# Patient Record
Sex: Male | Born: 1950 | Race: Black or African American | Hispanic: No | Marital: Single | Smoking: Current every day smoker
Health system: Southern US, Community
[De-identification: ages and names within clinical notes are randomized; demographics above are authoritative.]

## PROBLEM LIST (undated history)

## (undated) DIAGNOSIS — F1020 Alcohol dependence, uncomplicated: Secondary | ICD-10-CM

## (undated) DIAGNOSIS — J449 Chronic obstructive pulmonary disease, unspecified: Secondary | ICD-10-CM

## (undated) DIAGNOSIS — I10 Essential (primary) hypertension: Secondary | ICD-10-CM

## (undated) DIAGNOSIS — K219 Gastro-esophageal reflux disease without esophagitis: Secondary | ICD-10-CM

## (undated) DIAGNOSIS — C3490 Malignant neoplasm of unspecified part of unspecified bronchus or lung: Secondary | ICD-10-CM

## (undated) DIAGNOSIS — H9190 Unspecified hearing loss, unspecified ear: Secondary | ICD-10-CM

## (undated) DIAGNOSIS — R0902 Hypoxemia: Secondary | ICD-10-CM

## (undated) DIAGNOSIS — E559 Vitamin D deficiency, unspecified: Secondary | ICD-10-CM

## (undated) DIAGNOSIS — I639 Cerebral infarction, unspecified: Secondary | ICD-10-CM

## (undated) DIAGNOSIS — R569 Unspecified convulsions: Secondary | ICD-10-CM

## (undated) DIAGNOSIS — E7800 Pure hypercholesterolemia, unspecified: Secondary | ICD-10-CM

## (undated) DIAGNOSIS — Z5189 Encounter for other specified aftercare: Secondary | ICD-10-CM

## (undated) DIAGNOSIS — T7840XA Allergy, unspecified, initial encounter: Secondary | ICD-10-CM

## (undated) HISTORY — DX: Hypoxemia: R09.02

## (undated) HISTORY — PX: OTHER SURGICAL HISTORY: SHX169

## (undated) HISTORY — PX: ESOPHAGOGASTRODUODENOSCOPY: SHX1529

## (undated) HISTORY — PX: COLON SURGERY: SHX602

## (undated) HISTORY — DX: Encounter for other specified aftercare: Z51.89

## (undated) HISTORY — PX: CARDIAC VALVE REPLACEMENT: SHX585

## (undated) HISTORY — DX: Allergy, unspecified, initial encounter: T78.40XA

---

## 2008-04-18 ENCOUNTER — Other Ambulatory Visit: Payer: Self-pay

## 2008-04-18 ENCOUNTER — Inpatient Hospital Stay: Payer: Self-pay | Admitting: Internal Medicine

## 2008-04-22 ENCOUNTER — Other Ambulatory Visit: Payer: Self-pay

## 2008-04-22 ENCOUNTER — Inpatient Hospital Stay: Payer: Self-pay | Admitting: Internal Medicine

## 2008-07-11 ENCOUNTER — Ambulatory Visit: Payer: Self-pay | Admitting: Vascular Surgery

## 2011-07-19 ENCOUNTER — Ambulatory Visit: Payer: Self-pay | Admitting: Unknown Physician Specialty

## 2011-07-21 LAB — PATHOLOGY REPORT

## 2011-08-04 ENCOUNTER — Ambulatory Visit: Payer: Self-pay | Admitting: Surgery

## 2011-08-06 ENCOUNTER — Ambulatory Visit: Payer: Self-pay | Admitting: Anesthesiology

## 2011-08-11 ENCOUNTER — Inpatient Hospital Stay: Payer: Self-pay | Admitting: Surgery

## 2011-12-06 ENCOUNTER — Ambulatory Visit: Payer: Self-pay

## 2011-12-30 ENCOUNTER — Ambulatory Visit: Payer: Self-pay | Admitting: Internal Medicine

## 2012-01-26 ENCOUNTER — Ambulatory Visit: Payer: Self-pay | Admitting: Internal Medicine

## 2012-01-26 LAB — PROTIME-INR: Prothrombin Time: 13 secs (ref 11.5–14.7)

## 2012-01-26 LAB — PLATELET COUNT: Platelet: 369 10*3/uL (ref 150–440)

## 2012-02-03 LAB — PATHOLOGY REPORT

## 2012-02-15 ENCOUNTER — Ambulatory Visit: Payer: Self-pay | Admitting: Oncology

## 2012-02-21 ENCOUNTER — Ambulatory Visit: Payer: Self-pay | Admitting: Oncology

## 2012-03-15 ENCOUNTER — Other Ambulatory Visit: Payer: Self-pay

## 2012-03-15 ENCOUNTER — Other Ambulatory Visit: Payer: Self-pay | Admitting: Cardiothoracic Surgery

## 2012-03-15 DIAGNOSIS — C3490 Malignant neoplasm of unspecified part of unspecified bronchus or lung: Secondary | ICD-10-CM

## 2012-03-23 ENCOUNTER — Ambulatory Visit: Payer: Self-pay | Admitting: Oncology

## 2012-03-30 ENCOUNTER — Ambulatory Visit
Admission: RE | Admit: 2012-03-30 | Discharge: 2012-03-30 | Disposition: A | Discharge status: Home / Self Care | Payer: Medicaid Other | Source: Ambulatory Visit | Attending: Cardiothoracic Surgery | Admitting: Cardiothoracic Surgery

## 2012-03-30 DIAGNOSIS — C3490 Malignant neoplasm of unspecified part of unspecified bronchus or lung: Secondary | ICD-10-CM

## 2012-04-20 ENCOUNTER — Other Ambulatory Visit (HOSPITAL_COMMUNITY): Payer: Self-pay | Admitting: Interventional Radiology

## 2012-04-20 DIAGNOSIS — R918 Other nonspecific abnormal finding of lung field: Secondary | ICD-10-CM

## 2012-04-25 ENCOUNTER — Other Ambulatory Visit (HOSPITAL_COMMUNITY): Payer: Medicaid Other

## 2012-04-26 ENCOUNTER — Other Ambulatory Visit: Payer: Self-pay | Admitting: Radiology

## 2012-04-28 ENCOUNTER — Ambulatory Visit (HOSPITAL_COMMUNITY)
Admission: RE | Admit: 2012-04-28 | Discharge: 2012-04-28 | Disposition: A | Discharge status: Home / Self Care | Payer: Medicaid Other | Source: Ambulatory Visit | Attending: Interventional Radiology | Admitting: Interventional Radiology

## 2012-04-28 ENCOUNTER — Encounter (HOSPITAL_COMMUNITY): Payer: Self-pay

## 2012-04-28 ENCOUNTER — Encounter (HOSPITAL_COMMUNITY)
Admission: RE | Admit: 2012-04-28 | Discharge: 2012-04-28 | Disposition: A | Discharge status: Home / Self Care | Payer: Medicaid Other | Source: Ambulatory Visit | Attending: Interventional Radiology | Admitting: Interventional Radiology

## 2012-04-28 ENCOUNTER — Encounter (HOSPITAL_COMMUNITY): Payer: Self-pay | Admitting: Pharmacy Technician

## 2012-04-28 DIAGNOSIS — R222 Localized swelling, mass and lump, trunk: Secondary | ICD-10-CM | POA: Insufficient documentation

## 2012-04-28 DIAGNOSIS — Z01812 Encounter for preprocedural laboratory examination: Secondary | ICD-10-CM | POA: Insufficient documentation

## 2012-04-28 DIAGNOSIS — M948X9 Other specified disorders of cartilage, unspecified sites: Secondary | ICD-10-CM | POA: Insufficient documentation

## 2012-04-28 DIAGNOSIS — R918 Other nonspecific abnormal finding of lung field: Secondary | ICD-10-CM

## 2012-04-28 DIAGNOSIS — I2510 Atherosclerotic heart disease of native coronary artery without angina pectoris: Secondary | ICD-10-CM | POA: Insufficient documentation

## 2012-04-28 DIAGNOSIS — Z01810 Encounter for preprocedural cardiovascular examination: Secondary | ICD-10-CM | POA: Insufficient documentation

## 2012-04-28 DIAGNOSIS — R911 Solitary pulmonary nodule: Secondary | ICD-10-CM | POA: Insufficient documentation

## 2012-04-28 HISTORY — DX: Malignant neoplasm of unspecified part of unspecified bronchus or lung: C34.90

## 2012-04-28 HISTORY — DX: Pure hypercholesterolemia, unspecified: E78.00

## 2012-04-28 HISTORY — DX: Chronic obstructive pulmonary disease, unspecified: J44.9

## 2012-04-28 HISTORY — DX: Cerebral infarction, unspecified: I63.9

## 2012-04-28 HISTORY — DX: Unspecified convulsions: R56.9

## 2012-04-28 HISTORY — DX: Essential (primary) hypertension: I10

## 2012-04-28 LAB — CBC
HCT: 40.4 % (ref 39.0–52.0)
MCH: 30.4 pg (ref 26.0–34.0)
MCHC: 34.4 g/dL (ref 30.0–36.0)
MCV: 88.4 fL (ref 78.0–100.0)
RDW: 13.8 % (ref 11.5–15.5)

## 2012-04-28 LAB — BASIC METABOLIC PANEL
BUN: 6 mg/dL (ref 6–23)
Chloride: 99 mEq/L (ref 96–112)
Creatinine, Ser: 0.56 mg/dL (ref 0.50–1.35)
GFR calc Af Amer: >90 mL/min (ref >90)
Glucose, Bld: 104 mg/dL — ABNORMAL HIGH (ref 70–99)

## 2012-04-28 NOTE — Patient Instructions (Signed)
YOUR SURGERY IS SCHEDULED ON:  Friday  9/13  AT 8:00 AM  REPORT TO [facility] AT:  6:00 AM      PHONE # FOR SHORT STAY IS [phone]  STOP PLAVIX  TODAY -Friday  04/28/12 STOP ASPIRIN Monday  05/01/12 ABOVE INSTRUCTIONS FOR STOPPING PLAVIX AND ASPIRIN FROM TINA IN INTERVENTIONAL RADIOLOGY FOR DR. YAMAGATA.  DO NOT EAT OR DRINK ANYTHING AFTER MIDNIGHT THE NIGHT BEFORE YOUR SURGERY.  YOU MAY BRUSH YOUR TEETH, RINSE OUT YOUR MOUTH--BUT NO WATER, NO FOOD, NO CHEWING GUM, NO MINTS, NO CANDIES, NO CHEWING TOBACCO.  PLEASE TAKE THE FOLLOWING MEDICATIONS THE AM OF YOUR SURGERY WITH A FEW SIPS OF WATER: PANTOPRAZOLE    DO NOT BRING VALUABLES, MONEY, CREDIT CARDS.  CONTACT LENS, DENTURES / PARTIALS, GLASSES SHOULD NOT BE WORN TO SURGERY AND IN MOST CASES-HEARING AIDS WILL NEED TO BE REMOVED.  BRING YOUR GLASSES CASE, ANY EQUIPMENT NEEDED FOR YOUR CONTACT LENS. FOR PATIENTS ADMITTED TO THE HOSPITAL--CHECK OUT TIME THE DAY OF DISCHARGE IS 11:00 AM.  ALL INPATIENT ROOMS ARE PRIVATE - WITH BATHROOM, TELEPHONE, TELEVISION AND WIFI INTERNET. IF YOU ARE BEING DISCHARGED THE SAME DAY OF YOUR SURGERY--YOU CAN NOT DRIVE YOURSELF HOME--AND SHOULD NOT GO HOME ALONE BY TAXI OR BUS.  NO DRIVING OR OPERATING MACHINERY FOR 24 HOURS FOLLOWING ANESTHESIA / PAIN MEDICATIONS.                            SPECIAL INSTRUCTIONS:  CHLORHEXIDINE SOAP SHOWER (other brand names are Betasept and Hibiclens ) PLEASE SHOWER WITH CHLORHEXIDINE THE NIGHT BEFORE YOUR SURGERY AND THE AM OF YOUR SURGERY. DO NOT USE CHLORHEXIDINE ON YOUR FACE OR PRIVATE AREAS--YOU MAY USE YOUR NORMAL SOAP THOSE AREAS AND YOUR NORMAL SHAMPOO.  WOMEN SHOULD AVOID SHAVING UNDER ARMS AND SHAVING LEGS 48 HOURS BEFORE USING CHLORHEXIDINE TO AVOID SKIN IRRITATION.  DO NOT USE IF ALLERGIC TO CHLORHEXIDINE.  PLEASE READ OVER ANY  FACT SHEETS THAT YOU WERE GIVEN:  BLOOD TRANSFUSION INFORMATION

## 2012-04-28 NOTE — Pre-Procedure Instructions (Signed)
PREOP CBC, BMET, PT, PTT WERE DONE TODAY AT [facility]. WE HAD REQUESTED ANY RECENT EKG REPORT FROM PT'S MEDICAL DOCTOR'S OFFICE AND FROM [facility]--THE ONLY EKG RECEIVED WAS FROM MEDICAL DOCTOR'S OFFICE -IT WAS FROM 2009.  EKG REPEATED TODAY PREOP.  PT IS HAVING CHEST CT THIS AFTERNOON AT [facility]. PREOP INSTRUCTIONS DISCUSSED WITH PT USING TEACH BACK METHOD. AFTER PT LEFT PRESURGICAL [facility] FAXED AN EKG REPORT FROM 08/06/11.

## 2012-05-01 ENCOUNTER — Other Ambulatory Visit: Payer: Self-pay | Admitting: Radiology

## 2012-05-05 ENCOUNTER — Encounter (HOSPITAL_COMMUNITY): Payer: Self-pay | Admitting: Anesthesiology

## 2012-05-05 ENCOUNTER — Ambulatory Visit (HOSPITAL_COMMUNITY): Payer: Medicaid Other

## 2012-05-05 ENCOUNTER — Ambulatory Visit (HOSPITAL_COMMUNITY)
Admission: RE | Admit: 2012-05-05 | Discharge: 2012-05-05 | Disposition: A | Discharge status: Home / Self Care | Payer: Medicaid Other | Source: Ambulatory Visit | Attending: Interventional Radiology | Admitting: Interventional Radiology

## 2012-05-05 ENCOUNTER — Ambulatory Visit (HOSPITAL_COMMUNITY): Payer: Medicaid Other | Admitting: Anesthesiology

## 2012-05-05 ENCOUNTER — Encounter (HOSPITAL_COMMUNITY)
Admission: RE | Disposition: A | Discharge status: Home / Self Care | Payer: Self-pay | Source: Ambulatory Visit | Attending: Interventional Radiology

## 2012-05-05 ENCOUNTER — Encounter (HOSPITAL_COMMUNITY): Payer: Self-pay | Admitting: *Deleted

## 2012-05-05 ENCOUNTER — Encounter (HOSPITAL_COMMUNITY): Payer: Self-pay

## 2012-05-05 ENCOUNTER — Observation Stay (HOSPITAL_COMMUNITY)
Admission: RE | Admit: 2012-05-05 | Discharge: 2012-05-07 | Disposition: A | Discharge status: Home / Self Care | Payer: Medicaid Other | Source: Ambulatory Visit | Attending: Interventional Radiology | Admitting: Interventional Radiology

## 2012-05-05 DIAGNOSIS — I10 Essential (primary) hypertension: Secondary | ICD-10-CM | POA: Insufficient documentation

## 2012-05-05 DIAGNOSIS — R918 Other nonspecific abnormal finding of lung field: Secondary | ICD-10-CM

## 2012-05-05 DIAGNOSIS — Z79899 Other long term (current) drug therapy: Secondary | ICD-10-CM | POA: Insufficient documentation

## 2012-05-05 DIAGNOSIS — J95811 Postprocedural pneumothorax: Secondary | ICD-10-CM | POA: Insufficient documentation

## 2012-05-05 DIAGNOSIS — Y849 Medical procedure, unspecified as the cause of abnormal reaction of the patient, or of later complication, without mention of misadventure at the time of the procedure: Secondary | ICD-10-CM | POA: Insufficient documentation

## 2012-05-05 DIAGNOSIS — C3410 Malignant neoplasm of upper lobe, unspecified bronchus or lung: Principal | ICD-10-CM | POA: Insufficient documentation

## 2012-05-05 SURGERY — RADIO FREQUENCY ABLATION
Anesthesia: General | Laterality: Left | Wound class: Clean

## 2012-05-05 MED ORDER — LIDOCAINE HCL (CARDIAC) 20 MG/ML IV SOLN
INTRAVENOUS | Status: DC | PRN
  Administered 2012-05-05: 50 mg via INTRAVENOUS

## 2012-05-05 MED ORDER — ALBUTEROL SULFATE HFA 108 (90 BASE) MCG/ACT IN AERS
INHALATION_SPRAY | RESPIRATORY_TRACT | Status: AC
  Filled 2012-05-05: qty 6.7

## 2012-05-05 MED ORDER — PROPOFOL 10 MG/ML IV BOLUS
INTRAVENOUS | Status: DC | PRN
  Administered 2012-05-05: 150 mg via INTRAVENOUS

## 2012-05-05 MED ORDER — ATORVASTATIN CALCIUM 40 MG PO TABS
40.0000 mg | ORAL_TABLET | Freq: Every day | ORAL | Status: DC
  Administered 2012-05-05 – 2012-05-06 (×2): 40 mg via ORAL
  Filled 2012-05-05 (×3): qty 1

## 2012-05-05 MED ORDER — ONDANSETRON HCL 4 MG/2ML IJ SOLN: 4.0000 mg | Freq: Four times a day (QID) | INTRAMUSCULAR | Status: DC | PRN

## 2012-05-05 MED ORDER — HYDROMORPHONE HCL PF 1 MG/ML IJ SOLN
INTRAMUSCULAR | Status: AC
  Filled 2012-05-05: qty 1

## 2012-05-05 MED ORDER — CEFAZOLIN SODIUM-DEXTROSE 2-3 GM-% IV SOLR
2.0000 g | INTRAVENOUS | Status: AC
  Administered 2012-05-05: 2 g via INTRAVENOUS
  Filled 2012-05-05: qty 50

## 2012-05-05 MED ORDER — HYDROCODONE-ACETAMINOPHEN 5-325 MG PO TABS
1.0000 | ORAL_TABLET | ORAL | Status: DC | PRN
  Administered 2012-05-05: 2 via ORAL
  Filled 2012-05-05: qty 2

## 2012-05-05 MED ORDER — MIDAZOLAM HCL 5 MG/5ML IJ SOLN
INTRAMUSCULAR | Status: DC | PRN
  Administered 2012-05-05: 1 mg via INTRAVENOUS
  Administered 2012-05-05: 2 mg via INTRAVENOUS
  Administered 2012-05-05: 1 mg via INTRAVENOUS

## 2012-05-05 MED ORDER — LACTATED RINGERS IV SOLN
INTRAVENOUS | Status: DC
  Administered 2012-05-05: 07:00:00 via INTRAVENOUS

## 2012-05-05 MED ORDER — ACETAMINOPHEN 10 MG/ML IV SOLN
INTRAVENOUS | Status: AC
  Filled 2012-05-05: qty 100

## 2012-05-05 MED ORDER — LORAZEPAM 2 MG/ML IJ SOLN
2.0000 mg | Freq: Once | INTRAMUSCULAR | Status: AC
  Administered 2012-05-05: 2 mg via INTRAVENOUS
  Filled 2012-05-05: qty 1

## 2012-05-05 MED ORDER — GLYCOPYRROLATE 0.2 MG/ML IJ SOLN
INTRAMUSCULAR | Status: DC | PRN
  Administered 2012-05-05: 0.3 mg via INTRAVENOUS

## 2012-05-05 MED ORDER — INFLUENZA VIRUS VACC SPLIT PF IM SUSP
0.5000 mL | INTRAMUSCULAR | Status: AC
  Administered 2012-05-06: 0.5 mL via INTRAMUSCULAR
  Filled 2012-05-05: qty 0.5

## 2012-05-05 MED ORDER — NEOSTIGMINE METHYLSULFATE 1 MG/ML IJ SOLN
INTRAMUSCULAR | Status: DC | PRN
  Administered 2012-05-05: 2 mg via INTRAVENOUS

## 2012-05-05 MED ORDER — PHENYTOIN SODIUM EXTENDED 100 MG PO CAPS
400.0000 mg | ORAL_CAPSULE | Freq: Every day | ORAL | Status: DC
  Administered 2012-05-05 – 2012-05-06 (×2): 400 mg via ORAL
  Filled 2012-05-05 (×3): qty 4

## 2012-05-05 MED ORDER — PANTOPRAZOLE SODIUM 40 MG PO TBEC
40.0000 mg | DELAYED_RELEASE_TABLET | Freq: Every day | ORAL | Status: DC
  Administered 2012-05-06 – 2012-05-07 (×2): 40 mg via ORAL
  Filled 2012-05-05 (×2): qty 1

## 2012-05-05 MED ORDER — ACETAMINOPHEN 10 MG/ML IV SOLN
INTRAVENOUS | Status: DC | PRN
  Administered 2012-05-05: 1000 mg via INTRAVENOUS

## 2012-05-05 MED ORDER — DOCUSATE SODIUM 100 MG PO CAPS
100.0000 mg | ORAL_CAPSULE | Freq: Two times a day (BID) | ORAL | Status: DC
  Administered 2012-05-05 – 2012-05-07 (×4): 100 mg via ORAL
  Filled 2012-05-05 (×5): qty 1

## 2012-05-05 MED ORDER — SENNOSIDES-DOCUSATE SODIUM 8.6-50 MG PO TABS
1.0000 | ORAL_TABLET | Freq: Every day | ORAL | Status: DC | PRN
  Filled 2012-05-05: qty 1

## 2012-05-05 MED ORDER — LACTATED RINGERS IV SOLN
INTRAVENOUS | Status: DC | PRN
  Administered 2012-05-05 (×2): via INTRAVENOUS

## 2012-05-05 MED ORDER — ONDANSETRON HCL 4 MG/2ML IJ SOLN
INTRAMUSCULAR | Status: DC | PRN
  Administered 2012-05-05: 4 mg via INTRAVENOUS

## 2012-05-05 MED ORDER — HYDRALAZINE HCL 20 MG/ML IJ SOLN
5.0000 mg | Freq: Four times a day (QID) | INTRAMUSCULAR | Status: DC | PRN
  Filled 2012-05-05: qty 1

## 2012-05-05 MED ORDER — ROCURONIUM BROMIDE 100 MG/10ML IV SOLN
INTRAVENOUS | Status: DC | PRN
  Administered 2012-05-05: 10 mg via INTRAVENOUS
  Administered 2012-05-05: 40 mg via INTRAVENOUS
  Administered 2012-05-05: 20 mg via INTRAVENOUS
  Administered 2012-05-05: 50 mg via INTRAVENOUS

## 2012-05-05 MED ORDER — LISINOPRIL 10 MG PO TABS
10.0000 mg | ORAL_TABLET | Freq: Every day | ORAL | Status: DC
  Administered 2012-05-05 – 2012-05-07 (×3): 10 mg via ORAL
  Filled 2012-05-05 (×3): qty 1

## 2012-05-05 MED ORDER — HYDROMORPHONE HCL PF 1 MG/ML IJ SOLN
0.2500 mg | INTRAMUSCULAR | Status: DC | PRN
  Administered 2012-05-05 (×2): 0.5 mg via INTRAVENOUS

## 2012-05-05 MED ORDER — KETOROLAC TROMETHAMINE 30 MG/ML IJ SOLN
30.0000 mg | Freq: Once | INTRAMUSCULAR | Status: AC
  Administered 2012-05-05: 30 mg via INTRAVENOUS

## 2012-05-05 MED ORDER — METHYLPREDNISOLONE SODIUM SUCC 125 MG IJ SOLR
INTRAMUSCULAR | Status: AC
  Filled 2012-05-05: qty 2

## 2012-05-05 MED ORDER — KETOROLAC TROMETHAMINE 30 MG/ML IJ SOLN
INTRAMUSCULAR | Status: AC
  Filled 2012-05-05: qty 1

## 2012-05-05 MED ORDER — FERROUS SULFATE 325 (65 FE) MG PO TABS
325.0000 mg | ORAL_TABLET | Freq: Every day | ORAL | Status: DC
  Administered 2012-05-06 – 2012-05-07 (×2): 325 mg via ORAL
  Filled 2012-05-05 (×2): qty 1

## 2012-05-05 MED ORDER — PROMETHAZINE HCL 25 MG/ML IJ SOLN: 6.2500 mg | INTRAMUSCULAR | Status: DC | PRN

## 2012-05-05 MED ORDER — FENTANYL CITRATE 0.05 MG/ML IJ SOLN
INTRAMUSCULAR | Status: DC | PRN
  Administered 2012-05-05 (×5): 50 ug via INTRAVENOUS
  Administered 2012-05-05: 100 ug via INTRAVENOUS

## 2012-05-05 NOTE — Anesthesia Postprocedure Evaluation (Signed)
  Anesthesia Post-op Note  Patient: Derek Webb  Procedure(s) Performed: Procedure(s) (LRB): RADIO FREQUENCY ABLATION (Left)  Patient Location: PACU  Anesthesia Type: General  Level of Consciousness: awake and alert   Airway and Oxygen Therapy: Patient Spontanous Breathing  Post-op Pain: mild  Post-op Assessment: Post-op Vital signs reviewed, Patient's Cardiovascular Status Stable, Respiratory Function Stable, Patent Airway and No signs of Nausea or vomiting  Post-op Vital Signs: stable  Complications: No apparent anesthesia complications

## 2012-05-05 NOTE — Progress Notes (Signed)
Unable to reach on call radiologist who could give order for bp meds.  Called Elink for elevated BP. Barnett Hatter P

## 2012-05-05 NOTE — Transfer of Care (Signed)
Immediate Anesthesia Transfer of Care Note  Patient: Derek Webb  Procedure(s) Performed: Procedure(s) (LRB) with comments: RADIO FREQUENCY ABLATION (Left) - Lung Microwave Ablation  Patient Location: PACU  Anesthesia Type: General  Level of Consciousness: pateint uncooperative and confused  Airway & Oxygen Therapy: Patient Spontanous Breathing and Patient connected to face mask oxygen  Post-op Assessment: Report given to PACU RN, Post -op Vital signs reviewed and stable and Patient moving all extremities X 4  Post vital signs: Reviewed and stable  Complications: No apparent anesthesia complications

## 2012-05-05 NOTE — Progress Notes (Signed)
CARE MANAGEMENT NOTE 05/05/2012  Patient:  Derek Webb, Derek Webb   Account Number:  [id number]  Date Initiated:  05/05/2012  Documentation initiated by:  Whitaker Holderman  Subjective/Objective Assessment:   pt with microwave ablation of lung due to ca, high risk of bleeding and pneumothorax,cardiac monitoring post op     Action/Plan:   from home   Anticipated DC Date:  05/08/2012   Anticipated DC Plan:  HOME/SELF CARE  In-house referral  NA      DC Planning Services  NA      [facility] Choice  NA   Choice offered to / List presented to:  NA   DME arranged  NA      DME agency  NA     HH arranged  NA      HH agency  NA   Status of service:  In process, will continue to follow Medicare Important Message given?  NA - LOS <3 / Initial given by admissions (If response is "NO", the following Medicare IM given date fields will be blank) Date Medicare IM given:   Date Additional Medicare IM given:    Discharge Disposition:    Per UR Regulation:  Reviewed for med. necessity/level of care/duration of stay  If discussed at Long Length of Stay Meetings, dates discussed:    Comments:  [id number]/Weda Baumgarner Earlene Plater, RN, BSN, CCM: CHART REVIEWED AND UPDATED. NO DISCHARGE NEEDS PRESENT AT THIS TIME. CASE MANAGEMENT [phone]

## 2012-05-05 NOTE — H&P (Signed)
Agree.  For LUL lung ablation today.

## 2012-05-05 NOTE — Significant Event (Signed)
Pt noted to have elevated BP.  Will give prn hydralazine.  Coralyn Helling, MD 05/05/2012, 8:25 PM

## 2012-05-05 NOTE — Progress Notes (Addendum)
Day of Surgery  Subjective: Pt doing well; denies CP, dyspnea, hemoptysis  Objective: Vital signs in last 24 hours: Temp:  [97.7 F (36.5 C)-98.1 F (36.7 C)] 97.9 F (36.6 C) (09/13 1240) Pulse Rate:  [68-84] 73  (09/13 1215) Resp:  [14-23] 14  (09/13 1215) BP: (144-179)/(89-115) 179/96 mmHg (09/13 1240) SpO2:  [98 %-100 %] 99 % (09/13 1215) Weight:  [195 lb 15.8 oz (88.9 kg)] 195 lb 15.8 oz (88.9 kg) (09/13 1240) Last BM Date: 05/04/12  Intake/Output from previous day:   Intake/Output this shift: Total I/O In: 1500 [I.V.:1500] Out: 610 [Urine:600; Blood:10]  Pt awake/alert; chest- sl dim BS left upper lobe, otherwise clear; heart-RRR, abd- soft,+BS,NT; ext with FROM, no edema; left chest tube intact, dressing dry,  minimal pleural fluid noted, few bubbles in pleuravac with coughing indicative of tiny air leak  Lab Results:  No results found for this basename: WBC:2,HGB:2,HCT:2,PLT:2 in the last 72 hours BMET No results found for this basename: NA:2,K:2,CL:2,CO2:2,GLUCOSE:2,BUN:2,CREATININE:2,CALCIUM:2 in the last 72 hours PT/INR No results found for this basename: LABPROT:2,INR:2 in the last 72 hours ABG No results found for this basename: PHART:2,PCO2:2,PO2:2,HCO3:2 in the last 72 hours  Studies/Results: Ct Guided Abscess Drain  05/05/2012  *RADIOLOGY REPORT*  Clinical Data:  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  The patient is not an operative candidate and presents for percutaneous ablation.  1.  CT-GUIDED PERCUTANEOUS MICROWAVE ABLATION OF LEFT UPPER LOBE LUNG CARCINOMA. 2.  CT GUIDED LEFT TUBE THORACOSTOMY  Anesthesia:  General  Medications:  2 grams IV Ancef.  As antibiotic prophylaxis, Ancef was ordered pre-procedure and administered intravenously within one hour of incision.  Contrast:  None  Procedure:  The procedure, risks, benefits, and alternatives were explained to the patient.  Questions regarding the procedure were encouraged and answered.  The  patient understands and consents to the procedure.  The patient was placed under general anesthesia.  Initial unenhanced CT was performed in a supine position to localize the left upper lobe tumor.  The left anterior chest wall was prepped with Betadine in a sterile fashion, and a sterile drape was applied covering the operative field.  A sterile gown and sterile gloves were used for the procedure.  Under CT guidance, a series of two parallel 15 cm length NeuWave PR microwave probes were advanced into the tumor.  Probe positioning and depth were confirmed by CT.  Microwave ablation was performed through the two probes simultaneously for a 10 minutes cycle.  Initial power was set at 40 Watts and increased to 65 Watts after 2 minutes.  Post-procedural CT was performed.  Complications: Pneumothorax.  Findings:  The left upper lobe squamous carcinoma measures approximately 2.2 x 2.9 cm.  From an anterior approach, parallel microwave ablation probes were positioned in the lesion.  During probe placement, the patient sustained a small pneumothorax.  The ablation was uncomplicated.  Due to slight increase in size of the pneumothorax during ablation with development of a small air-fluid level in the lateral pleural space, decision was made to place a chest tube.  A 12-French pigtail drain was advanced over a guidewire into the lateral pleural space.  Catheter position was confirmed by CT.  The tube was connected to a Sahara Pleur-Evac device and connected to wall suction at a pressure of minus 20 cm of water.  IMPRESSION: CT guided percutaneous microwave ablation of a left upper lobe squamous carcinoma. The procedure was complicated by pneumothorax which was treated with left tube  thoracostomy with placement of a 12-French pigtail drain in the pleural space.  This will be connected to wall suction and followed by chest x-ray.  The patient will be admitted for overnight observation.   Original Report Authenticated By: Reola Calkins, M.D.    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  The patient is not an operative candidate and presents for percutaneous ablation.  1.  CT-GUIDED PERCUTANEOUS MICROWAVE ABLATION OF LEFT UPPER LOBE LUNG CARCINOMA. 2.  CT GUIDED LEFT TUBE THORACOSTOMY  Anesthesia:  General  Medications:  2 grams IV Ancef.  As antibiotic prophylaxis, Ancef was ordered pre-procedure and administered intravenously within one hour of incision.  Contrast:  None  Procedure:  The procedure, risks, benefits, and alternatives were explained to the patient.  Questions regarding the procedure were encouraged and answered.  The patient understands and consents to the procedure.  The patient was placed under general anesthesia.  Initial unenhanced CT was performed in a supine position to localize the left upper lobe tumor.  The left anterior chest wall was prepped with Betadine in a sterile fashion, and a sterile drape was applied covering the operative field.  A sterile gown and sterile gloves were used for the procedure.  Under CT guidance, a series of two parallel 15 cm length NeuWave PR microwave probes were advanced into the tumor.  Probe positioning and depth were confirmed by CT.  Microwave ablation was performed through the two probes simultaneously for a 10 minutes cycle.  Initial power was set at 40 Watts and increased to 65 Watts after 2 minutes.  Post-procedural CT was performed.  Complications: Pneumothorax.  Findings:  The left upper lobe squamous carcinoma measures approximately 2.2 x 2.9 cm.  From an anterior approach, parallel microwave ablation probes were positioned in the lesion.  During probe placement, the patient sustained a small pneumothorax.  The ablation was uncomplicated.  Due to slight increase in size of the pneumothorax during ablation with development of a small air-fluid level in the lateral pleural space,  decision was made to place a chest tube.  A 12-French pigtail drain was advanced over a guidewire into the lateral pleural space.  Catheter position was confirmed by CT.  The tube was connected to a Sahara Pleur-Evac device and connected to wall suction at a pressure of minus 20 cm of water.  IMPRESSION: CT guided percutaneous microwave ablation of a left upper lobe squamous carcinoma. The procedure was complicated by pneumothorax which was treated with left tube thoracostomy with placement of a 12-French pigtail drain in the pleural space.  This will be connected to wall suction and followed by chest x-ray.  The patient will be admitted for overnight observation.   Original Report Authenticated By: Reola Calkins, M.D.    Dg Chest Port 1 View  05/05/2012  *RADIOLOGY REPORT*  Clinical Data: Left chest tube after left pneumothorax  PORTABLE CHEST - 1 VIEW  Comparison: CT chest from today as well as CT chest of 04/28/2012  Findings: A small bore left chest tube has been placed and the small left pneumothorax noted on CT of the chest is not visualized. Opacity surrounds the left upper lobe lung nodule consistent with post biopsy hemorrhage.  No pleural effusion is seen. There is mild atelectasis at the right lung base.  The heart is mildly enlarged.  IMPRESSION:  1.  Left chest tube present with no  definite left pneumothorax. 2.  Haziness around the left upper lobe lung nodule probably represents post biopsy hemorrhage and volume loss.   Original Report Authenticated By: Juline Patch, M.D.     Anti-infectives: Anti-infectives     Start     Dose/Rate Route Frequency Ordered Stop   05/05/12 0645   ceFAZolin (ANCEF) IVPB 2 g/50 mL premix        2 g 100 mL/hr over 30 Minutes Intravenous On call 05/05/12 1610 05/05/12 0915          Assessment/Plan: s/p CT guided microwave ablation LUL lung carcinoma 9/13 with chest tube placement for ptx; check AM CXR,and if no ptx, place tube to water seal and obtain  CXR 3-4 hours later; if no ptx, remove drain and possibly d/c home tomorrow or 9/15.  Follow up with Dr. Fredia Sorrow in IR clinic in 4 weeks.   LOS: 0 days    Theresa Wedel,D [facility] 05/05/2012

## 2012-05-05 NOTE — Procedures (Signed)
Procedure:  Left tube thoracostomy Findings:  Due to persistent PTX after left upper lobe lung ablation, 12 Fr pigtail catheter placed in left pleural space from anterior approach.  After wall suction, PTX nearly resolved.  Will follow with CXR in PACU and in AM.

## 2012-05-05 NOTE — Preoperative (Signed)
Beta Blockers   Reason not to administer Beta Blockers:Not Applicable 

## 2012-05-05 NOTE — Anesthesia Preprocedure Evaluation (Addendum)
Anesthesia Evaluation  Patient identified by MRN, date of birth, ID band Patient awake    Reviewed: Allergy & Precautions, H&P , NPO status , Patient's Chart, lab work & pertinent test results  Airway Mallampati: II TM Distance: >3 FB Neck ROM: Full    Dental No notable dental hx.    Pulmonary COPD oxygen dependent, Current Smoker, former smoker,    + decreased breath sounds      Cardiovascular hypertension, Pt. on medications Rhythm:Regular Rate:Normal     Neuro/Psych Seizures -, Well Controlled,  CVA negative psych ROS   GI/Hepatic negative GI ROS, (+)     substance abuse  alcohol use,   Endo/Other  negative endocrine ROS  Renal/GU negative Renal ROS  negative genitourinary   Musculoskeletal negative musculoskeletal ROS (+)   Abdominal   Peds negative pediatric ROS (+)  Hematology negative hematology ROS (+)   Anesthesia Other Findings   Reproductive/Obstetrics negative OB ROS                          Anesthesia Physical Anesthesia Plan  ASA: III  Anesthesia Plan: General   Post-op Pain Management:    Induction: Intravenous  Airway Management Planned: Oral ETT  Additional Equipment:   Intra-op Plan:   Post-operative Plan: Extubation in OR and Possible Post-op intubation/ventilation  Informed Consent: I have reviewed the patients History and Physical, chart, labs and discussed the procedure including the risks, benefits and alternatives for the proposed anesthesia with the patient or authorized representative who has indicated his/her understanding and acceptance.   Dental advisory given  Plan Discussed with: CRNA and Surgeon  Anesthesia Plan Comments:         Anesthesia Quick Evaluation

## 2012-05-05 NOTE — Procedures (Signed)
Procedure:  CT guided microwave ablation of LUL lung carcinoma Anesthesia:  General Findings:  2.8 cm LUL carcinoma treated with MWA with insertion of 2 NeuWave PR probes and MWA for 10 minutes. Complications:  Small PTX.  Treated with aspiration. Plan:  PACU recovery and overnight observation.  CXR follow up.

## 2012-05-05 NOTE — H&P (Signed)
Chief Complaint: Lung cancer Referring Physician:[facility] HPI: Derek Webb is an 61 y.o. male who was diagnosed with LUL lung cancer by perc biopsy. He is determined not to be a surgical candidate and was referred to Dr. Fredia Sorrow for consideration of percutaneous microwave ablation procedure. After consultation, the pt has elected to proceed with this procedure. He was advised to hold his Plavix and his last dose was on 9/9.  He otherwise feels ok, no new c/o, no recent illnesses or infections since being seen in clinic. Pre-assessment was completed.  Past Medical History:  Past Medical History  Diagnosis Date  . Stroke     2 TO 3 YRS AGO-AFFECTED HIS SPEECH--ABLE TO AMBULATE WITHOUT ASSIST AND DOES YARD WORK.  DECREASED HEARING IN BOTH EARS SINCE STROKE.  Marland Kitchen Elevated cholesterol   . Hypertension   . Seizures     CHRONIC DILATIN - PT STATES NO SEIZURES IN PAST COUPLE OF YEARS; PT STATES HIS SEIZURES CAUSED NUMBNESS OF ARMS AND HANDS AND NOT ABLE TO MOVE HIS ARMS  . COPD (chronic obstructive pulmonary disease)     SEVERE COPD -OCCASIONALLY USES OXYGEN AT NIGHT-DOES NOT USE INHALERS ON REGULAR BASIS  . Lung cancer     LEFT UPPER LUNG CARCINOMA-NOT A CANDIDATE FOR SURGICAL RESECTION BECAUSE OF HIS COPD AND POOR CANDIDATE FOR RADIATION BECAUSE OF TRANSPORTATION PROBLEMS    Past Surgical History:  Past Surgical History  Procedure Date  . Colon polyp excision   . Colon surgery   . Carotid surgery 2009 -left     Family History: No family history on file.  Social History:  reports that he quit smoking about 4 months ago. His smoking use included Cigarettes. He has a 35 pack-year smoking history. He has never used smokeless tobacco. He reports that he drinks alcohol. He reports that he does not use illicit drugs.  Allergies: No Known Allergies  Medications: aspirin 325 MG tablet (Taking) Sig - Route: Take 325 mg by mouth daily. - Oral Class: Historical Med atorvastatin (LIPITOR) 40 MG  tablet (Taking) Sig - Route: Take 40 mg by mouth at bedtime. - Oral Class: Historical Med clopidogrel (PLAVIX) 75 MG tablet (Taking) Sig - Route: Take 75 mg by mouth daily. - Oral Class: Historical Med pantoprazole (PROTONIX) 40 MG tablet (Taking) Sig - Route: Take 40 mg by mouth daily. IN AM - Oral Class: Historical Med Number of times this order has been changed since signing: 1 Order [address] phenytoin (DILANTIN) 100 MG ER capsule (Taking) Sig - Route: Take 400 mg by mouth at bedtime. - Oral Class: Historical Med quinapril (ACCUPRIL) 10 MG tablet (Taking) Sig - Route: Take 10 mg by mouth at bedtime   Please HPI for pertinent positives, otherwise complete 10 system ROS negative.  Physical Exam: There were no vitals taken for this visit. There is no height or weight on file to calculate BMI.   General Appearance:  Alert, cooperative, no distress, appears stated age  Head:  Normocephalic, without obvious abnormality, atraumatic  ENT: Unremarkable  Neck: Supple, symmetrical, trachea midline, no adenopathy, thyroid: not enlarged, symmetric, no tenderness/mass/nodules  Lungs:   Even BS, few exp wheezes on left.  Chest Wall:  No tenderness or deformity.  Heart:  Regular rate and rhythm, S1, S2 normal, no murmur, rub or gallop. Carotids 2+ without bruit.  Abdomen:   Soft, non-tender, non distended. Bowel sounds active all four quadrants,  no masses, no organomegaly.  Extremities: Extremities normal, atraumatic, no cyanosis or edema  Neurologic: Normal affect, no gross deficits.   Labs: CBC    Component Value Date/Time   WBC 4.2 04/28/2012 1500   RBC 4.57 04/28/2012 1500   HGB 13.9 04/28/2012 1500   HCT 40.4 04/28/2012 1500   PLT 380 04/28/2012 1500   MCV 88.4 04/28/2012 1500   MCH 30.4 04/28/2012 1500   MCHC 34.4 04/28/2012 1500   RDW 13.8 04/28/2012 1500    BMET    Component Value Date/Time   NA 137 04/28/2012 1500   K 4.7 04/28/2012 1500   CL 99 04/28/2012 1500   CO2 32 04/28/2012 1500   GLUCOSE 104*  04/28/2012 1500   BUN 6 04/28/2012 1500   CREATININE 0.56 04/28/2012 1500   CALCIUM 9.7 04/28/2012 1500   GFRNONAA >90 04/28/2012 1500   GFRAA >90 04/28/2012 1500    Prothrombin Time/INR 12.6/0.92  Assessment/Plan (L)upper lung mass, bx positive for carcinoma For CT guided perc microwave ablation today under general anesthesia. Reviewed procedure details, including risks, complications, and recovery expectations. Anticipating 23hr observation with possibility of longer admission. Consent signed in chart.  Brayton El PA-C 05/05/2012, 7:46 AM

## 2012-05-06 ENCOUNTER — Observation Stay (HOSPITAL_COMMUNITY): Payer: Medicaid Other

## 2012-05-06 LAB — BASIC METABOLIC PANEL
BUN: 6 mg/dL (ref 6–23)
Chloride: 100 mEq/L (ref 96–112)
Creatinine, Ser: 0.71 mg/dL (ref 0.50–1.35)
Glucose, Bld: 100 mg/dL — ABNORMAL HIGH (ref 70–99)
Potassium: 3.5 mEq/L (ref 3.5–5.1)

## 2012-05-06 LAB — CBC
HCT: 38.1 % — ABNORMAL LOW (ref 39.0–52.0)
Hemoglobin: 12.9 g/dL — ABNORMAL LOW (ref 13.0–17.0)
MCH: 30 pg (ref 26.0–34.0)
MCHC: 33.9 g/dL (ref 30.0–36.0)
RDW: 13.6 % (ref 11.5–15.5)

## 2012-05-06 NOTE — Progress Notes (Signed)
Subjective: Pt ok, no pain, no SOB Elevated BP overnight, given hydralazine  Objective: Physical Exam: BP 156/92  Pulse 60  Temp 98 F (36.7 C) (Oral)  Resp 18  Ht 5\' 7"  (1.702 m)  Wt 196 lb 3.4 oz (89 kg)  BMI 30.73 kg/m2  SpO2 98% Lungs: CTA Ant chest drain in place, no air leak   Labs: CBC  Basename 05/06/12 0313  WBC 8.3  HGB 12.9*  HCT 38.1*  PLT 357   BMET  Basename 05/06/12 0313  NA 138  K 3.5  CL 100  CO2 31  GLUCOSE 100*  BUN 6  CREATININE 0.71  CALCIUM 9.0   LFT No results found for this basename: PROT,ALBUMIN,AST,ALT,ALKPHOS,BILITOT,BILIDIR,IBILI,LIPASE in the last 72 hours PT/INR No results found for this basename: LABPROT:2,INR:2 in the last 72 hours   Studies/Results: Ct Guided Abscess Drain  05/05/2012  *RADIOLOGY REPORT*  Clinical Data:  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  The patient is not an operative candidate and presents for percutaneous ablation.  1.  CT-GUIDED PERCUTANEOUS MICROWAVE ABLATION OF LEFT UPPER LOBE LUNG CARCINOMA. 2.  CT GUIDED LEFT TUBE THORACOSTOMY  Anesthesia:  General  Medications:  2 grams IV Ancef.  As antibiotic prophylaxis, Ancef was ordered pre-procedure and administered intravenously within one hour of incision.  Contrast:  None  Procedure:  The procedure, risks, benefits, and alternatives were explained to the patient.  Questions regarding the procedure were encouraged and answered.  The patient understands and consents to the procedure.  The patient was placed under general anesthesia.  Initial unenhanced CT was performed in a supine position to localize the left upper lobe tumor.  The left anterior chest wall was prepped with Betadine in a sterile fashion, and a sterile drape was applied covering the operative field.  A sterile gown and sterile gloves were used for the procedure.  Under CT guidance, a series of two parallel 15 cm length NeuWave PR microwave probes were advanced into the tumor.  Probe  positioning and depth were confirmed by CT.  Microwave ablation was performed through the two probes simultaneously for a 10 minutes cycle.  Initial power was set at 40 Watts and increased to 65 Watts after 2 minutes.  Post-procedural CT was performed.  Complications: Pneumothorax.  Findings:  The left upper lobe squamous carcinoma measures approximately 2.2 x 2.9 cm.  From an anterior approach, parallel microwave ablation probes were positioned in the lesion.  During probe placement, the patient sustained a small pneumothorax.  The ablation was uncomplicated.  Due to slight increase in size of the pneumothorax during ablation with development of a small air-fluid level in the lateral pleural space, decision was made to place a chest tube.  A 12-French pigtail drain was advanced over a guidewire into the lateral pleural space.  Catheter position was confirmed by CT.  The tube was connected to a Sahara Pleur-Evac device and connected to wall suction at a pressure of minus 20 cm of water.  IMPRESSION: CT guided percutaneous microwave ablation of a left upper lobe squamous carcinoma. The procedure was complicated by pneumothorax which was treated with left tube thoracostomy with placement of a 12-French pigtail drain in the pleural space.  This will be connected to wall suction and followed by chest x-ray.  The patient will be admitted for overnight observation.   Original Report Authenticated By: Reola Calkins, M.D.    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  The patient is not an operative candidate and presents for percutaneous ablation.  1.  CT-GUIDED PERCUTANEOUS MICROWAVE ABLATION OF LEFT UPPER LOBE LUNG CARCINOMA. 2.  CT GUIDED LEFT TUBE THORACOSTOMY  Anesthesia:  General  Medications:  2 grams IV Ancef.  As antibiotic prophylaxis, Ancef was ordered pre-procedure and administered intravenously within one hour of  incision.  Contrast:  None  Procedure:  The procedure, risks, benefits, and alternatives were explained to the patient.  Questions regarding the procedure were encouraged and answered.  The patient understands and consents to the procedure.  The patient was placed under general anesthesia.  Initial unenhanced CT was performed in a supine position to localize the left upper lobe tumor.  The left anterior chest wall was prepped with Betadine in a sterile fashion, and a sterile drape was applied covering the operative field.  A sterile gown and sterile gloves were used for the procedure.  Under CT guidance, a series of two parallel 15 cm length NeuWave PR microwave probes were advanced into the tumor.  Probe positioning and depth were confirmed by CT.  Microwave ablation was performed through the two probes simultaneously for a 10 minutes cycle.  Initial power was set at 40 Watts and increased to 65 Watts after 2 minutes.  Post-procedural CT was performed.  Complications: Pneumothorax.  Findings:  The left upper lobe squamous carcinoma measures approximately 2.2 x 2.9 cm.  From an anterior approach, parallel microwave ablation probes were positioned in the lesion.  During probe placement, the patient sustained a small pneumothorax.  The ablation was uncomplicated.  Due to slight increase in size of the pneumothorax during ablation with development of a small air-fluid level in the lateral pleural space, decision was made to place a chest tube.  A 12-French pigtail drain was advanced over a guidewire into the lateral pleural space.  Catheter position was confirmed by CT.  The tube was connected to a Sahara Pleur-Evac device and connected to wall suction at a pressure of minus 20 cm of water.  IMPRESSION: CT guided percutaneous microwave ablation of a left upper lobe squamous carcinoma. The procedure was complicated by pneumothorax which was treated with left tube thoracostomy with placement of a 12-French pigtail drain  in the pleural space.  This will be connected to wall suction and followed by chest x-ray.  The patient will be admitted for overnight observation.   Original Report Authenticated By: Reola Calkins, M.D.    Dg Chest Port 1 View  05/06/2012  *RADIOLOGY REPORT*  Clinical Data: Pneumothorax status post left lung mass ablation  PORTABLE CHEST - 1 VIEW  Comparison: Yesterday  Findings: Stable left chest tube.  No pneumothorax.  Minimal atelectasis at the right base.  Left apical opacity unchanged. Normal heart size.  IMPRESSION: Stable chest tube.  No pneumothorax.   Original Report Authenticated By: Donavan Burnet, M.D.    Dg Chest Port 1 View  05/05/2012  *RADIOLOGY REPORT*  Clinical Data: Left chest tube after left pneumothorax  PORTABLE CHEST - 1 VIEW  Comparison: CT chest from today as well as CT chest of 04/28/2012  Findings: A small bore left chest tube has been placed and the small left pneumothorax noted on CT of the chest is not visualized. Opacity surrounds the left upper lobe lung nodule consistent with post biopsy hemorrhage.  No pleural effusion is seen. There is mild atelectasis at the right lung base.  The heart  is mildly enlarged.  IMPRESSION:  1.  Left chest tube present with no definite left pneumothorax. 2.  Haziness around the left upper lobe lung nodule probably represents post biopsy hemorrhage and volume loss.   Original Report Authenticated By: Juline Patch, M.D.     Assessment/Plan: S/p microwave ablation LUL cacer Post procedure PTX s/p chest tube CXR shows no residual PTX Will place to H2O seal. Transfer to floor.    LOS: 1 day    Brayton El PA-C 05/06/2012 7:38 AM

## 2012-05-06 NOTE — Progress Notes (Signed)
Report called to [facility] on 3W.

## 2012-05-07 ENCOUNTER — Inpatient Hospital Stay (HOSPITAL_COMMUNITY): Payer: Medicaid Other

## 2012-05-07 DIAGNOSIS — C3410 Malignant neoplasm of upper lobe, unspecified bronchus or lung: Secondary | ICD-10-CM | POA: Diagnosis present

## 2012-05-07 MED ORDER — HYDROCODONE-ACETAMINOPHEN 5-325 MG PO TABS: 1.0000 | ORAL_TABLET | ORAL | Status: AC | PRN

## 2012-05-07 NOTE — Progress Notes (Signed)
Subjective: Pt ok, no SOB Tol H20 seal all day and night. No c/o  Objective: Physical Exam: BP 175/100  Pulse 79  Temp 98.4 F (36.9 C) (Oral)  Resp 18  Ht 5\' 7"  (1.702 m)  Wt 196 lb 3.4 oz (89 kg)  BMI 30.73 kg/m2  SpO2 100% Chest drain intact, site clean No air leak Lungs: CTA  Chest tube removed without difficulty. Occlusive dressing applied  Labs: CBC  Basename 05/06/12 0313  WBC 8.3  HGB 12.9*  HCT 38.1*  PLT 357   BMET  Basename 05/06/12 0313  NA 138  K 3.5  CL 100  CO2 31  GLUCOSE 100*  BUN 6  CREATININE 0.71  CALCIUM 9.0   LFT No results found for this basename: PROT,ALBUMIN,AST,ALT,ALKPHOS,BILITOT,BILIDIR,IBILI,LIPASE in the last 72 hours PT/INR No results found for this basename: LABPROT:2,INR:2 in the last 72 hours   Studies/Results: Dg Chest 1 View  05/07/2012  *RADIOLOGY REPORT*  Clinical Data: Follow-up pneumothorax  CHEST - 1 VIEW  Comparison: 05/06/2012  Findings: Stable left chest tube.  No pneumothorax.  Left upper lobe airspace disease improved.  Minimal atelectasis at the right base.  IMPRESSION: No pneumothorax.   Original Report Authenticated By: Donavan Burnet, M.D.    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  The patient is not an operative candidate and presents for percutaneous ablation.  1.  CT-GUIDED PERCUTANEOUS MICROWAVE ABLATION OF LEFT UPPER LOBE LUNG CARCINOMA. 2.  CT GUIDED LEFT TUBE THORACOSTOMY  Anesthesia:  General  Medications:  2 grams IV Ancef.  As antibiotic prophylaxis, Ancef was ordered pre-procedure and administered intravenously within one hour of incision.  Contrast:  None  Procedure:  The procedure, risks, benefits, and alternatives were explained to the patient.  Questions regarding the procedure were encouraged and answered.  The patient understands and consents to the procedure.  The patient was placed under general anesthesia.   Initial unenhanced CT was performed in a supine position to localize the left upper lobe tumor.  The left anterior chest wall was prepped with Betadine in a sterile fashion, and a sterile drape was applied covering the operative field.  A sterile gown and sterile gloves were used for the procedure.  Under CT guidance, a series of two parallel 15 cm length NeuWave PR microwave probes were advanced into the tumor.  Probe positioning and depth were confirmed by CT.  Microwave ablation was performed through the two probes simultaneously for a 10 minutes cycle.  Initial power was set at 40 Watts and increased to 65 Watts after 2 minutes.  Post-procedural CT was performed.  Complications: Pneumothorax.  Findings:  The left upper lobe squamous carcinoma measures approximately 2.2 x 2.9 cm.  From an anterior approach, parallel microwave ablation probes were positioned in the lesion.  During probe placement, the patient sustained a small pneumothorax.  The ablation was uncomplicated.  Due to slight increase in size of the pneumothorax during ablation with development of a small air-fluid level in the lateral pleural space, decision was made to place a chest tube.  A 12-French pigtail drain was advanced over a guidewire into the lateral pleural space.  Catheter position was confirmed by CT.  The tube was connected to a Sahara Pleur-Evac device and connected to wall suction at a pressure of minus 20 cm of water.  IMPRESSION: CT guided percutaneous microwave ablation of a left upper lobe squamous  carcinoma. The procedure was complicated by pneumothorax which was treated with left tube thoracostomy with placement of a 12-French pigtail drain in the pleural space.  This will be connected to wall suction and followed by chest x-ray.  The patient will be admitted for overnight observation.   Original Report Authenticated By: Reola Calkins, M.D.    Ct Guide Tissue Ablation  05/05/2012  *RADIOLOGY REPORT*  Clinical Data:  Left  upper lobe squamous cell carcinoma of the lung.  The patient is not an operative candidate and presents for percutaneous ablation.  1.  CT-GUIDED PERCUTANEOUS MICROWAVE ABLATION OF LEFT UPPER LOBE LUNG CARCINOMA. 2.  CT GUIDED LEFT TUBE THORACOSTOMY  Anesthesia:  General  Medications:  2 grams IV Ancef.  As antibiotic prophylaxis, Ancef was ordered pre-procedure and administered intravenously within one hour of incision.  Contrast:  None  Procedure:  The procedure, risks, benefits, and alternatives were explained to the patient.  Questions regarding the procedure were encouraged and answered.  The patient understands and consents to the procedure.  The patient was placed under general anesthesia.  Initial unenhanced CT was performed in a supine position to localize the left upper lobe tumor.  The left anterior chest wall was prepped with Betadine in a sterile fashion, and a sterile drape was applied covering the operative field.  A sterile gown and sterile gloves were used for the procedure.  Under CT guidance, a series of two parallel 15 cm length NeuWave PR microwave probes were advanced into the tumor.  Probe positioning and depth were confirmed by CT.  Microwave ablation was performed through the two probes simultaneously for a 10 minutes cycle.  Initial power was set at 40 Watts and increased to 65 Watts after 2 minutes.  Post-procedural CT was performed.  Complications: Pneumothorax.  Findings:  The left upper lobe squamous carcinoma measures approximately 2.2 x 2.9 cm.  From an anterior approach, parallel microwave ablation probes were positioned in the lesion.  During probe placement, the patient sustained a small pneumothorax.  The ablation was uncomplicated.  Due to slight increase in size of the pneumothorax during ablation with development of a small air-fluid level in the lateral pleural space, decision was made to place a chest tube.  A 12-French pigtail drain was advanced over a guidewire into the  lateral pleural space.  Catheter position was confirmed by CT.  The tube was connected to a Sahara Pleur-Evac device and connected to wall suction at a pressure of minus 20 cm of water.  IMPRESSION: CT guided percutaneous microwave ablation of a left upper lobe squamous carcinoma. The procedure was complicated by pneumothorax which was treated with left tube thoracostomy with placement of a 12-French pigtail drain in the pleural space.  This will be connected to wall suction and followed by chest x-ray.  The patient will be admitted for overnight observation.   Original Report Authenticated By: Reola Calkins, M.D.    Dg Chest Port 1 View  05/06/2012  *RADIOLOGY REPORT*  Clinical Data: Pneumothorax status post left lung mass ablation  PORTABLE CHEST - 1 VIEW  Comparison: Yesterday  Findings: Stable left chest tube.  No pneumothorax.  Minimal atelectasis at the right base.  Left apical opacity unchanged. Normal heart size.  IMPRESSION: Stable chest tube.  No pneumothorax.   Original Report Authenticated By: Donavan Burnet, M.D.    Dg Chest Port 1 View  05/05/2012  *RADIOLOGY REPORT*  Clinical Data: Left chest tube after left pneumothorax  PORTABLE  CHEST - 1 VIEW  Comparison: CT chest from today as well as CT chest of 04/28/2012  Findings: A small bore left chest tube has been placed and the small left pneumothorax noted on CT of the chest is not visualized. Opacity surrounds the left upper lobe lung nodule consistent with post biopsy hemorrhage.  No pleural effusion is seen. There is mild atelectasis at the right lung base.  The heart is mildly enlarged.  IMPRESSION:  1.  Left chest tube present with no definite left pneumothorax. 2.  Haziness around the left upper lobe lung nodule probably represents post biopsy hemorrhage and volume loss.   Original Report Authenticated By: Juline Patch, M.D.     Assessment/Plan: S/p microwave ablation LUL cacer  Post procedure PTX s/p chest tube  CXR stable after  24hr water seal. Chest tube [facility] If repeat CXR ok, will DC home    LOS: 2 days    Brayton El PA-C 05/07/2012 7:39 AM

## 2012-05-07 NOTE — Discharge Summary (Signed)
Physician Discharge Summary  Patient ID: Derek Webb MRN: [id number] DOB/AGE: Jun 07, 1951 61 y.o.  Admit date: 05/05/2012 Discharge date: 05/07/2012  Admission Diagnoses: Principal Problem:  *Lung cancer, Left upper lobe  Discharge Diagnoses:  Principal Problem:  *Lung cancer, Leftupper lobe    Procedures: Procedure(s): CT guided percutaneous Microwave ablation (L)UL lung cancerous mass.  Discharged Condition: good  Hospital Course: HPI: Derek Webb is an 61 y.o. male who was diagnosed with LUL lung cancer by perc biopsy. He is determined not to be a surgical candidate and was referred to Dr. Fredia Sorrow for consideration of percutaneous microwave ablation procedure. After consultation, the pt has elected to proceed with this procedure. He was advised to hold his Plavix and his last dose was on 9/9.  He otherwise feels ok, no new c/o, no recent illnesses or infections since being seen in clinic.  Pre-assessment was completed  Pt brought to CT dept and underwent above mentioned procedure under general anesthesia. He tolerated the procedure well but did developed a small (L)pneumothorax and a small pigtail chest tube was placed. The pt was admitted the ICU as a precaution and his CT was hooked to PLeuraVac suction. On POD #1, he looked very good and his CXR showed resolution of the PTX. His chest tube was placed to water seal for 24hrs. He tolerated this very well and another f/u CXR showed no evidence of recurrent PTX. He had no air leak and his chest tube was then removed. A final follow up CXR again, showed no PTX. At this point we feel the pt is stable for discharge. No additional complications or issued were encountered. We reviewed his discharge instructions and follow up plan.  Consults: None   Discharge Exam: Blood pressure 175/100, pulse 79, temperature 98.4 F (36.9 C), temperature source Oral, resp. rate 18, height 5\' 7"  (1.702 m), weight 196 lb 3.4 oz (89 kg), SpO2  100.00%. Lungs: CTA without w/r/r. Ant chest tube site with occlusive dressing, clean and dry. Heart: Regular.   Disposition: Home      Discharge Orders    Future Orders Please Complete By Expires   Diet - low sodium heart healthy      Increase activity slowly      Remove dressing in 48 hours      Comments:   Keep dressing dry and do not remove for 48hrs. After that time, will be okay to remove and shower.   Call MD for:  difficulty breathing, headache or visual disturbances      Call MD for:  redness, tenderness, or signs of infection (pain, swelling, redness, odor or green/yellow discharge around incision site)      Call MD for:  severe uncontrolled pain      Call MD for:  temperature >100.4          Medication List     As of 05/07/2012  8:14 AM    TAKE these medications         aspirin 325 MG tablet   Take 325 mg by mouth daily.      atorvastatin 40 MG tablet   Commonly known as: LIPITOR   Take 40 mg by mouth at bedtime.      clopidogrel 75 MG tablet   Commonly known as: PLAVIX   Take 75 mg by mouth daily.      ferrous sulfate 325 (65 FE) MG tablet   Take 325 mg by mouth daily with breakfast.      HYDROcodone-acetaminophen  5-325 MG per tablet   Commonly known as: NORCO/VICODIN   Take 1 tablet by mouth every 4 (four) hours as needed for pain.      pantoprazole 40 MG tablet   Commonly known as: PROTONIX   Take 40 mg by mouth daily. IN AM      phenytoin 100 MG ER capsule   Commonly known as: DILANTIN   Take 400 mg by mouth at bedtime.      quinapril 10 MG tablet   Commonly known as: ACCUPRIL   Take 10 mg by mouth at bedtime.      Vitamin D 2000 UNITS Caps   Take by mouth. VITAMIN D 3 2000 IU  ONE SOFTGEL DAILY  --OTC        Follow-up Information    Follow up with [facility] T, MD. In 4 weeks. (Office will call with appointment date/time)    Contact information:   [facility] [address] [phone]            Signed: Brayton El PA-C 05/07/2012, 8:14 AM

## 2012-05-09 ENCOUNTER — Other Ambulatory Visit: Payer: Self-pay | Admitting: Interventional Radiology

## 2012-05-09 DIAGNOSIS — R911 Solitary pulmonary nodule: Secondary | ICD-10-CM

## 2012-06-14 ENCOUNTER — Other Ambulatory Visit: Payer: Medicaid Other

## 2012-06-14 ENCOUNTER — Ambulatory Visit (HOSPITAL_COMMUNITY)
Admission: RE | Admit: 2012-06-14 | Discharge: 2012-06-14 | Payer: Medicaid Other | Source: Ambulatory Visit | Attending: Interventional Radiology | Admitting: Interventional Radiology

## 2012-06-27 ENCOUNTER — Ambulatory Visit (HOSPITAL_COMMUNITY)
Admission: RE | Admit: 2012-06-27 | Discharge: 2012-06-27 | Disposition: A | Discharge status: Home / Self Care | Payer: Medicaid Other | Source: Ambulatory Visit | Attending: Interventional Radiology | Admitting: Interventional Radiology

## 2012-06-27 ENCOUNTER — Ambulatory Visit
Admission: RE | Admit: 2012-06-27 | Discharge: 2012-06-27 | Disposition: A | Discharge status: Home / Self Care | Payer: Medicaid Other | Source: Ambulatory Visit | Attending: Interventional Radiology | Admitting: Interventional Radiology

## 2012-06-27 VITALS — BP 142/91 | HR 68 | Temp 98.0°F | Resp 16 | Ht 67.0 in | Wt 197.0 lb

## 2012-06-27 DIAGNOSIS — R911 Solitary pulmonary nodule: Secondary | ICD-10-CM | POA: Insufficient documentation

## 2012-06-27 DIAGNOSIS — C3410 Malignant neoplasm of upper lobe, unspecified bronchus or lung: Secondary | ICD-10-CM

## 2012-06-27 DIAGNOSIS — Z09 Encounter for follow-up examination after completed treatment for conditions other than malignant neoplasm: Secondary | ICD-10-CM | POA: Insufficient documentation

## 2012-06-27 MED ORDER — IOHEXOL 300 MG/ML  SOLN: 80.0000 mL | Freq: Once | INTRAMUSCULAR | Status: DC | PRN

## 2012-06-27 MED ORDER — IOHEXOL 300 MG/ML  SOLN
80.0000 mL | Freq: Once | INTRAMUSCULAR | Status: AC | PRN
  Administered 2012-06-27: 80 mL via INTRAVENOUS

## 2012-06-27 NOTE — Progress Notes (Signed)
Denies pain.    Denies cough, dypnea or respiratory difficulty.  Appetite, good.  Weight, stable.  Sleeping, well    Able to complete ADL's w/o assistance.

## 2012-06-28 NOTE — Progress Notes (Signed)
Patient ID: Derek Webb, male   DOB: [birth year], 61 y.o.   MRN: [id number]  ESTABLISHED PATIENT OFFICE VISIT  Chief Complaint: Status post percutaneous microwave ablation of a left upper lobe squamous carcinoma on 05/05/2012.  History: Derek Webb returns for initial follow-up. He has done extremely well after ablation with no complaints currently. The ablation was complicated by a pneumothorax requiring pigtail chest tube placement. This was kept to wall suction for 2 days and removed with successful elimination of the pneumothorax.  Review of Systems: No fever, chills, hemoptysis, chest pain, productive cough or shortness of breath.  Exam: Vital signs: 142/91, pulse 68, respirations 16, temperature 98, oxygen saturation 97% on room air. General: No acute distress. Chest: Bilateral wheezing with expiration. No focal crackles. Heart: Regular rate and rhythm.  Imaging: Follow-up CT was performed today and shows ablated tissue in the left upper lobe which is very well circumscribed and larger than the original dimensions of the left upper lobe carcinoma. The margins of ablation appear to encompass the original lesion. There is no evidence of pneumothorax, pleural fluid or bronchopleural fistula.  Assessment and Plan: Derek Webb has done very well after microwave ablation of a left upper lobe squamous carcinoma. He shows no signs of complication and the initial CT appearance shows an ablation zone larger than, and completely encompassing, the original lesion. I have recommended a PET scan some time in December. This can be reperformed at [facility], since the patient lives near there, or be performed locally.

## 2012-07-28 ENCOUNTER — Encounter (HOSPITAL_COMMUNITY): Payer: Medicaid Other

## 2012-08-14 ENCOUNTER — Telehealth: Payer: Self-pay | Admitting: Emergency Medicine

## 2012-08-14 ENCOUNTER — Encounter (HOSPITAL_COMMUNITY)
Admission: RE | Admit: 2012-08-14 | Discharge: 2012-08-14 | Disposition: A | Discharge status: Home / Self Care | Payer: Medicaid Other | Source: Ambulatory Visit | Attending: Interventional Radiology | Admitting: Interventional Radiology

## 2012-08-14 DIAGNOSIS — C3410 Malignant neoplasm of upper lobe, unspecified bronchus or lung: Secondary | ICD-10-CM

## 2012-08-14 MED ORDER — FLUDEOXYGLUCOSE F - 18 (FDG) INJECTION
18.4000 | Freq: Once | INTRAVENOUS | Status: AC | PRN
  Administered 2012-08-14: 18.4 via INTRAVENOUS

## 2012-08-14 NOTE — Telephone Encounter (Signed)
ERROR

## 2012-08-22 ENCOUNTER — Telehealth: Payer: Self-pay | Admitting: Emergency Medicine

## 2012-08-22 NOTE — Telephone Encounter (Signed)
S/W LINDA Poli (SISTER) TO EXPLAIN TO HER THAT DR Fredia Sorrow REVIEWED PET SCAN AND IT LOOKS GREAT!!!!   THEY WILL HOLD OFF ON COMING IN UNTIL HIS F/U SCAN AND VISIT FOR June 2014.

## 2013-01-04 ENCOUNTER — Other Ambulatory Visit (HOSPITAL_COMMUNITY): Payer: Self-pay | Admitting: Interventional Radiology

## 2013-01-04 DIAGNOSIS — C3492 Malignant neoplasm of unspecified part of left bronchus or lung: Secondary | ICD-10-CM

## 2013-01-12 ENCOUNTER — Ambulatory Visit: Payer: Self-pay | Admitting: Physician Assistant

## 2013-02-07 ENCOUNTER — Ambulatory Visit
Admission: RE | Admit: 2013-02-07 | Discharge: 2013-02-07 | Disposition: A | Discharge status: Home / Self Care | Payer: Medicaid Other | Source: Ambulatory Visit | Attending: Interventional Radiology | Admitting: Interventional Radiology

## 2013-02-07 ENCOUNTER — Ambulatory Visit (HOSPITAL_COMMUNITY)
Admission: RE | Admit: 2013-02-07 | Discharge: 2013-02-07 | Disposition: A | Discharge status: Home / Self Care | Payer: Medicaid Other | Source: Ambulatory Visit | Attending: Interventional Radiology | Admitting: Interventional Radiology

## 2013-02-07 ENCOUNTER — Encounter (HOSPITAL_COMMUNITY): Payer: Self-pay

## 2013-02-07 DIAGNOSIS — E278 Other specified disorders of adrenal gland: Secondary | ICD-10-CM | POA: Insufficient documentation

## 2013-02-07 DIAGNOSIS — C3410 Malignant neoplasm of upper lobe, unspecified bronchus or lung: Secondary | ICD-10-CM | POA: Insufficient documentation

## 2013-02-07 DIAGNOSIS — I889 Nonspecific lymphadenitis, unspecified: Secondary | ICD-10-CM | POA: Insufficient documentation

## 2013-02-07 DIAGNOSIS — C3492 Malignant neoplasm of unspecified part of left bronchus or lung: Secondary | ICD-10-CM

## 2013-02-07 DIAGNOSIS — M47814 Spondylosis without myelopathy or radiculopathy, thoracic region: Secondary | ICD-10-CM | POA: Insufficient documentation

## 2013-02-07 MED ORDER — IOHEXOL 300 MG/ML  SOLN
80.0000 mL | Freq: Once | INTRAMUSCULAR | Status: AC | PRN
  Administered 2013-02-07: 80 mL via INTRAVENOUS

## 2013-02-07 NOTE — Progress Notes (Signed)
Patient ID: Derek Webb, male   DOB: [birth year], 62 y.o.   MRN: [id number]  ESTABLISHED PATIENT OFFICE VISIT  Chief Complaint: Status post percutaneous microwave ablation of a left upper lobe squamous carcinoma on 05/05/2012.  History: Derek Webb returns for follow-up. He has been doing well. He has no complaints. Since prior follow up in November, he has had a PET scan on 08/14/2012 and a follow-up CT scan of the chest today.  Review of Systems: No fever or chills. No chest pain, shortness of breath, cough or hemoptysis.  Exam: Vital signs: Blood pressure 124/86, pulse 68, respirations 15, temperature 98.1, oxygen saturation 90 8% on room air. General: No acute distress. Chest: Clear auscultation bilaterally. Heart: Regular rate and rhythm.  Labs: BUN 7, creatinine 0.77 and estimated GFR 115 ml/minute.  Imaging: Follow-up CT of the chest performed today demonstrates decrease in size of the ablated left upper lobe carcinoma. No new nodules, enlarged lymph node or pneumothorax are identified. PET scan on 08/14/2012 demonstrates low-level activity in the treated left upper lobe carcinoma with SUV max of 2.8. Prior to treatment, SUV max was 10.0 on a PET scan performed at [facility]. There was no evidence of metastatic disease by PET.  Assessment and Plan: Derek Webb is doing quite well after microwave ablation of a left upper lobe squamous carcinoma. The ablation defect shows decrease in size by CT and also significant diminished metabolic activity by PET scan. There is no evidence of metastatic disease. I have recommended a follow-up CT of the chest in 6 months.

## 2013-06-27 ENCOUNTER — Other Ambulatory Visit (HOSPITAL_COMMUNITY): Payer: Self-pay | Admitting: Interventional Radiology

## 2013-06-27 DIAGNOSIS — C3492 Malignant neoplasm of unspecified part of left bronchus or lung: Secondary | ICD-10-CM

## 2013-09-20 ENCOUNTER — Ambulatory Visit (HOSPITAL_COMMUNITY): Payer: Medicaid Other

## 2013-09-20 ENCOUNTER — Inpatient Hospital Stay: Admission: RE | Admit: 2013-09-20 | Payer: Medicaid Other | Source: Ambulatory Visit

## 2013-10-02 ENCOUNTER — Ambulatory Visit (HOSPITAL_COMMUNITY)
Admission: RE | Admit: 2013-10-02 | Discharge: 2013-10-02 | Disposition: A | Discharge status: Home / Self Care | Payer: Medicaid Other | Source: Ambulatory Visit | Attending: Interventional Radiology | Admitting: Interventional Radiology

## 2013-10-02 ENCOUNTER — Ambulatory Visit
Admission: RE | Admit: 2013-10-02 | Discharge: 2013-10-02 | Disposition: A | Discharge status: Home / Self Care | Payer: Medicaid Other | Source: Ambulatory Visit | Attending: Interventional Radiology | Admitting: Interventional Radiology

## 2013-10-02 ENCOUNTER — Encounter (HOSPITAL_COMMUNITY): Payer: Self-pay

## 2013-10-02 DIAGNOSIS — C3492 Malignant neoplasm of unspecified part of left bronchus or lung: Secondary | ICD-10-CM

## 2013-10-02 DIAGNOSIS — E278 Other specified disorders of adrenal gland: Secondary | ICD-10-CM | POA: Insufficient documentation

## 2013-10-02 DIAGNOSIS — K7689 Other specified diseases of liver: Secondary | ICD-10-CM | POA: Insufficient documentation

## 2013-10-02 DIAGNOSIS — C3410 Malignant neoplasm of upper lobe, unspecified bronchus or lung: Secondary | ICD-10-CM | POA: Insufficient documentation

## 2013-10-02 DIAGNOSIS — I77810 Thoracic aortic ectasia: Secondary | ICD-10-CM | POA: Insufficient documentation

## 2013-10-02 MED ORDER — IOHEXOL 300 MG/ML  SOLN
80.0000 mL | Freq: Once | INTRAMUSCULAR | Status: AC | PRN
  Administered 2013-10-02: 80 mL via INTRAVENOUS

## 2014-03-11 ENCOUNTER — Ambulatory Visit: Payer: Self-pay | Admitting: Unknown Physician Specialty

## 2014-03-14 LAB — PATHOLOGY REPORT

## 2014-09-16 ENCOUNTER — Other Ambulatory Visit: Payer: Self-pay | Admitting: Emergency Medicine

## 2014-09-16 ENCOUNTER — Other Ambulatory Visit: Payer: Self-pay | Admitting: Interventional Radiology

## 2014-09-16 DIAGNOSIS — C3492 Malignant neoplasm of unspecified part of left bronchus or lung: Secondary | ICD-10-CM

## 2014-10-01 ENCOUNTER — Telehealth: Payer: Self-pay | Admitting: Emergency Medicine

## 2014-10-01 NOTE — Telephone Encounter (Signed)
LMOAM FOR LINDA W/ NEW APPT FOR CT- R/S TO 10-07-14 AT 300/245PM AT THE [facility] LOCATION AT [address].  REMINDED THEM TO GO FOR LABS THIS WEEK.

## 2014-10-09 ENCOUNTER — Other Ambulatory Visit: Payer: Medicaid Other

## 2014-10-14 ENCOUNTER — Ambulatory Visit: Payer: Self-pay | Admitting: Interventional Radiology

## 2014-10-22 ENCOUNTER — Ambulatory Visit
Admission: RE | Admit: 2014-10-22 | Discharge: 2014-10-22 | Disposition: A | Discharge status: Home / Self Care | Payer: Medicaid Other | Source: Ambulatory Visit | Attending: Interventional Radiology | Admitting: Interventional Radiology

## 2014-10-22 DIAGNOSIS — C3490 Malignant neoplasm of unspecified part of unspecified bronchus or lung: Secondary | ICD-10-CM | POA: Insufficient documentation

## 2014-10-22 DIAGNOSIS — C3492 Malignant neoplasm of unspecified part of left bronchus or lung: Secondary | ICD-10-CM

## 2014-10-22 HISTORY — PX: IR GENERIC HISTORICAL: IMG1180011

## 2014-10-22 NOTE — Progress Notes (Signed)
Chief Complaint: Chief Complaint  Patient presents with  . Follow-up    2.5 yr follow up Lung ablation    History of Present Illness: Derek Webb is a 64 y.o. male status post percutaneous microwave ablation of a left upper lobe squamous lung carcinoma on 05/05/2012. The patient has done extremely well after treatment with no evidence of recurrence. He denies any current active symptoms.  Past Medical History  Diagnosis Date  . Stroke     2 TO 3 YRS AGO-AFFECTED HIS SPEECH--ABLE TO AMBULATE WITHOUT ASSIST AND DOES YARD WORK.  DECREASED HEARING IN BOTH EARS SINCE STROKE.  Marland Kitchen Elevated cholesterol   . Hypertension   . Seizures     CHRONIC DILATIN - PT STATES NO SEIZURES IN PAST COUPLE OF YEARS; PT STATES HIS SEIZURES CAUSED NUMBNESS OF ARMS AND HANDS AND NOT ABLE TO MOVE HIS ARMS  . COPD (chronic obstructive pulmonary disease)     SEVERE COPD -OCCASIONALLY USES OXYGEN AT NIGHT-DOES NOT USE INHALERS ON REGULAR BASIS  . Lung cancer     LEFT UPPER LUNG CARCINOMA-NOT A CANDIDATE FOR SURGICAL RESECTION BECAUSE OF HIS COPD AND POOR CANDIDATE FOR RADIATION BECAUSE OF TRANSPORTATION PROBLEMS    Past Surgical History  Procedure Laterality Date  . Colon polyp excision    . Colon surgery    . Carotid surgery 2009 -left      Allergies: Review of patient's allergies indicates no known allergies.  Medications: Prior to Admission medications   Medication Sig Start Date End Date Taking? Authorizing Provider  aspirin 81 MG tablet Take 81 mg by mouth daily.   Yes Historical Provider, MD  atorvastatin (LIPITOR) 80 MG tablet Take 80 mg by mouth daily.   Yes Historical Provider, MD  budesonide-formoterol (SYMBICORT) 160-4.5 MCG/ACT inhaler Inhale 1 puff into the lungs 2 (two) times daily.   Yes Historical Provider, MD  Cholecalciferol (VITAMIN D) 2000 UNITS CAPS Take by mouth. VITAMIN D 3 2000 IU  ONE SOFTGEL DAILY  --OTC   Yes Historical Provider, MD  clopidogrel (PLAVIX) 75 MG tablet Take 75  mg by mouth daily.   Yes Historical Provider, MD  enalapril (VASOTEC) 10 MG tablet Take 10 mg by mouth daily.   Yes Historical Provider, MD  ezetimibe (ZETIA) 10 MG tablet Take 10 mg by mouth daily.   Yes Historical Provider, MD  pantoprazole (PROTONIX) 40 MG tablet Take 40 mg by mouth daily. IN AM   Yes Historical Provider, MD  phenytoin (DILANTIN) 100 MG ER capsule Take 400 mg by mouth at bedtime.   Yes Historical Provider, MD  aspirin 325 MG tablet Take 81 mg by mouth daily.     Historical Provider, MD  atorvastatin (LIPITOR) 40 MG tablet Take 80 mg by mouth at bedtime.     Historical Provider, MD  ferrous sulfate 325 (65 FE) MG tablet Take 325 mg by mouth daily with breakfast.    Historical Provider, MD  quinapril (ACCUPRIL) 10 MG tablet Take 10 mg by mouth at bedtime.    Historical Provider, MD    No family history on file.  History   Social History  . Marital Status: Single    Spouse Name: N/A  . Number of Children: N/A  . Years of Education: N/A   Social History Main Topics  . Smoking status: Former Smoker -- 1.00 packs/day for 35 years    Types: Cigarettes    Quit date: 12/22/2011  . Smokeless tobacco: Never Used  . Alcohol Use:  Yes     Comment: BEER AND OTHER ALCOHOL DAILY --MAYBE 6 PACK BEER AND 1 PINT LIQUOR EVERY OTHER DAY--DENIES [facility]  . Drug Use: No  . Sexual Activity: Not on file   Other Topics Concern  . Not on file   Social History Narrative  . No narrative on file    Review of Systems: A 12 point ROS discussed and pertinent positives are indicated in the HPI above.  All other systems are negative.  Review of Systems  Constitutional: Negative.   Respiratory: Negative.   Cardiovascular: Negative.   Gastrointestinal: Negative.   Neurological: Negative.     Vital Signs: BP 134/98 mmHg  Pulse 66  Temp(Src) 98 F (36.7 C) (Oral)  Resp 14  SpO2 97%  Physical Exam  Constitutional: He is oriented to person, place, and time. He appears  well-developed and well-nourished. No distress.  Cardiovascular: Normal rate, regular rhythm and normal heart sounds.  Exam reveals no gallop and no friction rub.   No murmur heard. Pulmonary/Chest: Effort normal. No respiratory distress. He has no wheezes. He has no rales. He exhibits no tenderness.  Neurological: He is alert and oriented to person, place, and time.  Skin: He is not diaphoretic.  Nursing note and vitals reviewed.   Imaging: No results found.  Labs:  CBC: No results for input(s): WBC, HGB, HCT, PLT in the last 8760 hours.  COAGS: No results for input(s): INR, APTT in the last 8760 hours.  BMP: No results for input(s): NA, K, CL, CO2, GLUCOSE, BUN, CALCIUM, CREATININE, GFRNONAA, GFRAA in the last 8760 hours.  Invalid input(s): CMP  LIVER FUNCTION TESTS: No results for input(s): BILITOT, AST, ALT, ALKPHOS, PROT, ALBUMIN in the last 8760 hours.  TUMOR MARKERS: No results for input(s): AFPTM, CEA, CA199, CHROMGRNA in the last 8760 hours.  Assessment and Plan:  I met with Mr. Hove and his sister. We reviewed a CT of the chest performed on 10/14/2014 at [facility]. The study demonstrates stable posttreatment left upper lobe scarring with no evidence of enlargement or abnormal enhancement to suggest residual active tumor. No evidence of lymphadenopathy in the chest. I recommended a follow-up CT in one year.   SignedAletta Edouard T 10/22/2014, 5:32 PM      I spent a total of 15 minutes face to face in clinical consultation, greater than 50% of which was counseling/coordinating care post left lung carcinoma ablation.

## 2014-12-15 NOTE — Discharge Summary (Signed)
PATIENT NAME:  Derek Webb, EBERLIN MR#:  [id number] DATE OF BIRTH:  1951-04-19  DATE OF ADMISSION:  08/11/2011 DATE OF DISCHARGE:  08/15/2011  ADMITTING PHYSICIAN: Dr. Rochel Brome.   ADMITTING DIAGNOSES: Two polyps of the left colon and umbilical hernia.   DISCHARGE DIAGNOSES: Two polyps of the left colon and umbilical hernia.   HISTORY OF PRESENT ILLNESS: This is a 64 year old male with recent colonoscopy findings of a large pedunculated mass in the descending colon and other polyp inferior to that. He also had physical findings of a small umbilical hernia. Surgery was recommended for definitive treatment.   HOSPITAL COURSE: The patient underwent left colectomy, rigid colonoscopy, and umbilical hernia repair on the 19th. He did well with surgery and was admitted to the floor for recovery. He did use IV pain medication for the first couple of days to control the pain. He was then able to transition to oral medication. He started clear liquids on postoperative day number one and was able to advance to a soft diet over the next couple of days. He had a bowel movement on postoperative day number three. On the day of discharge he was examined by the covering physician. He was tolerating a diet and had demonstrated bowel function. The discharge plan was discussed with the patient.   DISPOSITION: Discharged home with self-care in satisfactory condition.   MEDICATIONS: Resume all regular medications including Plavix. Tylenol as needed for soreness or Norco as needed for pain.   INSTRUCTIONS:  1. No dressings needed.  2. May shower.  3. Regular diet.  4. No exertional activity or heavy lifting.   5. Follow-up next week for staple removal or sooner if needed for any problems or concerns.   ____________________________ Celene Squibb. Theda Sers, [place] amc:rbg D: 08/25/2011 13:02:29 ET T: 08/26/2011 14:38:10 ET JOB#: [id number]  cc: Celene Squibb. Theda Sers, [place], <Dictator> Alyra Patty M Sueanne Maniaci PA ELECTRONICALLY SIGNED 08/26/2011  15:22

## 2014-12-15 NOTE — Op Note (Signed)
PATIENT NAME:  Derek Webb, Derek Webb MR#:  [id number] DATE OF BIRTH:  [birth year]  DATE OF PROCEDURE:  08/11/2011  PREOPERATIVE DIAGNOSIS: Two polyps of the left colon.   POSTOPERATIVE DIAGNOSIS: Two polyps of the left colon.   PROCEDURES PERFORMED: Left colectomy, rigid colonoscopy, umbilical hernia repair.   SURGEON: Loreli Dollar, MD  ASSISTANT: Britta Mccreedy, PA  ANESTHESIA: General.   INDICATIONS: This 64 year old male had recent colonoscopy findings of a large pedunculated mass of the descending colon. It was approximately 5 cm in dimension and pedunculated but the stalk was too large to snare. There was another pedunculated polyp more inferior to that. He also had physical findings of small umbilical hernia. Surgery was recommended for definitive treatment.   DESCRIPTION OF PROCEDURE: The patient was placed on the operating table in the supine position under general anesthesia. It is noted that the colonoscopy report was reviewed on the morning of surgery.   Upper abdominal midline incision was made, carried around to the left of the umbilicus and extended minimally into the hypogastrium. The midline fascia was incised. A number of small bleeding points were cauterized. The umbilical hernia defect was appreciated. There was some incarcerated omentum within the umbilical hernia defect and this was dissected free from the umbilicus. It is noted that during the course of dissection the incision was lengthened somewhat both upward direction and also caudal direction. The liver was smooth with no palpable mass. There was a large, approximately 5 cm, mass felt within the descending colon. The left colon was mobilized with incision of the lateral peritoneal reflection with sharp dissection and also numerous small bleeding points were cauterized and some of the dissection was carried out with the Harmonic scalpel. The splenic flexure was mobilized as the dissection was carried around partially with finger  dissection and partially with the Harmonic scalpel and scissors and a portion of the omentum was separated from the left transverse colon. Next, further dissection was carried down to mobilize the sigmoid colon. The colon was further palpated and could easily identify the 5 cm mass, although did not actually feel a mass distal to that and did review the colonoscopy report during surgery seeing the pictures and the site of the second smaller polyp and compared this with the segment of colon dissected out and elected to resect a segment which began some 5 inches proximal to the larger mass and extending down into the mid aspect of the sigmoid colon so as to encompass both polyps. Next, the mesenteric dissection was carried out to remove a V-shaped portion of mesentery using the Harmonic scalpel. One vessel was also suture ligated with 0 chromic and after completing the mesenteric dissection the bowel was divided with electrocautery proximally and the bowel edges held up with Allis clamps. The specimen controlled with a Kocher. Next, the bowel was divided distally with the Bovie and the end held up with Allis clamps. The specimen was carried over to a side table where it was opened. There was the large pedunculated mass which appeared that likely had caused some degree of intussusception. There was a smaller pedunculated polyp which appeared to be about a centimeter in dimension and another about 8 mm sessile polyp in the descending proximal sigmoid colon.   Also used the rigid proctoscope to do transabdominal colonoscopy looking into the distal portion of bowel. The bowel was suctioned with pulled suction and held the edges up with Allis clamps, introduced the scope and put the loop of the lap  pack around the bowel to create and air seal, insufflated the lower portion of the colon and inserted the scope in so that I could see approximately 20 cm into the distal bowel seeing no additional polyps as the mucosa  appeared to be smooth and pink and gradually pulled back the scope circumferentially examining the bowel and saw no other polyps in this segment and the scope was removed.   Next, anastomosis was carried out with a triangulation technique with application of the VK-18 stapler which was begun initially posteriorly. The caliber of the more distal bowel was somewhat larger than the more proximal bowel and therefore incised the antimesenteric border of the more proximal bowel to enlarge the anticipated staple line. Next continued with three more applications of the MC-37 stapler to complete the anastomosis. Anastomosis looked good as it was inspected and it was widely patent. Hemostasis was intact. Next, gloves were changed. Next, the left colic gutter was examined, just a small amount of blood was aspirated and hemostasis appeared to be intact. Next, the bowel was placed along the left colic gutter and the omentum was brought beneath the wound. Next, the umbilical hernia defect was further examined. A portion of peritoneum was separated from the fascia on the right side of the defect and then the abdominal fascia was closed with 0 Maxon figure-of-eight sutures. Next, the skin was closed with clips, dressings were applied with paper tape.    The patient tolerated surgery satisfactorily and was then prepared for transfer to the recovery room.  ____________________________ Lenna Sciara. Rochel Brome, MD jws:cms D: 08/11/2011 10:38:01 ET T: 08/11/2011 11:09:42 ET JOB#: [id number]  cc: Loreli Dollar, MD, <Dictator> Loreli Dollar MD ELECTRONICALLY SIGNED 09/05/2011 9:04

## 2015-10-22 ENCOUNTER — Other Ambulatory Visit: Payer: Self-pay | Admitting: Interventional Radiology

## 2015-10-22 DIAGNOSIS — C3492 Malignant neoplasm of unspecified part of left bronchus or lung: Secondary | ICD-10-CM

## 2015-10-24 ENCOUNTER — Other Ambulatory Visit (HOSPITAL_COMMUNITY): Payer: Self-pay | Admitting: Interventional Radiology

## 2015-10-24 ENCOUNTER — Other Ambulatory Visit: Payer: Self-pay | Admitting: *Deleted

## 2015-10-24 DIAGNOSIS — C3492 Malignant neoplasm of unspecified part of left bronchus or lung: Secondary | ICD-10-CM

## 2015-10-24 DIAGNOSIS — C3412 Malignant neoplasm of upper lobe, left bronchus or lung: Secondary | ICD-10-CM

## 2015-11-26 ENCOUNTER — Ambulatory Visit
Admission: RE | Admit: 2015-11-26 | Discharge: 2015-11-26 | Disposition: A | Discharge status: Home / Self Care | Payer: Medicaid Other | Source: Ambulatory Visit | Attending: Interventional Radiology | Admitting: Interventional Radiology

## 2015-11-26 DIAGNOSIS — R918 Other nonspecific abnormal finding of lung field: Secondary | ICD-10-CM | POA: Diagnosis not present

## 2015-11-26 DIAGNOSIS — I700 Atherosclerosis of aorta: Secondary | ICD-10-CM | POA: Insufficient documentation

## 2015-11-26 DIAGNOSIS — C3492 Malignant neoplasm of unspecified part of left bronchus or lung: Secondary | ICD-10-CM | POA: Insufficient documentation

## 2015-11-26 DIAGNOSIS — I2510 Atherosclerotic heart disease of native coronary artery without angina pectoris: Secondary | ICD-10-CM | POA: Insufficient documentation

## 2015-11-26 LAB — POCT I-STAT CREATININE: Creatinine, Ser: 0.6 mg/dL — ABNORMAL LOW (ref 0.61–1.24)

## 2015-11-26 MED ORDER — IOPAMIDOL (ISOVUE-300) INJECTION 61%
75.0000 mL | Freq: Once | INTRAVENOUS | Status: AC | PRN
  Administered 2015-11-26: 75 mL via INTRAVENOUS

## 2015-12-02 ENCOUNTER — Ambulatory Visit
Admission: RE | Admit: 2015-12-02 | Discharge: 2015-12-02 | Disposition: A | Discharge status: Home / Self Care | Payer: Medicaid Other | Source: Ambulatory Visit | Attending: Interventional Radiology | Admitting: Interventional Radiology

## 2015-12-02 DIAGNOSIS — C3492 Malignant neoplasm of unspecified part of left bronchus or lung: Secondary | ICD-10-CM

## 2015-12-02 NOTE — Progress Notes (Signed)
Chief Complaint: Follow-up status post percutaneous thermal ablation of a left upper lobe squamous lung carcinoma on 05/05/2012.  History of Present Illness: Derek Webb is a 65 y.o. male status post prior microwave thermal ablation of a left upper lobe squamous lung carcinoma. This has demonstrated no recurrence on follow-up imaging. He is currently asymptomatic.  Past Medical History  Diagnosis Date  . Stroke ([place])     2 TO 3 YRS AGO-AFFECTED Derek SPEECH--ABLE TO AMBULATE WITHOUT ASSIST AND DOES YARD WORK.  DECREASED HEARING IN BOTH EARS SINCE STROKE.  Marland Kitchen Elevated cholesterol   . Hypertension   . Seizures ([place])     CHRONIC DILATIN - PT STATES NO SEIZURES IN PAST COUPLE OF YEARS; PT STATES Derek SEIZURES CAUSED NUMBNESS OF ARMS AND HANDS AND NOT ABLE TO MOVE Derek ARMS  . COPD (chronic obstructive pulmonary disease) ([facility])     SEVERE COPD -OCCASIONALLY USES OXYGEN AT NIGHT-DOES NOT USE INHALERS ON REGULAR BASIS  . Lung cancer ([place])     LEFT UPPER LUNG CARCINOMA-NOT A CANDIDATE FOR SURGICAL RESECTION BECAUSE OF Derek COPD AND POOR CANDIDATE FOR RADIATION BECAUSE OF TRANSPORTATION PROBLEMS    Past Surgical History  Procedure Laterality Date  . Colon polyp excision    . Colon surgery    . Carotid surgery 2009 -left      Allergies: Review of patient's allergies indicates no known allergies.  Medications: Prior to Admission medications   Medication Sig Start Date End Date Taking? Authorizing Provider  aspirin 81 MG tablet Take 81 mg by mouth daily.   Yes Historical Provider, MD  atorvastatin (LIPITOR) 40 MG tablet Take 80 mg by mouth at bedtime.    Yes Historical Provider, MD  budesonide-formoterol (SYMBICORT) 160-4.5 MCG/ACT inhaler Inhale 1 puff into the lungs 2 (two) times daily.   Yes Historical Provider, MD  Cholecalciferol (VITAMIN D) 2000 UNITS CAPS Take by mouth. VITAMIN D 3 2000 IU  ONE SOFTGEL DAILY  --OTC   Yes Historical Provider, MD  clopidogrel (PLAVIX) 75 MG tablet  Take 75 mg by mouth daily.   Yes Historical Provider, MD  enalapril (VASOTEC) 10 MG tablet Take 10 mg by mouth daily.   Yes Historical Provider, MD  ezetimibe (ZETIA) 10 MG tablet Take 10 mg by mouth daily.   Yes Historical Provider, MD  pantoprazole (PROTONIX) 40 MG tablet Take 40 mg by mouth daily. IN AM   Yes Historical Provider, MD  phenytoin (DILANTIN) 100 MG ER capsule Take 400 mg by mouth at bedtime.   Yes Historical Provider, MD  aspirin 325 MG tablet Take 81 mg by mouth daily. Reported on 12/02/2015    Historical Provider, MD  atorvastatin (LIPITOR) 80 MG tablet Take 80 mg by mouth daily.    Historical Provider, MD  ferrous sulfate 325 (65 FE) MG tablet Take 325 mg by mouth daily with breakfast.    Historical Provider, MD  quinapril (ACCUPRIL) 10 MG tablet Take 10 mg by mouth at bedtime.    Historical Provider, MD     No family history on file.  Social History   Social History  . Marital Status: Single    Spouse Name: N/A  . Number of Children: N/A  . Years of Education: N/A   Social History Main Topics  . Smoking status: Former Smoker -- 1.00 packs/day for 35 years    Types: Cigarettes    Quit date: 12/22/2011  . Smokeless tobacco: Never Used  . Alcohol Use: Yes  Comment: BEER AND OTHER ALCOHOL DAILY --MAYBE 6 PACK BEER AND 1 PINT LIQUOR EVERY OTHER DAY--DENIES [facility]  . Drug Use: No  . Sexual Activity: Not Asked   Other Topics Concern  . None   Social History Narrative    ECOG Status: 0 - Asymptomatic  Review of Systems: A 12 point ROS discussed and pertinent positives are indicated in the HPI above.  All other systems are negative.  Review of Systems  Constitutional: Negative.   Respiratory: Negative.   Cardiovascular: Negative.   Gastrointestinal: Negative.   Genitourinary: Negative.   Musculoskeletal: Negative.   Neurological: Negative.      Vital Signs: BP 143/85 mmHg  Pulse 89  Temp(Src) 98 F (36.7 C) (Oral)  Resp 14  Ht _0  (1.702 m)   Wt 179 lb (81.194 kg)  BMI 28.03 kg/m2  SpO2 97%  Physical Exam  Constitutional: He is oriented to person, place, and time. He appears well-developed and well-nourished. No distress.  Cardiovascular: Normal rate, regular rhythm and normal heart sounds.  Exam reveals no gallop and no friction rub.   No murmur heard. Pulmonary/Chest: Effort normal and breath sounds normal. No respiratory distress. He has no wheezes. He has no rales.  Abdominal: Soft. He exhibits no distension. There is no tenderness.  Neurological: He is alert and oriented to person, place, and time.  Skin: He is not diaphoretic.  Nursing note and vitals reviewed.   Imaging: Ct Chest W Contrast  11/26/2015  CLINICAL DATA:  3.5 year follow-up microwave ablation of left upper lobe squamous cell carcinoma EXAM: CT CHEST WITH CONTRAST TECHNIQUE: Multidetector CT imaging of the chest was performed during intravenous contrast administration. CONTRAST:  60m ISOVUE-300 IOPAMIDOL (ISOVUE-300) INJECTION 61% COMPARISON:  CT chest dated 10/14/2014 FINDINGS: Mediastinum/Nodes: The heart is top-normal in size. No pericardial effusion. Coronary atherosclerosis in the LAD and left circumflex. Atherosclerotic calcifications of the aortic arch. Ectasia of the ascending thoracic aorta, measuring 4.0 cm. Small mediastinal lymph nodes, including an 8 mm short axis right paratracheal node (series 2/ image 61). Visualized thyroid is unremarkable. Lungs/Pleura: 3.0 x 1.2 x 0.9 mm plate-like opacity in the left 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series 3/ image 43), corresponding to the site of prior microwave ablation. This previously measured 2.2 x 1.4 x 1.3 cm when measured in a similar fashion. While the lesion has elongated in the AP dimension, if has contracted craniocaudally, and appears more band-like/fibrotic rather than mass-like on the current study, remaining compatible with post treatment changes. 4.2 x 2.4 x 2.4 cm irregular opacity along the lateral aspect of the  right middle lobe (series 3/image 99). While this previously appeared to reflect scarring, this is more mass-like on the current study and is progressive when compared to the priors. Mild to moderate centrilobular and paraseptal emphysematous changes, upper lobe predominant. No pleural effusion or pneumothorax. Upper abdomen: Visualized upper abdomen is notable for a calcified node in the porta hepatis and vascular calcifications. Musculoskeletal: Degenerative changes of the visualized thoracolumbar spine. IMPRESSION: 3.0 x 1.2 x 0.9 cm plate-like opacity in the left upper lobe, corresponding to the site of prior microwave ablation, without findings suspicious for viable tumor. Progressive 4.2 x 2.4 x 2.4 cm irregular opacity in the lateral right middle lobe. While this may reflect superimposed infection at the site of prior scarring, malignancy is not excluded. Consider short-term follow-up CT chest in 3-4 weeks. If persistent, PET-CT is suggested. These results will be called to the ordering clinician or representative by the Radiologist  Assistant, and communication documented in the PACS or zVision Dashboard. Electronically Signed   By: Julian Hy M.D.   On: 11/26/2015 10:38    Labs:  CBC: No results for input(s): WBC, HGB, HCT, PLT in the last 8760 hours.  COAGS: No results for input(s): INR, APTT in the last 8760 hours.  BMP:  Recent Labs  11/26/15 0923  CREATININE 0.60*    Assessment and Plan:  I met with Derek Webb and Derek Webb. The left upper lobe ablation site continues to show excellent response post thermal ablation with platelike area of scarring remaining and no obvious tumor recurrence. The most recent scan demonstrates some change in appearance of scarring in the right middle lobe near the juncture of the major and minor fissures. There is some more parenchymal nodularity adjacent to visible thickening of the major and minor fissures which may represent a developing  pulmonary neoplasm. This appears atypical, however, for carcinoma. The patient currently shows no signs or symptoms of active pneumonia. I recommended a follow-up CT in approximately 4-6 weeks to recheck this finding. If this is persistent or progressive, a repeat PET scan may be helpful in determining level of suspicion of malignancy. Derek Webb is agreeable to this plan.   Electronically SignedAletta Edouard T 12/02/2015, 5:12 PM   I spent a total of 15 Minutes in face to face in clinical consultation, greater than 50% of which was counseling/coordinating care post left lung carcinoma ablation.

## 2015-12-03 ENCOUNTER — Other Ambulatory Visit: Payer: Self-pay | Admitting: Radiology

## 2015-12-03 ENCOUNTER — Other Ambulatory Visit: Payer: Self-pay | Admitting: Interventional Radiology

## 2015-12-03 DIAGNOSIS — C3492 Malignant neoplasm of unspecified part of left bronchus or lung: Secondary | ICD-10-CM

## 2015-12-04 ENCOUNTER — Encounter: Payer: Self-pay | Admitting: Radiology

## 2015-12-22 ENCOUNTER — Telehealth: Payer: Self-pay | Admitting: *Deleted

## 2015-12-22 NOTE — Telephone Encounter (Signed)
Contacted sister as she reports patient can not hear well on phone. Notified that Dr. Oliva Bustard would like for patient to see Dr. Rogue Bussing and Dr. Genevive Bi for ongoing care and that our schedulers will contact her with an appointment.

## 2015-12-24 ENCOUNTER — Telehealth: Payer: Self-pay | Admitting: *Deleted

## 2015-12-24 NOTE — Telephone Encounter (Signed)
Contacted sister to give appt to see medical oncology and thoracic surgery as previously discussed. Sister would like to wait until next followup with Dr. Kathlene Cote before scheduling any other appointments.

## 2015-12-30 ENCOUNTER — Other Ambulatory Visit (HOSPITAL_COMMUNITY): Payer: Self-pay | Admitting: Interventional Radiology

## 2016-01-07 ENCOUNTER — Other Ambulatory Visit (HOSPITAL_COMMUNITY): Payer: Self-pay | Admitting: Interventional Radiology

## 2016-01-12 ENCOUNTER — Other Ambulatory Visit
Admission: RE | Admit: 2016-01-12 | Discharge: 2016-01-12 | Disposition: A | Discharge status: Home / Self Care | Payer: Medicaid Other | Source: Ambulatory Visit | Attending: Interventional Radiology | Admitting: Interventional Radiology

## 2016-01-12 DIAGNOSIS — C3492 Malignant neoplasm of unspecified part of left bronchus or lung: Secondary | ICD-10-CM | POA: Insufficient documentation

## 2016-01-12 LAB — CREATININE, SERUM
Creatinine, Ser: 0.61 mg/dL (ref 0.61–1.24)
GFR calc non Af Amer: >60 mL/min (ref >60)

## 2016-01-12 LAB — BUN: BUN: 6 mg/dL (ref 6–20)

## 2016-01-14 ENCOUNTER — Encounter: Payer: Self-pay | Admitting: Interventional Radiology

## 2016-01-14 ENCOUNTER — Ambulatory Visit
Admission: RE | Admit: 2016-01-14 | Discharge: 2016-01-14 | Disposition: A | Discharge status: Home / Self Care | Payer: Medicaid Other | Source: Ambulatory Visit | Attending: Interventional Radiology | Admitting: Interventional Radiology

## 2016-01-14 DIAGNOSIS — Z9889 Other specified postprocedural states: Secondary | ICD-10-CM | POA: Insufficient documentation

## 2016-01-14 DIAGNOSIS — I2510 Atherosclerotic heart disease of native coronary artery without angina pectoris: Secondary | ICD-10-CM | POA: Diagnosis not present

## 2016-01-14 DIAGNOSIS — C3492 Malignant neoplasm of unspecified part of left bronchus or lung: Secondary | ICD-10-CM | POA: Diagnosis not present

## 2016-01-14 MED ORDER — IOPAMIDOL (ISOVUE-300) INJECTION 61%
75.0000 mL | Freq: Once | INTRAVENOUS | Status: AC | PRN
  Administered 2016-01-14: 75 mL via INTRAVENOUS

## 2016-01-16 ENCOUNTER — Ambulatory Visit: Payer: Medicaid Other | Admitting: Internal Medicine

## 2016-01-16 ENCOUNTER — Ambulatory Visit: Payer: Self-pay | Admitting: Cardiothoracic Surgery

## 2016-01-21 ENCOUNTER — Ambulatory Visit
Admission: RE | Admit: 2016-01-21 | Discharge: 2016-01-21 | Disposition: A | Discharge status: Home / Self Care | Payer: Medicaid Other | Source: Ambulatory Visit | Attending: Interventional Radiology | Admitting: Interventional Radiology

## 2016-01-21 DIAGNOSIS — C3492 Malignant neoplasm of unspecified part of left bronchus or lung: Secondary | ICD-10-CM

## 2016-01-21 HISTORY — PX: IR GENERIC HISTORICAL: IMG1180011

## 2016-01-21 NOTE — Progress Notes (Signed)
Patient ID: Derek Webb, male   DOB: [birth year], 65 y.o.   MRN: [id number]   Referring Physician(s): [facility]  Chief Complaint: The patient is seen in follow up today s/p percutaneous thermal ablation of a left upper lobe squamous lung carcinoma on 05/05/2012.  History of present illness:  Derek Webb is a 65 year old male who is status post percutaneous thermal ablation of a left upper lobe squamous lung carcinoma on 05/05/2012. He saw Dr. Kathlene Cote about a month and a half ago after his annual CT scan. There was a concerning area noted in his right lung. Therefore, a recommendation for a follow-up CT scan in 4-6 weeks was made. He obtained his repeat CT scan just a few days ago. He presents today for follow-up. This repeat CT scan of his chest no longer reveals any concerning areas in the right lung. The patient currently denies any symptoms such as chest pain, shortness of breath, fevers, hemoptysis, or general malaise.  Past Medical History  Diagnosis Date  . Stroke ([place])     2 TO 3 YRS AGO-AFFECTED HIS SPEECH--ABLE TO AMBULATE WITHOUT ASSIST AND DOES YARD WORK.  DECREASED HEARING IN BOTH EARS SINCE STROKE.  Marland Kitchen Elevated cholesterol   . Hypertension   . Seizures ([place])     CHRONIC DILATIN - PT STATES NO SEIZURES IN PAST COUPLE OF YEARS; PT STATES HIS SEIZURES CAUSED NUMBNESS OF ARMS AND HANDS AND NOT ABLE TO MOVE HIS ARMS  . COPD (chronic obstructive pulmonary disease) (HCC)     SEVERE COPD -OCCASIONALLY USES OXYGEN AT NIGHT-DOES NOT USE INHALERS ON REGULAR BASIS  . Lung cancer ([place])     LEFT UPPER LUNG CARCINOMA-NOT A CANDIDATE FOR SURGICAL RESECTION BECAUSE OF HIS COPD AND POOR CANDIDATE FOR RADIATION BECAUSE OF TRANSPORTATION PROBLEMS    Past Surgical History  Procedure Laterality Date  . Colon polyp excision    . Colon surgery    . Carotid surgery 2009 -left      Allergies: Review of patient's allergies indicates no known allergies.  Medications: Prior to Admission  medications   Medication Sig Start Date End Date Taking? Authorizing Provider  aspirin 81 MG tablet Take 81 mg by mouth daily.   Yes Historical Provider, MD  atorvastatin (LIPITOR) 40 MG tablet Take 80 mg by mouth at bedtime.    Yes Historical Provider, MD  budesonide-formoterol (SYMBICORT) 160-4.5 MCG/ACT inhaler Inhale 1 puff into the lungs 2 (two) times daily.   Yes Historical Provider, MD  Cholecalciferol (VITAMIN D) 2000 UNITS CAPS Take by mouth. VITAMIN D 3 2000 IU  ONE SOFTGEL DAILY  --OTC   Yes Historical Provider, MD  clopidogrel (PLAVIX) 75 MG tablet Take 75 mg by mouth daily.   Yes Historical Provider, MD  enalapril (VASOTEC) 10 MG tablet Take 10 mg by mouth daily.   Yes Historical Provider, MD  ezetimibe (ZETIA) 10 MG tablet Take 10 mg by mouth daily.   Yes Historical Provider, MD  pantoprazole (PROTONIX) 40 MG tablet Take 40 mg by mouth daily. IN AM   Yes Historical Provider, MD  phenytoin (DILANTIN) 100 MG ER capsule Take 400 mg by mouth at bedtime.   Yes Historical Provider, MD  quinapril (ACCUPRIL) 10 MG tablet Take 10 mg by mouth at bedtime.   Yes Historical Provider, MD  aspirin 325 MG tablet Take 81 mg by mouth daily. Reported on 01/21/2016    Historical Provider, MD  atorvastatin (LIPITOR) 80 MG tablet Take 80 mg by mouth daily.  Historical Provider, MD  ferrous sulfate 325 (65 FE) MG tablet Take 325 mg by mouth daily with breakfast. Reported on 01/21/2016    Historical Provider, MD     No family history on file.  Social History   Social History  . Marital Status: Single    Spouse Name: N/A  . Number of Children: N/A  . Years of Education: N/A   Social History Main Topics  . Smoking status: Former Smoker -- 1.00 packs/day for 35 years    Types: Cigarettes    Quit date: 12/22/2011  . Smokeless tobacco: Never Used  . Alcohol Use: Yes     Comment: BEER AND OTHER ALCOHOL DAILY --MAYBE 6 PACK BEER AND 1 PINT LIQUOR EVERY OTHER DAY--DENIES [facility]  . Drug Use: No  .  Sexual Activity: Not on file   Other Topics Concern  . Not on file   Social History Narrative     Vital Signs: BP 113/75 mmHg  Pulse 64  Temp(Src) 98.1 F (36.7 C) (Oral)  Resp 14  Ht '5\' 7"'$  (1.702 m)  Wt 174 lb (78.926 kg)  BMI 27.25 kg/m2  SpO2 98%  Physical Exam  General: Pleasant, well-developed, well-nourished black male sitting up in a chair in no acute distress. Heart: Regular, rate, rhythm. No murmurs, gallops, or rubs noted. Lungs: Clear to auscultation bilaterally. No wheezes, rhonchi, or rales noted. Respiratory effort is nonlabored. Abdomen: Soft, nontender, nondistended, active bowel sounds  Imaging: CT scan of the chest: 1. Stable scarring in the left upper lobe after microwave ablation. No evidence of recurrence of carcinoma. 2. No mediastinal or hilar adenopathy. 3. Resolution of probable atelectasis in the right mid lobe noted previously. 4. 4.0 cm mid ascending aorta. Followup recommendations are given above. 5. Some calcification is noted in the distribution of the left anterior descending coronary artery.  Labs:  CBC: No results for input(s): WBC, HGB, HCT, PLT in the last 8760 hours.  COAGS: No results for input(s): INR, APTT in the last 8760 hours.  BMP:  Recent Labs  11/26/15 0923 01/12/16 1425  BUN  --  6  CREATININE 0.60* 0.61  GFRNONAA  --  >60  GFRAA  --  >60    LIVER FUNCTION TESTS: No results for input(s): BILITOT, AST, ALT, ALKPHOS, PROT, ALBUMIN in the last 8760 hours.  Assessment:  1. Status post percutaneous thermal ablation of a left upper lobe squamous lung carcinoma on 05/05/2012  The patient's repeat CT scan of the chest reveals resolution of the previous finding in the right lung. The patient currently still appears to be cancer free. He has no concerning symptoms or radiologic findings.  At this time, he will need to follow up with [place] in one year for surveillance CT scan of the chest with contrast and his routine  office visit after the CT scan is completed.  Signed: Henreitta Cea 01/21/2016, 2:59 PM   Please refer to Dr. Kathlene Cote attestation of this note for management and plan.

## 2016-05-25 ENCOUNTER — Encounter: Payer: Self-pay | Admitting: Interventional Radiology

## 2016-08-25 ENCOUNTER — Encounter: Payer: Self-pay | Admitting: Interventional Radiology

## 2016-11-25 ENCOUNTER — Other Ambulatory Visit (HOSPITAL_COMMUNITY): Payer: Self-pay | Admitting: Interventional Radiology

## 2016-11-25 DIAGNOSIS — C3492 Malignant neoplasm of unspecified part of left bronchus or lung: Secondary | ICD-10-CM

## 2016-12-16 DIAGNOSIS — Z86010 Personal history of colonic polyps: Secondary | ICD-10-CM | POA: Insufficient documentation

## 2016-12-16 DIAGNOSIS — Z860101 Personal history of adenomatous and serrated colon polyps: Secondary | ICD-10-CM | POA: Insufficient documentation

## 2016-12-21 ENCOUNTER — Other Ambulatory Visit: Payer: Self-pay | Admitting: Radiology

## 2017-01-26 ENCOUNTER — Ambulatory Visit
Admission: RE | Admit: 2017-01-26 | Discharge: 2017-01-26 | Disposition: A | Discharge status: Home / Self Care | Payer: Medicaid Other | Source: Ambulatory Visit | Attending: Interventional Radiology | Admitting: Interventional Radiology

## 2017-01-26 DIAGNOSIS — I700 Atherosclerosis of aorta: Secondary | ICD-10-CM | POA: Insufficient documentation

## 2017-01-26 DIAGNOSIS — C3492 Malignant neoplasm of unspecified part of left bronchus or lung: Secondary | ICD-10-CM | POA: Diagnosis present

## 2017-01-26 DIAGNOSIS — I2510 Atherosclerotic heart disease of native coronary artery without angina pectoris: Secondary | ICD-10-CM | POA: Insufficient documentation

## 2017-01-26 DIAGNOSIS — J432 Centrilobular emphysema: Secondary | ICD-10-CM | POA: Insufficient documentation

## 2017-01-26 DIAGNOSIS — J438 Other emphysema: Secondary | ICD-10-CM | POA: Diagnosis not present

## 2017-01-26 MED ORDER — IOPAMIDOL (ISOVUE-300) INJECTION 61%
75.0000 mL | Freq: Once | INTRAVENOUS | Status: AC | PRN
  Administered 2017-01-26: 75 mL via INTRAVENOUS

## 2017-02-02 ENCOUNTER — Ambulatory Visit
Admission: RE | Admit: 2017-02-02 | Discharge: 2017-02-02 | Disposition: A | Discharge status: Home / Self Care | Payer: Medicaid Other | Source: Ambulatory Visit | Attending: Interventional Radiology | Admitting: Interventional Radiology

## 2017-02-02 DIAGNOSIS — C3492 Malignant neoplasm of unspecified part of left bronchus or lung: Secondary | ICD-10-CM

## 2017-02-02 HISTORY — PX: IR RADIOLOGIST EVAL & MGMT: IMG5224

## 2017-02-02 NOTE — Progress Notes (Signed)
Chief Complaint: Status post microwave thermal ablation of left upper lobe squamous cell lung carcinoma on 05/05/2012.  History of Present Illness: Derek Webb is a 66 y.o. male now almost 5 years post percutaneous thermal ablation of a 2.2 x 2.9 cm left upper lobe squamous carcinoma. He has done extremely well after treatment with no evidence of recurrence after a single ablation treatment. He has not required any radiation therapy or chemotherapy and has not had any evidence of metastatic disease. He is currently asymptomatic.  Past Medical History:  Diagnosis Date  . COPD (chronic obstructive pulmonary disease) (HCC)    SEVERE COPD -OCCASIONALLY USES OXYGEN AT NIGHT-DOES NOT USE INHALERS ON REGULAR BASIS  . Elevated cholesterol   . Hypertension   . Lung cancer ([place])    LEFT UPPER LUNG CARCINOMA-NOT A CANDIDATE FOR SURGICAL RESECTION BECAUSE OF HIS COPD AND POOR CANDIDATE FOR RADIATION BECAUSE OF TRANSPORTATION PROBLEMS  . Seizures ([place])    CHRONIC DILATIN - PT STATES NO SEIZURES IN PAST COUPLE OF YEARS; PT STATES HIS SEIZURES CAUSED NUMBNESS OF ARMS AND HANDS AND NOT ABLE TO MOVE HIS ARMS  . Stroke ([place])    2 TO 3 YRS AGO-AFFECTED HIS SPEECH--ABLE TO AMBULATE WITHOUT ASSIST AND DOES YARD WORK.  DECREASED HEARING IN BOTH EARS SINCE STROKE.    Past Surgical History:  Procedure Laterality Date  . CAROTID SURGERY 2009 -LEFT    . COLON POLYP EXCISION    . COLON SURGERY    . IR GENERIC HISTORICAL  01/21/2016   IR RADIOLOGIST EVAL & MGMT 01/21/2016 Aletta Edouard, MD [facility] INTERV RAD  . IR GENERIC HISTORICAL  10/22/2014   IR RADIOLOGIST EVAL & MGMT 10/22/2014 Aletta Edouard, MD [facility] INTERV RAD    Allergies: Patient has no known allergies.  Medications: Prior to Admission medications   Medication Sig Start Date End Date Taking? Authorizing Provider  aspirin 81 MG tablet Take 81 mg by mouth daily.   Yes [provider]  atorvastatin (LIPITOR) 40 MG tablet Take 80 mg  by mouth at bedtime.    Yes [provider]  budesonide-formoterol (SYMBICORT) 160-4.5 MCG/ACT inhaler Inhale 1 puff into the lungs 2 (two) times daily.   Yes [provider]  Cholecalciferol (VITAMIN D) 2000 UNITS CAPS Take by mouth. VITAMIN D 3 2000 IU  ONE SOFTGEL DAILY  --OTC   Yes [provider]  clopidogrel (PLAVIX) 75 MG tablet Take 75 mg by mouth daily.   Yes [provider]  enalapril (VASOTEC) 10 MG tablet Take 10 mg by mouth daily.   Yes [provider]  ezetimibe (ZETIA) 10 MG tablet Take 10 mg by mouth daily.   Yes [provider]  pantoprazole (PROTONIX) 40 MG tablet Take 40 mg by mouth daily. IN AM   Yes [provider]  phenytoin (DILANTIN) 100 MG ER capsule Take 400 mg by mouth at bedtime.   Yes [provider]  aspirin 325 MG tablet Take 81 mg by mouth daily. Reported on 01/21/2016    [provider]  atorvastatin (LIPITOR) 80 MG tablet Take 80 mg by mouth daily.    [provider]  ferrous sulfate 325 (65 FE) MG tablet Take 325 mg by mouth daily with breakfast. Reported on 01/21/2016    [provider]  quinapril (ACCUPRIL) 10 MG tablet Take 10 mg by mouth at bedtime.    [provider]     No family history on file.  Social History  Social History  . Marital status: Single    Spouse name: N/A  . Number of children: N/A  . Years of education: N/A   Social History Main Topics  . Smoking status: Former Smoker    Packs/day: 1.00    Years: 35.00    Types: Cigarettes    Quit date: 12/22/2011  . Smokeless tobacco: Never Used  . Alcohol use Yes     Comment: BEER AND OTHER ALCOHOL DAILY --MAYBE 6 PACK BEER AND 1 PINT LIQUOR EVERY OTHER DAY--DENIES [facility]  . Drug use: No  . Sexual activity: Not on file   Other Topics Concern  . Not on file   Social History Narrative  . No narrative on file    ECOG Status: 0 - Asymptomatic  Review of Systems: A 12 point  ROS discussed and pertinent positives are indicated in the HPI above.  All other systems are negative.  Review of Systems  Constitutional: Negative.   Respiratory: Negative.   Cardiovascular: Negative.   Gastrointestinal: Negative.   Genitourinary: Negative.   Musculoskeletal: Negative.   Neurological: Negative.     Vital Signs: BP 111/77   Pulse 63   Temp 98 F (36.7 C) (Oral)   Resp 16   Ht 5\' 7"  (1.702 m)   Wt 182 lb (82.6 kg)   SpO2 99%   BMI 28.51 kg/m   Physical Exam  Constitutional: He is oriented to person, place, and time. No distress.  Cardiovascular: Normal rate, regular rhythm and normal heart sounds.  Exam reveals no gallop and no friction rub.   No murmur heard. Pulmonary/Chest: Effort normal and breath sounds normal. No respiratory distress. He has no wheezes. He has no rales.  Musculoskeletal: He exhibits no edema.  Neurological: He is alert and oriented to person, place, and time.  Skin: He is not diaphoretic.  Vitals reviewed.   Imaging: Ct Chest W Contrast  Result Date: 01/27/2017 CLINICAL DATA:  66 year old male with history of left upper lobe lung cancer status post thermal ablation in 2013. Followup study. EXAM: CT CHEST WITH CONTRAST TECHNIQUE: Multidetector CT imaging of the chest was performed during intravenous contrast administration. CONTRAST:  29mL ISOVUE-300 IOPAMIDOL (ISOVUE-300) INJECTION 61% COMPARISON:  Chest CT 01/14/2016. FINDINGS: Cardiovascular: Heart size is normal. There is no significant pericardial fluid, thickening or pericardial calcification. There is aortic atherosclerosis, as well as atherosclerosis of the great vessels of the mediastinum and the coronary arteries, including calcified atherosclerotic plaque in the left main and left anterior descending coronary arteries. Small amount of gas non dependently in the right ventricle, presumably iatrogenic. Mediastinum/Nodes: No pathologically enlarged mediastinal or hilar lymph nodes.  Esophagus is unremarkable in appearance. No axillary lymphadenopathy. Lungs/Pleura: Again noted is a elongated area of mass-like architectural distortion in the left upper lobe measuring 3.1 x 1.2 cm on today's study (axial image 53 of series 3), very similar to prior examinations, compatible with an area of post ablation scarring. No other suspicious appearing pulmonary nodules or masses are noted. No acute consolidative airspace disease. No pleural effusions. Mild diffuse bronchial wall thickening with mild centrilobular and paraseptal emphysema. Upper Abdomen: Bilateral adrenal gland thickening and 2.1 cm nodule in the left adrenal gland, stable compared to prior examinations. Left adrenal gland nodule previously demonstrated low attenuation and no hypermetabolic activity on remote PET-CT from 2013, compatible with an adenoma. Calcified hepatoduodenal ligament lymph node incidentally noted, similar to prior studies. Aortic atherosclerosis. Musculoskeletal: There are no aggressive appearing lytic or blastic lesions noted in  the visualized portions of the skeleton. IMPRESSION: 1. Stable post treatment related changes of thermal ablation in the left upper lobe, without evidence to suggest local recurrence of disease or new metastatic disease in the thorax. 2. Diffuse bronchial wall thickening with mild centrilobular and paraseptal emphysema; imaging findings suggestive of underlying COPD. 3. Aortic atherosclerosis, in addition to left main and left anterior descending coronary artery disease. Please note that although the presence of coronary artery calcium documents the presence of coronary artery disease, the severity of this disease and any potential stenosis cannot be assessed on this non-gated CT examination. Assessment for potential risk factor modification, dietary therapy or pharmacologic therapy may be warranted, if clinically indicated. 4. Additional incidental findings, as above. Electronically Signed    By: Vinnie Langton M.D.   On: 01/27/2017 09:08    Assessment and Plan:  I reviewed the recent follow-up chest CT performed on 01/26/2017 with Mr. Rakestraw and his sister. This demonstrates stable linear scarring in the left upper lobe at the level of previous ablated squamous carcinoma with no evidence of tumor recurrence. No new pulmonary nodules are identified. There is no evidence of lymphadenopathy. There is a stable low-density left adrenal nodule.  I recommended that we perform another follow-up CT of the chest in one year. Given how well the scans have looked after ablation, we may be able to increase the interval of scans possibly to every other year at that time should there continue to be no evidence of recurrent malignancy.  Electronically SignedAletta Edouard T 02/02/2017, 3:08 PM   I spent a total of 15 Minutes in face to face in clinical consultation, greater than 50% of which was counseling/coordinating care post percutaneous thermal ablation of a left upper lobe squamous lung carcinoma.

## 2017-02-07 ENCOUNTER — Encounter: Payer: Self-pay | Admitting: Interventional Radiology

## 2017-04-15 ENCOUNTER — Encounter: Payer: Self-pay | Admitting: *Deleted

## 2017-04-18 ENCOUNTER — Ambulatory Visit: Payer: Medicaid Other | Admitting: Anesthesiology

## 2017-04-18 ENCOUNTER — Ambulatory Visit
Admission: RE | Admit: 2017-04-18 | Discharge: 2017-04-18 | Disposition: A | Discharge status: Home / Self Care | Payer: Medicaid Other | Source: Ambulatory Visit | Attending: Unknown Physician Specialty | Admitting: Unknown Physician Specialty

## 2017-04-18 ENCOUNTER — Encounter: Payer: Self-pay | Admitting: Anesthesiology

## 2017-04-18 ENCOUNTER — Encounter
Admission: RE | Disposition: A | Discharge status: Home / Self Care | Payer: Self-pay | Source: Ambulatory Visit | Attending: Unknown Physician Specialty

## 2017-04-18 DIAGNOSIS — K621 Rectal polyp: Secondary | ICD-10-CM | POA: Insufficient documentation

## 2017-04-18 DIAGNOSIS — I739 Peripheral vascular disease, unspecified: Secondary | ICD-10-CM | POA: Insufficient documentation

## 2017-04-18 DIAGNOSIS — Z1211 Encounter for screening for malignant neoplasm of colon: Secondary | ICD-10-CM | POA: Insufficient documentation

## 2017-04-18 DIAGNOSIS — D123 Benign neoplasm of transverse colon: Secondary | ICD-10-CM | POA: Insufficient documentation

## 2017-04-18 DIAGNOSIS — Z8673 Personal history of transient ischemic attack (TIA), and cerebral infarction without residual deficits: Secondary | ICD-10-CM | POA: Insufficient documentation

## 2017-04-18 DIAGNOSIS — E559 Vitamin D deficiency, unspecified: Secondary | ICD-10-CM | POA: Diagnosis not present

## 2017-04-18 DIAGNOSIS — R569 Unspecified convulsions: Secondary | ICD-10-CM | POA: Diagnosis not present

## 2017-04-18 DIAGNOSIS — D122 Benign neoplasm of ascending colon: Secondary | ICD-10-CM | POA: Insufficient documentation

## 2017-04-18 DIAGNOSIS — K219 Gastro-esophageal reflux disease without esophagitis: Secondary | ICD-10-CM | POA: Insufficient documentation

## 2017-04-18 DIAGNOSIS — Z86010 Personal history of colonic polyps: Secondary | ICD-10-CM | POA: Insufficient documentation

## 2017-04-18 DIAGNOSIS — Z7902 Long term (current) use of antithrombotics/antiplatelets: Secondary | ICD-10-CM | POA: Diagnosis not present

## 2017-04-18 DIAGNOSIS — Z87891 Personal history of nicotine dependence: Secondary | ICD-10-CM | POA: Insufficient documentation

## 2017-04-18 DIAGNOSIS — Z85118 Personal history of other malignant neoplasm of bronchus and lung: Secondary | ICD-10-CM | POA: Diagnosis not present

## 2017-04-18 DIAGNOSIS — I10 Essential (primary) hypertension: Secondary | ICD-10-CM | POA: Diagnosis not present

## 2017-04-18 DIAGNOSIS — Z7951 Long term (current) use of inhaled steroids: Secondary | ICD-10-CM | POA: Insufficient documentation

## 2017-04-18 DIAGNOSIS — E7800 Pure hypercholesterolemia, unspecified: Secondary | ICD-10-CM | POA: Diagnosis not present

## 2017-04-18 DIAGNOSIS — Z79899 Other long term (current) drug therapy: Secondary | ICD-10-CM | POA: Diagnosis not present

## 2017-04-18 DIAGNOSIS — Z9981 Dependence on supplemental oxygen: Secondary | ICD-10-CM | POA: Insufficient documentation

## 2017-04-18 DIAGNOSIS — Z8371 Family history of colonic polyps: Secondary | ICD-10-CM | POA: Insufficient documentation

## 2017-04-18 DIAGNOSIS — K5730 Diverticulosis of large intestine without perforation or abscess without bleeding: Secondary | ICD-10-CM | POA: Diagnosis not present

## 2017-04-18 DIAGNOSIS — D125 Benign neoplasm of sigmoid colon: Secondary | ICD-10-CM | POA: Diagnosis not present

## 2017-04-18 DIAGNOSIS — Z9889 Other specified postprocedural states: Secondary | ICD-10-CM | POA: Diagnosis not present

## 2017-04-18 DIAGNOSIS — Z7982 Long term (current) use of aspirin: Secondary | ICD-10-CM | POA: Diagnosis not present

## 2017-04-18 DIAGNOSIS — J449 Chronic obstructive pulmonary disease, unspecified: Secondary | ICD-10-CM | POA: Insufficient documentation

## 2017-04-18 DIAGNOSIS — F1020 Alcohol dependence, uncomplicated: Secondary | ICD-10-CM | POA: Diagnosis not present

## 2017-04-18 HISTORY — DX: Alcohol dependence, uncomplicated: F10.20

## 2017-04-18 HISTORY — DX: Gastro-esophageal reflux disease without esophagitis: K21.9

## 2017-04-18 HISTORY — PX: COLONOSCOPY WITH PROPOFOL: SHX5780

## 2017-04-18 HISTORY — DX: Unspecified hearing loss, unspecified ear: H91.90

## 2017-04-18 HISTORY — DX: Vitamin D deficiency, unspecified: E55.9

## 2017-04-18 SURGERY — COLONOSCOPY WITH PROPOFOL
Anesthesia: General

## 2017-04-18 MED ORDER — FENTANYL CITRATE (PF) 100 MCG/2ML IJ SOLN
INTRAMUSCULAR | Status: AC
  Filled 2017-04-18: qty 2

## 2017-04-18 MED ORDER — PROPOFOL 500 MG/50ML IV EMUL
INTRAVENOUS | Status: DC | PRN
  Administered 2017-04-18: 120 ug/kg/min via INTRAVENOUS

## 2017-04-18 MED ORDER — MIDAZOLAM HCL 2 MG/2ML IJ SOLN
INTRAMUSCULAR | Status: DC | PRN
  Administered 2017-04-18: 2 mg via INTRAVENOUS

## 2017-04-18 MED ORDER — FENTANYL CITRATE (PF) 100 MCG/2ML IJ SOLN
INTRAMUSCULAR | Status: DC | PRN
  Administered 2017-04-18 (×2): 50 ug via INTRAVENOUS

## 2017-04-18 MED ORDER — SODIUM CHLORIDE 0.9 % IV SOLN: INTRAVENOUS | Status: DC

## 2017-04-18 MED ORDER — MIDAZOLAM HCL 2 MG/2ML IJ SOLN
INTRAMUSCULAR | Status: AC
  Filled 2017-04-18: qty 2

## 2017-04-18 MED ORDER — SODIUM CHLORIDE 0.9 % IV SOLN
INTRAVENOUS | Status: DC
  Administered 2017-04-18: 09:00:00 via INTRAVENOUS

## 2017-04-18 MED ORDER — PROPOFOL 500 MG/50ML IV EMUL
INTRAVENOUS | Status: AC
  Filled 2017-04-18: qty 50

## 2017-04-18 NOTE — Anesthesia Postprocedure Evaluation (Signed)
Anesthesia Post Note  Patient: RIKKI SMESTAD  Procedure(s) Performed: Procedure(s) (LRB): COLONOSCOPY WITH PROPOFOL (N/A)  Patient location during evaluation: PACU Anesthesia Type: General Level of consciousness: awake and alert and oriented Pain management: pain level controlled Vital Signs Assessment: post-procedure vital signs reviewed and stable Respiratory status: spontaneous breathing Cardiovascular status: blood pressure returned to baseline Anesthetic complications: no     Last Vitals:  Vitals:   04/18/17 0951 04/18/17 1001  BP: (!) 157/68 (!) 155/83  Pulse: (!) 57 (!) 54  Resp: 14 16  Temp:    SpO2: 100% 100%    Last Pain:  Vitals:   04/18/17 0931  TempSrc: Tympanic                 Zacarias Krauter

## 2017-04-18 NOTE — Anesthesia Preprocedure Evaluation (Addendum)
Anesthesia Evaluation  Patient identified by MRN, date of birth, ID band Patient awake    Reviewed: Allergy & Precautions, H&P , NPO status , Patient's Chart, lab work & pertinent test results  Airway Mallampati: II  TM Distance: >3 FB Neck ROM: Full    Dental no notable dental hx. (+) Upper Dentures, Lower Dentures   Pulmonary COPD,  oxygen dependent, Current Smoker, former smoker,     + decreased breath sounds      Cardiovascular hypertension, Pt. on medications + Peripheral Vascular Disease  Normal cardiovascular exam Rhythm:Regular Rate:Normal     Neuro/Psych Seizures -, Well Controlled,  CVA negative psych ROS   GI/Hepatic negative GI ROS, Neg liver ROS, GERD  Medicated,(+)     substance abuse  alcohol use,   Endo/Other  negative endocrine ROS  Renal/GU negative Renal ROS  negative genitourinary   Musculoskeletal negative musculoskeletal ROS (+)   Abdominal   Peds negative pediatric ROS (+)  Hematology negative hematology ROS (+)   Anesthesia Other Findings   Reproductive/Obstetrics negative OB ROS                            Anesthesia Physical  Anesthesia Plan  ASA: III  Anesthesia Plan: General   Post-op Pain Management:    Induction: Intravenous  PONV Risk Score and Plan:   Airway Management Planned: Nasal Cannula  Additional Equipment:   Intra-op Plan:   Post-operative Plan:   Informed Consent: I have reviewed the patients History and Physical, chart, labs and discussed the procedure including the risks, benefits and alternatives for the proposed anesthesia with the patient or authorized representative who has indicated his/her understanding and acceptance.   Dental advisory given  Plan Discussed with: CRNA and Surgeon  Anesthesia Plan Comments:         Anesthesia Quick Evaluation

## 2017-04-18 NOTE — Transfer of Care (Signed)
Immediate Anesthesia Transfer of Care Note  Patient: [name]  Procedure(s) Performed: Procedure(s): COLONOSCOPY WITH PROPOFOL (N/A)  Patient Location: PACU  Anesthesia Type:General  Level of Consciousness: awake and sedated  Airway & Oxygen Therapy: Patient Spontanous Breathing and Patient connected to nasal cannula oxygen  Post-op Assessment: Report given to RN and Post -op Vital signs reviewed and stable  Post vital signs: Reviewed and stable  Last Vitals:  Vitals:   04/18/17 0840  BP: (!) 149/79  Pulse: 65  Resp: 16  Temp: (!) 35.9 C  SpO2: 100%    Last Pain:  Vitals:   04/18/17 0840  TempSrc: Tympanic         Complications: No apparent anesthesia complications

## 2017-04-18 NOTE — Anesthesia Procedure Notes (Signed)
Performed by: COOK-MARTIN, [place] Pre-anesthesia Checklist: Patient identified, Emergency Drugs available, Suction available, Patient being monitored and Timeout performed Patient Re-evaluated:Patient Re-evaluated prior to induction Oxygen Delivery Method: Nasal cannula Preoxygenation: Pre-oxygenation with 100% oxygen Induction Type: IV induction Placement Confirmation: positive ETCO2 and CO2 detector       

## 2017-04-18 NOTE — Anesthesia Post-op Follow-up Note (Signed)
Anesthesia QCDR form completed.        

## 2017-04-18 NOTE — H&P (Signed)
Primary Care Physician:  Lavera Guise, MD Primary Gastroenterologist:  Dr. Vira Agar  Pre-Procedure History & Physical: HPI:  Derek Webb is a 66 y.o. male is here for an colonoscopy.   Past Medical History:  Diagnosis Date  . Alcoholism ([place])   . COPD (chronic obstructive pulmonary disease) (HCC)    SEVERE COPD -OCCASIONALLY USES OXYGEN AT NIGHT-DOES NOT USE INHALERS ON REGULAR BASIS  . Elevated cholesterol   . GERD (gastroesophageal reflux disease)   . HOH (hard of hearing)   . Hypertension   . Lung cancer ([place])    LEFT UPPER LUNG CARCINOMA-NOT A CANDIDATE FOR SURGICAL RESECTION BECAUSE OF HIS COPD AND POOR CANDIDATE FOR RADIATION BECAUSE OF TRANSPORTATION PROBLEMS  . Seizures ([place])    CHRONIC DILATIN - PT STATES NO SEIZURES IN PAST COUPLE OF YEARS; PT STATES HIS SEIZURES CAUSED NUMBNESS OF ARMS AND HANDS AND NOT ABLE TO MOVE HIS ARMS  . Stroke ([place])    2 TO 3 YRS AGO-AFFECTED HIS SPEECH--ABLE TO AMBULATE WITHOUT ASSIST AND DOES YARD WORK.  DECREASED HEARING IN BOTH EARS SINCE STROKE.  Marland Kitchen Vitamin D deficiency     Past Surgical History:  Procedure Laterality Date  . CAROTID SURGERY 2009 -LEFT    . COLON POLYP EXCISION    . COLON SURGERY    . ESOPHAGOGASTRODUODENOSCOPY    . IR GENERIC HISTORICAL  01/21/2016   IR RADIOLOGIST EVAL & MGMT 01/21/2016 Aletta Edouard, MD [facility] INTERV RAD  . IR GENERIC HISTORICAL  10/22/2014   IR RADIOLOGIST EVAL & MGMT 10/22/2014 Aletta Edouard, MD [facility] INTERV RAD  . IR RADIOLOGIST EVAL & MGMT  02/02/2017  . percutaneous biopsy      Prior to Admission medications   Medication Sig Start Date End Date Taking? Authorizing Provider  enalapril (VASOTEC) 10 MG tablet Take 10 mg by mouth daily.   Yes [provider]  phenytoin (DILANTIN) 100 MG ER capsule Take 400 mg by mouth at bedtime.   Yes [provider]  quinapril (ACCUPRIL) 10 MG tablet Take 10 mg by mouth at bedtime.   Yes [provider]  aspirin 325 MG tablet Take  81 mg by mouth daily. Reported on 01/21/2016    [provider]  aspirin 81 MG tablet Take 81 mg by mouth daily.    [provider]  atorvastatin (LIPITOR) 40 MG tablet Take 80 mg by mouth at bedtime.     [provider]  atorvastatin (LIPITOR) 80 MG tablet Take 80 mg by mouth daily.    [provider]  budesonide-formoterol (SYMBICORT) 160-4.5 MCG/ACT inhaler Inhale 1 puff into the lungs 2 (two) times daily.    [provider]  Cholecalciferol (VITAMIN D) 2000 UNITS CAPS Take by mouth. VITAMIN D 3 2000 IU  ONE SOFTGEL DAILY  --OTC    [provider]  clopidogrel (PLAVIX) 75 MG tablet Take 75 mg by mouth daily.    [provider]  ezetimibe (ZETIA) 10 MG tablet Take 10 mg by mouth daily.    [provider]  ferrous sulfate 325 (65 FE) MG tablet Take 325 mg by mouth daily with breakfast. Reported on 01/21/2016    [provider]  pantoprazole (PROTONIX) 40 MG tablet Take 40 mg by mouth daily. IN AM    [provider]    Allergies as of 12/20/2016  . (No Known Allergies)    Family History  Problem Relation Age of Onset  . Colon polyps Sister  Social History   Social History  . Marital status: Single    Spouse name: N/A  . Number of children: N/A  . Years of education: N/A   Occupational History  . Not on file.   Social History Main Topics  . Smoking status: Former Smoker    Packs/day: 1.00    Years: 35.00    Types: Cigarettes    Quit date: 12/22/2011  . Smokeless tobacco: Never Used  . Alcohol use 8.4 oz/week    14 Cans of beer per week     Comment: BEER AND OTHER ALCOHOL DAILY --MAYBE 6 PACK BEER AND 1 PINT LIQUOR EVERY OTHER DAY--DENIES [facility]  . Drug use: No  . Sexual activity: Not on file   Other Topics Concern  . Not on file   Social History Narrative  . No narrative on file    Review of Systems: See HPI, otherwise negative ROS  Physical Exam: BP (!) 149/79   Pulse  65   Temp (!) 96.7 F (35.9 C) (Tympanic)   Resp 16   Ht 5\' 7"  (1.702 m)   Wt 82.6 kg (182 lb)   SpO2 100%   BMI 28.51 kg/m  General:   Alert,  pleasant and cooperative in NAD Head:  Normocephalic and atraumatic. Neck:  Supple; no masses or thyromegaly. Lungs:  Clear throughout to auscultation.    Heart:  Regular rate and rhythm. Abdomen:  Soft, nontender and nondistended. Normal bowel sounds, without guarding, and without rebound.   Neurologic:  Alert and  oriented x4;  grossly normal neurologically.  Impression/Plan: [name] is here for an colonoscopy to be performed for [facility] colon polyps  Risks, benefits, limitations, and alternatives regarding  colonoscopy have been reviewed with the patient.  Questions have been answered.  All parties agreeable.   Gaylyn Cheers, MD  04/18/2017, 8:56 AM

## 2017-04-18 NOTE — Op Note (Signed)
[facility] Gastroenterology Patient Name: Derek Webb Procedure Date: 04/18/2017 8:50 AM MRN: [id number] Account #: [id number] Date of Birth: 1951-04-22 Admit Type: Outpatient Age: 66 Room: [facility] ENDO ROOM 1 Gender: Male Note Status: Finalized Procedure:            Colonoscopy Indications:          High risk colon cancer surveillance: Personal history                        of colonic polyps Providers:            Manya Silvas, MD Referring MD:         Lavera Guise, MD (Referring MD) Medicines:            Propofol per Anesthesia Complications:        No immediate complications. Procedure:            Pre-Anesthesia Assessment:                       - After reviewing the risks and benefits, the patient                        was deemed in satisfactory condition to undergo the                        procedure.                       After obtaining informed consent, the colonoscope was                        passed under direct vision. Throughout the procedure,                        the patient's blood pressure, pulse, and oxygen                        saturations were monitored continuously. The                        Colonoscope was introduced through the anus and                        advanced to the the cecum, identified by appendiceal                        orifice and ileocecal valve. The colonoscopy was                        performed without difficulty. The patient tolerated the                        procedure well. The quality of the bowel preparation                        was excellent. Findings:      A small polyp was found in the hepatic flexure. The polyp was sessile.       The polyp was removed with a hot snare. Resection and retrieval were       complete.      A small polyp was found  in the ascending colon. The polyp was sessile.       The polyp was removed with a hot snare. Resection and retrieval were       complete.      Three sessile  polyps were found in the sigmoid colon. The polyps were       diminutive in size. These polyps were removed with a jumbo cold forceps.       Resection and retrieval were complete.      A diminutive polyp was found in the rectum. The polyp was sessile. The       polyp was removed with a jumbo cold forceps. Resection and retrieval       were complete.      A few small-mouthed diverticula were found in the sigmoid colon and       descending colon.      The exam was otherwise without abnormality. Impression:           - One small polyp at the hepatic flexure, removed with                        a hot snare. Resected and retrieved.                       - One small polyp in the ascending colon, removed with                        a hot snare. Resected and retrieved.                       - Three diminutive polyps in the sigmoid colon, removed                        with a jumbo cold forceps. Resected and retrieved.                       - One diminutive polyp in the rectum, removed with a                        jumbo cold forceps. Resected and retrieved.                       - Diverticulosis in the sigmoid colon and in the                        descending colon.                       - The examination was otherwise normal. Recommendation:       - Await pathology results. Manya Silvas, MD 04/18/2017 9:28:50 AM This report has been signed electronically. Number of Addenda: 0 Note Initiated On: 04/18/2017 8:50 AM Scope Withdrawal Time: 0 hours 12 minutes 36 seconds  Total Procedure Duration: 0 hours 20 minutes 5 seconds       [facility]

## 2017-04-19 ENCOUNTER — Encounter: Payer: Self-pay | Admitting: Unknown Physician Specialty

## 2017-04-19 LAB — SURGICAL PATHOLOGY

## 2017-09-06 ENCOUNTER — Ambulatory Visit: Payer: Self-pay | Admitting: Internal Medicine

## 2017-10-04 ENCOUNTER — Ambulatory Visit: Payer: Self-pay | Admitting: Internal Medicine

## 2017-10-18 ENCOUNTER — Ambulatory Visit: Payer: Self-pay | Admitting: Internal Medicine

## 2017-10-31 ENCOUNTER — Ambulatory Visit: Payer: Self-pay | Admitting: Internal Medicine

## 2017-11-01 ENCOUNTER — Ambulatory Visit: Payer: Medicaid Other | Admitting: Internal Medicine

## 2017-11-01 ENCOUNTER — Encounter: Payer: Self-pay | Admitting: Internal Medicine

## 2017-11-01 VITALS — BP 144/80 | HR 76 | Resp 16 | Ht 67.0 in | Wt 191.6 lb

## 2017-11-01 DIAGNOSIS — I639 Cerebral infarction, unspecified: Secondary | ICD-10-CM | POA: Diagnosis not present

## 2017-11-01 DIAGNOSIS — I10 Essential (primary) hypertension: Secondary | ICD-10-CM | POA: Insufficient documentation

## 2017-11-01 DIAGNOSIS — H9190 Unspecified hearing loss, unspecified ear: Secondary | ICD-10-CM | POA: Insufficient documentation

## 2017-11-01 DIAGNOSIS — E785 Hyperlipidemia, unspecified: Secondary | ICD-10-CM | POA: Insufficient documentation

## 2017-11-01 DIAGNOSIS — C3492 Malignant neoplasm of unspecified part of left bronchus or lung: Secondary | ICD-10-CM

## 2017-11-01 DIAGNOSIS — J449 Chronic obstructive pulmonary disease, unspecified: Secondary | ICD-10-CM | POA: Diagnosis not present

## 2017-11-01 NOTE — Progress Notes (Signed)
[facility] [place], [place] [address]  Pulmonary Sleep Medicine  Office Visit Note  Patient Name: Derek Webb DOB: 1951-01-25 MRN [id number]  Date of Service: 11/01/2017  Complaints/HPI: He has been doing well. He is scheduled for a CT scan follow up per his oncologist in [place]. Patient has no SOB noted. Denies having CP. He has no fevers or chills noted. Denies having hemoptysis.  He has done well overall has not had any admissions to the hospital.  Has not had any significant weight loss.  There has been no recurrence of his tumor.  The last CT scan he had done was last June and he is supposed to have another 1 is June.  He gets this schedule through his oncologist as already mentioned  ROS  General: (-) fever, (-) chills, (-) night sweats, (-) weakness Skin: (-) rashes, (-) itching,. Eyes: (-) visual changes, (-) redness, (-) itching. Nose and Sinuses: (-) nasal stuffiness or itchiness, (-) postnasal drip, (-) nosebleeds, (-) sinus trouble. Mouth and Throat: (-) sore throat, (-) hoarseness. Neck: (-) swollen glands, (-) enlarged thyroid, (-) neck pain. Respiratory: - cough, (-) bloody sputum, + shortness of breath, - wheezing. Cardiovascular: - ankle swelling, (-) chest pain. Lymphatic: (-) lymph node enlargement. Neurologic: (-) numbness, (-) tingling. Psychiatric: (-) anxiety, (-) depression   Current Medication: Outpatient Encounter Medications as of 11/01/2017  Medication Sig  . aspirin 325 MG tablet Take 81 mg by mouth daily. Reported on 01/21/2016  . aspirin 81 MG tablet Take 81 mg by mouth daily.  Marland Kitchen atorvastatin (LIPITOR) 40 MG tablet Take 80 mg by mouth at bedtime.   Marland Kitchen atorvastatin (LIPITOR) 80 MG tablet Take 80 mg by mouth daily.  . budesonide-formoterol (SYMBICORT) 160-4.5 MCG/ACT inhaler Inhale 1 puff into the lungs 2 (two) times daily.  . Cholecalciferol (VITAMIN D) 2000 UNITS CAPS Take by mouth. VITAMIN D 3 2000 IU  ONE SOFTGEL DAILY   --OTC  . clopidogrel (PLAVIX) 75 MG tablet Take 75 mg by mouth daily.  . enalapril (VASOTEC) 10 MG tablet Take 10 mg by mouth daily.  Marland Kitchen ezetimibe (ZETIA) 10 MG tablet Take 10 mg by mouth daily.  . ferrous sulfate 325 (65 FE) MG tablet Take 325 mg by mouth daily with breakfast. Reported on 01/21/2016  . pantoprazole (PROTONIX) 40 MG tablet Take 40 mg by mouth daily. IN AM  . phenytoin (DILANTIN) 100 MG ER capsule Take 400 mg by mouth at bedtime.  . quinapril (ACCUPRIL) 10 MG tablet Take 10 mg by mouth at bedtime.   No facility-administered encounter medications on file as of 11/01/2017.     Surgical History: Past Surgical History:  Procedure Laterality Date  . CAROTID SURGERY 2009 -LEFT    . COLON POLYP EXCISION    . COLON SURGERY    . COLONOSCOPY WITH PROPOFOL N/A 04/18/2017   Procedure: COLONOSCOPY WITH PROPOFOL;  Surgeon: Manya Silvas, MD;  Location: [facility] ENDOSCOPY;  Service: Endoscopy;  Laterality: N/A;  . ESOPHAGOGASTRODUODENOSCOPY    . IR GENERIC HISTORICAL  01/21/2016   IR RADIOLOGIST EVAL & MGMT 01/21/2016 Aletta Edouard, MD [facility] INTERV RAD  . IR GENERIC HISTORICAL  10/22/2014   IR RADIOLOGIST EVAL & MGMT 10/22/2014 Aletta Edouard, MD [facility] INTERV RAD  . IR RADIOLOGIST EVAL & MGMT  02/02/2017  . percutaneous biopsy      Medical History: Past Medical History:  Diagnosis Date  . Alcoholism ([place])   . COPD (chronic obstructive pulmonary disease) ([place])  SEVERE COPD -OCCASIONALLY USES OXYGEN AT NIGHT-DOES NOT USE INHALERS ON REGULAR BASIS  . Elevated cholesterol   . GERD (gastroesophageal reflux disease)   . HOH (hard of hearing)   . Hypertension   . Lung cancer ([place])    LEFT UPPER LUNG CARCINOMA-NOT A CANDIDATE FOR SURGICAL RESECTION BECAUSE OF HIS COPD AND POOR CANDIDATE FOR RADIATION BECAUSE OF TRANSPORTATION PROBLEMS  . Seizures ([place])    CHRONIC DILATIN - PT STATES NO SEIZURES IN PAST COUPLE OF YEARS; PT STATES HIS SEIZURES CAUSED NUMBNESS OF ARMS AND HANDS AND NOT ABLE  TO MOVE HIS ARMS  . Stroke ([place])    2 TO 3 YRS AGO-AFFECTED HIS SPEECH--ABLE TO AMBULATE WITHOUT ASSIST AND DOES YARD WORK.  DECREASED HEARING IN BOTH EARS SINCE STROKE.  Marland Kitchen Vitamin D deficiency     Family History: Family History  Problem Relation Age of Onset  . Colon polyps Sister     Social History: Social History   Socioeconomic History  . Marital status: Single    Spouse name: Not on file  . Number of children: Not on file  . Years of education: Not on file  . Highest education level: Not on file  Social Needs  . Financial resource strain: Not on file  . Food insecurity - worry: Not on file  . Food insecurity - inability: Not on file  . Transportation needs - medical: Not on file  . Transportation needs - non-medical: Not on file  Occupational History  . Not on file  Tobacco Use  . Smoking status: Former Smoker    Packs/day: 1.00    Years: 35.00    Pack years: 35.00    Types: Cigarettes    Last attempt to quit: 12/22/2011    Years since quitting: 5.8  . Smokeless tobacco: Never Used  Substance and Sexual Activity  . Alcohol use: Yes    Alcohol/week: 8.4 oz    Types: 14 Cans of beer per week    Comment: BEER AND OTHER ALCOHOL DAILY --MAYBE 6 PACK BEER AND 1 PINT LIQUOR EVERY OTHER DAY--DENIES WITHDRWAL  . Drug use: No  . Sexual activity: Not on file  Other Topics Concern  . Not on file  Social History Narrative  . Not on file    Vital Signs: Blood pressure (!) 144/80, pulse 76, resp. rate 16, height 5\' 7"  (1.702 m), weight 191 lb 9.6 oz (86.9 kg), SpO2 97 %.  Examination: General Appearance: The patient is well-developed, well-nourished, and in no distress. Skin: Gross inspection of skin unremarkable. Head: normocephalic, no gross deformities. Eyes: no gross deformities noted. ENT: ears appear grossly normal no exudates. Neck: Supple. No thyromegaly. No LAD. Respiratory: No rhonchi noted. Cardiovascular: Normal S1 and S2 without murmur or  rub. Extremities: No cyanosis. pulses are equal. Neurologic: Alert and oriented. No involuntary movements.  LABS: No results found for this or any previous visit (from the past 2160 hour(s)).  Radiology: Ir Radiologist Eval & Mgmt  Result Date: 02/07/2017 Please refer to notes tab for details about interventional procedure. (Op Note)   No results found.  No results found.    Assessment and Plan: Patient Active Problem List   Diagnosis Date Noted  . CVA (cerebral vascular accident) ([place]) 11/01/2017  . HOH (hard of hearing) 11/01/2017  . HTN (hypertension) 11/01/2017  . Hyperlipidemia, unspecified 11/01/2017  . Hx of adenomatous polyp of colon 12/16/2016  . Squamous cell lung cancer ([place])   . Lung cancer, 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[place]) 05/07/2012  1. Squamous cell Cancer of LUL patient has been in remission since the last evaluation.  Will continue to follow with his oncologist.  He will have follow-up CT scan done as already indicated.  He is currently not smoking and is doing well overall 2. COPD seems to be controlled continue with present medications.  He is going to follow up with his PCP at the this 3. CVA stable at this time no further symptoms are noted.  General Counseling: I have discussed the findings of the evaluation and examination with Jeneen Rinks.  I have also discussed any further diagnostic evaluation thatmay be needed or ordered today. [place] verbalizes understanding of the findings of todays visit. We also reviewed his medications today and discussed drug interactions and side effects including but not limited excessive drowsiness and altered mental states. We also discussed that there is always a risk not just to him but also people around him. he has been encouraged to call the office with any questions or concerns that should arise related to todays visit.    Time spent: 21min  I have personally obtained a history, examined the patient, evaluated laboratory and imaging  results, formulated the assessment and plan and placed orders.    Allyne Gee, MD [facility] Pulmonary and Critical Care Sleep medicine

## 2017-11-23 ENCOUNTER — Other Ambulatory Visit: Payer: Self-pay

## 2017-11-23 MED ORDER — PHENYTOIN SODIUM EXTENDED 100 MG PO CAPS: 400.0000 mg | ORAL_CAPSULE | Freq: Every day | ORAL | 2 refills | Status: DC

## 2017-11-25 ENCOUNTER — Encounter: Payer: Self-pay | Admitting: Nurse Practitioner

## 2017-11-25 ENCOUNTER — Ambulatory Visit: Payer: Medicaid Other | Admitting: Nurse Practitioner

## 2017-11-25 VITALS — BP 147/78 | HR 97 | Resp 16 | Ht 67.0 in | Wt 191.8 lb

## 2017-11-25 DIAGNOSIS — I700 Atherosclerosis of aorta: Secondary | ICD-10-CM | POA: Diagnosis not present

## 2017-11-25 DIAGNOSIS — R300 Dysuria: Secondary | ICD-10-CM | POA: Diagnosis not present

## 2017-11-25 DIAGNOSIS — Z86010 Personal history of colonic polyps: Secondary | ICD-10-CM

## 2017-11-25 DIAGNOSIS — I10 Essential (primary) hypertension: Secondary | ICD-10-CM | POA: Diagnosis not present

## 2017-11-25 DIAGNOSIS — Z0001 Encounter for general adult medical examination with abnormal findings: Secondary | ICD-10-CM

## 2017-11-25 NOTE — Progress Notes (Signed)
[facility] [place], [place] [address]  Internal MEDICINE  Office Visit Note  Patient Name: Derek Webb  [id number]  [id number]  Date of Service: 12/18/2017   Pt is here for routine health maintenance examination  Chief Complaint  Patient presents with  . Annual Exam  . Hypertension     Hypertension  This is a chronic problem. The current episode started more than 1 year ago. The problem is unchanged. Associated symptoms include headaches. Pertinent negatives include no chest pain, neck pain, palpitations or shortness of breath. There are no associated agents to hypertension. Risk factors for coronary artery disease include dyslipidemia and male gender. Past treatments include ACE inhibitors. There are no compliance problems.  Hypertensive end-organ damage includes CAD/MI and CVA. Identifiable causes of hypertension include sleep apnea.     Current Medication: Outpatient Encounter Medications as of 11/25/2017  Medication Sig  . aspirin 325 MG tablet Take 81 mg by mouth daily. Reported on 01/21/2016  . aspirin 81 MG tablet Take 81 mg by mouth daily.  . budesonide-formoterol (SYMBICORT) 160-4.5 MCG/ACT inhaler Inhale 1 puff into the lungs 2 (two) times daily.  . Cholecalciferol (VITAMIN D) 2000 UNITS CAPS Take by mouth. VITAMIN D 3 2000 IU  ONE SOFTGEL DAILY  --OTC  . clopidogrel (PLAVIX) 75 MG tablet Take 75 mg by mouth daily.  . enalapril (VASOTEC) 10 MG tablet Take 10 mg by mouth daily.  Marland Kitchen ezetimibe (ZETIA) 10 MG tablet Take 10 mg by mouth daily.  . ferrous sulfate 325 (65 FE) MG tablet Take 325 mg by mouth daily with breakfast. Reported on 01/21/2016  . pantoprazole (PROTONIX) 40 MG tablet Take 40 mg by mouth daily. IN AM  . phenytoin (DILANTIN) 100 MG ER capsule Take 4 capsules (400 mg total) by mouth at bedtime.  . quinapril (ACCUPRIL) 10 MG tablet Take 10 mg by mouth at bedtime.  . [DISCONTINUED] atorvastatin (LIPITOR) 40 MG tablet Take 80 mg by  mouth at bedtime.   . [DISCONTINUED] atorvastatin (LIPITOR) 80 MG tablet Take 80 mg by mouth daily.   No facility-administered encounter medications on file as of 11/25/2017.     Surgical History: Past Surgical History:  Procedure Laterality Date  . CAROTID SURGERY 2009 -LEFT    . COLON POLYP EXCISION    . COLON SURGERY    . COLONOSCOPY WITH PROPOFOL N/A 04/18/2017   Procedure: COLONOSCOPY WITH PROPOFOL;  Surgeon: Manya Silvas, MD;  Location: [facility] ENDOSCOPY;  Service: Endoscopy;  Laterality: N/A;  . ESOPHAGOGASTRODUODENOSCOPY    . IR GENERIC HISTORICAL  01/21/2016   IR RADIOLOGIST EVAL & MGMT 01/21/2016 Aletta Edouard, MD [facility] INTERV RAD  . IR GENERIC HISTORICAL  10/22/2014   IR RADIOLOGIST EVAL & MGMT 10/22/2014 Aletta Edouard, MD [facility] INTERV RAD  . IR RADIOLOGIST EVAL & MGMT  02/02/2017  . percutaneous biopsy      Medical History: Past Medical History:  Diagnosis Date  . Alcoholism ([place])   . COPD (chronic obstructive pulmonary disease) ([facility])    SEVERE COPD -OCCASIONALLY USES OXYGEN AT NIGHT-DOES NOT USE INHALERS ON REGULAR BASIS  . Elevated cholesterol   . GERD (gastroesophageal reflux disease)   . HOH (hard of hearing)   . Hypertension   . Lung cancer ([place])    LEFT UPPER LUNG CARCINOMA-NOT A CANDIDATE FOR SURGICAL RESECTION BECAUSE OF HIS COPD AND POOR CANDIDATE FOR RADIATION BECAUSE OF TRANSPORTATION PROBLEMS  . Seizures ([place])    CHRONIC [place] NO  SEIZURES IN PAST COUPLE OF YEARS; PT STATES HIS SEIZURES CAUSED NUMBNESS OF ARMS AND HANDS AND NOT ABLE TO MOVE HIS ARMS  . Stroke ([place])    2 TO 3 YRS AGO-AFFECTED HIS SPEECH--ABLE TO AMBULATE WITHOUT ASSIST AND DOES YARD WORK.  DECREASED HEARING IN BOTH EARS SINCE STROKE.  Marland Kitchen Vitamin D deficiency     Family History: Family History  Problem Relation Age of Onset  . Colon polyps Sister       Review of Systems  Constitutional: Negative for activity change, chills, fatigue and unexpected weight change.  HENT:  Negative for congestion, postnasal drip, rhinorrhea, sneezing, sore throat and voice change.   Eyes: Negative for redness.  Respiratory: Negative for cough, chest tightness, shortness of breath and wheezing.   Cardiovascular: Negative for chest pain and palpitations.       Elevated blood pressure today.  Gastrointestinal: Negative for abdominal pain, constipation, diarrhea, nausea and vomiting.  Endocrine: Negative for cold intolerance, heat intolerance, polydipsia, polyphagia and polyuria.  Genitourinary: Negative for dysuria, frequency, penile pain, penile swelling, scrotal swelling, testicular pain and urgency.  Musculoskeletal: Negative for arthralgias, back pain, joint swelling and neck pain.  Skin: Negative for rash.  Allergic/Immunologic: Negative for environmental allergies.  Neurological: Positive for headaches. Negative for tremors and numbness.  Hematological: Negative for adenopathy. Does not bruise/bleed easily.  Psychiatric/Behavioral: Negative for behavioral problems (Depression), sleep disturbance and suicidal ideas. The patient is not nervous/anxious.      Vital Signs: BP (!) 147/78 (BP Location: Right Arm, Patient Position: Sitting, Cuff Size: Normal)   Pulse 97   Resp 16   Ht 5\' 7"  (1.702 m)   Wt 191 lb 12.8 oz (87 kg)   SpO2 98%   BMI 30.04 kg/m    Physical Exam  Constitutional: He is oriented to person, place, and time. He appears well-developed and well-nourished. No distress.  HENT:  Head: Normocephalic and atraumatic.  Mouth/Throat: Oropharynx is clear and moist. No oropharyngeal exudate.  Eyes: Pupils are equal, round, and reactive to light. EOM are normal.  Neck: Normal range of motion. Neck supple. No JVD present. Carotid bruit is not present. No tracheal deviation present. No thyromegaly present.  Cardiovascular: Normal rate, regular rhythm, normal heart sounds and intact distal pulses. Exam reveals no gallop and no friction rub.  No murmur  heard. Pulmonary/Chest: Effort normal and breath sounds normal. No respiratory distress. He has no wheezes. He has no rales. He exhibits no tenderness.  Abdominal: Soft. Bowel sounds are normal. There is no tenderness.  Musculoskeletal: Normal range of motion.  Lymphadenopathy:    He has no cervical adenopathy.  Neurological: He is alert and oriented to person, place, and time. He has normal reflexes. No cranial nerve deficit.  Patient is at his neurollgical baseline.   Skin: Skin is warm and dry. He is not diaphoretic.  Psychiatric: He has a normal mood and affect. His behavior is normal. Judgment and thought content normal.  Nursing note and vitals reviewed.    LABS: Recent Results (from the past 2160 hour(s))  Urinalysis, Routine w reflex microscopic     Status: Abnormal   Collection Time: 11/25/17  4:01 PM  Result Value Ref Range   Specific Gravity, UA      <=1.005 (A) 1.005 - 1.030   pH, UA 5.5 5.0 - 7.5   Color, UA Yellow Yellow   Appearance Ur Clear Clear   Leukocytes, UA Negative Negative   Protein, UA Negative Negative/Trace   Glucose,  UA Negative Negative   Ketones, UA Negative Negative   RBC, UA Negative Negative   Bilirubin, UA Negative Negative   Urobilinogen, Ur 0.2 0.2 - 1.0 mg/dL   Nitrite, UA Negative Negative   Microscopic Examination Comment     Comment: Microscopic not indicated and not performed.    Assessment/Plan: 1. Encounter for general adult medical examination with abnormal findings Annual wellness visit today  2. Atherosclerosis of aorta ([place]) Will get ultrasound of abdominal aorta for further evaluation. Refer to vein and vascular as indicated.  - VAS US AORTA/IVC/ILIACS; Future   3. Essential hypertension Generally stable. Continue bp medication as prescribed.   5. Dysuria - Urinalysis, Routine w reflex microscopic  6. Hx of adenomatous polyp of colon Continue regular visits with oncology/GI as scheduled.   General Counseling: jayel inks understanding of the findings of todays visit and agrees with plan of treatment. I have discussed any further diagnostic evaluation that may be needed or ordered today. We also reviewed his medications today. he has been encouraged to call the office with any questions or concerns that should arise related to todays visit.  This patient was seen by Leretha Pol, FNP- C in Collaboration with Dr Lavera Guise as a part of collaborative care agreement    Orders Placed This Encounter  Procedures  . Urinalysis, Routine w reflex microscopic      Time spent: [place], MD  Internal Medicine

## 2017-11-26 LAB — URINALYSIS, ROUTINE W REFLEX MICROSCOPIC
Bilirubin, UA: NEGATIVE
GLUCOSE, UA: NEGATIVE
KETONES UA: NEGATIVE
Leukocytes, UA: NEGATIVE
Nitrite, UA: NEGATIVE
PROTEIN UA: NEGATIVE
RBC, UA: NEGATIVE
Urobilinogen, Ur: 0.2 mg/dL (ref 0.2–1.0)
pH, UA: 5.5 (ref 5.0–7.5)

## 2017-12-15 ENCOUNTER — Other Ambulatory Visit: Payer: Self-pay

## 2017-12-15 MED ORDER — ATORVASTATIN CALCIUM 80 MG PO TABS: 80.0000 mg | ORAL_TABLET | Freq: Every day | ORAL | 5 refills | Status: DC

## 2017-12-18 DIAGNOSIS — I700 Atherosclerosis of aorta: Secondary | ICD-10-CM | POA: Insufficient documentation

## 2017-12-18 DIAGNOSIS — Z0001 Encounter for general adult medical examination with abnormal findings: Secondary | ICD-10-CM | POA: Insufficient documentation

## 2017-12-18 DIAGNOSIS — R300 Dysuria: Secondary | ICD-10-CM | POA: Insufficient documentation

## 2017-12-23 ENCOUNTER — Other Ambulatory Visit: Payer: Self-pay

## 2017-12-23 MED ORDER — ENALAPRIL MALEATE 20 MG PO TABS: 20.0000 mg | ORAL_TABLET | Freq: Every day | ORAL | 3 refills | Status: DC

## 2017-12-23 MED ORDER — PANTOPRAZOLE SODIUM 40 MG PO TBEC: 40.0000 mg | DELAYED_RELEASE_TABLET | Freq: Every day | ORAL | 3 refills | Status: DC

## 2017-12-23 MED ORDER — CLOPIDOGREL BISULFATE 75 MG PO TABS: 75.0000 mg | ORAL_TABLET | Freq: Every day | ORAL | 3 refills | Status: DC

## 2017-12-26 ENCOUNTER — Other Ambulatory Visit: Payer: Self-pay

## 2017-12-26 MED ORDER — EZETIMIBE 10 MG PO TABS: 10.0000 mg | ORAL_TABLET | Freq: Every day | ORAL | 6 refills | Status: DC

## 2018-02-01 ENCOUNTER — Other Ambulatory Visit (HOSPITAL_COMMUNITY): Payer: Self-pay | Admitting: Interventional Radiology

## 2018-02-02 ENCOUNTER — Ambulatory Visit: Payer: Medicaid Other | Admitting: Nurse Practitioner

## 2018-02-02 ENCOUNTER — Encounter: Payer: Self-pay | Admitting: Nurse Practitioner

## 2018-02-02 VITALS — BP 140/80 | HR 61 | Resp 16 | Ht 67.0 in | Wt 188.0 lb

## 2018-02-02 DIAGNOSIS — I10 Essential (primary) hypertension: Secondary | ICD-10-CM

## 2018-02-02 DIAGNOSIS — I700 Atherosclerosis of aorta: Secondary | ICD-10-CM

## 2018-02-02 DIAGNOSIS — K219 Gastro-esophageal reflux disease without esophagitis: Secondary | ICD-10-CM

## 2018-02-02 DIAGNOSIS — J449 Chronic obstructive pulmonary disease, unspecified: Secondary | ICD-10-CM | POA: Diagnosis not present

## 2018-02-02 NOTE — Progress Notes (Signed)
[facility] [place], [place] [address]  Internal MEDICINE  Office Visit Note  Patient Name: Derek Webb  [id number]  [id number]  Date of Service: 02/08/2018     Chief Complaint  Patient presents with  . Hypertension    An ultrasound of abdominal aorta was ordered at his last visit. There was atherosclerosis found on chest CT doe per interventional radiology, but degree of atherosclerosis was not identified.  Hypertension  This is a chronic problem. The current episode started more than 1 year ago. The problem is unchanged. The problem is controlled. Associated symptoms include headaches and malaise/fatigue. Pertinent negatives include no chest pain, neck pain, palpitations or shortness of breath. There are no associated agents to hypertension. Risk factors for coronary artery disease include dyslipidemia, male gender, smoking/tobacco exposure and obesity. Past treatments include ACE inhibitors. The current treatment provides moderate improvement. Compliance problems include psychosocial issues.  Hypertensive end-organ damage includes CAD/MI and CVA. Identifiable causes of hypertension include sleep apnea.       Current Medication: Outpatient Encounter Medications as of 02/02/2018  Medication Sig  . aspirin 325 MG tablet Take 81 mg by mouth daily. Reported on 01/21/2016  . aspirin 81 MG tablet Take 81 mg by mouth daily.  Marland Kitchen atorvastatin (LIPITOR) 80 MG tablet Take 1 tablet (80 mg total) by mouth daily.  . budesonide-formoterol (SYMBICORT) 160-4.5 MCG/ACT inhaler Inhale 1 puff into the lungs 2 (two) times daily.  . Cholecalciferol (VITAMIN D) 2000 UNITS CAPS Take by mouth. VITAMIN D 3 2000 IU  ONE SOFTGEL DAILY  --OTC  . clopidogrel (PLAVIX) 75 MG tablet Take 1 tablet (75 mg total) by mouth daily.  . enalapril (VASOTEC) 20 MG tablet Take 1 tablet (20 mg total) by mouth daily.  Marland Kitchen ezetimibe (ZETIA) 10 MG tablet Take 1 tablet (10 mg total) by mouth daily.  .  ferrous sulfate 325 (65 FE) MG tablet Take 325 mg by mouth daily with breakfast. Reported on 01/21/2016  . pantoprazole (PROTONIX) 40 MG tablet Take 1 tablet (40 mg total) by mouth daily. IN AM  . phenytoin (DILANTIN) 100 MG ER capsule Take 4 capsules (400 mg total) by mouth at bedtime.  . quinapril (ACCUPRIL) 10 MG tablet Take 10 mg by mouth at bedtime.   No facility-administered encounter medications on file as of 02/02/2018.     Surgical History: Past Surgical History:  Procedure Laterality Date  . CAROTID SURGERY 2009 -LEFT    . COLON POLYP EXCISION    . COLON SURGERY    . COLONOSCOPY WITH PROPOFOL N/A 04/18/2017   Procedure: COLONOSCOPY WITH PROPOFOL;  Surgeon: Manya Silvas, MD;  Location: [facility] ENDOSCOPY;  Service: Endoscopy;  Laterality: N/A;  . ESOPHAGOGASTRODUODENOSCOPY    . IR GENERIC HISTORICAL  01/21/2016   IR RADIOLOGIST EVAL & MGMT 01/21/2016 Aletta Edouard, MD [facility] INTERV RAD  . IR GENERIC HISTORICAL  10/22/2014   IR RADIOLOGIST EVAL & MGMT 10/22/2014 Aletta Edouard, MD [facility] INTERV RAD  . IR RADIOLOGIST EVAL & MGMT  02/02/2017  . percutaneous biopsy      Medical History: Past Medical History:  Diagnosis Date  . Alcoholism ([place])   . COPD (chronic obstructive pulmonary disease) (HCC)    SEVERE COPD -OCCASIONALLY USES OXYGEN AT NIGHT-DOES NOT USE INHALERS ON REGULAR BASIS  . Elevated cholesterol   . GERD (gastroesophageal reflux disease)   . HOH (hard of hearing)   . Hypertension   . Lung cancer ([place])    LEFT UPPER  LUNG CARCINOMA-NOT A CANDIDATE FOR SURGICAL RESECTION BECAUSE OF HIS COPD AND POOR CANDIDATE FOR RADIATION BECAUSE OF TRANSPORTATION PROBLEMS  . Seizures ([place])    CHRONIC DILATIN - PT STATES NO SEIZURES IN PAST COUPLE OF YEARS; PT STATES HIS SEIZURES CAUSED NUMBNESS OF ARMS AND HANDS AND NOT ABLE TO MOVE HIS ARMS  . Stroke ([place])    2 TO 3 YRS AGO-AFFECTED HIS SPEECH--ABLE TO AMBULATE WITHOUT ASSIST AND DOES YARD WORK.  DECREASED HEARING IN BOTH EARS SINCE  STROKE.  Marland Kitchen Vitamin D deficiency     Family History: Family History  Problem Relation Age of Onset  . Colon polyps Sister     Social History   Socioeconomic History  . Marital status: Single    Spouse name: Not on file  . Number of children: Not on file  . Years of education: Not on file  . Highest education level: Not on file  Occupational History  . Not on file  Social Needs  . Financial resource strain: Not on file  . Food insecurity:    Worry: Not on file    Inability: Not on file  . Transportation needs:    Medical: Not on file    Non-medical: Not on file  Tobacco Use  . Smoking status: Former Smoker    Packs/day: 1.00    Years: 35.00    Pack years: 35.00    Types: Cigarettes    Last attempt to quit: 12/22/2011    Years since quitting: 6.1  . Smokeless tobacco: Never Used  Substance and Sexual Activity  . Alcohol use: Yes    Alcohol/week: 8.4 oz    Types: 14 Cans of beer per week    Comment: BEER AND OTHER ALCOHOL DAILY --MAYBE 6 PACK BEER AND 1 PINT LIQUOR EVERY OTHER DAY--DENIES WITHDRWAL  . Drug use: No  . Sexual activity: Not on file  Lifestyle  . Physical activity:    Days per week: Not on file    Minutes per session: Not on file  . Stress: Not on file  Relationships  . Social connections:    Talks on phone: Not on file    Gets together: Not on file    Attends religious service: Not on file    Active member of club or organization: Not on file    Attends meetings of clubs or organizations: Not on file    Relationship status: Not on file  . Intimate partner violence:    Fear of current or ex partner: Not on file    Emotionally abused: Not on file    Physically abused: Not on file    Forced sexual activity: Not on file  Other Topics Concern  . Not on file  Social History Narrative  . Not on file      Review of Systems  Constitutional: Positive for malaise/fatigue. Negative for activity change, chills, fatigue and unexpected weight change.   HENT: Negative for congestion, postnasal drip, rhinorrhea, sneezing, sore throat and voice change.   Eyes: Negative.  Negative for redness.  Respiratory: Negative for cough, chest tightness, shortness of breath and wheezing.   Cardiovascular: Negative for chest pain and palpitations.       Blood pressure improved.   Gastrointestinal: Positive for abdominal distention. Negative for abdominal pain, constipation, diarrhea, nausea and vomiting.  Endocrine: Negative for cold intolerance, heat intolerance, polydipsia, polyphagia and polyuria.  Genitourinary: Negative for dysuria, frequency, penile pain, penile swelling, scrotal swelling, testicular pain and urgency.  Musculoskeletal: Negative  for arthralgias, back pain, joint swelling, myalgias and neck pain.  Skin: Negative for rash.  Allergic/Immunologic: Negative for environmental allergies.  Neurological: Positive for headaches. Negative for tremors and numbness.  Hematological: Negative for adenopathy. Does not bruise/bleed easily.  Psychiatric/Behavioral: Negative for behavioral problems (Depression), sleep disturbance and suicidal ideas. The patient is not nervous/anxious.     Today's Vitals   02/02/18 1438  BP: 140/80  Pulse: 61  Resp: 16  SpO2: 98%  Weight: 188 lb (85.3 kg)  Height: 5\' 7"  (1.702 m)    Physical Exam  Constitutional: He is oriented to person, place, and time. He appears well-developed and well-nourished. No distress.  HENT:  Head: Normocephalic and atraumatic.  Nose: Nose normal.  Mouth/Throat: Oropharynx is clear and moist. No oropharyngeal exudate.  Eyes: Pupils are equal, round, and reactive to light. Conjunctivae and EOM are normal.  Neck: Normal range of motion. Neck supple. No JVD present. Carotid bruit is not present. No tracheal deviation present. No thyromegaly present.  Cardiovascular: Normal rate, regular rhythm and normal heart sounds. Exam reveals no gallop and no friction rub.  No murmur  heard. Pulmonary/Chest: Effort normal and breath sounds normal. No respiratory distress. He has no wheezes. He has no rales. He exhibits no tenderness.  Some congested breath sounds which clear with cough.   Abdominal: Soft. Bowel sounds are normal. There is no tenderness.  Musculoskeletal: Normal range of motion.  Lymphadenopathy:    He has no cervical adenopathy.  Neurological: He is alert and oriented to person, place, and time. He has normal reflexes. No cranial nerve deficit.  Patient is at his neurollgical baseline.   Skin: Skin is warm and dry. He is not diaphoretic.  Psychiatric: He has a normal mood and affect. His behavior is normal. Judgment and thought content normal.  Nursing note and vitals reviewed.  Assessment/Plan: 1. Essential hypertension Generally well managed. Continue bp medication as prescribed.   2. Atherosclerosis of aorta ([place]) Initially noted on chest CT done per oncology. Will get ultrasound of abdominal arteries for further evaluation.   3. Obstructive chronic bronchitis without exacerbation ([place]) diong well. Continue symbicort twice daily.   4. Gastroesophageal reflux disease without esophagitis Continue PPI as prescribed.  General Counseling: Derek Webb understanding of the findings of todays visit and agrees with plan of treatment. I have discussed any further diagnostic evaluation that may be needed or ordered today. We also reviewed his medications today. he has been encouraged to call the office with any questions or concerns that should arise related to todays visit.    Counseling:  Hypertension Counseling:   The following hypertensive lifestyle modification were recommended and discussed:  1. Limiting alcohol intake to less than 1 oz/day of ethanol:(24 oz of beer or 8 oz of wine or 2 oz of 100-proof whiskey). 2. Take baby ASA 81 mg daily. 3. Importance of regular aerobic exercise and losing weight. 4. Reduce dietary saturated fat and  cholesterol intake for overall cardiovascular health. 5. Maintaining adequate dietary potassium, calcium, and magnesium intake. 6. Regular monitoring of the blood pressure. 7. Reduce sodium intake to less than 100 mmol/day (less than 2.3 gm of sodium or less than 6 gm of sodium choride)   This patient was seen by Leretha Pol, FNP- C in Collaboration with Dr Lavera Guise as a part of collaborative care agreement   Time spent: 75 Minutes     Dr Lavera Guise Internal medicine

## 2018-02-06 ENCOUNTER — Other Ambulatory Visit (HOSPITAL_COMMUNITY): Payer: Self-pay | Admitting: Interventional Radiology

## 2018-02-06 DIAGNOSIS — C3492 Malignant neoplasm of unspecified part of left bronchus or lung: Secondary | ICD-10-CM

## 2018-02-08 DIAGNOSIS — J449 Chronic obstructive pulmonary disease, unspecified: Secondary | ICD-10-CM | POA: Insufficient documentation

## 2018-02-08 DIAGNOSIS — K219 Gastro-esophageal reflux disease without esophagitis: Secondary | ICD-10-CM | POA: Insufficient documentation

## 2018-02-08 DIAGNOSIS — J4489 Other specified chronic obstructive pulmonary disease: Secondary | ICD-10-CM | POA: Insufficient documentation

## 2018-02-24 ENCOUNTER — Other Ambulatory Visit: Payer: Self-pay

## 2018-02-24 MED ORDER — BUDESONIDE-FORMOTEROL FUMARATE 160-4.5 MCG/ACT IN AERO: 1.0000 | INHALATION_SPRAY | Freq: Two times a day (BID) | RESPIRATORY_TRACT | 3 refills | Status: DC

## 2018-02-27 ENCOUNTER — Other Ambulatory Visit: Payer: Self-pay

## 2018-02-27 MED ORDER — PHENYTOIN SODIUM EXTENDED 100 MG PO CAPS: 400.0000 mg | ORAL_CAPSULE | Freq: Every day | ORAL | 2 refills | Status: DC

## 2018-03-02 ENCOUNTER — Ambulatory Visit
Admission: RE | Admit: 2018-03-02 | Discharge: 2018-03-02 | Disposition: A | Discharge status: Home / Self Care | Payer: Medicaid Other | Source: Ambulatory Visit | Attending: Interventional Radiology | Admitting: Interventional Radiology

## 2018-03-02 DIAGNOSIS — I700 Atherosclerosis of aorta: Secondary | ICD-10-CM | POA: Insufficient documentation

## 2018-03-02 DIAGNOSIS — E278 Other specified disorders of adrenal gland: Secondary | ICD-10-CM | POA: Diagnosis not present

## 2018-03-02 DIAGNOSIS — C3492 Malignant neoplasm of unspecified part of left bronchus or lung: Secondary | ICD-10-CM | POA: Diagnosis present

## 2018-03-02 DIAGNOSIS — J439 Emphysema, unspecified: Secondary | ICD-10-CM | POA: Diagnosis not present

## 2018-03-02 LAB — POCT I-STAT CREATININE: Creatinine, Ser: 0.6 mg/dL — ABNORMAL LOW (ref 0.61–1.24)

## 2018-03-02 MED ORDER — IOHEXOL 300 MG/ML  SOLN
75.0000 mL | Freq: Once | INTRAMUSCULAR | Status: AC | PRN
  Administered 2018-03-02: 75 mL via INTRAVENOUS

## 2018-03-07 ENCOUNTER — Telehealth: Payer: Self-pay | Admitting: Nurse Practitioner

## 2018-03-07 NOTE — Telephone Encounter (Signed)
With it being medicaid im pretty sure it will need to be mentioned and ordered with an office visit

## 2018-03-07 NOTE — Telephone Encounter (Signed)
His next visit isn't until 07/2018. Can we go ahead and get him set up for echo? He just had chest CT done with contrast. We have results. Will that help him get approval?

## 2018-03-08 ENCOUNTER — Encounter: Payer: Self-pay | Admitting: Radiology

## 2018-03-08 ENCOUNTER — Ambulatory Visit
Admission: RE | Admit: 2018-03-08 | Discharge: 2018-03-08 | Disposition: A | Discharge status: Home / Self Care | Payer: Medicaid Other | Source: Ambulatory Visit | Attending: Interventional Radiology | Admitting: Interventional Radiology

## 2018-03-08 DIAGNOSIS — C3492 Malignant neoplasm of unspecified part of left bronchus or lung: Secondary | ICD-10-CM

## 2018-03-08 HISTORY — PX: IR RADIOLOGIST EVAL & MGMT: IMG5224

## 2018-03-08 NOTE — Progress Notes (Signed)
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  History of Present Illness: Derek Webb is a 67 y.o. male who returns for follow-up after thermal ablation of a left upper lobe squamous lung carcinoma.  He has been stable and doing well.  He does state that he still occasionally smokes cigarettes.  He is asymptomatic.  Past Medical History:  Diagnosis Date  . Alcoholism ([place])   . COPD (chronic obstructive pulmonary disease) (HCC)    SEVERE COPD -OCCASIONALLY USES OXYGEN AT NIGHT-DOES NOT USE INHALERS ON REGULAR BASIS  . Elevated cholesterol   . GERD (gastroesophageal reflux disease)   . HOH (hard of hearing)   . Hypertension   . Lung cancer ([place])    LEFT UPPER LUNG CARCINOMA-NOT A CANDIDATE FOR SURGICAL RESECTION BECAUSE OF HIS COPD AND POOR CANDIDATE FOR RADIATION BECAUSE OF TRANSPORTATION PROBLEMS  . Seizures ([place])    CHRONIC DILATIN - PT STATES NO SEIZURES IN PAST COUPLE OF YEARS; PT STATES HIS SEIZURES CAUSED NUMBNESS OF ARMS AND HANDS AND NOT ABLE TO MOVE HIS ARMS  . Stroke ([place])    2 TO 3 YRS AGO-AFFECTED HIS SPEECH--ABLE TO AMBULATE WITHOUT ASSIST AND DOES YARD WORK.  DECREASED HEARING IN BOTH EARS SINCE STROKE.  Marland Kitchen Vitamin D deficiency     Past Surgical History:  Procedure Laterality Date  . CAROTID SURGERY 2009 -LEFT    . COLON POLYP EXCISION    . COLON SURGERY    . COLONOSCOPY WITH PROPOFOL N/A 04/18/2017   Procedure: COLONOSCOPY WITH PROPOFOL;  Surgeon: Manya Silvas, MD;  Location: [facility] ENDOSCOPY;  Service: Endoscopy;  Laterality: N/A;  . ESOPHAGOGASTRODUODENOSCOPY    . IR GENERIC HISTORICAL  01/21/2016   IR RADIOLOGIST EVAL & MGMT 01/21/2016 Aletta Edouard, MD [facility] INTERV RAD  . IR GENERIC HISTORICAL  10/22/2014   IR RADIOLOGIST EVAL & MGMT 10/22/2014 Aletta Edouard, MD [facility] INTERV RAD  . IR RADIOLOGIST EVAL & MGMT  02/02/2017  . IR RADIOLOGIST EVAL & MGMT  03/08/2018  . percutaneous biopsy       Allergies: Patient has no known allergies.  Medications: Prior to Admission medications   Medication Sig Start Date End Date Taking? Authorizing Provider  aspirin 325 MG tablet Take 81 mg by mouth daily. Reported on 01/21/2016   Yes [provider]  aspirin 81 MG tablet Take 81 mg by mouth daily.   Yes [provider]  atorvastatin (LIPITOR) 80 MG tablet Take 1 tablet (80 mg total) by mouth daily. 12/15/17  Yes Boscia, Greer Ee, NP  budesonide-formoterol (SYMBICORT) 160-4.5 MCG/ACT inhaler Inhale 1 puff into the lungs 2 (two) times daily. 02/24/18  Yes Ronnell Freshwater, NP  Cholecalciferol (VITAMIN D) 2000 UNITS CAPS Take by mouth. VITAMIN D 3 2000 IU  ONE SOFTGEL DAILY  --OTC   Yes [provider]  clopidogrel (PLAVIX) 75 MG tablet Take 1 tablet (75 mg total) by mouth daily. 12/23/17  Yes Boscia, Heather E, NP  enalapril (VASOTEC) 20 MG tablet Take 1 tablet (20 mg total) by mouth daily. 12/23/17  Yes Boscia, Greer Ee, NP  ezetimibe (ZETIA) 10 MG tablet Take 1 tablet (10 mg total) by mouth daily. 12/26/17  Yes Boscia, Heather E, NP  pantoprazole (PROTONIX) 40 MG tablet Take 1 tablet (40 mg total) by mouth daily. IN AM 12/23/17  Yes Boscia, Greer Ee, NP  phenytoin (DILANTIN) 100 MG ER capsule Take 4 capsules (400 mg total) by  mouth at bedtime. 02/27/18  Yes Boscia, Heather E, NP  quinapril (ACCUPRIL) 10 MG tablet Take 10 mg by mouth at bedtime.   Yes [provider]  ferrous sulfate 325 (65 FE) MG tablet Take 325 mg by mouth daily with breakfast. Reported on 01/21/2016    [provider]     Family History  Problem Relation Age of Onset  . Colon polyps Sister     Social History   Socioeconomic History  . Marital status: Single    Spouse name: Not on file  . Number of children: Not on file  . Years of education: Not on file  . Highest education level: Not on file  Occupational History  . Not on file  Social Needs  . Financial resource strain:  Not on file  . Food insecurity:    Worry: Not on file    Inability: Not on file  . Transportation needs:    Medical: Not on file    Non-medical: Not on file  Tobacco Use  . Smoking status: Former Smoker    Packs/day: 1.00    Years: 35.00    Pack years: 35.00    Types: Cigarettes    Last attempt to quit: 12/22/2011    Years since quitting: 6.2  . Smokeless tobacco: Never Used  Substance and Sexual Activity  . Alcohol use: Yes    Alcohol/week: 8.4 oz    Types: 14 Cans of beer per week    Comment: BEER AND OTHER ALCOHOL DAILY --MAYBE 6 PACK BEER AND 1 PINT LIQUOR EVERY OTHER DAY--DENIES WITHDRWAL  . Drug use: No  . Sexual activity: Not on file  Lifestyle  . Physical activity:    Days per week: Not on file    Minutes per session: Not on file  . Stress: Not on file  Relationships  . Social connections:    Talks on phone: Not on file    Gets together: Not on file    Attends religious service: Not on file    Active member of club or organization: Not on file    Attends meetings of clubs or organizations: Not on file    Relationship status: Not on file  Other Topics Concern  . Not on file  Social History Narrative  . Not on file    ECOG Status: 0 - Asymptomatic  Review of Systems: A 12 point ROS discussed and pertinent positives are indicated in the HPI above.  All other systems are negative.  Review of Systems  Constitutional: Negative.   HENT: Negative.   Respiratory: Negative.   Cardiovascular: Negative.   Gastrointestinal: Negative.   Genitourinary: Negative.   Musculoskeletal: Negative.   Neurological: Negative.     Vital Signs: BP 139/83   Pulse 78   Temp 97.7 F (36.5 C) (Oral)   Resp 14   Ht 5\' 7"  (1.702 m)   Wt 180 lb (81.6 kg)   SpO2 97%   BMI 28.19 kg/m   Physical Exam  Constitutional: He is oriented to person, place, and time. No distress.  Cardiovascular: Normal rate, regular rhythm and normal heart sounds. Exam reveals no gallop and no  friction rub.  No murmur heard. Pulmonary/Chest: Effort normal and breath sounds normal. No stridor. No respiratory distress. He has no wheezes. He has no rales.  Abdominal: Soft. He exhibits no distension. There is no tenderness.  Musculoskeletal: He exhibits no edema.  Neurological: He is alert and oriented to person, place, and time.  Skin:  He is not diaphoretic.  Vitals reviewed.   Imaging: Ct Chest W Contrast  Result Date: 03/02/2018 CLINICAL DATA:  Follow-up of left upper lobe lung cancer, post thermal ablation in 2013. Patient denies any symptoms. EXAM: CT CHEST WITH CONTRAST TECHNIQUE: Multidetector CT imaging of the chest was performed during intravenous contrast administration. CONTRAST:  64mL OMNIPAQUE IOHEXOL 300 MG/ML  SOLN COMPARISON:  01/26/2017, 01/14/2016 FINDINGS: Cardiovascular: Stable fusiform dilation of the ascending aorta measuring 4.0 cm. Calcific atherosclerotic disease of the aorta and coronary arteries. Normal heart size. No pericardial effusion. Mediastinum/Nodes: No enlarged mediastinal, hilar, or axillary lymph nodes. Thyroid gland, trachea, and esophagus demonstrate no significant findings. Subcentimeter soft tissue nodule in the right anterolateral chest wall, stable. Lungs/Pleura: Stable spiculated soft tissue and ground-glass scar like mass in the left upper lobe measures 3.3 x 1.2 cm, stable when considering differences in measurement techniques. Persistent right middle lobe atelectasis versus scarring. Mild upper lobe predominant emphysema. Upper Abdomen: Stable bilateral adrenal gland thickening and left adrenal gland nodule measuring 2.1 cm, considered benign in correlation to PET-CT dated 2013. Calcified hepatic duodenal ligament lymph node, stable. Musculoskeletal: No chest wall abnormality. No acute or significant osseous findings. IMPRESSION: Stable posttreatment changes in the left upper lobe, without convincing evidence of recurrence. Persistent right middle  lobe atelectasis. Stable bilateral adrenal gland thickening and 2.1 cm left adrenal gland nodule, considered benign in correlation to 2013 PET-CT. Aortic Atherosclerosis (ICD10-I70.0) and Emphysema (ICD10-J43.9). Electronically Signed   By: Fidela Salisbury M.D.   On: 03/02/2018 17:49   Ir Radiologist Eval & Mgmt  Result Date: 03/08/2018 Please refer to notes tab for details about interventional procedure. (Op Note)   Labs:  CBC: No results for input(s): WBC, HGB, HCT, PLT in the last 8760 hours.  COAGS: No results for input(s): INR, APTT in the last 8760 hours.  BMP: Recent Labs    03/02/18 1444  CREATININE 0.60*     Assessment and Plan:  A follow-up CT of the chest was performed on 03/02/2018.  This demonstrates stable scarring in the left upper lobe at the level of treated squamous carcinoma with no evidence of carcinoma recurrence.  No new lung lesions are identified.  There is no evidence of adenopathy.  The ascending thoracic aorta is top normal in size measuring approximately 4 cm in maximum diameter.  When going back to prior studies this is a stable finding since 2013 and the aorta clearly has not increased in size over the last 6 years.  I did not recommend any further routine imaging to follow the treated carcinoma given no evidence of recurrence for 6 years following ablation.  Periodic chest x-ray may be appropriate and I will leave that follow-up up to Dr. Humphrey Rolls.  It is debatable whether the ascending thoracic aorta needs to be followed by imaging given stability of diameter over 6 years.  A CTA of the thoracic aorta could be considered in 2 to 3 years for surveillance purposes.  I will no longer be following Mr. Ybanez regularly.  I did encourage him to try to completely stop smoking if he is still occasionally smoking.  I suggested he speak with Dr. Humphrey Rolls the next time he sees him about smoking cessation.   Electronically SignedAletta Edouard T 03/08/2018, 5:39 PM      I spent a total of 15 Minutes in face to face in clinical consultation, greater than 50% of which was counseling/coordinating care post ablation of a left upper lobe lung carcinoma.

## 2018-04-25 ENCOUNTER — Other Ambulatory Visit: Payer: Self-pay | Admitting: Nurse Practitioner

## 2018-04-25 MED ORDER — CLOPIDOGREL BISULFATE 75 MG PO TABS: 75.0000 mg | ORAL_TABLET | Freq: Every day | ORAL | 3 refills | Status: DC

## 2018-04-25 MED ORDER — ENALAPRIL MALEATE 20 MG PO TABS: 20.0000 mg | ORAL_TABLET | Freq: Every day | ORAL | 3 refills | Status: DC

## 2018-05-02 ENCOUNTER — Ambulatory Visit: Payer: Self-pay | Admitting: Internal Medicine

## 2018-05-09 ENCOUNTER — Encounter: Payer: Self-pay | Admitting: Internal Medicine

## 2018-05-09 ENCOUNTER — Ambulatory Visit: Payer: Medicaid Other | Admitting: Internal Medicine

## 2018-05-09 VITALS — BP 142/82 | HR 72 | Resp 16 | Ht 67.0 in | Wt 179.0 lb

## 2018-05-09 DIAGNOSIS — J449 Chronic obstructive pulmonary disease, unspecified: Secondary | ICD-10-CM | POA: Diagnosis not present

## 2018-05-09 DIAGNOSIS — K219 Gastro-esophageal reflux disease without esophagitis: Secondary | ICD-10-CM

## 2018-05-09 DIAGNOSIS — I10 Essential (primary) hypertension: Secondary | ICD-10-CM

## 2018-05-09 DIAGNOSIS — J4489 Other specified chronic obstructive pulmonary disease: Secondary | ICD-10-CM

## 2018-05-09 DIAGNOSIS — C3492 Malignant neoplasm of unspecified part of left bronchus or lung: Secondary | ICD-10-CM | POA: Diagnosis not present

## 2018-05-09 DIAGNOSIS — R0602 Shortness of breath: Secondary | ICD-10-CM | POA: Diagnosis not present

## 2018-05-09 NOTE — Progress Notes (Signed)
[facility] [place], [place] [address]  Pulmonary Sleep Medicine   Office Visit Note  Patient Name: Derek Webb DOB: 02-03-1951 MRN [id number]  Date of Service: 05/09/2018  Complaints/HPI: Pt here for follow up COPD.  He reports he has been doing well since his last visit.  He had a follow up CT scan performed in July due to a lung nodule that was previously found. He had thermal ablation done in 2013.   The Ct showed "Stable posttreatment changes in the left upper lobe, without convincing evidence of recurrence".  He has been using his inhaler as prescribed. He denies chest pain, cough, hemoptysis or recent hospitalization. Arlyce Harman today shows percent predicted FEV1 69%, which is improved from PFT's in July 2018.  It is time for repeat PFTs.   ROS  General: (-) fever, (-) chills, (-) night sweats, (-) weakness Skin: (-) rashes, (-) itching,. Eyes: (-) visual changes, (-) redness, (-) itching. Nose and Sinuses: (-) nasal stuffiness or itchiness, (-) postnasal drip, (-) nosebleeds, (-) sinus trouble. Mouth and Throat: (-) sore throat, (-) hoarseness. Neck: (-) swollen glands, (-) enlarged thyroid, (-) neck pain. Respiratory: - cough, (-) bloody sputum, - shortness of breath, - wheezing. Cardiovascular: - ankle swelling, (-) chest pain. Lymphatic: (-) lymph node enlargement. Neurologic: (-) numbness, (-) tingling. Psychiatric: (-) anxiety, (-) depression   Current Medication: Outpatient Encounter Medications as of 05/09/2018  Medication Sig  . aspirin 325 MG tablet Take 81 mg by mouth daily. Reported on 01/21/2016  . aspirin 81 MG tablet Take 81 mg by mouth daily.  Marland Kitchen atorvastatin (LIPITOR) 80 MG tablet Take 1 tablet (80 mg total) by mouth daily.  . budesonide-formoterol (SYMBICORT) 160-4.5 MCG/ACT inhaler Inhale 1 puff into the lungs 2 (two) times daily.  . Cholecalciferol (VITAMIN D) 2000 UNITS CAPS Take by mouth. VITAMIN D 3 2000 IU  ONE SOFTGEL DAILY   --OTC  . clopidogrel (PLAVIX) 75 MG tablet Take 1 tablet (75 mg total) by mouth daily.  . enalapril (VASOTEC) 20 MG tablet Take 1 tablet (20 mg total) by mouth daily.  Marland Kitchen ezetimibe (ZETIA) 10 MG tablet Take 1 tablet (10 mg total) by mouth daily.  . ferrous sulfate 325 (65 FE) MG tablet Take 325 mg by mouth daily with breakfast. Reported on 01/21/2016  . pantoprazole (PROTONIX) 40 MG tablet Take 1 tablet (40 mg total) by mouth daily. IN AM  . phenytoin (DILANTIN) 100 MG ER capsule Take 4 capsules (400 mg total) by mouth at bedtime.  . quinapril (ACCUPRIL) 10 MG tablet Take 10 mg by mouth at bedtime.   No facility-administered encounter medications on file as of 05/09/2018.     Surgical History: Past Surgical History:  Procedure Laterality Date  . CAROTID SURGERY 2009 -LEFT    . COLON POLYP EXCISION    . COLON SURGERY    . COLONOSCOPY WITH PROPOFOL N/A 04/18/2017   Procedure: COLONOSCOPY WITH PROPOFOL;  Surgeon: Manya Silvas, MD;  Location: [facility] ENDOSCOPY;  Service: Endoscopy;  Laterality: N/A;  . ESOPHAGOGASTRODUODENOSCOPY    . IR GENERIC HISTORICAL  01/21/2016   IR RADIOLOGIST EVAL & MGMT 01/21/2016 Aletta Edouard, MD [facility] INTERV RAD  . IR GENERIC HISTORICAL  10/22/2014   IR RADIOLOGIST EVAL & MGMT 10/22/2014 Aletta Edouard, MD [facility] INTERV RAD  . IR RADIOLOGIST EVAL & MGMT  02/02/2017  . IR RADIOLOGIST EVAL & MGMT  03/08/2018  . percutaneous biopsy      Medical History: Past Medical  History:  Diagnosis Date  . Alcoholism ([place])   . COPD (chronic obstructive pulmonary disease) (HCC)    SEVERE COPD -OCCASIONALLY USES OXYGEN AT NIGHT-DOES NOT USE INHALERS ON REGULAR BASIS  . Elevated cholesterol   . GERD (gastroesophageal reflux disease)   . HOH (hard of hearing)   . Hypertension   . Lung cancer ([place])    LEFT UPPER LUNG CARCINOMA-NOT A CANDIDATE FOR SURGICAL RESECTION BECAUSE OF HIS COPD AND POOR CANDIDATE FOR RADIATION BECAUSE OF TRANSPORTATION PROBLEMS  . Seizures ([place])     CHRONIC DILATIN - PT STATES NO SEIZURES IN PAST COUPLE OF YEARS; PT STATES HIS SEIZURES CAUSED NUMBNESS OF ARMS AND HANDS AND NOT ABLE TO MOVE HIS ARMS  . Stroke ([place])    2 TO 3 YRS AGO-AFFECTED HIS SPEECH--ABLE TO AMBULATE WITHOUT ASSIST AND DOES YARD WORK.  DECREASED HEARING IN BOTH EARS SINCE STROKE.  Marland Kitchen Vitamin D deficiency     Family History: Family History  Problem Relation Age of Onset  . Colon polyps Sister     Social History: Social History   Socioeconomic History  . Marital status: Single    Spouse name: Not on file  . Number of children: Not on file  . Years of education: Not on file  . Highest education level: Not on file  Occupational History  . Not on file  Social Needs  . Financial resource strain: Not on file  . Food insecurity:    Worry: Not on file    Inability: Not on file  . Transportation needs:    Medical: Not on file    Non-medical: Not on file  Tobacco Use  . Smoking status: Current Some Day Smoker    Packs/day: 1.00    Years: 35.00    Pack years: 35.00    Types: Cigarettes    Last attempt to quit: 12/22/2011    Years since quitting: 6.3  . Smokeless tobacco: Never Used  Substance and Sexual Activity  . Alcohol use: Yes    Alcohol/week: 14.0 standard drinks    Types: 14 Cans of beer per week    Comment: BEER AND OTHER ALCOHOL DAILY --MAYBE 6 PACK BEER AND 1 PINT LIQUOR EVERY OTHER DAY--DENIES WITHDRWAL  . Drug use: No  . Sexual activity: Not on file  Lifestyle  . Physical activity:    Days per week: Not on file    Minutes per session: Not on file  . Stress: Not on file  Relationships  . Social connections:    Talks on phone: Not on file    Gets together: Not on file    Attends religious service: Not on file    Active member of club or organization: Not on file    Attends meetings of clubs or organizations: Not on file    Relationship status: Not on file  . Intimate partner violence:    Fear of current or ex partner: Not on file     Emotionally abused: Not on file    Physically abused: Not on file    Forced sexual activity: Not on file  Other Topics Concern  . Not on file  Social History Narrative  . Not on file    Vital Signs: Blood pressure (!) 142/82, pulse 72, resp. rate 16, height 5\' 7"  (1.702 m), weight 179 lb (81.2 kg), SpO2 93 %.  Examination: General Appearance: The patient is well-developed, well-nourished, and in no distress. Skin: Gross inspection of skin unremarkable. Head: normocephalic, no gross deformities.  Eyes: no gross deformities noted. ENT: ears appear grossly normal no exudates. Neck: Supple. No thyromegaly. No LAD. Respiratory: Clear to auscultation bilateraly. Cardiovascular: Normal S1 and S2 without murmur or rub. Extremities: No cyanosis. pulses are equal. Neurologic: Alert and oriented. No involuntary movements.  LABS: Recent Results (from the past 2160 hour(s))  I-STAT creatinine     Status: Abnormal   Collection Time: 03/02/18  2:44 PM  Result Value Ref Range   Creatinine, Ser 0.60 (L) 0.61 - 1.24 mg/dL    Radiology: Ir Radiologist Eval & Mgmt  Result Date: 03/08/2018 Please refer to notes tab for details about interventional procedure. (Op Note)   No results found.  No results found.    Assessment and Plan: Patient Active Problem List   Diagnosis Date Noted  . GERD (gastroesophageal reflux disease) 02/08/2018  . Obstructive chronic bronchitis without exacerbation ([place]) 02/08/2018  . Encounter for general adult medical examination with abnormal findings 12/18/2017  . Atherosclerosis of aorta ([place]) 12/18/2017  . Dysuria 12/18/2017  . CVA (cerebral vascular accident) ([place]) 11/01/2017  . HOH (hard of hearing) 11/01/2017  . HTN (hypertension) 11/01/2017  . Hyperlipidemia, unspecified 11/01/2017  . Hx of adenomatous polyp of colon 12/16/2016  . Squamous cell lung cancer ([place])   . Lung cancer, 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[place]) 05/07/2012    1. Obstructive chronic bronchitis  without exacerbation (HCC) Obtain PFT's.  Pt doing well at this time. Continue to use Symbicort.   - Pulmonary Function Test; Future  2. Essential hypertension Repeat BP:122/78.  Continue BP meds as indicated.   3. Gastroesophageal reflux disease without esophagitis Good results with Protonix.   4. Squamous cell lung cancer, left (HCC) Stable per Ct Scan this year.   5. SOB (shortness of breath) - Spirometry with Graph  General Counseling: I have discussed the findings of the evaluation and examination with Derek Webb.  I have also discussed any further diagnostic evaluation thatmay be needed or ordered today. Derek Webb verbalizes understanding of the findings of todays visit. We also reviewed his medications today and discussed drug interactions and side effects including but not limited excessive drowsiness and altered mental states. We also discussed that there is always a risk not just to him but also people around him. he has been encouraged to call the office with any questions or concerns that should arise related to todays visit.    Time spent: 25 This patient was seen by Orson Gear AGNP-C in Collaboration with Dr. Devona Konig as a part of collaborative care agreement.   I have personally obtained a history, examined the patient, evaluated laboratory and imaging results, formulated the assessment and plan and placed orders.    Allyne Gee, MD [facility] Pulmonary and Critical Care Sleep medicine

## 2018-05-09 NOTE — Patient Instructions (Signed)

## 2018-05-24 ENCOUNTER — Ambulatory Visit: Payer: Self-pay | Admitting: Internal Medicine

## 2018-05-24 ENCOUNTER — Other Ambulatory Visit: Payer: Self-pay | Admitting: Nurse Practitioner

## 2018-05-24 MED ORDER — PANTOPRAZOLE SODIUM 40 MG PO TBEC: 40.0000 mg | DELAYED_RELEASE_TABLET | Freq: Every day | ORAL | 3 refills | Status: DC

## 2018-05-24 MED ORDER — ATORVASTATIN CALCIUM 80 MG PO TABS: 80.0000 mg | ORAL_TABLET | Freq: Every day | ORAL | 5 refills | Status: DC

## 2018-06-07 ENCOUNTER — Ambulatory Visit: Payer: Self-pay | Admitting: Internal Medicine

## 2018-06-23 ENCOUNTER — Other Ambulatory Visit: Payer: Self-pay | Admitting: Nurse Practitioner

## 2018-07-05 ENCOUNTER — Ambulatory Visit (INDEPENDENT_AMBULATORY_CARE_PROVIDER_SITE_OTHER): Payer: Medicare Other | Admitting: Internal Medicine

## 2018-07-05 DIAGNOSIS — R0602 Shortness of breath: Secondary | ICD-10-CM | POA: Diagnosis not present

## 2018-07-05 DIAGNOSIS — J449 Chronic obstructive pulmonary disease, unspecified: Secondary | ICD-10-CM

## 2018-07-05 LAB — PULMONARY FUNCTION TEST

## 2018-07-12 NOTE — Procedures (Signed)
[facility] [place] [address] [place], [address]  DATE OF SERVICE: July 05, 2018  Complete Pulmonary Function Testing Interpretation:  FINDINGS:  The forced vital capacity is normal the FEV1 is 0.79 L which is 32% of predicted and is severely decreased.  FEV1 FVC ratio is severely decreased.  Postbronchodilator there is no significant improvement in the FEV1 however clinical improvement may occur in the absence of spirometric improvement.  Total lung capacity is increased by plethysmography.  Residual volume is increased residual volume total lung capacity ratio is increased FRC is increased.  DLCO is severely decreased.  IMPRESSION:  This pulmonary function study is consistent with severe obstructive lung disease.  There does not appear to be any improvement after bronchodilator by spirometric criteria however clinical improvement may still occur in the absence of spirometric improvement and does not preclude the use of bronchodilators.  Allyne Gee, MD [facility] Pulmonary Critical Care Medicine Sleep Medicine

## 2018-08-10 ENCOUNTER — Ambulatory Visit: Payer: Self-pay | Admitting: Nurse Practitioner

## 2018-08-11 ENCOUNTER — Ambulatory Visit: Payer: Self-pay | Admitting: Nurse Practitioner

## 2018-08-17 ENCOUNTER — Ambulatory Visit (INDEPENDENT_AMBULATORY_CARE_PROVIDER_SITE_OTHER): Payer: Medicare Other | Admitting: Adult Health

## 2018-08-17 ENCOUNTER — Encounter: Payer: Self-pay | Admitting: Adult Health

## 2018-08-17 VITALS — BP 130/72 | HR 72 | Resp 16 | Ht 67.0 in | Wt 177.8 lb

## 2018-08-17 DIAGNOSIS — K219 Gastro-esophageal reflux disease without esophagitis: Secondary | ICD-10-CM | POA: Diagnosis not present

## 2018-08-17 DIAGNOSIS — E785 Hyperlipidemia, unspecified: Secondary | ICD-10-CM

## 2018-08-17 DIAGNOSIS — R569 Unspecified convulsions: Secondary | ICD-10-CM

## 2018-08-17 DIAGNOSIS — I10 Essential (primary) hypertension: Secondary | ICD-10-CM | POA: Diagnosis not present

## 2018-08-17 DIAGNOSIS — J449 Chronic obstructive pulmonary disease, unspecified: Secondary | ICD-10-CM

## 2018-08-17 DIAGNOSIS — I700 Atherosclerosis of aorta: Secondary | ICD-10-CM

## 2018-08-17 MED ORDER — ATORVASTATIN CALCIUM 80 MG PO TABS: 80.0000 mg | ORAL_TABLET | Freq: Every day | ORAL | 2 refills | Status: DC

## 2018-08-17 MED ORDER — PANTOPRAZOLE SODIUM 40 MG PO TBEC: 40.0000 mg | DELAYED_RELEASE_TABLET | Freq: Every day | ORAL | 2 refills | Status: DC

## 2018-08-17 MED ORDER — CLOPIDOGREL BISULFATE 75 MG PO TABS: 75.0000 mg | ORAL_TABLET | Freq: Every day | ORAL | 2 refills | Status: DC

## 2018-08-17 MED ORDER — EZETIMIBE 10 MG PO TABS: 10.0000 mg | ORAL_TABLET | Freq: Every day | ORAL | 2 refills | Status: DC

## 2018-08-17 MED ORDER — PHENYTOIN SODIUM EXTENDED 100 MG PO CAPS: 400.0000 mg | ORAL_CAPSULE | Freq: Every day | ORAL | 3 refills | Status: DC

## 2018-08-17 MED ORDER — ENALAPRIL MALEATE 20 MG PO TABS: 20.0000 mg | ORAL_TABLET | Freq: Every day | ORAL | 2 refills | Status: DC

## 2018-08-17 NOTE — Progress Notes (Signed)
[facility] [place], [place] [address]  Internal MEDICINE  Office Visit Note  Patient Name: Derek Webb  [id number]  [id number]  Date of Service: 08/17/2018  Chief Complaint  Patient presents with  . Follow-up  . Hyperlipidemia  . Hypertension  . Gastroesophageal Reflux    HPI Pt is here for follow up on HLD, HTN, GERD and COPD.  Patient in exam today with caregiver.  He denies any complaint currently.  His hypertension has been well controlled as well as his GERD.  He denies any recent issues with chest pain, shortness of breath, or palpitations.  He is requesting refills on multiple medications.    Current Medication: Outpatient Encounter Medications as of 08/17/2018  Medication Sig  . atorvastatin (LIPITOR) 80 MG tablet Take 1 tablet (80 mg total) by mouth daily.  . clopidogrel (PLAVIX) 75 MG tablet Take 1 tablet (75 mg total) by mouth daily.  . enalapril (VASOTEC) 20 MG tablet Take 1 tablet (20 mg total) by mouth daily.  Marland Kitchen ezetimibe (ZETIA) 10 MG tablet Take 1 tablet (10 mg total) by mouth daily.  . pantoprazole (PROTONIX) 40 MG tablet Take 1 tablet (40 mg total) by mouth daily. IN AM  . phenytoin (DILANTIN) 100 MG ER capsule Take 4 capsules (400 mg total) by mouth at bedtime.  . [DISCONTINUED] atorvastatin (LIPITOR) 80 MG tablet Take 1 tablet (80 mg total) by mouth daily. (Patient taking differently: Take 80 mg by mouth at bedtime. )  . [DISCONTINUED] clopidogrel (PLAVIX) 75 MG tablet Take 1 tablet (75 mg total) by mouth daily.  . [DISCONTINUED] enalapril (VASOTEC) 20 MG tablet Take 1 tablet (20 mg total) by mouth daily.  . [DISCONTINUED] ezetimibe (ZETIA) 10 MG tablet Take 1 tablet (10 mg total) by mouth daily.  . [DISCONTINUED] pantoprazole (PROTONIX) 40 MG tablet Take 1 tablet (40 mg total) by mouth daily. IN AM  . [DISCONTINUED] phenytoin (DILANTIN) 100 MG ER capsule Take 4 capsules (400 mg total) by mouth at bedtime.  Marland Kitchen aspirin 325 MG  tablet Take 81 mg by mouth daily. Reported on 01/21/2016  . aspirin 81 MG tablet Take 81 mg by mouth daily.  . budesonide-formoterol (SYMBICORT) 160-4.5 MCG/ACT inhaler Inhale 1 puff into the lungs 2 (two) times daily. (Patient not taking: Reported on 08/17/2018)  . Cholecalciferol (VITAMIN D) 2000 UNITS CAPS Take by mouth. VITAMIN D 3 2000 IU  ONE SOFTGEL DAILY  --OTC  . ferrous sulfate 325 (65 FE) MG tablet Take 325 mg by mouth daily with breakfast. Reported on 01/21/2016  . quinapril (ACCUPRIL) 10 MG tablet Take 10 mg by mouth at bedtime.   No facility-administered encounter medications on file as of 08/17/2018.     Surgical History: Past Surgical History:  Procedure Laterality Date  . CAROTID SURGERY 2009 -LEFT    . COLON POLYP EXCISION    . COLON SURGERY    . COLONOSCOPY WITH PROPOFOL N/A 04/18/2017   Procedure: COLONOSCOPY WITH PROPOFOL;  Surgeon: Manya Silvas, MD;  Location: [facility] ENDOSCOPY;  Service: Endoscopy;  Laterality: N/A;  . ESOPHAGOGASTRODUODENOSCOPY    . IR GENERIC HISTORICAL  01/21/2016   IR RADIOLOGIST EVAL & MGMT 01/21/2016 Aletta Edouard, MD [facility] INTERV RAD  . IR GENERIC HISTORICAL  10/22/2014   IR RADIOLOGIST EVAL & MGMT 10/22/2014 Aletta Edouard, MD [facility] INTERV RAD  . IR RADIOLOGIST EVAL & MGMT  02/02/2017  . IR RADIOLOGIST EVAL & MGMT  03/08/2018  . percutaneous biopsy  Medical History: Past Medical History:  Diagnosis Date  . Alcoholism ([place])   . COPD (chronic obstructive pulmonary disease) (HCC)    SEVERE COPD -OCCASIONALLY USES OXYGEN AT NIGHT-DOES NOT USE INHALERS ON REGULAR BASIS  . Elevated cholesterol   . GERD (gastroesophageal reflux disease)   . HOH (hard of hearing)   . Hypertension   . Lung cancer ([place])    LEFT UPPER LUNG CARCINOMA-NOT A CANDIDATE FOR SURGICAL RESECTION BECAUSE OF HIS COPD AND POOR CANDIDATE FOR RADIATION BECAUSE OF TRANSPORTATION PROBLEMS  . Seizures ([place])    CHRONIC DILATIN - PT STATES NO SEIZURES IN PAST COUPLE OF YEARS;  PT STATES HIS SEIZURES CAUSED NUMBNESS OF ARMS AND HANDS AND NOT ABLE TO MOVE HIS ARMS  . Stroke ([place])    2 TO 3 YRS AGO-AFFECTED HIS SPEECH--ABLE TO AMBULATE WITHOUT ASSIST AND DOES YARD WORK.  DECREASED HEARING IN BOTH EARS SINCE STROKE.  Marland Kitchen Vitamin D deficiency     Family History: Family History  Problem Relation Age of Onset  . Colon polyps Sister     Social History   Socioeconomic History  . Marital status: Single    Spouse name: Not on file  . Number of children: Not on file  . Years of education: Not on file  . Highest education level: Not on file  Occupational History  . Not on file  Social Needs  . Financial resource strain: Not on file  . Food insecurity:    Worry: Not on file    Inability: Not on file  . Transportation needs:    Medical: Not on file    Non-medical: Not on file  Tobacco Use  . Smoking status: Former Smoker    Packs/day: 1.00    Years: 35.00    Pack years: 35.00    Types: Cigarettes    Last attempt to quit: 12/22/2011    Years since quitting: 6.6  . Smokeless tobacco: Never Used  Substance and Sexual Activity  . Alcohol use: Not Currently    Alcohol/week: 14.0 standard drinks    Types: 14 Cans of beer per week    Comment:    . Drug use: No  . Sexual activity: Not on file  Lifestyle  . Physical activity:    Days per week: Not on file    Minutes per session: Not on file  . Stress: Not on file  Relationships  . Social connections:    Talks on phone: Not on file    Gets together: Not on file    Attends religious service: Not on file    Active member of club or organization: Not on file    Attends meetings of clubs or organizations: Not on file    Relationship status: Not on file  . Intimate partner violence:    Fear of current or ex partner: Not on file    Emotionally abused: Not on file    Physically abused: Not on file    Forced sexual activity: Not on file  Other Topics Concern  . Not on file  Social History Narrative  . Not on  file      Review of Systems  Constitutional: Negative.  Negative for chills, fatigue and unexpected weight change.  HENT: Negative.  Negative for congestion, rhinorrhea, sneezing and sore throat.   Eyes: Negative for redness.  Respiratory: Negative.  Negative for cough, chest tightness and shortness of breath.   Cardiovascular: Negative.  Negative for chest pain and palpitations.  Gastrointestinal: Negative.  Negative for abdominal pain, constipation, diarrhea, nausea and vomiting.  Endocrine: Negative.   Genitourinary: Negative.  Negative for dysuria and frequency.  Musculoskeletal: Negative.  Negative for arthralgias, back pain, joint swelling and neck pain.  Skin: Negative.  Negative for rash.  Allergic/Immunologic: Negative.   Neurological: Negative.  Negative for tremors and numbness.  Hematological: Negative for adenopathy. Does not bruise/bleed easily.  Psychiatric/Behavioral: Negative.  Negative for behavioral problems, sleep disturbance and suicidal ideas. The patient is not nervous/anxious.     Vital Signs: BP 130/72   Pulse 72   Resp 16   Ht 5\' 7"  (1.702 m)   Wt 177 lb 12.8 oz (80.6 kg)   SpO2 95%   BMI 27.85 kg/m    Physical Exam Vitals signs and nursing note reviewed.  Constitutional:      General: He is not in acute distress.    Appearance: He is well-developed. He is not diaphoretic.  HENT:     Head: Normocephalic and atraumatic.     Mouth/Throat:     Pharynx: No oropharyngeal exudate.  Eyes:     Pupils: Pupils are equal, round, and reactive to light.  Neck:     Musculoskeletal: Normal range of motion and neck supple.     Thyroid: No thyromegaly.     Vascular: No JVD.     Trachea: No tracheal deviation.  Cardiovascular:     Rate and Rhythm: Normal rate and regular rhythm.     Heart sounds: Normal heart sounds. No murmur. No friction rub. No gallop.   Pulmonary:     Effort: Pulmonary effort is normal. No respiratory distress.     Breath sounds:  Normal breath sounds. No wheezing or rales.  Chest:     Chest wall: No tenderness.  Abdominal:     Palpations: Abdomen is soft.     Tenderness: There is no abdominal tenderness. There is no guarding.  Musculoskeletal: Normal range of motion.  Lymphadenopathy:     Cervical: No cervical adenopathy.  Skin:    General: Skin is warm and dry.  Neurological:     Mental Status: He is alert and oriented to person, place, and time.     Cranial Nerves: No cranial nerve deficit.  Psychiatric:        Behavior: Behavior normal.        Thought Content: Thought content normal.        Judgment: Judgment normal.    Assessment/Plan: 1. Essential hypertension Stable, continue current medications as prescribed.  Patient's enalapril and Zetia refilled. - ezetimibe (ZETIA) 10 MG tablet; Take 1 tablet (10 mg total) by mouth daily.  Dispense: 90 tablet; Refill: 2 - enalapril (VASOTEC) 20 MG tablet; Take 1 tablet (20 mg total) by mouth daily.  Dispense: 90 tablet; Refill: 2  2. Chronic obstructive pulmonary disease, unspecified COPD type ([place]) , Continue current medications.  3. Hyperlipidemia, unspecified hyperlipidemia type Patient's Lipitor prescription refilled at this time.  - atorvastatin (LIPITOR) 80 MG tablet; Take 1 tablet (80 mg total) by mouth daily.  Dispense: 90 tablet; Refill: 2  4. Gastroesophageal reflux disease without esophagitis Stable, patient denies any symptoms.  Protonix prescription renewed at this time. - pantoprazole (PROTONIX) 40 MG tablet; Take 1 tablet (40 mg total) by mouth daily. IN AM  Dispense: 90 tablet; Refill: 2  5. Seizures ([place]) Patient's Dilantin is refilled at visit. - phenytoin (DILANTIN) 100 MG ER capsule; Take 4 capsules (400 mg total) by mouth at bedtime.  Dispense: 120 capsule; Refill:  3  6. Atherosclerosis of aorta (HCC) Patient's Plavix refilled at this visit. - clopidogrel (PLAVIX) 75 MG tablet; Take 1 tablet (75 mg total) by mouth daily.  Dispense:  90 tablet; Refill: 2  General Counseling: Rasheen verbalizes understanding of the findings of todays visit and agrees with plan of treatment. I have discussed any further diagnostic evaluation that may be needed or ordered today. We also reviewed his medications today. he has been encouraged to call the office with any questions or concerns that should arise related to todays visit.    Orders Placed This Encounter  Procedures  . Dilantin (Phenytoin) level, total    Meds ordered this encounter  Medications  . ezetimibe (ZETIA) 10 MG tablet    Sig: Take 1 tablet (10 mg total) by mouth daily.    Dispense:  90 tablet    Refill:  2  . clopidogrel (PLAVIX) 75 MG tablet    Sig: Take 1 tablet (75 mg total) by mouth daily.    Dispense:  90 tablet    Refill:  2  . phenytoin (DILANTIN) 100 MG ER capsule    Sig: Take 4 capsules (400 mg total) by mouth at bedtime.    Dispense:  120 capsule    Refill:  3  . pantoprazole (PROTONIX) 40 MG tablet    Sig: Take 1 tablet (40 mg total) by mouth daily. IN AM    Dispense:  90 tablet    Refill:  2  . enalapril (VASOTEC) 20 MG tablet    Sig: Take 1 tablet (20 mg total) by mouth daily.    Dispense:  90 tablet    Refill:  2  . atorvastatin (LIPITOR) 80 MG tablet    Sig: Take 1 tablet (80 mg total) by mouth daily.    Dispense:  90 tablet    Refill:  2    Time spent: 25 Minutes   This patient was seen by Orson Gear AGNP-C in Collaboration with Dr Lavera Guise as a part of collaborative care agreement     Kendell Bane AGNP-C Internal medicine

## 2018-08-17 NOTE — Patient Instructions (Signed)

## 2018-11-03 ENCOUNTER — Telehealth: Payer: Self-pay

## 2018-11-03 ENCOUNTER — Other Ambulatory Visit: Payer: Self-pay

## 2018-11-03 MED ORDER — BUDESONIDE-FORMOTEROL FUMARATE 160-4.5 MCG/ACT IN AERO: 1.0000 | INHALATION_SPRAY | Freq: Two times a day (BID) | RESPIRATORY_TRACT | 3 refills | Status: DC

## 2018-11-03 NOTE — Telephone Encounter (Signed)
Spoke with pt sister showed 07/28/18 is not taking is error pt been using symbicort

## 2018-11-07 ENCOUNTER — Ambulatory Visit (INDEPENDENT_AMBULATORY_CARE_PROVIDER_SITE_OTHER): Payer: Medicare Other | Admitting: Internal Medicine

## 2018-11-07 ENCOUNTER — Other Ambulatory Visit: Payer: Self-pay | Admitting: Adult Health

## 2018-11-07 ENCOUNTER — Encounter: Payer: Self-pay | Admitting: Internal Medicine

## 2018-11-07 VITALS — BP 120/78 | HR 87 | Resp 16 | Ht 67.0 in | Wt 180.0 lb

## 2018-11-07 DIAGNOSIS — I10 Essential (primary) hypertension: Secondary | ICD-10-CM | POA: Diagnosis not present

## 2018-11-07 DIAGNOSIS — R569 Unspecified convulsions: Secondary | ICD-10-CM

## 2018-11-07 DIAGNOSIS — J449 Chronic obstructive pulmonary disease, unspecified: Secondary | ICD-10-CM | POA: Diagnosis not present

## 2018-11-07 DIAGNOSIS — K219 Gastro-esophageal reflux disease without esophagitis: Secondary | ICD-10-CM

## 2018-11-07 DIAGNOSIS — R0602 Shortness of breath: Secondary | ICD-10-CM

## 2018-11-07 MED ORDER — PHENYTOIN SODIUM EXTENDED 100 MG PO CAPS: 400.0000 mg | ORAL_CAPSULE | Freq: Every day | ORAL | 2 refills | Status: DC

## 2018-11-07 NOTE — Progress Notes (Signed)
Refilled patients dilantin, and ordered new level.

## 2018-11-07 NOTE — Patient Instructions (Signed)
Chronic Obstructive Pulmonary Disease Chronic obstructive pulmonary disease (COPD) is a long-term (chronic) lung problem. When you have COPD, it is hard for air to get in and out of your lungs. Usually the condition gets worse over time, and your lungs will never return to normal. There are things you can do to keep yourself as healthy as possible.  Your doctor may treat your condition with: ? Medicines. ? Oxygen. ? Lung surgery.  Your doctor may also recommend: ? Rehabilitation. This includes steps to make your body work better. It may involve a team of specialists. ? Quitting smoking, if you smoke. ? Exercise and changes to your diet. ? Comfort measures (palliative care). Follow these instructions at home: Medicines  Take over-the-counter and prescription medicines only as told by your doctor.  Talk to your doctor before taking any cough or allergy medicines. You may need to avoid medicines that cause your lungs to be dry. Lifestyle  If you smoke, stop. Smoking makes the problem worse. If you need help quitting, ask your doctor.  Avoid being around things that make your breathing worse. This may include smoke, chemicals, and fumes.  Stay active, but remember to rest as well.  Learn and use tips on how to relax.  Make sure you get enough sleep. Most adults need at least 7 hours of sleep every night.  Eat healthy foods. Eat smaller meals more often. Rest before meals. Controlled breathing Learn and use tips on how to control your breathing as told by your doctor. Try:  Breathing in (inhaling) through your nose for 1 second. Then, pucker your lips and breath out (exhale) through your lips for 2 seconds.  Putting one hand on your belly (abdomen). Breathe in slowly through your nose for 1 second. Your hand on your belly should move out. Pucker your lips and breathe out slowly through your lips. Your hand on your belly should move in as you breathe out.  Controlled coughing Learn  and use controlled coughing to clear mucus from your lungs. Follow these steps: 1. Lean your head a little forward. 2. Breathe in deeply. 3. Try to hold your breath for 3 seconds. 4. Keep your mouth slightly open while coughing 2 times. 5. Spit any mucus out into a tissue. 6. Rest and do the steps again 1 or 2 times as needed. General instructions  Make sure you get all the shots (vaccines) that your doctor recommends. Ask your doctor about a flu shot and a pneumonia shot.  Use oxygen therapy and pulmonary rehabilitation if told by your doctor. If you need home oxygen therapy, ask your doctor if you should buy a tool to measure your oxygen level (oximeter).  Make a COPD action plan with your doctor. This helps you to know what to do if you feel worse than usual.  Manage any other conditions you have as told by your doctor.  Avoid going outside when it is very hot, cold, or humid.  Avoid people who have a sickness you can catch (contagious).  Keep all follow-up visits as told by your doctor. This is important. Contact a doctor if:  You cough up more mucus than usual.  There is a change in the color or thickness of the mucus.  It is harder to breathe than usual.  Your breathing is faster than usual.  You have trouble sleeping.  You need to use your medicines more often than usual.  You have trouble doing your normal activities such as getting dressed   or walking around the house. Get help right away if:  You have shortness of breath while resting.  You have shortness of breath that stops you from: ? Being able to talk. ? Doing normal activities.  Your chest hurts for longer than 5 minutes.  Your skin color is more blue than usual.  Your pulse oximeter shows that you have low oxygen for longer than 5 minutes.  You have a fever.  You feel too tired to breathe normally. Summary  Chronic obstructive pulmonary disease (COPD) is a long-term lung problem.  The way your  lungs work will never return to normal. Usually the condition gets worse over time. There are things you can do to keep yourself as healthy as possible.  Take over-the-counter and prescription medicines only as told by your doctor.  If you smoke, stop. Smoking makes the problem worse. This information is not intended to replace advice given to you by your health care provider. Make sure you discuss any questions you have with your health care provider. Document Released: 01/26/2008 Document Revised: 09/13/2016 Document Reviewed: 09/13/2016 Elsevier Interactive Patient Education  2019 Elsevier Inc.  

## 2018-11-07 NOTE — Progress Notes (Signed)
[facility] [place], [place] [address]  Pulmonary Sleep Medicine   Office Visit Note  Patient Name: Derek Webb DOB: 11-29-1950 MRN [id number]  Date of Service: 11/07/2018  Complaints/HPI: Pt is here for follow up on copd.  Overall he has been doing well since his last visit.  He denies any hospitalizations.  No chest pain, SOB, palpitations, hemoptysis or sinus issues.   ROS  General: (-) fever, (-) chills, (-) night sweats, (-) weakness Skin: (-) rashes, (-) itching,. Eyes: (-) visual changes, (-) redness, (-) itching. Nose and Sinuses: (-) nasal stuffiness or itchiness, (-) postnasal drip, (-) nosebleeds, (-) sinus trouble. Mouth and Throat: (-) sore throat, (-) hoarseness. Neck: (-) swollen glands, (-) enlarged thyroid, (-) neck pain. Respiratory: - cough, (-) bloody sputum, - shortness of breath, - wheezing. Cardiovascular: - ankle swelling, (-) chest pain. Lymphatic: (-) lymph node enlargement. Neurologic: (-) numbness, (-) tingling. Psychiatric: (-) anxiety, (-) depression   Current Medication: Outpatient Encounter Medications as of 11/07/2018  Medication Sig  . aspirin 325 MG tablet Take 81 mg by mouth daily. Reported on 01/21/2016  . aspirin 81 MG tablet Take 81 mg by mouth daily.  Marland Kitchen atorvastatin (LIPITOR) 80 MG tablet Take 1 tablet (80 mg total) by mouth daily.  . budesonide-formoterol (SYMBICORT) 160-4.5 MCG/ACT inhaler Inhale 1 puff into the lungs 2 (two) times daily.  . Cholecalciferol (VITAMIN D) 2000 UNITS CAPS Take by mouth. VITAMIN D 3 2000 IU  ONE SOFTGEL DAILY  --OTC  . clopidogrel (PLAVIX) 75 MG tablet Take 1 tablet (75 mg total) by mouth daily.  . enalapril (VASOTEC) 20 MG tablet Take 1 tablet (20 mg total) by mouth daily.  Marland Kitchen ezetimibe (ZETIA) 10 MG tablet Take 1 tablet (10 mg total) by mouth daily.  . ferrous sulfate 325 (65 FE) MG tablet Take 325 mg by mouth daily with breakfast. Reported on 01/21/2016  . pantoprazole  (PROTONIX) 40 MG tablet Take 1 tablet (40 mg total) by mouth daily. IN AM  . phenytoin (DILANTIN) 100 MG ER capsule Take 4 capsules (400 mg total) by mouth at bedtime.  . quinapril (ACCUPRIL) 10 MG tablet Take 10 mg by mouth at bedtime.   No facility-administered encounter medications on file as of 11/07/2018.     Surgical History: Past Surgical History:  Procedure Laterality Date  . CAROTID SURGERY 2009 -LEFT    . COLON POLYP EXCISION    . COLON SURGERY    . COLONOSCOPY WITH PROPOFOL N/A 04/18/2017   Procedure: COLONOSCOPY WITH PROPOFOL;  Surgeon: Manya Silvas, MD;  Location: [facility] ENDOSCOPY;  Service: Endoscopy;  Laterality: N/A;  . ESOPHAGOGASTRODUODENOSCOPY    . IR GENERIC HISTORICAL  01/21/2016   IR RADIOLOGIST EVAL & MGMT 01/21/2016 Aletta Edouard, MD [facility] INTERV RAD  . IR GENERIC HISTORICAL  10/22/2014   IR RADIOLOGIST EVAL & MGMT 10/22/2014 Aletta Edouard, MD [facility] INTERV RAD  . IR RADIOLOGIST EVAL & MGMT  02/02/2017  . IR RADIOLOGIST EVAL & MGMT  03/08/2018  . percutaneous biopsy      Medical History: Past Medical History:  Diagnosis Date  . Alcoholism ([place])   . COPD (chronic obstructive pulmonary disease) (HCC)    SEVERE COPD -OCCASIONALLY USES OXYGEN AT NIGHT-DOES NOT USE INHALERS ON REGULAR BASIS  . Elevated cholesterol   . GERD (gastroesophageal reflux disease)   . HOH (hard of hearing)   . Hypertension   . Lung cancer ([place])    LEFT UPPER LUNG CARCINOMA-NOT A  CANDIDATE FOR SURGICAL RESECTION BECAUSE OF HIS COPD AND POOR CANDIDATE FOR RADIATION BECAUSE OF TRANSPORTATION PROBLEMS  . Seizures ([place])    CHRONIC DILATIN - PT STATES NO SEIZURES IN PAST COUPLE OF YEARS; PT STATES HIS SEIZURES CAUSED NUMBNESS OF ARMS AND HANDS AND NOT ABLE TO MOVE HIS ARMS  . Stroke ([place])    2 TO 3 YRS AGO-AFFECTED HIS SPEECH--ABLE TO AMBULATE WITHOUT ASSIST AND DOES YARD WORK.  DECREASED HEARING IN BOTH EARS SINCE STROKE.  Marland Kitchen Vitamin D deficiency     Family History: Family History   Problem Relation Age of Onset  . Colon polyps Sister     Social History: Social History   Socioeconomic History  . Marital status: Single    Spouse name: Not on file  . Number of children: Not on file  . Years of education: Not on file  . Highest education level: Not on file  Occupational History  . Not on file  Social Needs  . Financial resource strain: Not on file  . Food insecurity:    Worry: Not on file    Inability: Not on file  . Transportation needs:    Medical: Not on file    Non-medical: Not on file  Tobacco Use  . Smoking status: Former Smoker    Packs/day: 1.00    Years: 35.00    Pack years: 35.00    Types: Cigarettes    Last attempt to quit: 12/22/2011    Years since quitting: 6.8  . Smokeless tobacco: Never Used  Substance and Sexual Activity  . Alcohol use: Not Currently    Alcohol/week: 14.0 standard drinks    Types: 14 Cans of beer per week    Comment:    . Drug use: No  . Sexual activity: Not on file  Lifestyle  . Physical activity:    Days per week: Not on file    Minutes per session: Not on file  . Stress: Not on file  Relationships  . Social connections:    Talks on phone: Not on file    Gets together: Not on file    Attends religious service: Not on file    Active member of club or organization: Not on file    Attends meetings of clubs or organizations: Not on file    Relationship status: Not on file  . Intimate partner violence:    Fear of current or ex partner: Not on file    Emotionally abused: Not on file    Physically abused: Not on file    Forced sexual activity: Not on file  Other Topics Concern  . Not on file  Social History Narrative  . Not on file    Vital Signs: Blood pressure 120/78, pulse 87, resp. rate 16, height 5\' 7"  (1.702 m), weight 180 lb (81.6 kg), SpO2 98 %.  Examination: General Appearance: The patient is well-developed, well-nourished, and in no distress. Skin: Gross inspection of skin unremarkable. Head:  normocephalic, no gross deformities. Eyes: no gross deformities noted. ENT: ears appear grossly normal no exudates. Neck: Supple. No thyromegaly. No LAD. Respiratory: clear bilateraly. Cardiovascular: Normal S1 and S2 without murmur or rub. Extremities: No cyanosis. pulses are equal. Neurologic: Alert and oriented. No involuntary movements.  LABS: No results found for this or any previous visit (from the past 2160 hour(s)).  Radiology: Ir Radiologist Eval & Mgmt  Result Date: 03/08/2018 Please refer to notes tab for details about interventional procedure. (Op Note)   No results  found.  No results found.    Assessment and Plan: Patient Active Problem List   Diagnosis Date Noted  . GERD (gastroesophageal reflux disease) 02/08/2018  . Obstructive chronic bronchitis without exacerbation ([place]) 02/08/2018  . Encounter for general adult medical examination with abnormal findings 12/18/2017  . Atherosclerosis of aorta ([place]) 12/18/2017  . Dysuria 12/18/2017  . CVA (cerebral vascular accident) ([place]) 11/01/2017  . HOH (hard of hearing) 11/01/2017  . HTN (hypertension) 11/01/2017  . Hyperlipidemia, unspecified 11/01/2017  . Hx of adenomatous polyp of colon 12/16/2016  . Squamous cell lung cancer ([place])   . Lung cancer, 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[place]) 05/07/2012   1. Chronic obstructive pulmonary disease, unspecified COPD type ([place]) Stable, continue using inhalers as prescribed.   2. Essential hypertension Stable, continue current medications.  3. Gastroesophageal reflux disease without esophagitis No symptoms currently.  Continue present therapy.   4. Shortness of breath FVC is 2.1 which is 72% of the pre-predicted value FEV1 is 0.8 which is 31% of the pre-predicted value FEV1/FVC is 39% which is 50% of predicted value on today's spirometry. - Spirometry with Graph  5. Seizures ([place]) Well controlled. No recent seizures.    General Counseling: I have discussed the findings of the  evaluation and examination with Derek Webb.  I have also discussed any further diagnostic evaluation thatmay be needed or ordered today. Derek Webb verbalizes understanding of the findings of todays visit. We also reviewed his medications today and discussed drug interactions and side effects including but not limited excessive drowsiness and altered mental states. We also discussed that there is always a risk not just to him but also people around him. he has been encouraged to call the office with any questions or concerns that should arise related to todays visit.    Time spent: 25 This patient was seen by Orson Gear AGNP-C in Collaboration with Dr. Devona Konig as a part of collaborative care agreement.   I have personally obtained a history, examined the patient, evaluated laboratory and imaging results, formulated the assessment and plan and placed orders.    Allyne Gee, MD [facility] Pulmonary and Critical Care Sleep medicine

## 2018-11-22 DIAGNOSIS — J449 Chronic obstructive pulmonary disease, unspecified: Secondary | ICD-10-CM | POA: Diagnosis not present

## 2018-12-22 DIAGNOSIS — J449 Chronic obstructive pulmonary disease, unspecified: Secondary | ICD-10-CM | POA: Diagnosis not present

## 2019-01-08 DIAGNOSIS — H905 Unspecified sensorineural hearing loss: Secondary | ICD-10-CM | POA: Diagnosis not present

## 2019-01-22 DIAGNOSIS — J449 Chronic obstructive pulmonary disease, unspecified: Secondary | ICD-10-CM | POA: Diagnosis not present

## 2019-02-15 ENCOUNTER — Ambulatory Visit: Payer: Self-pay | Admitting: Nurse Practitioner

## 2019-02-21 DIAGNOSIS — J449 Chronic obstructive pulmonary disease, unspecified: Secondary | ICD-10-CM | POA: Diagnosis not present

## 2019-02-26 ENCOUNTER — Other Ambulatory Visit: Payer: Self-pay | Admitting: Adult Health

## 2019-02-26 DIAGNOSIS — R569 Unspecified convulsions: Secondary | ICD-10-CM | POA: Diagnosis not present

## 2019-02-27 LAB — PHENYTOIN LEVEL, TOTAL: Phenytoin (Dilantin), Serum: 3.3 ug/mL — ABNORMAL LOW (ref 10.0–20.0)

## 2019-03-01 ENCOUNTER — Ambulatory Visit (INDEPENDENT_AMBULATORY_CARE_PROVIDER_SITE_OTHER): Payer: Medicare Other | Admitting: Nurse Practitioner

## 2019-03-01 ENCOUNTER — Other Ambulatory Visit: Payer: Self-pay

## 2019-03-01 ENCOUNTER — Encounter: Payer: Self-pay | Admitting: Nurse Practitioner

## 2019-03-01 VITALS — BP 146/86 | HR 63 | Temp 99.0°F | Resp 16 | Ht 67.0 in | Wt 187.6 lb

## 2019-03-01 DIAGNOSIS — I10 Essential (primary) hypertension: Secondary | ICD-10-CM | POA: Diagnosis not present

## 2019-03-01 DIAGNOSIS — I639 Cerebral infarction, unspecified: Secondary | ICD-10-CM | POA: Diagnosis not present

## 2019-03-01 DIAGNOSIS — K219 Gastro-esophageal reflux disease without esophagitis: Secondary | ICD-10-CM | POA: Diagnosis not present

## 2019-03-01 DIAGNOSIS — J4489 Other specified chronic obstructive pulmonary disease: Secondary | ICD-10-CM

## 2019-03-01 DIAGNOSIS — Z23 Encounter for immunization: Secondary | ICD-10-CM

## 2019-03-01 DIAGNOSIS — E782 Mixed hyperlipidemia: Secondary | ICD-10-CM

## 2019-03-01 DIAGNOSIS — J449 Chronic obstructive pulmonary disease, unspecified: Secondary | ICD-10-CM | POA: Diagnosis not present

## 2019-03-01 NOTE — Progress Notes (Signed)
[facility] [place],  [address]  Internal MEDICINE  Office Visit Note  Patient Name: Derek Webb  [id number]  [id number]  Date of Service: 03/05/2019  Chief Complaint  Patient presents with  . Medical Management of Chronic Issues    6 month routine follow up  . Hypertension  . Hyperlipidemia  . Gastroesophageal Reflux    The patiet is here for routine follow up visit. He is due to have routine, fasting labs drawn. He states that he feels well and has no concerns or complaints today.   Hypertension This is a chronic problem. The current episode started more than 1 year ago. The problem is unchanged. Associated symptoms include headaches. Pertinent negatives include no chest pain, neck pain, palpitations or shortness of breath. There are no associated agents to hypertension. Risk factors for coronary artery disease include dyslipidemia and male gender. Past treatments include ACE inhibitors. There are no compliance problems.  Hypertensive end-organ damage includes CAD/MI and CVA. Identifiable causes of hypertension include sleep apnea.       Current Medication: Outpatient Encounter Medications as of 03/01/2019  Medication Sig  . aspirin 325 MG tablet Take 81 mg by mouth daily. Reported on 01/21/2016  . aspirin 81 MG tablet Take 81 mg by mouth daily.  Marland Kitchen atorvastatin (LIPITOR) 80 MG tablet Take 1 tablet (80 mg total) by mouth daily.  . budesonide-formoterol (SYMBICORT) 160-4.5 MCG/ACT inhaler Inhale 1 puff into the lungs 2 (two) times daily.  . Cholecalciferol (VITAMIN D) 2000 UNITS CAPS Take by mouth. VITAMIN D 3 2000 IU  ONE SOFTGEL DAILY  --OTC  . clopidogrel (PLAVIX) 75 MG tablet Take 1 tablet (75 mg total) by mouth daily.  . enalapril (VASOTEC) 20 MG tablet Take 1 tablet (20 mg total) by mouth daily.  Marland Kitchen ezetimibe (ZETIA) 10 MG tablet Take 1 tablet (10 mg total) by mouth daily.  . ferrous sulfate 325 (65 FE) MG tablet Take 325 mg by mouth daily  with breakfast. Reported on 01/21/2016  . pantoprazole (PROTONIX) 40 MG tablet Take 1 tablet (40 mg total) by mouth daily. IN AM  . phenytoin (DILANTIN) 100 MG ER capsule Take 4 capsules (400 mg total) by mouth at bedtime.  . quinapril (ACCUPRIL) 10 MG tablet Take 10 mg by mouth at bedtime.   No facility-administered encounter medications on file as of 03/01/2019.     Surgical History: Past Surgical History:  Procedure Laterality Date  . CAROTID SURGERY 2009 -LEFT    . COLON POLYP EXCISION    . COLON SURGERY    . COLONOSCOPY WITH PROPOFOL N/A 04/18/2017   Procedure: COLONOSCOPY WITH PROPOFOL;  Surgeon: Manya Silvas, MD;  Location[facility] ENDOSCOPY;  Service: Endoscopy;  Laterality: N/A;  . ESOPHAGOGASTRODUODENOSCOPY    . IR GENERIC HISTORICAL  01/21/2016   IR RADIOLOGIST EVAL & MGMT 01/21/2016 Aletta Edouard, MD [facility] INTERV RAD  . IR GENERIC HISTORICAL  10/22/2014   IR RADIOLOGIST EVAL & MGMT 10/22/2014 Aletta Edouard, MD [facility] INTERV RAD  . IR RADIOLOGIST EVAL & MGMT  02/02/2017  . IR RADIOLOGIST EVAL & MGMT  03/08/2018  . percutaneous biopsy      Medical History: Past Medical History:  Diagnosis Date  . Alcoholism ([place])   . COPD (chronic obstructive pulmonary disease) (HCC)    SEVERE COPD -OCCASIONALLY USES OXYGEN AT NIGHT-DOES NOT USE INHALERS ON REGULAR BASIS  . Elevated cholesterol   . GERD (gastroesophageal reflux disease)   . HOH (hard of hearing)   .  Hypertension   . Lung cancer ([place])    LEFT UPPER LUNG CARCINOMA-NOT A CANDIDATE FOR SURGICAL RESECTION BECAUSE OF HIS COPD AND POOR CANDIDATE FOR RADIATION BECAUSE OF TRANSPORTATION PROBLEMS  . Seizures ([place])    CHRONIC DILATIN - PT STATES NO SEIZURES IN PAST COUPLE OF YEARS; PT STATES HIS SEIZURES CAUSED NUMBNESS OF ARMS AND HANDS AND NOT ABLE TO MOVE HIS ARMS  . Stroke ([place])    2 TO 3 YRS AGO-AFFECTED HIS SPEECH--ABLE TO AMBULATE WITHOUT ASSIST AND DOES YARD WORK.  DECREASED HEARING IN BOTH EARS SINCE STROKE.  Marland Kitchen Vitamin D  deficiency     Family History: Family History  Problem Relation Age of Onset  . Colon polyps Sister     Social History   Socioeconomic History  . Marital status: Single    Spouse name: Not on file  . Number of children: Not on file  . Years of education: Not on file  . Highest education level: Not on file  Occupational History  . Not on file  Social Needs  . Financial resource strain: Not on file  . Food insecurity    Worry: Not on file    Inability: Not on file  . Transportation needs    Medical: Not on file    Non-medical: Not on file  Tobacco Use  . Smoking status: Former Smoker    Packs/day: 1.00    Years: 35.00    Pack years: 35.00    Types: Cigarettes    Quit date: 12/22/2011    Years since quitting: 7.2  . Smokeless tobacco: Never Used  Substance and Sexual Activity  . Alcohol use: Not Currently    Alcohol/week: 14.0 standard drinks    Types: 14 Cans of beer per week    Comment:    . Drug use: No  . Sexual activity: Not on file  Lifestyle  . Physical activity    Days per week: Not on file    Minutes per session: Not on file  . Stress: Not on file  Relationships  . Social Herbalist on phone: Not on file    Gets together: Not on file    Attends religious service: Not on file    Active member of club or organization: Not on file    Attends meetings of clubs or organizations: Not on file    Relationship status: Not on file  . Intimate partner violence    Fear of current or ex partner: Not on file    Emotionally abused: Not on file    Physically abused: Not on file    Forced sexual activity: Not on file  Other Topics Concern  . Not on file  Social History Narrative  . Not on file      Review of Systems  Constitutional: Negative for activity change, chills, fatigue and unexpected weight change.  HENT: Negative for congestion, postnasal drip, rhinorrhea, sneezing, sore throat and voice change.   Respiratory: Negative for cough, chest  tightness, shortness of breath and wheezing.   Cardiovascular: Negative for chest pain and palpitations.       Blood pressure mildly elevated today  Gastrointestinal: Negative for abdominal distention, abdominal pain, constipation, diarrhea, nausea and vomiting.  Endocrine: Negative for cold intolerance, heat intolerance, polydipsia and polyuria.  Genitourinary: Negative for urgency.  Musculoskeletal: Negative for arthralgias, back pain, joint swelling, myalgias and neck pain.  Skin: Negative for rash.  Allergic/Immunologic: Negative for environmental allergies.  Neurological: Positive for headaches.  Negative for tremors and numbness.  Hematological: Negative for adenopathy. Does not bruise/bleed easily.  Psychiatric/Behavioral: Negative for behavioral problems (Depression), sleep disturbance and suicidal ideas. The patient is not nervous/anxious.     Today's Vitals   03/01/19 1153  BP: (!) 146/86  Pulse: 63  Resp: 16  Temp: 99 F (37.2 C)  SpO2: 97%  Weight: 187 lb 9.6 oz (85.1 kg)  Height: 5\' 7"  (1.702 m)   Body mass index is 29.38 kg/m.  Physical Exam Vitals signs and nursing note reviewed.  Constitutional:      General: He is not in acute distress.    Appearance: Normal appearance. He is well-developed. He is not diaphoretic.  HENT:     Head: Normocephalic and atraumatic.     Mouth/Throat:     Pharynx: No oropharyngeal exudate.  Eyes:     Conjunctiva/sclera: Conjunctivae normal.     Pupils: Pupils are equal, round, and reactive to light.  Neck:     Musculoskeletal: Normal range of motion and neck supple.     Thyroid: No thyromegaly.     Vascular: No carotid bruit or JVD.     Trachea: No tracheal deviation.  Cardiovascular:     Rate and Rhythm: Normal rate and regular rhythm.     Heart sounds: Normal heart sounds. No murmur. No friction rub. No gallop.   Pulmonary:     Effort: Pulmonary effort is normal. No respiratory distress.     Breath sounds: Normal breath  sounds. No wheezing or rales.     Comments: Some bilateral wheezing noted. Clears with cough.  Chest:     Chest wall: No tenderness.  Abdominal:     General: Bowel sounds are normal.     Palpations: Abdomen is soft.     Tenderness: There is no abdominal tenderness.  Musculoskeletal: Normal range of motion.  Lymphadenopathy:     Cervical: No cervical adenopathy.  Skin:    General: Skin is warm and dry.  Neurological:     Mental Status: He is alert and oriented to person, place, and time.     Cranial Nerves: No cranial nerve deficit.     Deep Tendon Reflexes: Reflexes are normal and symmetric.     Comments: Patient is at his neurollgical baseline.   Psychiatric:        Behavior: Behavior normal.        Thought Content: Thought content normal.        Judgment: Judgment normal.    Assessment/Plan: 1. Essential hypertension Generally stable. Continue bp medication as prescribed   2. Mixed hyperlipidemia Check fasting lipid panel. Adjust atorvastatin and as indicated. Lab slip provided.   3. Obstructive chronic bronchitis without exacerbation ([place]) Continue inhalers and respiratory medication as prescribed   4. Gastroesophageal reflux disease without esophagitis Continue to take pantoprazole as prescribed   5. Cerebrovascular accident (CVA), unspecified mechanism ([place]) Continue plavix as prescribed   6. Need for vaccination against Streptococcus pneumoniae using pneumococcal conjugate vaccine 13 Prescription for prvnar 13 sent to his pharmacy for administration.  - Pneumococcal conjugate vaccine 13-valent IM  General Counseling: Pattricia Boss understanding of the findings of todays visit and agrees with plan of treatment. I have discussed any further diagnostic evaluation that may be needed or ordered today. We also reviewed his medications today. he has been encouraged to call the office with any questions or concerns that should arise related to todays  visit.  Hypertension Counseling:   The following hypertensive lifestyle modification were  recommended and discussed:  1. Limiting alcohol intake to less than 1 oz/day of ethanol:(24 oz of beer or 8 oz of wine or 2 oz of 100-proof whiskey). 2. Take baby ASA 81 mg daily. 3. Importance of regular aerobic exercise and losing weight. 4. Reduce dietary saturated fat and cholesterol intake for overall cardiovascular health. 5. Maintaining adequate dietary potassium, calcium, and magnesium intake. 6. Regular monitoring of the blood pressure. 7. Reduce sodium intake to less than 100 mmol/day (less than 2.3 gm of sodium or less than 6 gm of sodium choride)   This patient was seen by [place] with Dr Lavera Guise as a part of collaborative care agreement  Orders Placed This Encounter  Procedures  . Pneumococcal conjugate vaccine 13-valent IM      Time spent: 25 Minutes      Dr Lavera Guise Internal medicine

## 2019-03-05 DIAGNOSIS — Z23 Encounter for immunization: Secondary | ICD-10-CM | POA: Insufficient documentation

## 2019-03-16 DIAGNOSIS — H25013 Cortical age-related cataract, bilateral: Secondary | ICD-10-CM | POA: Diagnosis not present

## 2019-03-16 DIAGNOSIS — H35013 Changes in retinal vascular appearance, bilateral: Secondary | ICD-10-CM | POA: Diagnosis not present

## 2019-03-16 DIAGNOSIS — H2513 Age-related nuclear cataract, bilateral: Secondary | ICD-10-CM | POA: Diagnosis not present

## 2019-03-20 ENCOUNTER — Other Ambulatory Visit: Payer: Self-pay | Admitting: Adult Health

## 2019-03-20 DIAGNOSIS — E785 Hyperlipidemia, unspecified: Secondary | ICD-10-CM

## 2019-03-24 DIAGNOSIS — J449 Chronic obstructive pulmonary disease, unspecified: Secondary | ICD-10-CM | POA: Diagnosis not present

## 2019-04-10 ENCOUNTER — Ambulatory Visit: Payer: Medicare Other | Admitting: Internal Medicine

## 2019-04-12 ENCOUNTER — Other Ambulatory Visit: Payer: Self-pay

## 2019-04-12 ENCOUNTER — Encounter: Payer: Self-pay | Admitting: Internal Medicine

## 2019-04-12 ENCOUNTER — Ambulatory Visit (INDEPENDENT_AMBULATORY_CARE_PROVIDER_SITE_OTHER): Payer: Medicare Other | Admitting: Internal Medicine

## 2019-04-12 VITALS — BP 136/88 | HR 64 | Resp 16 | Ht 67.0 in | Wt 180.0 lb

## 2019-04-12 DIAGNOSIS — J449 Chronic obstructive pulmonary disease, unspecified: Secondary | ICD-10-CM | POA: Diagnosis not present

## 2019-04-12 DIAGNOSIS — K219 Gastro-esophageal reflux disease without esophagitis: Secondary | ICD-10-CM | POA: Diagnosis not present

## 2019-04-12 DIAGNOSIS — I10 Essential (primary) hypertension: Secondary | ICD-10-CM | POA: Diagnosis not present

## 2019-04-12 DIAGNOSIS — R0602 Shortness of breath: Secondary | ICD-10-CM | POA: Diagnosis not present

## 2019-04-12 MED ORDER — BUDESONIDE-FORMOTEROL FUMARATE 160-4.5 MCG/ACT IN AERO: 1.0000 | INHALATION_SPRAY | Freq: Two times a day (BID) | RESPIRATORY_TRACT | 3 refills | Status: DC

## 2019-04-12 NOTE — Progress Notes (Signed)
[facility] [place], [place] [address]  Pulmonary Sleep Medicine   Office Visit Note  Patient Name: Derek Webb DOB: [birth year] MRN [id number]  Date of Service: 04/12/2019  Complaints/HPI: PT is here for follow up on copd.  He reports he is using his Symbicort inhaler and denies any issues.  Overall he is doing well.  Denies any new or worsening symptoms. No hospitalization, sob, hemoptysis or other issues.   He reports he stopped smoking about 1 year ago.   ROS  General: (-) fever, (-) chills, (-) night sweats, (-) weakness Skin: (-) rashes, (-) itching,. Eyes: (-) visual changes, (-) redness, (-) itching. Nose and Sinuses: (-) nasal stuffiness or itchiness, (-) postnasal drip, (-) nosebleeds, (-) sinus trouble. Mouth and Throat: (-) sore throat, (-) hoarseness. Neck: (-) swollen glands, (-) enlarged thyroid, (-) neck pain. Respiratory: - cough, (-) bloody sputum, - shortness of breath, - wheezing. Cardiovascular: - ankle swelling, (-) chest pain. Lymphatic: (-) lymph node enlargement. Neurologic: (-) numbness, (-) tingling. Psychiatric: (-) anxiety, (-) depression   Current Medication: Outpatient Encounter Medications as of 04/12/2019  Medication Sig  . aspirin 325 MG tablet Take 81 mg by mouth daily. Reported on 01/21/2016  . aspirin 81 MG tablet Take 81 mg by mouth daily.  Marland Kitchen atorvastatin (LIPITOR) 80 MG tablet Take 1 tablet (80 mg total) by mouth daily.  . budesonide-formoterol (SYMBICORT) 160-4.5 MCG/ACT inhaler Inhale 1 puff into the lungs 2 (two) times daily.  . Cholecalciferol (VITAMIN D) 2000 UNITS CAPS Take by mouth. VITAMIN D 3 2000 IU  ONE SOFTGEL DAILY  --OTC  . clopidogrel (PLAVIX) 75 MG tablet Take 1 tablet (75 mg total) by mouth daily.  . enalapril (VASOTEC) 20 MG tablet Take 1 tablet (20 mg total) by mouth daily.  Marland Kitchen ezetimibe (ZETIA) 10 MG tablet Take 1 tablet (10 mg total) by mouth daily.  . ferrous sulfate 325 (65 FE) MG tablet  Take 325 mg by mouth daily with breakfast. Reported on 01/21/2016  . OXYGEN Inhale into the lungs. At night  . pantoprazole (PROTONIX) 40 MG tablet Take 1 tablet (40 mg total) by mouth daily. IN AM  . phenytoin (DILANTIN) 100 MG ER capsule Take 4 capsules (400 mg total) by mouth at bedtime.  . quinapril (ACCUPRIL) 10 MG tablet Take 10 mg by mouth at bedtime.   No facility-administered encounter medications on file as of 04/12/2019.     Surgical History: Past Surgical History:  Procedure Laterality Date  . CAROTID SURGERY 2009 -LEFT    . COLON POLYP EXCISION    . COLON SURGERY    . COLONOSCOPY WITH PROPOFOL N/A 04/18/2017   Procedure: COLONOSCOPY WITH PROPOFOL;  Surgeon: Manya Silvas, MD;  Location: [facility] ENDOSCOPY;  Service: Endoscopy;  Laterality: N/A;  . ESOPHAGOGASTRODUODENOSCOPY    . IR GENERIC HISTORICAL  01/21/2016   IR RADIOLOGIST EVAL & MGMT 01/21/2016 Aletta Edouard, MD [facility] INTERV RAD  . IR GENERIC HISTORICAL  10/22/2014   IR RADIOLOGIST EVAL & MGMT 10/22/2014 Aletta Edouard, MD [facility] INTERV RAD  . IR RADIOLOGIST EVAL & MGMT  02/02/2017  . IR RADIOLOGIST EVAL & MGMT  03/08/2018  . percutaneous biopsy      Medical History: Past Medical History:  Diagnosis Date  . Alcoholism ([place])   . COPD (chronic obstructive pulmonary disease) (HCC)    SEVERE COPD -OCCASIONALLY USES OXYGEN AT NIGHT-DOES NOT USE INHALERS ON REGULAR BASIS  . Elevated cholesterol   . GERD (gastroesophageal  reflux disease)   . HOH (hard of hearing)   . Hypertension   . Lung cancer ([place])    LEFT UPPER LUNG CARCINOMA-NOT A CANDIDATE FOR SURGICAL RESECTION BECAUSE OF HIS COPD AND POOR CANDIDATE FOR RADIATION BECAUSE OF TRANSPORTATION PROBLEMS  . Seizures ([place])    CHRONIC DILATIN - PT STATES NO SEIZURES IN PAST COUPLE OF YEARS; PT STATES HIS SEIZURES CAUSED NUMBNESS OF ARMS AND HANDS AND NOT ABLE TO MOVE HIS ARMS  . Stroke ([place])    2 TO 3 YRS AGO-AFFECTED HIS SPEECH--ABLE TO AMBULATE WITHOUT ASSIST AND DOES  YARD WORK.  DECREASED HEARING IN BOTH EARS SINCE STROKE.  Marland Kitchen Vitamin D deficiency     Family History: Family History  Problem Relation Age of Onset  . Colon polyps Sister     Social History: Social History   Socioeconomic History  . Marital status: Single    Spouse name: Not on file  . Number of children: Not on file  . Years of education: Not on file  . Highest education level: Not on file  Occupational History  . Not on file  Social Needs  . Financial resource strain: Not on file  . Food insecurity    Worry: Not on file    Inability: Not on file  . Transportation needs    Medical: Not on file    Non-medical: Not on file  Tobacco Use  . Smoking status: Former Smoker    Packs/day: 1.00    Years: 35.00    Pack years: 35.00    Types: Cigarettes    Quit date: 12/22/2011    Years since quitting: 7.3  . Smokeless tobacco: Never Used  Substance and Sexual Activity  . Alcohol use: Not Currently    Alcohol/week: 14.0 standard drinks    Types: 14 Cans of beer per week    Comment:    . Drug use: No  . Sexual activity: Not on file  Lifestyle  . Physical activity    Days per week: Not on file    Minutes per session: Not on file  . Stress: Not on file  Relationships  . Social Herbalist on phone: Not on file    Gets together: Not on file    Attends religious service: Not on file    Active member of club or organization: Not on file    Attends meetings of clubs or organizations: Not on file    Relationship status: Not on file  . Intimate partner violence    Fear of current or ex partner: Not on file    Emotionally abused: Not on file    Physically abused: Not on file    Forced sexual activity: Not on file  Other Topics Concern  . Not on file  Social History Narrative  . Not on file    Vital Signs: Blood pressure 136/88, pulse 64, resp. rate 16, height 5\' 7"  (1.702 m), weight 180 lb (81.6 kg), SpO2 95 %.  Examination: General Appearance: The patient is  well-developed, well-nourished, and in no distress. Skin: Gross inspection of skin unremarkable. Head: normocephalic, no gross deformities. Eyes: no gross deformities noted. ENT: ears appear grossly normal no exudates. Neck: Supple. No thyromegaly. No LAD. Respiratory: clear bilaterally. Cardiovascular: Normal S1 and S2 without murmur or rub. Extremities: No cyanosis. pulses are equal. Neurologic: Alert and oriented. No involuntary movements.  LABS: Recent Results (from the past 2160 hour(s))  Phenytoin level, total     Status:  Abnormal   Collection Time: 02/26/19  9:18 AM  Result Value Ref Range   Phenytoin (Dilantin), Serum 3.3 (L) 10.0 - 20.0 ug/mL    Comment:                                 Detection Limit =  0.8                           <0.8 Indicates None Detected     Radiology: Ir Radiologist Eval & Mgmt  Result Date: 03/08/2018 Please refer to notes tab for details about interventional procedure. (Op Note)   No results found.  No results found.    Assessment and Plan: Patient Active Problem List   Diagnosis Date Noted  . Need for vaccination against Streptococcus pneumoniae using pneumococcal conjugate vaccine 13 03/05/2019  . GERD (gastroesophageal reflux disease) 02/08/2018  . Obstructive chronic bronchitis without exacerbation ([place]) 02/08/2018  . Encounter for general adult medical examination with abnormal findings 12/18/2017  . Atherosclerosis of aorta ([place]) 12/18/2017  . Dysuria 12/18/2017  . CVA (cerebral vascular accident) ([place]) 11/01/2017  . HOH (hard of hearing) 11/01/2017  . HTN (hypertension) 11/01/2017  . Hyperlipidemia, unspecified 11/01/2017  . Hx of adenomatous polyp of colon 12/16/2016  . Squamous cell lung cancer ([place])   . Lung cancer, 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[place]) 05/07/2012    1. Obstructive chronic bronchitis without exacerbation (HCC) Stable, continue to use inhalers as discussed.   2. Essential hypertension Stable, continue present  management.   3. Gastroesophageal reflux disease without esophagitis Stable, continue to Protonix as directed.   4. SOB (shortness of breath) - Spirometry with Graph  General Counseling: I have discussed the findings of the evaluation and examination with Jeneen Rinks.  I have also discussed any further diagnostic evaluation thatmay be needed or ordered today. Josejulian verbalizes understanding of the findings of todays visit. We also reviewed his medications today and discussed drug interactions and side effects including but not limited excessive drowsiness and altered mental states. We also discussed that there is always a risk not just to him but also people around him. he has been encouraged to call the office with any questions or concerns that should arise related to todays visit.    Time spent: 15 This patient was seen by Orson Gear AGNP-C in Collaboration with Dr. Devona Konig as a part of collaborative care agreement.   I have personally obtained a history, examined the patient, evaluated laboratory and imaging results, formulated the assessment and plan and placed orders.    Allyne Gee, MD [facility] Pulmonary and Critical Care Sleep medicine

## 2019-04-24 DIAGNOSIS — J449 Chronic obstructive pulmonary disease, unspecified: Secondary | ICD-10-CM | POA: Diagnosis not present

## 2019-05-22 ENCOUNTER — Other Ambulatory Visit: Payer: Self-pay | Admitting: Adult Health

## 2019-05-22 DIAGNOSIS — I700 Atherosclerosis of aorta: Secondary | ICD-10-CM

## 2019-05-22 DIAGNOSIS — I10 Essential (primary) hypertension: Secondary | ICD-10-CM

## 2019-05-22 DIAGNOSIS — E785 Hyperlipidemia, unspecified: Secondary | ICD-10-CM

## 2019-05-24 DIAGNOSIS — J449 Chronic obstructive pulmonary disease, unspecified: Secondary | ICD-10-CM | POA: Diagnosis not present

## 2019-06-24 DIAGNOSIS — J449 Chronic obstructive pulmonary disease, unspecified: Secondary | ICD-10-CM | POA: Diagnosis not present

## 2019-06-28 ENCOUNTER — Other Ambulatory Visit: Payer: Self-pay

## 2019-06-28 DIAGNOSIS — K219 Gastro-esophageal reflux disease without esophagitis: Secondary | ICD-10-CM

## 2019-06-28 MED ORDER — PANTOPRAZOLE SODIUM 40 MG PO TBEC: 40.0000 mg | DELAYED_RELEASE_TABLET | Freq: Every day | ORAL | 2 refills | Status: DC

## 2019-07-02 ENCOUNTER — Other Ambulatory Visit: Payer: Self-pay

## 2019-07-02 ENCOUNTER — Ambulatory Visit (INDEPENDENT_AMBULATORY_CARE_PROVIDER_SITE_OTHER): Payer: Medicare Other | Admitting: Nurse Practitioner

## 2019-07-02 ENCOUNTER — Encounter: Payer: Self-pay | Admitting: Nurse Practitioner

## 2019-07-02 VITALS — BP 146/83 | HR 63 | Temp 97.6°F | Resp 16 | Ht 67.0 in | Wt 195.0 lb

## 2019-07-02 DIAGNOSIS — K219 Gastro-esophageal reflux disease without esophagitis: Secondary | ICD-10-CM

## 2019-07-02 DIAGNOSIS — I700 Atherosclerosis of aorta: Secondary | ICD-10-CM | POA: Diagnosis not present

## 2019-07-02 DIAGNOSIS — R300 Dysuria: Secondary | ICD-10-CM

## 2019-07-02 DIAGNOSIS — I10 Essential (primary) hypertension: Secondary | ICD-10-CM | POA: Diagnosis not present

## 2019-07-02 DIAGNOSIS — E785 Hyperlipidemia, unspecified: Secondary | ICD-10-CM

## 2019-07-02 DIAGNOSIS — R569 Unspecified convulsions: Secondary | ICD-10-CM

## 2019-07-02 DIAGNOSIS — Z0001 Encounter for general adult medical examination with abnormal findings: Secondary | ICD-10-CM | POA: Diagnosis not present

## 2019-07-02 DIAGNOSIS — I679 Cerebrovascular disease, unspecified: Secondary | ICD-10-CM

## 2019-07-02 DIAGNOSIS — Z5181 Encounter for therapeutic drug level monitoring: Secondary | ICD-10-CM

## 2019-07-02 MED ORDER — ATORVASTATIN CALCIUM 80 MG PO TABS: 80.0000 mg | ORAL_TABLET | Freq: Every day | ORAL | 3 refills | Status: DC

## 2019-07-02 MED ORDER — EZETIMIBE 10 MG PO TABS: 10.0000 mg | ORAL_TABLET | Freq: Every day | ORAL | 3 refills | Status: DC

## 2019-07-02 MED ORDER — ENALAPRIL MALEATE 20 MG PO TABS: 20.0000 mg | ORAL_TABLET | Freq: Every day | ORAL | 3 refills | Status: DC

## 2019-07-02 MED ORDER — PANTOPRAZOLE SODIUM 40 MG PO TBEC: 40.0000 mg | DELAYED_RELEASE_TABLET | Freq: Every day | ORAL | 3 refills | Status: DC

## 2019-07-02 MED ORDER — CLOPIDOGREL BISULFATE 75 MG PO TABS: 75.0000 mg | ORAL_TABLET | Freq: Every day | ORAL | 3 refills | Status: DC

## 2019-07-02 MED ORDER — PHENYTOIN SODIUM EXTENDED 100 MG PO CAPS: 400.0000 mg | ORAL_CAPSULE | Freq: Every day | ORAL | 2 refills | Status: DC

## 2019-07-02 NOTE — Progress Notes (Signed)
[facility] [place], [place] [address]  Internal MEDICINE  Office Visit Note  Patient Name: Derek Webb  [id number]  [id number]  Date of Service: 07/20/2019   Pt is here for routine health maintenance examination   Chief Complaint  Patient presents with  . Annual Exam  . Hypertension  . Hyperlipidemia  . Gastroesophageal Reflux  . Medication Refill    all medications     The patient is here for health maintenance exam. Blood pressure is mildly elevated today. States that he has taken blood pressure meds as prescribed. He does have history of cerebrovascular accident. He does not see vein and vascular. Should have carotid doppler for surveillance. He is due to have routine, fasting labs..   Current Medication: Outpatient Encounter Medications as of 07/02/2019  Medication Sig  . aspirin 81 MG tablet Take 81 mg by mouth daily.  Marland Kitchen atorvastatin (LIPITOR) 80 MG tablet Take 1 tablet (80 mg total) by mouth daily.  . budesonide-formoterol (SYMBICORT) 160-4.5 MCG/ACT inhaler Inhale 1 puff into the lungs 2 (two) times daily.  . Cholecalciferol (VITAMIN D) 2000 UNITS CAPS Take by mouth. VITAMIN D 3 2000 IU  ONE SOFTGEL DAILY  --OTC  . clopidogrel (PLAVIX) 75 MG tablet Take 1 tablet (75 mg total) by mouth daily.  . enalapril (VASOTEC) 20 MG tablet Take 1 tablet (20 mg total) by mouth daily.  Marland Kitchen ezetimibe (ZETIA) 10 MG tablet Take 1 tablet (10 mg total) by mouth daily.  . OXYGEN Inhale into the lungs. At night  . pantoprazole (PROTONIX) 40 MG tablet Take 1 tablet (40 mg total) by mouth daily. IN AM  . phenytoin (DILANTIN) 100 MG ER capsule Take 4 capsules (400 mg total) by mouth at bedtime.  . [DISCONTINUED] atorvastatin (LIPITOR) 80 MG tablet Take 1 tablet (80 mg total) by mouth daily.  . [DISCONTINUED] clopidogrel (PLAVIX) 75 MG tablet Take 1 tablet (75 mg total) by mouth daily.  . [DISCONTINUED] enalapril (VASOTEC) 20 MG tablet Take 1 tablet (20 mg total)  by mouth daily.  . [DISCONTINUED] ezetimibe (ZETIA) 10 MG tablet Take 1 tablet (10 mg total) by mouth daily.  . [DISCONTINUED] pantoprazole (PROTONIX) 40 MG tablet Take 1 tablet (40 mg total) by mouth daily. IN AM  . [DISCONTINUED] phenytoin (DILANTIN) 100 MG ER capsule Take 4 capsules (400 mg total) by mouth at bedtime.  . [DISCONTINUED] aspirin 325 MG tablet Take 81 mg by mouth daily. Reported on 01/21/2016  . [DISCONTINUED] ferrous sulfate 325 (65 FE) MG tablet Take 325 mg by mouth daily with breakfast. Reported on 01/21/2016  . [DISCONTINUED] quinapril (ACCUPRIL) 10 MG tablet Take 10 mg by mouth at bedtime.   No facility-administered encounter medications on file as of 07/02/2019.     Surgical History: Past Surgical History:  Procedure Laterality Date  . CAROTID SURGERY 2009 -LEFT    . COLON POLYP EXCISION    . COLON SURGERY    . COLONOSCOPY WITH PROPOFOL N/A 04/18/2017   Procedure: COLONOSCOPY WITH PROPOFOL;  Surgeon: Manya Silvas, MD;  Location: [facility] ENDOSCOPY;  Service: Endoscopy;  Laterality: N/A;  . ESOPHAGOGASTRODUODENOSCOPY    . IR GENERIC HISTORICAL  01/21/2016   IR RADIOLOGIST EVAL & MGMT 01/21/2016 Aletta Edouard, MD [facility] INTERV RAD  . IR GENERIC HISTORICAL  10/22/2014   IR RADIOLOGIST EVAL & MGMT 10/22/2014 Aletta Edouard, MD [facility] INTERV RAD  . IR RADIOLOGIST EVAL & MGMT  02/02/2017  . IR RADIOLOGIST EVAL & MGMT  03/08/2018  .  percutaneous biopsy      Medical History: Past Medical History:  Diagnosis Date  . Alcoholism ([place])   . COPD (chronic obstructive pulmonary disease) (HCC)    SEVERE COPD -OCCASIONALLY USES OXYGEN AT NIGHT-DOES NOT USE INHALERS ON REGULAR BASIS  . Elevated cholesterol   . GERD (gastroesophageal reflux disease)   . HOH (hard of hearing)   . Hypertension   . Lung cancer ([place])    LEFT UPPER LUNG CARCINOMA-NOT A CANDIDATE FOR SURGICAL RESECTION BECAUSE OF HIS COPD AND POOR CANDIDATE FOR RADIATION BECAUSE OF TRANSPORTATION PROBLEMS  . Seizures  ([place])    CHRONIC DILATIN - PT STATES NO SEIZURES IN PAST COUPLE OF YEARS; PT STATES HIS SEIZURES CAUSED NUMBNESS OF ARMS AND HANDS AND NOT ABLE TO MOVE HIS ARMS  . Stroke ([place])    2 TO 3 YRS AGO-AFFECTED HIS SPEECH--ABLE TO AMBULATE WITHOUT ASSIST AND DOES YARD WORK.  DECREASED HEARING IN BOTH EARS SINCE STROKE.  Marland Kitchen Vitamin D deficiency     Family History: Family History  Problem Relation Age of Onset  . Colon polyps Sister       Review of Systems  Constitutional: Negative for activity change, chills, fatigue and unexpected weight change.  HENT: Negative for congestion, postnasal drip, rhinorrhea, sneezing, sore throat and voice change.   Respiratory: Negative for cough, chest tightness, shortness of breath and wheezing.   Cardiovascular: Negative for chest pain and palpitations.  Gastrointestinal: Negative for abdominal distention, abdominal pain, constipation, diarrhea, nausea and vomiting.  Endocrine: Negative for cold intolerance, heat intolerance, polydipsia and polyuria.  Genitourinary: Negative for flank pain, frequency, hematuria, testicular pain and urgency.  Musculoskeletal: Negative for arthralgias, back pain, joint swelling, myalgias and neck pain.  Skin: Negative for rash.  Allergic/Immunologic: Negative for environmental allergies.  Neurological: Positive for headaches. Negative for dizziness, tremors and numbness.  Hematological: Negative for adenopathy. Does not bruise/bleed easily.  Psychiatric/Behavioral: Negative for behavioral problems (Depression), sleep disturbance and suicidal ideas. The patient is not nervous/anxious.      Today's Vitals   07/02/19 1351  BP: (!) 146/83  Pulse: 63  Resp: 16  Temp: 97.6 F (36.4 C)  SpO2: 95%  Weight: 195 lb (88.5 kg)  Height: 5\' 7"  (1.702 m)   Body mass index is 30.54 kg/m.  Physical Exam Vitals signs and nursing note reviewed.  Constitutional:      General: He is not in acute distress.    Appearance: Normal  appearance. He is well-developed. He is not diaphoretic.  HENT:     Head: Normocephalic and atraumatic.     Nose: Nose normal.     Mouth/Throat:     Pharynx: No oropharyngeal exudate.  Eyes:     Extraocular Movements: Extraocular movements intact.     Pupils: Pupils are equal, round, and reactive to light.  Neck:     Musculoskeletal: Normal range of motion and neck supple.     Thyroid: No thyromegaly.     Vascular: No carotid bruit or JVD.     Trachea: No tracheal deviation.  Cardiovascular:     Rate and Rhythm: Normal rate and regular rhythm.     Pulses: Normal pulses.     Heart sounds: Normal heart sounds. No murmur. No friction rub. No gallop.   Pulmonary:     Effort: Pulmonary effort is normal. No respiratory distress.     Breath sounds: Wheezing present. No rales.     Comments: Mild wheezing present throughout the lung fields. They clear with cough.  Chest:     Chest wall: No tenderness.  Abdominal:     General: Bowel sounds are normal.     Palpations: Abdomen is soft.     Tenderness: There is no abdominal tenderness.  Musculoskeletal: Normal range of motion.  Lymphadenopathy:     Cervical: No cervical adenopathy.  Skin:    General: Skin is warm and dry.  Neurological:     Mental Status: He is alert and oriented to person, place, and time. Mental status is at baseline.     Cranial Nerves: No cranial nerve deficit.  Psychiatric:        Mood and Affect: Mood normal.        Behavior: Behavior normal.        Thought Content: Thought content normal.        Judgment: Judgment normal.    Depression screen [facility] 2/9 07/02/2019 03/01/2019 11/25/2017 11/01/2017  Decreased Interest 0 0 0 0  Down, Depressed, Hopeless 0 0 0 0  PHQ - 2 Score 0 0 0 0    Functional Status Survey: Is the patient deaf or have difficulty hearing?: Yes Does the patient have difficulty seeing, even when wearing glasses/contacts?: No Does the patient have difficulty concentrating, remembering, or making  decisions?: No Does the patient have difficulty walking or climbing stairs?: No Does the patient have difficulty dressing or bathing?: No Does the patient have difficulty doing errands alone such as visiting a doctor's office or shopping?: Yes  MMSE - Mini Mental State Exam 07/20/2019  Not completed: Unable to complete    Fall Risk  07/02/2019 03/01/2019 11/25/2017 11/01/2017  Falls in the past year? 0 0 No No     Assessment/Plan: 1. Encounter for general adult medical examination with abnormal findings Annual health maintenance exam today.   2. Cerebrovascular disease Will get updated carotid doppler study for further evaluation.  - US Carotid Duplex Bilateral; Future  3. Essential hypertension Continue bp medication as prescribed  - enalapril (VASOTEC) 20 MG tablet; Take 1 tablet (20 mg total) by mouth daily.  Dispense: 90 tablet; Refill: 3 - ezetimibe (ZETIA) 10 MG tablet; Take 1 tablet (10 mg total) by mouth daily.  Dispense: 90 tablet; Refill: 3  4. Atherosclerosis of aorta (HCC) Continue plavix daily. Will get updated carotid doppler study for further evaluation. - clopidogrel (PLAVIX) 75 MG tablet; Take 1 tablet (75 mg total) by mouth daily.  Dispense: 90 tablet; Refill: 3 - US Carotid Duplex Bilateral; Future  5. Gastroesophageal reflux disease without esophagitis - pantoprazole (PROTONIX) 40 MG tablet; Take 1 tablet (40 mg total) by mouth daily. IN AM  Dispense: 90 tablet; Refill: 3  6. Seizures (HCC) - phenytoin (DILANTIN) 100 MG ER capsule; Take 4 capsules (400 mg total) by mouth at bedtime.  Dispense: 360 capsule; Refill: 2  7. Hyperlipidemia, unspecified hyperlipidemia type Check fasting lipid panel. Adjust medication as prescribed  - atorvastatin (LIPITOR) 80 MG tablet; Take 1 tablet (80 mg total) by mouth daily.  Dispense: 90 tablet; Refill: 3 - ezetimibe (ZETIA) 10 MG tablet; Take 1 tablet (10 mg total) by mouth daily.  Dispense: 90 tablet; Refill: 3  8. Encounter  for therapeutic drug level monitoring Check dilantin levels with routine labs.   9. Dysuria - UA/M w/rflx Culture, Routine  General Counseling: Derek Webb verbalizes understanding of the findings of todays visit and agrees with plan of treatment. I have discussed any further diagnostic evaluation that may be needed or ordered today. We also reviewed his medications  today. he has been encouraged to call the office with any questions or concerns that should arise related to todays visit.    Counseling:  Cardiac risk factor modification:  1. Control blood pressure. 2. Exercise as prescribed. 3. Follow low sodium, low fat diet. and low fat and low cholestrol diet. 4. Take ASA 81mg  once a day. 5. Restricted calories diet to lose weight.  This patient was seen by Leretha Pol FNP Collaboration with Dr Lavera Guise as a part of collaborative care agreement  Orders Placed This Encounter  Procedures  . US Carotid Duplex Bilateral  . UA/M w/rflx Culture, Routine    Meds ordered this encounter  Medications  . atorvastatin (LIPITOR) 80 MG tablet    Sig: Take 1 tablet (80 mg total) by mouth daily.    Dispense:  90 tablet    Refill:  3    Order Specific Question:   Supervising Provider    Answer:   Lavera Guise [[id number]]  . clopidogrel (PLAVIX) 75 MG tablet    Sig: Take 1 tablet (75 mg total) by mouth daily.    Dispense:  90 tablet    Refill:  3    Order Specific Question:   Supervising Provider    Answer:   Lavera Guise [[id number]]  . enalapril (VASOTEC) 20 MG tablet    Sig: Take 1 tablet (20 mg total) by mouth daily.    Dispense:  90 tablet    Refill:  3    Order Specific Question:   Supervising Provider    Answer:   Lavera Guise [[id number]]  . ezetimibe (ZETIA) 10 MG tablet    Sig: Take 1 tablet (10 mg total) by mouth daily.    Dispense:  90 tablet    Refill:  3    Order Specific Question:   Supervising Provider    Answer:   Lavera Guise [[id number]]  . pantoprazole (PROTONIX) 40 MG tablet     Sig: Take 1 tablet (40 mg total) by mouth daily. IN AM    Dispense:  90 tablet    Refill:  3    Order Specific Question:   Supervising Provider    Answer:   Lavera Guise [[id number]]  . phenytoin (DILANTIN) 100 MG ER capsule    Sig: Take 4 capsules (400 mg total) by mouth at bedtime.    Dispense:  360 capsule    Refill:  2    Order Specific Question:   Supervising Provider    Answer:   Lavera Guise [[id number]]    Time spent: [place], MD  Internal Medicine

## 2019-07-20 DIAGNOSIS — Z5112 Encounter for antineoplastic immunotherapy: Secondary | ICD-10-CM | POA: Insufficient documentation

## 2019-07-20 DIAGNOSIS — Z5181 Encounter for therapeutic drug level monitoring: Secondary | ICD-10-CM | POA: Insufficient documentation

## 2019-07-20 DIAGNOSIS — R569 Unspecified convulsions: Secondary | ICD-10-CM | POA: Insufficient documentation

## 2019-07-24 DIAGNOSIS — J449 Chronic obstructive pulmonary disease, unspecified: Secondary | ICD-10-CM | POA: Diagnosis not present

## 2019-07-26 ENCOUNTER — Ambulatory Visit: Payer: Medicare Other | Admitting: Nurse Practitioner

## 2019-08-06 ENCOUNTER — Other Ambulatory Visit: Payer: Self-pay | Admitting: Nurse Practitioner

## 2019-08-06 DIAGNOSIS — Z5181 Encounter for therapeutic drug level monitoring: Secondary | ICD-10-CM | POA: Diagnosis not present

## 2019-08-06 DIAGNOSIS — I10 Essential (primary) hypertension: Secondary | ICD-10-CM | POA: Diagnosis not present

## 2019-08-06 DIAGNOSIS — E782 Mixed hyperlipidemia: Secondary | ICD-10-CM | POA: Diagnosis not present

## 2019-08-06 DIAGNOSIS — Z0001 Encounter for general adult medical examination with abnormal findings: Secondary | ICD-10-CM | POA: Diagnosis not present

## 2019-08-07 LAB — COMPREHENSIVE METABOLIC PANEL
ALT: 38 IU/L (ref 0–44)
AST: 23 IU/L (ref 0–40)
Albumin/Globulin Ratio: 1.3 (ref 1.2–2.2)
Albumin: 4.1 g/dL (ref 3.8–4.8)
Alkaline Phosphatase: 135 IU/L — ABNORMAL HIGH (ref 39–117)
BUN/Creatinine Ratio: 11 (ref 10–24)
BUN: 8 mg/dL (ref 8–27)
Bilirubin Total: <0.2 mg/dL (ref 0.0–1.2)
CO2: 27 mmol/L (ref 20–29)
Calcium: 9.7 mg/dL (ref 8.6–10.2)
Chloride: 101 mmol/L (ref 96–106)
Creatinine, Ser: 0.7 mg/dL — ABNORMAL LOW (ref 0.76–1.27)
GFR calc Af Amer: 114 mL/min/{1.73_m2} (ref >59)
GFR calc non Af Amer: 98 mL/min/{1.73_m2} (ref >59)
Globulin, Total: 3.2 g/dL (ref 1.5–4.5)
Glucose: 94 mg/dL (ref 65–99)
Potassium: 4.5 mmol/L (ref 3.5–5.2)
Sodium: 140 mmol/L (ref 134–144)
Total Protein: 7.3 g/dL (ref 6.0–8.5)

## 2019-08-07 LAB — LIPID PANEL W/O CHOL/HDL RATIO
Cholesterol, Total: 161 mg/dL (ref 100–199)
HDL: 70 mg/dL (ref >39)
LDL Chol Calc (NIH): 77 mg/dL (ref 0–99)
Triglycerides: 70 mg/dL (ref 0–149)
VLDL Cholesterol Cal: 14 mg/dL (ref 5–40)

## 2019-08-07 LAB — CBC
Hematocrit: 42.2 % (ref 37.5–51.0)
Hemoglobin: 14.3 g/dL (ref 13.0–17.7)
MCH: 30.7 pg (ref 26.6–33.0)
MCHC: 33.9 g/dL (ref 31.5–35.7)
MCV: 91 fL (ref 79–97)
Platelets: 347 10*3/uL (ref 150–450)
RBC: 4.66 x10E6/uL (ref 4.14–5.80)
RDW: 13.1 % (ref 11.6–15.4)
WBC: 5.1 10*3/uL (ref 3.4–10.8)

## 2019-08-07 LAB — TSH: TSH: 1.99 u[IU]/mL (ref 0.450–4.500)

## 2019-08-07 LAB — HEPATITIS C ANTIBODY (REFLEX): HCV Ab: <0.1 s/co ratio (ref 0.0–0.9)

## 2019-08-07 LAB — PHENYTOIN LEVEL, TOTAL: Phenytoin (Dilantin), Serum: 9.2 ug/mL — ABNORMAL LOW (ref 10.0–20.0)

## 2019-08-07 LAB — PSA: Prostate Specific Ag, Serum: 2.5 ng/mL (ref 0.0–4.0)

## 2019-08-07 LAB — HCV COMMENT:

## 2019-08-07 LAB — T4, FREE: Free T4: 0.93 ng/dL (ref 0.82–1.77)

## 2019-08-08 ENCOUNTER — Telehealth: Payer: Self-pay

## 2019-08-08 NOTE — Telephone Encounter (Signed)
Confirmed appointment with patient. klh °

## 2019-08-10 ENCOUNTER — Other Ambulatory Visit: Payer: Self-pay

## 2019-08-10 ENCOUNTER — Ambulatory Visit (INDEPENDENT_AMBULATORY_CARE_PROVIDER_SITE_OTHER): Payer: Medicare Other

## 2019-08-10 DIAGNOSIS — I700 Atherosclerosis of aorta: Secondary | ICD-10-CM | POA: Diagnosis not present

## 2019-08-10 DIAGNOSIS — I679 Cerebrovascular disease, unspecified: Secondary | ICD-10-CM | POA: Diagnosis not present

## 2019-08-15 NOTE — Progress Notes (Signed)
I see him back 08/20/2019. Should I increase his dilantin?

## 2019-08-16 ENCOUNTER — Telehealth: Payer: Self-pay

## 2019-08-16 NOTE — Telephone Encounter (Signed)
CONFIRMED 08-20-19 OV AS VIRTUAL.

## 2019-08-20 ENCOUNTER — Ambulatory Visit (INDEPENDENT_AMBULATORY_CARE_PROVIDER_SITE_OTHER): Payer: Medicare Other | Admitting: Nurse Practitioner

## 2019-08-20 ENCOUNTER — Other Ambulatory Visit: Payer: Self-pay

## 2019-08-20 ENCOUNTER — Encounter: Payer: Self-pay | Admitting: Nurse Practitioner

## 2019-08-20 VITALS — Ht 67.0 in

## 2019-08-20 DIAGNOSIS — I700 Atherosclerosis of aorta: Secondary | ICD-10-CM

## 2019-08-20 DIAGNOSIS — R569 Unspecified convulsions: Secondary | ICD-10-CM | POA: Diagnosis not present

## 2019-08-20 DIAGNOSIS — I10 Essential (primary) hypertension: Secondary | ICD-10-CM | POA: Diagnosis not present

## 2019-08-20 DIAGNOSIS — K219 Gastro-esophageal reflux disease without esophagitis: Secondary | ICD-10-CM | POA: Diagnosis not present

## 2019-08-20 DIAGNOSIS — E785 Hyperlipidemia, unspecified: Secondary | ICD-10-CM

## 2019-08-20 MED ORDER — ATORVASTATIN CALCIUM 80 MG PO TABS: 80.0000 mg | ORAL_TABLET | Freq: Every day | ORAL | 3 refills | Status: DC

## 2019-08-20 MED ORDER — EZETIMIBE 10 MG PO TABS: 10.0000 mg | ORAL_TABLET | Freq: Every day | ORAL | 3 refills | Status: DC

## 2019-08-20 MED ORDER — PANTOPRAZOLE SODIUM 40 MG PO TBEC: 40.0000 mg | DELAYED_RELEASE_TABLET | Freq: Every day | ORAL | 3 refills | Status: DC

## 2019-08-20 MED ORDER — PHENYTOIN SODIUM EXTENDED 100 MG PO CAPS: 400.0000 mg | ORAL_CAPSULE | Freq: Every day | ORAL | 1 refills | Status: DC

## 2019-08-20 NOTE — Progress Notes (Signed)
[facility] [place], [place] [address]  Internal MEDICINE  Telephone Visit  Patient Name: Derek Webb  [id number]  [id number]  Date of Service: 08/20/2019  I connected with the patient at 1:48pm by telephone and verified the patients identity using two identifiers.   I discussed the limitations, risks, security and privacy concerns of performing an evaluation and management service by telephone and the availability of in person appointments. I also discussed with the patient that there may be a patient responsible charge related to the service.  The patient expressed understanding and agrees to proceed.    Chief Complaint  Patient presents with  . Telephone Assessment  . Telephone Screen  . Follow-up    review ultrasound     The patient has been contacted via telephone for follow up visit due to concerns for spread of novel coronavirus. The patient presents for follow up. He has had carotid doppler since his last visit. Does show mild plaque with <50% stenosis, bilaterally. He currently takes atorvastatin, Zetia, and plavix. He had labs done prior to  This visit. Dilantin level still very slightly low at 9.2. much improved from prior check which was 3.3. he has no new concerns or complaints today.       Current Medication: Outpatient Encounter Medications as of 08/20/2019  Medication Sig  . aspirin 81 MG tablet Take 81 mg by mouth daily.  Marland Kitchen atorvastatin (LIPITOR) 80 MG tablet Take 1 tablet (80 mg total) by mouth daily.  . budesonide-formoterol (SYMBICORT) 160-4.5 MCG/ACT inhaler Inhale 1 puff into the lungs 2 (two) times daily.  . Cholecalciferol (VITAMIN D) 2000 UNITS CAPS Take by mouth. VITAMIN D 3 2000 IU  ONE SOFTGEL DAILY  --OTC  . clopidogrel (PLAVIX) 75 MG tablet Take 1 tablet (75 mg total) by mouth daily.  . enalapril (VASOTEC) 20 MG tablet Take 1 tablet (20 mg total) by mouth daily.  Marland Kitchen ezetimibe (ZETIA) 10 MG tablet Take 1 tablet (10 mg  total) by mouth daily.  . OXYGEN Inhale into the lungs. At night  . pantoprazole (PROTONIX) 40 MG tablet Take 1 tablet (40 mg total) by mouth daily. IN AM  . phenytoin (DILANTIN) 100 MG ER capsule Take 4 capsules (400 mg total) by mouth at bedtime.  . [DISCONTINUED] atorvastatin (LIPITOR) 80 MG tablet Take 1 tablet (80 mg total) by mouth daily.  . [DISCONTINUED] ezetimibe (ZETIA) 10 MG tablet Take 1 tablet (10 mg total) by mouth daily.  . [DISCONTINUED] pantoprazole (PROTONIX) 40 MG tablet Take 1 tablet (40 mg total) by mouth daily. IN AM  . [DISCONTINUED] phenytoin (DILANTIN) 100 MG ER capsule Take 4 capsules (400 mg total) by mouth at bedtime.   No facility-administered encounter medications on file as of 08/20/2019.    Surgical History: Past Surgical History:  Procedure Laterality Date  . CAROTID SURGERY 2009 -LEFT    . COLON POLYP EXCISION    . COLON SURGERY    . COLONOSCOPY WITH PROPOFOL N/A 04/18/2017   Procedure: COLONOSCOPY WITH PROPOFOL;  Surgeon: Manya Silvas, MD;  Location: [facility] ENDOSCOPY;  Service: Endoscopy;  Laterality: N/A;  . ESOPHAGOGASTRODUODENOSCOPY    . IR GENERIC HISTORICAL  01/21/2016   IR RADIOLOGIST EVAL & MGMT 01/21/2016 Aletta Edouard, MD [facility] INTERV RAD  . IR GENERIC HISTORICAL  10/22/2014   IR RADIOLOGIST EVAL & MGMT 10/22/2014 Aletta Edouard, MD [facility] INTERV RAD  . IR RADIOLOGIST EVAL & MGMT  02/02/2017  . IR RADIOLOGIST EVAL & MGMT  03/08/2018  . percutaneous biopsy      Medical History: Past Medical History:  Diagnosis Date  . Alcoholism ([place])   . COPD (chronic obstructive pulmonary disease) (H[facility])    SEVERE COPD -OCCASIONALLY USES OXYGEN AT NIGHT-DOES NOT USE INHALERS ON REGULAR BASIS  . Elevated cholesterol   . GERD (gastroesophageal reflux disease)   . HOH (hard of hearing)   . Hypertension   . Lung cancer ([place])    LEFT UPPER LUNG CARCINOMA-NOT A CANDIDATE FOR SURGICAL RESECTION BECAUSE OF HIS COPD AND POOR CANDIDATE FOR RADIATION BECAUSE OF  TRANSPORTATION PROBLEMS  . Seizures ([place])    CHRONIC DILATIN - PT STATES NO SEIZURES IN PAST COUPLE OF YEARS; PT STATES HIS SEIZURES CAUSED NUMBNESS OF ARMS AND HANDS AND NOT ABLE TO MOVE HIS ARMS  . Stroke ([place])    2 TO 3 YRS AGO-AFFECTED HIS SPEECH--ABLE TO AMBULATE WITHOUT ASSIST AND DOES YARD WORK.  DECREASED HEARING IN BOTH EARS SINCE STROKE.  Marland Kitchen Vitamin D deficiency     Family History: Family History  Problem Relation Age of Onset  . Colon polyps Sister     Social History   Socioeconomic History  . Marital status: Single    Spouse name: Not on file  . Number of children: Not on file  . Years of education: Not on file  . Highest education level: Not on file  Occupational History  . Not on file  Tobacco Use  . Smoking status: Former Smoker    Packs/day: 1.00    Years: 35.00    Pack years: 35.00    Types: Cigarettes    Quit date: 12/22/2011    Years since quitting: 7.6  . Smokeless tobacco: Never Used  Substance and Sexual Activity  . Alcohol use: Yes    Alcohol/week: 14.0 standard drinks    Types: 14 Cans of beer per week    Comment:    . Drug use: No  . Sexual activity: Not on file  Other Topics Concern  . Not on file  Social History Narrative  . Not on file   Social Determinants of Health   Financial Resource Strain:   . Difficulty of Paying Living Expenses: Not on file  Food Insecurity:   . Worried About Charity fundraiser in the Last Year: Not on file  . Ran Out of Food in the Last Year: Not on file  Transportation Needs:   . Lack of Transportation (Medical): Not on file  . Lack of Transportation (Non-Medical): Not on file  Physical Activity:   . Days of Exercise per Week: Not on file  . Minutes of Exercise per Session: Not on file  Stress:   . Feeling of Stress : Not on file  Social Connections:   . Frequency of Communication with Friends and Family: Not on file  . Frequency of Social Gatherings with Friends and Family: Not on file  . Attends  Religious Services: Not on file  . Active Member of Clubs or Organizations: Not on file  . Attends Archivist Meetings: Not on file  . Marital Status: Not on file  Intimate Partner Violence:   . Fear of Current or Ex-Partner: Not on file  . Emotionally Abused: Not on file  . Physically Abused: Not on file  . Sexually Abused: Not on file      Review of Systems  Constitutional: Negative for activity change, chills, fatigue and unexpected weight change.  HENT: Negative for congestion, postnasal drip, rhinorrhea,  sneezing, sore throat and voice change.   Respiratory: Negative for cough, chest tightness, shortness of breath and wheezing.   Cardiovascular: Negative for chest pain and palpitations.  Gastrointestinal: Negative for abdominal distention, abdominal pain, constipation, diarrhea, nausea and vomiting.  Endocrine: Negative for cold intolerance, heat intolerance, polydipsia and polyuria.  Genitourinary: Negative for flank pain, frequency, hematuria, testicular pain and urgency.  Musculoskeletal: Negative for arthralgias, back pain, joint swelling, myalgias and neck pain.  Skin: Negative for rash.  Allergic/Immunologic: Negative for environmental allergies.  Neurological: Positive for headaches. Negative for dizziness, tremors and numbness.  Hematological: Negative for adenopathy. Does not bruise/bleed easily.  Psychiatric/Behavioral: Negative for behavioral problems (Depression), sleep disturbance and suicidal ideas. The patient is not nervous/anxious.     Today's Vitals   08/20/19 1334  Height: 5\' 7"  (1.702 m)   Body mass index is 30.54 kg/m.  Observation/Objective:  The patient is alert and oriented. He is pleasant and answering all questions appropriately. Breathing is non-labored. He is in no acute distress.    Assessment/Plan:  1. Atherosclerosis of aorta ([place]) Reviewed carotid doppler results with the patient. Shows mild plaque with less thatn 50%  stenosis, bilaterally. Continue to monitor yearly.   2. Essential hypertension Stable. Continue bp medication as prescribed   3. Hyperlipidemia, unspecified hyperlipidemia type Reviewed cholesterol panel. Continue zetia and lipitor as prescribed  - ezetimibe (ZETIA) 10 MG tablet; Take 1 tablet (10 mg total) by mouth daily.  Dispense: 90 tablet; Refill: 3 - atorvastatin (LIPITOR) 80 MG tablet; Take 1 tablet (80 mg total) by mouth daily.  Dispense: 90 tablet; Refill: 3  4. Gastroesophageal reflux disease without esophagitis - pantoprazole (PROTONIX) 40 MG tablet; Take 1 tablet (40 mg total) by mouth daily. IN AM  Dispense: 90 tablet; Refill: 3  5. Seizures ([place]) Dilantin level near normal. Patient has remained seizure free. Recheck levels in 6 months and adjust dosing as indicated.  - phenytoin (DILANTIN) 100 MG ER capsule; Take 4 capsules (400 mg total) by mouth at bedtime.  Dispense: 450 capsule; Refill: 1   General Counseling: Kiandre verbalizes understanding of the findings of today's phone visit and agrees with plan of treatment. I have discussed any further diagnostic evaluation that may be needed or ordered today. We also reviewed his medications today. he has been encouraged to call the office with any questions or concerns that should arise related to todays visit.  This patient was seen by [place] with Dr Lavera Guise as a part of collaborative care agreement  Meds ordered this encounter  Medications  . ezetimibe (ZETIA) 10 MG tablet    Sig: Take 1 tablet (10 mg total) by mouth daily.    Dispense:  90 tablet    Refill:  3    Order Specific Question:   Supervising Provider    Answer:   Lavera Guise [[id number]]  . phenytoin (DILANTIN) 100 MG ER capsule    Sig: Take 4 capsules (400 mg total) by mouth at bedtime.    Dispense:  450 capsule    Refill:  1    Order Specific Question:   Supervising Provider    Answer:   Lavera Guise [[id number]]  . atorvastatin  (LIPITOR) 80 MG tablet    Sig: Take 1 tablet (80 mg total) by mouth daily.    Dispense:  90 tablet    Refill:  3    Order Specific Question:   Supervising Provider    Answer:  KHAN, FOZIA M [[id number]]  . pantoprazole (PROTONIX) 40 MG tablet    Sig: Take 1 tablet (40 mg total) by mouth daily. IN AM    Dispense:  90 tablet    Refill:  3    Order Specific Question:   Supervising Provider    Answer:   Lavera Guise [[id number]]    Time spent: 21 Minutes    Dr Lavera Guise Internal medicine

## 2019-08-20 NOTE — Progress Notes (Signed)
He is asymptomatic. He has been on this medication for years, prophylactic treatment after suffering strke many years ago.

## 2019-08-24 DIAGNOSIS — J449 Chronic obstructive pulmonary disease, unspecified: Secondary | ICD-10-CM | POA: Diagnosis not present

## 2019-09-05 NOTE — Procedures (Signed)
[place], [place] [address]  DATE OF SERVICE: August 10, 2019  CAROTID DOPPLER INTERPRETATION:  Bilateral Carotid Ultrsasound and Color Doppler Examination was performed. The RIGHT CCA shows mild mixed plaque in the vessel. The LEFT CCA shows mild soft plaque in the vessel. There was no significant intimal thickening noted in the RIGHT carotid artery. There was no significant intimal thickening in the LEFT carotid artery.  The RIGHT CCA shows peak systolic velocity of 22.4 cm per second. The end diastolic velocity is 82.5 cm per second on the RIGHT side. The RIGHT ICA shows peak systolic velocity of 00.3 per second. RIGHT sided ICA end diastolic velocity is 13 cm per second. The RIGHT ECA shows a peak systolic velocity of 70.4 cm per second. The ICA/CCA ratio is calculated to be 0.9. This suggests less than 50% stenosis. The Vertebral Artery shows antegrade flow.  The LEFT CCA shows peak systolic velocity of 88.8 cm per second. The end diastolic velocity is 91.6 cm per second on the LEFT side. The LEFT ICA shows peak systolic velocity of 94.5 per second. LEFT sided ICA end diastolic velocity is 13 cm per second. The LEFT ECA shows a peak systolic velocity of 03.8 cm per second. The ICA/CCA ratio is calculated to be 1.0. This suggests less than 50% stenosis. The Vertebral Artery shows antegrade flow.   Impression:    The RIGHT CAROTID shows less than 50% stenosis. The LEFT CAROTID shows less than 50% stenosis.  There is mild plaque formation noted on the LEFT and mild on the RIGHT  side. Consider a repeat Carotid doppler if clinical situation and symptoms warrant in 6-12 months. Patient should be encouraged to change lifestyles such as smoking cessation, regular exercise and dietary modification. Use of statins in the right clinical setting and ASA is encouraged.  Allyne Gee, MD [facility] Pulmonary Critical Care Medicine

## 2019-09-06 NOTE — Progress Notes (Signed)
Mild plaque with less than 50% stenosis bilaterally. Repeat in one year.

## 2019-09-24 DIAGNOSIS — J449 Chronic obstructive pulmonary disease, unspecified: Secondary | ICD-10-CM | POA: Diagnosis not present

## 2019-10-12 ENCOUNTER — Telehealth: Payer: Self-pay

## 2019-10-12 NOTE — Telephone Encounter (Signed)
Confirmed appointment on 10/16/2019 and screened for covid. klh 

## 2019-10-16 ENCOUNTER — Ambulatory Visit (INDEPENDENT_AMBULATORY_CARE_PROVIDER_SITE_OTHER): Payer: Medicare Other | Admitting: Internal Medicine

## 2019-10-16 ENCOUNTER — Encounter: Payer: Self-pay | Admitting: Internal Medicine

## 2019-10-16 ENCOUNTER — Other Ambulatory Visit: Payer: Self-pay

## 2019-10-16 VITALS — BP 140/80 | HR 91 | Temp 97.9°F | Resp 16 | Ht 67.0 in | Wt 200.0 lb

## 2019-10-16 DIAGNOSIS — I10 Essential (primary) hypertension: Secondary | ICD-10-CM | POA: Diagnosis not present

## 2019-10-16 DIAGNOSIS — J449 Chronic obstructive pulmonary disease, unspecified: Secondary | ICD-10-CM | POA: Diagnosis not present

## 2019-10-16 DIAGNOSIS — K219 Gastro-esophageal reflux disease without esophagitis: Secondary | ICD-10-CM | POA: Diagnosis not present

## 2019-10-16 DIAGNOSIS — R0602 Shortness of breath: Secondary | ICD-10-CM | POA: Diagnosis not present

## 2019-10-16 DIAGNOSIS — J0100 Acute maxillary sinusitis, unspecified: Secondary | ICD-10-CM

## 2019-10-16 MED ORDER — AZITHROMYCIN 250 MG PO TABS: ORAL_TABLET | ORAL | 0 refills | Status: DC

## 2019-10-16 MED ORDER — BUDESONIDE-FORMOTEROL FUMARATE 160-4.5 MCG/ACT IN AERO: 1.0000 | INHALATION_SPRAY | Freq: Two times a day (BID) | RESPIRATORY_TRACT | 3 refills | Status: DC

## 2019-10-16 NOTE — Progress Notes (Signed)
[facility] [place], [place] [address]  Pulmonary Sleep Medicine   Office Visit Note  Patient Name: Derek Webb DOB: 13-Jul-1951 MRN [id number]  Date of Service: 10/16/2019  Complaints/HPI: Pt is seen today for pulmonary follow up. Overall he is ok.  He reports he sleeps with oxygen at times, 2 LPM.  He has had some cough and cold like symptoms for a few weeks.  He appears well today in office.  He denies any chest pain or difficulty breathing at this time.  He has not been hospitalized, denies any sob, hemoptysis or other symptoms.  He quit smoking and continues to abstain.   ROS  General: (-) fever, (-) chills, (-) night sweats, (-) weakness Skin: (-) rashes, (-) itching,. Eyes: (-) visual changes, (-) redness, (-) itching. Nose and Sinuses: (-) nasal stuffiness or itchiness, (-) postnasal drip, (-) nosebleeds, (-) sinus trouble. Mouth and Throat: (-) sore throat, (-) hoarseness. Neck: (-) swollen glands, (-) enlarged thyroid, (-) neck pain. Respiratory: - cough, (-) bloody sputum, - shortness of breath, - wheezing. Cardiovascular: - ankle swelling, (-) chest pain. Lymphatic: (-) lymph node enlargement. Neurologic: (-) numbness, (-) tingling. Psychiatric: (-) anxiety, (-) depression   Current Medication: Outpatient Encounter Medications as of 10/16/2019  Medication Sig  . aspirin 81 MG tablet Take 81 mg by mouth daily.  Marland Kitchen atorvastatin (LIPITOR) 80 MG tablet Take 1 tablet (80 mg total) by mouth daily.  . budesonide-formoterol (SYMBICORT) 160-4.5 MCG/ACT inhaler Inhale 1 puff into the lungs 2 (two) times daily.  . Cholecalciferol (VITAMIN D) 2000 UNITS CAPS Take by mouth. VITAMIN D 3 2000 IU  ONE SOFTGEL DAILY  --OTC  . clopidogrel (PLAVIX) 75 MG tablet Take 1 tablet (75 mg total) by mouth daily.  . enalapril (VASOTEC) 20 MG tablet Take 1 tablet (20 mg total) by mouth daily.  Marland Kitchen ezetimibe (ZETIA) 10 MG tablet Take 1 tablet (10 mg total) by mouth  daily.  . OXYGEN Inhale into the lungs. At night  . pantoprazole (PROTONIX) 40 MG tablet Take 1 tablet (40 mg total) by mouth daily. IN AM  . phenytoin (DILANTIN) 100 MG ER capsule Take 4 capsules (400 mg total) by mouth at bedtime.   No facility-administered encounter medications on file as of 10/16/2019.    Surgical History: Past Surgical History:  Procedure Laterality Date  . CAROTID SURGERY 2009 -LEFT    . COLON POLYP EXCISION    . COLON SURGERY    . COLONOSCOPY WITH PROPOFOL N/A 04/18/2017   Procedure: COLONOSCOPY WITH PROPOFOL;  Surgeon: Manya Silvas, MD;  Location: [facility] ENDOSCOPY;  Service: Endoscopy;  Laterality: N/A;  . ESOPHAGOGASTRODUODENOSCOPY    . IR GENERIC HISTORICAL  01/21/2016   IR RADIOLOGIST EVAL & MGMT 01/21/2016 Aletta Edouard, MD [facility] INTERV RAD  . IR GENERIC HISTORICAL  10/22/2014   IR RADIOLOGIST EVAL & MGMT 10/22/2014 Aletta Edouard, MD [facility] INTERV RAD  . IR RADIOLOGIST EVAL & MGMT  02/02/2017  . IR RADIOLOGIST EVAL & MGMT  03/08/2018  . percutaneous biopsy      Medical History: Past Medical History:  Diagnosis Date  . Alcoholism ([place])   . COPD (chronic obstructive pulmonary disease) (HCC)    SEVERE COPD -OCCASIONALLY USES OXYGEN AT NIGHT-DOES NOT USE INHALERS ON REGULAR BASIS  . Elevated cholesterol   . GERD (gastroesophageal reflux disease)   . HOH (hard of hearing)   . Hypertension   . Lung cancer ([place])    LEFT UPPER LUNG  CARCINOMA-NOT A CANDIDATE FOR SURGICAL RESECTION BECAUSE OF HIS COPD AND POOR CANDIDATE FOR RADIATION BECAUSE OF TRANSPORTATION PROBLEMS  . Seizures ([place])    CHRONIC DILATIN - PT STATES NO SEIZURES IN PAST COUPLE OF YEARS; PT STATES HIS SEIZURES CAUSED NUMBNESS OF ARMS AND HANDS AND NOT ABLE TO MOVE HIS ARMS  . Stroke ([place])    2 TO 3 YRS AGO-AFFECTED HIS SPEECH--ABLE TO AMBULATE WITHOUT ASSIST AND DOES YARD WORK.  DECREASED HEARING IN BOTH EARS SINCE STROKE.  Marland Kitchen Vitamin D deficiency     Family History: Family History  Problem  Relation Age of Onset  . Colon polyps Sister     Social History: Social History   Socioeconomic History  . Marital status: Single    Spouse name: Not on file  . Number of children: Not on file  . Years of education: Not on file  . Highest education level: Not on file  Occupational History  . Not on file  Tobacco Use  . Smoking status: Former Smoker    Packs/day: 1.00    Years: 35.00    Pack years: 35.00    Types: Cigarettes    Quit date: 12/22/2011    Years since quitting: 7.8  . Smokeless tobacco: Never Used  Substance and Sexual Activity  . Alcohol use: Yes    Alcohol/week: 14.0 standard drinks    Types: 14 Cans of beer per week    Comment:    . Drug use: No  . Sexual activity: Not on file  Other Topics Concern  . Not on file  Social History Narrative  . Not on file   Social Determinants of Health   Financial Resource Strain:   . Difficulty of Paying Living Expenses: Not on file  Food Insecurity:   . Worried About Charity fundraiser in the Last Year: Not on file  . Ran Out of Food in the Last Year: Not on file  Transportation Needs:   . Lack of Transportation (Medical): Not on file  . Lack of Transportation (Non-Medical): Not on file  Physical Activity:   . Days of Exercise per Week: Not on file  . Minutes of Exercise per Session: Not on file  Stress:   . Feeling of Stress : Not on file  Social Connections:   . Frequency of Communication with Friends and Family: Not on file  . Frequency of Social Gatherings with Friends and Family: Not on file  . Attends Religious Services: Not on file  . Active Member of Clubs or Organizations: Not on file  . Attends Archivist Meetings: Not on file  . Marital Status: Not on file  Intimate Partner Violence:   . Fear of Current or Ex-Partner: Not on file  . Emotionally Abused: Not on file  . Physically Abused: Not on file  . Sexually Abused: Not on file    Vital Signs: Blood pressure 140/80, pulse 91,  temperature 97.9 F (36.6 C), resp. rate 16, height 5\' 7"  (1.702 m), weight 200 lb (90.7 kg), SpO2 95 %.  Examination: General Appearance: The patient is well-developed, well-nourished, and in no distress. Skin: Gross inspection of skin unremarkable. Head: normocephalic, no gross deformities. Eyes: no gross deformities noted. ENT: ears appear grossly normal no exudates. Neck: Supple. No thyromegaly. No LAD. Respiratory: clear bilaterally. Cardiovascular: Normal S1 and S2 without murmur or rub. Extremities: No cyanosis. pulses are equal. Neurologic: Alert and oriented. No involuntary movements.  LABS: Recent Results (from the past 2160 hour(s))  Comprehensive metabolic panel     Status: Abnormal   Collection Time: 08/06/19  8:06 AM  Result Value Ref Range   Glucose 94 65 - 99 mg/dL   BUN 8 8 - 27 mg/dL   Creatinine, Ser 0.70 (L) 0.76 - 1.27 mg/dL   GFR calc non Af Amer 98 >59 mL/min/1.73   GFR calc Af Amer 114 >59 mL/min/1.73   BUN/Creatinine Ratio 11 10 - 24   Sodium 140 134 - 144 mmol/L   Potassium 4.5 3.5 - 5.2 mmol/L   Chloride 101 96 - 106 mmol/L   CO2 27 20 - 29 mmol/L   Calcium 9.7 8.6 - 10.2 mg/dL   Total Protein 7.3 6.0 - 8.5 g/dL   Albumin 4.1 3.8 - 4.8 g/dL   Globulin, Total 3.2 1.5 - 4.5 g/dL   Albumin/Globulin Ratio 1.3 1.2 - 2.2   Bilirubin Total <0.2 0.0 - 1.2 mg/dL   Alkaline Phosphatase 135 (H) 39 - 117 IU/L   AST 23 0 - 40 IU/L   ALT 38 0 - 44 IU/L  CBC     Status: None   Collection Time: 08/06/19  8:06 AM  Result Value Ref Range   WBC 5.1 3.4 - 10.8 x10E3/uL   RBC 4.66 4.14 - 5.80 x10E6/uL   Hemoglobin 14.3 13.0 - 17.7 g/dL   Hematocrit 42.2 37.5 - 51.0 %   MCV 91 79 - 97 fL   MCH 30.7 26.6 - 33.0 pg   MCHC 33.9 31.5 - 35.7 g/dL   RDW 13.1 11.6 - 15.4 %   Platelets 347 150 - 450 x10E3/uL  Lipid Panel w/o Chol/HDL Ratio     Status: None   Collection Time: 08/06/19  8:06 AM  Result Value Ref Range   Cholesterol, Total 161 100 - 199 mg/dL    Triglycerides 70 0 - 149 mg/dL   HDL 70 >39 mg/dL   VLDL Cholesterol Cal 14 5 - 40 mg/dL   LDL Chol Calc (NIH) 77 0 - 99 mg/dL  T4, free     Status: None   Collection Time: 08/06/19  8:06 AM  Result Value Ref Range   Free T4 0.93 0.82 - 1.77 ng/dL  TSH     Status: None   Collection Time: 08/06/19  8:06 AM  Result Value Ref Range   TSH 1.990 0.450 - 4.500 uIU/mL  Phenytoin level, total     Status: Abnormal   Collection Time: 08/06/19  8:06 AM  Result Value Ref Range   Phenytoin (Dilantin), Serum 9.2 (L) 10.0 - 20.0 ug/mL    Comment:                                 Detection Limit =  0.8                           <0.8 Indicates None Detected   PSA     Status: None   Collection Time: 08/06/19  8:06 AM  Result Value Ref Range   Prostate Specific Ag, Serum 2.5 0.0 - 4.0 ng/mL    Comment: Roche ECLIA methodology. According to the American Urological Association, Serum PSA should decrease and remain at undetectable levels after radical prostatectomy. The AUA defines biochemical recurrence as an initial PSA value 0.2 ng/mL or greater followed by a subsequent confirmatory PSA value 0.2 ng/mL or greater. Values obtained with different assay  methods or kits cannot be used interchangeably. Results cannot be interpreted as absolute evidence of the presence or absence of malignant disease.   Hepatitis c antibody (reflex)     Status: None   Collection Time: 08/06/19  8:06 AM  Result Value Ref Range   HCV Ab <0.1 0.0 - 0.9 s/co ratio  HCV Comment:     Status: None   Collection Time: 08/06/19  8:06 AM  Result Value Ref Range   Comment: Comment     Comment: Non reactive HCV antibody screen is consistent with no HCV infection, unless recent infection is suspected or other evidence exists to indicate HCV infection. **Effective September 10, 2019 HCV Ab w/Rflx to Verification **   will be made non-orderable.   [facility] offers order code [phone] HCV Antibody reflex to   NAA.      Radiology: IR Radiologist Eval & Mgmt  Result Date: 03/08/2018 Please refer to notes tab for details about interventional procedure. (Op Note)   No results found.  No results found.    Assessment and Plan: Patient Active Problem List   Diagnosis Date Noted  . Seizures ([place]) 07/20/2019  . Encounter for therapeutic drug level monitoring 07/20/2019  . Need for vaccination against Streptococcus pneumoniae using pneumococcal conjugate vaccine 13 03/05/2019  . GERD (gastroesophageal reflux disease) 02/08/2018  . Obstructive chronic bronchitis without exacerbation ([place]) 02/08/2018  . Encounter for general adult medical examination with abnormal findings 12/18/2017  . Atherosclerosis of aorta ([place]) 12/18/2017  . Dysuria 12/18/2017  . Cerebrovascular disease 11/01/2017  . HOH (hard of hearing) 11/01/2017  . HTN (hypertension) 11/01/2017  . Hyperlipidemia, unspecified 11/01/2017  . Hx of adenomatous polyp of colon 12/16/2016  . Squamous cell lung cancer ([place])   . Lung cancer, 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[place]) 05/07/2012    1. Obstructive chronic bronchitis without exacerbation ([place]) Stable, continue present management at this time.   2. Subacute maxillary sinusitis Advised patient to take entire course of antibiotics as prescribed with food. Pt should return to clinic in 7-10 days if symptoms fail to improve or new symptoms develop.  - azithromycin (ZITHROMAX) 250 MG tablet; Take as directed.  Dispense: 6 tablet; Refill: 0  3. Essential hypertension Controlled, continue present management.  4. Gastroesophageal reflux disease without esophagitis No complaints, continue Protonix.   5. SOB (shortness of breath) - Spirometry with Graph  General Counseling: I have discussed the findings of the evaluation and examination with Jeneen Rinks.  I have also discussed any further diagnostic evaluation thatmay be needed or ordered today. Jarom verbalizes understanding of the findings of todays visit. We also  reviewed his medications today and discussed drug interactions and side effects including but not limited excessive drowsiness and altered mental states. We also discussed that there is always a risk not just to him but also people around him. he has been encouraged to call the office with any questions or concerns that should arise related to todays visit.  Orders Placed This Encounter  Procedures  . Spirometry with Graph    Order Specific Question:   Where should this test be performed?    Answer:   Other     Time spent: 25 This patient was seen by Orson Gear AGNP-C in Collaboration with Dr. Devona Konig as a part of collaborative care agreement.   I have personally obtained a history, examined the patient, evaluated laboratory and imaging results, formulated the assessment and plan and placed orders.    Allyne Gee, MD [facility] Pulmonary  and Critical Care Sleep medicine

## 2019-10-22 DIAGNOSIS — J449 Chronic obstructive pulmonary disease, unspecified: Secondary | ICD-10-CM | POA: Diagnosis not present

## 2019-11-22 DIAGNOSIS — J449 Chronic obstructive pulmonary disease, unspecified: Secondary | ICD-10-CM | POA: Diagnosis not present

## 2019-12-18 ENCOUNTER — Telehealth: Payer: Self-pay

## 2019-12-18 NOTE — Telephone Encounter (Signed)
Confirmed and screened for 12-20-19 ov. 

## 2019-12-20 ENCOUNTER — Other Ambulatory Visit: Payer: Self-pay

## 2019-12-20 ENCOUNTER — Encounter: Payer: Self-pay | Admitting: Nurse Practitioner

## 2019-12-20 ENCOUNTER — Ambulatory Visit (INDEPENDENT_AMBULATORY_CARE_PROVIDER_SITE_OTHER): Payer: Medicare Other | Admitting: Nurse Practitioner

## 2019-12-20 VITALS — BP 132/82 | HR 79 | Temp 96.9°F | Resp 16 | Ht 67.0 in | Wt 201.0 lb

## 2019-12-20 DIAGNOSIS — J4489 Other specified chronic obstructive pulmonary disease: Secondary | ICD-10-CM

## 2019-12-20 DIAGNOSIS — I679 Cerebrovascular disease, unspecified: Secondary | ICD-10-CM

## 2019-12-20 DIAGNOSIS — E782 Mixed hyperlipidemia: Secondary | ICD-10-CM

## 2019-12-20 DIAGNOSIS — J449 Chronic obstructive pulmonary disease, unspecified: Secondary | ICD-10-CM | POA: Diagnosis not present

## 2019-12-20 DIAGNOSIS — I10 Essential (primary) hypertension: Secondary | ICD-10-CM

## 2019-12-20 NOTE — Progress Notes (Signed)
[facility] [place], [place] [address]  Internal MEDICINE  Office Visit Note  Patient Name: Derek Webb  [id number]  [id number]  Date of Service: 01/02/2020  Chief Complaint  Patient presents with  . Hypertension  . Hyperlipidemia  . Gastroesophageal Reflux    The patient is here for routine follow up. Blood pressure is mildly elevated upon arrival. Improves during the visit. States that he is doing well. Asthma/COPD is swell managed. He has no concerns or complaints to discuss today.       Current Medication: Outpatient Encounter Medications as of 12/20/2019  Medication Sig  . aspirin 81 MG tablet Take 81 mg by mouth daily.  Marland Kitchen atorvastatin (LIPITOR) 80 MG tablet Take 1 tablet (80 mg total) by mouth daily.  . budesonide-formoterol (SYMBICORT) 160-4.5 MCG/ACT inhaler Inhale 1 puff into the lungs 2 (two) times daily.  . Cholecalciferol (VITAMIN D) 2000 UNITS CAPS Take by mouth. VITAMIN D 3 2000 IU  ONE SOFTGEL DAILY  --OTC  . clopidogrel (PLAVIX) 75 MG tablet Take 1 tablet (75 mg total) by mouth daily.  . enalapril (VASOTEC) 20 MG tablet Take 1 tablet (20 mg total) by mouth daily.  Marland Kitchen ezetimibe (ZETIA) 10 MG tablet Take 1 tablet (10 mg total) by mouth daily.  . OXYGEN Inhale into the lungs. At night  . pantoprazole (PROTONIX) 40 MG tablet Take 1 tablet (40 mg total) by mouth daily. IN AM  . phenytoin (DILANTIN) 100 MG ER capsule Take 4 capsules (400 mg total) by mouth at bedtime.  . [DISCONTINUED] azithromycin (ZITHROMAX) 250 MG tablet Take as directed. (Patient not taking: Reported on 12/20/2019)   No facility-administered encounter medications on file as of 12/20/2019.    Surgical History: Past Surgical History:  Procedure Laterality Date  . CAROTID SURGERY 2009 -LEFT    . COLON POLYP EXCISION    . COLON SURGERY    . COLONOSCOPY WITH PROPOFOL N/A 04/18/2017   Procedure: COLONOSCOPY WITH PROPOFOL;  Surgeon: Manya Silvas, MD;  Location: [facility]  ENDOSCOPY;  Service: Endoscopy;  Laterality: N/A;  . ESOPHAGOGASTRODUODENOSCOPY    . IR GENERIC HISTORICAL  01/21/2016   IR RADIOLOGIST EVAL & MGMT 01/21/2016 Aletta Edouard, MD [facility] INTERV RAD  . IR GENERIC HISTORICAL  10/22/2014   IR RADIOLOGIST EVAL & MGMT 10/22/2014 Aletta Edouard, MD [facility] INTERV RAD  . IR RADIOLOGIST EVAL & MGMT  02/02/2017  . IR RADIOLOGIST EVAL & MGMT  03/08/2018  . percutaneous biopsy      Medical History: Past Medical History:  Diagnosis Date  . Alcoholism ([place])   . COPD (chronic obstructive pulmonary disease) (HCC)    SEVERE COPD -OCCASIONALLY USES OXYGEN AT NIGHT-DOES NOT USE INHALERS ON REGULAR BASIS  . Elevated cholesterol   . GERD (gastroesophageal reflux disease)   . HOH (hard of hearing)   . Hypertension   . Lung cancer ([place])    LEFT UPPER LUNG CARCINOMA-NOT A CANDIDATE FOR SURGICAL RESECTION BECAUSE OF HIS COPD AND POOR CANDIDATE FOR RADIATION BECAUSE OF TRANSPORTATION PROBLEMS  . Seizures ([place])    CHRONIC DILATIN - PT STATES NO SEIZURES IN PAST COUPLE OF YEARS; PT STATES HIS SEIZURES CAUSED NUMBNESS OF ARMS AND HANDS AND NOT ABLE TO MOVE HIS ARMS  . Stroke ([place])    2 TO 3 YRS AGO-AFFECTED HIS SPEECH--ABLE TO AMBULATE WITHOUT ASSIST AND DOES YARD WORK.  DECREASED HEARING IN BOTH EARS SINCE STROKE.  Marland Kitchen Vitamin D deficiency     Family History: Family  History  Problem Relation Age of Onset  . Colon polyps Sister     Social History   Socioeconomic History  . Marital status: Single    Spouse name: Not on file  . Number of children: Not on file  . Years of education: Not on file  . Highest education level: Not on file  Occupational History  . Not on file  Tobacco Use  . Smoking status: Former Smoker    Packs/day: 1.00    Years: 35.00    Pack years: 35.00    Types: Cigarettes    Quit date: 12/22/2011    Years since quitting: 8.0  . Smokeless tobacco: Never Used  Substance and Sexual Activity  . Alcohol use: Yes    Alcohol/week: 14.0  standard drinks    Types: 14 Cans of beer per week    Comment:    . Drug use: No  . Sexual activity: Not on file  Other Topics Concern  . Not on file  Social History Narrative  . Not on file   Social Determinants of Health   Financial Resource Strain:   . Difficulty of Paying Living Expenses:   Food Insecurity:   . Worried About Charity fundraiser in the Last Year:   . Arboriculturist in the Last Year:   Transportation Needs:   . Film/video editor (Medical):   Marland Kitchen Lack of Transportation (Non-Medical):   Physical Activity:   . Days of Exercise per Week:   . Minutes of Exercise per Session:   Stress:   . Feeling of Stress :   Social Connections:   . Frequency of Communication with Friends and Family:   . Frequency of Social Gatherings with Friends and Family:   . Attends Religious Services:   . Active Member of Clubs or Organizations:   . Attends Archivist Meetings:   Marland Kitchen Marital Status:   Intimate Partner Violence:   . Fear of Current or Ex-Partner:   . Emotionally Abused:   Marland Kitchen Physically Abused:   . Sexually Abused:       Review of Systems  Constitutional: Negative for activity change, chills, fatigue and unexpected weight change.  HENT: Negative for congestion, postnasal drip, rhinorrhea, sneezing, sore throat and voice change.   Respiratory: Positive for wheezing. Negative for cough, chest tightness and shortness of breath.        Intermittent and well managed with current medication.   Cardiovascular: Negative for chest pain and palpitations.  Gastrointestinal: Negative for abdominal distention, abdominal pain, constipation, diarrhea, nausea and vomiting.  Endocrine: Negative for cold intolerance, heat intolerance, polydipsia and polyuria.  Musculoskeletal: Negative for arthralgias, back pain, joint swelling, myalgias and neck pain.  Skin: Negative for rash.  Allergic/Immunologic: Negative for environmental allergies.  Neurological: Positive for  seizures and headaches. Negative for dizziness, tremors and numbness.       Well managed.   Hematological: Negative for adenopathy. Does not bruise/bleed easily.  Psychiatric/Behavioral: Negative for behavioral problems (Depression), sleep disturbance and suicidal ideas. The patient is not nervous/anxious.     Today's Vitals   12/20/19 1428  BP: 132/82  Pulse: 79  Resp: 16  Temp: (!) 96.9 F (36.1 C)  SpO2: 96%  Weight: 201 lb (91.2 kg)  Height: 5\' 7"  (1.702 m)   Body mass index is 31.48 kg/m.  Physical Exam Vitals and nursing note reviewed.  Constitutional:      General: He is not in acute distress.  Appearance: Normal appearance. He is well-developed. He is not diaphoretic.  HENT:     Head: Normocephalic and atraumatic.     Nose: Nose normal.     Mouth/Throat:     Pharynx: No oropharyngeal exudate.  Eyes:     Extraocular Movements: Extraocular movements intact.     Pupils: Pupils are equal, round, and reactive to light.  Neck:     Thyroid: No thyromegaly.     Vascular: No carotid bruit or JVD.     Trachea: No tracheal deviation.  Cardiovascular:     Rate and Rhythm: Normal rate and regular rhythm.     Pulses: Normal pulses.     Heart sounds: Normal heart sounds. No murmur. No friction rub. No gallop.   Pulmonary:     Effort: Pulmonary effort is normal. No respiratory distress.     Breath sounds: Wheezing present. No rales.     Comments: Mild wheezing present throughout the lung fields. They clear with cough.  Chest:     Chest wall: No tenderness.  Abdominal:     Palpations: Abdomen is soft.  Musculoskeletal:        General: Normal range of motion.     Cervical back: Normal range of motion and neck supple.  Lymphadenopathy:     Cervical: No cervical adenopathy.  Skin:    General: Skin is warm and dry.  Neurological:     Mental Status: He is alert and oriented to person, place, and time. Mental status is at baseline.     Cranial Nerves: No cranial nerve  deficit.  Psychiatric:        Mood and Affect: Mood normal.        Behavior: Behavior normal.        Thought Content: Thought content normal.        Judgment: Judgment normal.    Assessment/Plan:  1. Essential hypertension Stable. Continue bp medication as prescribed   2. Cerebrovascular disease Stable. Monitor closely  3. Obstructive chronic bronchitis without exacerbation ([place]) Well managed. Continue inhalers and respiratory medication as prescribed   4. Mixed hyperlipidemia Stable. Continue atorvastatin as prescribed    General Counseling: Derek Webb understanding of the findings of todays visit and agrees with plan of treatment. I have discussed any further diagnostic evaluation that may be needed or ordered today. We also reviewed his medications today. he has been encouraged to call the office with any questions or concerns that should arise related to todays visit.   Hypertension Counseling:   The following hypertensive lifestyle modification were recommended and discussed:  1. Limiting alcohol intake to less than 1 oz/day of ethanol:(24 oz of beer or 8 oz of wine or 2 oz of 100-proof whiskey). 2. Take baby ASA 81 mg daily. 3. Importance of regular aerobic exercise and losing weight. 4. Reduce dietary saturated fat and cholesterol intake for overall cardiovascular health. 5. Maintaining adequate dietary potassium, calcium, and magnesium intake. 6. Regular monitoring of the blood pressure. 7. Reduce sodium intake to less than 100 mmol/day (less than 2.3 gm of sodium or less than 6 gm of sodium choride)   This patient was seen by [place] with Dr Lavera Guise as a part of collaborative care agreement  Total time spent: 25 Minutes   Time spent includes review of chart, medications, test results, and follow up plan with the patient.      Dr Lavera Guise Internal medicine

## 2019-12-22 DIAGNOSIS — J449 Chronic obstructive pulmonary disease, unspecified: Secondary | ICD-10-CM | POA: Diagnosis not present

## 2020-01-22 DIAGNOSIS — J449 Chronic obstructive pulmonary disease, unspecified: Secondary | ICD-10-CM | POA: Diagnosis not present

## 2020-02-07 ENCOUNTER — Telehealth: Payer: Self-pay

## 2020-02-07 NOTE — Telephone Encounter (Signed)
Confirmed appointment on 02/11/2020 and screened for covid. klh

## 2020-02-11 ENCOUNTER — Encounter: Payer: Self-pay | Admitting: Internal Medicine

## 2020-02-11 ENCOUNTER — Other Ambulatory Visit: Payer: Self-pay

## 2020-02-11 ENCOUNTER — Ambulatory Visit (INDEPENDENT_AMBULATORY_CARE_PROVIDER_SITE_OTHER): Payer: Medicare Other | Admitting: Internal Medicine

## 2020-02-11 VITALS — BP 124/68 | HR 67 | Temp 97.5°F | Resp 16 | Ht 67.0 in | Wt 198.0 lb

## 2020-02-11 DIAGNOSIS — J449 Chronic obstructive pulmonary disease, unspecified: Secondary | ICD-10-CM | POA: Diagnosis not present

## 2020-02-11 DIAGNOSIS — I10 Essential (primary) hypertension: Secondary | ICD-10-CM

## 2020-02-11 DIAGNOSIS — C3492 Malignant neoplasm of unspecified part of left bronchus or lung: Secondary | ICD-10-CM | POA: Diagnosis not present

## 2020-02-11 DIAGNOSIS — R0602 Shortness of breath: Secondary | ICD-10-CM

## 2020-02-11 DIAGNOSIS — K219 Gastro-esophageal reflux disease without esophagitis: Secondary | ICD-10-CM

## 2020-02-11 MED ORDER — BUDESONIDE-FORMOTEROL FUMARATE 160-4.5 MCG/ACT IN AERO: 1.0000 | INHALATION_SPRAY | Freq: Two times a day (BID) | RESPIRATORY_TRACT | 3 refills | Status: DC

## 2020-02-11 NOTE — Progress Notes (Signed)
[facility] [place], [place] [address]  Pulmonary Sleep Medicine   Office Visit Note  Patient Name: Derek Webb DOB: 04/17/1951 MRN [id number]  Date of Service: 02/11/2020  Complaints/HPI: Pt is here for pulmonary follow up.  He has a history of copd, with nocturnal hypoxia.  He currently uses 2 lpm of oxygen when he sleeping.  He denies any new or worsening symptoms.  He reports a history of smoking, but has been quit for some years.  He Denies Chest pain, Shortness of breath,  Hemoptysis or other symptoms. He is using Symbicort with good results.  He also has a history of squamous cell cancer of the LUL.  He was in remission and his last Ct scan was 2 years ago.     ROS  General: (-) fever, (-) chills, (-) night sweats, (-) weakness Skin: (-) rashes, (-) itching,. Eyes: (-) visual changes, (-) redness, (-) itching. Nose and Sinuses: (-) nasal stuffiness or itchiness, (-) postnasal drip, (-) nosebleeds, (-) sinus trouble. Mouth and Throat: (-) sore throat, (-) hoarseness. Neck: (-) swollen glands, (-) enlarged thyroid, (-) neck pain. Respiratory: - cough, (-) bloody sputum, - shortness of breath, - wheezing. Cardiovascular: - ankle swelling, (-) chest pain. Lymphatic: (-) lymph node enlargement. Neurologic: (-) numbness, (-) tingling. Psychiatric: (-) anxiety, (-) depression   Current Medication: Outpatient Encounter Medications as of 02/11/2020  Medication Sig  . aspirin 81 MG tablet Take 81 mg by mouth daily.  Marland Kitchen atorvastatin (LIPITOR) 80 MG tablet Take 1 tablet (80 mg total) by mouth daily.  . budesonide-formoterol (SYMBICORT) 160-4.5 MCG/ACT inhaler Inhale 1 puff into the lungs 2 (two) times daily.  . Cholecalciferol (VITAMIN D) 2000 UNITS CAPS Take by mouth. VITAMIN D 3 2000 IU  ONE SOFTGEL DAILY  --OTC  . clopidogrel (PLAVIX) 75 MG tablet Take 1 tablet (75 mg total) by mouth daily.  . enalapril (VASOTEC) 20 MG tablet Take 1 tablet (20 mg total)  by mouth daily.  Marland Kitchen ezetimibe (ZETIA) 10 MG tablet Take 1 tablet (10 mg total) by mouth daily.  . OXYGEN Inhale into the lungs. At night  . pantoprazole (PROTONIX) 40 MG tablet Take 1 tablet (40 mg total) by mouth daily. IN AM  . phenytoin (DILANTIN) 100 MG ER capsule Take 4 capsules (400 mg total) by mouth at bedtime.   No facility-administered encounter medications on file as of 02/11/2020.    Surgical History: Past Surgical History:  Procedure Laterality Date  . CAROTID SURGERY 2009 -LEFT    . COLON POLYP EXCISION    . COLON SURGERY    . COLONOSCOPY WITH PROPOFOL N/A 04/18/2017   Procedure: COLONOSCOPY WITH PROPOFOL;  Surgeon: Manya Silvas, MD;  Location: [facility] ENDOSCOPY;  Service: Endoscopy;  Laterality: N/A;  . ESOPHAGOGASTRODUODENOSCOPY    . IR GENERIC HISTORICAL  01/21/2016   IR RADIOLOGIST EVAL & MGMT 01/21/2016 Aletta Edouard, MD [facility] INTERV RAD  . IR GENERIC HISTORICAL  10/22/2014   IR RADIOLOGIST EVAL & MGMT 10/22/2014 Aletta Edouard, MD [facility] INTERV RAD  . IR RADIOLOGIST EVAL & MGMT  02/02/2017  . IR RADIOLOGIST EVAL & MGMT  03/08/2018  . percutaneous biopsy      Medical History: Past Medical History:  Diagnosis Date  . Alcoholism ([place])   . COPD (chronic obstructive pulmonary disease) (HCC)    SEVERE COPD -OCCASIONALLY USES OXYGEN AT NIGHT-DOES NOT USE INHALERS ON REGULAR BASIS  . Elevated cholesterol   . GERD (gastroesophageal reflux disease)   .  HOH (hard of hearing)   . Hypertension   . Lung cancer ([place])    LEFT UPPER LUNG CARCINOMA-NOT A CANDIDATE FOR SURGICAL RESECTION BECAUSE OF HIS COPD AND POOR CANDIDATE FOR RADIATION BECAUSE OF TRANSPORTATION PROBLEMS  . Seizures ([place])    CHRONIC DILATIN - PT STATES NO SEIZURES IN PAST COUPLE OF YEARS; PT STATES HIS SEIZURES CAUSED NUMBNESS OF ARMS AND HANDS AND NOT ABLE TO MOVE HIS ARMS  . Stroke ([place])    2 TO 3 YRS AGO-AFFECTED HIS SPEECH--ABLE TO AMBULATE WITHOUT ASSIST AND DOES YARD WORK.  DECREASED HEARING IN BOTH EARS  SINCE STROKE.  Marland Kitchen Vitamin D deficiency     Family History: Family History  Problem Relation Age of Onset  . Colon polyps Sister     Social History: Social History   Socioeconomic History  . Marital status: Single    Spouse name: Not on file  . Number of children: Not on file  . Years of education: Not on file  . Highest education level: Not on file  Occupational History  . Not on file  Tobacco Use  . Smoking status: Former Smoker    Packs/day: 1.00    Years: 35.00    Pack years: 35.00    Types: Cigarettes    Quit date: 12/22/2011    Years since quitting: 8.1  . Smokeless tobacco: Never Used  Vaping Use  . Vaping Use: Never used  Substance and Sexual Activity  . Alcohol use: Yes    Alcohol/week: 14.0 standard drinks    Types: 14 Cans of beer per week    Comment:    . Drug use: No  . Sexual activity: Not on file  Other Topics Concern  . Not on file  Social History Narrative  . Not on file   Social Determinants of Health   Financial Resource Strain:   . Difficulty of Paying Living Expenses:   Food Insecurity:   . Worried About Charity fundraiser in the Last Year:   . Arboriculturist in the Last Year:   Transportation Needs:   . Film/video editor (Medical):   Marland Kitchen Lack of Transportation (Non-Medical):   Physical Activity:   . Days of Exercise per Week:   . Minutes of Exercise per Session:   Stress:   . Feeling of Stress :   Social Connections:   . Frequency of Communication with Friends and Family:   . Frequency of Social Gatherings with Friends and Family:   . Attends Religious Services:   . Active Member of Clubs or Organizations:   . Attends Archivist Meetings:   Marland Kitchen Marital Status:   Intimate Partner Violence:   . Fear of Current or Ex-Partner:   . Emotionally Abused:   Marland Kitchen Physically Abused:   . Sexually Abused:     Vital Signs: Blood pressure (!) 151/85, pulse 67, temperature (!) 97.5 F (36.4 C), resp. rate 16, height 5\' 7"  (1.702  m), weight 198 lb (89.8 kg), SpO2 97 %.  Examination: General Appearance: The patient is well-developed, well-nourished, and in no distress. Skin: Gross inspection of skin unremarkable. Head: normocephalic, no gross deformities. Eyes: no gross deformities noted. ENT: ears appear grossly normal no exudates. Neck: Supple. No thyromegaly. No LAD. Respiratory: course breath sounds. Cardiovascular: Normal S1 and S2 without murmur or rub. Extremities: No cyanosis. pulses are equal. Neurologic: Alert and oriented. No involuntary movements.  LABS: No results found for this or any previous visit (from the  past 2160 hour(s)).  Radiology: IR Radiologist Eval & Mgmt  Result Date: 03/08/2018 Please refer to notes tab for details about interventional procedure. (Op Note)   No results found.  No results found.    Assessment and Plan: Patient Active Problem List   Diagnosis Date Noted  . Seizures ([place]) 07/20/2019  . Encounter for therapeutic drug level monitoring 07/20/2019  . Need for vaccination against Streptococcus pneumoniae using pneumococcal conjugate vaccine 13 03/05/2019  . GERD (gastroesophageal reflux disease) 02/08/2018  . Obstructive chronic bronchitis without exacerbation ([place]) 02/08/2018  . Encounter for general adult medical examination with abnormal findings 12/18/2017  . Atherosclerosis of aorta ([place]) 12/18/2017  . Dysuria 12/18/2017  . Cerebrovascular disease 11/01/2017  . HOH (hard of hearing) 11/01/2017  . Essential hypertension 11/01/2017  . Mixed hyperlipidemia 11/01/2017  . Hx of adenomatous polyp of colon 12/16/2016  . Squamous cell lung cancer ([place])   . Lung cancer, 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[place]) 05/07/2012    1. Obstructive chronic bronchitis without exacerbation (HCC) Stable, refill Symbicort.   - budesonide-formoterol (SYMBICORT) 160-4.5 MCG/ACT inhaler; Inhale 1 puff into the lungs 2 (two) times daily.  Dispense: 1 Inhaler; Refill: 3  2. Essential  hypertension Continue to monitor bp.    3. Gastroesophageal reflux disease without esophagitis Stable, continue current management.   4. Shortness of breath - CT CHEST WO CONTRAST; Future  5. Squamous cell lung cancer, left ([place]) Ct chest for follow up on Lung CA.  - CT CHEST WO CONTRAST; Future  General Counseling: I have discussed the findings of the evaluation and examination with Jeneen Rinks.  I have also discussed any further diagnostic evaluation thatmay be needed or ordered today. Cassius verbalizes understanding of the findings of todays visit. We also reviewed his medications today and discussed drug interactions and side effects including but not limited excessive drowsiness and altered mental states. We also discussed that there is always a risk not just to him but also people around him. he has been encouraged to call the office with any questions or concerns that should arise related to todays visit.  No orders of the defined types were placed in this encounter.    Time spent: 30 This patient was seen by Orson Gear AGNP-C in Collaboration with Dr. Devona Konig as a part of collaborative care agreement.   I have personally obtained a history, examined the patient, evaluated laboratory and imaging results, formulated the assessment and plan and placed orders.    Allyne Gee, MD [facility] Pulmonary and Critical Care Sleep medicine

## 2020-02-19 ENCOUNTER — Ambulatory Visit: Payer: Medicaid Other

## 2020-02-21 DIAGNOSIS — J449 Chronic obstructive pulmonary disease, unspecified: Secondary | ICD-10-CM | POA: Diagnosis not present

## 2020-02-22 ENCOUNTER — Other Ambulatory Visit: Payer: Self-pay

## 2020-02-22 ENCOUNTER — Ambulatory Visit
Admission: RE | Admit: 2020-02-22 | Discharge: 2020-02-22 | Disposition: A | Discharge status: Home / Self Care | Payer: Medicare Other | Source: Ambulatory Visit | Attending: Adult Health | Admitting: Adult Health

## 2020-02-22 DIAGNOSIS — C3492 Malignant neoplasm of unspecified part of left bronchus or lung: Secondary | ICD-10-CM | POA: Insufficient documentation

## 2020-02-22 DIAGNOSIS — R0602 Shortness of breath: Secondary | ICD-10-CM

## 2020-02-26 ENCOUNTER — Telehealth: Payer: Self-pay | Admitting: Internal Medicine

## 2020-02-26 NOTE — Telephone Encounter (Signed)
-----   Message from Lavera Guise, MD sent at 02/24/2020  8:53 AM EDT ----- Please call to see how is he feeling, CT scan is reviewed

## 2020-02-27 ENCOUNTER — Telehealth: Payer: Self-pay

## 2020-02-27 NOTE — Telephone Encounter (Signed)
Spoke with patient sister Derek Webb) they will schedule appointment with [facility] in August for ct review since his ct scan compared to 2019 hasn't had much change and he is feeling fine and no different from when he was in here for his regular 4 month follow up and his cardiothoracic surgeon has released him and to keep pulmonary follow ups and routine ct scan just to see if any changes and can do referral back to them if anything abnormal comes back on ct scans. Beth

## 2020-02-27 NOTE — Telephone Encounter (Signed)
Results will be discussed at the time of visit

## 2020-03-07 ENCOUNTER — Ambulatory Visit: Payer: Medicare Other | Admitting: Nurse Practitioner

## 2020-03-23 DIAGNOSIS — J449 Chronic obstructive pulmonary disease, unspecified: Secondary | ICD-10-CM | POA: Diagnosis not present

## 2020-04-01 ENCOUNTER — Encounter: Payer: Self-pay | Admitting: Internal Medicine

## 2020-04-01 ENCOUNTER — Ambulatory Visit (INDEPENDENT_AMBULATORY_CARE_PROVIDER_SITE_OTHER): Payer: Medicare Other | Admitting: Hospice and Palliative Medicine

## 2020-04-01 ENCOUNTER — Other Ambulatory Visit: Payer: Self-pay

## 2020-04-01 DIAGNOSIS — J449 Chronic obstructive pulmonary disease, unspecified: Secondary | ICD-10-CM

## 2020-04-01 DIAGNOSIS — Z85118 Personal history of other malignant neoplasm of bronchus and lung: Secondary | ICD-10-CM | POA: Diagnosis not present

## 2020-04-01 DIAGNOSIS — J4489 Other specified chronic obstructive pulmonary disease: Secondary | ICD-10-CM

## 2020-04-01 NOTE — Progress Notes (Signed)
[facility] [place], [place] [address]  Pulmonary Sleep Medicine   Office Visit Note  Patient Name: Derek Webb DOB: 05-08-1951 MRN [id number]  Date of Service: 04/03/2020  Complaints/HPI: Patient is here for routine pulmonary follow-up. Here to discuss results of his recent chest CT. CT ordered for follow-up on lung mass that was found in 2019. CT reveals no apparent changes since 2019 CT scan, appears stable at this time. Will continue to follow-up with scans as appropriate. Scarring of right middle and upper lobes appears increased with increase in atelectasis. Shortness of breath, chest tightness, wheeze and cough well controlled with Symbicort inhaler at this time.  ROS  General: (-) fever, (-) chills, (-) night sweats, (-) weakness Skin: (-) rashes, (-) itching,. Eyes: (-) visual changes, (-) redness, (-) itching. Nose and Sinuses: (-) nasal stuffiness or itchiness, (-) postnasal drip, (-) nosebleeds, (-) sinus trouble. Mouth and Throat: (-) sore throat, (-) hoarseness. Neck: (-) swollen glands, (-) enlarged thyroid, (-) neck pain. Respiratory: - cough, (-) bloody sputum, - shortness of breath, - wheezing. Cardiovascular: - ankle swelling, (-) chest pain. Lymphatic: (-) lymph node enlargement. Neurologic: (-) numbness, (-) tingling. Psychiatric: (-) anxiety, (-) depression   Current Medication: Outpatient Encounter Medications as of 04/01/2020  Medication Sig   aspirin 81 MG tablet Take 81 mg by mouth daily.   atorvastatin (LIPITOR) 80 MG tablet Take 1 tablet (80 mg total) by mouth daily.   budesonide-formoterol (SYMBICORT) 160-4.5 MCG/ACT inhaler Inhale 1 puff into the lungs 2 (two) times daily.   Cholecalciferol (VITAMIN D) 2000 UNITS CAPS Take by mouth. VITAMIN D 3 2000 IU  ONE SOFTGEL DAILY  --OTC   clopidogrel (PLAVIX) 75 MG tablet Take 1 tablet (75 mg total) by mouth daily.   enalapril (VASOTEC) 20 MG tablet Take 1 tablet (20 mg  total) by mouth daily.   ezetimibe (ZETIA) 10 MG tablet Take 1 tablet (10 mg total) by mouth daily.   OXYGEN Inhale into the lungs. At night   pantoprazole (PROTONIX) 40 MG tablet Take 1 tablet (40 mg total) by mouth daily. IN AM   phenytoin (DILANTIN) 100 MG ER capsule Take 4 capsules (400 mg total) by mouth at bedtime.   No facility-administered encounter medications on file as of 04/01/2020.    Surgical History: Past Surgical History:  Procedure Laterality Date   CAROTID SURGERY 2009 -LEFT     COLON POLYP EXCISION     COLON SURGERY     COLONOSCOPY WITH PROPOFOL N/A 04/18/2017   Procedure: COLONOSCOPY WITH PROPOFOL;  Surgeon: Manya Silvas, MD;  Location: [facility] ENDOSCOPY;  Service: Endoscopy;  Laterality: N/A;   ESOPHAGOGASTRODUODENOSCOPY     IR GENERIC HISTORICAL  01/21/2016   IR RADIOLOGIST EVAL & MGMT 01/21/2016 Aletta Edouard, MD [facility] INTERV RAD   IR GENERIC HISTORICAL  10/22/2014   IR RADIOLOGIST EVAL & MGMT 10/22/2014 Aletta Edouard, MD [facility] INTERV RAD   IR RADIOLOGIST EVAL & MGMT  02/02/2017   IR RADIOLOGIST EVAL & MGMT  03/08/2018   percutaneous biopsy      Medical History: Past Medical History:  Diagnosis Date   Alcoholism ([place])    COPD (chronic obstructive pulmonary disease) (HCC)    SEVERE COPD -OCCASIONALLY USES OXYGEN AT NIGHT-DOES NOT USE INHALERS ON REGULAR BASIS   Elevated cholesterol    GERD (gastroesophageal reflux disease)    HOH (hard of hearing)    Hypertension    Lung cancer ([place])    LEFT UPPER  LUNG CARCINOMA-NOT A CANDIDATE FOR SURGICAL RESECTION BECAUSE OF HIS COPD AND POOR CANDIDATE FOR RADIATION BECAUSE OF TRANSPORTATION PROBLEMS   Seizures ([place])    CHRONIC DILATIN - PT STATES NO SEIZURES IN PAST COUPLE OF YEARS; PT STATES HIS SEIZURES CAUSED NUMBNESS OF ARMS AND HANDS AND NOT ABLE TO MOVE HIS ARMS   Stroke ([place])    2 TO 3 YRS AGO-AFFECTED HIS SPEECH--ABLE TO AMBULATE WITHOUT ASSIST AND DOES YARD WORK.  DECREASED HEARING IN  BOTH EARS SINCE STROKE.   Vitamin D deficiency     Family History: Family History  Problem Relation Age of Onset   Colon polyps Sister     Social History: Social History   Socioeconomic History   Marital status: Single    Spouse name: Not on file   Number of children: Not on file   Years of education: Not on file   Highest education level: Not on file  Occupational History   Not on file  Tobacco Use   Smoking status: Former Smoker    Packs/day: 1.00    Years: 35.00    Pack years: 35.00    Types: Cigarettes    Quit date: 12/22/2011    Years since quitting: 8.2   Smokeless tobacco: Never Used  Vaping Use   Vaping Use: Never used  Substance and Sexual Activity   Alcohol use: Yes    Comment: occosionally    Drug use: No   Sexual activity: Not on file  Other Topics Concern   Not on file  Social History Narrative   Not on file   Social Determinants of Health   Financial Resource Strain:    Difficulty of Paying Living Expenses:   Food Insecurity:    Worried About Charity fundraiser in the Last Year:    Arboriculturist in the Last Year:   Transportation Needs:    Film/video editor (Medical):    Lack of Transportation (Non-Medical):   Physical Activity:    Days of Exercise per Week:    Minutes of Exercise per Session:   Stress:    Feeling of Stress :   Social Connections:    Frequency of Communication with Friends and Family:    Frequency of Social Gatherings with Friends and Family:    Attends Religious Services:    Active Member of Clubs or Organizations:    Attends Music therapist:    Marital Status:   Intimate Partner Violence:    Fear of Current or Ex-Partner:    Emotionally Abused:    Physically Abused:    Sexually Abused:     Vital Signs: Blood pressure 126/80, pulse 76, temperature 97.6 F (36.4 C), resp. rate 16, height 5\' 7"  (1.702 m), weight 195 lb 3.2 oz (88.5 kg), SpO2 96  %.  Examination: General Appearance: The patient is well-developed, well-nourished, and in no distress. Skin: Gross inspection of skin unremarkable. Head: normocephalic, no gross deformities. Eyes: no gross deformities noted. ENT: ears appear grossly normal no exudates. Neck: Supple. No thyromegaly. No LAD. Respiratory: Clear lung sounds bilaterally. Cardiovascular: Normal S1 and S2 without murmur or rub. Extremities: No cyanosis. pulses are equal. Neurologic: Alert and oriented. No involuntary movements.  LABS: No results found for this or any previous visit (from the past 2160 hour(s)).  Radiology: CT CHEST WO CONTRAST  Result Date: 02/23/2020 CLINICAL DATA:  Shortness of breath. Patient has a history of left upper lobe lung cancer status post thermal ablation in 2013.  EXAM: CT CHEST WITHOUT CONTRAST TECHNIQUE: Multidetector CT imaging of the chest was performed following the standard protocol without IV contrast. COMPARISON:  CT chest dated 03/02/2018 FINDINGS: Cardiovascular: Vascular calcifications are seen in the aortic arch. Normal heart size. No pericardial effusion. Mediastinum/Nodes: No enlarged mediastinal or axillary lymph nodes. Aspirated debris is seen in the trachea. The thyroid and esophagus demonstrate no significant findings. Lungs/Pleura: Spiculated mass/scar in the left upper lobe measures 3.3 x 1.2 cm (series 3, image 52) and does not appear significantly changed since 03/02/2018. Atelectasis/scarring of the right middle and upper lobes along the major and minor fissures appears increased. Upper lobe predominant centrilobular emphysema is noted. Upper Abdomen: A left adrenal nodule is not changed since at least 2013 and benign. Musculoskeletal: Degenerative changes are seen in the spine. IMPRESSION: 1. Post ablation scarring in the left upper lobe is not significantly changed since 03/02/2018. 2. Atelectasis/scarring of the right middle and upper lobes along the major and  minor fissures appears increased. Aortic Atherosclerosis (ICD10-I70.0). Electronically Signed   By: Zerita Boers M.D.   On: 02/23/2020 17:22    Assessment and Plan: Patient Active Problem List   Diagnosis Date Noted   Seizures ([place]) 07/20/2019   Encounter for therapeutic drug level monitoring 07/20/2019   Need for vaccination against Streptococcus pneumoniae using pneumococcal conjugate vaccine 13 03/05/2019   GERD (gastroesophageal reflux disease) 02/08/2018   Obstructive chronic bronchitis without exacerbation ([place]) 02/08/2018   Encounter for general adult medical examination with abnormal findings 12/18/2017   Atherosclerosis of aorta ([place]) 12/18/2017   Dysuria 12/18/2017   Cerebrovascular disease 11/01/2017   HOH (hard of hearing) 11/01/2017   Essential hypertension 11/01/2017   Mixed hyperlipidemia 11/01/2017   Hx of adenomatous polyp of colon 12/16/2016   Squamous cell lung cancer ([place])    Lung cancer, 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[place]) 05/07/2012    1. History of lung cancer Discussed findings of follow-up chest CT, mass and scarring appear normal at this time. Continue to be followed by pulmonary for repeat CT scans for potential progression or recurrence of disease.  2. Obstructive chronic bronchitis without exacerbation ([place]) Stable at this time, continue with Symbicort inhaler. Will continue to monitor.  General Counseling: I have discussed the findings of the evaluation and examination with Derek Webb.  I have also discussed any further diagnostic evaluation thatmay be needed or ordered today. Derek Webb verbalizes understanding of the findings of todays visit. We also reviewed his medications today and discussed drug interactions and side effects including but not limited excessive drowsiness and altered mental states. We also discussed that there is always a risk not just to him but also people around him. he has been encouraged to call the office with any questions or concerns that  should arise related to todays visit.   Time spent: 20  I have personally obtained a history, examined the patient, evaluated laboratory and imaging results, formulated the assessment and plan and placed orders. This patient was seen by Casey Burkitt AGNP-C in Collaboration with Dr. Devona Konig as a part of collaborative care agreement.    Allyne Gee, MD [facility] Pulmonary and Critical Care Sleep medicine

## 2020-04-03 NOTE — Patient Instructions (Signed)
Chronic Obstructive Pulmonary Disease Chronic obstructive pulmonary disease (COPD) is a long-term (chronic) lung problem. When you have COPD, it is hard for air to get in and out of your lungs. Usually the condition gets worse over time, and your lungs will never return to normal. There are things you can do to keep yourself as healthy as possible.  Your doctor may treat your condition with: ? Medicines. ? Oxygen. ? Lung surgery.  Your doctor may also recommend: ? Rehabilitation. This includes steps to make your body work better. It may involve a team of specialists. ? Quitting smoking, if you smoke. ? Exercise and changes to your diet. ? Comfort measures (palliative care). Follow these instructions at home: Medicines  Take over-the-counter and prescription medicines only as told by your doctor.  Talk to your doctor before taking any cough or allergy medicines. You may need to avoid medicines that cause your lungs to be dry. Lifestyle  If you smoke, stop. Smoking makes the problem worse. If you need help quitting, ask your doctor.  Avoid being around things that make your breathing worse. This may include smoke, chemicals, and fumes.  Stay active, but remember to rest as well.  Learn and use tips on how to relax.  Make sure you get enough sleep. Most adults need at least 7 hours of sleep every night.  Eat healthy foods. Eat smaller meals more often. Rest before meals. Controlled breathing Learn and use tips on how to control your breathing as told by your doctor. Try:  Breathing in (inhaling) through your nose for 1 second. Then, pucker your lips and breath out (exhale) through your lips for 2 seconds.  Putting one hand on your belly (abdomen). Breathe in slowly through your nose for 1 second. Your hand on your belly should move out. Pucker your lips and breathe out slowly through your lips. Your hand on your belly should move in as you breathe out.  Controlled coughing Learn  and use controlled coughing to clear mucus from your lungs. Follow these steps: 1. Lean your head a little forward. 2. Breathe in deeply. 3. Try to hold your breath for 3 seconds. 4. Keep your mouth slightly open while coughing 2 times. 5. Spit any mucus out into a tissue. 6. Rest and do the steps again 1 or 2 times as needed. General instructions  Make sure you get all the shots (vaccines) that your doctor recommends. Ask your doctor about a flu shot and a pneumonia shot.  Use oxygen therapy and pulmonary rehabilitation if told by your doctor. If you need home oxygen therapy, ask your doctor if you should buy a tool to measure your oxygen level (oximeter).  Make a COPD action plan with your doctor. This helps you to know what to do if you feel worse than usual.  Manage any other conditions you have as told by your doctor.  Avoid going outside when it is very hot, cold, or humid.  Avoid people who have a sickness you can catch (contagious).  Keep all follow-up visits as told by your doctor. This is important. Contact a doctor if:  You cough up more mucus than usual.  There is a change in the color or thickness of the mucus.  It is harder to breathe than usual.  Your breathing is faster than usual.  You have trouble sleeping.  You need to use your medicines more often than usual.  You have trouble doing your normal activities such as getting dressed   or walking around the house. Get help right away if:  You have shortness of breath while resting.  You have shortness of breath that stops you from: ? Being able to talk. ? Doing normal activities.  Your chest hurts for longer than 5 minutes.  Your skin color is more blue than usual.  Your pulse oximeter shows that you have low oxygen for longer than 5 minutes.  You have a fever.  You feel too tired to breathe normally. Summary  Chronic obstructive pulmonary disease (COPD) is a long-term lung problem.  The way your  lungs work will never return to normal. Usually the condition gets worse over time. There are things you can do to keep yourself as healthy as possible.  Take over-the-counter and prescription medicines only as told by your doctor.  If you smoke, stop. Smoking makes the problem worse. This information is not intended to replace advice given to you by your health care provider. Make sure you discuss any questions you have with your health care provider. Document Revised: 07/22/2017 Document Reviewed: 09/13/2016 Elsevier Patient Education  2020 [employer].  

## 2020-04-17 ENCOUNTER — Telehealth: Payer: Self-pay

## 2020-04-17 NOTE — Telephone Encounter (Signed)
Spoke to pt's sister and confirmed appt for 04/21/20

## 2020-04-21 ENCOUNTER — Ambulatory Visit (INDEPENDENT_AMBULATORY_CARE_PROVIDER_SITE_OTHER): Payer: Medicare Other | Admitting: Nurse Practitioner

## 2020-04-21 ENCOUNTER — Encounter: Payer: Self-pay | Admitting: Nurse Practitioner

## 2020-04-21 ENCOUNTER — Other Ambulatory Visit: Payer: Self-pay

## 2020-04-21 VITALS — BP 132/86 | HR 71 | Temp 97.4°F | Resp 16 | Ht 67.0 in | Wt 194.6 lb

## 2020-04-21 DIAGNOSIS — Z85118 Personal history of other malignant neoplasm of bronchus and lung: Secondary | ICD-10-CM | POA: Diagnosis not present

## 2020-04-21 DIAGNOSIS — I679 Cerebrovascular disease, unspecified: Secondary | ICD-10-CM

## 2020-04-21 DIAGNOSIS — Z23 Encounter for immunization: Secondary | ICD-10-CM

## 2020-04-21 DIAGNOSIS — I10 Essential (primary) hypertension: Secondary | ICD-10-CM

## 2020-04-21 DIAGNOSIS — J449 Chronic obstructive pulmonary disease, unspecified: Secondary | ICD-10-CM | POA: Diagnosis not present

## 2020-04-21 DIAGNOSIS — J4489 Other specified chronic obstructive pulmonary disease: Secondary | ICD-10-CM

## 2020-04-21 DIAGNOSIS — R569 Unspecified convulsions: Secondary | ICD-10-CM

## 2020-04-21 DIAGNOSIS — E782 Mixed hyperlipidemia: Secondary | ICD-10-CM

## 2020-04-21 MED ORDER — PHENYTOIN SODIUM EXTENDED 100 MG PO CAPS: 400.0000 mg | ORAL_CAPSULE | Freq: Every day | ORAL | 1 refills | Status: DC

## 2020-04-21 MED ORDER — PNEUMOVAX 23 25 MCG/0.5ML IJ INJ: INJECTION | INTRAMUSCULAR | 0 refills | Status: DC

## 2020-04-21 NOTE — Progress Notes (Signed)
[facility] [place], [place] [address]  Internal MEDICINE  Office Visit Note  Patient Name: Derek Webb  [id number]  [id number]  Date of Service: 04/21/2020  Chief Complaint  Patient presents with  . Follow-up  . Gastroesophageal Reflux  . Hyperlipidemia  . Hypertension  . Quality Metric Gaps    tetnaus    The patient is here for routine follow up visit.  Blood pressure is mildly elevated today. This improved during the course of visit . States that he has taken blood pressure meds as prescribed. He does have history of cerebrovascular accident. He does not see vein and vascular.       Current Medication: Outpatient Encounter Medications as of 04/21/2020  Medication Sig  . aspirin 81 MG tablet Take 81 mg by mouth daily.  Marland Kitchen atorvastatin (LIPITOR) 80 MG tablet Take 1 tablet (80 mg total) by mouth daily.  . budesonide-formoterol (SYMBICORT) 160-4.5 MCG/ACT inhaler Inhale 1 puff into the lungs 2 (two) times daily.  . Cholecalciferol (VITAMIN D) 2000 UNITS CAPS Take by mouth. VITAMIN D 3 2000 IU  ONE SOFTGEL DAILY  --OTC  . clopidogrel (PLAVIX) 75 MG tablet Take 1 tablet (75 mg total) by mouth daily.  . enalapril (VASOTEC) 20 MG tablet Take 1 tablet (20 mg total) by mouth daily.  Marland Kitchen ezetimibe (ZETIA) 10 MG tablet Take 1 tablet (10 mg total) by mouth daily.  . OXYGEN Inhale into the lungs. At night  . pantoprazole (PROTONIX) 40 MG tablet Take 1 tablet (40 mg total) by mouth daily. IN AM  . phenytoin (DILANTIN) 100 MG ER capsule Take 4 capsules (400 mg total) by mouth at bedtime.  . [DISCONTINUED] phenytoin (DILANTIN) 100 MG ER capsule Take 4 capsules (400 mg total) by mouth at bedtime.  . pneumococcal 23 valent vaccine (PNEUMOVAX 23) 25 MCG/0.5ML injection Inject 0.60ml IM once   No facility-administered encounter medications on file as of 04/21/2020.    Surgical History: Past Surgical History:  Procedure Laterality Date  . CAROTID SURGERY 2009 -LEFT     . COLON POLYP EXCISION    . COLON SURGERY    . COLONOSCOPY WITH PROPOFOL N/A 04/18/2017   Procedure: COLONOSCOPY WITH PROPOFOL;  Surgeon: Manya Silvas, MD;  Location: [facility] ENDOSCOPY;  Service: Endoscopy;  Laterality: N/A;  . ESOPHAGOGASTRODUODENOSCOPY    . IR GENERIC HISTORICAL  01/21/2016   IR RADIOLOGIST EVAL & MGMT 01/21/2016 Aletta Edouard, MD [facility] INTERV RAD  . IR GENERIC HISTORICAL  10/22/2014   IR RADIOLOGIST EVAL & MGMT 10/22/2014 Aletta Edouard, MD [facility] INTERV RAD  . IR RADIOLOGIST EVAL & MGMT  02/02/2017  . IR RADIOLOGIST EVAL & MGMT  03/08/2018  . percutaneous biopsy      Medical History: Past Medical History:  Diagnosis Date  . Alcoholism ([place])   . COPD (chronic obstructive pulmonary disease) (HCC)    SEVERE COPD -OCCASIONALLY USES OXYGEN AT NIGHT-DOES NOT USE INHALERS ON REGULAR BASIS  . Elevated cholesterol   . GERD (gastroesophageal reflux disease)   . HOH (hard of hearing)   . Hypertension   . Lung cancer ([place])    LEFT UPPER LUNG CARCINOMA-NOT A CANDIDATE FOR SURGICAL RESECTION BECAUSE OF HIS COPD AND POOR CANDIDATE FOR RADIATION BECAUSE OF TRANSPORTATION PROBLEMS  . Seizures ([place])    CHRONIC DILATIN - PT STATES NO SEIZURES IN PAST COUPLE OF YEARS; PT STATES HIS SEIZURES CAUSED NUMBNESS OF ARMS AND HANDS AND NOT ABLE TO MOVE HIS ARMS  . Stroke (  [place])    2 TO 3 YRS AGO-AFFECTED HIS SPEECH--ABLE TO AMBULATE WITHOUT ASSIST AND DOES YARD WORK.  DECREASED HEARING IN BOTH EARS SINCE STROKE.  Marland Kitchen Vitamin D deficiency     Family History: Family History  Problem Relation Age of Onset  . Colon polyps Sister     Social History   Socioeconomic History  . Marital status: Single    Spouse name: Not on file  . Number of children: Not on file  . Years of education: Not on file  . Highest education level: Not on file  Occupational History  . Not on file  Tobacco Use  . Smoking status: Former Smoker    Packs/day: 1.00    Years: 35.00    Pack years: 35.00    Types:  Cigarettes    Quit date: 12/22/2011    Years since quitting: 8.3  . Smokeless tobacco: Never Used  Vaping Use  . Vaping Use: Never used  Substance and Sexual Activity  . Alcohol use: Yes    Comment: occosionally   . Drug use: No  . Sexual activity: Not on file  Other Topics Concern  . Not on file  Social History Narrative  . Not on file   Social Determinants of Health   Financial Resource Strain:   . Difficulty of Paying Living Expenses: Not on file  Food Insecurity:   . Worried About Charity fundraiser in the Last Year: Not on file  . Ran Out of Food in the Last Year: Not on file  Transportation Needs:   . Lack of Transportation (Medical): Not on file  . Lack of Transportation (Non-Medical): Not on file  Physical Activity:   . Days of Exercise per Week: Not on file  . Minutes of Exercise per Session: Not on file  Stress:   . Feeling of Stress : Not on file  Social Connections:   . Frequency of Communication with Friends and Family: Not on file  . Frequency of Social Gatherings with Friends and Family: Not on file  . Attends Religious Services: Not on file  . Active Member of Clubs or Organizations: Not on file  . Attends Archivist Meetings: Not on file  . Marital Status: Not on file  Intimate Partner Violence:   . Fear of Current or Ex-Partner: Not on file  . Emotionally Abused: Not on file  . Physically Abused: Not on file  . Sexually Abused: Not on file      Review of Systems  Constitutional: Negative for activity change, chills, fatigue and unexpected weight change.  HENT: Negative for congestion, postnasal drip, rhinorrhea, sneezing, sore throat and voice change.   Respiratory: Positive for wheezing. Negative for cough, chest tightness and shortness of breath.        Intermittent and well managed with current medication.   Cardiovascular: Negative for chest pain and palpitations.  Gastrointestinal: Negative for abdominal distention, abdominal pain,  constipation, diarrhea, nausea and vomiting.  Endocrine: Negative for cold intolerance, heat intolerance, polydipsia and polyuria.  Musculoskeletal: Negative for arthralgias, back pain, joint swelling, myalgias and neck pain.  Skin: Negative for rash.  Allergic/Immunologic: Negative for environmental allergies.  Neurological: Positive for seizures and headaches. Negative for dizziness, tremors and numbness.       Well managed.   Hematological: Negative for adenopathy. Does not bruise/bleed easily.  Psychiatric/Behavioral: Negative for behavioral problems (Depression), sleep disturbance and suicidal ideas. The patient is not nervous/anxious.     Today's Vitals  04/21/20 1003  BP: 132/86  Pulse: 71  Resp: 16  Temp: (!) 97.4 F (36.3 C)  SpO2: 97%  Weight: 194 lb 9.6 oz (88.3 kg)  Height: 5\' 7"  (1.702 m)   Body mass index is 30.48 kg/m.  Physical Exam Vitals and nursing note reviewed.  Constitutional:      General: He is not in acute distress.    Appearance: Normal appearance. He is well-developed. He is not diaphoretic.  HENT:     Head: Normocephalic and atraumatic.     Nose: Nose normal.     Mouth/Throat:     Pharynx: No oropharyngeal exudate.  Eyes:     Extraocular Movements: Extraocular movements intact.     Pupils: Pupils are equal, round, and reactive to light.  Neck:     Thyroid: No thyromegaly.     Vascular: No carotid bruit or JVD.     Trachea: No tracheal deviation.  Cardiovascular:     Rate and Rhythm: Normal rate and regular rhythm.     Pulses: Normal pulses.     Heart sounds: Normal heart sounds. No murmur heard.  No friction rub. No gallop.   Pulmonary:     Effort: Pulmonary effort is normal. No respiratory distress.     Breath sounds: Wheezing present. No rales.     Comments: Mild wheezing present throughout the lung fields. They clear with cough.  Chest:     Chest wall: No tenderness.  Abdominal:     Palpations: Abdomen is soft.  Musculoskeletal:         General: Normal range of motion.     Cervical back: Normal range of motion and neck supple.  Lymphadenopathy:     Cervical: No cervical adenopathy.  Skin:    General: Skin is warm and dry.  Neurological:     Mental Status: He is alert and oriented to person, place, and time. Mental status is at baseline.     Cranial Nerves: No cranial nerve deficit.  Psychiatric:        Mood and Affect: Mood normal.        Behavior: Behavior normal.        Thought Content: Thought content normal.        Judgment: Judgment normal.   Assessment/Plan: 1. Essential hypertension Stable. Continue bp medication as prescribed   2. Obstructive chronic bronchitis without exacerbation (HCC) Generally stable. Continue inhalers and respiratory medications as prescribed.   3. History of lung cancer Continue regular visits with pulmonology and oncology as scheduled.   4. Cerebrovascular disease Continue plavix as prescribed. Regular visits with vein and vascular as scheduled.   5. Mixed hyperlipidemia Continue atorvastatin and zetia as prescribed   6. Seizures (HCC) Continue dilantin 100mg  ER - taking four capsules at bedtime.  - phenytoin (DILANTIN) 100 MG ER capsule; Take 4 capsules (400 mg total) by mouth at bedtime.  Dispense: 450 capsule; Refill: 1  7. Need for vaccination against Streptococcus pneumoniae using pneumococcal conjugate vaccine 13 Prescription for pneumovax as prescribed  - pneumococcal 23 valent vaccine (PNEUMOVAX 23) 25 MCG/0.5ML injection; Inject 0.21ml IM once  Dispense: 0.5 mL; Refill: 0  General Counseling: Henley verbalizes understanding of the findings of todays visit and agrees with plan of treatment. I have discussed any further diagnostic evaluation that may be needed or ordered today. We also reviewed his medications today. he has been encouraged to call the office with any questions or concerns that should arise related to todays visit.   This  patient was seen by Leretha Pol FNP Collaboration with Dr Lavera Guise as a part of collaborative care agreement  Meds ordered this encounter  Medications  . pneumococcal 23 valent vaccine (PNEUMOVAX 23) 25 MCG/0.5ML injection    Sig: Inject 0.27ml IM once    Dispense:  0.5 mL    Refill:  0    Order Specific Question:   Supervising Provider    Answer:   Lavera Guise [[id number]]  . phenytoin (DILANTIN) 100 MG ER capsule    Sig: Take 4 capsules (400 mg total) by mouth at bedtime.    Dispense:  450 capsule    Refill:  1    Order Specific Question:   Supervising Provider    Answer:   Lavera Guise [[id number]]    Total time spent: 30 Minutes   Time spent includes review of chart, medications, test results, and follow up plan with the patient.      Dr Lavera Guise Internal medicine

## 2020-04-23 DIAGNOSIS — J449 Chronic obstructive pulmonary disease, unspecified: Secondary | ICD-10-CM | POA: Diagnosis not present

## 2020-05-23 DIAGNOSIS — J449 Chronic obstructive pulmonary disease, unspecified: Secondary | ICD-10-CM | POA: Diagnosis not present

## 2020-06-10 ENCOUNTER — Ambulatory Visit: Payer: Medicare Other | Admitting: Internal Medicine

## 2020-06-23 DIAGNOSIS — J449 Chronic obstructive pulmonary disease, unspecified: Secondary | ICD-10-CM | POA: Diagnosis not present

## 2020-07-03 ENCOUNTER — Encounter: Payer: Self-pay | Admitting: Nurse Practitioner

## 2020-07-03 ENCOUNTER — Ambulatory Visit (INDEPENDENT_AMBULATORY_CARE_PROVIDER_SITE_OTHER): Payer: Medicare Other | Admitting: Nurse Practitioner

## 2020-07-03 ENCOUNTER — Other Ambulatory Visit: Payer: Self-pay

## 2020-07-03 VITALS — BP 127/80 | HR 74 | Temp 98.1°F | Resp 16 | Ht 67.0 in | Wt 192.0 lb

## 2020-07-03 DIAGNOSIS — I639 Cerebral infarction, unspecified: Secondary | ICD-10-CM | POA: Diagnosis not present

## 2020-07-03 DIAGNOSIS — I700 Atherosclerosis of aorta: Secondary | ICD-10-CM | POA: Diagnosis not present

## 2020-07-03 DIAGNOSIS — I10 Essential (primary) hypertension: Secondary | ICD-10-CM | POA: Diagnosis not present

## 2020-07-03 DIAGNOSIS — Z0001 Encounter for general adult medical examination with abnormal findings: Secondary | ICD-10-CM | POA: Diagnosis not present

## 2020-07-03 DIAGNOSIS — E785 Hyperlipidemia, unspecified: Secondary | ICD-10-CM | POA: Diagnosis not present

## 2020-07-03 DIAGNOSIS — Z5181 Encounter for therapeutic drug level monitoring: Secondary | ICD-10-CM

## 2020-07-03 MED ORDER — EZETIMIBE 10 MG PO TABS: 10.0000 mg | ORAL_TABLET | Freq: Every day | ORAL | 3 refills | Status: DC

## 2020-07-03 MED ORDER — CLOPIDOGREL BISULFATE 75 MG PO TABS: 75.0000 mg | ORAL_TABLET | Freq: Every day | ORAL | 3 refills | Status: DC

## 2020-07-03 MED ORDER — ATORVASTATIN CALCIUM 80 MG PO TABS: 80.0000 mg | ORAL_TABLET | Freq: Every day | ORAL | 3 refills | Status: DC

## 2020-07-03 MED ORDER — ENALAPRIL MALEATE 20 MG PO TABS: 20.0000 mg | ORAL_TABLET | Freq: Every day | ORAL | 3 refills | Status: DC

## 2020-07-03 NOTE — Progress Notes (Signed)
[facility] [place], [place] [address]  Internal MEDICINE  Office Visit Note  Patient Name: Derek Webb  [id number]  [id number]  Date of Service: 07/27/2020   Pt is here for routine health maintenance examination  Chief Complaint  Patient presents with  . Medicare Wellness  . Gastroesophageal Reflux  . Hyperlipidemia  . Hypertension  . Quality Metric Gaps    flu  . controlled substance form    reviewed with PT     The patient is here for health maintenance exam. His blood pressure is well controlled. He has history of COPD, cerebrovascular disease and accident. He has also had both colon cancer and lung cancer. He sees his oncologist, pulmonologist, and GI specialist on routine basis. He is due to have routine, fasting labs checked and check of his dilantin levels. He is also due to have new carotid doppler and evaluation of abdominal vessels for atherosclerosis. He has no cobreathing is well managed with current medncerns or complaints today. States that his breathing is well managed.     Current Medication: Outpatient Encounter Medications as of 07/03/2020  Medication Sig  . aspirin 81 MG tablet Take 81 mg by mouth daily.  Marland Kitchen atorvastatin (LIPITOR) 80 MG tablet Take 1 tablet (80 mg total) by mouth daily.  . budesonide-formoterol (SYMBICORT) 160-4.5 MCG/ACT inhaler Inhale 1 puff into the lungs 2 (two) times daily.  . Cholecalciferol (VITAMIN D) 2000 UNITS CAPS Take by mouth. VITAMIN D 3 2000 IU  ONE SOFTGEL DAILY  --OTC  . clopidogrel (PLAVIX) 75 MG tablet Take 1 tablet (75 mg total) by mouth daily.  . enalapril (VASOTEC) 20 MG tablet Take 1 tablet (20 mg total) by mouth daily.  Marland Kitchen ezetimibe (ZETIA) 10 MG tablet Take 1 tablet (10 mg total) by mouth daily.  . OXYGEN Inhale into the lungs. At night  . pantoprazole (PROTONIX) 40 MG tablet Take 1 tablet (40 mg total) by mouth daily. IN AM  . phenytoin (DILANTIN) 100 MG ER capsule Take 4 capsules (400  mg total) by mouth at bedtime.  . pneumococcal 23 valent vaccine (PNEUMOVAX 23) 25 MCG/0.5ML injection Inject 0.35ml IM once  . [DISCONTINUED] atorvastatin (LIPITOR) 80 MG tablet Take 1 tablet (80 mg total) by mouth daily.  . [DISCONTINUED] clopidogrel (PLAVIX) 75 MG tablet Take 1 tablet (75 mg total) by mouth daily.  . [DISCONTINUED] enalapril (VASOTEC) 20 MG tablet Take 1 tablet (20 mg total) by mouth daily.  . [DISCONTINUED] ezetimibe (ZETIA) 10 MG tablet Take 1 tablet (10 mg total) by mouth daily.   No facility-administered encounter medications on file as of 07/03/2020.    Surgical History: Past Surgical History:  Procedure Laterality Date  . CAROTID SURGERY 2009 -LEFT    . COLON POLYP EXCISION    . COLON SURGERY    . COLONOSCOPY WITH PROPOFOL N/A 04/18/2017   Procedure: COLONOSCOPY WITH PROPOFOL;  Surgeon: Manya Silvas, MD;  Location: [facility] ENDOSCOPY;  Service: Endoscopy;  Laterality: N/A;  . ESOPHAGOGASTRODUODENOSCOPY    . IR GENERIC HISTORICAL  01/21/2016   IR RADIOLOGIST EVAL & MGMT 01/21/2016 Aletta Edouard, MD [facility] INTERV RAD  . IR GENERIC HISTORICAL  10/22/2014   IR RADIOLOGIST EVAL & MGMT 10/22/2014 Aletta Edouard, MD [facility] INTERV RAD  . IR RADIOLOGIST EVAL & MGMT  02/02/2017  . IR RADIOLOGIST EVAL & MGMT  03/08/2018  . percutaneous biopsy      Medical History: Past Medical History:  Diagnosis Date  . Alcoholism ([place])   .  COPD (chronic obstructive pulmonary disease) (HCC)    SEVERE COPD -OCCASIONALLY USES OXYGEN AT NIGHT-DOES NOT USE INHALERS ON REGULAR BASIS  . Elevated cholesterol   . GERD (gastroesophageal reflux disease)   . HOH (hard of hearing)   . Hypertension   . Lung cancer ([place])    LEFT UPPER LUNG CARCINOMA-NOT A CANDIDATE FOR SURGICAL RESECTION BECAUSE OF HIS COPD AND POOR CANDIDATE FOR RADIATION BECAUSE OF TRANSPORTATION PROBLEMS  . Seizures ([place])    CHRONIC DILATIN - PT STATES NO SEIZURES IN PAST COUPLE OF YEARS; PT STATES HIS SEIZURES CAUSED NUMBNESS  OF ARMS AND HANDS AND NOT ABLE TO MOVE HIS ARMS  . Stroke ([place])    2 TO 3 YRS AGO-AFFECTED HIS SPEECH--ABLE TO AMBULATE WITHOUT ASSIST AND DOES YARD WORK.  DECREASED HEARING IN BOTH EARS SINCE STROKE.  Marland Kitchen Vitamin D deficiency     Family History: Family History  Problem Relation Age of Onset  . Colon polyps Sister       Review of Systems  Constitutional: Negative for activity change, chills, fatigue and unexpected weight change.  HENT: Negative for congestion, postnasal drip, rhinorrhea, sneezing, sore throat and voice change.   Respiratory: Positive for shortness of breath and wheezing. Negative for cough and chest tightness.        Intermittent and well managed with current medication.   Cardiovascular: Negative for chest pain and palpitations.  Gastrointestinal: Negative for abdominal distention, abdominal pain, constipation, diarrhea, nausea and vomiting.  Endocrine: Negative for cold intolerance, heat intolerance, polydipsia and polyuria.  Genitourinary: Negative for dysuria, frequency and urgency.  Musculoskeletal: Negative for arthralgias, back pain, joint swelling, myalgias and neck pain.  Skin: Negative for rash.  Allergic/Immunologic: Negative for environmental allergies.  Neurological: Positive for seizures and headaches. Negative for dizziness, tremors and numbness.       Well managed.   Hematological: Negative for adenopathy. Does not bruise/bleed easily.  Psychiatric/Behavioral: Negative for behavioral problems (Depression), sleep disturbance and suicidal ideas. The patient is not nervous/anxious.      Today's Vitals   07/03/20 1355  BP: 127/80  Pulse: 74  Resp: 16  Temp: 98.1 F (36.7 C)  SpO2: 96%  Weight: 192 lb (87.1 kg)  Height: 5\' 7"  (1.702 m)   Body mass index is 30.07 kg/m.  Physical Exam Vitals and nursing note reviewed.  Constitutional:      General: He is not in acute distress.    Appearance: Normal appearance. He is well-developed. He is not  diaphoretic.  HENT:     Head: Normocephalic and atraumatic.     Nose: Nose normal.     Mouth/Throat:     Pharynx: No oropharyngeal exudate.  Eyes:     Pupils: Pupils are equal, round, and reactive to light.  Neck:     Thyroid: No thyromegaly.     Vascular: No carotid bruit or JVD.     Trachea: No tracheal deviation.  Cardiovascular:     Rate and Rhythm: Normal rate and regular rhythm.     Pulses: Normal pulses.     Heart sounds: Normal heart sounds. No murmur heard.  No friction rub. No gallop.   Pulmonary:     Effort: Pulmonary effort is normal. No respiratory distress.     Breath sounds: Normal breath sounds. No wheezing or rales.  Chest:     Chest wall: No tenderness.  Abdominal:     General: Bowel sounds are normal.     Palpations: Abdomen is soft.  Tenderness: There is no abdominal tenderness.  Musculoskeletal:        General: Normal range of motion.     Cervical back: Normal range of motion and neck supple.  Lymphadenopathy:     Cervical: No cervical adenopathy.  Skin:    General: Skin is warm and dry.  Neurological:     Mental Status: He is alert and oriented to person, place, and time. Mental status is at baseline.     Cranial Nerves: No cranial nerve deficit.  Psychiatric:        Mood and Affect: Mood normal.        Behavior: Behavior normal.        Thought Content: Thought content normal.        Judgment: Judgment normal.    Depression screen [facility] 2/9 07/03/2020 04/21/2020 12/20/2019 07/02/2019 03/01/2019  Decreased Interest 0 0 0 0 0  Down, Depressed, Hopeless 0 0 0 0 0  PHQ - 2 Score 0 0 0 0 0    Functional Status Survey: Is the patient deaf or have difficulty hearing?: Yes Does the patient have difficulty seeing, even when wearing glasses/contacts?: No Does the patient have difficulty concentrating, remembering, or making decisions?: No Does the patient have difficulty walking or climbing stairs?: No Does the patient have difficulty dressing or  bathing?: No Does the patient have difficulty doing errands alone such as visiting a doctor's office or shopping?: No  MMSE - Mini Mental State Exam 07/03/2020 07/20/2019  Not completed: - Unable to complete  Orientation to time 5 -  Orientation to Place 5 -  Registration 3 -  Attention/ Calculation 5 -  Recall 3 -  Language- name 2 objects 2 -  Language- repeat 1 -  Language- follow 3 step command 3 -  Language- read & follow direction 1 -  Write a sentence 1 -  Copy design 1 -  Total score 30 -    Fall Risk  07/03/2020 04/21/2020 12/20/2019 07/02/2019 03/01/2019  Falls in the past year? 0 0 0 0 0    Assessment/Plan: 1. Encounter for general adult medical examination with abnormal findings Annual health maintenance exam. Order slip given to have routine, fasting labs drawn.   2. Essential hypertension Stable. Continue bp medication as prescribed  - enalapril (VASOTEC) 20 MG tablet; Take 1 tablet (20 mg total) by mouth daily.  Dispense: 90 tablet; Refill: 3 - ezetimibe (ZETIA) 10 MG tablet; Take 1 tablet (10 mg total) by mouth daily.  Dispense: 90 tablet; Refill: 3  3. Hyperlipidemia, unspecified hyperlipidemia type Check fasting lipid panel. Adjust atorvastatin and/or zetia as indicated.  - atorvastatin (LIPITOR) 80 MG tablet; Take 1 tablet (80 mg total) by mouth daily.  Dispense: 90 tablet; Refill: 3 - ezetimibe (ZETIA) 10 MG tablet; Take 1 tablet (10 mg total) by mouth daily.  Dispense: 90 tablet; Refill: 3  4. Atherosclerosis of aorta (HCC) Continue plavix as prescribed. Get new carotid doppler and ultrasound of abdominal vasculature for further evaluation.  - clopidogrel (PLAVIX) 75 MG tablet; Take 1 tablet (75 mg total) by mouth daily.  Dispense: 90 tablet; Refill: 3 - US Carotid Bilateral; Future - VAS US AORTA/IVC/ILIACS; Future  5. Cerebrovascular accident (CVA), unspecified mechanism ([place]) Will get carotid doppler for further evaluation.  - US Carotid Bilateral;  Future  6. Encounter for therapeutic drug level monitoring Check dilantin level with routine labs. Adjust dosing as indicated.   General Counseling: brannon levene understanding of the findings of todays visit  and agrees with plan of treatment. I have discussed any further diagnostic evaluation that may be needed or ordered today. We also reviewed his medications today. he has been encouraged to call the office with any questions or concerns that should arise related to todays visit.    Counseling:  This patient was seen by Leretha Pol FNP Collaboration with Dr Lavera Guise as a part of collaborative care agreement  Orders Placed This Encounter  Procedures  . US Carotid Bilateral  . VAS US AORTA/IVC/ILIACS    Meds ordered this encounter  Medications  . atorvastatin (LIPITOR) 80 MG tablet    Sig: Take 1 tablet (80 mg total) by mouth daily.    Dispense:  90 tablet    Refill:  3    Order Specific Question:   Supervising Provider    Answer:   Lavera Guise [[id number]]  . clopidogrel (PLAVIX) 75 MG tablet    Sig: Take 1 tablet (75 mg total) by mouth daily.    Dispense:  90 tablet    Refill:  3    Order Specific Question:   Supervising Provider    Answer:   Lavera Guise [[id number]]  . enalapril (VASOTEC) 20 MG tablet    Sig: Take 1 tablet (20 mg total) by mouth daily.    Dispense:  90 tablet    Refill:  3    Order Specific Question:   Supervising Provider    Answer:   Lavera Guise [[id number]]  . ezetimibe (ZETIA) 10 MG tablet    Sig: Take 1 tablet (10 mg total) by mouth daily.    Dispense:  90 tablet    Refill:  3    Order Specific Question:   Supervising Provider    Answer:   Lavera Guise [[id number]]    Total time spent: 72 Minutes  Time spent includes review of chart, medications, test results, and follow up plan with the patient.     Lavera Guise, MD  Internal Medicine

## 2020-07-23 DIAGNOSIS — J449 Chronic obstructive pulmonary disease, unspecified: Secondary | ICD-10-CM | POA: Diagnosis not present

## 2020-07-25 ENCOUNTER — Other Ambulatory Visit: Payer: Medicare Other

## 2020-08-01 ENCOUNTER — Ambulatory Visit: Payer: Medicare Other

## 2020-08-01 ENCOUNTER — Other Ambulatory Visit: Payer: Medicare Other

## 2020-08-01 ENCOUNTER — Other Ambulatory Visit: Payer: Self-pay

## 2020-08-01 DIAGNOSIS — I700 Atherosclerosis of aorta: Secondary | ICD-10-CM

## 2020-08-23 DIAGNOSIS — J449 Chronic obstructive pulmonary disease, unspecified: Secondary | ICD-10-CM | POA: Diagnosis not present

## 2020-08-27 ENCOUNTER — Other Ambulatory Visit: Payer: Medicare Other

## 2020-08-29 ENCOUNTER — Other Ambulatory Visit: Payer: Self-pay

## 2020-08-29 ENCOUNTER — Ambulatory Visit (INDEPENDENT_AMBULATORY_CARE_PROVIDER_SITE_OTHER): Payer: Medicare Other

## 2020-08-29 ENCOUNTER — Other Ambulatory Visit: Payer: Medicare Other

## 2020-08-29 DIAGNOSIS — I6310 Cerebral infarction due to embolism of unspecified precerebral artery: Secondary | ICD-10-CM

## 2020-08-29 DIAGNOSIS — I700 Atherosclerosis of aorta: Secondary | ICD-10-CM

## 2020-08-29 DIAGNOSIS — I639 Cerebral infarction, unspecified: Secondary | ICD-10-CM | POA: Diagnosis not present

## 2020-09-01 DIAGNOSIS — H35013 Changes in retinal vascular appearance, bilateral: Secondary | ICD-10-CM | POA: Diagnosis not present

## 2020-09-01 DIAGNOSIS — H25013 Cortical age-related cataract, bilateral: Secondary | ICD-10-CM | POA: Diagnosis not present

## 2020-09-01 DIAGNOSIS — H2513 Age-related nuclear cataract, bilateral: Secondary | ICD-10-CM | POA: Diagnosis not present

## 2020-09-08 NOTE — Procedures (Signed)
[place], [place] [address]  DATE OF SERVICE: August 29, 2020  CAROTID DOPPLER INTERPRETATION:  Bilateral Carotid Ultrsasound and Color Doppler Examination was performed. The RIGHT CCA shows mild plaque in the vessel. The LEFT CCA shows mild plaque in the vessel. There was no significant intimal thickening noted in the RIGHT carotid artery. There was no significant intimal thickening in the LEFT carotid artery.  The RIGHT CCA shows peak systolic velocity of 71 cm per second. The end diastolic velocity is 17 cm per second on the RIGHT side. The RIGHT ICA shows peak systolic velocity of 52 per second. RIGHT sided ICA end diastolic velocity is 19 cm per second. The RIGHT ECA shows a peak systolic velocity of 51 cm per second. The ICA/CCA ratio is calculated to be 0.70. This suggests less than 50% stenosis. The Vertebral Artery shows antegrade flow.  The LEFT CCA shows peak systolic velocity of 67 cm per second. The end diastolic velocity is 16 cm per second on the LEFT side. The LEFT ICA shows peak systolic velocity of 55 per second. LEFT sided ICA end diastolic velocity is 23 cm per second. The LEFT ECA shows a peak systolic velocity of 46 cm per second. The ICA/CCA ratio is calculated to be 0.82. This suggests less than 50% stenosis. The Vertebral Artery shows antegrade flow.   Impression:    The RIGHT CAROTID shows less than 50% stenosis. The LEFT CAROTID shows less than 50% stenosis.  There is mild plaque formation noted on the LEFT and mild on the RIGHT  side. Consider a repeat Carotid doppler if clinical situation and symptoms warrant in 6-12 months. Patient should be encouraged to change lifestyles such as smoking cessation, regular exercise and dietary modification. Use of statins in the right clinical setting and ASA is encouraged.  Allyne Gee, MD [facility] Pulmonary Critical Care Medicine

## 2020-09-18 ENCOUNTER — Other Ambulatory Visit: Payer: Self-pay

## 2020-09-18 DIAGNOSIS — K219 Gastro-esophageal reflux disease without esophagitis: Secondary | ICD-10-CM

## 2020-09-18 MED ORDER — PANTOPRAZOLE SODIUM 40 MG PO TBEC: 40.0000 mg | DELAYED_RELEASE_TABLET | Freq: Every day | ORAL | 3 refills | Status: DC

## 2020-09-23 DIAGNOSIS — J449 Chronic obstructive pulmonary disease, unspecified: Secondary | ICD-10-CM | POA: Diagnosis not present

## 2020-09-26 ENCOUNTER — Ambulatory Visit: Payer: Medicare Other | Admitting: Hospice and Palliative Medicine

## 2020-10-06 ENCOUNTER — Ambulatory Visit: Payer: Medicare Other | Admitting: Hospice and Palliative Medicine

## 2020-10-13 ENCOUNTER — Other Ambulatory Visit: Payer: Self-pay | Admitting: Nurse Practitioner

## 2020-10-13 DIAGNOSIS — E756 Lipid storage disorder, unspecified: Secondary | ICD-10-CM | POA: Diagnosis not present

## 2020-10-13 DIAGNOSIS — T420X1A Poisoning by hydantoin derivatives, accidental (unintentional), initial encounter: Secondary | ICD-10-CM | POA: Diagnosis not present

## 2020-10-13 DIAGNOSIS — R5381 Other malaise: Secondary | ICD-10-CM | POA: Diagnosis not present

## 2020-10-13 DIAGNOSIS — E079 Disorder of thyroid, unspecified: Secondary | ICD-10-CM | POA: Diagnosis not present

## 2020-10-13 DIAGNOSIS — E0781 Sick-euthyroid syndrome: Secondary | ICD-10-CM | POA: Diagnosis not present

## 2020-10-13 DIAGNOSIS — Z01812 Encounter for preprocedural laboratory examination: Secondary | ICD-10-CM | POA: Diagnosis not present

## 2020-10-14 LAB — COMPREHENSIVE METABOLIC PANEL
ALT: 21 IU/L (ref 0–44)
AST: 25 IU/L (ref 0–40)
Albumin/Globulin Ratio: 1.4 (ref 1.2–2.2)
Albumin: 4.2 g/dL (ref 3.8–4.8)
Alkaline Phosphatase: 148 IU/L — ABNORMAL HIGH (ref 44–121)
BUN/Creatinine Ratio: 7 — ABNORMAL LOW (ref 10–24)
BUN: 5 mg/dL — ABNORMAL LOW (ref 8–27)
Bilirubin Total: <0.2 mg/dL (ref 0.0–1.2)
CO2: 24 mmol/L (ref 20–29)
Calcium: 9.8 mg/dL (ref 8.6–10.2)
Chloride: 100 mmol/L (ref 96–106)
Creatinine, Ser: 0.74 mg/dL — ABNORMAL LOW (ref 0.76–1.27)
GFR calc Af Amer: 110 mL/min/{1.73_m2} (ref >59)
GFR calc non Af Amer: 95 mL/min/{1.73_m2} (ref >59)
Globulin, Total: 2.9 g/dL (ref 1.5–4.5)
Glucose: 83 mg/dL (ref 65–99)
Potassium: 4.6 mmol/L (ref 3.5–5.2)
Sodium: 140 mmol/L (ref 134–144)
Total Protein: 7.1 g/dL (ref 6.0–8.5)

## 2020-10-14 LAB — PHENYTOIN LEVEL, TOTAL: Phenytoin (Dilantin), Serum: 7.8 ug/mL — ABNORMAL LOW (ref 10.0–20.0)

## 2020-10-14 LAB — CBC
Hematocrit: 39.9 % (ref 37.5–51.0)
Hemoglobin: 13.7 g/dL (ref 13.0–17.7)
MCH: 31.3 pg (ref 26.6–33.0)
MCHC: 34.3 g/dL (ref 31.5–35.7)
MCV: 91 fL (ref 79–97)
Platelets: 355 10*3/uL (ref 150–450)
RBC: 4.38 x10E6/uL (ref 4.14–5.80)
RDW: 13.1 % (ref 11.6–15.4)
WBC: 5.3 10*3/uL (ref 3.4–10.8)

## 2020-10-14 LAB — PSA: Prostate Specific Ag, Serum: 3.1 ng/mL (ref 0.0–4.0)

## 2020-10-14 LAB — LIPID PANEL WITH LDL/HDL RATIO
Cholesterol, Total: 171 mg/dL (ref 100–199)
HDL: 64 mg/dL (ref >39)
LDL Chol Calc (NIH): 80 mg/dL (ref 0–99)
LDL/HDL Ratio: 1.3 ratio (ref 0.0–3.6)
Triglycerides: 160 mg/dL — ABNORMAL HIGH (ref 0–149)
VLDL Cholesterol Cal: 27 mg/dL (ref 5–40)

## 2020-10-14 LAB — TSH: TSH: 1.69 u[IU]/mL (ref 0.450–4.500)

## 2020-10-14 LAB — T4, FREE: Free T4: 1.08 ng/dL (ref 0.82–1.77)

## 2020-10-16 ENCOUNTER — Other Ambulatory Visit: Payer: Self-pay

## 2020-10-16 ENCOUNTER — Encounter: Payer: Self-pay | Admitting: Hospice and Palliative Medicine

## 2020-10-16 ENCOUNTER — Ambulatory Visit (INDEPENDENT_AMBULATORY_CARE_PROVIDER_SITE_OTHER): Payer: Medicare Other | Admitting: Hospice and Palliative Medicine

## 2020-10-16 VITALS — BP 132/82 | HR 89 | Temp 97.5°F | Resp 16 | Ht 67.0 in | Wt 189.5 lb

## 2020-10-16 DIAGNOSIS — I10 Essential (primary) hypertension: Secondary | ICD-10-CM | POA: Diagnosis not present

## 2020-10-16 DIAGNOSIS — I700 Atherosclerosis of aorta: Secondary | ICD-10-CM

## 2020-10-16 DIAGNOSIS — Z85118 Personal history of other malignant neoplasm of bronchus and lung: Secondary | ICD-10-CM | POA: Diagnosis not present

## 2020-10-16 DIAGNOSIS — Z8669 Personal history of other diseases of the nervous system and sense organs: Secondary | ICD-10-CM | POA: Diagnosis not present

## 2020-10-16 DIAGNOSIS — Z8673 Personal history of transient ischemic attack (TIA), and cerebral infarction without residual deficits: Secondary | ICD-10-CM | POA: Diagnosis not present

## 2020-10-16 DIAGNOSIS — R569 Unspecified convulsions: Secondary | ICD-10-CM

## 2020-10-16 MED ORDER — PHENYTOIN SODIUM EXTENDED 100 MG PO CAPS: 400.0000 mg | ORAL_CAPSULE | Freq: Every day | ORAL | 1 refills | Status: DC

## 2020-10-16 NOTE — Progress Notes (Signed)
[facility] [place],  [address]  Internal MEDICINE  Office Visit Note  Patient Name: Derek Webb  [id number]  [id number]  Date of Service: 10/19/2020  Chief Complaint  Patient presents with  . Follow-up  . Gastroesophageal Reflux  . Hyperlipidemia  . 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 aspirin 81 MG tablet Take 81 mg by mouth daily.  Marland Kitchen atorvastatin (LIPITOR) 80 MG tablet Take 1 tablet (80 mg total) by mouth daily.  . budesonide-formoterol (SYMBICORT) 160-4.5 MCG/ACT inhaler Inhale 1 puff into the lungs 2 (two) times daily.  . Cholecalciferol (VITAMIN D) 2000 UNITS CAPS Take by mouth. VITAMIN D 3 2000 IU  ONE SOFTGEL DAILY  --OTC  . clopidogrel (PLAVIX) 75 MG tablet Take 1 tablet (75 mg total) by mouth daily.  . enalapril (VASOTEC) 20 MG tablet Take 1 tablet (20 mg total) by mouth daily.  Marland Kitchen ezetimibe (ZETIA) 10 MG tablet Take 1 tablet (10 mg total) by mouth daily.  . OXYGEN Inhale into the lungs. At night  . pantoprazole (PROTONIX) 40 MG tablet Take 1 tablet (40 mg total) by mouth daily. IN AM  . pneumococcal 23 valent vaccine (PNEUMOVAX 23) 25 MCG/0.5ML injection Inject 0.17ml IM once  . [DISCONTINUED] phenytoin (DILANTIN) 100 MG ER  capsule Take 4 capsules (400 mg total) by mouth at bedtime.  . phenytoin (DILANTIN) 100 MG ER capsule Take 4 capsules (400 mg total) by mouth at bedtime.   No facility-administered encounter medications on file as of 10/16/2020.    Surgical History: Past Surgical History:  Procedure Laterality Date  . CAROTID SURGERY 2009 -LEFT    . COLON POLYP EXCISION    . COLON SURGERY    . COLONOSCOPY WITH PROPOFOL N/A 04/18/2017   Procedure: COLONOSCOPY WITH PROPOFOL;  Surgeon: Manya Silvas, MD;  Location[facility] ENDOSCOPY;  Service: Endoscopy;  Laterality: N/A;  . ESOPHAGOGASTRODUODENOSCOPY    . IR GENERIC HISTORICAL  01/21/2016   IR RADIOLOGIST EVAL & MGMT 01/21/2016 Aletta Edouard, MD [facility] INTERV RAD  . IR GENERIC HISTORICAL  10/22/2014   IR RADIOLOGIST EVAL & MGMT 10/22/2014 Aletta Edouard, MD [facility] INTERV RAD  . IR RADIOLOGIST EVAL & MGMT  02/02/2017  . IR RADIOLOGIST EVAL & MGMT  03/08/2018  . percutaneous biopsy      Medical History: Past Medical History:  Diagnosis Date  . Alcoholism ([place])   . COPD (chronic obstructive pulmonary disease) (HCC)    SEVERE COPD -OCCASIONALLY USES OXYGEN AT NIGHT-DOES NOT USE INHALERS ON REGULAR BASIS  . Elevated cholesterol   . GERD (gastroesophageal reflux disease)   . HOH (hard of hearing)   . Hypertension   . Lung cancer ([place])    LEFT UPPER LUNG CARCINOMA-NOT A CANDIDATE FOR SURGICAL RESECTION BECAUSE OF HIS COPD AND POOR CANDIDATE FOR RADIATION BECAUSE OF TRANSPORTATION PROBLEMS  . Seizures ([place])    CHRONIC DILATIN -  PT STATES NO SEIZURES IN PAST COUPLE OF YEARS; PT STATES HIS SEIZURES CAUSED NUMBNESS OF ARMS AND HANDS AND NOT ABLE TO MOVE HIS ARMS  . Stroke ([place])    2 TO 3 YRS AGO-AFFECTED HIS SPEECH--ABLE TO AMBULATE WITHOUT ASSIST AND DOES YARD WORK.  DECREASED HEARING IN BOTH EARS SINCE STROKE.  Marland Kitchen Vitamin D deficiency     Family History: Family History  Problem Relation Age of Onset  . Colon polyps Sister     Social History    Socioeconomic History  . Marital status: Single    Spouse name: Not on file  . Number of children: Not on file  . Years of education: Not on file  . Highest education level: Not on file  Occupational History  . Not on file  Tobacco Use  . Smoking status: Former Smoker    Packs/day: 1.00    Years: 35.00    Pack years: 35.00    Types: Cigarettes    Quit date: 12/22/2011    Years since quitting: 8.8  . Smokeless tobacco: Never Used  Vaping Use  . Vaping Use: Never used  Substance and Sexual Activity  . Alcohol use: Yes    Comment: occosionally   . Drug use: No  . Sexual activity: Not on file  Other Topics Concern  . Not on file  Social History Narrative  . Not on file   Social Determinants of Health   Financial Resource Strain: Not on file  Food Insecurity: Not on file  Transportation Needs: Not on file  Physical Activity: Not on file  Stress: Not on file  Social Connections: Not on file  Intimate Partner Violence: Not on file      Review of Systems  Constitutional: Negative for chills, fatigue and unexpected weight change.  HENT: Negative for congestion, postnasal drip, rhinorrhea, sneezing and sore throat.   Eyes: Negative for redness.  Respiratory: Negative for cough, chest tightness and shortness of breath.   Cardiovascular: Negative for chest pain and palpitations.  Gastrointestinal: Negative for abdominal pain, constipation, diarrhea, nausea and vomiting.  Genitourinary: Negative for dysuria and frequency.  Musculoskeletal: Positive for gait problem. Negative for arthralgias, back pain, joint swelling and neck pain.  Skin: Negative for rash.  Neurological: Positive for speech difficulty. Negative for tremors and numbness.  Hematological: Negative for adenopathy. Does not bruise/bleed easily.  Psychiatric/Behavioral: Negative for behavioral problems (Depression), sleep disturbance and suicidal ideas. The patient is not nervous/anxious.     Vital Signs: BP  132/82 Comment: 140/83  Pulse 89   Temp (!) 97.5 F (36.4 C)   Resp 16   Ht 5\' 7"  (1.702 m)   Wt 189 lb 8 oz (86 kg)   SpO2 97%   BMI 29.68 kg/m     Assessment/Plan: 1. History of seizure disorder Discussed importance of medication adherence, difficult to assess current adherence, discussed with sister helping with medications Advised to adhere to prescribed therapy and will repeat levels - phenytoin (DILANTIN) 100 MG ER capsule; Take 4 capsules (400 mg total) by mouth at bedtime.  Dispense: 450 capsule; Refill: 1 - Comprehensive Metabolic Panel (CMET) - Dilantin (Phenytoin) level, total  2. History of CVA (cerebrovascular accident) Stable, on statin and ASA, consider follow-up with neurology as it has been several years  3. Essential hypertension BP and HR well controlled, continue current therapy  4. History of lung cancer Followed by pulmonology--has not had recent follow-up with oncology  5. Atherosclerosis of aorta (HCC) Continue statin and  clopidogrel  General Counseling: ashlee bewley understanding of the findings of todays visit and agrees with plan of treatment. I have discussed any further diagnostic evaluation that may be needed or ordered today. We also reviewed his medications today. he has been encouraged to call the office with any questions or concerns that should arise related to todays visit.    Orders Placed This Encounter  Procedures  . Comprehensive Metabolic Panel (CMET)  . Dilantin (Phenytoin) level, total    Meds ordered this encounter  Medications  . phenytoin (DILANTIN) 100 MG ER capsule    Sig: Take 4 capsules (400 mg total) by mouth at bedtime.    Dispense:  450 capsule    Refill:  1    Time spent:30 Minutes Time spent includes review of chart, medications, test results and follow-up plan with the patient.  This patient was seen by Theodoro Grist AGNP-C in Collaboration with Dr Lavera Guise as a part of collaborative care agreement      Tanna Furry. Shakerra Red AGNP-C Internal medicine

## 2020-10-19 ENCOUNTER — Encounter: Payer: Self-pay | Admitting: Hospice and Palliative Medicine

## 2020-10-21 DIAGNOSIS — J449 Chronic obstructive pulmonary disease, unspecified: Secondary | ICD-10-CM | POA: Diagnosis not present

## 2020-10-28 ENCOUNTER — Ambulatory Visit: Payer: Medicare Other | Admitting: Internal Medicine

## 2020-11-07 ENCOUNTER — Telehealth: Payer: Self-pay | Admitting: Internal Medicine

## 2020-11-07 NOTE — Progress Notes (Signed)
  Chronic Care Management   Outreach Note  11/07/2020 Name: Derek Webb MRN: [id number] DOB: 1950-08-27  Referred by: Lavera Guise, MD Reason for referral : No chief complaint on file.   An unsuccessful telephone outreach was attempted today. The patient was referred to the pharmacist for assistance with care management and care coordination.   Follow Up Plan:   Carley Perdue [employer] Scheduler

## 2020-11-11 ENCOUNTER — Telehealth: Payer: Self-pay

## 2020-11-11 NOTE — Telephone Encounter (Signed)
LMOM. Pt called yesterday asking about someone who had reached out to discuss meds. Pt needs to know that they are called [employer] [facility] and pt can call them back and discuss meds and that [employer] is a 3rd party that is working on behalf of [employer].

## 2020-11-12 ENCOUNTER — Telehealth: Payer: Self-pay | Admitting: Internal Medicine

## 2020-11-12 NOTE — Progress Notes (Signed)
  Chronic Care Management   Outreach Note  11/12/2020 Name: Derek Webb MRN: [id number] DOB: 1950/11/21  Referred by: Lavera Guise, MD Reason for referral : No chief complaint on file.   A second unsuccessful telephone outreach was attempted today. The patient was referred to pharmacist for assistance with care management and care coordination.  Follow Up Plan:   Carley Perdue [employer] Scheduler

## 2020-11-14 ENCOUNTER — Telehealth: Payer: Self-pay | Admitting: Internal Medicine

## 2020-11-14 NOTE — Progress Notes (Signed)
  Chronic Care Management   Note  11/14/2020 Name: Derek Webb MRN: [id number] DOB: 09-Dec-1950  Derek Webb is a 70 y.o. year old male who is a primary care patient of Lavera Guise, MD. I reached out to [name] by phone today in response to a referral sent by Derek Webb's PCP, Lavera Guise, MD.   Derek Webb was given information about Chronic Care Management services today including:  1. CCM service includes personalized support from designated clinical staff supervised by his physician, including individualized plan of care and coordination with other care providers 2. 24/7 contact phone numbers for assistance for urgent and routine care needs. 3. Service will only be billed when office clinical staff spend 20 minutes or more in a month to coordinate care. 4. Only one practitioner may furnish and bill the service in a calendar month. 5. The patient may stop CCM services at any time (effective at the end of the month) by phone call to the office staff.   Patient agreed to services and verbal consent obtained.   Follow up plan:   Derek Webb [employer] Scheduler

## 2020-11-21 DIAGNOSIS — J449 Chronic obstructive pulmonary disease, unspecified: Secondary | ICD-10-CM | POA: Diagnosis not present

## 2020-11-24 NOTE — Progress Notes (Deleted)
Chronic Care Management Pharmacy Note  11/24/2020 Name:  DYWANE PERUSKI MRN:  [id number] DOB:  05/20/1951  Subjective: Derek Webb is an 70 y.o. year old male who is a primary patient of Humphrey Rolls Timoteo Gaul, MD.  The CCM team was consulted for assistance with disease management and care coordination needs.    Engaged with patient by telephone for initial visit in response to provider referral for pharmacy case management and/or care coordination services.   Consent to Services:  The patient was given the following information about Chronic Care Management services today, agreed to services, and gave verbal consent: 1. CCM service includes personalized support from designated clinical staff supervised by the primary care provider, including individualized plan of care and coordination with other care providers 2. 24/7 contact phone numbers for assistance for urgent and routine care needs. 3. Service will only be billed when office clinical staff spend 20 minutes or more in a month to coordinate care. 4. Only one practitioner may furnish and bill the service in a calendar month. 5.The patient may stop CCM services at any time (effective at the end of the month) by phone call to the office staff. 6. The patient will be responsible for cost sharing (co-pay) of up to 20% of the service fee (after annual deductible is met). Patient agreed to services and consent obtained.  Patient Care Team: Lavera Guise, MD as PCP - General (Internal Medicine) Edythe Clarity, [facility] as Pharmacist (Pharmacist)  Recent office visits: 10/16/20 [place]) - difficulty with medication adherence. No changes to medication, suggest sister help with medication administration.  04/21/20 ([place]) - No med changes Recent consult visits: None recent  Hospital visits: None in previous 6 months  Objective:  Lab Results  Component Value Date   CREATININE 0.74 (L) 10/13/2020   BUN 5 (L) 10/13/2020   GFRNONAA 95 10/13/2020    GFRAA 110 10/13/2020   NA 140 10/13/2020   K 4.6 10/13/2020   CALCIUM 9.8 10/13/2020   CO2 24 10/13/2020   GLUCOSE 83 10/13/2020    No results found for: HGBA1C, FRUCTOSAMINE, GFR, MICROALBUR  Last diabetic Eye exam: No results found for: HMDIABEYEEXA  Last diabetic Foot exam: No results found for: HMDIABFOOTEX   Lab Results  Component Value Date   CHOL 171 10/13/2020   HDL 64 10/13/2020   LDLCALC 80 10/13/2020   TRIG 160 (H) 10/13/2020    Hepatic Function Latest Ref Rng & Units 10/13/2020 08/06/2019  Total Protein 6.0 - 8.5 g/dL 7.1 7.3  Albumin 3.8 - 4.8 g/dL 4.2 4.1  AST 0 - 40 IU/L 25 23  ALT 0 - 44 IU/L 21 38  Alk Phosphatase 44 - 121 IU/L 148(H) 135(H)  Total Bilirubin 0.0 - 1.2 mg/dL <0.2 <0.2    Lab Results  Component Value Date/Time   TSH 1.690 10/13/2020 02:12 PM   TSH 1.990 08/06/2019 08:06 AM   FREET4 1.08 10/13/2020 02:12 PM   FREET4 0.93 08/06/2019 08:06 AM    CBC Latest Ref Rng & Units 10/13/2020 08/06/2019 05/06/2012  WBC 3.4 - 10.8 x10E3/uL 5.3 5.1 8.3  Hemoglobin 13.0 - 17.7 g/dL 13.7 14.3 12.9(L)  Hematocrit 37.5 - 51.0 % 39.9 42.2 38.1(L)  Platelets 150 - 450 x10E3/uL 355 347 357    No results found for: VD25OH  Clinical ASCVD: {YES/NO:21197} The ASCVD Risk score Mikey Bussing DC Jr., et al., 2013) failed to calculate for the following reasons:   The patient has a prior MI or stroke  diagnosis    Depression screen [facility] 2/9 10/16/2020 07/03/2020 04/21/2020  Decreased Interest 0 0 0  Down, Depressed, Hopeless 0 0 0  PHQ - 2 Score 0 0 0     ***Other: (CHADS2VASc if Afib, MMRC or CAT for COPD, ACT, DEXA)  Social History   Tobacco Use  Smoking Status Former Smoker  . Packs/day: 1.00  . Years: 35.00  . Pack years: 35.00  . Types: Cigarettes  . Quit date: 12/22/2011  . Years since quitting: 8.9  Smokeless Tobacco Never Used   BP Readings from Last 3 Encounters:  10/16/20 132/82  07/03/20 127/80  04/21/20 132/86   Pulse Readings from Last 3  Encounters:  10/16/20 89  07/03/20 74  04/21/20 71   Wt Readings from Last 3 Encounters:  10/16/20 189 lb 8 oz (86 kg)  07/03/20 192 lb (87.1 kg)  04/21/20 194 lb 9.6 oz (88.3 kg)   BMI Readings from Last 3 Encounters:  10/16/20 29.68 kg/m  07/03/20 30.07 kg/m  04/21/20 30.48 kg/m    Assessment/Interventions: Review of patient past medical history, allergies, medications, health status, including review of consultants reports, laboratory and other test data, was performed as part of comprehensive evaluation and provision of chronic care management services.   SDOH:  (Social Determinants of Health) assessments and interventions performed: {yes/no:20286}  SDOH Screenings   Alcohol Screen: Low Risk   . Last Alcohol Screening Score (AUDIT): 2  Depression (PHQ2-9): Low Risk   . PHQ-2 Score: 0  Financial Resource Strain: Not on file  Food Insecurity: Not on file  Housing: Not on file  Physical Activity: Not on file  Social Connections: Not on file  Stress: Not on file  Tobacco Use: Medium Risk  . Smoking Tobacco Use: Former Smoker  . Smokeless Tobacco Use: Never Used  Transportation Needs: Not on file    [place]  No Known Allergies  Medications Reviewed Today    Reviewed by Luiz Ochoa, NP (Nurse Practitioner) on 10/19/20 at 30  Med List Status: <None>  Medication Order Taking? Sig Documenting Provider Last Dose Status Informant  aspirin 81 MG tablet [id number] Yes Take 81 mg by mouth daily. [provider] Taking Active   atorvastatin (LIPITOR) 80 MG tablet [id number] Yes Take 1 tablet (80 mg total) by mouth daily. Ronnell Freshwater, NP Taking Active   budesonide-formoterol [facility]) 160-4.5 MCG/ACT inhaler [id number] Yes Inhale 1 puff into the lungs 2 (two) times daily. Kendell Bane, NP Taking Active   Cholecalciferol (VITAMIN D) 2000 UNITS CAPS [id number] Yes Take by mouth. VITAMIN D 3 2000 IU  ONE SOFTGEL DAILY  --OTC [provider]  Taking Active   clopidogrel (PLAVIX) 75 MG tablet [id number] Yes Take 1 tablet (75 mg total) by mouth daily. Ronnell Freshwater, NP Taking Active   enalapril (VASOTEC) 20 MG tablet [id number] Yes Take 1 tablet (20 mg total) by mouth daily. Ronnell Freshwater, NP Taking Active   ezetimibe (ZETIA) 10 MG tablet [id number] Yes Take 1 tablet (10 mg total) by mouth daily. Ronnell Freshwater, NP Taking Active   OXYGEN [id number] Yes Inhale into the lungs. At night [provider] Taking Active   pantoprazole (PROTONIX) 40 MG tablet [id number] Yes Take 1 tablet (40 mg total) by mouth daily. IN AM Lavera Guise, MD Taking Active   phenytoin (DILANTIN) 100 MG ER capsule [id number]  Take 4 capsules (400 mg total) by mouth at bedtime. Luiz Ochoa, NP  Active  pneumococcal 23 valent vaccine (PNEUMOVAX 23) 25 MCG/0.5ML injection [id number] Yes Inject 0.53ml IM once Ronnell Freshwater, NP Taking Active           Patient Active Problem List   Diagnosis Date Noted  . History of lung cancer 04/21/2020  . Seizures ([place]) 07/20/2019  . Encounter for therapeutic drug level monitoring 07/20/2019  . Need for vaccination against Streptococcus pneumoniae using pneumococcal conjugate vaccine 13 03/05/2019  . GERD (gastroesophageal reflux disease) 02/08/2018  . Obstructive chronic bronchitis without exacerbation ([place]) 02/08/2018  . Encounter for general adult medical examination with abnormal findings 12/18/2017  . Atherosclerosis of aorta ([place]) 12/18/2017  . Dysuria 12/18/2017  . Cerebrovascular accident (CVA) ([place]) 11/01/2017  . HOH (hard of hearing) 11/01/2017  . Essential hypertension 11/01/2017  . Hyperlipidemia 11/01/2017  . Hx of adenomatous polyp of colon 12/16/2016  . Squamous cell lung cancer ([place])   . Lung cancer, 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[place]) 05/07/2012    Immunization History  Administered Date(s) Administered  . Influenza Split 05/06/2012  . Influenza-Unspecified 06/13/2017  . PFIZER(Purple  Top)SARS-COV-2 Vaccination 03/11/2020, 03/31/2020, 10/07/2020  . Pneumococcal Polysaccharide-23 04/20/2020    Conditions to be addressed/monitored:  HTN, Hx of CVA, GERD, HLD, Seizures  There are no care plans that you recently modified to display for this patient.    Medication Assistance: {MEDASSISTANCEINFO:25044}  Patient's preferred pharmacy is:  [facility], [address] [place] [address] Phone: [phone] Fax: [place], [place] [address] Phone: [phone] Fax: [phone]  Uses pill box? {Yes or If no, why not?:20788} Pt endorses ***% compliance  We discussed: {Pharmacy options:24294} Patient decided to: {US Pharmacy IWPY:09983}  Care Plan and Follow Up Patient Decision:  {FOLLOWUP:24991}  Plan: {CM FOLLOW UP JASN:05397}  Beverly Milch, PharmD Clinical Pharmacist [facility] [phone]   Current Barriers:  . {pharmacybarriers:24917} . ***  Pharmacist Clinical Goal(s):  Marland Kitchen Patient will {PHARMACYGOALCHOICES:24921} through collaboration with PharmD and provider.  . ***  Interventions: . 1:1 collaboration with Lavera Guise, MD regarding development and update of comprehensive plan of care as evidenced by provider attestation and co-signature . Inter-disciplinary care team collaboration (see longitudinal plan of care) . Comprehensive medication review performed; medication list updated in electronic medical record  Hypertension (BP goal {CHL HP UPSTREAM Pharmacist BP [phone]}) -{US controlled/uncontrolled:25276} -Current treatment: . Enalapril $RemoveBef'20mg'nesIzIdMZk$  -Medications previously tried: ***  -Current home readings: *** -Current dietary habits: *** -Current exercise habits: *** -{ACTIONS;DENIES/REPORTS:21021675::"Denies"} hypotensive/hypertensive symptoms -Educated on {CCM BP  Counseling:25124} -Counseled to monitor BP at home ***, document, and provide log at future appointments -Enalapril last filled 08/12/20 90ds -{CCMPHARMDINTERVENTION:25122}  Hyperlipidemia/Hx of CVA: (LDL goal < ***) -{US controlled/uncontrolled:25276} -Current treatment: . Zetia $Remov'10mg'eIHQNU$  . ASA $Rem'81mg'dQjg$  . Atorvastatin $RemoveBefore'80mg'LaMeZvCklqrGb$   . Clopidogrel $RemoveBefor'75mg'SQKuDlhlLMdt$  -Medications previously tried: ***  -Current dietary patterns: *** -Current exercise habits: *** -Educated on {CCM HLD Counseling:25126}  - Atorvastatin last filled 09/16/20 90ds, Clopidogrel 08/12/20 90ds, Zetia 08/28/20 90ds -{CCMPHARMDINTERVENTION:25122}  GERD (Goal: ***) -{US controlled/uncontrolled:25276} -Current treatment  . Pantoprazole $RemoveBefore'40mg'RMufzyvJFVGAE$  daily -Medications previously tried: ***  -{CCMPHARMDINTERVENTION:25122}  Seizures (Goal: ***) -{US controlled/uncontrolled:25276} -Current treatment  . Phenytoin $RemoveBef'100mg'OhzvUILpau$  ER -Medications previously tried: ***  -{CCMPHARMDINTERVENTION:25122}   Patient Goals/Self-Care Activities . Patient will:  - {pharmacypatientgoals:24919}  Follow Up Plan: {CM FOLLOW UP WIOX:73532}

## 2020-11-25 ENCOUNTER — Ambulatory Visit (INDEPENDENT_AMBULATORY_CARE_PROVIDER_SITE_OTHER): Payer: Medicare Other | Admitting: Physician Assistant

## 2020-11-25 ENCOUNTER — Telehealth: Payer: Self-pay | Admitting: Pharmacist

## 2020-11-25 ENCOUNTER — Encounter: Payer: Self-pay | Admitting: Physician Assistant

## 2020-11-25 ENCOUNTER — Other Ambulatory Visit: Payer: Self-pay

## 2020-11-25 DIAGNOSIS — Z85118 Personal history of other malignant neoplasm of bronchus and lung: Secondary | ICD-10-CM | POA: Diagnosis not present

## 2020-11-25 DIAGNOSIS — R0602 Shortness of breath: Secondary | ICD-10-CM | POA: Diagnosis not present

## 2020-11-25 DIAGNOSIS — J449 Chronic obstructive pulmonary disease, unspecified: Secondary | ICD-10-CM | POA: Diagnosis not present

## 2020-11-25 DIAGNOSIS — J4489 Other specified chronic obstructive pulmonary disease: Secondary | ICD-10-CM

## 2020-11-25 NOTE — Progress Notes (Addendum)
    Chronic Care Management Pharmacy Assistant   Name: NEEDHAM BIGGINS  MRN: [id number] DOB: Jun 23, 1951  Reason for Encounter: Initial Questions   Medications: Outpatient Encounter Medications as of 11/25/2020  Medication Sig   aspirin 81 MG tablet Take 81 mg by mouth daily.   atorvastatin (LIPITOR) 80 MG tablet Take 1 tablet (80 mg total) by mouth daily.   budesonide-formoterol (SYMBICORT) 160-4.5 MCG/ACT inhaler Inhale 1 puff into the lungs 2 (two) times daily.   Cholecalciferol (VITAMIN D) 2000 UNITS CAPS Take by mouth. VITAMIN D 3 2000 IU  ONE SOFTGEL DAILY  --OTC   clopidogrel (PLAVIX) 75 MG tablet Take 1 tablet (75 mg total) by mouth daily.   enalapril (VASOTEC) 20 MG tablet Take 1 tablet (20 mg total) by mouth daily.   ezetimibe (ZETIA) 10 MG tablet Take 1 tablet (10 mg total) by mouth daily.   OXYGEN Inhale into the lungs. At night   pantoprazole (PROTONIX) 40 MG tablet Take 1 tablet (40 mg total) by mouth daily. IN AM   phenytoin (DILANTIN) 100 MG ER capsule Take 4 capsules (400 mg total) by mouth at bedtime.   pneumococcal 23 valent vaccine (PNEUMOVAX 23) 25 MCG/0.5ML injection Inject 0.80ml IM once   No facility-administered encounter medications on file as of 11/25/2020.   Have you seen any other providers since your last visit?   Any changes in your medications or health?   Any side effects from any medications?  Do you have an symptoms or problems not managed by your medications?   Any concerns about your health right now?   Has your provider asked that you check blood pressure, blood sugar, or follow special diet at home?   Do you get any type of exercise on a regular basis?  Can you think of a goal you would like to reach for your health?   Do you have any problems getting your medications?   Is there anything that you would like to discuss during the appointment?   A second unsuccessful telephone outreach was attempted today. The patient was referred to  pharmacist for assistance with care management and care coordination. Patient appointment with the pharmacist is at 130 pm on 11/26/20 over the phone, the last outreach was at 1257 pm on 11/26/20 to get these questions answered left voicemail to return my call.   Follow-Up:Pharmacist Review  Charlann Lange, [place] Clinical Pharmacist Assistant [phone]   Reviewed by: Beverly Milch, PharmD Clinical Pharmacist [place] [phone]

## 2020-11-25 NOTE — Progress Notes (Signed)
[facility] [address] [place], [place] [address]  Pulmonary Sleep Medicine   Office Visit Note  Patient Name: Derek Webb DOB: [birth year] MRN [id number]  Date of Service: 11/26/2020  Complaints/HPI: Pt is here for pulmonary f/u. He has a hx of lung CA. He most recently had a f/u CT chest in July 2021 that showed post ablation scarring in left upper lobe unchanged from 2019 and atelectasis/scarring of right middle and upper lobes. He is doing well today and reports no problems. He is using his symbicort inhaler daily.  ROS  General: (-) fever, (-) chills, (-) night sweats, (-) weakness Skin: (-) rashes, (-) itching,. Eyes: (-) visual changes, (-) redness, (-) itching. Nose and Sinuses: (-) nasal stuffiness or itchiness, (-) postnasal drip, (-) nosebleeds, (-) sinus trouble. Mouth and Throat: (-) sore throat, (-) hoarseness. Neck: (-) swollen glands, (-) enlarged thyroid, (-) neck pain. Respiratory: - cough, (-) bloody sputum, - shortness of breath, - wheezing. Cardiovascular: - ankle swelling, (-) chest pain. Lymphatic: (-) lymph node enlargement. Neurologic: (-) numbness, (-) tingling. Psychiatric: (-) anxiety, (-) depression   Current Medication: Outpatient Encounter Medications as of 11/25/2020  Medication Sig  . aspirin 81 MG tablet Take 81 mg by mouth daily.  Marland Kitchen atorvastatin (LIPITOR) 80 MG tablet Take 1 tablet (80 mg total) by mouth daily.  . budesonide-formoterol (SYMBICORT) 160-4.5 MCG/ACT inhaler Inhale 1 puff into the lungs 2 (two) times daily.  . Cholecalciferol (VITAMIN D) 2000 UNITS CAPS Take by mouth. VITAMIN D 3 2000 IU  ONE SOFTGEL DAILY  --OTC  . clopidogrel (PLAVIX) 75 MG tablet Take 1 tablet (75 mg total) by mouth daily.  . enalapril (VASOTEC) 20 MG tablet Take 1 tablet (20 mg total) by mouth daily.  Marland Kitchen ezetimibe (ZETIA) 10 MG tablet Take 1 tablet (10 mg total) by mouth daily.  . OXYGEN Inhale into the lungs. At night  . pantoprazole  (PROTONIX) 40 MG tablet Take 1 tablet (40 mg total) by mouth daily. IN AM  . phenytoin (DILANTIN) 100 MG ER capsule Take 4 capsules (400 mg total) by mouth at bedtime.  . pneumococcal 23 valent vaccine (PNEUMOVAX 23) 25 MCG/0.5ML injection Inject 0.58ml IM once   No facility-administered encounter medications on file as of 11/25/2020.    Surgical History: Past Surgical History:  Procedure Laterality Date  . CAROTID SURGERY 2009 -LEFT    . COLON POLYP EXCISION    . COLON SURGERY    . COLONOSCOPY WITH PROPOFOL N/A 04/18/2017   Procedure: COLONOSCOPY WITH PROPOFOL;  Surgeon: Scot Jun, MD;  [facility]COPY;  Service: Endoscopy;  Laterality: N/A;  . ESOPHAGOGASTRODUODENOSCOPY    . IR GENERIC HISTORICAL  01/21/2016   IR RADIOLOGIST EVAL & MGMT 01/21/2016 Irish Lack, MD [facility] INTERV RAD  . IR GENERIC HISTORICAL  10/22/2014   IR RADIOLOGIST EVAL & MGMT 10/22/2014 Irish Lack, MD [facility] INTERV RAD  . IR RADIOLOGIST EVAL & MGMT  02/02/2017  . IR RADIOLOGIST EVAL & MGMT  03/08/2018  . percutaneous biopsy      Medical History: Past Medical History:  Diagnosis Date  . Alcoholism (HCC)   . COPD (chronic obstructive pulmonary disease) (HCC)    SEVERE COPD -OCCASIONALLY USES OXYGEN AT NIGHT-DOES NOT USE INHALERS ON REGULAR BASIS  . Elevated cholesterol   . GERD (gastroesophageal reflux disease)   . HOH (hard of hearing)   . Hypertension   . Lung cancer (HCC)    LEFT UPPER LUNG CARCINOMA-NOT A CANDIDATE  FOR SURGICAL RESECTION BECAUSE OF HIS COPD AND POOR CANDIDATE FOR RADIATION BECAUSE OF TRANSPORTATION PROBLEMS  . Seizures ([place])    CHRONIC DILATIN - PT STATES NO SEIZURES IN PAST COUPLE OF YEARS; PT STATES HIS SEIZURES CAUSED NUMBNESS OF ARMS AND HANDS AND NOT ABLE TO MOVE HIS ARMS  . Stroke ([place])    2 TO 3 YRS AGO-AFFECTED HIS SPEECH--ABLE TO AMBULATE WITHOUT ASSIST AND DOES YARD WORK.  DECREASED HEARING IN BOTH EARS SINCE STROKE.  Marland Kitchen Vitamin D deficiency     Family  History: Family History  Problem Relation Age of Onset  . Colon polyps Sister     Social History: Social History   Socioeconomic History  . Marital status: Single    Spouse name: Not on file  . Number of children: Not on file  . Years of education: Not on file  . Highest education level: Not on file  Occupational History  . Not on file  Tobacco Use  . Smoking status: Former Smoker    Packs/day: 1.00    Years: 35.00    Pack years: 35.00    Types: Cigarettes    Quit date: 12/22/2011    Years since quitting: 8.9  . Smokeless tobacco: Never Used  Vaping Use  . Vaping Use: Never used  Substance and Sexual Activity  . Alcohol use: Yes    Comment: occosionally   . Drug use: No  . Sexual activity: Not on file  Other Topics Concern  . Not on file  Social History Narrative  . Not on file   Social Determinants of Health   Financial Resource Strain: Not on file  Food Insecurity: Not on file  Transportation Needs: Not on file  Physical Activity: Not on file  Stress: Not on file  Social Connections: Not on file  Intimate Partner Violence: Not on file    Vital Signs: Blood pressure 116/80, pulse 77, temperature (!) 97.3 F (36.3 C), resp. rate 16, height _0  (1.702 m), weight 184 lb 12.8 oz (83.8 kg), SpO2 97 %.  Examination: General Appearance: The patient is well-developed, well-nourished, and in no distress. Skin: Gross inspection of skin unremarkable. Head: normocephalic, no gross deformities. Eyes: no gross deformities noted. ENT: ears appear grossly normal no exudates. Neck: Supple. No thyromegaly. No LAD. Respiratory: Lungs clear to auscultation bilaterally. Cardiovascular: Normal S1 and S2 without murmur or rub. Extremities: No cyanosis. pulses are equal. Neurologic: Alert and oriented. No involuntary movements.  LABS: Recent Results (from the past 2160 hour(s))  Comprehensive metabolic panel     Status: Abnormal   Collection Time: 10/13/20  2:12 PM   Result Value Ref Range   Glucose 83 65 - 99 mg/dL   BUN 5 (L) 8 - 27 mg/dL   Creatinine, Ser 0.74 (L) 0.76 - 1.27 mg/dL    Comment:                **Effective October 20, 2020 [facility] will begin**                  reporting the 2021 CKD-EPI creatinine equation that                  estimates kidney function without a race variable.    GFR calc non Af Amer 95 >59 mL/min/1.73   GFR calc Af Amer 110 >59 mL/min/1.73    Comment: **In accordance with recommendations from the NKF-ASN Task force,**   [facility] is in the process of updating its eGFR  calculation to the   2021 CKD-EPI creatinine equation that estimates kidney function   without a race variable.    BUN/Creatinine Ratio 7 (L) 10 - 24   Sodium 140 134 - 144 mmol/L   Potassium 4.6 3.5 - 5.2 mmol/L   Chloride 100 96 - 106 mmol/L   CO2 24 20 - 29 mmol/L   Calcium 9.8 8.6 - 10.2 mg/dL   Total Protein 7.1 6.0 - 8.5 g/dL   Albumin 4.2 3.8 - 4.8 g/dL   Globulin, Total 2.9 1.5 - 4.5 g/dL   Albumin/Globulin Ratio 1.4 1.2 - 2.2   Bilirubin Total <0.2 0.0 - 1.2 mg/dL   Alkaline Phosphatase 148 (H) 44 - 121 IU/L   AST 25 0 - 40 IU/L   ALT 21 0 - 44 IU/L  CBC     Status: None   Collection Time: 10/13/20  2:12 PM  Result Value Ref Range   WBC 5.3 3.4 - 10.8 x10E3/uL   RBC 4.38 4.14 - 5.80 x10E6/uL   Hemoglobin 13.7 13.0 - 17.7 g/dL   Hematocrit 39.9 37.5 - 51.0 %   MCV 91 79 - 97 fL   MCH 31.3 26.6 - 33.0 pg   MCHC 34.3 31.5 - 35.7 g/dL   RDW 13.1 11.6 - 15.4 %   Platelets 355 150 - 450 x10E3/uL  Lipid Panel With LDL/HDL Ratio     Status: Abnormal   Collection Time: 10/13/20  2:12 PM  Result Value Ref Range   Cholesterol, Total 171 100 - 199 mg/dL   Triglycerides 160 (H) 0 - 149 mg/dL   HDL 64 >39 mg/dL   VLDL Cholesterol Cal 27 5 - 40 mg/dL   LDL Chol Calc (NIH) 80 0 - 99 mg/dL   LDL/HDL Ratio 1.3 0.0 - 3.6 ratio    Comment:                                     LDL/HDL Ratio                                             Men   Women                               1/2 Avg.Risk  1.0    1.5                                   Avg.Risk  3.6    3.2                                2X Avg.Risk  6.2    5.0                                3X Avg.Risk  8.0    6.1   T4, free     Status: None   Collection Time: 10/13/20  2:12 PM  Result Value Ref Range   Free T4 1.08 0.82 - 1.77 ng/dL  TSH     Status: None   Collection  Time: 10/13/20  2:12 PM  Result Value Ref Range   TSH 1.690 0.450 - 4.500 uIU/mL  Phenytoin level, total     Status: Abnormal   Collection Time: 10/13/20  2:12 PM  Result Value Ref Range   Phenytoin (Dilantin), Serum 7.8 (L) 10.0 - 20.0 ug/mL    Comment:                                 Detection Limit =  0.8                           <0.8 Indicates None Detected   PSA     Status: None   Collection Time: 10/13/20  2:12 PM  Result Value Ref Range   Prostate Specific Ag, Serum 3.1 0.0 - 4.0 ng/mL    Comment: Roche ECLIA methodology. According to the American Urological Association, Serum PSA should decrease and remain at undetectable levels after radical prostatectomy. The AUA defines biochemical recurrence as an initial PSA value 0.2 ng/mL or greater followed by a subsequent confirmatory PSA value 0.2 ng/mL or greater. Values obtained with different assay methods or kits cannot be used interchangeably. Results cannot be interpreted as absolute evidence of the presence or absence of malignant disease.     Radiology: CT CHEST WO CONTRAST  Result Date: 02/23/2020 CLINICAL DATA:  Shortness of breath. Patient has a history of left upper lobe lung cancer status post thermal ablation in 2013. EXAM: CT CHEST WITHOUT CONTRAST TECHNIQUE: Multidetector CT imaging of the chest was performed following the standard protocol without IV contrast. COMPARISON:  CT chest dated 03/02/2018 FINDINGS: Cardiovascular: Vascular calcifications are seen in the aortic arch. Normal heart size. No pericardial effusion.  Mediastinum/Nodes: No enlarged mediastinal or axillary lymph nodes. Aspirated debris is seen in the trachea. The thyroid and esophagus demonstrate no significant findings. Lungs/Pleura: Spiculated mass/scar in the left upper lobe measures 3.3 x 1.2 cm (series 3, image 52) and does not appear significantly changed since 03/02/2018. Atelectasis/scarring of the right middle and upper lobes along the major and minor fissures appears increased. Upper lobe predominant centrilobular emphysema is noted. Upper Abdomen: A left adrenal nodule is not changed since at least 2013 and benign. Musculoskeletal: Degenerative changes are seen in the spine. IMPRESSION: 1. Post ablation scarring in the left upper lobe is not significantly changed since 03/02/2018. 2. Atelectasis/scarring of the right middle and upper lobes along the major and minor fissures appears increased. Aortic Atherosclerosis (ICD10-I70.0). Electronically Signed   By: Zerita Boers M.D.   On: 02/23/2020 17:22    No results found.  No results found.    Assessment and Plan: Patient Active Problem List   Diagnosis Date Noted  . History of lung cancer 04/21/2020  . Seizures ([place]) 07/20/2019  . Encounter for therapeutic drug level monitoring 07/20/2019  . Need for vaccination against Streptococcus pneumoniae using pneumococcal conjugate vaccine 13 03/05/2019  . GERD (gastroesophageal reflux disease) 02/08/2018  . Obstructive chronic bronchitis without exacerbation ([place]) 02/08/2018  . Encounter for general adult medical examination with abnormal findings 12/18/2017  . Atherosclerosis of aorta ([place]) 12/18/2017  . Dysuria 12/18/2017  . Cerebrovascular accident (CVA) ([place]) 11/01/2017  . HOH (hard of hearing) 11/01/2017  . Essential hypertension 11/01/2017  . Hyperlipidemia 11/01/2017  . Hx of adenomatous polyp of colon 12/16/2016  . Squamous cell lung cancer ([place])   . Lung cancer, upper  lobe ([place]) 05/07/2012    1. Obstructive chronic  bronchitis without exacerbation ([place]) Continue symbicort daily  2. History of lung cancer CT stable in July 2021, will continue scans as appropriate  3. Shortness of breath - Spirometry with Graph showed FEV1 of 25 % which was improved from last spiro FEV1 of 6%.   General Counseling: I have discussed the findings of the evaluation and examination with Jeneen Rinks.  I have also discussed any further diagnostic evaluation thatmay be needed or ordered today. Keil verbalizes understanding of the findings of todays visit. We also reviewed his medications today and discussed drug interactions and side effects including but not limited excessive drowsiness and altered mental states. We also discussed that there is always a risk not just to him but also people around him. he has been encouraged to call the office with any questions or concerns that should arise related to todays visit.  Orders Placed This Encounter  Procedures  . Spirometry with Graph    Order Specific Question:   Where should this test be performed?    Answer:   [facility]    Order Specific Question:   Basic spirometry    Answer:   Yes     Time spent: 30  I have personally obtained a history, examined the patient, evaluated laboratory and imaging results, formulated the assessment and plan and placed orders. This patient was seen by Drema Dallas, PA-C in collaboration with Dr. Devona Konig as a part of collaborative care agreement.     Allyne Gee, MD [facility] Pulmonary and Critical Care Sleep medicine

## 2020-11-26 ENCOUNTER — Telehealth: Payer: Medicare Other

## 2020-11-26 NOTE — Patient Instructions (Signed)
Chronic Obstructive Pulmonary Disease  Chronic obstructive pulmonary disease (COPD) is a long-term (chronic) lung problem. When you have COPD, it is hard for air to get in and out of your lungs. Usually the condition gets worse over time, and your lungs will never return to normal. There are things you can do to keep yourself as healthy as possible. What are the causes?  Smoking. This is the most common cause.  Certain genes passed from parent to child (inherited). What increases the risk?  Being exposed to secondhand smoke from cigarettes, pipes, or cigars.  Being exposed to chemicals and other irritants, such as fumes and dust in the work environment.  Having chronic lung conditions or infections. What are the signs or symptoms?  Shortness of breath, especially during physical activity.  A long-term cough with a large amount of thick mucus. Sometimes, the cough may not have any mucus (dry cough).  Wheezing.  Breathing quickly.  Skin that looks gray or blue, especially in the fingers, toes, or lips.  Feeling tired (fatigue).  Weight loss.  Chest tightness.  Having infections often.  Episodes when breathing symptoms become much worse (exacerbations). At the later stages of this disease, you may have swelling in the ankles, feet, or legs. How is this treated?  Taking medicines.  Quitting smoking, if you smoke.  Rehabilitation. This includes steps to make your body work better. It may involve a team of specialists.  Doing exercises.  Making changes to your diet.  Using oxygen.  Lung surgery.  Lung transplant.  Comfort measures (palliative care). Follow these instructions at home: Medicines  Take over-the-counter and prescription medicines only as told by your doctor.  Talk to your doctor before taking any cough or allergy medicines. You may need to avoid medicines that cause your lungs to be dry. Lifestyle  If you smoke, stop smoking. Smoking makes the  problem worse.  Do not smoke or use any products that contain nicotine or tobacco. If you need help quitting, ask your doctor.  Avoid being around things that make your breathing worse. This may include smoke, chemicals, and fumes.  Stay active, but remember to rest as well.  Learn and use tips on how to manage stress and control your breathing.  Make sure you get enough sleep. Most adults need at least 7 hours of sleep every night.  Eat healthy foods. Eat smaller meals more often. Rest before meals. Controlled breathing Learn and use tips on how to control your breathing as told by your doctor. Try:  Breathing in (inhaling) through your nose for 1 second. Then, pucker your lips and breath out (exhale) through your lips for 2 seconds.  Putting one hand on your belly (abdomen). Breathe in slowly through your nose for 1 second. Your hand on your belly should move out. Pucker your lips and breathe out slowly through your lips. Your hand on your belly should move in as you breathe out.   Controlled coughing Learn and use controlled coughing to clear mucus from your lungs. Follow these steps: 1. Lean your head a little forward. 2. Breathe in deeply. 3. Try to hold your breath for 3 seconds. 4. Keep your mouth slightly open while coughing 2 times. 5. Spit any mucus out into a tissue. 6. Rest and do the steps again 1 or 2 times as needed. General instructions  Make sure you get all the shots (vaccines) that your doctor recommends. Ask your doctor about a flu shot and a pneumonia shot.    Use oxygen therapy and pulmonary rehabilitation if told by your doctor. If you need home oxygen therapy, ask your doctor if you should buy a tool to measure your oxygen level (oximeter).  Make a COPD action plan with your doctor. This helps you to know what to do if you feel worse than usual.  Manage any other conditions you have as told by your doctor.  Avoid going outside when it is very hot, cold, or  humid.  Avoid people who have a sickness you can catch (contagious).  Keep all follow-up visits. Contact a doctor if:  You cough up more mucus than usual.  There is a change in the color or thickness of the mucus.  It is harder to breathe than usual.  Your breathing is faster than usual.  You have trouble sleeping.  You need to use your medicines more often than usual.  You have trouble doing your normal activities such as getting dressed or walking around the house. Get help right away if:  You have shortness of breath while resting.  You have shortness of breath that stops you from: ? Being able to talk. ? Doing normal activities.  Your chest hurts for longer than 5 minutes.  Your skin color is more blue than usual.  Your pulse oximeter shows that you have low oxygen for longer than 5 minutes.  You have a fever.  You feel too tired to breathe normally. These symptoms may represent a serious problem that is an emergency. Do not wait to see if the symptoms will go away. Get medical help right away. Call your local emergency services (911 in the [place] Do not drive yourself to the hospital. Summary  Chronic obstructive pulmonary disease (COPD) is a long-term lung problem.  The way your lungs work will never return to normal. Usually the condition gets worse over time. There are things you can do to keep yourself as healthy as possible.  Take over-the-counter and prescription medicines only as told by your doctor.  If you smoke, stop. Smoking makes the problem worse. This information is not intended to replace advice given to you by your health care provider. Make sure you discuss any questions you have with your health care provider. Document Revised: 06/17/2020 Document Reviewed: 06/17/2020 Elsevier Patient Education  2021 [employer]   

## 2020-11-26 NOTE — Progress Notes (Signed)
Chronic Care Management Pharmacy Note  11/24/2020 Name:  Derek Webb MRN:  [id number] DOB:  [birth year]  Subjective: Derek Webb is an 70 y.o. year old male who is a primary patient of Humphrey Rolls Timoteo Gaul, MD.  The CCM team was consulted for assistance with disease management and care coordination needs.    Engaged with patient by telephone for initial visit in response to provider referral for pharmacy case management and/or care coordination services.   Consent to Services:  The patient was given the following information about Chronic Care Management services today, agreed to services, and gave verbal consent: 1. CCM service includes personalized support from designated clinical staff supervised by the primary care provider, including individualized plan of care and coordination with other care providers 2. 24/7 contact phone numbers for assistance for urgent and routine care needs. 3. Service will only be billed when office clinical staff spend 20 minutes or more in a month to coordinate care. 4. Only one practitioner may furnish and bill the service in a calendar month. 5.The patient may stop CCM services at any time (effective at the end of the month) by phone call to the office staff. 6. The patient will be responsible for cost sharing (co-pay) of up to 20% of the service fee (after annual deductible is met). Patient agreed to services and consent obtained.  Patient Care Team: Lavera Guise, MD as PCP - General (Internal Medicine) Edythe Clarity, [facility] as Pharmacist (Pharmacist)  Recent office visits: 10/16/20 [place]) - difficulty with medication adherence. No changes to medication, suggest sister help with medication administration.  04/21/20 ([place]) - No med changes Recent consult visits: None recent  Hospital visits: None in previous 6 months  Objective:  Lab Results  Component Value Date   CREATININE 0.74 (L) 10/13/2020   BUN 5 (L) 10/13/2020   GFRNONAA 95 10/13/2020    GFRAA 110 10/13/2020   NA 140 10/13/2020   K 4.6 10/13/2020   CALCIUM 9.8 10/13/2020   CO2 24 10/13/2020   GLUCOSE 83 10/13/2020    No results found for: HGBA1C, FRUCTOSAMINE, GFR, MICROALBUR  Last diabetic Eye exam: No results found for: HMDIABEYEEXA  Last diabetic Foot exam: No results found for: HMDIABFOOTEX   Lab Results  Component Value Date   CHOL 171 10/13/2020   HDL 64 10/13/2020   LDLCALC 80 10/13/2020   TRIG 160 (H) 10/13/2020    Hepatic Function Latest Ref Rng & Units 10/13/2020 08/06/2019  Total Protein 6.0 - 8.5 g/dL 7.1 7.3  Albumin 3.8 - 4.8 g/dL 4.2 4.1  AST 0 - 40 IU/L 25 23  ALT 0 - 44 IU/L 21 38  Alk Phosphatase 44 - 121 IU/L 148(H) 135(H)  Total Bilirubin 0.0 - 1.2 mg/dL <0.2 <0.2    Lab Results  Component Value Date/Time   TSH 1.690 10/13/2020 02:12 PM   TSH 1.990 08/06/2019 08:06 AM   FREET4 1.08 10/13/2020 02:12 PM   FREET4 0.93 08/06/2019 08:06 AM    CBC Latest Ref Rng & Units 10/13/2020 08/06/2019 05/06/2012  WBC 3.4 - 10.8 x10E3/uL 5.3 5.1 8.3  Hemoglobin 13.0 - 17.7 g/dL 13.7 14.3 12.9(L)  Hematocrit 37.5 - 51.0 % 39.9 42.2 38.1(L)  Platelets 150 - 450 x10E3/uL 355 347 357    No results found for: VD25OH  Clinical ASCVD: Yes  The ASCVD Risk score Mikey Bussing DC Jr., et al., 2013) failed to calculate for the following reasons:   The patient has a prior MI or stroke  diagnosis    Depression screen [facility] 2/9 10/16/2020 07/03/2020 04/21/2020  Decreased Interest 0 0 0  Down, Depressed, Hopeless 0 0 0  PHQ - 2 Score 0 0 0      Social History   Tobacco Use  Smoking Status Former Smoker  . Packs/day: 1.00  . Years: 35.00  . Pack years: 35.00  . Types: Cigarettes  . Quit date: 12/22/2011  . Years since quitting: 8.9  Smokeless Tobacco Never Used   BP Readings from Last 3 Encounters:  10/16/20 132/82  07/03/20 127/80  04/21/20 132/86   Pulse Readings from Last 3 Encounters:  10/16/20 89  07/03/20 74  04/21/20 71   Wt Readings from  Last 3 Encounters:  10/16/20 189 lb 8 oz (86 kg)  07/03/20 192 lb (87.1 kg)  04/21/20 194 lb 9.6 oz (88.3 kg)   BMI Readings from Last 3 Encounters:  10/16/20 29.68 kg/m  07/03/20 30.07 kg/m  04/21/20 30.48 kg/m    Assessment/Interventions: Review of patient past medical history, allergies, medications, health status, including review of consultants reports, laboratory and other test data, was performed as part of comprehensive evaluation and provision of chronic care management services.   SDOH:  (Social Determinants of Health) assessments and interventions performed: Yes  SDOH Screenings   Alcohol Screen: Low Risk   . Last Alcohol Screening Score (AUDIT): 2  Depression (PHQ2-9): Low Risk   . PHQ-2 Score: 0  Financial Resource Strain: Not on file  Food Insecurity: Not on file  Housing: Not on file  Physical Activity: Not on file  Social Connections: Not on file  Stress: Not on file  Tobacco Use: Medium Risk  . Smoking Tobacco Use: Former Smoker  . Smokeless Tobacco Use: Never Used  Transportation Needs: Not on file    [place]  No Known Allergies  Medications Reviewed Today    Reviewed by Luiz Ochoa, NP (Nurse Practitioner) on 10/19/20 at 25  Med List Status: <None>  Medication Order Taking? Sig Documenting Provider Last Dose Status Informant  aspirin 81 MG tablet [id number] Yes Take 81 mg by mouth daily. [provider] Taking Active   atorvastatin (LIPITOR) 80 MG tablet [id number] Yes Take 1 tablet (80 mg total) by mouth daily. Ronnell Freshwater, NP Taking Active   budesonide-formoterol [facility]) 160-4.5 MCG/ACT inhaler [id number] Yes Inhale 1 puff into the lungs 2 (two) times daily. Kendell Bane, NP Taking Active   Cholecalciferol (VITAMIN D) 2000 UNITS CAPS [id number] Yes Take by mouth. VITAMIN D 3 2000 IU  ONE SOFTGEL DAILY  --OTC [provider] Taking Active   clopidogrel (PLAVIX) 75 MG tablet [id number] Yes Take 1 tablet (75 mg  total) by mouth daily. Ronnell Freshwater, NP Taking Active   enalapril (VASOTEC) 20 MG tablet [id number] Yes Take 1 tablet (20 mg total) by mouth daily. Ronnell Freshwater, NP Taking Active   ezetimibe (ZETIA) 10 MG tablet [id number] Yes Take 1 tablet (10 mg total) by mouth daily. Ronnell Freshwater, NP Taking Active   OXYGEN [id number] Yes Inhale into the lungs. At night [provider] Taking Active   pantoprazole (PROTONIX) 40 MG tablet [id number] Yes Take 1 tablet (40 mg total) by mouth daily. IN AM Lavera Guise, MD Taking Active   phenytoin (DILANTIN) 100 MG ER capsule [id number]  Take 4 capsules (400 mg total) by mouth at bedtime. Luiz Ochoa, NP  Active   pneumococcal 23 valent vaccine (PNEUMOVAX 23) 25 MCG/0.5ML injection [id number]  Yes Inject 0.76m IM once BRonnell Freshwater NP Taking Active           Patient Active Problem List   Diagnosis Date Noted  . History of lung cancer 04/21/2020  . Seizures ([place] 07/20/2019  . Encounter for therapeutic drug level monitoring 07/20/2019  . Need for vaccination against Streptococcus pneumoniae using pneumococcal conjugate vaccine 13 03/05/2019  . GERD (gastroesophageal reflux disease) 02/08/2018  . Obstructive chronic bronchitis without exacerbation ([place] 02/08/2018  . Encounter for general adult medical examination with abnormal findings 12/18/2017  . Atherosclerosis of aorta ([place] 12/18/2017  . Dysuria 12/18/2017  . Cerebrovascular [place] (CVA) ([place] 11/01/2017  . HOH (hard of hearing) 11/01/2017  . Essential hypertension 11/01/2017  . Hyperlipidemia 11/01/2017  . Hx of adenomatous polyp of colon 12/16/2016  . Squamous cell lung cancer ([place]   . Lung cancer, 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[place] 05/07/2012    Immunization History  Administered Date(s) Administered  . Influenza Split 05/06/2012  . Influenza-Unspecified 06/13/2017  . PFIZER(Purple Top)SARS-COV-2 Vaccination 03/11/2020, 03/31/2020, 10/07/2020  . Pneumococcal Polysaccharide-23  04/20/2020    Conditions to be addressed/monitored:  HTN, Hx of CVA, GERD, HLD, Seizures  Goals    . Track and Manage My Blood Pressure-Hypertension     Timeframe:  Long-Range Goal Priority:  High Start Date:   11/27/20                          Expected End Date:  05/29/21                     Follow Up Date 02/26/21   - check blood pressure 3 times per week - choose a place to take my blood pressure (home, clinic or office, retail store) - write blood pressure results in a log or diary    Why is this important?    You won't feel high blood pressure, but it can still hurt your blood vessels.   High blood pressure can cause heart or kidney problems. It can also cause a stroke.   Making lifestyle changes like losing a little weight or eating less salt will help.   Checking your blood pressure at home and at different times of the day can help to control blood pressure.   If the doctor prescribes medicine remember to take it the way the doctor ordered.   Call the office if you cannot afford the medicine or if there are questions about it.     Notes:       Patient Care Plan: General Pharmacy (Adult)    Problem Identified: HTN, Hx of CVA, GERD, HLD, Seizures   Priority: High  Onset Date: 11/27/2020    Long-Range Goal: Patient-Specific Goal   Start Date: 11/28/2020  Expected End Date: 05/30/2021  This Visit's Progress: On track  Priority: High  Note:   Current Barriers:  . Unable to independently monitor therapeutic efficacy   Pharmacist Clinical Goal(s):  .Marland KitchenPatient will maintain control of blood pressure as evidenced by home monitoring or possible RPM.  . adhere to prescribed medication regimen as evidenced by fill dates . contact provider office for questions/concerns as evidenced notation of same in electronic health record through collaboration with PharmD and provider.   Interventions: . 1:1 collaboration with KLavera Guise MD regarding development and update of  comprehensive plan of care as evidenced by provider attestation and co-signature . Inter-disciplinary care team collaboration (see longitudinal plan of care) . Comprehensive  medication review performed; medication list updated in electronic medical record  Hypertension (BP goal <140/90) -Controlled -Current treatment: . Enalapril 72m -Medications previously tried: quinapril -Current home readings: patient does not have a blood pressure cuff -Current dietary habits: reports good appetite, balanced diet -Current exercise habits: Patient walks outside in the neighborhood a few times per week -Denies hypotensive/hypertensive symptoms -Educated on BP goals and benefits of medications for prevention of heart attack, stroke and kidney damage; Exercise goal of 150 minutes per week; Importance of home blood pressure monitoring; -Counseled to monitor BP at home as able, document, and provide log at future appointments -Enalapril last filled 08/12/20 90ds per report - patient confirms the have 90 days supply filled 11/14/20 -Recommended to continue current medication  Hyperlipidemia/Hx of CVA: (LDL goal < 70) -Not ideally controlled -Current treatment: . Zetia 179m. ASA 8190m Atorvastatin 79m26m Clopidogrel 75mg30mdications previously tried: none noted  -Current dietary patterns: see above -Current exercise habits: see above -Educated on Cholesterol goals;  Benefits of statin for ASCVD risk reduction; Importance of limiting foods high in cholesterol;  - Atorvastatin last filled 09/16/20 90ds, Clopidogrel 08/12/20 90ds, Zetia 08/28/20 90ds - patient confirms he has plenty of supply on hand -Recommended to continue current medication, recommend repeat lipid panel after 6 months  GERD (Goal: Minimize symptoms) -Controlled -Current treatment  . Pantoprazole 40mg 77my -Medications previously tried: none noted -Patient confirms correct administration -Denies current symptoms   -Recommended to continue current medication  Seizures (Goal: Prevent seizures) -Controlled -Current treatment  . Phenytoin 100mg E42medications previously tried: none noted -Reports he has not had a seizure in a long time -Discussed the importance if medication adherence -Recommended to continue current medication   Patient Goals/Self-Care Activities . Patient will:  - take medications as prescribed check blood pressure as able, document, and provide at future appointments target a minimum of 150 minutes of moderate intensity exercise weekly  Follow Up Plan: The care management team will reach out to the patient again over the next 120 days.            Medication Assistance: None required.  Patient affirms current coverage meets needs.  [phone] [phone] box? Yes Pt endorses 100% compliance  We discussed: Benefits of medication synchronization, packaging and delivery as well as enhanced pharmacist oversight with [employer]. Patient decided to: Continue current medication management strategy  Care Plan and Follow Up Patient Decision:  Patient agrees to Care Plan and Follow-up.  Plan: The care management team will reach out to the patient again over the next 120 days.  ChristiBeverly MilchD Clinical Pharmacist [place] [phone]

## 2020-11-27 ENCOUNTER — Ambulatory Visit: Payer: Medicare Other | Admitting: Pharmacist

## 2020-11-27 DIAGNOSIS — I10 Essential (primary) hypertension: Secondary | ICD-10-CM

## 2020-11-27 DIAGNOSIS — E782 Mixed hyperlipidemia: Secondary | ICD-10-CM

## 2020-11-27 DIAGNOSIS — R569 Unspecified convulsions: Secondary | ICD-10-CM

## 2020-11-28 NOTE — Patient Instructions (Addendum)
Visit Information  Goals Addressed            This Visit's Progress   . Track and Manage My Blood Pressure-Hypertension       Timeframe:  Long-Range Goal Priority:  High Start Date:   11/27/20                          Expected End Date:  05/29/21                     Follow Up Date 02/26/21   - check blood pressure 3 times per week - choose a place to take my blood pressure (home, clinic or office, retail store) - write blood pressure results in a log or diary    Why is this important?    You won't feel high blood pressure, but it can still hurt your blood vessels.   High blood pressure can cause heart or kidney problems. It can also cause a stroke.   Making lifestyle changes like losing a little weight or eating less salt will help.   Checking your blood pressure at home and at different times of the day can help to control blood pressure.   If the doctor prescribes medicine remember to take it the way the doctor ordered.   Call the office if you cannot afford the medicine or if there are questions about it.     Notes:       Patient Care Plan: General Pharmacy (Adult)    Problem Identified: HTN, Hx of CVA, GERD, HLD, Seizures   Priority: High  Onset Date: 11/27/2020    Long-Range Goal: Patient-Specific Goal   Start Date: 11/28/2020  Expected End Date: 05/30/2021  This Visit's Progress: On track  Priority: High  Note:   Current Barriers:  . Unable to independently monitor therapeutic efficacy   Pharmacist Clinical Goal(s):  Marland Kitchen Patient will maintain control of blood pressure as evidenced by home monitoring or possible RPM.  . adhere to prescribed medication regimen as evidenced by fill dates . contact provider office for questions/concerns as evidenced notation of same in electronic health record through collaboration with PharmD and provider.   Interventions: . 1:1 collaboration with Lavera Guise, MD regarding development and update of comprehensive plan of care  as evidenced by provider attestation and co-signature . Inter-disciplinary care team collaboration (see longitudinal plan of care) . Comprehensive medication review performed; medication list updated in electronic medical record  Hypertension (BP goal <140/90) -Controlled -Current treatment: . Enalapril 20mg  -Medications previously tried: quinapril -Current home readings: patient does not have a blood pressure cuff -Current dietary habits: reports good appetite, balanced diet -Current exercise habits: Patient walks outside in the neighborhood a few times per week -Denies hypotensive/hypertensive symptoms -Educated on BP goals and benefits of medications for prevention of heart attack, stroke and kidney damage; Exercise goal of 150 minutes per week; Importance of home blood pressure monitoring; -Counseled to monitor BP at home as able, document, and provide log at future appointments -Enalapril last filled 08/12/20 90ds per report - patient confirms the have 90 days supply filled 11/14/20 -Recommended to continue current medication  Hyperlipidemia/Hx of CVA: (LDL goal < 70) -Not ideally controlled -Current treatment: . Zetia 10mg  . ASA 81mg  . Atorvastatin 80mg   . Clopidogrel 75mg  -Medications previously tried: none noted  -Current dietary patterns: see above -Current exercise habits: see above -Educated on Cholesterol goals;  Benefits of statin for ASCVD risk  reduction; Importance of limiting foods high in cholesterol;  - Atorvastatin last filled 09/16/20 90ds, Clopidogrel 08/12/20 90ds, Zetia 08/28/20 90ds - patient confirms he has plenty of supply on hand -Recommended to continue current medication, recommend repeat lipid panel after 6 months  GERD (Goal: Minimize symptoms) -Controlled -Current treatment  . Pantoprazole 40mg  daily -Medications previously tried: none noted -Patient confirms correct administration -Denies current symptoms  -Recommended to continue current  medication  Seizures (Goal: Prevent seizures) -Controlled -Current treatment  . Phenytoin 100mg  ER -Medications previously tried: none noted -Reports he has not had a seizure in a long time -Discussed the importance if medication adherence -Recommended to continue current medication   Patient Goals/Self-Care Activities . Patient will:  - take medications as prescribed check blood pressure as able, document, and provide at future appointments target a minimum of 150 minutes of moderate intensity exercise weekly  Follow Up Plan: The care management team will reach out to the patient again over the next 120 days.         Mr. Derek Webb was given information about Chronic Care Management services today including:  1. CCM service includes personalized support from designated clinical staff supervised by his physician, including individualized plan of care and coordination with other care providers 2. 24/7 contact phone numbers for assistance for urgent and routine care needs. 3. Standard insurance, coinsurance, copays and deductibles apply for chronic care management only during months in which we provide at least 20 minutes of these services. Most insurances cover these services at 100%, however patients may be responsible for any copay, coinsurance and/or deductible if applicable. This service may help you avoid the need for more expensive face-to-face services. 4. Only one practitioner may furnish and bill the service in a calendar month. 5. The patient may stop CCM services at any time (effective at the end of the month) by phone call to the office staff.  Patient agreed to services and verbal consent obtained.   The patient verbalized understanding of instructions, educational materials, and care plan provided today and agreed to receive a mailed copy of patient instructions, educational materials, and care plan.  Telephone follow up appointment with pharmacy team member scheduled  for: 4 months  Edythe Clarity, [facility]  Hypertension, Adult High blood pressure (hypertension) is when the force of blood pumping through the arteries is too strong. The arteries are the blood vessels that carry blood from the heart throughout the body. Hypertension forces the heart to work harder to pump blood and may cause arteries to become narrow or stiff. Untreated or uncontrolled hypertension can cause a heart attack, heart failure, a stroke, kidney disease, and other problems. A blood pressure reading consists of a higher number over a lower number. Ideally, your blood pressure should be below 120/80. The first ("top") number is called the systolic pressure. It is a measure of the pressure in your arteries as your heart beats. The second ("bottom") number is called the diastolic pressure. It is a measure of the pressure in your arteries as the heart relaxes. What are the causes? The exact cause of this condition is not known. There are some conditions that result in or are related to high blood pressure. What increases the risk? Some risk factors for high blood pressure are under your control. The following factors may make you more likely to develop this condition:  Smoking.  Having type 2 diabetes mellitus, high cholesterol, or both.  Not getting enough exercise or physical activity.  Being  overweight.  Having too much fat, sugar, calories, or salt (sodium) in your diet.  Drinking too much alcohol. Some risk factors for high blood pressure may be difficult or impossible to change. Some of these factors include:  Having chronic kidney disease.  Having a family history of high blood pressure.  Age. Risk increases with age.  Race. You may be at higher risk if you are African American.  Gender. Men are at higher risk than women before age 46. After age 48, women are at higher risk than men.  Having obstructive sleep apnea.  Stress. What are the signs or symptoms? High blood  pressure may not cause symptoms. Very high blood pressure (hypertensive crisis) may cause:  Headache.  Anxiety.  Shortness of breath.  Nosebleed.  Nausea and vomiting.  Vision changes.  Severe chest pain.  Seizures. How is this diagnosed? This condition is diagnosed by measuring your blood pressure while you are seated, with your arm resting on a flat surface, your legs uncrossed, and your feet flat on the floor. The cuff of the blood pressure monitor will be placed directly against the skin of your upper arm at the level of your heart. It should be measured at least twice using the same arm. Certain conditions can cause a difference in blood pressure between your right and left arms. Certain factors can cause blood pressure readings to be lower or higher than normal for a short period of time:  When your blood pressure is higher when you are in a health care provider's office than when you are at home, this is called white coat hypertension. Most people with this condition do not need medicines.  When your blood pressure is higher at home than when you are in a health care provider's office, this is called masked hypertension. Most people with this condition may need medicines to control blood pressure. If you have a high blood pressure reading during one visit or you have normal blood pressure with other risk factors, you may be asked to:  Return on a different day to have your blood pressure checked again.  Monitor your blood pressure at home for 1 week or longer. If you are diagnosed with hypertension, you may have other blood or imaging tests to help your health care provider understand your overall risk for other conditions. How is this treated? This condition is treated by making healthy lifestyle changes, such as eating healthy foods, exercising more, and reducing your alcohol intake. Your health care provider may prescribe medicine if lifestyle changes are not enough to get  your blood pressure under control, and if:  Your systolic blood pressure is above 130.  Your diastolic blood pressure is above 80. Your personal target blood pressure may vary depending on your medical conditions, your age, and other factors. Follow these instructions at home: Eating and drinking  Eat a diet that is high in fiber and potassium, and low in sodium, added sugar, and fat. An example eating plan is called the DASH (Dietary Approaches to Stop Hypertension) diet. To eat this way: ? Eat plenty of fresh fruits and vegetables. Try to fill one half of your plate at each meal with fruits and vegetables. ? Eat whole grains, such as whole-wheat pasta, brown rice, or whole-grain bread. Fill about one fourth of your plate with whole grains. ? Eat or drink low-fat dairy products, such as skim milk or low-fat yogurt. ? Avoid fatty cuts of meat, processed or cured meats, and poultry with  skin. Fill about one fourth of your plate with lean proteins, such as fish, chicken without skin, beans, eggs, or tofu. ? Avoid pre-made and processed foods. These tend to be higher in sodium, added sugar, and fat.  Reduce your daily sodium intake. Most people with hypertension should eat less than 1,500 mg of sodium a day.  Do not drink alcohol if: ? Your health care provider tells you not to drink. ? You are pregnant, may be pregnant, or are planning to become pregnant.  If you drink alcohol: ? Limit how much you use to:  0-1 drink a day for women.  0-2 drinks a day for men. ? Be aware of how much alcohol is in your drink. In the [place] one drink equals one 12 oz bottle of beer (355 mL), one 5 oz glass of wine (148 mL), or one 1 oz glass of hard liquor (44 mL).   Lifestyle  Work with your health care provider to maintain a healthy body weight or to lose weight. Ask what an ideal weight is for you.  Get at least 30 minutes of exercise most days of the week. Activities may include walking, swimming, or  biking.  Include exercise to strengthen your muscles (resistance exercise), such as Pilates or lifting weights, as part of your weekly exercise routine. Try to do these types of exercises for 30 minutes at least 3 days a week.  Do not use any products that contain nicotine or tobacco, such as cigarettes, e-cigarettes, and chewing tobacco. If you need help quitting, ask your health care provider.  Monitor your blood pressure at home as told by your health care provider.  Keep all follow-up visits as told by your health care provider. This is important.   Medicines  Take over-the-counter and prescription medicines only as told by your health care provider. Follow directions carefully. Blood pressure medicines must be taken as prescribed.  Do not skip doses of blood pressure medicine. Doing this puts you at risk for problems and can make the medicine less effective.  Ask your health care provider about side effects or reactions to medicines that you should watch for. Contact a health care provider if you:  Think you are having a reaction to a medicine you are taking.  Have headaches that keep coming back (recurring).  Feel dizzy.  Have swelling in your ankles.  Have trouble with your vision. Get help right away if you:  Develop a severe headache or confusion.  Have unusual weakness or numbness.  Feel faint.  Have severe pain in your chest or abdomen.  Vomit repeatedly.  Have trouble breathing. Summary  Hypertension is when the force of blood pumping through your arteries is too strong. If this condition is not controlled, it may put you at risk for serious complications.  Your personal target blood pressure may vary depending on your medical conditions, your age, and other factors. For most people, a normal blood pressure is less than 120/80.  Hypertension is treated with lifestyle changes, medicines, or a combination of both. Lifestyle changes include losing weight, eating  a healthy, low-sodium diet, exercising more, and limiting alcohol. This information is not intended to replace advice given to you by your health care provider. Make sure you discuss any questions you have with your health care provider. Document Revised: 04/19/2018 Document Reviewed: 04/19/2018 Elsevier Patient Education  2021 [employer].

## 2020-12-09 DIAGNOSIS — R569 Unspecified convulsions: Secondary | ICD-10-CM | POA: Diagnosis not present

## 2020-12-10 LAB — COMPREHENSIVE METABOLIC PANEL
ALT: 29 IU/L (ref 0–44)
AST: 20 IU/L (ref 0–40)
Albumin/Globulin Ratio: 1.3 (ref 1.2–2.2)
Albumin: 4.2 g/dL (ref 3.8–4.8)
Alkaline Phosphatase: 140 IU/L — ABNORMAL HIGH (ref 44–121)
BUN/Creatinine Ratio: 10 (ref 10–24)
BUN: 7 mg/dL — ABNORMAL LOW (ref 8–27)
Bilirubin Total: 0.3 mg/dL (ref 0.0–1.2)
CO2: 24 mmol/L (ref 20–29)
Calcium: 10 mg/dL (ref 8.6–10.2)
Chloride: 101 mmol/L (ref 96–106)
Creatinine, Ser: 0.71 mg/dL — ABNORMAL LOW (ref 0.76–1.27)
Globulin, Total: 3.2 g/dL (ref 1.5–4.5)
Glucose: 87 mg/dL (ref 65–99)
Potassium: 4.6 mmol/L (ref 3.5–5.2)
Sodium: 143 mmol/L (ref 134–144)
Total Protein: 7.4 g/dL (ref 6.0–8.5)
eGFR: 101 mL/min/{1.73_m2} (ref >59)

## 2020-12-10 LAB — PHENYTOIN LEVEL, TOTAL: Phenytoin (Dilantin), Serum: 8.1 ug/mL — ABNORMAL LOW (ref 10.0–20.0)

## 2020-12-12 NOTE — Progress Notes (Signed)
Labs reviewed, will discuss at upcoming visit. Improved dilantin levels.

## 2020-12-15 ENCOUNTER — Other Ambulatory Visit: Payer: Self-pay

## 2020-12-15 ENCOUNTER — Encounter: Payer: Self-pay | Admitting: Hospice and Palliative Medicine

## 2020-12-15 ENCOUNTER — Ambulatory Visit (INDEPENDENT_AMBULATORY_CARE_PROVIDER_SITE_OTHER): Payer: Medicare Other | Admitting: Hospice and Palliative Medicine

## 2020-12-15 VITALS — BP 118/78 | HR 80 | Temp 98.4°F | Resp 16 | Ht 67.0 in | Wt 185.2 lb

## 2020-12-15 DIAGNOSIS — Z8673 Personal history of transient ischemic attack (TIA), and cerebral infarction without residual deficits: Secondary | ICD-10-CM

## 2020-12-15 DIAGNOSIS — I10 Essential (primary) hypertension: Secondary | ICD-10-CM

## 2020-12-15 DIAGNOSIS — Z8669 Personal history of other diseases of the nervous system and sense organs: Secondary | ICD-10-CM | POA: Diagnosis not present

## 2020-12-15 MED ORDER — PHENYTOIN SODIUM EXTENDED 100 MG PO CAPS
400.0000 mg | ORAL_CAPSULE | Freq: Every day | ORAL | 1 refills | Status: DC
Start: 2020-12-15 — End: 2021-04-17

## 2020-12-15 NOTE — Progress Notes (Signed)
[facility] [place], [place] [address]  Internal MEDICINE  Office Visit Note  Patient Name: Derek Webb  [id number]  [id number]  Date of Service: 12/22/2020  Chief Complaint  Patient presents with  . Follow-up    Review meds  . Gastroesophageal Reflux  . Hyperlipidemia  . Hypertension  . COPD    HPI Patient is here for routine follow-up Dilantin level has improved since last visit, has been taking medication daily--requesting refills Overall, things have been going well Sleeping well at night No recent changes in appetite or issues with constipation  Feeling well, no acute issues or complaints today  Current Medication: Outpatient Encounter Medications as of 12/15/2020  Medication Sig  . aspirin 81 MG tablet Take 81 mg by mouth daily.  Marland Kitchen atorvastatin (LIPITOR) 80 MG tablet Take 1 tablet (80 mg total) by mouth daily.  . budesonide-formoterol (SYMBICORT) 160-4.5 MCG/ACT inhaler Inhale 1 puff into the lungs 2 (two) times daily.  . Cholecalciferol (VITAMIN D) 2000 UNITS CAPS Take by mouth. VITAMIN D 3 2000 IU  ONE SOFTGEL DAILY  --OTC  . clopidogrel (PLAVIX) 75 MG tablet Take 1 tablet (75 mg total) by mouth daily.  . enalapril (VASOTEC) 20 MG tablet Take 1 tablet (20 mg total) by mouth daily.  Marland Kitchen ezetimibe (ZETIA) 10 MG tablet Take 1 tablet (10 mg total) by mouth daily.  . OXYGEN Inhale into the lungs. At night  . pantoprazole (PROTONIX) 40 MG tablet Take 1 tablet (40 mg total) by mouth daily. IN AM  . pneumococcal 23 valent vaccine (PNEUMOVAX 23) 25 MCG/0.5ML injection Inject 0.77ml IM once  . [DISCONTINUED] phenytoin (DILANTIN) 100 MG ER capsule Take 4 capsules (400 mg total) by mouth at bedtime.  . phenytoin (DILANTIN) 100 MG ER capsule Take 4 capsules (400 mg total) by mouth at bedtime.   No facility-administered encounter medications on file as of 12/15/2020.    Surgical History: Past Surgical History:  Procedure Laterality Date  . CAROTID  SURGERY 2009 -LEFT    . COLON POLYP EXCISION    . COLON SURGERY    . COLONOSCOPY WITH PROPOFOL N/A 04/18/2017   Procedure: COLONOSCOPY WITH PROPOFOL;  Surgeon: Manya Silvas, MD;  [facility] ENDOSCOPY;  Service: Endoscopy;  Laterality: N/A;  . ESOPHAGOGASTRODUODENOSCOPY    . IR GENERIC HISTORICAL  01/21/2016   IR RADIOLOGIST EVAL & MGMT 01/21/2016 Aletta Edouard, MD [facility] INTERV RAD  . IR GENERIC HISTORICAL  10/22/2014   IR RADIOLOGIST EVAL & MGMT 10/22/2014 Aletta Edouard, MD [facility] INTERV RAD  . IR RADIOLOGIST EVAL & MGMT  02/02/2017  . IR RADIOLOGIST EVAL & MGMT  03/08/2018  . percutaneous biopsy      Medical History: Past Medical History:  Diagnosis Date  . Alcoholism ([place])   . COPD (chronic obstructive pulmonary disease) (HCC)    SEVERE COPD -OCCASIONALLY USES OXYGEN AT NIGHT-DOES NOT USE INHALERS ON REGULAR BASIS  . Elevated cholesterol   . GERD (gastroesophageal reflux disease)   . HOH (hard of hearing)   . Hypertension   . Lung cancer ([place])    LEFT UPPER LUNG CARCINOMA-NOT A CANDIDATE FOR SURGICAL RESECTION BECAUSE OF HIS COPD AND POOR CANDIDATE FOR RADIATION BECAUSE OF TRANSPORTATION PROBLEMS  . Seizures ([place])    CHRONIC DILATIN - PT STATES NO SEIZURES IN PAST COUPLE OF YEARS; PT STATES HIS SEIZURES CAUSED NUMBNESS OF ARMS AND HANDS AND NOT ABLE TO MOVE HIS ARMS  . Stroke ([place])    2 TO  3 YRS AGO-AFFECTED HIS SPEECH--ABLE TO AMBULATE WITHOUT ASSIST AND DOES YARD WORK.  DECREASED HEARING IN BOTH EARS SINCE STROKE.  Marland Kitchen Vitamin D deficiency     Family History: Family History  Problem Relation Age of Onset  . Colon polyps Sister     Social History   Socioeconomic History  . Marital status: Single    Spouse name: Not on file  . Number of children: Not on file  . Years of education: Not on file  . Highest education level: Not on file  Occupational History  . Not on file  Tobacco Use  . Smoking status: Former Smoker    Packs/day: 1.00    Years: 35.00    Pack  years: 35.00    Types: Cigarettes    Quit date: 12/22/2011    Years since quitting: 9.0  . Smokeless tobacco: Never Used  Vaping Use  . Vaping Use: Never used  Substance and Sexual Activity  . Alcohol use: Yes    Comment: occosionally   . Drug use: No  . Sexual activity: Not on file  Other Topics Concern  . Not on file  Social History Narrative  . Not on file   Social Determinants of Health   Financial Resource Strain: Low Risk   . Difficulty of Paying Living Expenses: Not hard at all  Food Insecurity: Not on file  Transportation Needs: Not on file  Physical Activity: Not on file  Stress: Not on file  Social Connections: Not on file  Intimate Partner Violence: Not on file      Review of Systems  Constitutional: Negative for chills, fatigue and unexpected weight change.  HENT: Negative for congestion, postnasal drip, rhinorrhea, sneezing and sore throat.   Eyes: Negative for redness.  Respiratory: Negative for cough, chest tightness and shortness of breath.   Cardiovascular: Negative for chest pain and palpitations.  Gastrointestinal: Negative for abdominal pain, constipation, diarrhea, nausea and vomiting.  Genitourinary: Negative for dysuria and frequency.  Musculoskeletal: Negative for arthralgias, back pain, joint swelling and neck pain.  Skin: Negative for rash.  Neurological: Negative for tremors and numbness.  Hematological: Negative for adenopathy. Does not bruise/bleed easily.  Psychiatric/Behavioral: Negative for behavioral problems (Depression), sleep disturbance and suicidal ideas. The patient is not nervous/anxious.     Vital Signs: BP 118/78   Pulse 80   Temp 98.4 F (36.9 C)   Resp 16   Ht 5\' 7"  (1.702 m)   Wt 185 lb 3.2 oz (84 kg)   SpO2 100%   BMI 29.01 kg/m    Physical Exam Vitals reviewed.  Constitutional:      Appearance: Normal appearance. He is normal weight.  Cardiovascular:     Rate and Rhythm: Normal rate and regular rhythm.      Pulses: Normal pulses.     Heart sounds: Normal heart sounds.  Pulmonary:     Effort: Pulmonary effort is normal.     Breath sounds: Normal breath sounds.  Abdominal:     General: Abdomen is flat.     Palpations: Abdomen is soft.  Musculoskeletal:        General: Normal range of motion.     Cervical back: Normal range of motion.  Skin:    General: Skin is warm.  Neurological:     General: No focal deficit present.     Mental Status: He is alert and oriented to person, place, and time. Mental status is at baseline.  Psychiatric:  Mood and Affect: Mood normal.        Behavior: Behavior normal.        Thought Content: Thought content normal.        Judgment: Judgment normal.    Assessment/Plan: 1. History of seizure disorder Dilantin levels improving, encouraged to continue with daily medication as prescribed, continue to monitor, requesting refills - phenytoin (DILANTIN) 100 MG ER capsule; Take 4 capsules (400 mg total) by mouth at bedtime.  Dispense: 450 capsule; Refill: 1  2. History of CVA (cerebrovascular accident) Continue with statin and ASA therapy, residual effects remain stable  3. Essential hypertension BP and HR remain well controlled on present management, continue to monitor  General Counseling: caeleb batalla understanding of the findings of todays visit and agrees with plan of treatment. I have discussed any further diagnostic evaluation that may be needed or ordered today. We also reviewed his medications today. he has been encouraged to call the office with any questions or concerns that should arise related to todays visit.    Meds ordered this encounter  Medications  . phenytoin (DILANTIN) 100 MG ER capsule    Sig: Take 4 capsules (400 mg total) by mouth at bedtime.    Dispense:  450 capsule    Refill:  1    Time spent: 30 Minutes Time spent includes review of chart, medications, test results and follow-up plan with the patient.  This patient  was seen by Theodoro Grist AGNP-C in Collaboration with Dr Lavera Guise as a part of collaborative care agreement     Tanna Furry. Lynnie Koehler AGNP-C Internal medicine

## 2020-12-18 ENCOUNTER — Telehealth: Payer: Self-pay | Admitting: Pharmacist

## 2020-12-18 NOTE — Progress Notes (Addendum)
    Chronic Care Management Pharmacy Assistant   Name: Derek Webb  MRN: [id number] DOB: [birth year]   Reason for Encounter: Adherence Review  Medications: Outpatient Encounter Medications as of 12/18/2020  Medication Sig   aspirin 81 MG tablet Take 81 mg by mouth daily.   atorvastatin (LIPITOR) 80 MG tablet Take 1 tablet (80 mg total) by mouth daily.   budesonide-formoterol (SYMBICORT) 160-4.5 MCG/ACT inhaler Inhale 1 puff into the lungs 2 (two) times daily.   Cholecalciferol (VITAMIN D) 2000 UNITS CAPS Take by mouth. VITAMIN D 3 2000 IU  ONE SOFTGEL DAILY  --OTC   clopidogrel (PLAVIX) 75 MG tablet Take 1 tablet (75 mg total) by mouth daily.   enalapril (VASOTEC) 20 MG tablet Take 1 tablet (20 mg total) by mouth daily.   ezetimibe (ZETIA) 10 MG tablet Take 1 tablet (10 mg total) by mouth daily.   OXYGEN Inhale into the lungs. At night   pantoprazole (PROTONIX) 40 MG tablet Take 1 tablet (40 mg total) by mouth daily. IN AM   phenytoin (DILANTIN) 100 MG ER capsule Take 4 capsules (400 mg total) by mouth at bedtime.   pneumococcal 23 valent vaccine (PNEUMOVAX 23) 25 MCG/0.5ML injection Inject 0.60ml IM once   No facility-administered encounter medications on file as of 12/18/2020.    Reviewed the patients chart for any medical/health and/or medication changes there were not any changes at this time.  Patient was seen by Luiz Ochoa, NP on 12/15/20 for seizure disorder. There were not any medication changes.   Follow-Up:Pharmacist Review  Charlann Lange, [facility] Pharmacist Assistant [phone]

## 2020-12-20 DIAGNOSIS — J449 Chronic obstructive pulmonary disease, unspecified: Secondary | ICD-10-CM

## 2020-12-20 DIAGNOSIS — I10 Essential (primary) hypertension: Secondary | ICD-10-CM

## 2020-12-20 DIAGNOSIS — K219 Gastro-esophageal reflux disease without esophagitis: Secondary | ICD-10-CM

## 2020-12-21 DIAGNOSIS — J449 Chronic obstructive pulmonary disease, unspecified: Secondary | ICD-10-CM | POA: Diagnosis not present

## 2020-12-22 ENCOUNTER — Encounter: Payer: Self-pay | Admitting: Hospice and Palliative Medicine

## 2021-01-15 ENCOUNTER — Telehealth: Payer: Self-pay | Admitting: Pharmacist

## 2021-01-15 NOTE — Progress Notes (Addendum)
Chronic Care Management Pharmacy Assistant   Name: ORONDE HALLENBECK  MRN: [id number] DOB: 02-13-1951  Reason for Encounter: Disease State For HTN.    Conditions to be addressed/monitored: HTN, GERD, HLD.  Recent office visits:  None since 12/18/20  Recent consult visits:  None since 12/18/20  Hospital visits:  None since 12/18/20  Medications: Outpatient Encounter Medications as of 01/15/2021  Medication Sig   aspirin 81 MG tablet Take 81 mg by mouth daily.   atorvastatin (LIPITOR) 80 MG tablet Take 1 tablet (80 mg total) by mouth daily.   budesonide-formoterol (SYMBICORT) 160-4.5 MCG/ACT inhaler Inhale 1 puff into the lungs 2 (two) times daily.   Cholecalciferol (VITAMIN D) 2000 UNITS CAPS Take by mouth. VITAMIN D 3 2000 IU  ONE SOFTGEL DAILY  --OTC   clopidogrel (PLAVIX) 75 MG tablet Take 1 tablet (75 mg total) by mouth daily.   enalapril (VASOTEC) 20 MG tablet Take 1 tablet (20 mg total) by mouth daily.   ezetimibe (ZETIA) 10 MG tablet Take 1 tablet (10 mg total) by mouth daily.   OXYGEN Inhale into the lungs. At night   pantoprazole (PROTONIX) 40 MG tablet Take 1 tablet (40 mg total) by mouth daily. IN AM   phenytoin (DILANTIN) 100 MG ER capsule Take 4 capsules (400 mg total) by mouth at bedtime.   pneumococcal 23 valent vaccine (PNEUMOVAX 23) 25 MCG/0.5ML injection Inject 0.64ml IM once   No facility-administered encounter medications on file as of 01/15/2021.   Reviewed chart prior to disease state call. Spoke with patient regarding BP  Recent Office Vitals: BP Readings from Last 3 Encounters:  12/15/20 118/78  11/25/20 116/80  10/16/20 132/82   Pulse Readings from Last 3 Encounters:  12/15/20 80  11/25/20 77  10/16/20 89    Wt Readings from Last 3 Encounters:  12/15/20 185 lb 3.2 oz (84 kg)  11/25/20 184 lb 12.8 oz (83.8 kg)  10/16/20 189 lb 8 oz (86 kg)     Kidney Function Lab Results  Component Value Date/Time   CREATININE 0.71 (L) 12/09/2020 02:01 PM    CREATININE 0.74 (L) 10/13/2020 02:12 PM   GFRNONAA 95 10/13/2020 02:12 PM   GFRAA 110 10/13/2020 02:12 PM    BMP Latest Ref Rng & Units 12/09/2020 10/13/2020 08/06/2019  Glucose 65 - 99 mg/dL 87 83 94  BUN 8 - 27 mg/dL 7(L) 5(L) 8  Creatinine 0.76 - 1.27 mg/dL 0.71(L) 0.74(L) 0.70(L)  BUN/Creat Ratio 10 - 24 10 7(L) 11  Sodium 134 - 144 mmol/L 143 140 140  Potassium 3.5 - 5.2 mmol/L 4.6 4.6 4.5  Chloride 96 - 106 mmol/L 101 100 101  CO2 20 - 29 mmol/L 24 24 27   Calcium 8.6 - 10.2 mg/dL 10.0 9.8 9.7    Current antihypertensive regimen:  Enalapril 20 mg 1 tablet daily  How often are you checking your Blood Pressure? Patients sister stated he doesn't have a blood pressure at home. She stated he doesn't want one because he doesn't want to check since its normal.    Current home BP readings: N/A  What recent interventions/DTPs have been made by any provider to improve Blood Pressure control since last CPP Visit: None.   Any recent hospitalizations or ED visits since last visit with CPP? Patients sister stated no.   What diet changes have been made to improve Blood Pressure Control?  Patients sister stated she makes his meals sometimes but is not aware of what he eats all the  time.   What exercise is being done to improve your Blood Pressure Control?  Patients sister stated he walks to the store sometimes.  Adherence Review: Is the patient currently on ACE/ARB medication? Enalapril 20 mg  Does the patient have >5 day gap between last estimated fill dates? Per misc rpts, no.  Star Rating Drugs: Atorvastatin 80 mg 90 DS 12/15/20, Ezetimibe 10 mg 90 DS 11/14/20 Enalapril 20 mg 90 DS 11/14/20  Follow-Up:Pharmacist Review  Charlann Lange, RMA Clinical Pharmacist Assistant [phone]  10 minutes spent in review, coordination, and documentation.  Reviewed by: Beverly Milch, PharmD Clinical Pharmacist [facility] [phone]

## 2021-01-20 DIAGNOSIS — I10 Essential (primary) hypertension: Secondary | ICD-10-CM | POA: Diagnosis not present

## 2021-01-20 DIAGNOSIS — K219 Gastro-esophageal reflux disease without esophagitis: Secondary | ICD-10-CM | POA: Diagnosis not present

## 2021-01-20 DIAGNOSIS — J449 Chronic obstructive pulmonary disease, unspecified: Secondary | ICD-10-CM | POA: Diagnosis not present

## 2021-01-21 DIAGNOSIS — J449 Chronic obstructive pulmonary disease, unspecified: Secondary | ICD-10-CM | POA: Diagnosis not present

## 2021-02-20 DIAGNOSIS — J449 Chronic obstructive pulmonary disease, unspecified: Secondary | ICD-10-CM | POA: Diagnosis not present

## 2021-02-24 NOTE — Progress Notes (Deleted)
Chronic Care Management Pharmacy Note  11/24/2020 Name:  Derek [facility] MRN:  [id number] DOB:  March 30, 1951  Subjective: Derek [facility] is an 70 y.o. year old male who is a primary patient of Humphrey Rolls Timoteo Gaul, MD.  The CCM team was consulted for assistance with disease management and care coordination needs.    Engaged with patient by telephone for initial visit in response to provider referral for pharmacy case management and/or care coordination services.   Consent to Services:  The patient was given the following information about Chronic Care Management services today, agreed to services, and gave verbal consent: 1. CCM service includes personalized support from designated clinical staff supervised by the primary care provider, including individualized plan of care and coordination with other care providers 2. 24/7 contact phone numbers for assistance for urgent and routine care needs. 3. Service will only be billed when office clinical staff spend 20 minutes or more in a month to coordinate care. 4. Only one practitioner may furnish and bill the service in a calendar month. 5.The patient may stop CCM services at any time (effective at the end of the month) by phone call to the office staff. 6. The patient will be responsible for cost sharing (co-pay) of up to 20% of the service fee (after annual deductible is met). Patient agreed to services and consent obtained.  Patient Care Team: Lavera Guise, MD as PCP - General (Internal Medicine) Edythe Clarity, [facility] as Pharmacist (Pharmacist)  Recent office visits: None since last CCM call on 01/15/21  Recent consult visits: None since last CCM call on 01/15/21  Hospital visits: None in previous 6 months  Objective:  Lab Results  Component Value Date   CREATININE 0.74 (L) 10/13/2020   BUN 5 (L) 10/13/2020   GFRNONAA 95 10/13/2020   GFRAA 110 10/13/2020   NA 140 10/13/2020   K 4.6 10/13/2020   CALCIUM 9.8 10/13/2020   CO2 24 10/13/2020    GLUCOSE 83 10/13/2020    No results found for: HGBA1C, FRUCTOSAMINE, GFR, MICROALBUR  Last diabetic Eye exam: No results found for: HMDIABEYEEXA  Last diabetic Foot exam: No results found for: HMDIABFOOTEX   Lab Results  Component Value Date   CHOL 171 10/13/2020   HDL 64 10/13/2020   LDLCALC 80 10/13/2020   TRIG 160 (H) 10/13/2020    Hepatic Function Latest Ref Rng & Units 10/13/2020 08/06/2019  Total Protein 6.0 - 8.5 g/dL 7.1 7.3  Albumin 3.8 - 4.8 g/dL 4.2 4.1  AST 0 - 40 IU/L 25 23  ALT 0 - 44 IU/L 21 38  Alk Phosphatase 44 - 121 IU/L 148(H) 135(H)  Total Bilirubin 0.0 - 1.2 mg/dL <0.2 <0.2    Lab Results  Component Value Date/Time   TSH 1.690 10/13/2020 02:12 PM   TSH 1.990 08/06/2019 08:06 AM   FREET4 1.08 10/13/2020 02:12 PM   FREET4 0.93 08/06/2019 08:06 AM    CBC Latest Ref Rng & Units 10/13/2020 08/06/2019 05/06/2012  WBC 3.4 - 10.8 x10E3/uL 5.3 5.1 8.3  Hemoglobin 13.0 - 17.7 g/dL 13.7 14.3 12.9(L)  Hematocrit 37.5 - 51.0 % 39.9 42.2 38.1(L)  Platelets 150 - 450 x10E3/uL 355 347 357    No results found for: VD25OH  Clinical ASCVD: Yes  The ASCVD Risk score Mikey Bussing DC Jr., et al., 2013) failed to calculate for the following reasons:   The patient has a prior MI or stroke diagnosis    Depression screen [facility] 2/9 10/16/2020 07/03/2020 04/21/2020  Decreased Interest 0 0 0  Down, Depressed, Hopeless 0 0 0  PHQ - 2 Score 0 0 0      Social History   Tobacco Use  Smoking Status Former Smoker   Packs/day: 1.00   Years: 35.00   Pack years: 35.00   Types: Cigarettes   Quit date: 12/22/2011   Years since quitting: 8.9  Smokeless Tobacco Never Used   BP Readings from Last 3 Encounters:  10/16/20 132/82  07/03/20 127/80  04/21/20 132/86   Pulse Readings from Last 3 Encounters:  10/16/20 89  07/03/20 74  04/21/20 71   Wt Readings from Last 3 Encounters:  10/16/20 189 lb 8 oz (86 kg)  07/03/20 192 lb (87.1 kg)  04/21/20 194 lb 9.6 oz (88.3 kg)   BMI  Readings from Last 3 Encounters:  10/16/20 29.68 kg/m  07/03/20 30.07 kg/m  04/21/20 30.48 kg/m    Assessment/Interventions: Review of patient past medical history, allergies, medications, health status, including review of consultants reports, laboratory and other test data, was performed as part of comprehensive evaluation and provision of chronic care management services.   SDOH:  (Social Determinants of Health) assessments and interventions performed: Yes  SDOH Screenings   Alcohol Screen: Low Risk    Last Alcohol Screening Score (AUDIT): 2  Depression (PHQ2-9): Low Risk    PHQ-2 Score: 0  Financial Resource Strain: Not on file  Food Insecurity: Not on file  Housing: Not on file  Physical Activity: Not on file  Social Connections: Not on file  Stress: Not on file  Tobacco Use: Medium Risk   Smoking Tobacco Use: Former Smoker   Smokeless Tobacco Use: Never Used  Transportation Needs: Not on file    [place]  No Known Allergies  Medications Reviewed Today     Reviewed by Luiz Ochoa, NP (Nurse Practitioner) on 10/19/20 at [place] List Status: <None>   Medication Order Taking? Sig Documenting Provider Last Dose Status Informant  aspirin 81 MG tablet [id number] Yes Take 81 mg by mouth daily. [provider] Taking Active   atorvastatin (LIPITOR) 80 MG tablet [id number] Yes Take 1 tablet (80 mg total) by mouth daily. Ronnell Freshwater, NP Taking Active   budesonide-formoterol [facility]) 160-4.5 MCG/ACT inhaler [id number] Yes Inhale 1 puff into the lungs 2 (two) times daily. Kendell Bane, NP Taking Active   Cholecalciferol (VITAMIN D) 2000 UNITS CAPS [id number] Yes Take by mouth. VITAMIN D 3 2000 IU  ONE SOFTGEL DAILY  --OTC [provider] Taking Active   clopidogrel (PLAVIX) 75 MG tablet [id number] Yes Take 1 tablet (75 mg total) by mouth daily. Ronnell Freshwater, NP Taking Active   enalapril (VASOTEC) 20 MG tablet [id number] Yes Take 1 tablet  (20 mg total) by mouth daily. Ronnell Freshwater, NP Taking Active   ezetimibe (ZETIA) 10 MG tablet [id number] Yes Take 1 tablet (10 mg total) by mouth daily. Ronnell Freshwater, NP Taking Active   OXYGEN [id number] Yes Inhale into the lungs. At night [provider] Taking Active   pantoprazole (PROTONIX) 40 MG tablet [id number] Yes Take 1 tablet (40 mg total) by mouth daily. IN AM Lavera Guise, MD Taking Active   phenytoin (DILANTIN) 100 MG ER capsule [id number]  Take 4 capsules (400 mg total) by mouth at bedtime. Luiz Ochoa, NP  Active   pneumococcal 23 valent vaccine (PNEUMOVAX 23) 25 MCG/0.5ML injection [id number] Yes Inject 0.50ml IM once Ronnell Freshwater, NP Taking  Active             Patient Active Problem List   Diagnosis Date Noted   History of lung cancer 04/21/2020   Seizures ([place]) 07/20/2019   Encounter for therapeutic drug level monitoring 07/20/2019   Need for vaccination against Streptococcus pneumoniae using pneumococcal conjugate vaccine 13 03/05/2019   GERD (gastroesophageal reflux disease) 02/08/2018   Obstructive chronic bronchitis without exacerbation ([place]) 02/08/2018   Encounter for general adult medical examination with abnormal findings 12/18/2017   Atherosclerosis of aorta ([place]) 12/18/2017   Dysuria 12/18/2017   Cerebrovascular accident (CVA) ([place]) 11/01/2017   HOH (hard of hearing) 11/01/2017   Essential hypertension 11/01/2017   Hyperlipidemia 11/01/2017   Hx of adenomatous polyp of colon 12/16/2016   Squamous cell lung cancer ([place])    Lung cancer, 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[place]) 05/07/2012    Immunization History  Administered Date(s) Administered   Influenza Split 05/06/2012   Influenza-Unspecified 06/13/2017   PFIZER(Purple Top)SARS-COV-2 Vaccination 03/11/2020, 03/31/2020, 10/07/2020   Pneumococcal Polysaccharide-23 04/20/2020    Conditions to be addressed/monitored:  HTN, Hx of CVA, GERD, HLD, Seizures   Goals      Track and Manage My Blood  Pressure-Hypertension     Timeframe:  Long-Range Goal Priority:  High Start Date:   11/27/20                          Expected End Date:  05/29/21                     Follow Up Date 02/26/21   - check blood pressure 3 times per week - choose a place to take my blood pressure (home, clinic or office, retail store) - write blood pressure results in a log or diary    Why is this important?   You won't feel high blood pressure, but it can still hurt your blood vessels.  High blood pressure can cause heart or kidney problems. It can also cause a stroke.  Making lifestyle changes like losing a little weight or eating less salt will help.  Checking your blood pressure at home and at different times of the day can help to control blood pressure.  If the doctor prescribes medicine remember to take it the way the doctor ordered.  Call the office if you cannot afford the medicine or if there are questions about it.     Notes:         Patient Care Plan: General Pharmacy (Adult)     Problem Identified: HTN, Hx of CVA, GERD, HLD, Seizures   Priority: High  Onset Date: 11/27/2020     Long-Range Goal: Patient-Specific Goal   Start Date: 11/28/2020  Expected End Date: 05/30/2021  This Visit's Progress: On track  Priority: High  Note:   Current Barriers:  Unable to independently monitor therapeutic efficacy   Pharmacist Clinical Goal(s):  Patient will maintain control of blood pressure as evidenced by home monitoring or possible RPM.  adhere to prescribed medication regimen as evidenced by fill dates contact provider office for questions/concerns as evidenced notation of same in electronic health record through collaboration with PharmD and provider.   Interventions: 1:1 collaboration with Lavera Guise, MD regarding development and update of comprehensive plan of care as evidenced by provider attestation and co-signature Inter-disciplinary care team collaboration (see longitudinal plan  of care) Comprehensive medication review performed; medication list updated in electronic medical record  Hypertension (BP goal <140/90) -  Controlled -Current treatment: Enalapril 20mg  -Medications previously tried: quinapril -Current home readings: patient does not have a blood pressure cuff -Current dietary habits: reports good appetite, balanced diet -Current exercise habits: Patient walks outside in the neighborhood a few times per week -Denies hypotensive/hypertensive symptoms -Educated on BP goals and benefits of medications for prevention of heart attack, stroke and kidney damage; Exercise goal of 150 minutes per week; Importance of home blood pressure monitoring; -Counseled to monitor BP at home as able, document, and provide log at future appointments -Enalapril last filled 08/12/20 90ds per report - patient confirms the have 90 days supply filled 11/14/20 -Recommended to continue current medication  Hyperlipidemia/Hx of CVA: (LDL goal < 70) -Not ideally controlled -Current treatment: Zetia 10mg  ASA 81mg  Atorvastatin 80mg   Clopidogrel 75mg  -Medications previously tried: none noted  -Current dietary patterns: see above -Current exercise habits: see above -Educated on Cholesterol goals;  Benefits of statin for ASCVD risk reduction; Importance of limiting foods high in cholesterol;  - Atorvastatin last filled 09/16/20 90ds, Clopidogrel 08/12/20 90ds, Zetia 08/28/20 90ds - patient confirms he has plenty of supply on hand -Recommended to continue current medication, recommend repeat lipid panel after 6 months  GERD (Goal: Minimize symptoms) -Controlled -Current treatment  Pantoprazole 40mg  daily -Medications previously tried: none noted -Patient confirms correct administration -Denies current symptoms  -Recommended to continue current medication  Seizures (Goal: Prevent seizures) -Controlled -Current treatment  Phenytoin 100mg  ER -Medications previously tried: none  noted -Reports he has not had a seizure in a long time -Discussed the importance if medication adherence -Recommended to continue current medication   Patient Goals/Self-Care Activities Patient will:  - take medications as prescribed check blood pressure as able, document, and provide at future appointments target a minimum of 150 minutes of moderate intensity exercise weekly  Follow Up Plan: The care management team will reach out to the patient again over the next 120 days.            Medication Assistance: None required.  Patient affirms current coverage meets needs.  Patient's preferred pharmacy is:  [facility], [address] [place] [address] Phone: [phone] Fax: [place], [address] [place] [address] Phone: [phone] Fax: [phone]  Uses pill box? Yes Pt endorses 100% compliance  We discussed: Benefits of medication synchronization, packaging and delivery as well as enhanced pharmacist oversight with [employer]. Patient decided to: Continue current medication management strategy  Care Plan and Follow Up Patient Decision:  Patient agrees to Care Plan and Follow-up.  Plan: The care management team will reach out to the patient again over the next 120 days.  Beverly Milch, PharmD Clinical Pharmacist [facility] [phone]   Current Barriers:  Unable to independently monitor therapeutic efficacy   Pharmacist Clinical Goal(s):  Patient will maintain control of blood pressure as evidenced by home monitoring or possible RPM.  adhere to prescribed medication regimen as evidenced by fill dates contact provider office for questions/concerns as evidenced notation of same in electronic health record through collaboration with PharmD and provider.   Interventions: 1:1 collaboration with Lavera Guise, MD regarding  development and update of comprehensive plan of care as evidenced by provider attestation and co-signature Inter-disciplinary care team collaboration (see longitudinal plan of care) Comprehensive medication review performed; medication list updated in electronic medical record  Hypertension (BP  goal <140/90) -Controlled -Current treatment: Enalapril 20mg  -Medications previously tried: quinapril -Current home readings: patient does not have a blood pressure cuff -Current dietary habits: reports good appetite, balanced diet -Current exercise habits: Patient walks outside in the neighborhood a few times per week -Denies hypotensive/hypertensive symptoms -Educated on BP goals and benefits of medications for prevention of heart attack, stroke and kidney damage; Exercise goal of 150 minutes per week; Importance of home blood pressure monitoring; -Counseled to monitor BP at home as able, document, and provide log at future appointments -Enalapril last filled 08/12/20 90ds per report - patient confirms the have 90 days supply filled 11/14/20 -Recommended to continue current medication  Hyperlipidemia/Hx of CVA: (LDL goal < 70) -Not ideally controlled -Current treatment: Zetia 10mg  ASA 81mg  Atorvastatin 80mg   Clopidogrel 75mg  -Medications previously tried: none noted  -Current dietary patterns: see above -Current exercise habits: see above -Educated on Cholesterol goals;  Benefits of statin for ASCVD risk reduction; Importance of limiting foods high in cholesterol;  - Atorvastatin last filled 09/16/20 90ds, Clopidogrel 08/12/20 90ds, Zetia 08/28/20 90ds - patient confirms he has plenty of supply on hand -Recommended to continue current medication, recommend repeat lipid panel after 6 months  GERD (Goal: Minimize symptoms) -Controlled -Current treatment  Pantoprazole 40mg  daily -Medications previously tried: none noted -Patient confirms correct administration -Denies current symptoms   -Recommended to continue current medication  Seizures (Goal: Prevent seizures) -Controlled -Current treatment  Phenytoin 100mg  ER -Medications previously tried: none noted -Reports he has not had a seizure in a long time -Discussed the importance if medication adherence -Recommended to continue current medication   Patient Goals/Self-Care Activities Patient will:  - take medications as prescribed check blood pressure as able, document, and provide at future appointments target a minimum of 150 minutes of moderate intensity exercise weekly  Follow Up Plan: The care management team will reach out to the patient again over the next 120 days.

## 2021-03-03 ENCOUNTER — Telehealth: Payer: Self-pay

## 2021-03-23 DIAGNOSIS — J449 Chronic obstructive pulmonary disease, unspecified: Secondary | ICD-10-CM | POA: Diagnosis not present

## 2021-04-16 ENCOUNTER — Ambulatory Visit: Payer: Medicare Other | Admitting: Physician Assistant

## 2021-04-17 ENCOUNTER — Encounter: Payer: Self-pay | Admitting: Physician Assistant

## 2021-04-17 ENCOUNTER — Ambulatory Visit (INDEPENDENT_AMBULATORY_CARE_PROVIDER_SITE_OTHER): Payer: Medicare Other | Admitting: Physician Assistant

## 2021-04-17 ENCOUNTER — Other Ambulatory Visit: Payer: Self-pay

## 2021-04-17 DIAGNOSIS — E785 Hyperlipidemia, unspecified: Secondary | ICD-10-CM

## 2021-04-17 DIAGNOSIS — I700 Atherosclerosis of aorta: Secondary | ICD-10-CM | POA: Diagnosis not present

## 2021-04-17 DIAGNOSIS — Z8669 Personal history of other diseases of the nervous system and sense organs: Secondary | ICD-10-CM

## 2021-04-17 DIAGNOSIS — J449 Chronic obstructive pulmonary disease, unspecified: Secondary | ICD-10-CM

## 2021-04-17 DIAGNOSIS — I10 Essential (primary) hypertension: Secondary | ICD-10-CM | POA: Diagnosis not present

## 2021-04-17 MED ORDER — ATORVASTATIN CALCIUM 80 MG PO TABS: 80.0000 mg | ORAL_TABLET | Freq: Every day | ORAL | 3 refills | Status: DC

## 2021-04-17 MED ORDER — EZETIMIBE 10 MG PO TABS: 10.0000 mg | ORAL_TABLET | Freq: Every day | ORAL | 3 refills | Status: DC

## 2021-04-17 MED ORDER — CLOPIDOGREL BISULFATE 75 MG PO TABS: 75.0000 mg | ORAL_TABLET | Freq: Every day | ORAL | 3 refills | Status: DC

## 2021-04-17 MED ORDER — ENALAPRIL MALEATE 20 MG PO TABS: 20.0000 mg | ORAL_TABLET | Freq: Every day | ORAL | 3 refills | Status: DC

## 2021-04-17 MED ORDER — PHENYTOIN SODIUM EXTENDED 100 MG PO CAPS: 400.0000 mg | ORAL_CAPSULE | Freq: Every day | ORAL | 1 refills | Status: DC

## 2021-04-17 NOTE — Progress Notes (Signed)
[facility] [place], [place] [address]  Internal MEDICINE  Office Visit Note  Patient Name: Derek Webb  [id number]  [id number]  Date of Service: 04/17/2021  Chief Complaint  Patient presents with   Follow-up   Hypertension   Hyperlipidemia    HPI Pt is here for routine follow up and has no complaints today -BP at home not checked, does not have a monitor at home and will look into getting one since BP has been a little higher today though still controlled. Did take his medications this morning. -Needs refills today -Breathing has been good, has been using symbicort BID -Sleeping well -Does have a little fullness in face that he states has happened before. It is not bothersome and denies any lip or tongue swelling, difficulty swallowing, any itchiness, or rash, or any known allergies or foods consumed prior. States it just happens sometimes and goes away on its own  Current Medication: Outpatient Encounter Medications as of 04/17/2021  Medication Sig   aspirin 81 MG tablet Take 81 mg by mouth daily.   budesonide-formoterol (SYMBICORT) 160-4.5 MCG/ACT inhaler Inhale 1 puff into the lungs 2 (two) times daily.   Cholecalciferol (VITAMIN D) 2000 UNITS CAPS Take by mouth. VITAMIN D 3 2000 IU  ONE SOFTGEL DAILY  --OTC   OXYGEN Inhale into the lungs. At night   pantoprazole (PROTONIX) 40 MG tablet Take 1 tablet (40 mg total) by mouth daily. IN AM   [DISCONTINUED] atorvastatin (LIPITOR) 80 MG tablet Take 1 tablet (80 mg total) by mouth daily.   [DISCONTINUED] clopidogrel (PLAVIX) 75 MG tablet Take 1 tablet (75 mg total) by mouth daily.   [DISCONTINUED] enalapril (VASOTEC) 20 MG tablet Take 1 tablet (20 mg total) by mouth daily.   [DISCONTINUED] ezetimibe (ZETIA) 10 MG tablet Take 1 tablet (10 mg total) by mouth daily.   [DISCONTINUED] phenytoin (DILANTIN) 100 MG ER capsule Take 4 capsules (400 mg total) by mouth at bedtime.   [DISCONTINUED] pneumococcal 23  valent vaccine (PNEUMOVAX 23) 25 MCG/0.5ML injection Inject 0.16ml IM once   atorvastatin (LIPITOR) 80 MG tablet Take 1 tablet (80 mg total) by mouth daily.   clopidogrel (PLAVIX) 75 MG tablet Take 1 tablet (75 mg total) by mouth daily.   enalapril (VASOTEC) 20 MG tablet Take 1 tablet (20 mg total) by mouth daily.   ezetimibe (ZETIA) 10 MG tablet Take 1 tablet (10 mg total) by mouth daily.   phenytoin (DILANTIN) 100 MG ER capsule Take 4 capsules (400 mg total) by mouth at bedtime.   No facility-administered encounter medications on file as of 04/17/2021.    Surgical History: Past Surgical History:  Procedure Laterality Date   CAROTID SURGERY 2009 -LEFT     COLON POLYP EXCISION     COLON SURGERY     COLONOSCOPY WITH PROPOFOL N/A 04/18/2017   Procedure: COLONOSCOPY WITH PROPOFOL;  Surgeon: Manya Silvas, MD;  Location: [facility] ENDOSCOPY;  Service: Endoscopy;  Laterality: N/A;   ESOPHAGOGASTRODUODENOSCOPY     IR GENERIC HISTORICAL  01/21/2016   IR RADIOLOGIST EVAL & MGMT 01/21/2016 Aletta Edouard, MD [facility] INTERV RAD   IR GENERIC HISTORICAL  10/22/2014   IR RADIOLOGIST EVAL & MGMT 10/22/2014 Aletta Edouard, MD [facility] INTERV RAD   IR RADIOLOGIST EVAL & MGMT  02/02/2017   IR RADIOLOGIST EVAL & MGMT  03/08/2018   percutaneous biopsy      Medical History: Past Medical History:  Diagnosis Date   Alcoholism ([place])  COPD (chronic obstructive pulmonary disease) (HCC)    SEVERE COPD -OCCASIONALLY USES OXYGEN AT NIGHT-DOES NOT USE INHALERS ON REGULAR BASIS   Elevated cholesterol    GERD (gastroesophageal reflux disease)    HOH (hard of hearing)    Hypertension    Lung cancer ([place])    LEFT UPPER LUNG CARCINOMA-NOT A CANDIDATE FOR SURGICAL RESECTION BECAUSE OF HIS COPD AND POOR CANDIDATE FOR RADIATION BECAUSE OF TRANSPORTATION PROBLEMS   Seizures ([place])    CHRONIC DILATIN - PT STATES NO SEIZURES IN PAST COUPLE OF YEARS; PT STATES HIS SEIZURES CAUSED NUMBNESS OF ARMS AND HANDS AND NOT ABLE TO MOVE  HIS ARMS   Stroke ([place])    2 TO 3 YRS AGO-AFFECTED HIS SPEECH--ABLE TO AMBULATE WITHOUT ASSIST AND DOES YARD WORK.  DECREASED HEARING IN BOTH EARS SINCE STROKE.   Vitamin D deficiency     Family History: Family History  Problem Relation Age of Onset   Colon polyps Sister     Social History   Socioeconomic History   Marital status: Single    Spouse name: Not on file   Number of children: Not on file   Years of education: Not on file   Highest education level: Not on file  Occupational History   Not on file  Tobacco Use   Smoking status: Former    Packs/day: 1.00    Years: 35.00    Pack years: 35.00    Types: Cigarettes    Quit date: 12/22/2011    Years since quitting: 9.3   Smokeless tobacco: Never  Vaping Use   Vaping Use: Never used  Substance and Sexual Activity   Alcohol use: Yes    Comment: occosionally    Drug use: No   Sexual activity: Not on file  Other Topics Concern   Not on file  Social History Narrative   Not on file   Social Determinants of Health   Financial Resource Strain: Low Risk    Difficulty of Paying Living Expenses: Not hard at all  Food Insecurity: Not on file  Transportation Needs: Not on file  Physical Activity: Not on file  Stress: Not on file  Social Connections: Not on file  Intimate Partner Violence: Not on file      Review of Systems  Constitutional:  Negative for chills, fatigue and unexpected weight change.  HENT:  Negative for congestion, postnasal drip, rhinorrhea, sneezing and sore throat.   Eyes:  Negative for redness.  Respiratory:  Negative for cough, chest tightness and shortness of breath.   Cardiovascular:  Negative for chest pain and palpitations.  Gastrointestinal:  Negative for abdominal pain, constipation, diarrhea, nausea and vomiting.  Genitourinary:  Negative for dysuria and frequency.  Musculoskeletal:  Negative for arthralgias, back pain, joint swelling and neck pain.  Skin:  Negative for rash.   Neurological:  Negative for tremors and numbness.  Hematological:  Negative for adenopathy. Does not bruise/bleed easily.  Psychiatric/Behavioral:  Negative for behavioral problems (Depression), sleep disturbance and suicidal ideas. The patient is not nervous/anxious.    Vital Signs: BP 138/80 Comment: 142/82  Pulse 75   Temp 97.8 F (36.6 C)   Resp 16   Ht 5\' 7"  (1.702 m)   Wt 182 lb (82.6 kg)   SpO2 98%   BMI 28.51 kg/m    Physical Exam Vitals and nursing note reviewed.  Constitutional:      General: He is not in acute distress.    Appearance: He is well-developed. He is  not diaphoretic.  HENT:     Head: Normocephalic and atraumatic.     Mouth/Throat:     Pharynx: No oropharyngeal exudate.  Eyes:     Pupils: Pupils are equal, round, and reactive to light.  Neck:     Thyroid: No thyromegaly.     Vascular: No JVD.     Trachea: No tracheal deviation.  Cardiovascular:     Rate and Rhythm: Normal rate and regular rhythm.     Heart sounds: Normal heart sounds. No murmur heard.   No friction rub. No gallop.  Pulmonary:     Effort: Pulmonary effort is normal. No respiratory distress.     Breath sounds: Rhonchi present. No wheezing or rales.  Chest:     Chest wall: No tenderness.  Abdominal:     General: Bowel sounds are normal.     Palpations: Abdomen is soft.  Musculoskeletal:        General: Normal range of motion.     Cervical back: Normal range of motion and neck supple.  Lymphadenopathy:     Cervical: No cervical adenopathy.  Skin:    General: Skin is warm and dry.  Neurological:     Mental Status: He is alert and oriented to person, place, and time.     Cranial Nerves: No cranial nerve deficit.  Psychiatric:        Behavior: Behavior normal.        Thought Content: Thought content normal.        Judgment: Judgment normal.       Assessment/Plan: 1. Essential hypertension Stable, continue enalapril and will obtain cuff to monitor at home - enalapril  (VASOTEC) 20 MG tablet; Take 1 tablet (20 mg total) by mouth daily.  Dispense: 90 tablet; Refill: 3  2. History of seizure disorder Stable, continue current medication - phenytoin (DILANTIN) 100 MG ER capsule; Take 4 capsules (400 mg total) by mouth at bedtime.  Dispense: 450 capsule; Refill: 1  3. Hyperlipidemia, unspecified hyperlipidemia type Continue lipitor and zetia - ezetimibe (ZETIA) 10 MG tablet; Take 1 tablet (10 mg total) by mouth daily.  Dispense: 90 tablet; Refill: 3 - atorvastatin (LIPITOR) 80 MG tablet; Take 1 tablet (80 mg total) by mouth daily.  Dispense: 90 tablet; Refill: 3  4. Obstructive chronic bronchitis without exacerbation (HCC) Stable, continue inhalers as prescribed--followed by pulmonology  5. Atherosclerosis of aorta (HCC) - clopidogrel (PLAVIX) 75 MG tablet; Take 1 tablet (75 mg total) by mouth daily.  Dispense: 90 tablet; Refill: 3   General Counseling: Malic verbalizes understanding of the findings of todays visit and agrees with plan of treatment. I have discussed any further diagnostic evaluation that may be needed or ordered today. We also reviewed his medications today. he has been encouraged to call the office with any questions or concerns that should arise related to todays visit.    No orders of the defined types were placed in this encounter.   Meds ordered this encounter  Medications   phenytoin (DILANTIN) 100 MG ER capsule    Sig: Take 4 capsules (400 mg total) by mouth at bedtime.    Dispense:  450 capsule    Refill:  1   ezetimibe (ZETIA) 10 MG tablet    Sig: Take 1 tablet (10 mg total) by mouth daily.    Dispense:  90 tablet    Refill:  3   atorvastatin (LIPITOR) 80 MG tablet    Sig: Take 1 tablet (80 mg total) by mouth  daily.    Dispense:  90 tablet    Refill:  3   clopidogrel (PLAVIX) 75 MG tablet    Sig: Take 1 tablet (75 mg total) by mouth daily.    Dispense:  90 tablet    Refill:  3   enalapril (VASOTEC) 20 MG tablet     Sig: Take 1 tablet (20 mg total) by mouth daily.    Dispense:  90 tablet    Refill:  3    This patient was seen by Drema Dallas, PA-C in collaboration with Dr. Clayborn Bigness as a part of collaborative care agreement.   Total time spent:30 Minutes Time spent includes review of chart, medications, test results, and follow up plan with the patient.      Dr Lavera Guise Internal medicine

## 2021-04-17 NOTE — Patient Instructions (Signed)

## 2021-04-23 DIAGNOSIS — J449 Chronic obstructive pulmonary disease, unspecified: Secondary | ICD-10-CM | POA: Diagnosis not present

## 2021-05-14 ENCOUNTER — Other Ambulatory Visit: Payer: Self-pay | Admitting: Adult Health

## 2021-05-14 DIAGNOSIS — J449 Chronic obstructive pulmonary disease, unspecified: Secondary | ICD-10-CM

## 2021-05-15 ENCOUNTER — Telehealth: Payer: Self-pay | Admitting: Pharmacist

## 2021-05-15 NOTE — Progress Notes (Signed)
Chronic Care Management Pharmacy Assistant   Name: Derek Webb  MRN: [id number] DOB: 07-Jul-1951  Reason for Encounter: Disease State For HTN.   Conditions to be addressed/monitored: HTN, GERD, HLD.  Recent office visits:  04/17/21 Mylinda Latina, PA-C.  For follow-up. No medication changes.  Recent consult visits:  None since 01/15/21  Hospital visits:  None since 01/15/21  Medications: Outpatient Encounter Medications as of 05/15/2021  Medication Sig   aspirin 81 MG tablet Take 81 mg by mouth daily.   atorvastatin (LIPITOR) 80 MG tablet Take 1 tablet (80 mg total) by mouth daily.   budesonide-formoterol (SYMBICORT) 160-4.5 MCG/ACT inhaler Inhale 1 puff into the lungs 2 (two) times daily.   Cholecalciferol (VITAMIN D) 2000 UNITS CAPS Take by mouth. VITAMIN D 3 2000 IU  ONE SOFTGEL DAILY  --OTC   clopidogrel (PLAVIX) 75 MG tablet Take 1 tablet (75 mg total) by mouth daily.   enalapril (VASOTEC) 20 MG tablet Take 1 tablet (20 mg total) by mouth daily.   ezetimibe (ZETIA) 10 MG tablet Take 1 tablet (10 mg total) by mouth daily.   OXYGEN Inhale into the lungs. At night   pantoprazole (PROTONIX) 40 MG tablet Take 1 tablet (40 mg total) by mouth daily. IN AM   phenytoin (DILANTIN) 100 MG ER capsule Take 4 capsules (400 mg total) by mouth at bedtime.   No facility-administered encounter medications on file as of 05/15/2021.   Reviewed chart prior to disease state call. Spoke with patient regarding BP  Recent Office Vitals: BP Readings from Last 3 Encounters:  04/17/21 138/80  12/15/20 118/78  11/25/20 116/80   Pulse Readings from Last 3 Encounters:  04/17/21 75  12/15/20 80  11/25/20 77    Wt Readings from Last 3 Encounters:  04/17/21 182 lb (82.6 kg)  12/15/20 185 lb 3.2 oz (84 kg)  11/25/20 184 lb 12.8 oz (83.8 kg)     Kidney Function Lab Results  Component Value Date/Time   CREATININE 0.71 (L) 12/09/2020 02:01 PM   CREATININE 0.74 (L) 10/13/2020 02:12 PM    GFRNONAA 95 10/13/2020 02:12 PM   GFRAA 110 10/13/2020 02:12 PM    BMP Latest Ref Rng & Units 12/09/2020 10/13/2020 08/06/2019  Glucose 65 - 99 mg/dL 87 83 94  BUN 8 - 27 mg/dL 7(L) 5(L) 8  Creatinine 0.76 - 1.27 mg/dL 0.71(L) 0.74(L) 0.70(L)  BUN/Creat Ratio 10 - 24 10 7(L) 11  Sodium 134 - 144 mmol/L 143 140 140  Potassium 3.5 - 5.2 mmol/L 4.6 4.6 4.5  Chloride 96 - 106 mmol/L 101 100 101  CO2 20 - 29 mmol/L 24 24 27   Calcium 8.6 - 10.2 mg/dL 10.0 9.8 9.7    Current antihypertensive regimen:  Enalapril 20 mg 1 tablet daily  How often are you checking your Blood Pressure?  Patients sister stated he does not check his blood presure .  Current home BP readings: Patients sister stated he does not check his blood presure .  What recent interventions/DTPs have been made by any provider to improve Blood Pressure control since last CPP Visit: None.  Any recent hospitalizations or ED visits since last visit with CPP? Patient stated no.  What diet changes have been made to improve Blood Pressure Control?  Patients sister stated not much has changed in his diet.   What exercise is being done to improve your Blood Pressure Control?  Patients sister stated not much has changed in his diet.   Adherence  Review: Is the patient currently on ACE/ARB medication? Enalapril 20 mg 1 tablet daily  Does the patient have >5 day gap between last estimated fill dates? Per misc rpts, no.  Care Gaps:Patient is due for AWV.   Star Rating Drugs:Enalapril 20 mg  05/11/21 90 DS, Atorvastatin 80 mg 03/13/21 90 DS, Ezetimibe 10 mg 02/24/21 90 DS.  Follow-Up:Pharmacist Review  Charlann Lange, [place] Pharmacist Assistant [phone]

## 2021-05-22 DIAGNOSIS — I10 Essential (primary) hypertension: Secondary | ICD-10-CM | POA: Diagnosis not present

## 2021-05-22 DIAGNOSIS — J449 Chronic obstructive pulmonary disease, unspecified: Secondary | ICD-10-CM | POA: Diagnosis not present

## 2021-05-22 DIAGNOSIS — K219 Gastro-esophageal reflux disease without esophagitis: Secondary | ICD-10-CM | POA: Diagnosis not present

## 2021-05-23 DIAGNOSIS — J449 Chronic obstructive pulmonary disease, unspecified: Secondary | ICD-10-CM | POA: Diagnosis not present

## 2021-05-25 ENCOUNTER — Encounter: Payer: Self-pay | Admitting: Nurse Practitioner

## 2021-05-25 ENCOUNTER — Ambulatory Visit (INDEPENDENT_AMBULATORY_CARE_PROVIDER_SITE_OTHER): Payer: Medicare Other | Admitting: Nurse Practitioner

## 2021-05-25 ENCOUNTER — Other Ambulatory Visit: Payer: Self-pay

## 2021-05-25 VITALS — BP 135/82 | HR 78 | Temp 98.3°F | Resp 16 | Ht 67.0 in | Wt 181.4 lb

## 2021-05-25 DIAGNOSIS — Z85118 Personal history of other malignant neoplasm of bronchus and lung: Secondary | ICD-10-CM

## 2021-05-25 DIAGNOSIS — R0602 Shortness of breath: Secondary | ICD-10-CM | POA: Diagnosis not present

## 2021-05-25 DIAGNOSIS — J449 Chronic obstructive pulmonary disease, unspecified: Secondary | ICD-10-CM | POA: Diagnosis not present

## 2021-05-25 MED ORDER — BUDESONIDE-FORMOTEROL FUMARATE 160-4.5 MCG/ACT IN AERO: 1.0000 | INHALATION_SPRAY | Freq: Two times a day (BID) | RESPIRATORY_TRACT | 5 refills | Status: DC

## 2021-05-25 NOTE — Progress Notes (Signed)
[facility] [place], [place] [address]  Internal MEDICINE  Office Visit Note  Patient Name: Derek Webb  [id number]  [id number]  Date of Service: 05/25/2021  Chief Complaint  Patient presents with   Follow-up    refills   COPD    HPI Derek Webb presents for a follow up visit for COPD and symbicort refills. He did spirometry in office today. His FVC has decreased by approx 10% but FEV1 improved by 16%. FVC/FEV1 ratio improved by 21%. He reprots that he feels like he is breathing well and reports compliance with symbicort inhaler. He does not use rescue inhaler often.   Office Spirometry Results for today,05/25/21: FVC: 1.75 (-9.54% change from April 2022) FEV1: 0.92 (+10% change from April 2022) FEV1/FVC ratio: 52.87% (+21.58% change from April 2022)    Current Medication: Outpatient Encounter Medications as of 05/25/2021  Medication Sig   aspirin 81 MG tablet Take 81 mg by mouth daily.   atorvastatin (LIPITOR) 80 MG tablet Take 1 tablet (80 mg total) by mouth daily.   Cholecalciferol (VITAMIN D) 2000 UNITS CAPS Take by mouth. VITAMIN D 3 2000 IU  ONE SOFTGEL DAILY  --OTC   clopidogrel (PLAVIX) 75 MG tablet Take 1 tablet (75 mg total) by mouth daily.   enalapril (VASOTEC) 20 MG tablet Take 1 tablet (20 mg total) by mouth daily.   ezetimibe (ZETIA) 10 MG tablet Take 1 tablet (10 mg total) by mouth daily.   OXYGEN Inhale into the lungs. At night   pantoprazole (PROTONIX) 40 MG tablet Take 1 tablet (40 mg total) by mouth daily. IN AM   phenytoin (DILANTIN) 100 MG ER capsule Take 4 capsules (400 mg total) by mouth at bedtime.   [DISCONTINUED] budesonide-formoterol (SYMBICORT) 160-4.5 MCG/ACT inhaler Inhale 1 puff into the lungs 2 (two) times daily.   budesonide-formoterol (SYMBICORT) 160-4.5 MCG/ACT inhaler Inhale 1 puff into the lungs 2 (two) times daily.   No facility-administered encounter medications on file as of 05/25/2021.    Surgical History: Past  Surgical History:  Procedure Laterality Date   CAROTID SURGERY 2009 -LEFT     COLON POLYP EXCISION     COLON SURGERY     COLONOSCOPY WITH PROPOFOL N/A 04/18/2017   Procedure: COLONOSCOPY WITH PROPOFOL;  Surgeon: Manya Silvas, MD;  Location: [facility] ENDOSCOPY;  Service: Endoscopy;  Laterality: N/A;   ESOPHAGOGASTRODUODENOSCOPY     IR GENERIC HISTORICAL  01/21/2016   IR RADIOLOGIST EVAL & MGMT 01/21/2016 Aletta Edouard, MD [facility] INTERV RAD   IR GENERIC HISTORICAL  10/22/2014   IR RADIOLOGIST EVAL & MGMT 10/22/2014 Aletta Edouard, MD [facility] INTERV RAD   IR RADIOLOGIST EVAL & MGMT  02/02/2017   IR RADIOLOGIST EVAL & MGMT  03/08/2018   percutaneous biopsy      Medical History: Past Medical History:  Diagnosis Date   Alcoholism ([place])    COPD (chronic obstructive pulmonary disease) (HCC)    SEVERE COPD -OCCASIONALLY USES OXYGEN AT NIGHT-DOES NOT USE INHALERS ON REGULAR BASIS   Elevated cholesterol    GERD (gastroesophageal reflux disease)    HOH (hard of hearing)    Hypertension    Lung cancer ([place])    LEFT UPPER LUNG CARCINOMA-NOT A CANDIDATE FOR SURGICAL RESECTION BECAUSE OF HIS COPD AND POOR CANDIDATE FOR RADIATION BECAUSE OF TRANSPORTATION PROBLEMS   Seizures ([place])    CHRONIC DILATIN - PT STATES NO SEIZURES IN PAST COUPLE OF YEARS; PT STATES HIS SEIZURES CAUSED NUMBNESS OF ARMS AND HANDS AND  NOT ABLE TO MOVE HIS ARMS   Stroke ([place])    2 TO 3 YRS AGO-AFFECTED HIS SPEECH--ABLE TO AMBULATE WITHOUT ASSIST AND DOES YARD WORK.  DECREASED HEARING IN BOTH EARS SINCE STROKE.   Vitamin D deficiency     Family History: Family History  Problem Relation Age of Onset   Colon polyps Sister     Social History   Socioeconomic History   Marital status: Single    Spouse name: Not on file   Number of children: Not on file   Years of education: Not on file   Highest education level: Not on file  Occupational History   Not on file  Tobacco Use   Smoking status: Former    Packs/day: 1.00     Years: 35.00    Pack years: 35.00    Types: Cigarettes    Quit date: 12/22/2011    Years since quitting: 9.4   Smokeless tobacco: Never  Vaping Use   Vaping Use: Never used  Substance and Sexual Activity   Alcohol use: Yes    Comment: occosionally    Drug use: No   Sexual activity: Not on file  Other Topics Concern   Not on file  Social History Narrative   Not on file   Social Determinants of Health   Financial Resource Strain: Low Risk    Difficulty of Paying Living Expenses: Not hard at all  Food Insecurity: Not on file  Transportation Needs: Not on file  Physical Activity: Not on file  Stress: Not on file  Social Connections: Not on file  Intimate Partner Violence: Not on file      Review of Systems  Constitutional:  Negative for chills, fatigue and unexpected weight change.  HENT:  Negative for congestion, rhinorrhea, sneezing and sore throat.   Eyes:  Negative for redness.  Respiratory: Negative.  Negative for cough, chest tightness, shortness of breath and wheezing.   Cardiovascular: Negative.  Negative for chest pain and palpitations.  Gastrointestinal:  Negative for abdominal pain, constipation, diarrhea, nausea and vomiting.  Genitourinary:  Negative for dysuria and frequency.  Musculoskeletal:  Negative for arthralgias, back pain, joint swelling and neck pain.  Skin:  Negative for rash.  Neurological: Negative.  Negative for tremors and numbness.  Hematological:  Negative for adenopathy. Does not bruise/bleed easily.  Psychiatric/Behavioral:  Negative for behavioral problems (Depression), sleep disturbance and suicidal ideas. The patient is not nervous/anxious.    Vital Signs: BP 135/82   Pulse 78   Temp 98.3 F (36.8 C)   Resp 16   Ht 5\' 7"  (1.702 m)   Wt 181 lb 6.4 oz (82.3 kg)   SpO2 96%   BMI 28.41 kg/m    Physical Exam Vitals reviewed.  Constitutional:      General: He is awake. He is not in acute distress.    Appearance: Normal appearance.  He is well-developed and well-groomed. He is obese. He is not ill-appearing or toxic-appearing.  HENT:     Head: Normocephalic and atraumatic.  Eyes:     Extraocular Movements: Extraocular movements intact.     Pupils: Pupils are equal, round, and reactive to light.  Cardiovascular:     Rate and Rhythm: Normal rate and regular rhythm.     Heart sounds: Normal heart sounds. No murmur heard.   No friction rub. No gallop.  Pulmonary:     Effort: Pulmonary effort is normal. No respiratory distress.     Breath sounds: Normal breath sounds. No  wheezing, rhonchi or rales.  Neurological:     Mental Status: He is alert and oriented to person, place, and time.     Cranial Nerves: No cranial nerve deficit.     Coordination: Coordination normal.     Gait: Gait normal.  Psychiatric:        Mood and Affect: Mood normal.        Behavior: Behavior normal. Behavior is cooperative.     Assessment/Plan: 1. Obstructive chronic bronchitis without exacerbation ([place]) Arlyce Harman done today, see problem # 2. Continue symbicort twice daily as prescribed, refills ordered.  - budesonide-formoterol (SYMBICORT) 160-4.5 MCG/ACT inhaler; Inhale 1 puff into the lungs 2 (two) times daily.  Dispense: 1 each; Refill: 5  2. SOB (shortness of breath) - Spirometry with Graph showed FEV1 of 35% which was improved from last spiro FEV1 of 25%.  - Spirometry with Graph  3. History of lung cancer CT stable in July 2021, will continue scans as appropriate   General Counseling: arsen mangione understanding of the findings of todays visit and agrees with plan of treatment. I have discussed any further diagnostic evaluation that may be needed or ordered today. We also reviewed his medications today. he has been encouraged to call the office with any questions or concerns that should arise related to todays visit.    Orders Placed This Encounter  Procedures   Spirometry with Graph    Meds ordered this encounter   Medications   budesonide-formoterol (SYMBICORT) 160-4.5 MCG/ACT inhaler    Sig: Inhale 1 puff into the lungs 2 (two) times daily.    Dispense:  1 each    Refill:  5    Return in about 6 months (around 11/23/2021) for F/U, pulmonary only with DSK or Parley Pidcock.   Total time spent:20 Minutes Time spent includes review of chart, medications, test results, and follow up plan with the patient.    Controlled Substance Database was reviewed by me.  This patient was seen by Jonetta Osgood, FNP-C in collaboration with Dr. Clayborn Bigness as a part of collaborative care agreement.   Ilhan Madan R. Valetta Fuller, MSN, FNP-C Internal medicine

## 2021-05-29 NOTE — Progress Notes (Signed)
Chronic Care Management Pharmacy Note  11/24/2020 Name:  Derek Webb MRN:  [id number] DOB:  10-12-1950  Subjective: Derek Webb is an 70 y.o. year old male who is a primary patient of Humphrey Rolls Timoteo Gaul, MD.  The CCM team was consulted for assistance with disease management and care coordination needs.    Engaged with patient by telephone for follow up visit in response to provider referral for pharmacy case management and/or care coordination services.   Consent to Services:  The patient was given the following information about Chronic Care Management services today, agreed to services, and gave verbal consent: 1. CCM service includes personalized support from designated clinical staff supervised by the primary care provider, including individualized plan of care and coordination with other care providers 2. 24/7 contact phone numbers for assistance for urgent and routine care needs. 3. Service will only be billed when office clinical staff spend 20 minutes or more in a month to coordinate care. 4. Only one practitioner may furnish and bill the service in a calendar month. 5.The patient may stop CCM services at any time (effective at the end of the month) by phone call to the office staff. 6. The patient will be responsible for cost sharing (co-pay) of up to 20% of the service fee (after annual deductible is met). Patient agreed to services and consent obtained.  Patient Care Team: Lavera Guise, MD as PCP - General (Internal Medicine) Edythe Clarity, [facility] as Pharmacist (Pharmacist)  Recent office visits: 05/25/21 Valetta Fuller) - spirometry has improved, he is adherent with the Symbicort daily - no changes to medication at this visit 10/16/20 Kenton Kingfisher) - difficulty with medication adherence. No changes to medication, suggest sister help with medication administration.  04/21/20 ([place]) - No med changes Recent consult visits: None recent  Hospital visits: None in previous 6  months  Objective:  Lab Results  Component Value Date   CREATININE 0.74 (L) 10/13/2020   BUN 5 (L) 10/13/2020   GFRNONAA 95 10/13/2020   GFRAA 110 10/13/2020   NA 140 10/13/2020   K 4.6 10/13/2020   CALCIUM 9.8 10/13/2020   CO2 24 10/13/2020   GLUCOSE 83 10/13/2020    No results found for: HGBA1C, FRUCTOSAMINE, GFR, MICROALBUR  Last diabetic Eye exam: No results found for: HMDIABEYEEXA  Last diabetic Foot exam: No results found for: HMDIABFOOTEX   Lab Results  Component Value Date   CHOL 171 10/13/2020   HDL 64 10/13/2020   LDLCALC 80 10/13/2020   TRIG 160 (H) 10/13/2020    Hepatic Function Latest Ref Rng & Units 10/13/2020 08/06/2019  Total Protein 6.0 - 8.5 g/dL 7.1 7.3  Albumin 3.8 - 4.8 g/dL 4.2 4.1  AST 0 - 40 IU/L 25 23  ALT 0 - 44 IU/L 21 38  Alk Phosphatase 44 - 121 IU/L 148(H) 135(H)  Total Bilirubin 0.0 - 1.2 mg/dL <0.2 <0.2    Lab Results  Component Value Date/Time   TSH 1.690 10/13/2020 02:12 PM   TSH 1.990 08/06/2019 08:06 AM   FREET4 1.08 10/13/2020 02:12 PM   FREET4 0.93 08/06/2019 08:06 AM    CBC Latest Ref Rng & Units 10/13/2020 08/06/2019 05/06/2012  WBC 3.4 - 10.8 x10E3/uL 5.3 5.1 8.3  Hemoglobin 13.0 - 17.7 g/dL 13.7 14.3 12.9(L)  Hematocrit 37.5 - 51.0 % 39.9 42.2 38.1(L)  Platelets 150 - 450 x10E3/uL 355 347 357    No results found for: VD25OH  Clinical ASCVD: Yes  The ASCVD Risk score Mikey Bussing  DC Jr., et al., 2013) failed to calculate for the following reasons:   The patient has a prior MI or stroke diagnosis    Depression screen [facility] 2/9 10/16/2020 07/03/2020 04/21/2020  Decreased Interest 0 0 0  Down, Depressed, Hopeless 0 0 0  PHQ - 2 Score 0 0 0      Social History   Tobacco Use  Smoking Status Former Smoker   Packs/day: 1.00   Years: 35.00   Pack years: 35.00   Types: Cigarettes   Quit date: 12/22/2011   Years since quitting: 8.9  Smokeless Tobacco Never Used   BP Readings from Last 3 Encounters:  10/16/20 132/82   07/03/20 127/80  04/21/20 132/86   Pulse Readings from Last 3 Encounters:  10/16/20 89  07/03/20 74  04/21/20 71   Wt Readings from Last 3 Encounters:  10/16/20 189 lb 8 oz (86 kg)  07/03/20 192 lb (87.1 kg)  04/21/20 194 lb 9.6 oz (88.3 kg)   BMI Readings from Last 3 Encounters:  10/16/20 29.68 kg/m  07/03/20 30.07 kg/m  04/21/20 30.48 kg/m    Assessment/Interventions: Review of patient past medical history, allergies, medications, health status, including review of consultants reports, laboratory and other test data, was performed as part of comprehensive evaluation and provision of chronic care management services.   SDOH:  (Social Determinants of Health) assessments and interventions performed: Yes  SDOH Screenings   Alcohol Screen: Low Risk    Last Alcohol Screening Score (AUDIT): 2  Depression (PHQ2-9): Low Risk    PHQ-2 Score: 0  Financial Resource Strain: Not on file  Food Insecurity: Not on file  Housing: Not on file  Physical Activity: Not on file  Social Connections: Not on file  Stress: Not on file  Tobacco Use: Medium Risk   Smoking Tobacco Use: Former Smoker   Smokeless Tobacco Use: Never Used  Transportation Needs: Not on file    [place]  No Known Allergies  Medications Reviewed Today     Reviewed by Luiz Ochoa, NP (Nurse Practitioner) on 10/19/20 at [place] List Status: <None>   Medication Order Taking? Sig Documenting Provider Last Dose Status Informant  aspirin 81 MG tablet [id number] Yes Take 81 mg by mouth daily. [provider] Taking Active   atorvastatin (LIPITOR) 80 MG tablet [id number] Yes Take 1 tablet (80 mg total) by mouth daily. Ronnell Freshwater, NP Taking Active   budesonide-formoterol [facility]) 160-4.5 MCG/ACT inhaler [id number] Yes Inhale 1 puff into the lungs 2 (two) times daily. Kendell Bane, NP Taking Active   Cholecalciferol (VITAMIN D) 2000 UNITS CAPS [id number] Yes Take by mouth. VITAMIN D 3 2000  IU  ONE SOFTGEL DAILY  --OTC [provider] Taking Active   clopidogrel (PLAVIX) 75 MG tablet [id number] Yes Take 1 tablet (75 mg total) by mouth daily. Ronnell Freshwater, NP Taking Active   enalapril (VASOTEC) 20 MG tablet [id number] Yes Take 1 tablet (20 mg total) by mouth daily. Ronnell Freshwater, NP Taking Active   ezetimibe (ZETIA) 10 MG tablet [id number] Yes Take 1 tablet (10 mg total) by mouth daily. Ronnell Freshwater, NP Taking Active   OXYGEN [id number] Yes Inhale into the lungs. At night [provider] Taking Active   pantoprazole (PROTONIX) 40 MG tablet [id number] Yes Take 1 tablet (40 mg total) by mouth daily. IN AM Lavera Guise, MD Taking Active   phenytoin (DILANTIN) 100 MG ER capsule [id number]  Take 4 capsules (400  mg total) by mouth at bedtime. Luiz Ochoa, NP  Active   pneumococcal 23 valent vaccine (PNEUMOVAX 23) 25 MCG/0.5ML injection [id number] Yes Inject 0.64m IM once BRonnell Freshwater NP Taking Active             Patient Active Problem List   Diagnosis Date Noted   History of lung cancer 04/21/2020   Seizures ([facility] 07/20/2019   Encounter for therapeutic drug level monitoring 07/20/2019   Need for vaccination against Streptococcus pneumoniae using pneumococcal conjugate vaccine 13 03/05/2019   GERD (gastroesophageal reflux disease) 02/08/2018   Obstructive chronic bronchitis without exacerbation ([place] 02/08/2018   Encounter for general adult medical examination with abnormal findings 12/18/2017   Atherosclerosis of aorta ([place] 12/18/2017   Dysuria 12/18/2017   Cerebrovascular [place] (CVA) ([place] 11/01/2017   HOH (hard of hearing) 11/01/2017   Essential hypertension 11/01/2017   Hyperlipidemia 11/01/2017   Hx of adenomatous polyp of colon 12/16/2016   Squamous cell lung cancer ([place]    Lung cancer, 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[place] 05/07/2012    Immunization History  Administered Date(s) Administered   Influenza Split 05/06/2012   Influenza-Unspecified  06/13/2017   PFIZER(Purple Top)SARS-COV-2 Vaccination 03/11/2020, 03/31/2020, 10/07/2020   Pneumococcal Polysaccharide-23 04/20/2020    Conditions to be addressed/monitored:  HTN, Hx of CVA, GERD, HLD, Seizures   Goals      Track and Manage My Blood Pressure-Hypertension     Timeframe:  Long-Range Goal Priority:  High Start Date:   11/27/20                          Expected End Date:  05/29/21                     Follow Up Date 02/26/21   - check blood pressure 3 times per week - choose a place to take my blood pressure (home, clinic or office, retail store) - write blood pressure results in a log or diary    Why is this important?   You won't feel high blood pressure, but it can still hurt your blood vessels.  High blood pressure can cause heart or kidney problems. It can also cause a stroke.  Making lifestyle changes like losing a little weight or eating less salt will help.  Checking your blood pressure at home and at different times of the day can help to control blood pressure.  If the doctor prescribes medicine remember to take it the way the doctor ordered.  Call the office if you cannot afford the medicine or if there are questions about it.     Notes:        Patient Care Plan: General Pharmacy (Adult)     Problem Identified: HTN, Hx of CVA, GERD, HLD, Seizures   Priority: High  Onset Date: 11/27/2020     Long-Range Goal: Patient-Specific Goal   Start Date: 11/28/2020  Expected End Date: 05/30/2021  Recent Progress: On track  Priority: High  Note:   Current Barriers:  Unable to independently monitor therapeutic efficacy   Pharmacist Clinical Goal(s):  Patient will maintain control of blood pressure as evidenced by home monitoring or possible RPM.  adhere to prescribed medication regimen as evidenced by fill dates contact provider office for questions/concerns as evidenced notation of same in electronic health record through collaboration with PharmD and  provider.   Interventions: 1:1 collaboration with KLavera Guise MD regarding development and update of comprehensive plan of care  as evidenced by provider attestation and co-signature Inter-disciplinary care team collaboration (see longitudinal plan of care) Comprehensive medication review performed; medication list updated in electronic medical record  Hypertension (BP goal <140/90) -Controlled -Current treatment: Enalapril 90m -Medications previously tried: quinapril -Current home readings: patient does not have a blood pressure cuff -Current dietary habits: reports good appetite, balanced diet -Current exercise habits: Patient walks outside in the neighborhood a few times per week -Denies hypotensive/hypertensive symptoms -Educated on BP goals and benefits of medications for prevention of heart attack, stroke and kidney damage; Exercise goal of 150 minutes per week; Importance of home blood pressure monitoring; -Counseled to monitor BP at home as able, document, and provide log at future appointments -Enalapril last filled 08/12/20 90ds per report - patient confirms the have 90 days supply filled 11/14/20 -Recommended to continue current medication  Update 06/03/21 BP controlled at home per [place] sister.   No changes to meds.  Reviewed adherence and last fill on enalapril was 05/11/21 90ds - shows adherence Continue current meds for now.  Hyperlipidemia/Hx of CVA: (LDL goal < 70) -Not ideally controlled -Current treatment: Zetia 158mASA 8164mtorvastatin 15m33mlopidogrel 75mg52mdications previously tried: none noted  -Current dietary patterns: see above -Current exercise habits: see above -Educated on Cholesterol goals;  Benefits of statin for ASCVD risk reduction; Importance of limiting foods high in cholesterol;  - Atorvastatin last filled 09/16/20 90ds, Clopidogrel 08/12/20 90ds, Zetia 08/28/20 90ds - patient confirms he has plenty of supply on  hand -Recommended to continue current medication, recommend repeat lipid panel after 6 months  Update 06/03/21 Atorvastatin 15mg 46m filled 03/13/21 90ds Denies any changes to med and reports 100% adherence. LDL still not quite goal but has not has rechecked. Recommend recheck lipids, continue current meds for now.  GERD (Goal: Minimize symptoms) -Controlled -Current treatment  Pantoprazole 40mg d74m -Medications previously tried: none noted -Patient confirms correct administration -Denies current symptoms  -Recommended to continue current medication  Seizures (Goal: Prevent seizures) -Controlled -Current treatment  Phenytoin 100mg ER26mdications previously tried: none noted -Reports he has not had a seizure in a long time -Discussed the importance if medication adherence -Recommended to continue current medication   Patient Goals/Self-Care Activities Patient will:  - take medications as prescribed check blood pressure as able, document, and provide at future appointments target a minimum of 150 minutes of moderate intensity exercise weekly  Follow Up Plan: The care management team will reach out to the patient again over the next 120 days.               Medication Assistance: None required.  Patient affirms current coverage meets needs.  [id number]-[phone] box? Yes Pt endorses 100% compliance  We discussed: Benefits of medication synchronization, packaging and delivery as well as enhanced pharmacist oversight with [employer]. Patient decided to: Continue current medication management strategy  Care Plan and Follow Up Patient Decision:  Patient agrees to Care  Plan and Follow-up.  Plan: The care management team will reach out to the patient again over the next 120 days.  ChristiaBeverly Milch Clinical Pharmacist [facility], [phone]

## 2021-06-03 ENCOUNTER — Ambulatory Visit: Payer: Medicare Other | Admitting: Pharmacist

## 2021-06-03 NOTE — Patient Instructions (Addendum)
Visit Information   Goals Addressed             This Visit's Progress    Track and Manage My Blood Pressure-Hypertension   On track    Timeframe:  Long-Range Goal Priority:  High Start Date:   11/27/20                          Expected End Date:  05/29/21                     Follow Up Date 02/26/21   - check blood pressure 3 times per week - choose a place to take my blood pressure (home, clinic or office, retail store) - write blood pressure results in a log or diary    Why is this important?   You won't feel high blood pressure, but it can still hurt your blood vessels.  High blood pressure can cause heart or kidney problems. It can also cause a stroke.  Making lifestyle changes like losing a little weight or eating less salt will help.  Checking your blood pressure at home and at different times of the day can help to control blood pressure.  If the doctor prescribes medicine remember to take it the way the doctor ordered.  Call the office if you cannot afford the medicine or if there are questions about it.     Notes:        Patient Care Plan: General Pharmacy (Adult)     Problem Identified: HTN, Hx of CVA, GERD, HLD, Seizures   Priority: High  Onset Date: 11/27/2020     Long-Range Goal: Patient-Specific Goal   Start Date: 11/28/2020  Expected End Date: 05/30/2021  Recent Progress: On track  Priority: High  Note:   Current Barriers:  Unable to independently monitor therapeutic efficacy   Pharmacist Clinical Goal(s):  Patient will maintain control of blood pressure as evidenced by home monitoring or possible RPM.  adhere to prescribed medication regimen as evidenced by fill dates contact provider office for questions/concerns as evidenced notation of same in electronic health record through collaboration with PharmD and provider.   Interventions: 1:1 collaboration with Lavera Guise, MD regarding development and update of comprehensive plan of care as evidenced  by provider attestation and co-signature Inter-disciplinary care team collaboration (see longitudinal plan of care) Comprehensive medication review performed; medication list updated in electronic medical record  Hypertension (BP goal <140/90) -Controlled -Current treatment: Enalapril 20mg  -Medications previously tried: quinapril -Current home readings: patient does not have a blood pressure cuff -Current dietary habits: reports good appetite, balanced diet -Current exercise habits: Patient walks outside in the neighborhood a few times per week -Denies hypotensive/hypertensive symptoms -Educated on BP goals and benefits of medications for prevention of heart attack, stroke and kidney damage; Exercise goal of 150 minutes per week; Importance of home blood pressure monitoring; -Counseled to monitor BP at home as able, document, and provide log at future appointments -Enalapril last filled 08/12/20 90ds per report - patient confirms the have 90 days supply filled 11/14/20 -Recommended to continue current medication  Update 06/03/21 BP controlled at home per [place] his sister.   No changes to meds.  Reviewed adherence and last fill on enalapril was 05/11/21 90ds - shows adherence Continue current meds for now.  Hyperlipidemia/Hx of CVA: (LDL goal < 70) -Not ideally controlled -Current treatment: Zetia 10mg  ASA 81mg  Atorvastatin 80mg   Clopidogrel 75mg  -Medications previously tried: none  noted  -Current dietary patterns: see above -Current exercise habits: see above -Educated on Cholesterol goals;  Benefits of statin for ASCVD risk reduction; Importance of limiting foods high in cholesterol;  - Atorvastatin last filled 09/16/20 90ds, Clopidogrel 08/12/20 90ds, Zetia 08/28/20 90ds - patient confirms he has plenty of supply on hand -Recommended to continue current medication, recommend repeat lipid panel after 6 months  Update 06/03/21 Atorvastatin 80mg  last filled 03/13/21  90ds Denies any changes to med and reports 100% adherence. LDL still not quite goal but has not has rechecked. Recommend recheck lipids, continue current meds for now.  GERD (Goal: Minimize symptoms) -Controlled -Current treatment  Pantoprazole 40mg  daily -Medications previously tried: none noted -Patient confirms correct administration -Denies current symptoms  -Recommended to continue current medication  Seizures (Goal: Prevent seizures) -Controlled -Current treatment  Phenytoin 100mg  ER -Medications previously tried: none noted -Reports he has not had a seizure in a long time -Discussed the importance if medication adherence -Recommended to continue current medication   Patient Goals/Self-Care Activities Patient will:  - take medications as prescribed check blood pressure as able, document, and provide at future appointments target a minimum of 150 minutes of moderate intensity exercise weekly  Follow Up Plan: The care management team will reach out to the patient again over the next 120 days.             Patient verbalizes understanding of instructions provided today and agrees to view in [place].  Telephone follow up appointment with pharmacy team member scheduled for: 6 months  Edythe Clarity, [place]

## 2021-06-16 ENCOUNTER — Other Ambulatory Visit: Payer: Self-pay

## 2021-06-22 DIAGNOSIS — I10 Essential (primary) hypertension: Secondary | ICD-10-CM | POA: Diagnosis not present

## 2021-06-22 DIAGNOSIS — K219 Gastro-esophageal reflux disease without esophagitis: Secondary | ICD-10-CM | POA: Diagnosis not present

## 2021-06-22 DIAGNOSIS — J449 Chronic obstructive pulmonary disease, unspecified: Secondary | ICD-10-CM | POA: Diagnosis not present

## 2021-06-23 DIAGNOSIS — J449 Chronic obstructive pulmonary disease, unspecified: Secondary | ICD-10-CM | POA: Diagnosis not present

## 2021-07-09 ENCOUNTER — Ambulatory Visit: Payer: Medicare Other | Admitting: Physician Assistant

## 2021-07-23 DIAGNOSIS — J449 Chronic obstructive pulmonary disease, unspecified: Secondary | ICD-10-CM | POA: Diagnosis not present

## 2021-08-04 ENCOUNTER — Ambulatory Visit: Payer: Self-pay | Admitting: Student-PharmD

## 2021-08-04 DIAGNOSIS — I10 Essential (primary) hypertension: Secondary | ICD-10-CM

## 2021-08-04 DIAGNOSIS — E782 Mixed hyperlipidemia: Secondary | ICD-10-CM

## 2021-08-04 NOTE — Progress Notes (Signed)
Hypertension (HTN) Review Call  Derek Webb, Derek Webb [id number] 11 years, Male  DOB: [birth year]  M: [phone]  Hypertension Review  Completed by Charlann Lange on 07/30/2021  Chart Review Is the patient enrolled in RPM with BP Monitor?: No BP #1 reading (last): 135/82 on: 05/24/2021 BP #2 reading: 138/80 on: 04/16/2021 BP #3 reading: 118/78 on: 12/14/2020 Any of the last 3 BP > 140/90 mmHg?: No What recent interventions/DTPs have been made by any provider to improve the patient's conditions in the last 3 months?: None.  Any recent hospitalizations or ED visits since last visit with CPP?: No  Adherence Review Adherence rates for STAR metric medications:  Atorvastatin 80 mg 90 DS 03/13/21, 12/15/20. Enalapril 20 mg 90 DS 05/11/21, 02/11/21 Adherence rates for medications indicated for disease state being reviewed:  Atorvastatin 80 mg 90 DS 03/13/21, 12/15/20. Enalapril 20 mg 90 DS 05/11/21, 02/11/21 Does the patient have >5 day gap between last estimated fill dates for any of the above medications?: Yes Reasons for medication gaps: Patients sister stated she gets all of his medications for him but does not live with him. She stated he sometimes forgets but most of the time he takes his medications as prescribed.   Disease State Questions Able to connect with the Patient?: Yes Is the patient monitoring his/her BP?: No Review recommendations from CPP's note of how often patient should be checking and encourage monitoring blood pressures if patient has history of high BP.: Done Educate patient to inform proper points on checking BP at home:: Sit with feet flat on the floor, arm at heart level., Do not drink caffeine or smoke a cigarette at least 30 min. prior to checking. What diet changes have you made to improve your Blood Pressure Control?: eating more home-cooked meals What exercise are you doing to improve your Blood Pressure Control?: walking  Pharmacist Review  Adherence gaps  identified?: Yes Details: Late to refill both Enalapril and Atorvastatin. Patient does state some doses missed but mostly compliant. Drug Therapy Problems identified?: No Assessment: Controlled Other notes: Will evaluate level of adherence at next appt and provide counseling points.  9 minutes spent in review, coordination, and documentation.  Reviewed by: Alena Bills, PharmD Clinical Pharmacist [phone]

## 2021-08-21 DIAGNOSIS — J449 Chronic obstructive pulmonary disease, unspecified: Secondary | ICD-10-CM

## 2021-08-21 DIAGNOSIS — K219 Gastro-esophageal reflux disease without esophagitis: Secondary | ICD-10-CM

## 2021-08-21 DIAGNOSIS — I10 Essential (primary) hypertension: Secondary | ICD-10-CM

## 2021-08-23 DIAGNOSIS — J449 Chronic obstructive pulmonary disease, unspecified: Secondary | ICD-10-CM | POA: Diagnosis not present

## 2021-08-31 ENCOUNTER — Ambulatory Visit (INDEPENDENT_AMBULATORY_CARE_PROVIDER_SITE_OTHER): Payer: Medicare Other | Admitting: Physician Assistant

## 2021-08-31 ENCOUNTER — Encounter: Payer: Self-pay | Admitting: Physician Assistant

## 2021-08-31 ENCOUNTER — Other Ambulatory Visit: Payer: Self-pay

## 2021-08-31 VITALS — BP 130/80 | HR 77 | Temp 97.8°F | Resp 16 | Ht 67.0 in | Wt 181.0 lb

## 2021-08-31 DIAGNOSIS — R300 Dysuria: Secondary | ICD-10-CM | POA: Diagnosis not present

## 2021-08-31 DIAGNOSIS — R5383 Other fatigue: Secondary | ICD-10-CM

## 2021-08-31 DIAGNOSIS — Z0001 Encounter for general adult medical examination with abnormal findings: Secondary | ICD-10-CM

## 2021-08-31 DIAGNOSIS — Z8669 Personal history of other diseases of the nervous system and sense organs: Secondary | ICD-10-CM | POA: Diagnosis not present

## 2021-08-31 DIAGNOSIS — R7989 Other specified abnormal findings of blood chemistry: Secondary | ICD-10-CM | POA: Diagnosis not present

## 2021-08-31 DIAGNOSIS — Z125 Encounter for screening for malignant neoplasm of prostate: Secondary | ICD-10-CM

## 2021-08-31 DIAGNOSIS — R569 Unspecified convulsions: Secondary | ICD-10-CM

## 2021-08-31 DIAGNOSIS — Z5181 Encounter for therapeutic drug level monitoring: Secondary | ICD-10-CM | POA: Diagnosis not present

## 2021-08-31 DIAGNOSIS — I10 Essential (primary) hypertension: Secondary | ICD-10-CM

## 2021-08-31 DIAGNOSIS — J4489 Other specified chronic obstructive pulmonary disease: Secondary | ICD-10-CM

## 2021-08-31 DIAGNOSIS — E782 Mixed hyperlipidemia: Secondary | ICD-10-CM | POA: Diagnosis not present

## 2021-08-31 DIAGNOSIS — J449 Chronic obstructive pulmonary disease, unspecified: Secondary | ICD-10-CM

## 2021-08-31 NOTE — Progress Notes (Signed)
[facility] [place], [place] [address]  Internal MEDICINE  Office Visit Note  Patient Name: Derek Webb  [id number]  [id number]  Date of Service: 09/01/2021  Chief Complaint  Patient presents with   Medicare Wellness   Hypertension   Hyperlipidemia     HPI Pt is here for routine health maintenance examination and has no complaints today -sleeping ok and eating well -BP well controlled -he is followed by pulmonology and breathing has been good -he continues to take dilantin 400mg  before bed, has not had any recent seizures and has been on this dose for prevention for a long time. Will recheck levels -He is due for routine fasting labs and these will be ordered today -He will be due for colonoscopy in August of this year  Current Medication: Outpatient Encounter Medications as of 08/31/2021  Medication Sig   aspirin 81 MG tablet Take 81 mg by mouth daily.   atorvastatin (LIPITOR) 80 MG tablet Take 1 tablet (80 mg total) by mouth daily.   budesonide-formoterol (SYMBICORT) 160-4.5 MCG/ACT inhaler Inhale 1 puff into the lungs 2 (two) times daily.   Cholecalciferol (VITAMIN D) 2000 UNITS CAPS Take by mouth. VITAMIN D 3 2000 IU  ONE SOFTGEL DAILY  --OTC   clopidogrel (PLAVIX) 75 MG tablet Take 1 tablet (75 mg total) by mouth daily.   enalapril (VASOTEC) 20 MG tablet Take 1 tablet (20 mg total) by mouth daily.   ezetimibe (ZETIA) 10 MG tablet Take 1 tablet (10 mg total) by mouth daily.   OXYGEN Inhale into the lungs. At night   pantoprazole (PROTONIX) 40 MG tablet Take 1 tablet (40 mg total) by mouth daily. IN AM   phenytoin (DILANTIN) 100 MG ER capsule Take 4 capsules (400 mg total) by mouth at bedtime.   No facility-administered encounter medications on file as of 08/31/2021.    Surgical History: Past Surgical History:  Procedure Laterality Date   CAROTID SURGERY 2009 -LEFT     COLON POLYP EXCISION     COLON SURGERY     COLONOSCOPY WITH PROPOFOL  N/A 04/18/2017   Procedure: COLONOSCOPY WITH PROPOFOL;  Surgeon: Manya Silvas, MD;  Location: [facility] ENDOSCOPY;  Service: Endoscopy;  Laterality: N/A;   ESOPHAGOGASTRODUODENOSCOPY     IR GENERIC HISTORICAL  01/21/2016   IR RADIOLOGIST EVAL & MGMT 01/21/2016 Aletta Edouard, MD [facility] INTERV RAD   IR GENERIC HISTORICAL  10/22/2014   IR RADIOLOGIST EVAL & MGMT 10/22/2014 Aletta Edouard, MD [facility] INTERV RAD   IR RADIOLOGIST EVAL & MGMT  02/02/2017   IR RADIOLOGIST EVAL & MGMT  03/08/2018   percutaneous biopsy      Medical History: Past Medical History:  Diagnosis Date   Alcoholism ([place])    COPD (chronic obstructive pulmonary disease) (HCC)    SEVERE COPD -OCCASIONALLY USES OXYGEN AT NIGHT-DOES NOT USE INHALERS ON REGULAR BASIS   Elevated cholesterol    GERD (gastroesophageal reflux disease)    HOH (hard of hearing)    Hypertension    Lung cancer (HCC)    LEFT UPPER LUNG CARCINOMA-NOT A CANDIDATE FOR SURGICAL RESECTION BECAUSE OF HIS COPD AND POOR CANDIDATE FOR RADIATION BECAUSE OF TRANSPORTATION PROBLEMS   Seizures ([place])    CHRONIC DILATIN - PT STATES NO SEIZURES IN PAST COUPLE OF YEARS; PT STATES HIS SEIZURES CAUSED NUMBNESS OF ARMS AND HANDS AND NOT ABLE TO MOVE HIS ARMS   Stroke ([place])    2 TO 3 YRS AGO-AFFECTED HIS SPEECH--ABLE TO AMBULATE  WITHOUT ASSIST AND DOES YARD WORK.  DECREASED HEARING IN BOTH EARS SINCE STROKE.   Vitamin D deficiency     Family History: Family History  Problem Relation Age of Onset   Colon polyps Sister       Review of Systems  Constitutional:  Negative for chills, fatigue and unexpected weight change.  HENT:  Negative for congestion, postnasal drip, rhinorrhea, sneezing and sore throat.   Eyes:  Negative for redness.  Respiratory:  Negative for cough, chest tightness and shortness of breath.   Cardiovascular:  Negative for chest pain and palpitations.  Gastrointestinal:  Negative for abdominal pain, constipation, diarrhea, nausea and vomiting.   Genitourinary:  Negative for dysuria and frequency.  Musculoskeletal:  Negative for arthralgias, back pain, joint swelling and neck pain.  Skin:  Negative for rash.  Neurological:  Negative for tremors and numbness.  Hematological:  Negative for adenopathy. Does not bruise/bleed easily.  Psychiatric/Behavioral:  Negative for behavioral problems (Depression), sleep disturbance and suicidal ideas. The patient is not nervous/anxious.     Vital Signs: BP 130/80    Pulse 77    Temp 97.8 F (36.6 C)    Resp 16    Ht 5\' 7"  (1.702 m)    Wt 181 lb (82.1 kg)    SpO2 97%    BMI 28.35 kg/m    Physical Exam Vitals and nursing note reviewed.  Constitutional:      General: He is not in acute distress.    Appearance: He is well-developed. He is not diaphoretic.  HENT:     Head: Normocephalic and atraumatic.     Right Ear: External ear normal.     Left Ear: External ear normal.     Nose: Nose normal.     Mouth/Throat:     Pharynx: No oropharyngeal exudate.  Eyes:     General: No scleral icterus.       Right eye: No discharge.        Left eye: No discharge.     Conjunctiva/sclera: Conjunctivae normal.     Pupils: Pupils are equal, round, and reactive to light.  Neck:     Thyroid: No thyromegaly.     Vascular: No JVD.     Trachea: No tracheal deviation.  Cardiovascular:     Rate and Rhythm: Normal rate and regular rhythm.     Heart sounds: Normal heart sounds. No murmur heard.   No friction rub. No gallop.  Pulmonary:     Effort: Pulmonary effort is normal. No respiratory distress.     Breath sounds: Normal breath sounds. No stridor. No wheezing or rales.  Chest:     Chest wall: No tenderness.  Abdominal:     General: Bowel sounds are normal. There is no distension.     Palpations: Abdomen is soft. There is no mass.     Tenderness: There is no abdominal tenderness. There is no guarding or rebound.  Musculoskeletal:        General: No tenderness or deformity. Normal range of motion.      Cervical back: Normal range of motion and neck supple.  Lymphadenopathy:     Cervical: No cervical adenopathy.  Skin:    General: Skin is warm and dry.     Coloration: Skin is not pale.     Findings: No erythema or rash.  Neurological:     Mental Status: He is alert.     Cranial Nerves: No cranial nerve deficit.     Motor: No  abnormal muscle tone.     Coordination: Coordination normal.     Deep Tendon Reflexes: Reflexes are normal and symmetric.  Psychiatric:        Behavior: Behavior normal.        Thought Content: Thought content normal.        Judgment: Judgment normal.     LABS: Recent Results (from the past 2160 hour(s))  UA/M w/rflx Culture, Routine     Status: None   Collection Time: 08/31/21  2:54 PM   Specimen: Urine   Urine  Result Value Ref Range   Specific Gravity, UA 1.023 1.005 - 1.030   pH, UA 5.0 5.0 - 7.5   Color, UA Yellow Yellow   Appearance Ur Clear Clear   Leukocytes,UA Negative Negative   Protein,UA Negative Negative/Trace   Glucose, UA Negative Negative   Ketones, UA Negative Negative   RBC, UA Negative Negative   Bilirubin, UA Negative Negative   Urobilinogen, Ur 0.2 0.2 - 1.0 mg/dL   Nitrite, UA Negative Negative   Microscopic Examination Comment     Comment: Microscopic follows if indicated.   Microscopic Examination See below:     Comment: Microscopic was indicated and was performed.   Urinalysis Reflex Comment     Comment: This specimen will not reflex to a Urine Culture.  Microscopic Examination     Status: None   Collection Time: 08/31/21  2:54 PM   Urine  Result Value Ref Range   WBC, UA None seen 0 - 5 /hpf   RBC None seen 0 - 2 /hpf   Epithelial Cells (non renal) None seen 0 - 10 /hpf   Casts None seen None seen /lpf   Bacteria, UA None seen None seen/Few        Assessment/Plan: 1. Encounter for general adult medical examination with abnormal findings Cpe performed, routine fasting labs ordered, due for repeat  colonoscopy in August 2023  2. Essential hypertension Well controlled, continue current medications  3. Obstructive chronic bronchitis without exacerbation ([place]) Followed by pulmonology  4. History of seizure disorder Will recheck phenytoin level - Dilantin (Phenytoin) level, total  5. Encounter for therapeutic drug level monitoring - Dilantin (Phenytoin) level, total  6. Screening for prostate cancer - PSA Total (Reflex To Free)  7. Mixed hyperlipidemia Continue atorvastatin and Zetia, will recheck labs - Lipid Panel With LDL/HDL Ratio  8. Other fatigue - CBC w/Diff/Platelet - Comprehensive metabolic panel  9. Abnormal thyroid blood test - TSH + free T4  10. Dysuria - UA/M w/rflx Culture, Routine - Microscopic Examination   General Counseling: leevon upperman understanding of the findings of todays visit and agrees with plan of treatment. I have discussed any further diagnostic evaluation that may be needed or ordered today. We also reviewed his medications today. he has been encouraged to call the office with any questions or concerns that should arise related to todays visit.    Counseling:    Orders Placed This Encounter  Procedures   Microscopic Examination   UA/M w/rflx Culture, Routine   CBC w/Diff/Platelet   Comprehensive metabolic panel   TSH + free T4   Lipid Panel With LDL/HDL Ratio   PSA Total (Reflex To Free)   Dilantin (Phenytoin) level, total    No orders of the defined types were placed in this encounter.   This patient was seen by Drema Dallas, PA-C in collaboration with Dr. Clayborn Bigness as a part of collaborative care agreement.  Total time spent:35 Minutes  Time spent includes review of chart, medications, test results, and follow up plan with the patient.     Lavera Guise, MD  Internal Medicine

## 2021-09-01 LAB — UA/M W/RFLX CULTURE, ROUTINE
Bilirubin, UA: NEGATIVE
Glucose, UA: NEGATIVE
Ketones, UA: NEGATIVE
Leukocytes,UA: NEGATIVE
Nitrite, UA: NEGATIVE
Protein,UA: NEGATIVE
RBC, UA: NEGATIVE
Specific Gravity, UA: 1.023 (ref 1.005–1.030)
Urobilinogen, Ur: 0.2 mg/dL (ref 0.2–1.0)
pH, UA: 5 (ref 5.0–7.5)

## 2021-09-01 LAB — MICROSCOPIC EXAMINATION
Bacteria, UA: NONE SEEN
Casts: NONE SEEN /lpf
Epithelial Cells (non renal): NONE SEEN /hpf (ref 0–10)
RBC, Urine: NONE SEEN /hpf (ref 0–2)
WBC, UA: NONE SEEN /hpf (ref 0–5)

## 2021-09-18 ENCOUNTER — Other Ambulatory Visit: Payer: Self-pay | Admitting: Internal Medicine

## 2021-09-18 DIAGNOSIS — K219 Gastro-esophageal reflux disease without esophagitis: Secondary | ICD-10-CM

## 2021-09-21 DIAGNOSIS — H25013 Cortical age-related cataract, bilateral: Secondary | ICD-10-CM | POA: Diagnosis not present

## 2021-09-21 DIAGNOSIS — H2513 Age-related nuclear cataract, bilateral: Secondary | ICD-10-CM | POA: Diagnosis not present

## 2021-09-21 DIAGNOSIS — H35033 Hypertensive retinopathy, bilateral: Secondary | ICD-10-CM | POA: Diagnosis not present

## 2021-09-21 DIAGNOSIS — H35013 Changes in retinal vascular appearance, bilateral: Secondary | ICD-10-CM | POA: Diagnosis not present

## 2021-09-23 DIAGNOSIS — J449 Chronic obstructive pulmonary disease, unspecified: Secondary | ICD-10-CM | POA: Diagnosis not present

## 2021-10-21 DIAGNOSIS — J449 Chronic obstructive pulmonary disease, unspecified: Secondary | ICD-10-CM | POA: Diagnosis not present

## 2021-11-19 IMAGING — CT CT CHEST W/O CM
2 of 4 series · 15 of 36 positions shown, 18 images · non-contrast
Comparison: CT chest dated 03/02/2018

CLINICAL DATA: Shortness of breath. Patient has a history of left
upper lobe lung cancer status post thermal ablation in 0149.

EXAM:
CT CHEST WITHOUT CONTRAST
TECHNIQUE: Multidetector CT imaging of the chest was performed following the
standard protocol without IV contrast.

[Series 2: chest 2.00 · axial · 0.81mm/px · z∈[-1194,-904]mm · 12 of 173 slices shown, 15 images]
[im 14/173  mediastinal]
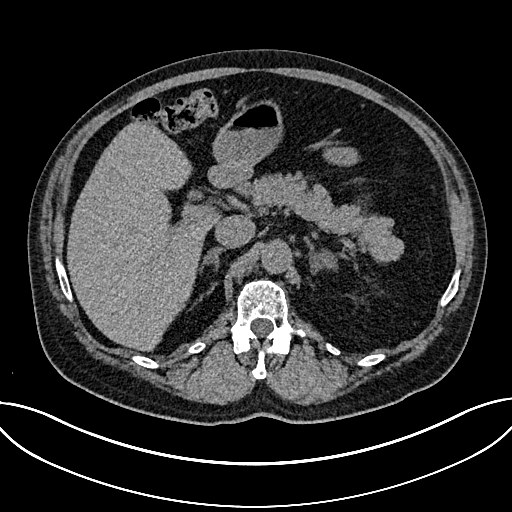
[im 14/173  lung]
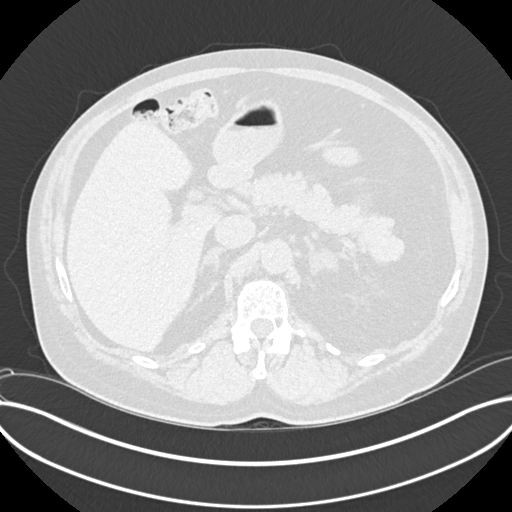
[im 27/173  lung]
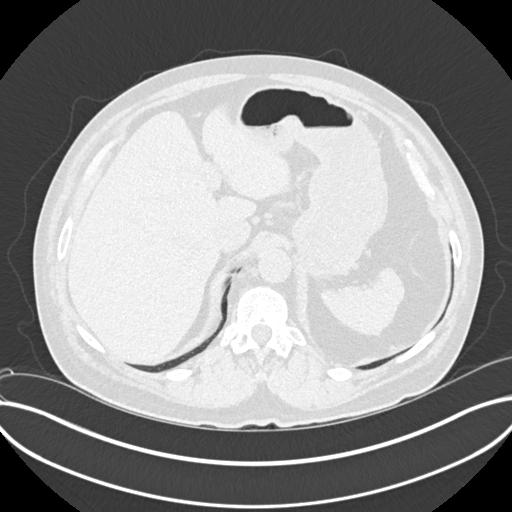
[im 40/173  lung]
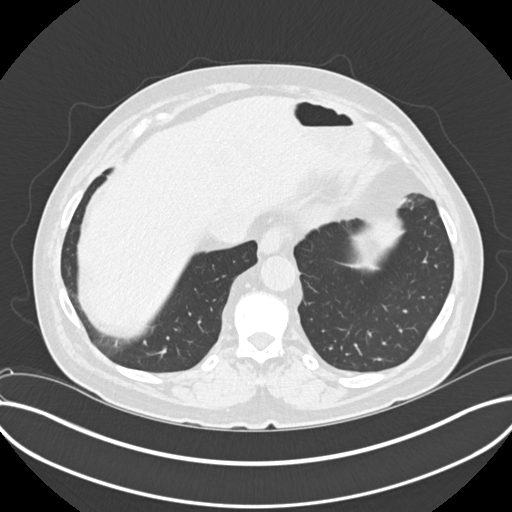
[im 53/173  lung]
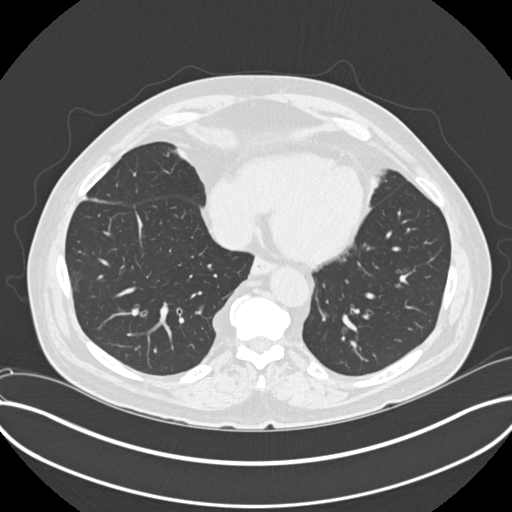
[im 67/173  mediastinal]
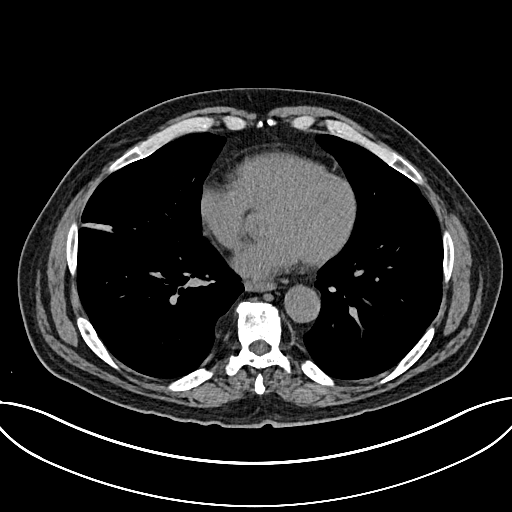
[im 67/173  lung]
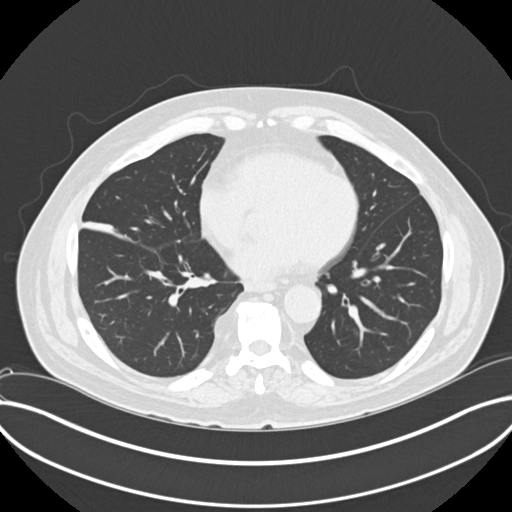
[im 80/173  lung]
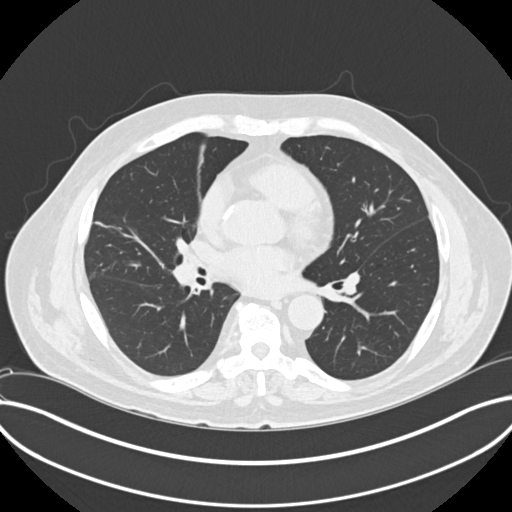
[im 93/173  lung]
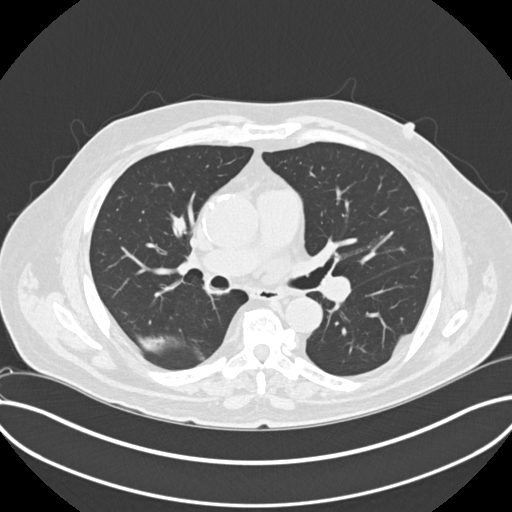
[im 106/173  lung]
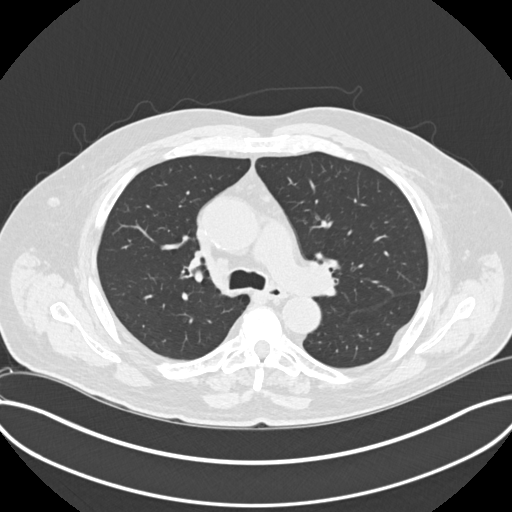
[im 120/173  mediastinal]
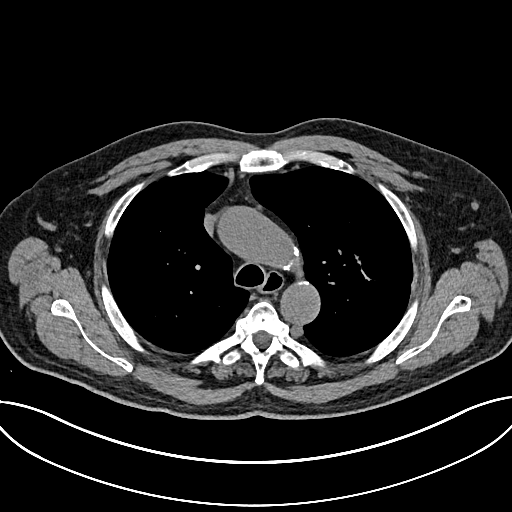
[im 120/173  lung]
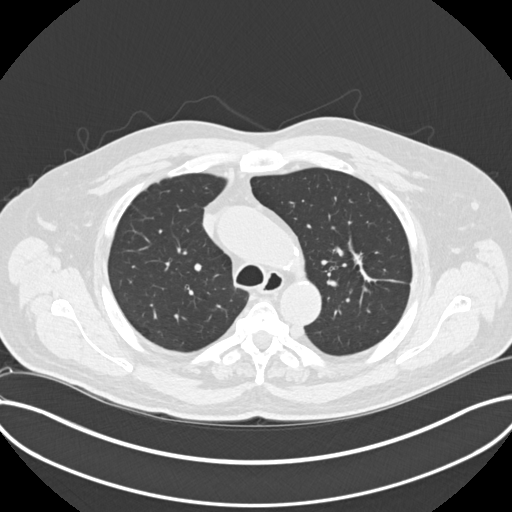
[im 133/173  lung]
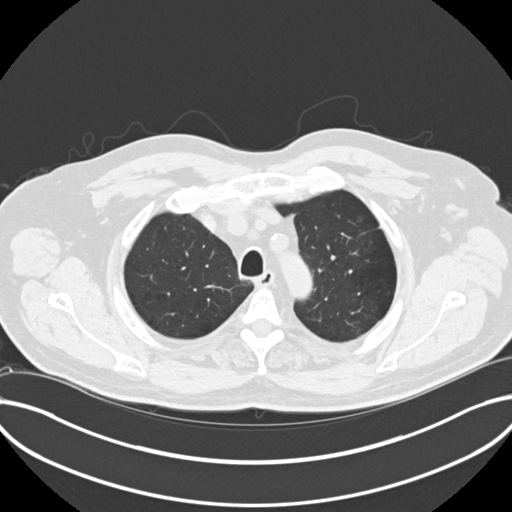
[im 146/173  lung]
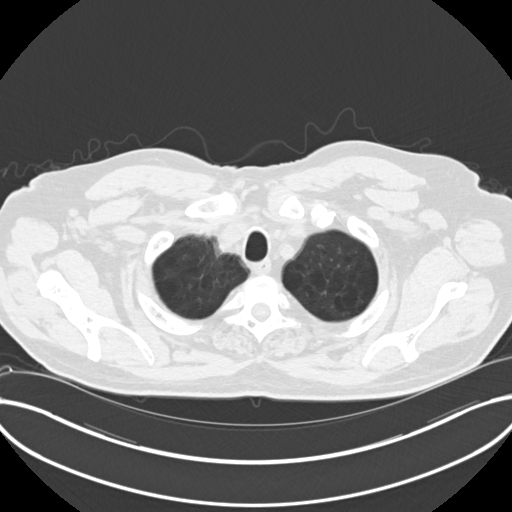
[im 159/173  lung]
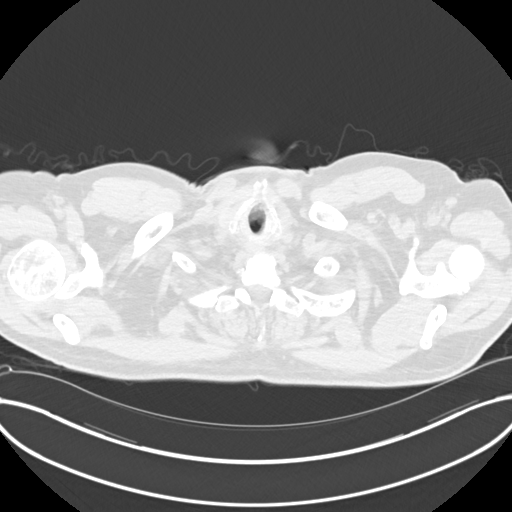

[Series 5: coronals chest 2.00 cor · coronal · 0.68mm/px · 3 of 182 slices shown]
[im 37/182  lung]
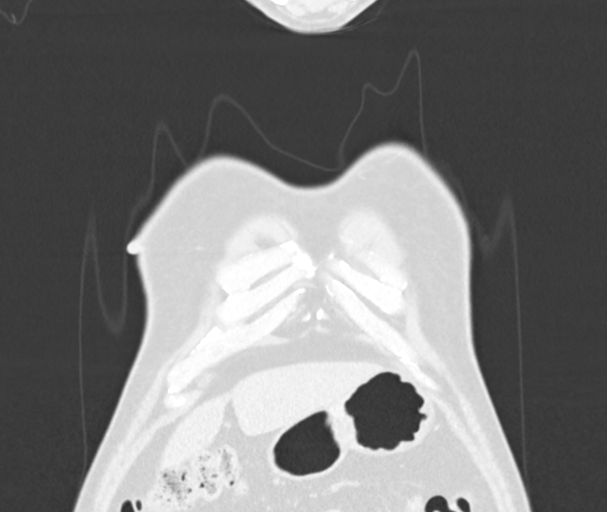
[im 73/182  lung]
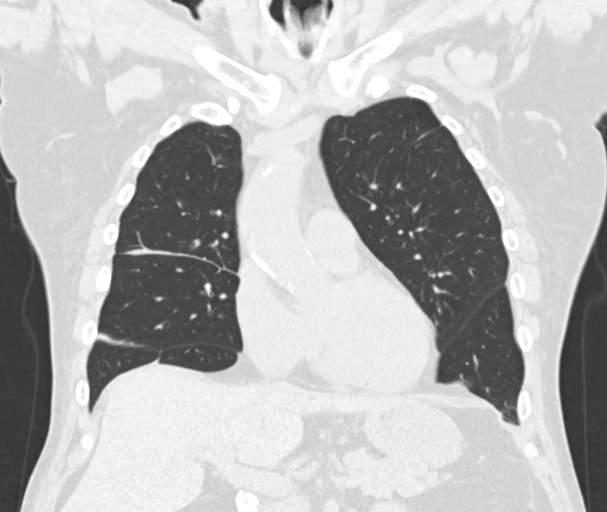
[im 109/182  lung]
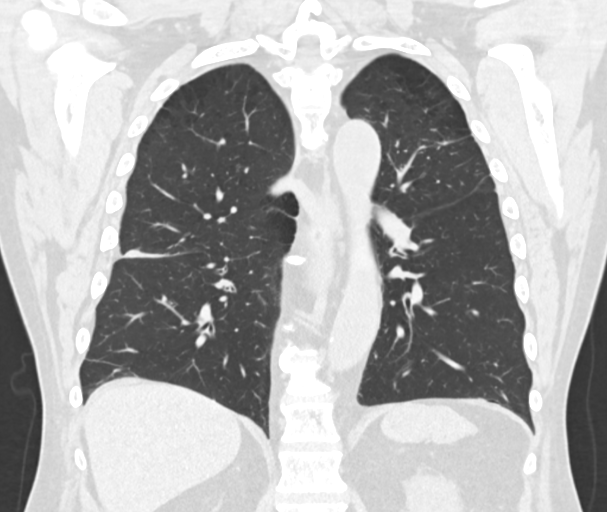

[15 of 36 positions shown; findings below may reference images not displayed]

FINDINGS: Cardiovascular: Vascular calcifications are seen in the aortic arch.
Normal heart size. No pericardial effusion.

Mediastinum/Nodes: No enlarged mediastinal or axillary lymph nodes.
Aspirated debris is seen in the trachea. The thyroid and esophagus
demonstrate no significant findings.

Lungs/Pleura: Spiculated mass/scar in the left upper lobe measures
3.3 x 1.2 cm (series 3, image 52) and does not appear significantly
changed since 03/02/2018. Atelectasis/scarring of the right middle
and upper lobes along the major and minor fissures appears
increased. Upper lobe predominant centrilobular emphysema is noted.

Upper Abdomen: A left adrenal nodule is not changed since at least
0149 and benign.

Musculoskeletal: Degenerative changes are seen in the spine.
IMPRESSION: 1. Post ablation scarring in the left upper lobe is not
significantly changed since 03/02/2018.
[DATE]. Atelectasis/scarring of the right middle and upper lobes along
the major and minor fissures appears increased.

Aortic Atherosclerosis ([id number]-OT2.2).

## 2021-11-21 DIAGNOSIS — J449 Chronic obstructive pulmonary disease, unspecified: Secondary | ICD-10-CM | POA: Diagnosis not present

## 2021-11-24 ENCOUNTER — Ambulatory Visit: Payer: Medicare Other | Admitting: Internal Medicine

## 2021-11-25 ENCOUNTER — Telehealth: Payer: Medicare Other

## 2021-12-04 ENCOUNTER — Telehealth: Payer: Medicare Other

## 2021-12-07 DIAGNOSIS — R5383 Other fatigue: Secondary | ICD-10-CM | POA: Diagnosis not present

## 2021-12-07 DIAGNOSIS — E611 Iron deficiency: Secondary | ICD-10-CM | POA: Diagnosis not present

## 2021-12-07 DIAGNOSIS — D529 Folate deficiency anemia, unspecified: Secondary | ICD-10-CM | POA: Diagnosis not present

## 2021-12-07 DIAGNOSIS — R790 Abnormal level of blood mineral: Secondary | ICD-10-CM | POA: Diagnosis not present

## 2021-12-07 DIAGNOSIS — R7989 Other specified abnormal findings of blood chemistry: Secondary | ICD-10-CM | POA: Diagnosis not present

## 2021-12-07 DIAGNOSIS — E538 Deficiency of other specified B group vitamins: Secondary | ICD-10-CM | POA: Diagnosis not present

## 2021-12-07 DIAGNOSIS — R569 Unspecified convulsions: Secondary | ICD-10-CM | POA: Diagnosis not present

## 2021-12-07 DIAGNOSIS — E782 Mixed hyperlipidemia: Secondary | ICD-10-CM | POA: Diagnosis not present

## 2021-12-08 ENCOUNTER — Ambulatory Visit (INDEPENDENT_AMBULATORY_CARE_PROVIDER_SITE_OTHER): Payer: Medicare Other | Admitting: Internal Medicine

## 2021-12-08 ENCOUNTER — Telehealth: Payer: Self-pay

## 2021-12-08 ENCOUNTER — Encounter: Payer: Self-pay | Admitting: Internal Medicine

## 2021-12-08 VITALS — BP 110/66 | HR 78 | Temp 98.2°F | Resp 16 | Ht 67.0 in | Wt 181.0 lb

## 2021-12-08 DIAGNOSIS — R911 Solitary pulmonary nodule: Secondary | ICD-10-CM

## 2021-12-08 DIAGNOSIS — J449 Chronic obstructive pulmonary disease, unspecified: Secondary | ICD-10-CM

## 2021-12-08 DIAGNOSIS — R0602 Shortness of breath: Secondary | ICD-10-CM | POA: Diagnosis not present

## 2021-12-08 DIAGNOSIS — J4489 Other specified chronic obstructive pulmonary disease: Secondary | ICD-10-CM

## 2021-12-08 LAB — COMPREHENSIVE METABOLIC PANEL
ALT: 14 IU/L (ref 0–44)
AST: 17 IU/L (ref 0–40)
Albumin/Globulin Ratio: 1.5 (ref 1.2–2.2)
Albumin: 4.6 g/dL (ref 3.8–4.8)
Alkaline Phosphatase: 120 IU/L (ref 44–121)
BUN/Creatinine Ratio: 12 (ref 10–24)
BUN: 8 mg/dL (ref 8–27)
Bilirubin Total: <0.2 mg/dL (ref 0.0–1.2)
CO2: 26 mmol/L (ref 20–29)
Calcium: 10 mg/dL (ref 8.6–10.2)
Chloride: 103 mmol/L (ref 96–106)
Creatinine, Ser: 0.65 mg/dL — ABNORMAL LOW (ref 0.76–1.27)
Globulin, Total: 3 g/dL (ref 1.5–4.5)
Glucose: 94 mg/dL (ref 70–99)
Potassium: 4.7 mmol/L (ref 3.5–5.2)
Sodium: 143 mmol/L (ref 134–144)
Total Protein: 7.6 g/dL (ref 6.0–8.5)
eGFR: 103 mL/min/{1.73_m2} (ref >59)

## 2021-12-08 LAB — LIPID PANEL WITH LDL/HDL RATIO
Cholesterol, Total: 183 mg/dL (ref 100–199)
HDL: 75 mg/dL (ref >39)
LDL Chol Calc (NIH): 97 mg/dL (ref 0–99)
LDL/HDL Ratio: 1.3 ratio (ref 0.0–3.6)
Triglycerides: 60 mg/dL (ref 0–149)
VLDL Cholesterol Cal: 11 mg/dL (ref 5–40)

## 2021-12-08 LAB — CBC WITH DIFFERENTIAL/PLATELET
Basophils Absolute: 0.1 10*3/uL (ref 0.0–0.2)
Basos: 2 %
EOS (ABSOLUTE): 0.1 10*3/uL (ref 0.0–0.4)
Eos: 3 %
Hematocrit: 33.8 % — ABNORMAL LOW (ref 37.5–51.0)
Hemoglobin: 10.6 g/dL — ABNORMAL LOW (ref 13.0–17.7)
Immature Grans (Abs): 0 10*3/uL (ref 0.0–0.1)
Immature Granulocytes: 0 %
Lymphocytes Absolute: 2 10*3/uL (ref 0.7–3.1)
Lymphs: 43 %
MCH: 24.3 pg — ABNORMAL LOW (ref 26.6–33.0)
MCHC: 31.4 g/dL — ABNORMAL LOW (ref 31.5–35.7)
MCV: 78 fL — ABNORMAL LOW (ref 79–97)
Monocytes Absolute: 0.6 10*3/uL (ref 0.1–0.9)
Monocytes: 14 %
Neutrophils Absolute: 1.7 10*3/uL (ref 1.4–7.0)
Neutrophils: 38 %
Platelets: 517 10*3/uL — ABNORMAL HIGH (ref 150–450)
RBC: 4.36 x10E6/uL (ref 4.14–5.80)
RDW: 15.8 % — ABNORMAL HIGH (ref 11.6–15.4)
WBC: 4.6 10*3/uL (ref 3.4–10.8)

## 2021-12-08 LAB — TSH+FREE T4
Free T4: 1.11 ng/dL (ref 0.82–1.77)
TSH: 1.51 u[IU]/mL (ref 0.450–4.500)

## 2021-12-08 LAB — PSA TOTAL (REFLEX TO FREE): Prostate Specific Ag, Serum: 3 ng/mL (ref 0.0–4.0)

## 2021-12-08 LAB — PHENYTOIN LEVEL, TOTAL: Phenytoin (Dilantin), Serum: 11.8 ug/mL (ref 10.0–20.0)

## 2021-12-08 NOTE — Telephone Encounter (Signed)
Called [facility] to add on B12, Folate, Iron and Ferritin tests for patient. ?

## 2021-12-08 NOTE — Telephone Encounter (Signed)
-----   Message from Mylinda Latina, PA-C sent at 12/08/2021  3:45 PM EDT ----- ?Please see if lab can add iron, Ferritin, B12, and folate  ?

## 2021-12-08 NOTE — Progress Notes (Signed)
[facility] ?[address] ?[place], [place] [address] ? ?Pulmonary Sleep Medicine  ? ?Office Visit Note ? ?Patient Name: Derek Webb ?DOB: 05-May-1951 ?MRN [id number] ? ?Date of Service: 12/08/2021 ? ?Complaints/HPI: COPD Pulmonary Nodules. States he is doing well overall. States sometimes hehas sputum. He does not feel SOB at this time. Patient states he has no hemoptysis noted. Denies having chest pain. Last CT was in 2021 for his spiculated nodule. Patient states he has not followed up for this. Patient has noted no major weight loss. Denies smoking at this time. Arlyce Harman dpone shows FEV1 is 34%. Currently he is using symbicort which does help he states. He has no recent admissions to the hospital ? ?ROS ? ?General: (-) fever, (-) chills, (-) night sweats, (-) weakness ?Skin: (-) rashes, (-) itching,. ?Eyes: (-) visual changes, (-) redness, (-) itching. ?Nose and Sinuses: (-) nasal stuffiness or itchiness, (-) postnasal drip, (-) nosebleeds, (-) sinus trouble. ?Mouth and Throat: (-) sore throat, (-) hoarseness. ?Neck: (-) swollen glands, (-) enlarged thyroid, (-) neck pain. ?Respiratory: - cough, (-) bloody sputum, - shortness of breath, - wheezing. ?Cardiovascular: - ankle swelling, (-) chest pain. ?Lymphatic: (-) lymph node enlargement. ?Neurologic: (-) numbness, (-) tingling. ?Psychiatric: (-) anxiety, (-) depression ? ? ?Current Medication: ?Outpatient Encounter Medications as of 12/08/2021  ?Medication Sig  ? aspirin 81 MG tablet Take 81 mg by mouth daily.  ? atorvastatin (LIPITOR) 80 MG tablet Take 1 tablet (80 mg total) by mouth daily.  ? budesonide-formoterol (SYMBICORT) 160-4.5 MCG/ACT inhaler Inhale 1 puff into the lungs 2 (two) times daily.  ? Cholecalciferol (VITAMIN D) 2000 UNITS CAPS Take by mouth. VITAMIN D 3 2000 IU  ONE SOFTGEL DAILY  --OTC  ? clopidogrel (PLAVIX) 75 MG tablet Take 1 tablet (75 mg total) by mouth daily.  ? enalapril (VASOTEC) 20 MG tablet Take 1 tablet (20 mg total) by  mouth daily.  ? ezetimibe (ZETIA) 10 MG tablet Take 1 tablet (10 mg total) by mouth daily.  ? OXYGEN Inhale into the lungs. At night  ? pantoprazole (PROTONIX) 40 MG tablet Take 1 tablet (40 mg total) by mouth daily. IN AM  ? phenytoin (DILANTIN) 100 MG ER capsule Take 4 capsules (400 mg total) by mouth at bedtime.  ? ?No facility-administered encounter medications on file as of 12/08/2021.  ? ? ?Surgical History: ?Past Surgical History:  ?Procedure Laterality Date  ? CAROTID SURGERY 2009 -LEFT    ? COLON POLYP EXCISION    ? COLON SURGERY    ? COLONOSCOPY WITH PROPOFOL N/A 04/18/2017  ? Procedure: COLONOSCOPY WITH PROPOFOL;  Surgeon: Manya Silvas, MD;  Location: [facility] ENDOSCOPY;  Service: Endoscopy;  Laterality: N/A;  ? ESOPHAGOGASTRODUODENOSCOPY    ? IR GENERIC HISTORICAL  01/21/2016  ? IR RADIOLOGIST EVAL & MGMT 01/21/2016 Aletta Edouard, MD [facility] INTERV RAD  ? IR GENERIC HISTORICAL  10/22/2014  ? IR RADIOLOGIST EVAL & MGMT 10/22/2014 Aletta Edouard, MD [facility] INTERV RAD  ? IR RADIOLOGIST EVAL & MGMT  02/02/2017  ? IR RADIOLOGIST EVAL & MGMT  03/08/2018  ? percutaneous biopsy    ? ? ?Medical History: ?Past Medical History:  ?Diagnosis Date  ? Alcoholism ([place])   ? COPD (chronic obstructive pulmonary disease) ([place])   ? SEVERE COPD -OCCASIONALLY USES OXYGEN AT NIGHT-DOES NOT USE INHALERS ON REGULAR BASIS  ? Elevated cholesterol   ? GERD (gastroesophageal reflux disease)   ? HOH (hard of hearing)   ? Hypertension   ? Lung  cancer [facility])   ? LEFT UPPER LUNG CARCINOMA-NOT A CANDIDATE FOR SURGICAL RESECTION BECAUSE OF HIS COPD AND POOR CANDIDATE FOR RADIATION BECAUSE OF TRANSPORTATION PROBLEMS  ? Seizures ([place])   ? CHRONIC DILATIN - PT STATES NO SEIZURES IN PAST COUPLE OF YEARS; PT STATES HIS SEIZURES CAUSED NUMBNESS OF ARMS AND HANDS AND NOT ABLE TO MOVE HIS ARMS  ? Stroke [facility])   ? 2 TO 3 YRS AGO-AFFECTED HIS SPEECH--ABLE TO AMBULATE WITHOUT ASSIST AND DOES YARD WORK.  DECREASED HEARING IN BOTH EARS SINCE STROKE.  ? Vitamin D  deficiency   ? ? ?Family History: ?Family History  ?Problem Relation Age of Onset  ? Colon polyps Sister   ? ? ?Social History: ?Social History  ? ?Socioeconomic History  ? Marital status: Single  ?  Spouse name: Not on file  ? Number of children: Not on file  ? Years of education: Not on file  ? Highest education level: Not on file  ?Occupational History  ? Not on file  ?Tobacco Use  ? Smoking status: Former  ?  Packs/day: 1.00  ?  Years: 35.00  ?  Pack years: 35.00  ?  Types: Cigarettes  ?  Quit date: 12/22/2011  ?  Years since quitting: 9.9  ? Smokeless tobacco: Never  ?Vaping Use  ? Vaping Use: Never used  ?Substance and Sexual Activity  ? Alcohol use: Yes  ?  Comment: occosionally   ? Drug use: No  ? Sexual activity: Not on file  ?Other Topics Concern  ? Not on file  ?Social History Narrative  ? Not on file  ? ?Social Determinants of Health  ? ?Financial Resource Strain: Not on file  ?Food Insecurity: Not on file  ?Transportation Needs: Not on file  ?Physical Activity: Not on file  ?Stress: Not on file  ?Social Connections: Not on file  ?Intimate Partner Violence: Not on file  ? ? ?Vital Signs: ?Blood pressure 110/66, pulse 78, temperature 98.2 ?F (36.8 ?C), resp. rate 16, height $RemoveBe'5\' 7"'[id number]$  (1.702 m), weight 181 lb (82.1 kg), SpO2 98 %. ? ?Examination: ?General Appearance: The patient is well-developed, well-nourished, and in no distress. ?Skin: Gross inspection of skin unremarkable. ?Head: normocephalic, no gross deformities. ?Eyes: no gross deformities noted. ?ENT: ears appear grossly normal no exudates. ?Neck: Supple. No thyromegaly. No LAD. ?Respiratory: no rhonchi noted at this time. ?Cardiovascular: Normal S1 and S2 without murmur or rub. ?Extremities: No cyanosis. pulses are equal. ?Neurologic: Alert and oriented. No involuntary movements. ? ?LABS: ?Recent Results (from the past 2160 hour(s))  ?CBC w/Diff/Platelet     Status: Abnormal  ? Collection Time: 12/07/21  8:14 AM  ?Result Value Ref Range  ? WBC 4.6 3.4  - 10.8 x10E3/uL  ? RBC 4.36 4.14 - 5.80 x10E6/uL  ? Hemoglobin 10.6 (L) 13.0 - 17.7 g/dL  ? Hematocrit 33.8 (L) 37.5 - 51.0 %  ? MCV 78 (L) 79 - 97 fL  ? MCH 24.3 (L) 26.6 - 33.0 pg  ? MCHC 31.4 (L) 31.5 - 35.7 g/dL  ? RDW 15.8 (H) 11.6 - 15.4 %  ? Platelets 517 (H) 150 - 450 x10E3/uL  ? Neutrophils 38 Not Estab. %  ? Lymphs 43 Not Estab. %  ? Monocytes 14 Not Estab. %  ? Eos 3 Not Estab. %  ? Basos 2 Not Estab. %  ? Neutrophils Absolute 1.7 1.4 - 7.0 x10E3/uL  ? Lymphocytes Absolute 2.0 0.7 - 3.1 x10E3/uL  ? Monocytes Absolute 0.6 0.1 -  0.9 x10E3/uL  ? EOS (ABSOLUTE) 0.1 0.0 - 0.4 x10E3/uL  ? Basophils Absolute 0.1 0.0 - 0.2 x10E3/uL  ? Immature Granulocytes 0 Not Estab. %  ? Immature Grans (Abs) 0.0 0.0 - 0.1 x10E3/uL  ?Comprehensive metabolic panel     Status: Abnormal  ? Collection Time: 12/07/21  8:14 AM  ?Result Value Ref Range  ? Glucose 94 70 - 99 mg/dL  ? BUN 8 8 - 27 mg/dL  ? Creatinine, Ser 0.65 (L) 0.76 - 1.27 mg/dL  ? eGFR 103 >59 mL/min/1.73  ? BUN/Creatinine Ratio 12 10 - 24  ? Sodium 143 134 - 144 mmol/L  ? Potassium 4.7 3.5 - 5.2 mmol/L  ? Chloride 103 96 - 106 mmol/L  ? CO2 26 20 - 29 mmol/L  ? Calcium 10.0 8.6 - 10.2 mg/dL  ? Total Protein 7.6 6.0 - 8.5 g/dL  ? Albumin 4.6 3.8 - 4.8 g/dL  ? Globulin, Total 3.0 1.5 - 4.5 g/dL  ? Albumin/Globulin Ratio 1.5 1.2 - 2.2  ? Bilirubin Total <0.2 0.0 - 1.2 mg/dL  ? Alkaline Phosphatase 120 44 - 121 IU/L  ? AST 17 0 - 40 IU/L  ? ALT 14 0 - 44 IU/L  ?TSH + free T4     Status: None  ? Collection Time: 12/07/21  8:14 AM  ?Result Value Ref Range  ? TSH 1.510 0.450 - 4.500 uIU/mL  ? Free T4 1.11 0.82 - 1.77 ng/dL  ?Lipid Panel With LDL/HDL Ratio     Status: None  ? Collection Time: 12/07/21  8:14 AM  ?Result Value Ref Range  ? Cholesterol, Total 183 100 - 199 mg/dL  ? Triglycerides 60 0 - 149 mg/dL  ? HDL 75 >39 mg/dL  ? VLDL Cholesterol Cal 11 5 - 40 mg/dL  ? LDL Chol Calc (NIH) 97 0 - 99 mg/dL  ? LDL/HDL Ratio 1.3 0.0 - 3.6 ratio  ?  Comment:                                      LDL/HDL Ratio ?                                            Men  Women ?                              1/2 Avg.Risk  1.0    1.5 ?                                  Avg.Risk  3.6

## 2021-12-08 NOTE — Telephone Encounter (Signed)
Chest CT ordered. Printed. Gave to Titania-Toni ?

## 2021-12-08 NOTE — Patient Instructions (Signed)
Chronic Obstructive Pulmonary Disease  Chronic obstructive pulmonary disease (COPD) is a long-term (chronic) lung problem. When you have COPD, it is hard for air to get in and out of your lungs. Usually the condition gets worse over time, and your lungs will never return to normal. There are things you can do to keep yourself as healthy as possible. What are the causes? Smoking. This is the most common cause. Certain genes passed from parent to child (inherited). What increases the risk? Being exposed to secondhand smoke from cigarettes, pipes, or cigars. Being exposed to chemicals and other irritants, such as fumes and dust in the work environment. Having chronic lung conditions or infections. What are the signs or symptoms? Shortness of breath, especially during physical activity. A long-term cough with a large amount of thick mucus. Sometimes, the cough may not have any mucus (dry cough). Wheezing. Breathing quickly. Skin that looks gray or blue, especially in the fingers, toes, or lips. Feeling tired (fatigue). Weight loss. Chest tightness. Having infections often. Episodes when breathing symptoms become much worse (exacerbations). At the later stages of this disease, you may have swelling in the ankles, feet, or legs. How is this treated? Taking medicines. Quitting smoking, if you smoke. Rehabilitation. This includes steps to make your body work better. It may involve a team of specialists. Doing exercises. Making changes to your diet. Using oxygen. Lung surgery. Lung transplant. Comfort measures (palliative care). Follow these instructions at home: Medicines Take over-the-counter and prescription medicines only as told by your doctor. Talk to your doctor before taking any cough or allergy medicines. You may need to avoid medicines that cause your lungs to be dry. Lifestyle If you smoke, stop smoking. Smoking makes the problem worse. Do not smoke or use any products that  contain nicotine or tobacco. If you need help quitting, ask your doctor. Avoid being around things that make your breathing worse. This may include smoke, chemicals, and fumes. Stay active, but remember to rest as well. Learn and use tips on how to manage stress and control your breathing. Make sure you get enough sleep. Most adults need at least 7 hours of sleep every night. Eat healthy foods. Eat smaller meals more often. Rest before meals. Controlled breathing Learn and use tips on how to control your breathing as told by your doctor. Try: Breathing in (inhaling) through your nose for 1 second. Then, pucker your lips and breath out (exhale) through your lips for 2 seconds. Putting one hand on your belly (abdomen). Breathe in slowly through your nose for 1 second. Your hand on your belly should move out. Pucker your lips and breathe out slowly through your lips. Your hand on your belly should move in as you breathe out.  Controlled coughing Learn and use controlled coughing to clear mucus from your lungs. Follow these steps: Lean your head a little forward. Breathe in deeply. Try to hold your breath for 3 seconds. Keep your mouth slightly open while coughing 2 times. Spit any mucus out into a tissue. Rest and do the steps again 1 or 2 times as needed. General instructions Make sure you get all the shots (vaccines) that your doctor recommends. Ask your doctor about a flu shot and a pneumonia shot. Use oxygen therapy and pulmonary rehabilitation if told by your doctor. If you need home oxygen therapy, ask your doctor if you should buy a tool to measure your oxygen level (oximeter). Make a COPD action plan with your doctor. This helps you   to know what to do if you feel worse than usual. Manage any other conditions you have as told by your doctor. Avoid going outside when it is very hot, cold, or humid. Avoid people who have a sickness you can catch (contagious). Keep all follow-up  visits. Contact a doctor if: You cough up more mucus than usual. There is a change in the color or thickness of the mucus. It is harder to breathe than usual. Your breathing is faster than usual. You have trouble sleeping. You need to use your medicines more often than usual. You have trouble doing your normal activities such as getting dressed or walking around the house. Get help right away if: You have shortness of breath while resting. You have shortness of breath that stops you from: Being able to talk. Doing normal activities. Your chest hurts for longer than 5 minutes. Your skin color is more blue than usual. Your pulse oximeter shows that you have low oxygen for longer than 5 minutes. You have a fever. You feel too tired to breathe normally. These symptoms may represent a serious problem that is an emergency. Do not wait to see if the symptoms will go away. Get medical help right away. Call your local emergency services (911 in the [place] Do not drive yourself to the hospital. Summary Chronic obstructive pulmonary disease (COPD) is a long-term lung problem. The way your lungs work will never return to normal. Usually the condition gets worse over time. There are things you can do to keep yourself as healthy as possible. Take over-the-counter and prescription medicines only as told by your doctor. If you smoke, stop. Smoking makes the problem worse. This information is not intended to replace advice given to you by your health care provider. Make sure you discuss any questions you have with your health care provider. Document Revised: 06/17/2020 Document Reviewed: 06/17/2020 Elsevier Patient Education  2023 Elsevier Inc.  

## 2021-12-09 ENCOUNTER — Telehealth: Payer: Self-pay

## 2021-12-09 ENCOUNTER — Other Ambulatory Visit: Payer: Self-pay | Admitting: Physician Assistant

## 2021-12-09 DIAGNOSIS — D509 Iron deficiency anemia, unspecified: Secondary | ICD-10-CM

## 2021-12-09 NOTE — Telephone Encounter (Signed)
Spoke with patient's sister regarding lab results. Advised patient to schedule B12 injections weekly, then monthly, per Lauren. ?

## 2021-12-09 NOTE — Telephone Encounter (Signed)
-----   Message from Mylinda Latina, PA-C sent at 12/09/2021  4:04 PM EDT ----- ?Please let pt know that he has iron deficiency anemia and his hemoglobin is low and his iron is very low and needs to start supplementing daily. If he cannot tolerate this then I will refer to hematology for possible iron infusions. Additionally his b12 is low and I would recommend starting b12 injections weekly x3 then monthly. His other labs looked good. Please see how he is feeling--more fatigued or SOB than usual recently? As this can be due to the IDA and should take it easy. Please also ask if he has noticed any blood in stool or dark color to stool as we want to ensure no GI bleed as potential cause for this. Will need to recheck his labs in a few weeks to see if numbers improving and I will go ahead and place orders for these. ?

## 2021-12-09 NOTE — Telephone Encounter (Signed)
Patient scheduled for ct chest on 12/23/21 @ 1:30 kp/tat ?

## 2021-12-10 ENCOUNTER — Telehealth: Payer: Self-pay

## 2021-12-10 LAB — FERRITIN: Ferritin: 11 ng/mL — ABNORMAL LOW (ref 30–400)

## 2021-12-10 LAB — IRON AND TIBC
Iron Saturation: 6 % — CL (ref 15–55)
Iron: 20 ug/dL — ABNORMAL LOW (ref 38–169)
Total Iron Binding Capacity: 310 ug/dL (ref 250–450)
UIBC: 290 ug/dL (ref 111–343)

## 2021-12-10 LAB — SPECIMEN STATUS REPORT

## 2021-12-10 LAB — FOLATE: Folate: 9.5 ng/mL (ref >3.0)

## 2021-12-10 LAB — VITAMIN B12: Vitamin B-12: 256 pg/mL (ref 232–1245)

## 2021-12-10 NOTE — Telephone Encounter (Signed)
Pt's sister called and was asking about the Iron supplement she picked up at store.  It was the 65 mg tablet and I informed her that it should be ok to help his iron and also he needs to eat iron enriched foods to help boost his iron.  I gave her names of a few things and also advised to go on Internet to look up foods he can eat that has iron in them. ?

## 2021-12-15 ENCOUNTER — Ambulatory Visit (INDEPENDENT_AMBULATORY_CARE_PROVIDER_SITE_OTHER): Payer: Medicare Other

## 2021-12-15 DIAGNOSIS — E538 Deficiency of other specified B group vitamins: Secondary | ICD-10-CM | POA: Diagnosis not present

## 2021-12-15 MED ORDER — CYANOCOBALAMIN 1000 MCG/ML IJ SOLN
1000.0000 ug | Freq: Once | INTRAMUSCULAR | Status: AC
  Administered 2021-12-15: 1000 ug via INTRAMUSCULAR

## 2021-12-17 ENCOUNTER — Other Ambulatory Visit: Payer: Self-pay | Admitting: Internal Medicine

## 2021-12-17 DIAGNOSIS — K219 Gastro-esophageal reflux disease without esophagitis: Secondary | ICD-10-CM

## 2021-12-21 DIAGNOSIS — J449 Chronic obstructive pulmonary disease, unspecified: Secondary | ICD-10-CM | POA: Diagnosis not present

## 2021-12-22 ENCOUNTER — Ambulatory Visit: Payer: Medicare Other

## 2021-12-23 ENCOUNTER — Ambulatory Visit (INDEPENDENT_AMBULATORY_CARE_PROVIDER_SITE_OTHER): Payer: Medicare Other

## 2021-12-23 ENCOUNTER — Other Ambulatory Visit: Payer: Self-pay

## 2021-12-23 ENCOUNTER — Ambulatory Visit
Admission: RE | Admit: 2021-12-23 | Discharge: 2021-12-23 | Disposition: A | Discharge status: Home / Self Care | Payer: Medicare Other | Source: Ambulatory Visit | Attending: Internal Medicine | Admitting: Internal Medicine

## 2021-12-23 DIAGNOSIS — R911 Solitary pulmonary nodule: Secondary | ICD-10-CM | POA: Diagnosis not present

## 2021-12-23 DIAGNOSIS — E538 Deficiency of other specified B group vitamins: Secondary | ICD-10-CM | POA: Diagnosis not present

## 2021-12-23 DIAGNOSIS — J9811 Atelectasis: Secondary | ICD-10-CM | POA: Diagnosis not present

## 2021-12-23 MED ORDER — CYANOCOBALAMIN 1000 MCG/ML IJ SOLN
1000.0000 ug | Freq: Once | INTRAMUSCULAR | Status: AC
  Administered 2021-12-23: 1000 ug via INTRAMUSCULAR

## 2021-12-29 ENCOUNTER — Ambulatory Visit (INDEPENDENT_AMBULATORY_CARE_PROVIDER_SITE_OTHER): Payer: Medicare Other

## 2021-12-29 DIAGNOSIS — E538 Deficiency of other specified B group vitamins: Secondary | ICD-10-CM

## 2021-12-29 MED ORDER — CYANOCOBALAMIN 1000 MCG/ML IJ SOLN
1000.0000 ug | Freq: Once | INTRAMUSCULAR | Status: AC
  Administered 2021-12-29: 1000 ug via INTRAMUSCULAR

## 2022-01-21 DIAGNOSIS — J449 Chronic obstructive pulmonary disease, unspecified: Secondary | ICD-10-CM | POA: Diagnosis not present

## 2022-01-26 ENCOUNTER — Ambulatory Visit (INDEPENDENT_AMBULATORY_CARE_PROVIDER_SITE_OTHER): Payer: Medicare Other

## 2022-01-26 DIAGNOSIS — E538 Deficiency of other specified B group vitamins: Secondary | ICD-10-CM | POA: Diagnosis not present

## 2022-01-26 MED ORDER — CYANOCOBALAMIN 1000 MCG/ML IJ SOLN
1000.0000 ug | Freq: Once | INTRAMUSCULAR | Status: AC
  Administered 2022-01-26: 1000 ug via INTRAMUSCULAR

## 2022-02-20 DIAGNOSIS — J449 Chronic obstructive pulmonary disease, unspecified: Secondary | ICD-10-CM | POA: Diagnosis not present

## 2022-03-01 ENCOUNTER — Encounter: Payer: Self-pay | Admitting: Physician Assistant

## 2022-03-01 ENCOUNTER — Ambulatory Visit (INDEPENDENT_AMBULATORY_CARE_PROVIDER_SITE_OTHER): Payer: Medicare Other | Admitting: Physician Assistant

## 2022-03-01 ENCOUNTER — Ambulatory Visit: Payer: Medicare Other | Admitting: Physician Assistant

## 2022-03-01 DIAGNOSIS — D509 Iron deficiency anemia, unspecified: Secondary | ICD-10-CM | POA: Diagnosis not present

## 2022-03-01 DIAGNOSIS — I700 Atherosclerosis of aorta: Secondary | ICD-10-CM

## 2022-03-01 DIAGNOSIS — J449 Chronic obstructive pulmonary disease, unspecified: Secondary | ICD-10-CM | POA: Diagnosis not present

## 2022-03-01 DIAGNOSIS — I10 Essential (primary) hypertension: Secondary | ICD-10-CM

## 2022-03-01 DIAGNOSIS — E538 Deficiency of other specified B group vitamins: Secondary | ICD-10-CM | POA: Diagnosis not present

## 2022-03-01 DIAGNOSIS — Z1211 Encounter for screening for malignant neoplasm of colon: Secondary | ICD-10-CM

## 2022-03-01 DIAGNOSIS — E785 Hyperlipidemia, unspecified: Secondary | ICD-10-CM

## 2022-03-01 DIAGNOSIS — Z8669 Personal history of other diseases of the nervous system and sense organs: Secondary | ICD-10-CM

## 2022-03-01 MED ORDER — ENALAPRIL MALEATE 20 MG PO TABS: 20.0000 mg | ORAL_TABLET | Freq: Every day | ORAL | 3 refills | Status: DC

## 2022-03-01 MED ORDER — CLOPIDOGREL BISULFATE 75 MG PO TABS: 75.0000 mg | ORAL_TABLET | Freq: Every day | ORAL | 3 refills | Status: DC

## 2022-03-01 MED ORDER — EZETIMIBE 10 MG PO TABS: 10.0000 mg | ORAL_TABLET | Freq: Every day | ORAL | 3 refills | Status: DC

## 2022-03-01 MED ORDER — PHENYTOIN SODIUM EXTENDED 100 MG PO CAPS: 400.0000 mg | ORAL_CAPSULE | Freq: Every day | ORAL | 1 refills | Status: DC

## 2022-03-01 MED ORDER — CYANOCOBALAMIN 1000 MCG/ML IJ SOLN
1000.0000 ug | Freq: Once | INTRAMUSCULAR | Status: AC
  Administered 2022-03-01: 1000 ug via INTRAMUSCULAR

## 2022-03-01 MED ORDER — ATORVASTATIN CALCIUM 80 MG PO TABS: 80.0000 mg | ORAL_TABLET | Freq: Every day | ORAL | 3 refills | Status: DC

## 2022-03-01 NOTE — Progress Notes (Signed)
[facility] [place], [place] [address]  Internal MEDICINE  Office Visit Note  Patient Name: Derek Webb  [id number]  [id number]  Date of Service: 03/01/2022  Chief Complaint  Patient presents with   Follow-up   Gastroesophageal Reflux   Hypertension   Hyperlipidemia    HPI Pt is here for routine follow up and has no complaints today. -Due for colonoscopy next month and did have low hemoglobin last check -Has been taking iron supplement daily and getting b12 shots, but has not gone for repeat cbc and iron panel and will reprint labs and go now -Denies any blood in stool or other changes in stool. Denies any SOB or fatigue. -Breathing stable, followed by pulmonology  Current Medication: Outpatient Encounter Medications as of 03/01/2022  Medication Sig   aspirin 81 MG tablet Take 81 mg by mouth daily.   budesonide-formoterol (SYMBICORT) 160-4.5 MCG/ACT inhaler Inhale 1 puff into the lungs 2 (two) times daily.   Cholecalciferol (VITAMIN D) 2000 UNITS CAPS Take by mouth. VITAMIN D 3 2000 IU  ONE SOFTGEL DAILY  --OTC   OXYGEN Inhale into the lungs. At night   pantoprazole (PROTONIX) 40 MG tablet Take 1 tablet (40 mg total) by mouth daily. IN AM   [DISCONTINUED] atorvastatin (LIPITOR) 80 MG tablet Take 1 tablet (80 mg total) by mouth daily.   [DISCONTINUED] clopidogrel (PLAVIX) 75 MG tablet Take 1 tablet (75 mg total) by mouth daily.   [DISCONTINUED] enalapril (VASOTEC) 20 MG tablet Take 1 tablet (20 mg total) by mouth daily.   [DISCONTINUED] ezetimibe (ZETIA) 10 MG tablet Take 1 tablet (10 mg total) by mouth daily.   [DISCONTINUED] phenytoin (DILANTIN) 100 MG ER capsule Take 4 capsules (400 mg total) by mouth at bedtime.   atorvastatin (LIPITOR) 80 MG tablet Take 1 tablet (80 mg total) by mouth daily.   clopidogrel (PLAVIX) 75 MG tablet Take 1 tablet (75 mg total) by mouth daily.   enalapril (VASOTEC) 20 MG tablet Take 1 tablet (20 mg total) by mouth  daily.   ezetimibe (ZETIA) 10 MG tablet Take 1 tablet (10 mg total) by mouth daily.   phenytoin (DILANTIN) 100 MG ER capsule Take 4 capsules (400 mg total) by mouth at bedtime.   [EXPIRED] cyanocobalamin ((VITAMIN B-12)) injection 1,000 mcg    No facility-administered encounter medications on file as of 03/01/2022.    Surgical History: Past Surgical History:  Procedure Laterality Date   CAROTID SURGERY 2009 -LEFT     COLON POLYP EXCISION     COLON SURGERY     COLONOSCOPY WITH PROPOFOL N/A 04/18/2017   Procedure: COLONOSCOPY WITH PROPOFOL;  Surgeon: Manya Silvas, MD;  Location: [facility] ENDOSCOPY;  Service: Endoscopy;  Laterality: N/A;   ESOPHAGOGASTRODUODENOSCOPY     IR GENERIC HISTORICAL  01/21/2016   IR RADIOLOGIST EVAL & MGMT 01/21/2016 Aletta Edouard, MD [facility] INTERV RAD   IR GENERIC HISTORICAL  10/22/2014   IR RADIOLOGIST EVAL & MGMT 10/22/2014 Aletta Edouard, MD [facility] INTERV RAD   IR RADIOLOGIST EVAL & MGMT  02/02/2017   IR RADIOLOGIST EVAL & MGMT  03/08/2018   percutaneous biopsy      Medical History: Past Medical History:  Diagnosis Date   Alcoholism ([place])    COPD (chronic obstructive pulmonary disease) (HCC)    SEVERE COPD -OCCASIONALLY USES OXYGEN AT NIGHT-DOES NOT USE INHALERS ON REGULAR BASIS   Elevated cholesterol    GERD (gastroesophageal reflux disease)    HOH (hard of hearing)  Hypertension    Lung cancer ([place])    LEFT UPPER LUNG CARCINOMA-NOT A CANDIDATE FOR SURGICAL RESECTION BECAUSE OF HIS COPD AND POOR CANDIDATE FOR RADIATION BECAUSE OF TRANSPORTATION PROBLEMS   Seizures ([place])    CHRONIC DILATIN - PT STATES NO SEIZURES IN PAST COUPLE OF YEARS; PT STATES HIS SEIZURES CAUSED NUMBNESS OF ARMS AND HANDS AND NOT ABLE TO MOVE HIS ARMS   Stroke ([place])    2 TO 3 YRS AGO-AFFECTED HIS SPEECH--ABLE TO AMBULATE WITHOUT ASSIST AND DOES YARD WORK.  DECREASED HEARING IN BOTH EARS SINCE STROKE.   Vitamin D deficiency     Family History: Family History  Problem Relation  Age of Onset   Colon polyps Sister     Social History   Socioeconomic History   Marital status: Single    Spouse name: Not on file   Number of children: Not on file   Years of education: Not on file   Highest education level: Not on file  Occupational History   Not on file  Tobacco Use   Smoking status: Former    Packs/day: 1.00    Years: 35.00    Total pack years: 35.00    Types: Cigarettes    Quit date: 12/22/2011    Years since quitting: 10.1   Smokeless tobacco: Never  Vaping Use   Vaping Use: Never used  Substance and Sexual Activity   Alcohol use: Yes    Comment: occosionally    Drug use: No   Sexual activity: Not on file  Other Topics Concern   Not on file  Social History Narrative   Not on file   Social Determinants of Health   Financial Resource Strain: Low Risk  (11/28/2020)   Overall Financial Resource Strain (CARDIA)    Difficulty of Paying Living Expenses: Not hard at all  Food Insecurity: Not on file  Transportation Needs: Not on file  Physical Activity: Not on file  Stress: Not on file  Social Connections: Not on file  Intimate Partner Violence: Not on file      Review of Systems  Constitutional:  Negative for chills, fatigue and unexpected weight change.  HENT:  Negative for congestion, postnasal drip, rhinorrhea, sneezing and sore throat.   Eyes:  Negative for redness.  Respiratory:  Negative for cough, chest tightness and shortness of breath.   Cardiovascular:  Negative for chest pain and palpitations.  Gastrointestinal:  Negative for abdominal pain, constipation, diarrhea, nausea and vomiting.  Genitourinary:  Negative for dysuria and frequency.  Musculoskeletal:  Negative for arthralgias, back pain, joint swelling and neck pain.  Skin:  Negative for rash.  Neurological:  Negative for tremors and numbness.  Hematological:  Negative for adenopathy. Does not bruise/bleed easily.  Psychiatric/Behavioral:  Negative for behavioral problems  (Depression), sleep disturbance and suicidal ideas. The patient is not nervous/anxious.     Vital Signs: BP 130/88   Pulse 69   Temp 98 F (36.7 C)   Resp 16   Ht 5\' 7"  (1.702 m)   Wt 140 lb 4.8 oz (63.6 kg)   SpO2 96%   BMI 21.97 kg/m    Physical Exam Constitutional:      General: He is not in acute distress.    Appearance: He is well-developed. He is not diaphoretic.  HENT:     Head: Normocephalic and atraumatic.     Mouth/Throat:     Pharynx: No oropharyngeal exudate.  Eyes:     Pupils: Pupils are equal, round, and  reactive to light.  Neck:     Thyroid: No thyromegaly.     Vascular: No JVD.     Trachea: No tracheal deviation.  Cardiovascular:     Rate and Rhythm: Normal rate and regular rhythm.     Heart sounds: Normal heart sounds. No murmur heard.    No friction rub. No gallop.  Pulmonary:     Effort: Pulmonary effort is normal. No respiratory distress.     Breath sounds: No wheezing or rales.  Chest:     Chest wall: No tenderness.  Abdominal:     General: Bowel sounds are normal.     Palpations: Abdomen is soft.  Musculoskeletal:        General: Normal range of motion.     Cervical back: Normal range of motion and neck supple.  Lymphadenopathy:     Cervical: No cervical adenopathy.  Skin:    General: Skin is warm and dry.  Neurological:     Mental Status: He is alert and oriented to person, place, and time.     Cranial Nerves: No cranial nerve deficit.  Psychiatric:        Behavior: Behavior normal.        Thought Content: Thought content normal.        Judgment: Judgment normal.        Assessment/Plan: 1. Essential hypertension Stable, continue current medications - enalapril (VASOTEC) 20 MG tablet; Take 1 tablet (20 mg total) by mouth daily.  Dispense: 90 tablet; Refill: 3 - ezetimibe (ZETIA) 10 MG tablet; Take 1 tablet (10 mg total) by mouth daily.  Dispense: 90 tablet; Refill: 3  2. Iron deficiency anemia, unspecified iron deficiency  anemia type Will go for repeat CBC and iron panel. Has been supplementing iron. Is due for colonoscopy in August and would additionally be beneficial to have this checked if continued low hemoglobin - Ambulatory referral to Gastroenterology  3. B12 deficiency - cyanocobalamin ((VITAMIN B-12)) injection 1,000 mcg  4. Obstructive chronic bronchitis without exacerbation ([place]) Continue inhaler as prescribed and follow up with pulmonology  5. Hyperlipidemia, unspecified hyperlipidemia type Continue current medications - atorvastatin (LIPITOR) 80 MG tablet; Take 1 tablet (80 mg total) by mouth daily.  Dispense: 90 tablet; Refill: 3 - ezetimibe (ZETIA) 10 MG tablet; Take 1 tablet (10 mg total) by mouth daily.  Dispense: 90 tablet; Refill: 3  6. Atherosclerosis of aorta (HCC) - clopidogrel (PLAVIX) 75 MG tablet; Take 1 tablet (75 mg total) by mouth daily.  Dispense: 90 tablet; Refill: 3  7. History of seizure disorder - phenytoin (DILANTIN) 100 MG ER capsule; Take 4 capsules (400 mg total) by mouth at bedtime.  Dispense: 450 capsule; Refill: 1  8. Screening for colorectal cancer - Ambulatory referral to Gastroenterology   General Counseling: Derek Webb understanding of the findings of todays visit and agrees with plan of treatment. I have discussed any further diagnostic evaluation that may be needed or ordered today. We also reviewed his medications today. he has been encouraged to call the office with any questions or concerns that should arise related to todays visit.    Orders Placed This Encounter  Procedures   Ambulatory referral to Gastroenterology    Meds ordered this encounter  Medications   cyanocobalamin ((VITAMIN B-12)) injection 1,000 mcg   atorvastatin (LIPITOR) 80 MG tablet    Sig: Take 1 tablet (80 mg total) by mouth daily.    Dispense:  90 tablet    Refill:  3  clopidogrel (PLAVIX) 75 MG tablet    Sig: Take 1 tablet (75 mg total) by mouth daily.    Dispense:   90 tablet    Refill:  3   enalapril (VASOTEC) 20 MG tablet    Sig: Take 1 tablet (20 mg total) by mouth daily.    Dispense:  90 tablet    Refill:  3   ezetimibe (ZETIA) 10 MG tablet    Sig: Take 1 tablet (10 mg total) by mouth daily.    Dispense:  90 tablet    Refill:  3   phenytoin (DILANTIN) 100 MG ER capsule    Sig: Take 4 capsules (400 mg total) by mouth at bedtime.    Dispense:  450 capsule    Refill:  1    This patient was seen by Drema Dallas, PA-C in collaboration with Dr. Clayborn Bigness as a part of collaborative care agreement.   Total time spent:30 Minutes Time spent includes review of chart, medications, test results, and follow up plan with the patient.      Dr Lavera Guise Internal medicine

## 2022-03-02 ENCOUNTER — Telehealth: Payer: Self-pay

## 2022-03-02 ENCOUNTER — Other Ambulatory Visit: Payer: Self-pay

## 2022-03-02 DIAGNOSIS — Z86010 Personal history of colonic polyps: Secondary | ICD-10-CM

## 2022-03-02 NOTE — Telephone Encounter (Signed)
Gastroenterology Pre-Procedure Review  Request Date: 04/19/22 Requesting Physician: Dr. Vicente Males  PATIENT REVIEW QUESTIONS: The patient's sister responded to the following health history questions as indicated because patient is hard of hearing:    1. Are you having any GI issues? no 2. Do you have a personal history of Polyps? yes (04/18/17 colonoscopy performed by Dr. Dr. Vira Agar) 3. Do you have a family history of Colon Cancer or Polyps? yes (sister had polyps) 4. Diabetes Mellitus? no 5. Joint replacements in the past 12 months?no 6. Major health problems in the past 3 months?no 7. Any artificial heart valves, MVP, or defibrillator?no    MEDICATIONS & ALLERGIES:    Patient reports the following regarding taking any anticoagulation/antiplatelet therapy:   Plavix, Coumadin, Eliquis, Xarelto, Lovenox, Pradaxa, Brilinta, or Effient? yes (Plavix prescribed by Dr. Devona Konig blood thinner sent) Aspirin? yes (81 mg daily)  Patient confirms/reports the following medications:  Current Outpatient Medications  Medication Sig Dispense Refill   aspirin 81 MG tablet Take 81 mg by mouth daily.     atorvastatin (LIPITOR) 80 MG tablet Take 1 tablet (80 mg total) by mouth daily. 90 tablet 3   budesonide-formoterol (SYMBICORT) 160-4.5 MCG/ACT inhaler Inhale 1 puff into the lungs 2 (two) times daily. 1 each 5   Cholecalciferol (VITAMIN D) 2000 UNITS CAPS Take by mouth. VITAMIN D 3 2000 IU  ONE SOFTGEL DAILY  --OTC     clopidogrel (PLAVIX) 75 MG tablet Take 1 tablet (75 mg total) by mouth daily. 90 tablet 3   enalapril (VASOTEC) 20 MG tablet Take 1 tablet (20 mg total) by mouth daily. 90 tablet 3   ezetimibe (ZETIA) 10 MG tablet Take 1 tablet (10 mg total) by mouth daily. 90 tablet 3   OXYGEN Inhale into the lungs. At night     pantoprazole (PROTONIX) 40 MG tablet Take 1 tablet (40 mg total) by mouth daily. IN AM 90 tablet 1   phenytoin (DILANTIN) 100 MG ER capsule Take 4 capsules (400 mg total) by mouth  at bedtime. 450 capsule 1   No current facility-administered medications for this visit.    Patient confirms/reports the following allergies:  No Known Allergies  No orders of the defined types were placed in this encounter.   AUTHORIZATION INFORMATION Primary Insurance: 1D#: Group #:  Secondary Insurance: 1D#: Group #:  SCHEDULE INFORMATION: Date: 04/19/22 Time: Location: [place]

## 2022-03-02 NOTE — Telephone Encounter (Signed)
Received plavix discontinuation for 04/19/22 colonoscopy. Request put Lauren's desk for signature-Toni

## 2022-03-04 ENCOUNTER — Telehealth: Payer: Self-pay

## 2022-03-04 NOTE — Telephone Encounter (Signed)
Medication discontinuation form faxed back to [facility] [phone]

## 2022-03-05 ENCOUNTER — Telehealth: Payer: Self-pay

## 2022-03-05 NOTE — Telephone Encounter (Signed)
Received completed blood thinner request back from Dr. Etta Quill office for patients scheduled colonoscopy 04/19/22.  Dr. Humphrey Rolls indicated on completed Blood Thinner Information Request, "Patient may stop Plavix 0 days prior to procedure".  "No indication to hold".  I contacted Dr. Laurelyn Sickle office to confirm.  Dr. Humphrey Rolls stated that for screening colonoscopy holding plavix is not required.  I explained to her that if polyps were present and removed it would increase the risk for bleeding.  She said patient is high risk and Plavix should not be held.  I told her I would let the physician know.  She said if you would like to discuss with her to call her office.  Her phone number is [phone].  Please advise as to if we need to postpone colonoscopy until patient is able to hold Plavix.  Thanks, [place], [place]

## 2022-03-08 ENCOUNTER — Telehealth: Payer: Self-pay

## 2022-03-08 NOTE — Telephone Encounter (Signed)
Per Dr. Vicente Males- Patients sister has been informed that WE can do the procedure on Plavix, if there are large polyps -they cannot be taken out and would need to be repeated at a later point when can be off plavix.   Patients sister said that in the past her brother has stopped plavix and she doesn't understand why he can't stop it now.  She is uncomfortable with him having the colonoscopy knowing if a polyp is present the colonoscopy would need to be repeated when he can hold the Plavix.  She plans to call Dr. Laurelyn Sickle office to discuss.  Thanks,   [employer]

## 2022-03-09 ENCOUNTER — Telehealth: Payer: Self-pay

## 2022-03-09 NOTE — Telephone Encounter (Signed)
Okay we can send an order to hold plavix 3 days before the procedure

## 2022-03-09 NOTE — Telephone Encounter (Signed)
Spoke to [place] and let her know a colonoscopy can be done on plavix but if there are any large polyps they would have to be rescheduled, or if he is not taking plavix on that day then they would be able to remove large polyps if they find any.

## 2022-03-09 NOTE — Telephone Encounter (Signed)
Spoke to [place] and let her know about the 3 day hold on plavix before the colonoscopy

## 2022-03-16 DIAGNOSIS — H905 Unspecified sensorineural hearing loss: Secondary | ICD-10-CM | POA: Diagnosis not present

## 2022-03-18 ENCOUNTER — Ambulatory Visit
Admission: RE | Admit: 2022-03-18 | Discharge: 2022-03-18 | Disposition: A | Discharge status: Home / Self Care | Payer: Medicare Other | Source: Ambulatory Visit | Attending: Internal Medicine | Admitting: Internal Medicine

## 2022-03-18 ENCOUNTER — Ambulatory Visit
Admission: RE | Admit: 2022-03-18 | Discharge: 2022-03-18 | Disposition: A | Discharge status: Home / Self Care | Payer: Medicare Other | Attending: Physician Assistant | Admitting: Physician Assistant

## 2022-03-18 ENCOUNTER — Ambulatory Visit (INDEPENDENT_AMBULATORY_CARE_PROVIDER_SITE_OTHER): Payer: Medicare Other | Admitting: Internal Medicine

## 2022-03-18 ENCOUNTER — Ambulatory Visit (INDEPENDENT_AMBULATORY_CARE_PROVIDER_SITE_OTHER): Payer: Medicare Other

## 2022-03-18 ENCOUNTER — Encounter: Payer: Self-pay | Admitting: Internal Medicine

## 2022-03-18 VITALS — BP 113/79 | HR 61 | Temp 98.1°F | Resp 16 | Ht 67.0 in | Wt 170.6 lb

## 2022-03-18 DIAGNOSIS — Z8669 Personal history of other diseases of the nervous system and sense organs: Secondary | ICD-10-CM | POA: Diagnosis not present

## 2022-03-18 DIAGNOSIS — R911 Solitary pulmonary nodule: Secondary | ICD-10-CM

## 2022-03-18 DIAGNOSIS — J9811 Atelectasis: Secondary | ICD-10-CM | POA: Insufficient documentation

## 2022-03-18 DIAGNOSIS — E538 Deficiency of other specified B group vitamins: Secondary | ICD-10-CM

## 2022-03-18 DIAGNOSIS — J449 Chronic obstructive pulmonary disease, unspecified: Secondary | ICD-10-CM

## 2022-03-18 MED ORDER — CYANOCOBALAMIN 1000 MCG/ML IJ SOLN
1000.0000 ug | Freq: Once | INTRAMUSCULAR | Status: AC
  Administered 2022-03-18: 1000 ug via INTRAMUSCULAR

## 2022-03-18 NOTE — Progress Notes (Signed)
[facility] [place], [place] [address]  Pulmonary Sleep Medicine   Office Visit Note  Patient Name: Derek Webb DOB: [birth year] MRN [id number]  Date of Service: 03/18/2022  Complaints/HPI: F/u CT chest abnormal the patient has CT scan and there was a finding of a new abrupt cut off of the right upper lobe bronchus with some atelectasis of the right upper lobe consistent with postobstructive changes.  The concern was for an endobronchial tumor obstructing it.  Patient will probably need to have an airway evaluation.  He still has some issues with cough noted.  He is not coughing up any blood.  I discussed the findings with him and recommended that he have an airway evaluation done  ROS  General: (-) fever, (-) chills, (-) night sweats, (-) weakness Skin: (-) rashes, (-) itching,. Eyes: (-) visual changes, (-) redness, (-) itching. Nose and Sinuses: (-) nasal stuffiness or itchiness, (-) postnasal drip, (-) nosebleeds, (-) sinus trouble. Mouth and Throat: (-) sore throat, (-) hoarseness. Neck: (-) swollen glands, (-) enlarged thyroid, (-) neck pain. Respiratory: - cough, (-) bloody sputum, - shortness of breath, - wheezing. Cardiovascular: - ankle swelling, (-) chest pain. Lymphatic: (-) lymph node enlargement. Neurologic: (-) numbness, (-) tingling. Psychiatric: (-) anxiety, (-) depression   Current Medication: Outpatient Encounter Medications as of 03/18/2022  Medication Sig   aspirin 81 MG tablet Take 81 mg by mouth daily.   atorvastatin (LIPITOR) 80 MG tablet Take 1 tablet (80 mg total) by mouth daily.   budesonide-formoterol (SYMBICORT) 160-4.5 MCG/ACT inhaler Inhale 1 puff into the lungs 2 (two) times daily.   Cholecalciferol (VITAMIN D) 2000 UNITS CAPS Take by mouth. VITAMIN D 3 2000 IU  ONE SOFTGEL DAILY  --OTC   clopidogrel (PLAVIX) 75 MG tablet Take 1 tablet (75 mg total) by mouth daily.   enalapril (VASOTEC) 20 MG tablet Take 1 tablet (20 mg  total) by mouth daily.   ezetimibe (ZETIA) 10 MG tablet Take 1 tablet (10 mg total) by mouth daily.   OXYGEN Inhale into the lungs. At night   pantoprazole (PROTONIX) 40 MG tablet Take 1 tablet (40 mg total) by mouth daily. IN AM   phenytoin (DILANTIN) 100 MG ER capsule Take 4 capsules (400 mg total) by mouth at bedtime.   No facility-administered encounter medications on file as of 03/18/2022.    Surgical History: Past Surgical History:  Procedure Laterality Date   CAROTID SURGERY 2009 -LEFT     COLON POLYP EXCISION     COLON SURGERY     COLONOSCOPY WITH PROPOFOL N/A 04/18/2017   Procedure: COLONOSCOPY WITH PROPOFOL;  Surgeon: Manya Silvas, MD;  Location: [facility] ENDOSCOPY;  Service: Endoscopy;  Laterality: N/A;   ESOPHAGOGASTRODUODENOSCOPY     IR GENERIC HISTORICAL  01/21/2016   IR RADIOLOGIST EVAL & MGMT 01/21/2016 Aletta Edouard, MD [facility] INTERV RAD   IR GENERIC HISTORICAL  10/22/2014   IR RADIOLOGIST EVAL & MGMT 10/22/2014 Aletta Edouard, MD [facility] INTERV RAD   IR RADIOLOGIST EVAL & MGMT  02/02/2017   IR RADIOLOGIST EVAL & MGMT  03/08/2018   percutaneous biopsy      Medical History: Past Medical History:  Diagnosis Date   Alcoholism ([place])    COPD (chronic obstructive pulmonary disease) (HCC)    SEVERE COPD -OCCASIONALLY USES OXYGEN AT NIGHT-DOES NOT USE INHALERS ON REGULAR BASIS   Elevated cholesterol    GERD (gastroesophageal reflux disease)    HOH (hard of hearing)  Hypertension    Lung cancer ([place])    LEFT UPPER LUNG CARCINOMA-NOT A CANDIDATE FOR SURGICAL RESECTION BECAUSE OF HIS COPD AND POOR CANDIDATE FOR RADIATION BECAUSE OF TRANSPORTATION PROBLEMS   Seizures ([place])    CHRONIC DILATIN - PT STATES NO SEIZURES IN PAST COUPLE OF YEARS; PT STATES HIS SEIZURES CAUSED NUMBNESS OF ARMS AND HANDS AND NOT ABLE TO MOVE HIS ARMS   Stroke ([place])    2 TO 3 YRS AGO-AFFECTED HIS SPEECH--ABLE TO AMBULATE WITHOUT ASSIST AND DOES YARD WORK.  DECREASED HEARING IN BOTH EARS SINCE STROKE.    Vitamin D deficiency     Family History: Family History  Problem Relation Age of Onset   Colon polyps Sister     Social History: Social History   Socioeconomic History   Marital status: Single    Spouse name: Not on file   Number of children: Not on file   Years of education: Not on file   Highest education level: Not on file  Occupational History   Not on file  Tobacco Use   Smoking status: Former    Packs/day: 1.00    Years: 35.00    Total pack years: 35.00    Types: Cigarettes    Quit date: 12/22/2011    Years since quitting: 10.2   Smokeless tobacco: Never  Vaping Use   Vaping Use: Never used  Substance and Sexual Activity   Alcohol use: Yes    Comment: occosionally    Drug use: No   Sexual activity: Not on file  Other Topics Concern   Not on file  Social History Narrative   Not on file   Social Determinants of Health   Financial Resource Strain: Low Risk  (11/28/2020)   Overall Financial Resource Strain (CARDIA)    Difficulty of Paying Living Expenses: Not hard at all  Food Insecurity: Not on file  Transportation Needs: Not on file  Physical Activity: Not on file  Stress: Not on file  Social Connections: Not on file  Intimate Partner Violence: Not on file    Vital Signs: Blood pressure 113/79, pulse 61, temperature 98.1 F (36.7 C), resp. rate 16, height 5\' 7"  (1.702 m), weight 170 lb 9.6 oz (77.4 kg), SpO2 97 %.  Examination: General Appearance: The patient is well-developed, well-nourished, and in no distress. Skin: Gross inspection of skin unremarkable. Head: normocephalic, no gross deformities. Eyes: no gross deformities noted. ENT: ears appear grossly normal no exudates. Neck: Supple. No thyromegaly. No LAD. Respiratory: few rhochi noted. Cardiovascular: Normal S1 and S2 without murmur or rub. Extremities: No cyanosis. pulses are equal. Neurologic: Alert and oriented. No involuntary movements.  LABS: No results found for this or any  previous visit (from the past 2160 hour(s)).  Radiology: CT Chest Wo Contrast  Result Date: 12/24/2021 CLINICAL DATA:  Multiple lung nodules seen on prior exam. History of prior left upper lobe non-small cell lung cancer status post percutaneous microwave ablation 05/05/2022. EXAM: CT CHEST WITHOUT CONTRAST TECHNIQUE: Multidetector CT imaging of the chest was performed following the standard protocol without IV contrast. RADIATION DOSE REDUCTION: This exam was performed according to the departmental dose-optimization program which includes automated exposure control, adjustment of the mA and/or kV according to patient size and/or use of iterative reconstruction technique. COMPARISON:  Prior CT scan of the chest 02/22/2020 FINDINGS: Cardiovascular: Limited evaluation in the absence of intravenous contrast. Atherosclerotic calcifications present throughout the aorta and branch arteries. The ascending thoracic aorta is mildly aneurysmal at 4.2 cm  in maximal diameter. The aorta tapers to normal caliber by the isthmus. The descending thoracic aorta is normal in caliber. The root is normal in caliber. The heart is normal in size. No pericardial effusion. Normal caliber main pulmonary artery. Scattered atherosclerotic plaque throughout the coronary arteries. Mediastinum/Nodes: Unremarkable CT appearance of the thyroid gland. No suspicious mediastinal or hilar adenopathy. No soft tissue mediastinal mass. The thoracic esophagus is unremarkable. Lungs/Pleura: Continued stability of left upper lobe post ablation defect measuring approximately 2.9 x 0.9 cm. No new nodularity or evidence of recurrent mass in this region. Background of advanced combined centrilobular and paraseptal pulmonary emphysema. New abrupt cut off of the right upper lobe bronchus with resultant significant atelectasis in the right upper lobe. Patchy airspace opacities present in a peribronchovascular distribution extending from the hilum superiorly  consistent with postobstructive changes. This is new compared to prior imaging. Otherwise, no new pulmonary parenchymal findings. Upper Abdomen: Stable left adrenal adenoma. No acute abnormality within the visualized upper abdomen. Irregular calcification in the left upper quadrant remains unchanged. Musculoskeletal: No chest wall mass or suspicious bone lesions identified. IMPRESSION: 1. New abrupt and slightly irregular cut off of the right upper lobe bronchus with associated right upper lobe atelectasis and postobstructive changes. Findings are concerning for endobronchial malignancy given patient's clinical history of prior left upper lobe non-small cell lung cancer. Recommend further evaluation with bronchoscopy. 2. Stable appearance of left upper lobe ablation defect without evidence of local recurrence. 3. Aortic and coronary artery atherosclerotic vascular calcifications. 4. 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 5. 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 Recommend annual imaging followup by CTA or MRA. This recommendation follows 2010 ACCF/AHA/AATS/ACR/ASA/SCA/SCAI/SIR/STS/SVM Guidelines for the Diagnosis and Management of Patients with Thoracic Aortic Disease. Circulation. 2010; 121: Z660-Y301. Aortic aneurysm NOS (ICD10-I71.9) 6. Additional ancillary findings as above without significant interval change. Aortic aneurysm NOS (ICD10-I71.9); aortic Atherosclerosis (ICD10-I70.0) and Emphysema (ICD10-J43.9). Electronically Signed   By: Jacqulynn Cadet M.D.   On: 12/24/2021 09:27    No results found.  No results found.    Assessment and Plan: Patient Active Problem List   Diagnosis Date Noted   History of lung cancer 04/21/2020   Seizures ([place]) 07/20/2019   Encounter for therapeutic drug level monitoring 07/20/2019   Need for vaccination against Streptococcus pneumoniae using pneumococcal conjugate vaccine 13 03/05/2019   GERD  (gastroesophageal reflux disease) 02/08/2018   Obstructive chronic bronchitis without exacerbation ([place]) 02/08/2018   Encounter for general adult medical examination with abnormal findings 12/18/2017   Atherosclerosis of aorta ([place]) 12/18/2017   Dysuria 12/18/2017   Cerebrovascular accident (CVA) ([place]) 11/01/2017   HOH (hard of hearing) 11/01/2017   Essential hypertension 11/01/2017   Hyperlipidemia 11/01/2017   Hx of adenomatous polyp of colon 12/16/2016   Squamous cell lung cancer ([place])    Lung cancer, 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[place]) 05/07/2012    1. Lung nodule seen on imaging study He does need to have an airway evaluation done also would consider getting a PET scan done for initial evaluation as he does have a history of prior non-small cell lung cancer. - NM PET Image Initial (PI) Skull Base To Thigh (F-18 FDG); Future - Bronchoscopy; Future - DG Chest 2 View; Future  2. Obstructive chronic bronchitis without exacerbation ([place]) This actually appears to be stable plan is going to be to continue with current medical management  3. History of seizure disorder No signs of active seizures we will continue to monitor  closely.  4. Atelectasis of right lung We will going to get a follow-up chest film to see if there is any opening of the aeration abnormality - DG Chest 2 View; Future   General Counseling: I have discussed the findings of the evaluation and examination with Jeneen Rinks.  I have also discussed any further diagnostic evaluation thatmay be needed or ordered today. Elray verbalizes understanding of the findings of todays visit. We also reviewed his medications today and discussed drug interactions and side effects including but not limited excessive drowsiness and altered mental states. We also discussed that there is always a risk not just to him but also people around him. he has been encouraged to call the office with any questions or concerns that should arise related to todays visit.  No  orders of the defined types were placed in this encounter.    Time spent: 61  I have personally obtained a history, examined the patient, evaluated laboratory and imaging results, formulated the assessment and plan and placed orders.    Allyne Gee, MD [facility] Pulmonary and Critical Care Sleep medicine

## 2022-03-18 NOTE — Patient Instructions (Signed)
Chronic Obstructive Pulmonary Disease  Chronic obstructive pulmonary disease (COPD) is a long-term (chronic) lung problem. When you have COPD, it is hard for air to get in and out of your lungs. Usually the condition gets worse over time, and your lungs will never return to normal. There are things you can do to keep yourself as healthy as possible. What are the causes? Smoking. This is the most common cause. Certain genes passed from parent to child (inherited). What increases the risk? Being exposed to secondhand smoke from cigarettes, pipes, or cigars. Being exposed to chemicals and other irritants, such as fumes and dust in the work environment. Having chronic lung conditions or infections. What are the signs or symptoms? Shortness of breath, especially during physical activity. A long-term cough with a large amount of thick mucus. Sometimes, the cough may not have any mucus (dry cough). Wheezing. Breathing quickly. Skin that looks gray or blue, especially in the fingers, toes, or lips. Feeling tired (fatigue). Weight loss. Chest tightness. Having infections often. Episodes when breathing symptoms become much worse (exacerbations). At the later stages of this disease, you may have swelling in the ankles, feet, or legs. How is this treated? Taking medicines. Quitting smoking, if you smoke. Rehabilitation. This includes steps to make your body work better. It may involve a team of specialists. Doing exercises. Making changes to your diet. Using oxygen. Lung surgery. Lung transplant. Comfort measures (palliative care). Follow these instructions at home: Medicines Take over-the-counter and prescription medicines only as told by your doctor. Talk to your doctor before taking any cough or allergy medicines. You may need to avoid medicines that cause your lungs to be dry. Lifestyle If you smoke, stop smoking. Smoking makes the problem worse. Do not smoke or use any products that  contain nicotine or tobacco. If you need help quitting, ask your doctor. Avoid being around things that make your breathing worse. This may include smoke, chemicals, and fumes. Stay active, but remember to rest as well. Learn and use tips on how to manage stress and control your breathing. Make sure you get enough sleep. Most adults need at least 7 hours of sleep every night. Eat healthy foods. Eat smaller meals more often. Rest before meals. Controlled breathing Learn and use tips on how to control your breathing as told by your doctor. Try: Breathing in (inhaling) through your nose for 1 second. Then, pucker your lips and breath out (exhale) through your lips for 2 seconds. Putting one hand on your belly (abdomen). Breathe in slowly through your nose for 1 second. Your hand on your belly should move out. Pucker your lips and breathe out slowly through your lips. Your hand on your belly should move in as you breathe out.  Controlled coughing Learn and use controlled coughing to clear mucus from your lungs. Follow these steps: Lean your head a little forward. Breathe in deeply. Try to hold your breath for 3 seconds. Keep your mouth slightly open while coughing 2 times. Spit any mucus out into a tissue. Rest and do the steps again 1 or 2 times as needed. General instructions Make sure you get all the shots (vaccines) that your doctor recommends. Ask your doctor about a flu shot and a pneumonia shot. Use oxygen therapy and pulmonary rehabilitation if told by your doctor. If you need home oxygen therapy, ask your doctor if you should buy a tool to measure your oxygen level (oximeter). Make a COPD action plan with your doctor. This helps you   to know what to do if you feel worse than usual. Manage any other conditions you have as told by your doctor. Avoid going outside when it is very hot, cold, or humid. Avoid people who have a sickness you can catch (contagious). Keep all follow-up  visits. Contact a doctor if: You cough up more mucus than usual. There is a change in the color or thickness of the mucus. It is harder to breathe than usual. Your breathing is faster than usual. You have trouble sleeping. You need to use your medicines more often than usual. You have trouble doing your normal activities such as getting dressed or walking around the house. Get help right away if: You have shortness of breath while resting. You have shortness of breath that stops you from: Being able to talk. Doing normal activities. Your chest hurts for longer than 5 minutes. Your skin color is more blue than usual. Your pulse oximeter shows that you have low oxygen for longer than 5 minutes. You have a fever. You feel too tired to breathe normally. These symptoms may represent a serious problem that is an emergency. Do not wait to see if the symptoms will go away. Get medical help right away. Call your local emergency services (911 in the [place] Do not drive yourself to the hospital. Summary Chronic obstructive pulmonary disease (COPD) is a long-term lung problem. The way your lungs work will never return to normal. Usually the condition gets worse over time. There are things you can do to keep yourself as healthy as possible. Take over-the-counter and prescription medicines only as told by your doctor. If you smoke, stop. Smoking makes the problem worse. This information is not intended to replace advice given to you by your health care provider. Make sure you discuss any questions you have with your health care provider. Document Revised: 06/17/2020 Document Reviewed: 06/17/2020 Elsevier Patient Education  2023 Elsevier Inc.  

## 2022-03-19 ENCOUNTER — Telehealth: Payer: Self-pay

## 2022-03-19 NOTE — Telephone Encounter (Signed)
Awaiting 03/18/22 notes to fax clinicals for pending PET scan authorization-Toni

## 2022-03-22 ENCOUNTER — Encounter: Payer: Self-pay | Admitting: Internal Medicine

## 2022-03-22 DIAGNOSIS — D509 Iron deficiency anemia, unspecified: Secondary | ICD-10-CM | POA: Diagnosis not present

## 2022-03-23 ENCOUNTER — Ambulatory Visit: Payer: Medicare Other

## 2022-03-23 DIAGNOSIS — J449 Chronic obstructive pulmonary disease, unspecified: Secondary | ICD-10-CM | POA: Diagnosis not present

## 2022-03-23 LAB — CBC WITH DIFFERENTIAL/PLATELET
Basophils Absolute: 0.1 10*3/uL (ref 0.0–0.2)
Basos: 1 %
EOS (ABSOLUTE): 0.1 10*3/uL (ref 0.0–0.4)
Eos: 1 %
Hematocrit: 35.8 % — ABNORMAL LOW (ref 37.5–51.0)
Hemoglobin: 12.4 g/dL — ABNORMAL LOW (ref 13.0–17.7)
Immature Grans (Abs): 0 10*3/uL (ref 0.0–0.1)
Immature Granulocytes: 0 %
Lymphocytes Absolute: 1.8 10*3/uL (ref 0.7–3.1)
Lymphs: 29 %
MCH: 30 pg (ref 26.6–33.0)
MCHC: 34.6 g/dL (ref 31.5–35.7)
MCV: 87 fL (ref 79–97)
Monocytes Absolute: 0.7 10*3/uL (ref 0.1–0.9)
Monocytes: 12 %
Neutrophils Absolute: 3.6 10*3/uL (ref 1.4–7.0)
Neutrophils: 57 %
Platelets: 456 10*3/uL — ABNORMAL HIGH (ref 150–450)
RBC: 4.13 x10E6/uL — ABNORMAL LOW (ref 4.14–5.80)
RDW: 15.6 % — ABNORMAL HIGH (ref 11.6–15.4)
WBC: 6.2 10*3/uL (ref 3.4–10.8)

## 2022-03-23 LAB — IRON,TIBC AND FERRITIN PANEL
Ferritin: 92 ng/mL (ref 30–400)
Iron Saturation: 51 % (ref 15–55)
Iron: 122 ug/dL (ref 38–169)
Total Iron Binding Capacity: 240 ug/dL — ABNORMAL LOW (ref 250–450)
UIBC: 118 ug/dL (ref 111–343)

## 2022-03-24 ENCOUNTER — Telehealth: Payer: Self-pay

## 2022-03-24 ENCOUNTER — Encounter: Payer: Self-pay | Admitting: Physician Assistant

## 2022-03-24 NOTE — Telephone Encounter (Signed)
Spoke with patient's sister regarding lab results.

## 2022-03-24 NOTE — Telephone Encounter (Signed)
-----   Message from Mylinda Latina, PA-C sent at 03/23/2022  4:35 PM EDT ----- Please let him know that his labs are improving

## 2022-03-25 ENCOUNTER — Encounter: Payer: Self-pay | Admitting: Internal Medicine

## 2022-03-25 NOTE — Telephone Encounter (Signed)
Can you look into this pls

## 2022-03-26 ENCOUNTER — Telehealth: Payer: Self-pay

## 2022-03-26 NOTE — Telephone Encounter (Signed)
Received epic secure chat from [place] with Dr. Laurelyn Sickle office. She requested that we cancel 03/30/2022 appt per Dr. Laurelyn Sickle request.  Appt has been canceled.  Nothing further needed.

## 2022-03-26 NOTE — Telephone Encounter (Signed)
Lvm for sister to return call regarding PET appointment-Toni

## 2022-03-26 NOTE — Telephone Encounter (Signed)
Received call from [facility] with Dr. Laurelyn Sickle office. Dr. Humphrey Rolls would like to refer patient for bronch.  Appt scheduled 03/30/2022 at 2:00.  Lm to make patient aware.

## 2022-03-30 ENCOUNTER — Institutional Professional Consult (permissible substitution): Payer: Medicare Other | Admitting: Pulmonary Disease

## 2022-04-01 ENCOUNTER — Encounter
Admission: RE | Admit: 2022-04-01 | Discharge: 2022-04-01 | Disposition: A | Discharge status: Home / Self Care | Payer: Medicare Other | Source: Ambulatory Visit | Attending: Internal Medicine | Admitting: Internal Medicine

## 2022-04-01 VITALS — Ht 67.0 in | Wt 171.1 lb

## 2022-04-01 DIAGNOSIS — I10 Essential (primary) hypertension: Secondary | ICD-10-CM

## 2022-04-01 DIAGNOSIS — I700 Atherosclerosis of aorta: Secondary | ICD-10-CM

## 2022-04-01 DIAGNOSIS — I6310 Cerebral infarction due to embolism of unspecified precerebral artery: Secondary | ICD-10-CM

## 2022-04-01 NOTE — Patient Instructions (Addendum)
Your procedure is scheduled on: 04/09/22 Report to [facility]. To find out your arrival time please call [phone] between 1PM - 3PM on 04/08/22.  Remember: Instructions that are not followed completely may result in serious medical risk, up to and including death, or upon the discretion of your surgeon and anesthesiologist your surgery may need to be rescheduled.     _X__ 1. Do not eat food or drink any liquids after midnight the night before your procedure.                 No gum chewing or hard candies.   __X__2.  On the morning of surgery brush your teeth with toothpaste and water, you                 may rinse your mouth with mouthwash if you wish.  Do not swallow any              toothpaste of mouthwash.     _X__ 3.  No Alcohol for 24 hours before or after surgery.   _X__ 4.  Do Not Smoke or use e-cigarettes For 24 Hours Prior to Your Surgery.                 Do not use any chewable tobacco products for at least 6 hours prior to                 surgery.  ____  5.  Bring all medications with you on the day of surgery if instructed.   __X__  6.  Notify your doctor if there is any change in your medical condition      (cold, fever, infections).     Do not wear jewelry, make-up, hairpins, clips or nail polish. SHOWER THE MORNING OF YOUR PROCEDURE Do not wear lotions, powders, or perfumes.  Do not shave body hair 48 hours prior to surgery. Men may shave face and neck. Do not bring valuables to the hospital.    [facility] is not responsible for any belongings or valuables.  Contacts, dentures/partials or body piercings may not be worn into surgery. Bring a case for your contacts, glasses or hearing aids, a denture cup will be supplied. Leave your suitcase in the car. After surgery it may be brought to your room. For patients admitted to the hospital, discharge time is determined by your treatment team.   Patients discharged  the day of surgery will not be allowed to drive home.     __X__ Take these medicines the morning of surgery with A SIP OF WATER:    1. ezetimibe (ZETIA) 10 MG tablet  2. pantoprazole (PROTONIX) 40 MG tablet  3.   4.  5.  6.  ____ Fleet Enema (as directed)   ____ Use CHG Soap/SAGE wipes as directed  ____ Use inhalers on the day of surgery  ____ Stop metformin/Janumet/Farxiga 2 days prior to surgery    ____ Take 1/2 of usual insulin dose the night before surgery. No insulin the morning          of surgery.   __X__ Stop Blood Thinners clopidogrel (PLAVIX) 75 MG tablet and aspirin 81 MG tablet Monday 04/05/22 (last doses Sunday 04/04/22)  __X__ Stop Anti-inflammatories 7 days before surgery such as Advil, Ibuprofen, Motrin,  BC or Goodies Powder, Naprosyn, Naproxen, Aleve   __X__ Stop all herbals and supplements, fish oil or vitamins  until after surgery.  ____ Bring C-Pap to the hospital.   Call [phone] for any questions. If you would like to pick up the Advance Directive booklet you may do so at [place], [address].

## 2022-04-07 ENCOUNTER — Other Ambulatory Visit: Payer: Medicare Other

## 2022-04-08 ENCOUNTER — Encounter: Payer: Self-pay | Admitting: Urgent Care

## 2022-04-08 ENCOUNTER — Encounter
Admission: RE | Admit: 2022-04-08 | Discharge: 2022-04-08 | Disposition: A | Discharge status: Home / Self Care | Payer: Medicare Other | Source: Ambulatory Visit | Attending: Internal Medicine | Admitting: Internal Medicine

## 2022-04-08 DIAGNOSIS — Z20822 Contact with and (suspected) exposure to covid-19: Secondary | ICD-10-CM | POA: Insufficient documentation

## 2022-04-08 DIAGNOSIS — Z85118 Personal history of other malignant neoplasm of bronchus and lung: Secondary | ICD-10-CM | POA: Diagnosis not present

## 2022-04-08 DIAGNOSIS — I6310 Cerebral infarction due to embolism of unspecified precerebral artery: Secondary | ICD-10-CM | POA: Diagnosis not present

## 2022-04-08 DIAGNOSIS — Z01818 Encounter for other preprocedural examination: Secondary | ICD-10-CM | POA: Insufficient documentation

## 2022-04-08 DIAGNOSIS — I10 Essential (primary) hypertension: Secondary | ICD-10-CM | POA: Diagnosis not present

## 2022-04-08 DIAGNOSIS — Z1152 Encounter for screening for COVID-19: Secondary | ICD-10-CM

## 2022-04-08 DIAGNOSIS — I700 Atherosclerosis of aorta: Secondary | ICD-10-CM | POA: Insufficient documentation

## 2022-04-08 DIAGNOSIS — R911 Solitary pulmonary nodule: Secondary | ICD-10-CM | POA: Diagnosis not present

## 2022-04-08 DIAGNOSIS — C3412 Malignant neoplasm of upper lobe, left bronchus or lung: Secondary | ICD-10-CM | POA: Diagnosis not present

## 2022-04-08 LAB — GLUCOSE, CAPILLARY: Glucose-Capillary: 97 mg/dL (ref 70–99)

## 2022-04-08 MED ORDER — FLUDEOXYGLUCOSE F - 18 (FDG) INJECTION
8.9000 | Freq: Once | INTRAVENOUS | Status: AC | PRN
  Administered 2022-04-08: 9.73 via INTRAVENOUS

## 2022-04-09 ENCOUNTER — Ambulatory Visit: Payer: Medicare Other | Admitting: Certified Registered Nurse Anesthetist

## 2022-04-09 ENCOUNTER — Ambulatory Visit
Admission: RE | Admit: 2022-04-09 | Discharge: 2022-04-09 | Disposition: A | Discharge status: Home / Self Care | Payer: Medicare Other | Source: Ambulatory Visit | Attending: Internal Medicine | Admitting: Internal Medicine

## 2022-04-09 ENCOUNTER — Other Ambulatory Visit: Payer: Self-pay

## 2022-04-09 ENCOUNTER — Encounter: Payer: Self-pay | Admitting: Internal Medicine

## 2022-04-09 ENCOUNTER — Encounter
Admission: RE | Disposition: A | Discharge status: Home / Self Care | Payer: Self-pay | Source: Ambulatory Visit | Attending: Internal Medicine

## 2022-04-09 DIAGNOSIS — Z87891 Personal history of nicotine dependence: Secondary | ICD-10-CM | POA: Insufficient documentation

## 2022-04-09 DIAGNOSIS — Z7982 Long term (current) use of aspirin: Secondary | ICD-10-CM | POA: Diagnosis not present

## 2022-04-09 DIAGNOSIS — J449 Chronic obstructive pulmonary disease, unspecified: Secondary | ICD-10-CM | POA: Insufficient documentation

## 2022-04-09 DIAGNOSIS — Z8673 Personal history of transient ischemic attack (TIA), and cerebral infarction without residual deficits: Secondary | ICD-10-CM | POA: Insufficient documentation

## 2022-04-09 DIAGNOSIS — Z7901 Long term (current) use of anticoagulants: Secondary | ICD-10-CM | POA: Insufficient documentation

## 2022-04-09 DIAGNOSIS — Z539 Procedure and treatment not carried out, unspecified reason: Secondary | ICD-10-CM | POA: Diagnosis not present

## 2022-04-09 DIAGNOSIS — K219 Gastro-esophageal reflux disease without esophagitis: Secondary | ICD-10-CM | POA: Diagnosis not present

## 2022-04-09 DIAGNOSIS — H9190 Unspecified hearing loss, unspecified ear: Secondary | ICD-10-CM | POA: Insufficient documentation

## 2022-04-09 LAB — SARS CORONAVIRUS 2 (TAT 6-24 HRS): SARS Coronavirus 2: NEGATIVE

## 2022-04-09 SURGERY — VIDEO BRONCHOSCOPY WITHOUT FLUORO
Anesthesia: General

## 2022-04-09 MED ORDER — GLYCOPYRROLATE 0.2 MG/ML IJ SOLN
INTRAMUSCULAR | Status: AC
  Filled 2022-04-09: qty 1

## 2022-04-09 MED ORDER — PROPOFOL 10 MG/ML IV BOLUS
INTRAVENOUS | Status: AC
  Filled 2022-04-09: qty 20

## 2022-04-09 MED ORDER — LIDOCAINE HCL (PF) 2 % IJ SOLN
INTRAMUSCULAR | Status: AC
Start: 2022-04-09 — End: ?
  Filled 2022-04-09: qty 5

## 2022-04-09 MED ORDER — FENTANYL CITRATE (PF) 100 MCG/2ML IJ SOLN
INTRAMUSCULAR | Status: AC
  Filled 2022-04-09: qty 2

## 2022-04-09 MED ORDER — MIDAZOLAM HCL 2 MG/2ML IJ SOLN
INTRAMUSCULAR | Status: AC
  Filled 2022-04-09: qty 2

## 2022-04-09 MED ORDER — ONDANSETRON HCL 4 MG/2ML IJ SOLN
INTRAMUSCULAR | Status: AC
  Filled 2022-04-09: qty 2

## 2022-04-09 MED ORDER — PROPOFOL 10 MG/ML IV BOLUS
INTRAVENOUS | Status: AC
Start: 2022-04-09 — End: ?
  Filled 2022-04-09: qty 20

## 2022-04-09 NOTE — Anesthesia Preprocedure Evaluation (Signed)
Anesthesia Evaluation  Patient identified by MRN, date of birth, ID band Patient awake    Reviewed: Allergy & Precautions, NPO status , Patient's Chart, lab work & pertinent test results  History of Anesthesia Complications Negative for: history of anesthetic complications  Airway Mallampati: III  TM Distance: >3 FB Neck ROM: full    Dental  (+) Missing   Pulmonary COPD,  COPD inhaler and oxygen dependent, former smoker,    Pulmonary exam normal        Cardiovascular hypertension, (-) Past MI Normal cardiovascular exam     Neuro/Psych Seizures -,  CVA negative psych ROS   GI/Hepatic Neg liver ROS, GERD  Controlled,  Endo/Other  negative endocrine ROS  Renal/GU      Musculoskeletal   Abdominal   Peds  Hematology negative hematology ROS (+)   Anesthesia Other Findings Past Medical History: No date: Alcoholism ([place]) No date: COPD (chronic obstructive pulmonary disease) (HCC)     Comment:  SEVERE COPD -OCCASIONALLY USES OXYGEN AT NIGHT-DOES NOT               USE INHALERS ON REGULAR BASIS No date: Elevated cholesterol No date: GERD (gastroesophageal reflux disease) No date: HOH (hard of hearing) No date: Hypertension No date: Lung cancer ([place])     Comment:  LEFT UPPER LUNG CARCINOMA-NOT A CANDIDATE FOR SURGICAL               RESECTION BECAUSE OF HIS COPD AND POOR CANDIDATE FOR               RADIATION BECAUSE OF TRANSPORTATION PROBLEMS No date: Seizures ([place])     Comment:  CHRONIC DILATIN - PT STATES NO SEIZURES IN PAST COUPLE               OF YEARS; PT STATES HIS SEIZURES CAUSED NUMBNESS OF ARMS               AND HANDS AND NOT ABLE TO MOVE HIS ARMS No date: Stroke ([place])     Comment:  2 TO 3 YRS AGO-AFFECTED HIS SPEECH--ABLE TO AMBULATE               WITHOUT ASSIST AND DOES YARD WORK.  DECREASED HEARING IN               BOTH EARS SINCE STROKE. No date: Vitamin D deficiency  Past Surgical History: No date:  CAROTID SURGERY 2009 -LEFT No date: COLON POLYP EXCISION No date: COLON SURGERY 04/18/2017: COLONOSCOPY WITH PROPOFOL; N/A     Comment:  Procedure: COLONOSCOPY WITH PROPOFOL;  Surgeon: Manya Silvas, MD;  Location: [facility] ENDOSCOPY;  Service:               Endoscopy;  Laterality: N/A; No date: ESOPHAGOGASTRODUODENOSCOPY 01/21/2016: IR GENERIC HISTORICAL     Comment:  IR RADIOLOGIST EVAL & MGMT 01/21/2016 Aletta Edouard, MD               [facility] INTERV RAD 10/22/2014: IR GENERIC HISTORICAL     Comment:  IR RADIOLOGIST EVAL & MGMT 10/22/2014 Aletta Edouard, MD               [facility] INTERV RAD 02/02/2017: IR RADIOLOGIST EVAL & MGMT 03/08/2018: IR RADIOLOGIST EVAL & MGMT No date: percutaneous biopsy  BMI    Body Mass Index: 26.79 kg/m      Reproductive/Obstetrics negative OB ROS  Anesthesia Physical Anesthesia Plan  ASA: 3  Anesthesia Plan: General ETT   Post-op Pain Management:    Induction: Intravenous  PONV Risk Score and Plan: Ondansetron, Dexamethasone, Midazolam and Treatment may vary due to age or medical condition  Airway Management Planned: Oral ETT  Additional Equipment:   Intra-op Plan:   Post-operative Plan: Extubation in OR  Informed Consent: I have reviewed the patients History and Physical, chart, labs and discussed the procedure including the risks, benefits and alternatives for the proposed anesthesia with the patient or authorized representative who has indicated his/her understanding and acceptance.     Dental Advisory Given  Plan Discussed with: Anesthesiologist, CRNA and Surgeon  Anesthesia Plan Comments: (Patient consented for risks of anesthesia including but not limited to:  - adverse reactions to medications - damage to eyes, teeth, lips or other oral mucosa - nerve damage due to positioning  - sore throat or hoarseness - Damage to heart, brain, nerves, lungs, other parts of body or  loss of life  Patient voiced understanding.)        Anesthesia Quick Evaluation

## 2022-04-09 NOTE — Progress Notes (Signed)
Patient does not know why he is here today, states he had no idea he had to be here.  Sister at bedside, she denies knowing what is being done today.  When asked if the patient had been having any problems with his lungs she stated "not to my knowledge".  Patient denies any pulmonary problems, states he has not used any O2 at home for months, states he had a cough but that has not bothered him in a "long time".  States "I try to stay active and walk and that helps".  Patient did not stop aspirin or Plavix.  Sister states no one told them that he would need to be off of the plavix and aspirin.  Dr. Humphrey Rolls was called to discuss, he spoke with the sister-due to patient's difficulty hearing, over the phone.  Procedure will be rescheduled.  Sister was shown where the instructions in MyChart were located, also instructions were printed and given to her.  Patient will see Dr. Humphrey Rolls on Tuesday to re-discuss.

## 2022-04-09 NOTE — Progress Notes (Signed)
   04/09/22 0744  Clinical Encounter Type  Visited With Patient and family together  Visit Type Initial;Pre-op   Chaplain provided pre-op spiritual care support.

## 2022-04-13 ENCOUNTER — Encounter: Payer: Self-pay | Admitting: Internal Medicine

## 2022-04-13 ENCOUNTER — Other Ambulatory Visit: Payer: Self-pay | Admitting: Internal Medicine

## 2022-04-13 ENCOUNTER — Ambulatory Visit (INDEPENDENT_AMBULATORY_CARE_PROVIDER_SITE_OTHER): Payer: Medicare Other | Admitting: Internal Medicine

## 2022-04-13 VITALS — BP 111/64 | HR 71 | Temp 98.2°F | Resp 16 | Ht 67.0 in | Wt 167.0 lb

## 2022-04-13 DIAGNOSIS — R918 Other nonspecific abnormal finding of lung field: Secondary | ICD-10-CM | POA: Diagnosis not present

## 2022-04-13 DIAGNOSIS — J449 Chronic obstructive pulmonary disease, unspecified: Secondary | ICD-10-CM

## 2022-04-13 NOTE — Progress Notes (Signed)
[facility] [place], [place] [address]  Pulmonary Sleep Medicine   Office Visit Note  Patient Name: Derek Webb DOB: Sep 12, 1950 MRN [id number]  Date of Service: 04/13/2022  Complaints/HPI: Follow up after PET scan. The scan shows a rather large mass of the right lung. Patient has high SUV uptake in that area. In addition the mass may be accesible by CT guided biopsy.  I reviewed the PET scan with the patient and showed him the pictures and reviewed possible approaches to getting the biopsy.  The patient was supposed to have come in for a bronchoscopy unfortunately there was some miscommunication and so the procedure was not done.  In any case may be better option to actually approach with a CT-guided biopsy based on the PET scan findings.  If this is not an approach we will then have to reschedule him for a bronchoscopy  ROS  General: (-) fever, (-) chills, (-) night sweats, (-) weakness Skin: (-) rashes, (-) itching,. Eyes: (-) visual changes, (-) redness, (-) itching. Nose and Sinuses: (-) nasal stuffiness or itchiness, (-) postnasal drip, (-) nosebleeds, (-) sinus trouble. Mouth and Throat: (-) sore throat, (-) hoarseness. Neck: (-) swollen glands, (-) enlarged thyroid, (-) neck pain. Respiratory: - cough, (-) bloody sputum, + shortness of breath, - wheezing. Cardiovascular: - ankle swelling, (-) chest pain. Lymphatic: (-) lymph node enlargement. Neurologic: (-) numbness, (-) tingling. Psychiatric: (-) anxiety, (-) depression   Current Medication: Outpatient Encounter Medications as of 04/13/2022  Medication Sig   aspirin 81 MG tablet Take 81 mg by mouth daily.   atorvastatin (LIPITOR) 80 MG tablet Take 1 tablet (80 mg total) by mouth daily. (Patient taking differently: Take 80 mg by mouth at bedtime.)   budesonide-formoterol (SYMBICORT) 160-4.5 MCG/ACT inhaler Inhale 1 puff into the lungs 2 (two) times daily.   Cholecalciferol (VITAMIN D) 2000  UNITS CAPS Take by mouth. VITAMIN D 3 2000 IU  ONE SOFTGEL DAILY  --OTC   clopidogrel (PLAVIX) 75 MG tablet Take 1 tablet (75 mg total) by mouth daily.   enalapril (VASOTEC) 20 MG tablet Take 1 tablet (20 mg total) by mouth daily.   ezetimibe (ZETIA) 10 MG tablet Take 1 tablet (10 mg total) by mouth daily.   OXYGEN Inhale into the lungs. At night   pantoprazole (PROTONIX) 40 MG tablet Take 1 tablet (40 mg total) by mouth daily. IN AM   phenytoin (DILANTIN) 100 MG ER capsule Take 4 capsules (400 mg total) by mouth at bedtime.   No facility-administered encounter medications on file as of 04/13/2022.    Surgical History: Past Surgical History:  Procedure Laterality Date   CAROTID SURGERY 2009 -LEFT     COLON POLYP EXCISION     COLON SURGERY     COLONOSCOPY WITH PROPOFOL N/A 04/18/2017   Procedure: COLONOSCOPY WITH PROPOFOL;  Surgeon: Manya Silvas, MD;  Location[facility] ENDOSCOPY;  Service: Endoscopy;  Laterality: N/A;   ESOPHAGOGASTRODUODENOSCOPY     IR GENERIC HISTORICAL  01/21/2016   IR RADIOLOGIST EVAL & MGMT 01/21/2016 Aletta Edouard, MD [facility] INTERV RAD   IR GENERIC HISTORICAL  10/22/2014   IR RADIOLOGIST EVAL & MGMT 10/22/2014 Aletta Edouard, MD [facility] INTERV RAD   IR RADIOLOGIST EVAL & MGMT  02/02/2017   IR RADIOLOGIST EVAL & MGMT  03/08/2018   percutaneous biopsy      Medical History: Past Medical History:  Diagnosis Date   Alcoholism ([place])    COPD (chronic obstructive pulmonary disease) (  [place])    SEVERE COPD -OCCASIONALLY USES OXYGEN AT NIGHT-DOES NOT USE INHALERS ON REGULAR BASIS   Elevated cholesterol    GERD (gastroesophageal reflux disease)    HOH (hard of hearing)    Hypertension    Lung cancer ([place])    LEFT UPPER LUNG CARCINOMA-NOT A CANDIDATE FOR SURGICAL RESECTION BECAUSE OF HIS COPD AND POOR CANDIDATE FOR RADIATION BECAUSE OF TRANSPORTATION PROBLEMS   Seizures ([place])    CHRONIC DILATIN - PT STATES NO SEIZURES IN PAST COUPLE OF YEARS; PT STATES HIS SEIZURES CAUSED  NUMBNESS OF ARMS AND HANDS AND NOT ABLE TO MOVE HIS ARMS   Stroke ([place])    2 TO 3 YRS AGO-AFFECTED HIS SPEECH--ABLE TO AMBULATE WITHOUT ASSIST AND DOES YARD WORK.  DECREASED HEARING IN BOTH EARS SINCE STROKE.   Vitamin D deficiency     Family History: Family History  Problem Relation Age of Onset   Colon polyps Sister     Social History: Social History   Socioeconomic History   Marital status: Single    Spouse name: Not on file   Number of children: Not on file   Years of education: Not on file   Highest education level: Not on file  Occupational History   Not on file  Tobacco Use   Smoking status: Former    Packs/day: 1.00    Years: 35.00    Total pack years: 35.00    Types: Cigarettes    Quit date: 12/22/2011    Years since quitting: 10.3   Smokeless tobacco: Never  Vaping Use   Vaping Use: Never used  Substance and Sexual Activity   Alcohol use: Yes    Comment: occosionally    Drug use: No   Sexual activity: Not on file  Other Topics Concern   Not on file  Social History Narrative   Not on file   Social Determinants of Health   Financial Resource Strain: Low Risk  (11/28/2020)   Overall Financial Resource Strain (CARDIA)    Difficulty of Paying Living Expenses: Not hard at all  Food Insecurity: Not on file  Transportation Needs: Not on file  Physical Activity: Not on file  Stress: Not on file  Social Connections: Not on file  Intimate Partner Violence: Not on file    Vital Signs: Blood pressure 111/64, pulse 71, temperature 98.2 F (36.8 C), resp. rate 16, height 5\' 7"  (1.702 m), weight 167 lb (75.8 kg), SpO2 98 %.  Examination: General Appearance: The patient is well-developed, well-nourished, and in no distress. Skin: Gross inspection of skin unremarkable. Head: normocephalic, no gross deformities. Eyes: no gross deformities noted. ENT: ears appear grossly normal no exudates. Neck: Supple. No thyromegaly. No LAD. Respiratory: few rhonchi  noted. Cardiovascular: Normal S1 and S2 without murmur or rub. Extremities: No cyanosis. pulses are equal. Neurologic: Alert and oriented. No involuntary movements.  LABS: Recent Results (from the past 2160 hour(s))  CBC w/Diff/Platelet     Status: Abnormal   Collection Time: 03/22/22 10:32 AM  Result Value Ref Range   WBC 6.2 3.4 - 10.8 x10E3/uL   RBC 4.13 (L) 4.14 - 5.80 x10E6/uL   Hemoglobin 12.4 (L) 13.0 - 17.7 g/dL   Hematocrit 35.8 (L) 37.5 - 51.0 %   MCV 87 79 - 97 fL   MCH 30.0 26.6 - 33.0 pg   MCHC 34.6 31.5 - 35.7 g/dL   RDW 15.6 (H) 11.6 - 15.4 %   Platelets 456 (H) 150 - 450 x10E3/uL  Neutrophils 57 Not Estab. %   Lymphs 29 Not Estab. %   Monocytes 12 Not Estab. %   Eos 1 Not Estab. %   Basos 1 Not Estab. %   Neutrophils Absolute 3.6 1.4 - 7.0 x10E3/uL   Lymphocytes Absolute 1.8 0.7 - 3.1 x10E3/uL   Monocytes Absolute 0.7 0.1 - 0.9 x10E3/uL   EOS (ABSOLUTE) 0.1 0.0 - 0.4 x10E3/uL   Basophils Absolute 0.1 0.0 - 0.2 x10E3/uL   Immature Granulocytes 0 Not Estab. %   Immature Grans (Abs) 0.0 0.0 - 0.1 x10E3/uL  Fe+TIBC+Fer     Status: Abnormal   Collection Time: 03/22/22 10:32 AM  Result Value Ref Range   Total Iron Binding Capacity 240 (L) 250 - 450 ug/dL   UIBC 118 111 - 343 ug/dL   Iron 122 38 - 169 ug/dL   Iron Saturation 51 15 - 55 %   Ferritin 92 30 - 400 ng/mL  SARS CORONAVIRUS 2 (TAT 6-24 HRS) Anterior Nasal Swab     Status: None   Collection Time: 04/08/22  9:53 AM   Specimen: Anterior Nasal Swab  Result Value Ref Range   SARS Coronavirus 2 NEGATIVE NEGATIVE    Comment: (NOTE) SARS-CoV-2 target nucleic acids are NOT DETECTED.  The SARS-CoV-2 RNA is generally detectable in upper and lower respiratory specimens during the acute phase of infection. Negative results do not preclude SARS-CoV-2 infection, do not rule out co-infections with other pathogens, and should not be used as the sole basis for treatment or other patient management  decisions. Negative results must be combined with clinical observations, patient history, and epidemiological information. The expected result is Negative.  Fact Sheet for Patients: SugarRoll.be  Fact Sheet for Healthcare Providers: https://www.woods-mathews.com/  This test is not yet approved or cleared by the [place] FDA and  has been authorized for detection and/or diagnosis of SARS-CoV-2 by FDA under an Emergency Use Authorization (EUA). This EUA will remain  in effect (meaning this test can be used) for the duration of the COVID-19 declaration under Se ction 564(b)(1) of the Act, 21 U.S.C. section 360bbb-3(b)(1), unless the authorization is terminated or revoked sooner.  Performed at [facility], [place] [address], [place], [place] [address]   Glucose, capillary     Status: None   Collection Time: 04/08/22 10:05 AM  Result Value Ref Range   Glucose-Capillary 97 70 - 99 mg/dL    Comment: Glucose reference range applies only to samples taken after fasting for at least 8 hours.    Radiology: No results found.  No results found.  NM PET Image Initial (PI) Skull Base To Thigh (F-18 FDG)  Result Date: 04/09/2022 CLINICAL DATA:  Subsequent treatment strategy for right upper lobe bronchus occlusion and consolidation. History of percutaneous thermal ablation of left upper lobe non-small cell lung cancer. EXAM: NUCLEAR MEDICINE PET SKULL BASE TO THIGH TECHNIQUE: 9.7 mCi F-18 FDG was injected intravenously. Full-ring PET imaging was performed from the skull base to thigh after the radiotracer. CT data was obtained and used for attenuation correction and anatomic localization. Fasting blood glucose: 97 mg/dl COMPARISON:  12/23/2021 chest CT.  08/14/2012 PET-CT. FINDINGS: Mediastinal blood pool activity: SUV max 1.9 Liver activity: SUV max NA NECK: No hypermetabolic lymph nodes in the neck. Incidental CT findings: Mucous retention cyst  versus polyp in the inferior left maxillary sinus. CHEST: Hypermetabolic central right upper lobe lung mass nearly occluding the right upper lobe bronchus, poorly delineated on the noncontrast CT images, measuring  approximately 3.8 x 2.9 cm (series 2/image 89) with max SUV 11.9, probably extending to within 2.0 cm of the carina and involving the bifurcation of the right mainstem bronchus. Postobstructive consolidation throughout the basilar right upper lobe with associated patchy hypermetabolism with max SUV 5.2. No evidence of significant FDG uptake at the site of the stable bandlike focus of consolidation in the left upper lobe with associated mild volume loss and distortion, compatible with benign post ablation change. No hypermetabolic left pulmonary findings. No enlarged or hypermetabolic axillary, mediastinal or hilar lymph nodes. Incidental CT findings: Coronary atherosclerosis. Atherosclerotic thoracic aorta with dilated 4.4 cm ascending thoracic aorta. Moderate centrilobular emphysema. ABDOMEN/PELVIS: No abnormal hypermetabolic activity within the liver, pancreas, adrenal glands, or spleen. No hypermetabolic lymph nodes in the abdomen or pelvis. Incidental CT findings: Coarsely calcified non hypermetabolic portacaval node presumably from prior granulomatous disease. Atherosclerotic nonaneurysmal abdominal aorta. Moderate right colonic diverticulosis. SKELETON: No focal hypermetabolic activity to suggest skeletal metastasis. Incidental CT findings: None. IMPRESSION: 1. Hypermetabolic central right upper lobe lung mass nearly occluding the right upper lobe bronchus, poorly delineated on the noncontrast CT images, measuring approximately 3.8 x 2.9 cm, compatible with primary bronchogenic malignancy. 2. Postobstructive pneumonia/atelectasis throughout the basilar right upper lobe. 3. No hypermetabolic metastatic thoracic adenopathy or distant metastatic disease. 4. Stable mild post ablation change in the left  upper lobe with no metabolic evidence of local tumor recurrence in this location. 5. Dilated 4.4 cm ascending thoracic aorta. Recommend annual imaging followup by CTA or MRA. This recommendation follows 2010 ACCF/AHA/AATS/ACR/ASA/SCA/SCAI/SIR/STS/SVM Guidelines for the Diagnosis and Management of Patients with Thoracic Aortic Disease. Circulation. 2010; 121: P536-R443. Aortic aneurysm NOS (ICD10-I71.9). 6. Chronic findings include: Aortic Atherosclerosis (ICD10-I70.0) and Emphysema (ICD10-J43.9). Coronary atherosclerosis. Moderate right colonic diverticulosis. Electronically Signed   By: Ilona Sorrel M.D.   On: 04/09/2022 17:04   DG Chest 2 View  Result Date: 03/19/2022 CLINICAL DATA:  Lung nodule.  Right lung atelectasis. EXAM: CHEST - 2 VIEW COMPARISON:  Chest CT, 12/23/2021.  Chest radiographs, 05/07/2012. FINDINGS: Cardiac silhouette is normal in size and configuration. No mediastinal or hilar masses. No evidence of adenopathy. There is discoid type opacity along the minor fissure consistent with atelectasis and stable compared to the recent prior CT. There is linear scarring or atelectasis in the left upper lobe, also stable the prior CT. Remainder of the lungs is clear. Lungs are hyperexpanded. No pleural effusion or pneumothorax. Skeletal structures are intact. IMPRESSION: 1. No change compared to the recent prior chest CT. 2. Linear/discoid type opacity along the minor fissure consistent with atelectasis. Small area of atelectasis or scarring in the left upper lobe. No new lung abnormalities. Electronically Signed   By: Lajean Manes M.D.   On: 03/19/2022 13:12      Assessment and Plan: Patient Active Problem List   Diagnosis Date Noted   History of lung cancer 04/21/2020   Seizures ([place]) 07/20/2019   Encounter for therapeutic drug level monitoring 07/20/2019   Need for vaccination against Streptococcus pneumoniae using pneumococcal conjugate vaccine 13 03/05/2019   GERD (gastroesophageal  reflux disease) 02/08/2018   Obstructive chronic bronchitis without exacerbation ([place]) 02/08/2018   Encounter for general adult medical examination with abnormal findings 12/18/2017   Atherosclerosis of aorta ([place]) 12/18/2017   Dysuria 12/18/2017   Cerebrovascular accident (CVA) ([place]) 11/01/2017   HOH (hard of hearing) 11/01/2017   Essential hypertension 11/01/2017   Hyperlipidemia 11/01/2017   Hx of adenomatous polyp of colon 12/16/2016   Squamous cell  lung cancer ([place])    Lung cancer, 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[place]) 05/07/2012    1. Mass of upper lobe of right lung As discussed patient PET scan seems to indicate there may be a better approach to do CT-guided needle aspiration of the mass.  We will have interventional radiology take a look and get him scheduled for the biopsy.  Spoke to him about stopping his anticoagulation medications at least 5 days prior to the scheduling of the procedure and then he can resume after 24 hours. - CT GUIDED NEEDLE PLACEMENT; Future  2. Obstructive chronic bronchitis without exacerbation ([place]) Significant COPD inhalers to be continued patient's last measured FEV1 was 34%.  He is supposed to be on Symbicort discussed with him the importance of compliance with recommended therapy.  In addition he uses oxygen but not regularly and he needs to be more compliant with oxygen therapy.  Based on COPD also it may be better to make an attempt at CT-guided biopsy first  General Counseling: I have discussed the findings of the evaluation and examination with Jeneen Rinks.  I have also discussed any further diagnostic evaluation thatmay be needed or ordered today. Dayyan verbalizes understanding of the findings of todays visit. We also reviewed his medications today and discussed drug interactions and side effects including but not limited excessive drowsiness and altered mental states. We also discussed that there is always a risk not just to him but also people around him. he has been  encouraged to call the office with any questions or concerns that should arise related to todays visit.  Orders Placed This Encounter  Procedures   CT LUNG MASS BIOPSY    Standing Status:   Future    Standing Expiration Date:   04/16/2023    Order Specific Question:   Lab orders requested (DO NOT place separate lab orders, these will be automatically ordered during procedure specimen collection):    Answer:   Cytology - Non Pap    Order Specific Question:   Lab orders requested (DO NOT place separate lab orders, these will be automatically ordered during procedure specimen collection):    Answer:   Surgical Pathology    Order Specific Question:   Reason for Exam (SYMPTOM  OR DIAGNOSIS REQUIRED)    Answer:   lung mass    Order Specific Question:   Preferred location?    Answer:   [facility]     Time spent: 50  I have personally obtained a history, examined the patient, evaluated laboratory and imaging results, formulated the assessment and plan and placed orders.    Allyne Gee, MD [facility] Pulmonary and Critical Care Sleep medicine

## 2022-04-13 NOTE — Patient Instructions (Signed)
Needle Biopsy     A needle biopsy is a procedure used to take cells out of the body for testing. It may be done to take small pieces of tissue or fluid (called samples) from muscles, bones, or organs, such as the liver or lungs. Your health care provider may suggest a needle biopsy to help diagnose a medical condition or to rule out a disease or condition. Tests done on the sample from your needle biopsy may help your health care provider learn more about: A mass or lump. A needle biopsy may show whether a mass or lump is a fluid-filled cyst, an infection, a tumor that is not cancerous (benign), or cancer. An infection. A needle biopsy can help your health care provider find out what germs are causing an infection so that the best medicines can be used to treat it. Inflammation. A needle biopsy may show what is causing inflammation and what types of cells are involved. Common types of needle biopsies include: Fine-needle aspiration. This type of needle biopsy uses a very thin, hollow needle and a syringe to remove cells from your body. Core needle biopsy. The needle used during a core needle biopsy is a wider, hollow tube that allows more cells to be removed. Tell your health care provider about: Any allergies you have. All medicines you are taking, including vitamins, herbs, eye drops, creams, and over-the-counter medicines. Any problems you or family members have had with anesthetic medicines. Any bleeding problems you have. Any surgeries you have had. Any medical conditions you have. Whether you are pregnant or may be pregnant. What are the risks? Generally, this is a safe procedure. However, problems may occur, including: Infection. Bleeding. Allergic reactions to medicines or dyes. Damage to nearby structures or organs. Pain at the puncture site that worsens or is not relieved by medicine. Swelling or drainage at the puncture site. What happens before the procedure? Medicines Ask  your health care provider about: Changing or stopping your regular medicines. This is especially important if you are taking diabetes medicines or blood thinners. Taking medicines such as aspirin and ibuprofen. These medicines can thin your blood. Do not take these medicines unless your health care provider tells you to take them. Taking over-the-counter medicines, vitamins, herbs, and supplements. Tests You may have: A blood or urine sample taken. Imaging tests, such as a CT scan or an ultrasound. General instructions Follow instructions from your health care provider about eating or drinking restrictions. Ask your health care provider: How your puncture site will be marked. What steps will be taken to help prevent infection. These steps may include: Removing hair at the puncture site. Washing skin with a germ-killing soap. Taking an antibiotic medicine. Do not use any products that contain nicotine or tobacco before the procedure. These products include cigarettes, chewing tobacco, and vaping devices, such as e-cigarettes. If you need help quitting, ask your health care provider. Plan to have a responsible adult take you home from the hospital or clinic. What happens during the procedure? An IV will be inserted into one of your veins. You may be given: A medicine to help you relax (sedative). A medicine to numb the area (local anesthetic). You will be positioned in a way that makes it safe for the health care provider to access the area where the needle will be inserted. A needle will be guided through your skin and into the area to be biopsied. Once enough cells or tissue has been collected, the biopsy sample will  be sent to a laboratory for testing. A bandage (dressing) may be put over the area where the needle was inserted. You may be asked to apply pressure to the dressing for several minutes to help stop the bleeding. The procedure may vary among health care providers and  hospitals. What happens after the procedure? Your blood pressure, heart rate, breathing rate, and blood oxygen level will be monitored until you leave the hospital or clinic. It is up to you to get the results of your procedure. Ask your health care provider, or the department that is doing the procedure, when your results will be ready. Summary A needle biopsy is a procedure used to get a sample of cells from your body for testing. Common types of needle biopsies include fine-needle aspiration and a core needle biopsy. Before your biopsy, you may have imaging tests, such as a CT scan or an ultrasound. It is up to you to get the results of your procedure. Ask your health care provider, or the department that is doing the procedure, when your results will be ready. This information is not intended to replace advice given to you by your health care provider. Make sure you discuss any questions you have with your health care provider. Document Revised: 01/28/2021 Document Reviewed: 01/28/2021 Elsevier Patient Education  [place].

## 2022-04-14 ENCOUNTER — Telehealth: Payer: Self-pay

## 2022-04-14 ENCOUNTER — Other Ambulatory Visit: Payer: Self-pay

## 2022-04-14 MED ORDER — NA SULFATE-K SULFATE-MG SULF 17.5-3.13-1.6 GM/177ML PO SOLN: 1.0000 | Freq: Once | ORAL | 0 refills | Status: AC

## 2022-04-14 NOTE — Telephone Encounter (Signed)
Returned patients call.  LVM for her to call back in regard to her husbands scheduled colonoscopy 04/19/22 with Dr. Vicente Males.  Thanks, [place], [place]

## 2022-04-14 NOTE — Progress Notes (Signed)
Patients sister returned phone call to reschedule her brothers colonoscopy.  Colonoscopy has been rescheduled to 07/05/2022.  Triage completed 03/02/22. Instructions updated and mailed.  New rx sent to pharmacy.

## 2022-04-15 ENCOUNTER — Other Ambulatory Visit: Payer: Self-pay

## 2022-04-19 ENCOUNTER — Telehealth: Payer: Self-pay

## 2022-04-19 NOTE — Telephone Encounter (Signed)
Lvm for [name] to return my call regarding biopsy-Toni

## 2022-04-22 ENCOUNTER — Encounter: Payer: Self-pay | Admitting: Internal Medicine

## 2022-04-22 NOTE — Progress Notes (Signed)
Patient on schedule for CT Lung biopsy 9/8, spoke with Linda/sister on phone with pre procedure instructions given. Made aware to hold ASA/Plavix with LD 9/2, NPO after MN prior to procedure and driver post procedure/recovery/discharge. Stated understanding.

## 2022-04-23 DIAGNOSIS — J449 Chronic obstructive pulmonary disease, unspecified: Secondary | ICD-10-CM | POA: Diagnosis not present

## 2022-04-27 ENCOUNTER — Ambulatory Visit: Payer: Medicare Other | Admitting: Internal Medicine

## 2022-04-27 ENCOUNTER — Telehealth: Payer: Self-pay | Admitting: Internal Medicine

## 2022-04-27 ENCOUNTER — Ambulatory Visit: Payer: Medicare Other

## 2022-04-27 DIAGNOSIS — R918 Other nonspecific abnormal finding of lung field: Secondary | ICD-10-CM

## 2022-04-27 NOTE — Telephone Encounter (Signed)
Patient needs consult for bronch only as Radiology will not do CT needle

## 2022-04-27 NOTE — Telephone Encounter (Signed)
Per radiologist request, lung biopsy cancelled. Patient referred to Dr. Duwayne Heck. I notified [name], patient's sister-Toni

## 2022-04-28 ENCOUNTER — Telehealth: Payer: Self-pay | Admitting: *Deleted

## 2022-04-28 NOTE — Telephone Encounter (Signed)
This patient is on the schedule to see Dr. Patsey Berthold for a consult tomorrow.  Nothing further needed.

## 2022-04-28 NOTE — Telephone Encounter (Signed)
We received an urgent referral on this patient from Dr. Humphrey Rolls for a mass of mass of upper lobe of right lung yesterday.  Patient had been scheduled for a CT bx, however, Melissa states this has been cancelled.  He had a CT chest on 12/23/21 and a PET scan on 04/08/22.  Melissa wants to know if this needs to be a new consult or is this going to be a patient that needs a bronch?  Do you know anything about this patient?  Please advise.  Thank you.

## 2022-04-28 NOTE — Telephone Encounter (Signed)
I do not know anything about this case.  He does need a bronchoscopy.  You may post with either me or Dr. Genia Harold.

## 2022-04-29 ENCOUNTER — Telehealth: Payer: Self-pay

## 2022-04-29 ENCOUNTER — Ambulatory Visit (INDEPENDENT_AMBULATORY_CARE_PROVIDER_SITE_OTHER): Payer: Medicare Other

## 2022-04-29 ENCOUNTER — Ambulatory Visit (INDEPENDENT_AMBULATORY_CARE_PROVIDER_SITE_OTHER): Payer: Medicare Other | Admitting: Pulmonary Disease

## 2022-04-29 ENCOUNTER — Encounter: Payer: Self-pay | Admitting: Pulmonary Disease

## 2022-04-29 VITALS — BP 124/70 | HR 90 | Temp 98.0°F | Ht 67.0 in | Wt 167.6 lb

## 2022-04-29 DIAGNOSIS — J9811 Atelectasis: Secondary | ICD-10-CM

## 2022-04-29 DIAGNOSIS — E538 Deficiency of other specified B group vitamins: Secondary | ICD-10-CM

## 2022-04-29 DIAGNOSIS — C3492 Malignant neoplasm of unspecified part of left bronchus or lung: Secondary | ICD-10-CM

## 2022-04-29 DIAGNOSIS — R918 Other nonspecific abnormal finding of lung field: Secondary | ICD-10-CM

## 2022-04-29 MED ORDER — CYANOCOBALAMIN 1000 MCG/ML IJ SOLN
1000.0000 ug | Freq: Once | INTRAMUSCULAR | Status: AC
  Administered 2022-04-29: 1000 ug via INTRAMUSCULAR

## 2022-04-29 NOTE — H&P (View-Only) (Signed)
Subjective:    Patient ID: Derek Webb, male    DOB: 05/01/1951, 71 y.o.   MRN: [id number] Patient Care Team: Carolynne Edouard as PCP - General (Physician Assistant) Alena Bills, [facility] as Pharmacist (Pharmacist)  Chief Complaint  Patient presents with   Follow-up    PET 04/09/2022. No current sx.    HPI Patient is a 71 year old former smoker (22 PY) with a history as noted below, who presents for evaluation for potential bronchoscopy with endobronchial ultrasound.  The patient is kindly referred by Dr. Devona Konig.  The patient does not endorse any symptoms with regards to abnormal findings on his chest imaging noted on 23 Dec 2021.  This showed abrupt and irregular cut off of the right upper lobe bronchus with associated right upper lobe atelectasis and postobstructive changes.  The patient was scheduled for bronchoscopy however he did not follow instructions of discontinuation of Plavix and the procedure had to be aborted.  Subsequently he was scheduled for a percutaneous needle biopsy however IR recommended the patient have bronchoscopy with endobronchial ultrasound so that staging can be done at the same time.  The patient presents here as a preoperative evaluation.  We discussed bronchoscopy and endobronchial ultrasound and that the procedure would have to be done under general anesthesia.  Patient presents today with his sister.  The procedure was explained to both of them in detail.  They had opportunity to ask questions and these were answered to their satisfaction.  The patient does endorses a dry cough without sputum production.  No hemoptysis.  He does not endorse shortness of breath.  He has had weight loss but no anorexia.  He cannot quantitate how much weight loss however patient's sister feels that this may be close to 20 pounds over 3 to 4 months time period.  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  He was deemed at that time not a surgical candidate and had transportation issues and could not show for radiation therapy consistently.  He had been followed up for 5 years without evidence of recurrence.  Review of Systems A 10 point review of systems was performed and it is as noted above otherwise negative.  Past Medical History:  Diagnosis Date   Alcoholism ([place])    COPD (chronic obstructive pulmonary disease) (HCC)    SEVERE COPD -OCCASIONALLY USES OXYGEN AT NIGHT-DOES NOT USE INHALERS ON REGULAR BASIS   Elevated cholesterol    GERD (gastroesophageal reflux disease)    HOH (hard of hearing)    Hypertension    Lung cancer ([place])    LEFT UPPER LUNG CARCINOMA-NOT A CANDIDATE FOR SURGICAL RESECTION BECAUSE OF HIS COPD AND POOR CANDIDATE FOR RADIATION BECAUSE OF TRANSPORTATION PROBLEMS   Seizures ([place])    CHRONIC DILATIN - PT STATES NO SEIZURES IN PAST COUPLE OF YEARS; PT STATES HIS SEIZURES CAUSED NUMBNESS OF ARMS AND HANDS AND NOT ABLE TO MOVE HIS ARMS   Stroke ([place])    2 TO 3 YRS AGO-AFFECTED HIS SPEECH--ABLE TO AMBULATE WITHOUT ASSIST AND DOES YARD WORK.  DECREASED HEARING IN BOTH EARS SINCE STROKE.   Vitamin D deficiency    Past Surgical History:  Procedure Laterality Date   CAROTID SURGERY 2009 -LEFT     COLON POLYP EXCISION     COLON SURGERY     COLONOSCOPY WITH PROPOFOL N/A 04/18/2017   Procedure: COLONOSCOPY WITH PROPOFOL;  Surgeon: Gaylyn Cheers  T, MD;  Location: [facility] ENDOSCOPY;  Service: Endoscopy;  Laterality: N/A;   ESOPHAGOGASTRODUODENOSCOPY     IR GENERIC HISTORICAL  01/21/2016   IR RADIOLOGIST EVAL & MGMT 01/21/2016 Aletta Edouard, MD [facility] INTERV RAD   IR GENERIC HISTORICAL  10/22/2014   IR RADIOLOGIST EVAL & MGMT 10/22/2014 Aletta Edouard, MD [facility] INTERV RAD   IR RADIOLOGIST EVAL & MGMT  02/02/2017   IR RADIOLOGIST EVAL & MGMT  03/08/2018   percutaneous biopsy     Patient Active Problem List   Diagnosis Date Noted   History of lung cancer  04/21/2020   Seizures ([place]) 07/20/2019   Encounter for therapeutic drug level monitoring 07/20/2019   Need for vaccination against Streptococcus pneumoniae using pneumococcal conjugate vaccine 13 03/05/2019   GERD (gastroesophageal reflux disease) 02/08/2018   Obstructive chronic bronchitis without exacerbation ([place]) 02/08/2018   Encounter for general adult medical examination with abnormal findings 12/18/2017   Atherosclerosis of aorta ([place]) 12/18/2017   Dysuria 12/18/2017   Cerebrovascular accident (CVA) ([place]) 11/01/2017   HOH (hard of hearing) 11/01/2017   Essential hypertension 11/01/2017   Hyperlipidemia 11/01/2017   Hx of adenomatous polyp of colon 12/16/2016   Squamous cell lung cancer ([place])    Lung cancer, 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[place]) 05/07/2012   Family History  Problem Relation Age of Onset   Colon polyps Sister    Social History   Tobacco Use   Smoking status: Former    Packs/day: 1.00    Years: 35.00    Total pack years: 35.00    Types: Cigarettes    Quit date: 12/22/2011    Years since quitting: 10.3   Smokeless tobacco: Never  Substance Use Topics   Alcohol use: Yes    Comment: occosionally    No Known Allergies  Current Meds  Medication Sig   aspirin 81 MG tablet Take 81 mg by mouth daily.   atorvastatin (LIPITOR) 80 MG tablet Take 1 tablet (80 mg total) by mouth daily. (Patient taking differently: Take 80 mg by mouth at bedtime.)   budesonide-formoterol (SYMBICORT) 160-4.5 MCG/ACT inhaler Inhale 1 puff into the lungs 2 (two) times daily.   Cholecalciferol (VITAMIN D) 2000 UNITS CAPS Take by mouth. VITAMIN D 3 2000 IU  ONE SOFTGEL DAILY  --OTC   clopidogrel (PLAVIX) 75 MG tablet Take 1 tablet (75 mg total) by mouth daily.   enalapril (VASOTEC) 20 MG tablet Take 1 tablet (20 mg total) by mouth daily.   ezetimibe (ZETIA) 10 MG tablet Take 1 tablet (10 mg total) by mouth daily.   OXYGEN Inhale into the lungs. At night   pantoprazole (PROTONIX) 40 MG tablet Take 1 tablet  (40 mg total) by mouth daily. IN AM   phenytoin (DILANTIN) 100 MG ER capsule Take 4 capsules (400 mg total) by mouth at bedtime.    Immunization History  Administered Date(s) Administered   Influenza Inj Mdck Quad With Preservative 07/21/2018   Influenza Split 05/06/2012   Influenza,inj,quad, With Preservative 05/21/2019   Influenza-Unspecified 06/13/2017   PFIZER(Purple Top)SARS-COV-2 Vaccination 03/11/2020, 03/31/2020, 10/07/2020   Pneumococcal Polysaccharide-23 04/20/2020       Objective:   Physical Exam BP 124/70 (BP Location: Left Arm, Cuff Size: Normal)   Pulse 90   Temp 98 F (36.7 C) (Temporal)   Ht 5\' 7"  (1.702 m)   Wt 167 lb 9.6 oz (76 kg)   SpO2 98%   BMI 26.25 kg/m  GENERAL: Well-developed, well-nourished gentleman, no acute distress. HEAD: Normocephalic, atraumatic.  EYES: Pupils  equal, round, reactive to light.  No scleral icterus.  MOUTH: Edentulous NECK: Supple. No thyromegaly. Trachea midline. No JVD.  No adenopathy. PULMONARY: Good air entry bilaterally.  No adventitious sounds. CARDIOVASCULAR: S1 and S2. Regular rate and rhythm.  No rubs, murmurs or gallops heard. ABDOMEN: Benign. MUSCULOSKELETAL: No joint deformity, no clubbing, no edema.  NEUROLOGIC: No overt focal deficit, no gait disturbance, speech is fluent. SKIN: Intact,warm,dry. PSYCH: Flat affect, normal behavior.  Representative images from the CT performed 23 Dec 2021 and PET/CT obtained 08 April 2022, these were independently reviewed:     Assessment & Plan:     ICD-10-CM   1. Mass of upper lobe of right lung  R91.8    Patient needs bronchoscopy with endobronchial ultrasound for diagnosis This is carcinoma until proven otherwise     2. Atelectasis of right lung  J98.11    Due to obstructive process on the right upper lobe    3. Squamous cell carcinoma of left lung (HCC)  C34.92    Left upper lobe SCCA Status post ablation 2013 No evidence of recurrence on LEFT     Benefits,  limitations and potential complications of the procedure were discussed with the patient/family.  Complications from bronchoscopy are rare and most often minor, but if they occur they may include breathing difficulty, vocal cord spasm, hoarseness, slight fever, vomiting, dizziness, bronchospasm, infection, low blood oxygen, bleeding from biopsy site, or an allergic reaction to medications.  It is uncommon for patients to experience other more serious complications for example: Collapsed lung requiring chest tube placement, respiratory failure, heart attack and/or cardiac arrhythmia.  Patient agrees to proceed.  Patient will have bronchoscopy with endobronchial ultrasound under general anesthesia on 12 May 2022 at 12:30 PM.  We will see the patient in follow-up in 4 to 6 weeks time for postprocedure visit only.  Patient understands that he needs to discontinue Plavix and aspirin products 5 days prior to the procedure.   Renold Don, MD Advanced Bronchoscopy PCCM [facility]-    *This note was dictated using voice recognition software/Dragon.  Despite best efforts to proofread, errors can occur which can change the meaning. Any transcriptional errors that result from this process are unintentional and may not be fully corrected at the time of dictation.

## 2022-04-29 NOTE — Patient Instructions (Signed)
It was a pleasure meeting you today.  We have schedule you for a biopsy procedure on 20 September in the same-day surgery area of the main hospital building at [facility] (medical mall).  You will be receiving calls from the preadmission clinic nurses giving you more information.  You will need a COVID test prior to the procedure and they will call you with the time and date for that.  We discussed that you will need a driver the day of your procedure.  Your procedure will require general anesthesia and you will not be able to drive after the procedure.  The procedure we are doing is a bronchoscopy with endobronchial ultrasound meaning that is a look at your bronchial tubes and a look at your lymph nodes with ultrasound and we will take samples as needed (biopsies).  You should have nothing to eat or drink on midnight the night prior to the procedure.  You will need to stop your Plavix and any aspirin products 5 days prior to the procedure.  We will see you in follow-up in 4 to 6 weeks time, this is a regular follow-up appointment after procedures.  We will however stay in contact with you and let you know the results of your biopsies as these become available.  If we determine that you need any other follow-up for treatment after the biopsies are back we will make those arrangements for you.

## 2022-04-29 NOTE — Telephone Encounter (Signed)
Patient scheduled for EBUS 05/12/2022 at 12;30. [id number], [place] [id number] mass  Rodena Piety, please see bronch info. Thanks

## 2022-04-29 NOTE — Progress Notes (Signed)
Subjective:    Patient ID: Nicoletta Ba, male    DOB: Oct 03, 1950, 71 y.o.   MRN: [id number] Patient Care Team: Alan Ripper as PCP - General (Physician Assistant) [name], [facility] as Pharmacist (Pharmacist)  Chief Complaint  Patient presents with  . Follow-up    PET 04/09/2022. No current sx.    HPI Patient is a 71 year old former smoker (35 PY) with a history as noted below, who presents for evaluation for bronchoscopy with endobronchial ultrasound.  The patient is kindly referred by Dr. Freda Munro.   Review of Systems A 10 point review of systems was performed and it is as noted above otherwise negative.  Past Medical History:  Diagnosis Date  . Alcoholism (HCC)   . COPD (chronic obstructive pulmonary disease) (HCC)    SEVERE COPD -OCCASIONALLY USES OXYGEN AT NIGHT-DOES NOT USE INHALERS ON REGULAR BASIS  . Elevated cholesterol   . GERD (gastroesophageal reflux disease)   . HOH (hard of hearing)   . Hypertension   . Lung cancer (HCC)    LEFT UPPER LUNG CARCINOMA-NOT A CANDIDATE FOR SURGICAL RESECTION BECAUSE OF HIS COPD AND POOR CANDIDATE FOR RADIATION BECAUSE OF TRANSPORTATION PROBLEMS  . Seizures (HCC)    CHRONIC DILATIN - PT STATES NO SEIZURES IN PAST COUPLE OF YEARS; PT STATES HIS SEIZURES CAUSED NUMBNESS OF ARMS AND HANDS AND NOT ABLE TO MOVE HIS ARMS  . Stroke (HCC)    2 TO 3 YRS AGO-AFFECTED HIS SPEECH--ABLE TO AMBULATE WITHOUT ASSIST AND DOES YARD WORK.  DECREASED HEARING IN BOTH EARS SINCE STROKE.  Marland Kitchen Vitamin D deficiency    Past Surgical History:  Procedure Laterality Date  . CAROTID SURGERY 2009 -LEFT    . COLON POLYP EXCISION    . COLON SURGERY    . COLONOSCOPY WITH PROPOFOL N/A 04/18/2017   Procedure: COLONOSCOPY WITH PROPOFOL;  Surgeon: Scot Jun, MD;  Location: [facility] ENDOSCOPY;  Service: Endoscopy;  Laterality: N/A;  . ESOPHAGOGASTRODUODENOSCOPY    . IR GENERIC HISTORICAL  01/21/2016   IR RADIOLOGIST EVAL & MGMT 01/21/2016 Irish Lack, MD [facility] INTERV RAD  . IR GENERIC HISTORICAL  10/22/2014   IR RADIOLOGIST EVAL & MGMT 10/22/2014 Irish Lack, MD [facility] INTERV RAD  . IR RADIOLOGIST EVAL & MGMT  02/02/2017  . IR RADIOLOGIST EVAL & MGMT  03/08/2018  . percutaneous biopsy     Patient Active Problem List   Diagnosis Date Noted  . History of lung cancer 04/21/2020  . Seizures (HCC) 07/20/2019  . Encounter for therapeutic drug level monitoring 07/20/2019  . Need for vaccination against Streptococcus pneumoniae using pneumococcal conjugate vaccine 13 03/05/2019  . GERD (gastroesophageal reflux disease) 02/08/2018  . Obstructive chronic bronchitis without exacerbation (HCC) 02/08/2018  . Encounter for general adult medical examination with abnormal findings 12/18/2017  . Atherosclerosis of aorta (HCC) 12/18/2017  . Dysuria 12/18/2017  . Cerebrovascular accident (CVA) (HCC) 11/01/2017  . HOH (hard of hearing) 11/01/2017  . Essential hypertension 11/01/2017  . Hyperlipidemia 11/01/2017  . Hx of adenomatous polyp of colon 12/16/2016  . Squamous cell lung cancer (HCC)   . Lung cancer, 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HCC) 05/07/2012   Family History  Problem Relation Age of Onset  . Colon polyps Sister    Social History   Tobacco Use  . Smoking status: Former    Packs/day: 1.00    Years: 35.00    Total pack years: 35.00    Types: Cigarettes    Quit date: 12/22/2011  Years since quitting: 10.3  . Smokeless tobacco: Never  Substance Use Topics  . Alcohol use: Yes    Comment: occosionally    No Known Allergies  Current Meds  Medication Sig  . aspirin 81 MG tablet Take 81 mg by mouth daily.  Marland Kitchen atorvastatin (LIPITOR) 80 MG tablet Take 1 tablet (80 mg total) by mouth daily. (Patient taking differently: Take 80 mg by mouth at bedtime.)  . budesonide-formoterol (SYMBICORT) 160-4.5 MCG/ACT inhaler Inhale 1 puff into the lungs 2 (two) times daily.  . Cholecalciferol (VITAMIN D) 2000 UNITS CAPS Take by mouth. VITAMIN D 3 2000 IU   ONE SOFTGEL DAILY  --OTC  . clopidogrel (PLAVIX) 75 MG tablet Take 1 tablet (75 mg total) by mouth daily.  . enalapril (VASOTEC) 20 MG tablet Take 1 tablet (20 mg total) by mouth daily.  Marland Kitchen ezetimibe (ZETIA) 10 MG tablet Take 1 tablet (10 mg total) by mouth daily.  . OXYGEN Inhale into the lungs. At night  . pantoprazole (PROTONIX) 40 MG tablet Take 1 tablet (40 mg total) by mouth daily. IN AM  . phenytoin (DILANTIN) 100 MG ER capsule Take 4 capsules (400 mg total) by mouth at bedtime.    Immunization History  Administered Date(s) Administered  . Influenza Inj Mdck Quad With Preservative 07/21/2018  . Influenza Split 05/06/2012  . Influenza,inj,quad, With Preservative 05/21/2019  . Influenza-Unspecified 06/13/2017  . PFIZER(Purple Top)SARS-COV-2 Vaccination 03/11/2020, 03/31/2020, 10/07/2020  . Pneumococcal Polysaccharide-23 04/20/2020       Objective:   Physical Exam BP 124/70 (BP Location: Left Arm, Cuff Size: Normal)   Pulse 90   Temp 98 F (36.7 C) (Temporal)   Ht 5\' 7"  (1.702 m)   Wt 167 lb 9.6 oz (76 kg)   SpO2 98%   BMI 26.25 kg/m  GENERAL: Well-developed, well-nourished gentleman, no acute distress. HEAD: Normocephalic, atraumatic.  EYES: Pupils equal, round, reactive to light.  No scleral icterus.  MOUTH: Edentulous NECK: Supple. No thyromegaly. Trachea midline. No JVD.  No adenopathy. PULMONARY: Good air entry bilaterally.  No adventitious sounds. CARDIOVASCULAR: S1 and S2. Regular rate and rhythm.  ABDOMEN: MUSCULOSKELETAL: No joint deformity, no clubbing, no edema.  NEUROLOGIC:  SKIN: Intact,warm,dry. PSYCH:       Assessment & Plan:

## 2022-04-30 ENCOUNTER — Ambulatory Visit
Admission: RE | Admit: 2022-04-30 | Discharge: 2022-04-30 | Disposition: A | Discharge status: Home / Self Care | Payer: Medicare Other | Source: Ambulatory Visit | Attending: Internal Medicine | Admitting: Internal Medicine

## 2022-04-30 NOTE — Telephone Encounter (Signed)
For the codes [phone], [place] Not Required Refer # [id number]

## 2022-04-30 NOTE — Telephone Encounter (Signed)
Phone pre admit visit 05/05/2022 between 1-5 and covid test 05/10/2022 at 10:00. Patient's sister, Linda(DPR) is aware and voiced her understanding.  Nothing further needed.

## 2022-05-03 ENCOUNTER — Encounter: Payer: Self-pay | Admitting: Pulmonary Disease

## 2022-05-05 ENCOUNTER — Encounter
Admission: RE | Admit: 2022-05-05 | Discharge: 2022-05-05 | Disposition: A | Discharge status: Home / Self Care | Payer: Medicare Other | Source: Ambulatory Visit | Attending: Pulmonary Disease | Admitting: Pulmonary Disease

## 2022-05-05 DIAGNOSIS — Z01812 Encounter for preprocedural laboratory examination: Secondary | ICD-10-CM

## 2022-05-05 NOTE — Patient Instructions (Addendum)
Your procedure is scheduled on: 05/12/22 - Wednesday Report to the Registration Desk on the 1st floor of the [facility]. To find out your arrival time, please call [phone] between 1PM - 3PM on: 05/11/22 - Tuesday If your arrival time is 6:00 am, do not arrive prior to that time as the [facility] entrance doors do not open until 6:00 am.  REMEMBER: Instructions that are not followed completely may result in serious medical risk, up to and including death; or upon the discretion of your surgeon and anesthesiologist your surgery may need to be rescheduled.  Do not eat food or drink any fluids after midnight the night before surgery.  No gum chewing, lozengers or hard candies.  TAKE THESE MEDICATIONS THE MORNING OF SURGERY WITH A SIP OF WATER:  1. ezetimibe (ZETIA) 10 MG tablet 2. pantoprazole (PROTONIX) 40 MG tablet, (take one the night before and one on the morning of surgery - helps to prevent nausea after surgery.) 3. budesonide-formoterol (SYMBICORT)   Stop taking Plavix and Aspirin beginning 05/07/22, may resume taking with doctor order.  One week prior to surgery: Stop Anti-inflammatories (NSAIDS) such as Advil, Aleve, Ibuprofen, Motrin, Naproxen, Naprosyn and Aspirin based products such as Excedrin, Goodys Powder, BC Powder.  Stop ANY OVER THE COUNTER supplements until after surgery.  You may however, continue to take Tylenol if needed for pain up until the day of surgery.  No Alcohol for 24 hours before or after surgery.  No Smoking including e-cigarettes for 24 hours prior to surgery.  No chewable tobacco products for at least 6 hours prior to surgery.  No nicotine patches on the day of surgery.  Do not use any "recreational" drugs for at least a week prior to your surgery.  Please be advised that the combination of cocaine and anesthesia may have negative outcomes, up to and including death. If you test positive for cocaine, your surgery will be cancelled.  On  the morning of surgery brush your teeth with toothpaste and water, you may rinse your mouth with mouthwash if you wish. Do not swallow any toothpaste or mouthwash.  Use CHG Soap or wipes as directed on instruction sheet.  Do not wear jewelry, make-up, hairpins, clips or nail polish.  Do not wear lotions, powders, or perfumes.   Do not shave body from the neck down 48 hours prior to surgery just in case you cut yourself which could leave a site for infection.  Also, freshly shaved skin may become irritated if using the CHG soap.  Contact lenses, hearing aids and dentures may not be worn into surgery.  Do not bring valuables to the hospital. [facility] is not responsible for any missing/lost belongings or valuables.   Notify your doctor if there is any change in your medical condition (cold, fever, infection).  Wear comfortable clothing (specific to your surgery type) to the hospital.  After surgery, you can help prevent lung complications by doing breathing exercises.  Take deep breaths and cough every 1-2 hours. Your doctor may order a device called an Incentive Spirometer to help you take deep breaths. When coughing or sneezing, hold a pillow firmly against your incision with both hands. This is called "splinting." Doing this helps protect your incision. It also decreases belly discomfort.  If you are being admitted to the hospital overnight, leave your suitcase in the car. After surgery it may be brought to your room.  If you are being discharged the day of surgery, you will not  be allowed to drive home. You will need a responsible adult (18 years or older) to drive you home and stay with you that night.   If you are taking public transportation, you will need to have a responsible adult (18 years or older) with you. Please confirm with your physician that it is acceptable to use public transportation.   Please call the [facility] at [phone] if you have  any questions about these instructions.  Surgery Visitation Policy:  Patients undergoing a surgery or procedure may have two family members or support persons with them as long as the person is not COVID-19 positive or experiencing its symptoms.   Inpatient Visitation:    Visiting hours are 7 a.m. to 8 p.m. Up to four visitors are allowed at one time in a patient room, including children. The visitors may rotate out with other people during the day. One designated support person (adult) may remain overnight.

## 2022-05-05 NOTE — Pre-Procedure Instructions (Signed)
Pre admission appointment completed with patient and his sister [name], reviewed medications, HX and allergies. She reports no changes. This Probation officer verified that patient refuses Blood products even in an emergent situation, patient voiced that he will receive blood products if they are needed. Stop date for Plavix and aspirin verified with patient and his sister, they are aware that patient will need to come in for his Covid test on 05/10/22 and will need Labs also, they are aware that updated pre op instructions are available for review on [facility] and to call with any questions or concerns. They both voiced understanding and have no concerns or questions at this time.

## 2022-05-10 ENCOUNTER — Encounter
Admission: RE | Admit: 2022-05-10 | Discharge: 2022-05-10 | Disposition: A | Discharge status: Home / Self Care | Payer: Medicare Other | Source: Ambulatory Visit | Attending: Pulmonary Disease | Admitting: Pulmonary Disease

## 2022-05-10 DIAGNOSIS — Z20822 Contact with and (suspected) exposure to covid-19: Secondary | ICD-10-CM

## 2022-05-10 DIAGNOSIS — Z01812 Encounter for preprocedural laboratory examination: Secondary | ICD-10-CM | POA: Insufficient documentation

## 2022-05-10 LAB — BASIC METABOLIC PANEL
Anion gap: 8 (ref 5–15)
BUN: 7 mg/dL — ABNORMAL LOW (ref 8–23)
CO2: 29 mmol/L (ref 22–32)
Calcium: 9.5 mg/dL (ref 8.9–10.3)
Chloride: 103 mmol/L (ref 98–111)
Creatinine, Ser: 0.53 mg/dL — ABNORMAL LOW (ref 0.61–1.24)
GFR, Estimated: >60 mL/min (ref >60)
Glucose, Bld: 86 mg/dL (ref 70–99)
Potassium: 4.1 mmol/L (ref 3.5–5.1)
Sodium: 140 mmol/L (ref 135–145)

## 2022-05-10 LAB — SARS CORONAVIRUS 2 (TAT 6-24 HRS): SARS Coronavirus 2: NEGATIVE

## 2022-05-12 ENCOUNTER — Ambulatory Visit: Payer: Medicare Other | Admitting: Anesthesiology

## 2022-05-12 ENCOUNTER — Other Ambulatory Visit: Payer: Self-pay

## 2022-05-12 ENCOUNTER — Telehealth: Payer: Self-pay | Admitting: Pulmonary Disease

## 2022-05-12 ENCOUNTER — Ambulatory Visit
Admission: RE | Admit: 2022-05-12 | Discharge: 2022-05-12 | Disposition: A | Discharge status: Home / Self Care | Payer: Medicare Other | Attending: Pulmonary Disease | Admitting: Pulmonary Disease

## 2022-05-12 ENCOUNTER — Encounter: Payer: Self-pay | Admitting: Pulmonary Disease

## 2022-05-12 ENCOUNTER — Ambulatory Visit: Payer: Medicare Other

## 2022-05-12 ENCOUNTER — Encounter
Admission: RE | Disposition: A | Discharge status: Home / Self Care | Payer: Self-pay | Source: Home / Self Care | Attending: Pulmonary Disease

## 2022-05-12 DIAGNOSIS — C3411 Malignant neoplasm of upper lobe, right bronchus or lung: Secondary | ICD-10-CM | POA: Diagnosis not present

## 2022-05-12 DIAGNOSIS — K219 Gastro-esophageal reflux disease without esophagitis: Secondary | ICD-10-CM | POA: Insufficient documentation

## 2022-05-12 DIAGNOSIS — I10 Essential (primary) hypertension: Secondary | ICD-10-CM | POA: Diagnosis not present

## 2022-05-12 DIAGNOSIS — I639 Cerebral infarction, unspecified: Secondary | ICD-10-CM | POA: Diagnosis not present

## 2022-05-12 DIAGNOSIS — J449 Chronic obstructive pulmonary disease, unspecified: Secondary | ICD-10-CM | POA: Insufficient documentation

## 2022-05-12 DIAGNOSIS — Z87891 Personal history of nicotine dependence: Secondary | ICD-10-CM | POA: Diagnosis not present

## 2022-05-12 DIAGNOSIS — J9811 Atelectasis: Secondary | ICD-10-CM | POA: Diagnosis present

## 2022-05-12 DIAGNOSIS — R569 Unspecified convulsions: Secondary | ICD-10-CM | POA: Insufficient documentation

## 2022-05-12 DIAGNOSIS — R918 Other nonspecific abnormal finding of lung field: Secondary | ICD-10-CM

## 2022-05-12 HISTORY — PX: VIDEO BRONCHOSCOPY WITH ENDOBRONCHIAL ULTRASOUND: SHX6177

## 2022-05-12 SURGERY — BRONCHOSCOPY, WITH EBUS
Anesthesia: General | Laterality: Right

## 2022-05-12 MED ORDER — FENTANYL CITRATE (PF) 100 MCG/2ML IJ SOLN
INTRAMUSCULAR | Status: DC | PRN
  Administered 2022-05-12 (×2): 50 ug via INTRAVENOUS

## 2022-05-12 MED ORDER — GLYCOPYRROLATE 0.2 MG/ML IJ SOLN
INTRAMUSCULAR | Status: AC
  Filled 2022-05-12: qty 1

## 2022-05-12 MED ORDER — LACTATED RINGERS IV SOLN: INTRAVENOUS | Status: DC

## 2022-05-12 MED ORDER — GLYCOPYRROLATE 0.2 MG/ML IJ SOLN
INTRAMUSCULAR | Status: DC | PRN
  Administered 2022-05-12: .2 mg via INTRAVENOUS

## 2022-05-12 MED ORDER — HYDROCODONE-ACETAMINOPHEN 7.5-325 MG PO TABS: 1.0000 | ORAL_TABLET | Freq: Once | ORAL | Status: DC | PRN

## 2022-05-12 MED ORDER — CHLORHEXIDINE GLUCONATE 0.12 % MT SOLN
OROMUCOSAL | Status: AC
  Administered 2022-05-12: 15 mL via OROMUCOSAL
  Filled 2022-05-12: qty 15

## 2022-05-12 MED ORDER — ONDANSETRON HCL 4 MG/2ML IJ SOLN
INTRAMUSCULAR | Status: AC
  Filled 2022-05-12: qty 2

## 2022-05-12 MED ORDER — ONDANSETRON HCL 4 MG/2ML IJ SOLN
INTRAMUSCULAR | Status: DC | PRN
  Administered 2022-05-12: 4 mg via INTRAVENOUS

## 2022-05-12 MED ORDER — LIDOCAINE HCL (CARDIAC) PF 100 MG/5ML IV SOSY
PREFILLED_SYRINGE | INTRAVENOUS | Status: DC | PRN
  Administered 2022-05-12: 100 mg via INTRAVENOUS

## 2022-05-12 MED ORDER — ORAL CARE MOUTH RINSE: 15.0000 mL | Freq: Once | OROMUCOSAL | Status: AC

## 2022-05-12 MED ORDER — DROPERIDOL 2.5 MG/ML IJ SOLN: 0.6250 mg | Freq: Once | INTRAMUSCULAR | Status: DC | PRN

## 2022-05-12 MED ORDER — DEXAMETHASONE SODIUM PHOSPHATE 10 MG/ML IJ SOLN
INTRAMUSCULAR | Status: DC | PRN
  Administered 2022-05-12: 10 mg via INTRAVENOUS

## 2022-05-12 MED ORDER — CHLORHEXIDINE GLUCONATE 0.12 % MT SOLN: 15.0000 mL | Freq: Once | OROMUCOSAL | Status: AC

## 2022-05-12 MED ORDER — ROCURONIUM BROMIDE 100 MG/10ML IV SOLN
INTRAVENOUS | Status: DC | PRN
  Administered 2022-05-12: 40 mg via INTRAVENOUS
  Administered 2022-05-12: 20 mg via INTRAVENOUS

## 2022-05-12 MED ORDER — ONDANSETRON HCL 4 MG/2ML IJ SOLN: 4.0000 mg | Freq: Once | INTRAMUSCULAR | Status: DC | PRN

## 2022-05-12 MED ORDER — FENTANYL CITRATE (PF) 100 MCG/2ML IJ SOLN: 25.0000 ug | INTRAMUSCULAR | Status: DC | PRN

## 2022-05-12 MED ORDER — MEPERIDINE HCL 25 MG/ML IJ SOLN: 6.2500 mg | INTRAMUSCULAR | Status: DC | PRN

## 2022-05-12 MED ORDER — DEXAMETHASONE SODIUM PHOSPHATE 10 MG/ML IJ SOLN
INTRAMUSCULAR | Status: AC
  Filled 2022-05-12: qty 1

## 2022-05-12 MED ORDER — SUGAMMADEX SODIUM 200 MG/2ML IV SOLN
INTRAVENOUS | Status: DC | PRN
  Administered 2022-05-12: 200 mg via INTRAVENOUS

## 2022-05-12 MED ORDER — PROPOFOL 10 MG/ML IV BOLUS
INTRAVENOUS | Status: DC | PRN
  Administered 2022-05-12: 130 mg via INTRAVENOUS

## 2022-05-12 MED ORDER — FENTANYL CITRATE (PF) 100 MCG/2ML IJ SOLN
INTRAMUSCULAR | Status: AC
  Filled 2022-05-12: qty 2

## 2022-05-12 NOTE — Op Note (Signed)
PROCEDURES:  BRONCHOSCOPY WITH BIOPSIES ENDOBRONCHIAL ULTRASOUND with TBNA   PROCEDURE DATE: 05/12/2022  TIME: 1300 hrs. NAME:  Derek Webb  DOB:September 19, 1950  MRN: [id number] LOC:  ARPO/None    HOSP DAY: N/A  Indications/Preliminary Diagnosis:Adenopathy  Consent: (Place X beside choice/s below)  The benefits, risks and possible complications of the procedure were        explained to:  _X__ patient  _X__ patient's family  ___ other:___________  who verbalized understanding and gave:  ___ verbal  ___ written  _X__ verbal and written  ___ telephone  ___ other:________ consent.      Unable to obtain consent; procedure performed on emergent basis.     Other:    Benefits, limitations and potential complications of the procedure were discussed with the patient/family.  Complications from bronchoscopy are rare and most often minor, but if they occur they may include breathing difficulty, vocal cord spasm, hoarseness, slight fever, vomiting, dizziness, bronchospasm, infection, low blood oxygen, bleeding from biopsy site, or an allergic reaction to medications.  It is uncommon for patients to experience other more serious complications for example: Collapsed lung requiring chest tube placement, respiratory failure, heart attack and/or cardiac arrhythmia.  Patient and family agree to proceed.    Anesthesia type: General endotracheal  Surgeon: Renold Don, MD Assistant/Scrub: Liborio Nixon, RRT Anesthesiologist/CRNA: Tonny Bollman, MD/Janice Rise Patience, CRNA Cytotechnology: [facility]   PROCEDURE DETAILS: Patient was taken to Procedure Room 2 (Bronchoscopy Suite) in the OR area.  Appropriate Timeout performed and correct patient, name, ID and laterality confirmed.  Patient was inducted under general anesthesia and intubated with an 8.0 ET tube without difficulty.  Once the patient was under adequate general anesthesia a Portex adapter was placed in the ET tube flange.  Through the Portex  adapter the Olympus video therapeutic bronchoscope was then advanced.  The visible distal trachea was normal, carina appeared full.  Examination of the right tracheobronchial tree showed total occlusion of the right upper lobe by friable mass that appeared necrotic.  The right middle lobe, lower lobe bronchi were free of endobronchial lesions or any other abnormality.  At this point bronchoscope was brought to the left mainstem and examination revealed no endobronchial lesions on the left tracheobronchial tree.  There were significant inspissated secretions on the left lower lobe and these were lavaged till clear.  At this point attention was brought back to the RIGHT upper lobe.  The friable mass was blanched with 3 mL of 1-10,000 solution of epinephrine.  At this point a total of 14 bronchial biopsies were obtained with ConMed Precisor forceps.  The first sample was subjected to ROSE this showed atypical cells and necrotic debris.  The remainder samples were directed to areas that appeared to be less necrotic.  After completing this, 2 passes were performed with cytology brushes.  First brush pass was subjected to ROSE, this also showed atypical cells and some necrotic debris.  This was followed by bronchoalveolar lavage to the right upper lobe, instilling 20 mL of saline and obtaining 10 mL of aliquot for cytology.  Patient required an additional 3 mL of 1-10,000 solution of epinephrine for hemostasis.  After completing this, and ensuring adequate hemostasis, the bronchoscope was switched to an endobronchial ultrasound scope (EBUS scope) and the mediastinum was examined.  There was significant bulky adenopathy on the right subcarinal space, significant adenopathy on the right hilum.  We proceeded to sample the RIGHT subcarinal space.  ROSE revealed atypia and necrosis.  A total  of 1 pass was performed as there was some significant heme and the visualization was obscured.  Further passes were not able to be  performed safely.  The material was placed in preservative for further analysis.  Having completed this, the EBUS scope was retrieved and again replaced for the therapeutic video bronchoscope.  The airway was then examined thoroughly to ensure adequate hemostasis.  After examination and inspection and noting excellent hemostasis, the patient received 9 mL of 1% lidocaine via bronchial lavage prior to retrieving the bronchoscope.  At this point the procedure was terminated and the patient was allowed to emerge from general anesthesia.  Patient was extubated in the procedure room and transported to the PACU in satisfactory condition.  The patient did not have any complaints of chest pain or shortness of breath postprocedure.  Postprocedure examination shows symmetrical lung sounds.  Postprocedure chest x-ray showed no pneumothorax.  Patient tolerated the procedure well.   SPECIMENS (Sites): (Place X beside choice below)  Specimens Description   No Specimens Obtained    X TBNA X 1  X Lavage RUL 10 ml aliquot  X Biopsies X 14 RUL   Fine Needle Aspirates   X Brushings X 2    Sputum    FINDINGS:  Total occlusion of the right upper lobe by mass which appears necrotic:   Subcarinal lymph node 3 cm :   Needle in subcarinal mass/lymph node:   ESTIMATED BLOOD LOSS: Less than 10 mL   COMPLICATIONS/RESOLUTION: none, post procedure chest x-ray showed no pneumothorax, atelectatic changes due to central obstructive process:    IMPRESSION:POST-PROCEDURE DX:  Right upper lobe mass completely occluding the right upper lobe bronchus.  Mass is friable and necrotic. Significant bulky subcarinal adenopathy   RECOMMENDATION/PLAN:  Await pathology reports Patient will be referred to the [facility], discussed with Dr. Devona Konig via secure chat.     Renold Don, MD Advanced Bronchoscopy PCCM [facility]    *This note was dictated using voice recognition  software/Dragon.  Despite best efforts to proofread, errors can occur which can change the meaning.  Any change was purely unintentional.

## 2022-05-12 NOTE — Interval H&P Note (Signed)
Derek Webb has presented today for surgery, with the diagnosis of RIGHT LUNG MASS and MEDIASTINAL ADENOPATHY.  The various methods of treatment have been discussed with the patient and family. After consideration of risks, benefits and other options for treatment, the patient has consented to  Procedure(s): BRONCHOSCOPY WITH ENDOBRONCHIAL ULTRASOUND AND BIOPSIES AS NECESSARY-attention RIGHT as a surgical intervention.  The patient's history has been reviewed, patient examined, no change in status, stable for surgery.  I have reviewed the patient's chart and labs.  Questions were answered to the patient's satisfaction.  Benefits, limitations and potential complications of the procedure were discussed with the patient/family.  Complications from bronchoscopy are rare and most often minor, but if they occur they may include breathing difficulty, vocal cord spasm, hoarseness, slight fever, vomiting, dizziness, bronchospasm, infection, low blood oxygen, bleeding from biopsy site, or an allergic reaction to medications.  It is uncommon for patients to experience other more serious complications for example: Collapsed lung requiring chest tube placement, respiratory failure, heart attack and/or cardiac arrhythmia.  Patient agrees to proceed.   Renold Don, MD Advanced Bronchoscopy PCCM [facility]    *This note was dictated using voice recognition software/Dragon.  Despite best efforts to proofread, errors can occur which can change the meaning. Any transcriptional errors that result from this process are unintentional and may not be fully corrected at the time of dictation.

## 2022-05-12 NOTE — Anesthesia Preprocedure Evaluation (Signed)
Anesthesia Evaluation  Patient identified by MRN, date of birth, ID band Patient awake    Reviewed: Allergy & Precautions, NPO status , Patient's Chart, lab work & pertinent test results  History of Anesthesia Complications Negative for: history of anesthetic complications  Airway Mallampati: I  TM Distance: >3 FB Neck ROM: Full    Dental no notable dental hx. (+) Edentulous Lower, Edentulous Upper   Pulmonary COPD,  COPD inhaler, former smoker,    Pulmonary exam normal breath sounds clear to auscultation       Cardiovascular Exercise Tolerance: Poor METS: 3 - Mets hypertension, Pt. on medications negative cardio ROS Normal cardiovascular exam Rhythm:Regular Rate:Normal  Controlled HTN    Neuro/Psych Seizures -, Well Controlled,  CVA negative psych ROS   GI/Hepatic negative GI ROS, Neg liver ROS, GERD  Medicated and Controlled,  Endo/Other  negative endocrine ROS  Renal/GU negative Renal ROS  negative genitourinary   Musculoskeletal negative musculoskeletal ROS (+)   Abdominal   Peds  Hematology negative hematology ROS (+)   Anesthesia Other Findings   Reproductive/Obstetrics negative OB ROS                             Anesthesia Physical Anesthesia Plan  ASA: 3  Anesthesia Plan: General   Post-op Pain Management:    Induction: Intravenous  PONV Risk Score and Plan:   Airway Management Planned: Oral ETT and Mask  Additional Equipment:   Intra-op Plan:   Post-operative Plan:   Informed Consent: I have reviewed the patients History and Physical, chart, labs and discussed the procedure including the risks, benefits and alternatives for the proposed anesthesia with the patient or authorized representative who has indicated his/her understanding and acceptance.     Dental Advisory Given  Plan Discussed with: Anesthesiologist, CRNA and Surgeon  Anesthesia Plan Comments:  (Patient consented for risks of anesthesia including but not limited to:  - adverse reactions to medications - risk of airway placement if required - damage to eyes, teeth, lips or other oral mucosa - nerve damage due to positioning  - sore throat or hoarseness - Damage to heart, brain, nerves, lungs, other parts of body or loss of life  Patient voiced understanding.)        Anesthesia Quick Evaluation

## 2022-05-12 NOTE — Anesthesia Procedure Notes (Signed)
Procedure Name: Intubation Date/Time: 05/12/2022 1:02 PM  Performed by: Jonna Clark, CRNAPre-anesthesia Checklist: Patient identified, Patient being monitored, Timeout performed, Emergency Drugs available and Suction available Patient Re-evaluated:Patient Re-evaluated prior to induction Oxygen Delivery Method: Circle system utilized Preoxygenation: Pre-oxygenation with 100% oxygen Induction Type: IV induction Ventilation: Mask ventilation without difficulty Laryngoscope Size: 3 and McGraph Grade View: Grade I Tube type: Oral Tube size: 8.0 mm Number of attempts: 1 Airway Equipment and Method: Stylet Placement Confirmation: ETT inserted through vocal cords under direct vision, positive ETCO2 and breath sounds checked- equal and bilateral Secured at: 21 cm Tube secured with: Tape Dental Injury: Teeth and Oropharynx as per pre-operative assessment

## 2022-05-12 NOTE — Transfer of Care (Signed)
Immediate Anesthesia Transfer of Care Note  Patient: Derek Webb  Procedure(s) Performed: VIDEO BRONCHOSCOPY WITH ENDOBRONCHIAL ULTRASOUND (Right)  Patient Location: PACU  Anesthesia Type:General  Level of Consciousness: drowsy and patient cooperative  Airway & Oxygen Therapy: Patient Spontanous Breathing  Post-op Assessment: Report given to RN and Post -op Vital signs reviewed and stable  Post vital signs: Reviewed and stable  Last Vitals:  Vitals Value Taken Time  BP 135/96 05/12/22 1400  Temp 36.1 C 05/12/22 1353  Pulse 85 05/12/22 1404  Resp 23 05/12/22 1404  SpO2 86 % 05/12/22 1404  Vitals shown include unvalidated device data.  Last Pain:  Vitals:   05/12/22 1353  TempSrc:   PainSc: 0-No pain         Complications: No notable events documented.

## 2022-05-12 NOTE — Telephone Encounter (Signed)
Called and left voicemail for patients sister that once we get the results back from the bronch we will give her a call with all of the results and if needed a follow up will be scheduled. Nothing further needed

## 2022-05-12 NOTE — Discharge Instructions (Addendum)
AMBULATORY SURGERY  DISCHARGE INSTRUCTIONS   The drugs that you were given will stay in your system until tomorrow so for the next 24 hours you should not:  Drive an automobile Make any legal decisions Drink any alcoholic beverage   You may resume regular meals tomorrow.  Today it is better to start with liquids and gradually work up to solid foods.  You may eat anything you prefer, but it is better to start with liquids, then soup and crackers, and gradually work up to solid foods.   Please notify your doctor immediately if you have any unusual bleeding, trouble breathing, redness and pain at the surgery site, drainage, fever, or pain not relieved by medication.  There will be some bleeding upon coughing for a few days due to the biopsy.  This should not be very much.  DO NOT RESTART PLAVIX UNTIL SATURDAY MORNING.  DO NOT RESTART ASPIRIN UNTIL SATURDAY MORNING  You should call Dr. Derrek Gu office for a follow-up visit.  You will be notified of the results of the biopsy as soon as these are available.  These will likely not be available until Friday.                               Please call to schedule your post-operative visit.  Additional Instructions: [phone]

## 2022-05-13 ENCOUNTER — Encounter: Payer: Self-pay | Admitting: Pulmonary Disease

## 2022-05-13 ENCOUNTER — Ambulatory Visit: Payer: Medicare Other | Admitting: Internal Medicine

## 2022-05-13 ENCOUNTER — Other Ambulatory Visit: Payer: Self-pay | Admitting: Pathology

## 2022-05-13 ENCOUNTER — Ambulatory Visit: Payer: Medicare Other

## 2022-05-13 LAB — CYTOLOGY - NON PAP

## 2022-05-13 LAB — SURGICAL PATHOLOGY

## 2022-05-18 ENCOUNTER — Encounter: Payer: Self-pay | Admitting: Radiation Oncology

## 2022-05-18 ENCOUNTER — Encounter: Payer: Self-pay | Admitting: Internal Medicine

## 2022-05-18 ENCOUNTER — Ambulatory Visit
Admission: RE | Admit: 2022-05-18 | Discharge: 2022-05-18 | Disposition: A | Discharge status: Home / Self Care | Payer: Medicare Other | Source: Ambulatory Visit | Attending: Radiation Oncology | Admitting: Radiation Oncology

## 2022-05-18 ENCOUNTER — Inpatient Hospital Stay: Payer: Medicare Other

## 2022-05-18 ENCOUNTER — Encounter: Payer: Self-pay | Admitting: *Deleted

## 2022-05-18 ENCOUNTER — Inpatient Hospital Stay: Payer: Medicare Other | Attending: Internal Medicine | Admitting: Internal Medicine

## 2022-05-18 VITALS — BP 131/84 | HR 62 | Temp 98.1°F | Wt 166.2 lb

## 2022-05-18 DIAGNOSIS — F1010 Alcohol abuse, uncomplicated: Secondary | ICD-10-CM | POA: Insufficient documentation

## 2022-05-18 DIAGNOSIS — G40909 Epilepsy, unspecified, not intractable, without status epilepticus: Secondary | ICD-10-CM | POA: Insufficient documentation

## 2022-05-18 DIAGNOSIS — Z8673 Personal history of transient ischemic attack (TIA), and cerebral infarction without residual deficits: Secondary | ICD-10-CM | POA: Insufficient documentation

## 2022-05-18 DIAGNOSIS — E559 Vitamin D deficiency, unspecified: Secondary | ICD-10-CM | POA: Insufficient documentation

## 2022-05-18 DIAGNOSIS — C3411 Malignant neoplasm of upper lobe, right bronchus or lung: Secondary | ICD-10-CM | POA: Insufficient documentation

## 2022-05-18 DIAGNOSIS — C3490 Malignant neoplasm of unspecified part of unspecified bronchus or lung: Secondary | ICD-10-CM

## 2022-05-18 DIAGNOSIS — E7800 Pure hypercholesterolemia, unspecified: Secondary | ICD-10-CM | POA: Insufficient documentation

## 2022-05-18 DIAGNOSIS — Z79899 Other long term (current) drug therapy: Secondary | ICD-10-CM | POA: Insufficient documentation

## 2022-05-18 DIAGNOSIS — Z87891 Personal history of nicotine dependence: Secondary | ICD-10-CM | POA: Insufficient documentation

## 2022-05-18 DIAGNOSIS — K219 Gastro-esophageal reflux disease without esophagitis: Secondary | ICD-10-CM | POA: Insufficient documentation

## 2022-05-18 DIAGNOSIS — I10 Essential (primary) hypertension: Secondary | ICD-10-CM | POA: Insufficient documentation

## 2022-05-18 DIAGNOSIS — J449 Chronic obstructive pulmonary disease, unspecified: Secondary | ICD-10-CM | POA: Insufficient documentation

## 2022-05-18 DIAGNOSIS — Z7982 Long term (current) use of aspirin: Secondary | ICD-10-CM | POA: Insufficient documentation

## 2022-05-18 DIAGNOSIS — Z801 Family history of malignant neoplasm of trachea, bronchus and lung: Secondary | ICD-10-CM | POA: Diagnosis not present

## 2022-05-18 NOTE — Consult Note (Signed)
NEW PATIENT EVALUATION  Name: Derek Webb  MRN: [id number]  Date:   05/18/2022     DOB: Mar 18, 1951   This 71 y.o. 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  REFERRING PHYSICIAN: [name], Si Gaul, PA*  CHIEF COMPLAINT:  Chief Complaint  Patient presents with   Lung Cancer    DIAGNOSIS: The encounter diagnosis was Squamous cell carcinoma of lung, unspecified laterality ([place]).   PREVIOUS INVESTIGATIONS:  PET/CT CT scans reviewed Pathology report reviewed Clinical notes reviewed  HPI: Patient is a 71 year old male presents with abnormal imaging of his chest with CT scan showing abrupt and slightly regular cut off of the right upper lobe bronchus with associated right upper lobe atelectasis and postobstructive changes.  This is concerning for endobronchial malignancy.  PET scan confirmed hypermetabolic central right upper lobe lung mass nearly occluding the right upper lobe bronchus.  Tumor measured 3.8 x 2.9 cm compatible with primary bronchogenic malignancy.  I believe this is a T4 lesion based on mediastinal involvement.  No hypermetabolic metastatic thoracic or adenopathy or distant metastatic disease was noted.  Patient underwent bronchoscopy which was positive for squamous cell carcinoma.  He is seen today by both medical oncology and radiation oncology for consideration of treatment.  He seems to be a poor historian poor on vocal communication.  He does specifically [place] cough hemoptysis or chest tightness.  PLANNED TREATMENT REGIMEN: Concurrent chemoradiation  PAST MEDICAL HISTORY:  has a past medical history of Alcoholism ([place]), COPD (chronic obstructive pulmonary disease) ([place]), Elevated cholesterol, GERD (gastroesophageal reflux disease), HOH (hard of hearing), Hypertension, Lung cancer ([place]), Seizures ([place]), Stroke ([place]), and Vitamin D deficiency.    PAST SURGICAL HISTORY:  Past Surgical History:   Procedure Laterality Date   CAROTID SURGERY 2009 -LEFT     COLON POLYP EXCISION     COLON SURGERY     COLONOSCOPY WITH PROPOFOL N/A 04/18/2017   Procedure: COLONOSCOPY WITH PROPOFOL;  Surgeon: Manya Silvas, MD;  Location: [facility] ENDOSCOPY;  Service: Endoscopy;  Laterality: N/A;   ESOPHAGOGASTRODUODENOSCOPY     IR GENERIC HISTORICAL  01/21/2016   IR RADIOLOGIST EVAL & MGMT 01/21/2016 Aletta Edouard, MD [facility] INTERV RAD   IR GENERIC HISTORICAL  10/22/2014   IR RADIOLOGIST EVAL & MGMT 10/22/2014 Aletta Edouard, MD [facility] INTERV RAD   IR RADIOLOGIST EVAL & MGMT  02/02/2017   IR RADIOLOGIST EVAL & MGMT  03/08/2018   percutaneous biopsy     VIDEO BRONCHOSCOPY WITH ENDOBRONCHIAL ULTRASOUND Right 05/12/2022   Procedure: VIDEO BRONCHOSCOPY WITH ENDOBRONCHIAL ULTRASOUND;  Surgeon: Tyler Pita, MD;  Location: [facility] ORS;  Service: Cardiopulmonary;  Laterality: Right;    FAMILY HISTORY: family history includes Colon polyps in his sister.  SOCIAL HISTORY:  reports that he quit smoking about 10 years ago. His smoking use included cigarettes. He has a 35.00 pack-year smoking history. He has never used smokeless tobacco. He reports current alcohol use. He reports that he does not use drugs.  ALLERGIES: Patient has no known allergies.  MEDICATIONS:  Current Outpatient Medications  Medication Sig Dispense Refill   aspirin 81 MG tablet Take 81 mg by mouth daily.     atorvastatin (LIPITOR) 80 MG tablet Take 1 tablet (80 mg total) by mouth daily. (Patient taking differently: Take 80 mg by mouth at bedtime.) 90 tablet 3   budesonide-formoterol (SYMBICORT) 160-4.5 MCG/ACT inhaler Inhale 1 puff into the lungs 2 (two) times daily. 1  each 5   Cholecalciferol (VITAMIN D) 2000 UNITS CAPS Take by mouth. VITAMIN D 3 2000 IU  ONE SOFTGEL DAILY  --OTC     clopidogrel (PLAVIX) 75 MG tablet Take 1 tablet (75 mg total) by mouth daily. 90 tablet 3   enalapril (VASOTEC) 20 MG tablet Take 1 tablet (20 mg total) by mouth  daily. 90 tablet 3   ezetimibe (ZETIA) 10 MG tablet Take 1 tablet (10 mg total) by mouth daily. 90 tablet 3   OXYGEN Inhale into the lungs. At night     pantoprazole (PROTONIX) 40 MG tablet Take 1 tablet (40 mg total) by mouth daily. IN AM 90 tablet 1   phenytoin (DILANTIN) 100 MG ER capsule Take 4 capsules (400 mg total) by mouth at bedtime. 450 capsule 1   No current facility-administered medications for this encounter.    ECOG PERFORMANCE STATUS:  0 - Asymptomatic  REVIEW OF SYSTEMS: Patient denies any weight loss, fatigue, weakness, fever, chills or night sweats. Patient denies any loss of vision, blurred vision. Patient denies any ringing  of the ears or hearing loss. No irregular heartbeat. Patient denies heart murmur or history of fainting. Patient denies any chest pain or pain radiating to her upper extremities. Patient denies any shortness of breath, difficulty breathing at night, cough or hemoptysis. Patient denies any swelling in the lower legs. Patient denies any nausea vomiting, vomiting of blood, or coffee ground material in the vomitus. Patient denies any stomach pain. Patient states has had normal bowel movements no significant constipation or diarrhea. Patient denies any dysuria, hematuria or significant nocturia. Patient denies any problems walking, swelling in the joints or loss of balance. Patient denies any skin changes, loss of hair or loss of weight. Patient denies any excessive worrying or anxiety or significant depression. Patient denies any problems with insomnia. Patient denies excessive thirst, polyuria, polydipsia. Patient denies any swollen glands, patient denies easy bruising or easy bleeding. Patient denies any recent infections, allergies or URI. Patient "s visual fields have not changed significantly in recent time.   PHYSICAL EXAM: BP 131/84 (BP Location: Right Arm, Patient Position: Sitting, Cuff Size: Normal)   Pulse 62   Temp 98.1 F (36.7 C) (Tympanic)   Wt  166 lb 3.2 oz (75.4 kg)   BMI 26.03 kg/m  Well-developed well-nourished patient in NAD. HEENT reveals PERLA, EOMI, discs not visualized.  Oral cavity is clear. No oral mucosal lesions are identified. Neck is clear without evidence of cervical or supraclavicular adenopathy. Lungs are clear to A&P. Cardiac examination is essentially unremarkable with regular rate and rhythm without murmur rub or thrill. Abdomen is benign with no organomegaly or masses noted. Motor sensory and DTR levels are equal and symmetric in the upper and lower extremities. Cranial nerves II through XII are grossly intact. Proprioception is intact. No peripheral adenopathy or edema is identified. No motor or sensory levels are noted. Crude visual fields are within normal range.  LABORATORY DATA: Reports reviewed    RADIOLOGY RESULTS: CT scan and PET CT scan reviewed compatible with above-stated findings   IMPRESSION: Stage IIIa squamous cell carcinoma of the right lung in 71 year old male for concurrent chemoradiation  PLAN: This time I have recommended concurrent chemoradiation we will plan on delivering 52 Gray over 7 weeks using IMRT treatment planning and delivery based on the stage IIIa nature of his disease.  I would choose IMRT to to avoid critical structures such as his heart esophagus spinal cord and normal lung volume.  Risks and benefits of treatment clued and possible increased production of cough possible radiation esophagitis fatigue alteration of blood count skin reaction and architectural distortion of his lung all were discussed in detail with the patient and his caregiver.  I have personally set up and ordered CT simulation.  Patient will receive concurrent chemo with radiation therapy as well as immunotherapy be after the initial concurrent treatment.  I personally set up and ordered CT simulation.  We use motion restriction for treatment planning purposes.  I would like to take this opportunity to thank you for  allowing me to participate in the care of your patient.Noreene Filbert, MD

## 2022-05-18 NOTE — Progress Notes (Signed)
Derek Webb CONSULT NOTE  Patient Care Team: [name], Ricki Rodriguez as PCP - General (Physician Assistant) Alena Bills, [facility] as Pharmacist (Pharmacist)  REFERRING PROVIDER: Dr. Patsey Berthold  REASON FOR REFFERAL: newly diagnosed SCCa of right lung  CANCER STAGING   Cancer Staging  Squamous cell lung cancer [facility]) Staging form: Lung, AJCC 7th Edition - Clinical: Stage IIIA (T2a, N2, M0) - Signed by [name], MD on 05/18/2022   ASSESSMENT & PLAN:  Derek Webb 71 y.o. 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  # RUL of lung SCCa, atleast Stage IIIA (cT2aN2M0) -PET CT scan was personally reviewed.  There is a large 4 cm mass in the right upper lobe partially obstructing the right upper lobe bronchus, with postobstructive pneumonia.  Patient is added to tumor board for this Thursday.  Would like to do radiology review to see if there is any mediastinal involvement.  -s/p bronchoscopy with EBUS by Dr. Patsey Berthold on 05/12/2022.  Op note reports significant bulky adenopathy in the subcarinal space and in the right hilum.  Biopsy of subcarinal space positive for [facility]  -Discussed with the patient and sister in detail about staging, diagnosis prognosis and treatment options.  Patient has multiple comorbidities and also the mass does not appear to be resectable.  He had a visit with Dr. Donella Stade this morning and is planned for CT simulation next Tuesday.  I will do concurrent chemo RT with carboplatin AUC 2 and taxol 45 mg/m2 weekly for 6 weeks.  Side effects include but not limited to decreased blood count, increased risk of infection, neuropathy, renal impairment, fatigue, loss of appetite, nausea vomiting.  Discussed about durvalumab maintenance per [facility] trial. 671 randomly  assigned patients received durvalumab (473 of 476) or placebo (236 of 237). As of September 03, 2019 (median follow-up, 34.2 months [all patients]; 61.6 months [censored patients]), updated OS (stratified HR, 0.72; 95% CI, 0.59 to 0.89; median, 47.5 v 29.1 months) and PFS (stratified HR, 0.55; 95% CI, 0.45 to 0.68; median, 16.9 v 5.6 months) remained consistent with the primary analyses. Estimated 5-year rates (95% CI) for durvalumab and placebo were 42.9% (38.2 to 47.4) versus 33.4% (27.3 to 39.6) for OS and 33.1% (28.0 to 38.2) versus 19.0% (13.6 to 25.2) for PFS.  Side effects include but not limited to inflammation organs such as thyroid, colon, lung, liver rarely brain kidneys.  Symptoms may include shortness of breath, dry cough, abdominal pain, diarrhea, occasionally drop in blood count.  Port placement Chemo teach to be scheduled MRI brain with and without contrast to complete staging  Orders Placed This Encounter  Procedures   MR Brain W Wo Contrast    Standing Status:   Future    Standing Expiration Date:   05/18/2023    Order Specific Question:   If indicated for the ordered procedure, I authorize the administration of contrast media per Radiology protocol    Answer:   Yes    Order Specific Question:   What is the patient's sedation requirement?    Answer:   No Sedation    Order Specific Question:   Does the patient have a pacemaker or implanted devices?    Answer:   No    Order Specific Question:   Use SRS Protocol?    Answer:   No  Order Specific Question:   Preferred imaging location?    Answer:   [facility] (table limit - 550lbs)   IR IMAGING GUIDED PORT INSERTION    Standing Status:   Future    Standing Expiration Date:   05/19/2023    Order Specific Question:   Reason for Exam (SYMPTOM  OR DIAGNOSIS REQUIRED)    Answer:   port placement    Order Specific Question:   Preferred Imaging Location?    Answer:   [facility]   RTC TBD coordinate with Dr. Donella Stade  for radiation start date.  The total time spent in the appointment was 60 minutes encounter with patients including review of chart and various tests results, discussions about plan of care and coordination of care plan   All questions were answered. The patient knows to call the clinic with any problems, questions or concerns. No barriers to learning was detected.  [name], MD 9/26/202312:24 PM   HISTORY OF PRESENTING ILLNESS:  Derek Webb 71 y.o. 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  Patient was seen today accompanied by her Sister [name].  Denies any cough, hemoptysis, shortness of breath and weight loss. Remote smoker.  Quit in 2013. Has family history of lung cancer in father.  I have reviewed his chart and materials related to his cancer extensively and collaborated history with the patient. Summary of oncologic history is as follows: Oncology History Overview Note  History of left upper lobe lung SCC status post microwave thermal ablation in 2013 by Dr. Kathlene Cote.  He was deemed not a surgical candidate and had transportation issues for radiation treatment.   Squamous cell lung cancer ([place])  01/12/2022 Initial Diagnosis   Patient has been following with Dr. Humphrey Rolls of pulmonary for suspicious lung nodule. CT chest in March 2021 showed post ablation scarring in LUL not changed and increased atelectasis of RML and RUL. He did not follow through.   CT chest in 01/12/2022 showed  New abrupt and slightly irregular cut off of the right upper lobe bronchus with associated right upper lobe atelectasis and postobstructive changes. Findings are concerning for endobronchial malignancy    04/08/2022 PET scan   IMPRESSION: 1. Hypermetabolic central right upper lobe lung mass  nearly occluding the right upper lobe bronchus, poorly delineated on the noncontrast CT images, measuring approximately 3.8 x 2.9 cm, compatible with primary bronchogenic malignancy. 2. Postobstructive pneumonia/atelectasis throughout the basilar right upper lobe. 3. No hypermetabolic metastatic thoracic adenopathy or distant metastatic disease. 4. Stable mild post ablation change in the left upper lobe with no metabolic evidence of local tumor recurrence in this location. 5. Dilated 4.4 cm ascending thoracic aorta. Recommend annual imaging followup by CTA or MRA   05/12/2022 Procedure   Procedure was delayed due to miscommunication about holding Plavix. S/p bronchoscopy with EBUS by Dr. Patsey Berthold. Op note mentions significant bulky adenopathy in the subcarinal space and in the right hilum.    05/12/2022 Pathology Results   DIAGNOSIS:  A.  LUNG, RIGHT UPPER LOBE; ENB-ASSISTED BIOPSY:  - SQUAMOUS CELL CARCINOMA.   LUNG, RIGHT UPPER LOBE; EBUS-ASSISTED BRUSHING:  - POSITIVE FOR MALIGNANCY.  - SQUAMOUS CELL CARCINOMA IS PRESENT.   LUNG, RIGHT UPPER LOBE; EBUS-ASSISTED LAVAGE:  - POSITIVE FOR MALIGNANCY.  - SQUAMOUS CELL CARCINOMA IS PRESENT  SUBCARINAL  SPACE, RIGHT; EBUS-ASSISTED FNA:  - POSITIVE FOR MALIGNANCY.  - SQUAMOUS CELL CARCINOMA IS PRESENT   05/18/2022 Cancer Staging   Staging form: Lung, AJCC 7th Edition - Clinical: Stage IIIA (T2a, N2, M0) - Signed by [name], MD on 05/18/2022   05/28/2022 -  Chemotherapy   Patient is on Treatment Plan : LUNG Carboplatin + Paclitaxel + XRT q7d       MEDICAL HISTORY:  Past Medical History:  Diagnosis Date   Alcoholism ([place])    COPD (chronic obstructive pulmonary disease) (HCC)    SEVERE COPD -OCCASIONALLY USES OXYGEN AT NIGHT-DOES NOT USE INHALERS ON REGULAR BASIS   Elevated cholesterol    GERD (gastroesophageal reflux disease)    HOH (hard of hearing)    Hypertension    Lung cancer ([place])    LEFT UPPER LUNG  CARCINOMA-NOT A CANDIDATE FOR SURGICAL RESECTION BECAUSE OF HIS COPD AND POOR CANDIDATE FOR RADIATION BECAUSE OF TRANSPORTATION PROBLEMS   Seizures ([place])    CHRONIC DILATIN - PT STATES NO SEIZURES IN PAST COUPLE OF YEARS; PT STATES HIS SEIZURES CAUSED NUMBNESS OF ARMS AND HANDS AND NOT ABLE TO MOVE HIS ARMS   Stroke ([place])    2 TO 3 YRS AGO-AFFECTED HIS SPEECH--ABLE TO AMBULATE WITHOUT ASSIST AND DOES YARD WORK.  DECREASED HEARING IN BOTH EARS SINCE STROKE.   Vitamin D deficiency     SURGICAL HISTORY: Past Surgical History:  Procedure Laterality Date   CAROTID SURGERY 2009 -LEFT     COLON POLYP EXCISION     COLON SURGERY     COLONOSCOPY WITH PROPOFOL N/A 04/18/2017   Procedure: COLONOSCOPY WITH PROPOFOL;  Surgeon: Manya Silvas, MD;  Location: [facility] ENDOSCOPY;  Service: Endoscopy;  Laterality: N/A;   ESOPHAGOGASTRODUODENOSCOPY     IR GENERIC HISTORICAL  01/21/2016   IR RADIOLOGIST EVAL & MGMT 01/21/2016 Aletta Edouard, MD [facility] INTERV RAD   IR GENERIC HISTORICAL  10/22/2014   IR RADIOLOGIST EVAL & MGMT 10/22/2014 Aletta Edouard, MD [facility] INTERV RAD   IR RADIOLOGIST EVAL & MGMT  02/02/2017   IR RADIOLOGIST EVAL & MGMT  03/08/2018   percutaneous biopsy     VIDEO BRONCHOSCOPY WITH ENDOBRONCHIAL ULTRASOUND Right 05/12/2022   Procedure: VIDEO BRONCHOSCOPY WITH ENDOBRONCHIAL ULTRASOUND;  Surgeon: Tyler Pita, MD;  Location: [facility] ORS;  Service: Cardiopulmonary;  Laterality: Right;    SOCIAL HISTORY: Social History   Socioeconomic History   Marital status: Single    Spouse name: Not on file   Number of children: Not on file   Years of education: Not on file   Highest education level: Not on file  Occupational History   Not on file  Tobacco Use   Smoking status: Former    Packs/day: 1.00    Years: 35.00    Total pack years: 35.00    Types: Cigarettes    Quit date: 12/22/2011    Years since quitting: 10.4   Smokeless tobacco: Never  Vaping Use   Vaping Use: Never used   Substance and Sexual Activity   Alcohol use: Yes    Comment: occosionally    Drug use: No   Sexual activity: Not on file  Other Topics Concern   Not on file  Social History Narrative   Not on file   Social Determinants of Health   Financial Resource Strain: Low Risk  (11/28/2020)   Overall Financial Resource Strain (CARDIA)    Difficulty of Paying Living Expenses: Not hard at all  Food Insecurity: Not on  file  Transportation Needs: Not on file  Physical Activity: Not on file  Stress: Not on file  Social Connections: Not on file  Intimate Partner Violence: Not on file    FAMILY HISTORY: Family History  Problem Relation Age of Onset   Colon polyps Sister     ALLERGIES:  has No Known Allergies.  MEDICATIONS:  Current Outpatient Medications  Medication Sig Dispense Refill   aspirin 81 MG tablet Take 81 mg by mouth daily.     atorvastatin (LIPITOR) 80 MG tablet Take 1 tablet (80 mg total) by mouth daily. (Patient taking differently: Take 80 mg by mouth at bedtime.) 90 tablet 3   budesonide-formoterol (SYMBICORT) 160-4.5 MCG/ACT inhaler Inhale 1 puff into the lungs 2 (two) times daily. 1 each 5   Cholecalciferol (VITAMIN D) 2000 UNITS CAPS Take by mouth. VITAMIN D 3 2000 IU  ONE SOFTGEL DAILY  --OTC     clopidogrel (PLAVIX) 75 MG tablet Take 1 tablet (75 mg total) by mouth daily. 90 tablet 3   enalapril (VASOTEC) 20 MG tablet Take 1 tablet (20 mg total) by mouth daily. 90 tablet 3   ezetimibe (ZETIA) 10 MG tablet Take 1 tablet (10 mg total) by mouth daily. 90 tablet 3   OXYGEN Inhale into the lungs. At night     pantoprazole (PROTONIX) 40 MG tablet Take 1 tablet (40 mg total) by mouth daily. IN AM 90 tablet 1   phenytoin (DILANTIN) 100 MG ER capsule Take 4 capsules (400 mg total) by mouth at bedtime. 450 capsule 1   No current facility-administered medications for this visit.    REVIEW OF SYSTEMS:   Pertinent information mentioned in HPI All other systems were reviewed  with the patient and are negative.  PHYSICAL EXAMINATION: ECOG PERFORMANCE STATUS: 2 - Symptomatic, <50% confined to bed  There were no vitals filed for this visit. There were no vitals filed for this visit.  GENERAL:alert, no distress and comfortable SKIN: skin color, texture, turgor are normal, no rashes or significant lesions EYES: normal, conjunctiva are pink and non-injected, sclera clear OROPHARYNX:no exudate, no erythema and lips, buccal mucosa, and tongue normal  NECK: supple, thyroid normal size, non-tender, without nodularity LYMPH:  no palpable lymphadenopathy in the cervical, axillary or inguinal LUNGS: clear to auscultation and percussion with normal breathing effort HEART: regular rate & rhythm and no murmurs and no lower extremity edema ABDOMEN:abdomen soft, non-tender and normal bowel sounds Musculoskeletal:no cyanosis of digits and no clubbing  PSYCH: alert & oriented x 3 with fluent speech NEURO: no focal motor/sensory deficits  LABORATORY DATA:  I have reviewed the data as listed Lab Results  Component Value Date   WBC 6.2 03/22/2022   HGB 12.4 (L) 03/22/2022   HCT 35.8 (L) 03/22/2022   MCV 87 03/22/2022   PLT 456 (H) 03/22/2022   Recent Labs    12/07/21 0814 05/10/22 1019  NA 143 140  K 4.7 4.1  CL 103 103  CO2 26 29  GLUCOSE 94 86  BUN 8 7*  CREATININE 0.65* 0.53*  CALCIUM 10.0 9.5  GFRNONAA  --  >60  PROT 7.6  --   ALBUMIN 4.6  --   AST 17  --   ALT 14  --   ALKPHOS 120  --   BILITOT <0.2  --     RADIOGRAPHIC STUDIES: I have personally reviewed the radiological images as listed and agreed with the findings in the report. DG Chest Port 1 View  Result  Date: 05/12/2022 CLINICAL DATA:  Status post lung biopsy. EXAM: PORTABLE CHEST 1 VIEW COMPARISON:  March 18, 2022.  April 08, 2022. FINDINGS: The heart size and mediastinal contours are within normal limits. Left lung is clear. Right upper lobe opacity is noted consistent with central  obstructive malignancy and postobstructive atelectasis. No definite pneumothorax is noted. The visualized skeletal structures are unremarkable. IMPRESSION: No definite pneumothorax is seen. Right upper lobe opacity is noted consistent with central obstructive malignancy and postobstructive atelectasis. Electronically Signed   By: Marijo Conception M.D.   On: 05/12/2022 14:40

## 2022-05-18 NOTE — Progress Notes (Deleted)
DISCONTINUE ON PATHWAY REGIMEN - Non-Small Cell Lung  No Medical Intervention - Off Treatment.  PRIOR TREATMENT: Off Treatment  Non-Small Cell Lung - No Medical Intervention - Off Treatment.  Patient Characteristics: Preoperative or Nonsurgical Candidate (Clinical Staging), Stage III - Nonsurgical Candidate (Nonsquamous and Squamous), PS ? 2 Therapeutic Status: Preoperative or Nonsurgical Candidate (Clinical Staging) AJCC T Category: cT2a AJCC N Category: cN2 AJCC M Category: cM0 AJCC 8 Stage Grouping: IIIA ECOG Performance Status: 2

## 2022-05-18 NOTE — Progress Notes (Signed)
Non-Small Cell Lung - No Medical Intervention - Off Treatment.  Patient Characteristics: Preoperative or Nonsurgical Candidate (Clinical Staging), Stage III - Nonsurgical Candidate (Nonsquamous and Squamous), PS ? 2 Therapeutic Status: Preoperative or Nonsurgical Candidate (Clinical Staging) AJCC T Category: cT2a AJCC N Category: cN2 AJCC M Category: cM0 AJCC 8 Stage Grouping: IIIA ECOG Performance Status: 2

## 2022-05-18 NOTE — Progress Notes (Deleted)
DISCONTINUE ON PATHWAY REGIMEN - Non-Small Cell Lung  No Medical Intervention - Off Treatment.  PRIOR TREATMENT: OffTx014: Referral to Radiation Oncology  Non-Small Cell Lung - No Medical Intervention - Off Treatment.  Patient Characteristics: Preoperative or Nonsurgical Candidate (Clinical Staging), Stage III - Nonsurgical Candidate (Nonsquamous and Squamous), PS ? 2 Therapeutic Status: Preoperative or Nonsurgical Candidate (Clinical Staging) AJCC T Category: cT2a AJCC N Category: cN2 AJCC M Category: cM0 AJCC 8 Stage Grouping: IIIA ECOG Performance Status: 2

## 2022-05-18 NOTE — Progress Notes (Signed)
Patient here today for initial evaluation regarding lung cancer. Patient seen by Dr. Baruch Gouty this morning also.

## 2022-05-19 ENCOUNTER — Encounter: Payer: Self-pay | Admitting: Internal Medicine

## 2022-05-19 ENCOUNTER — Other Ambulatory Visit: Payer: Self-pay

## 2022-05-19 NOTE — Progress Notes (Signed)
Met with patient and his sister during initial consult with Dr. Darrall Dears. All questions answered during visit. Reviewed upcoming appts and informed will be scheduled for chemo once radiation treatments have been scheduled. Contact info given and instructed to call with any questions or needs. Pt and his sister verbalized understanding.

## 2022-05-20 ENCOUNTER — Other Ambulatory Visit: Payer: Medicare Other

## 2022-05-20 NOTE — Progress Notes (Signed)
Tumor Board Documentation  MATAIO MELE was presented by Dr Darrall Dears at our Tumor Board on 05/20/2022, which included representatives from medical oncology, pathology, radiology, pharmacy, pulmonology, navigation, radiation oncology, internal medicine, research, palliative care, surgical.  Nikolai currently presents as a new patient, for [place], for new positive pathology with history of the following treatments: surgical intervention(s).  Additionally, we reviewed previous medical and familial history, history of present illness, and recent lab results along with all available histopathologic and imaging studies. The tumor board considered available treatment options and made the following recommendations: Concurrent chemo-radiation therapy, Immunotherapy (Carbo/ Taxol followed by Myrtie Cruise)    The following procedures/referrals were also placed: No orders of the defined types were placed in this encounter.   Clinical Trial Status: not discussed   Staging used: Clinical Stage AJCC Staging: T: c2a N: c2 M: 0 Group: Stage III A Squamous Cell Carcinoma of Lung   National site-specific guidelines NCCN were discussed with respect to the case.  Tumor board is a meeting of clinicians from various specialty areas who evaluate and discuss patients for whom a multidisciplinary approach is being considered. Final determinations in the plan of care are those of the provider(s). The responsibility for follow up of recommendations given during tumor board is that of the provider.   Today's extended care, comprehensive team conference, Taryn was not present for the discussion and was not examined.   Multidisciplinary Tumor Board is a multidisciplinary case peer review process.  Decisions discussed in the Multidisciplinary Tumor Board reflect the opinions of the specialists present at the conference without having examined the patient.  Ultimately, treatment and diagnostic decisions rest with  the primary provider(s) and the patient.

## 2022-05-21 ENCOUNTER — Telehealth: Payer: Self-pay | Admitting: *Deleted

## 2022-05-21 ENCOUNTER — Other Ambulatory Visit (HOSPITAL_COMMUNITY): Payer: Self-pay | Admitting: Student

## 2022-05-21 DIAGNOSIS — C3410 Malignant neoplasm of upper lobe, unspecified bronchus or lung: Secondary | ICD-10-CM

## 2022-05-21 NOTE — Telephone Encounter (Signed)
Spoke with patients sister Derek Webb regarding port placement. Derek Webb prefers port placement on Monday 10/2 as patient has many appointments scheduled next week. Patient to [facility] in medical mall at 12:30 for 1:30 procedure. Patients sister Derek Webb verbalized understanding as in agreement with plan. Message sent to [facility] in scheduling with update.

## 2022-05-21 NOTE — Telephone Encounter (Signed)
Call placed to patients sister [name] to discuss appointment options for port placement next week, left vm for return call.  Port can be placed Monday 10/2-arrive 12:30 for 1:30 procedure or Wednesday 10/4-arrive 12:30 for 1:30.

## 2022-05-21 NOTE — Progress Notes (Signed)
Patient scheduled for Port placement 10/2, called and spoke with patients sister/Linda on phone  with pre procedure instructions given. Made aware to be here at 1230, NPO after light breakfast at 0630, and driver post procedure/recovery/discharge. Stated understanding.

## 2022-05-22 ENCOUNTER — Ambulatory Visit
Admission: RE | Admit: 2022-05-22 | Discharge: 2022-05-22 | Disposition: A | Discharge status: Home / Self Care | Payer: Medicare Other | Source: Ambulatory Visit | Attending: Internal Medicine | Admitting: Internal Medicine

## 2022-05-22 DIAGNOSIS — G9389 Other specified disorders of brain: Secondary | ICD-10-CM | POA: Diagnosis not present

## 2022-05-22 DIAGNOSIS — C3490 Malignant neoplasm of unspecified part of unspecified bronchus or lung: Secondary | ICD-10-CM | POA: Insufficient documentation

## 2022-05-22 DIAGNOSIS — I639 Cerebral infarction, unspecified: Secondary | ICD-10-CM | POA: Diagnosis not present

## 2022-05-22 DIAGNOSIS — J341 Cyst and mucocele of nose and nasal sinus: Secondary | ICD-10-CM | POA: Diagnosis not present

## 2022-05-22 MED ORDER — GADOPICLENOL 0.5 MMOL/ML IV SOLN
7.5000 mL | Freq: Once | INTRAVENOUS | Status: AC | PRN
  Administered 2022-05-22: 7.5 mL via INTRAVENOUS

## 2022-05-23 DIAGNOSIS — J449 Chronic obstructive pulmonary disease, unspecified: Secondary | ICD-10-CM | POA: Diagnosis not present

## 2022-05-24 ENCOUNTER — Other Ambulatory Visit: Payer: Self-pay

## 2022-05-24 ENCOUNTER — Ambulatory Visit
Admission: RE | Admit: 2022-05-24 | Discharge: 2022-05-24 | Disposition: A | Discharge status: Home / Self Care | Payer: Medicare Other | Source: Ambulatory Visit | Attending: Internal Medicine | Admitting: Internal Medicine

## 2022-05-24 ENCOUNTER — Encounter: Payer: Self-pay | Admitting: Radiology

## 2022-05-24 ENCOUNTER — Encounter: Payer: Self-pay | Admitting: *Deleted

## 2022-05-24 DIAGNOSIS — C3490 Malignant neoplasm of unspecified part of unspecified bronchus or lung: Secondary | ICD-10-CM

## 2022-05-24 DIAGNOSIS — C3410 Malignant neoplasm of upper lobe, unspecified bronchus or lung: Secondary | ICD-10-CM

## 2022-05-24 MED ORDER — SODIUM CHLORIDE 0.9 % IV SOLN: INTRAVENOUS | Status: DC

## 2022-05-24 NOTE — Progress Notes (Signed)
Spoke with [name] to inform her of new appt for port placement. Reviewed appt for [facility] placement on Fri 10/6 at 10am, arrival at [place] at the [facility]. Reminded for pt to remain NPO after midnight the night before port placement and to ensure he has a driver that day. Reviewed all other appts with [name] for this week as well. All questions answered. Nothing further needed at this time. Instructed to call back with any questions or needs. [name] verbalized understanding.

## 2022-05-25 ENCOUNTER — Ambulatory Visit
Admission: RE | Admit: 2022-05-25 | Discharge: 2022-05-25 | Disposition: A | Discharge status: Home / Self Care | Payer: Medicare Other | Source: Ambulatory Visit | Attending: Radiation Oncology | Admitting: Radiation Oncology

## 2022-05-25 ENCOUNTER — Other Ambulatory Visit: Payer: Self-pay | Admitting: Internal Medicine

## 2022-05-25 ENCOUNTER — Ambulatory Visit: Payer: Medicare Other

## 2022-05-25 DIAGNOSIS — Z5111 Encounter for antineoplastic chemotherapy: Secondary | ICD-10-CM | POA: Insufficient documentation

## 2022-05-25 DIAGNOSIS — Z510 Encounter for antineoplastic radiation therapy: Secondary | ICD-10-CM | POA: Diagnosis not present

## 2022-05-25 DIAGNOSIS — Z87891 Personal history of nicotine dependence: Secondary | ICD-10-CM | POA: Diagnosis not present

## 2022-05-25 DIAGNOSIS — C3411 Malignant neoplasm of upper lobe, right bronchus or lung: Secondary | ICD-10-CM | POA: Insufficient documentation

## 2022-05-25 DIAGNOSIS — C3491 Malignant neoplasm of unspecified part of right bronchus or lung: Secondary | ICD-10-CM

## 2022-05-26 DIAGNOSIS — Z510 Encounter for antineoplastic radiation therapy: Secondary | ICD-10-CM | POA: Diagnosis not present

## 2022-05-26 DIAGNOSIS — Z87891 Personal history of nicotine dependence: Secondary | ICD-10-CM | POA: Diagnosis not present

## 2022-05-26 DIAGNOSIS — C3411 Malignant neoplasm of upper lobe, right bronchus or lung: Secondary | ICD-10-CM | POA: Diagnosis not present

## 2022-05-26 DIAGNOSIS — Z5111 Encounter for antineoplastic chemotherapy: Secondary | ICD-10-CM | POA: Diagnosis not present

## 2022-05-27 ENCOUNTER — Ambulatory Visit (INDEPENDENT_AMBULATORY_CARE_PROVIDER_SITE_OTHER): Payer: Medicare Other

## 2022-05-27 ENCOUNTER — Other Ambulatory Visit: Payer: Self-pay | Admitting: Radiology

## 2022-05-27 ENCOUNTER — Encounter: Payer: Self-pay | Admitting: *Deleted

## 2022-05-27 ENCOUNTER — Inpatient Hospital Stay: Payer: Medicare Other

## 2022-05-27 DIAGNOSIS — C3411 Malignant neoplasm of upper lobe, right bronchus or lung: Secondary | ICD-10-CM | POA: Insufficient documentation

## 2022-05-27 DIAGNOSIS — E538 Deficiency of other specified B group vitamins: Secondary | ICD-10-CM | POA: Diagnosis not present

## 2022-05-27 DIAGNOSIS — Z5111 Encounter for antineoplastic chemotherapy: Secondary | ICD-10-CM | POA: Insufficient documentation

## 2022-05-27 DIAGNOSIS — Z87891 Personal history of nicotine dependence: Secondary | ICD-10-CM | POA: Diagnosis not present

## 2022-05-27 DIAGNOSIS — C3491 Malignant neoplasm of unspecified part of right bronchus or lung: Secondary | ICD-10-CM

## 2022-05-27 MED ORDER — ONDANSETRON HCL 8 MG PO TABS: 8.0000 mg | ORAL_TABLET | Freq: Three times a day (TID) | ORAL | 1 refills | Status: DC | PRN

## 2022-05-27 MED ORDER — DEXAMETHASONE 4 MG PO TABS: ORAL_TABLET | ORAL | 1 refills | Status: DC

## 2022-05-27 MED ORDER — CYANOCOBALAMIN 1000 MCG/ML IJ SOLN
1000.0000 ug | Freq: Once | INTRAMUSCULAR | Status: AC
  Administered 2022-05-27: 1000 ug via INTRAMUSCULAR

## 2022-05-27 MED ORDER — PROCHLORPERAZINE MALEATE 10 MG PO TABS: 10.0000 mg | ORAL_TABLET | Freq: Four times a day (QID) | ORAL | 1 refills | Status: DC | PRN

## 2022-05-27 MED ORDER — LIDOCAINE-PRILOCAINE 2.5-2.5 % EX CREA: TOPICAL_CREAM | CUTANEOUS | 3 refills | Status: DC

## 2022-05-27 NOTE — Progress Notes (Signed)
Met with patient and his sister after chemo class to review upcoming appts for port placement and treatment start with concurrent chemo/rads next week. Reviewed with pt's sister for pt to remain NPO after midnight tonight and pt needs to check in at 9am at the [facility] tomorrow, Friday 10/6. Pt's sister verbalized need for help with transportation. Spoke to Sabrina who advised that pt needs to call his insurance [employer]/Medicaid to utilize their transportation services prior to being scheduled with [employer]. Phone number given for [employer] and advised that she can also call customer service phone number for [employer]. Pt's sister also verbalized need for financial help for pt while going through treatment. Informed will get her an application to fill out for approval for financial assistance. Nothing further needed at this time. Instructed pt's sister to call with any questions or needs. 

## 2022-05-27 NOTE — Addendum Note (Signed)
Addended by: Telford Nab on: 05/27/2022 02:06 PM   Modules accepted: Orders

## 2022-05-28 ENCOUNTER — Encounter: Payer: Self-pay | Admitting: Radiology

## 2022-05-28 ENCOUNTER — Other Ambulatory Visit: Payer: Self-pay

## 2022-05-28 ENCOUNTER — Ambulatory Visit
Admission: RE | Admit: 2022-05-28 | Discharge: 2022-05-28 | Disposition: A | Discharge status: Home / Self Care | Payer: Medicare Other | Source: Ambulatory Visit | Attending: Internal Medicine | Admitting: Internal Medicine

## 2022-05-28 DIAGNOSIS — Z8669 Personal history of other diseases of the nervous system and sense organs: Secondary | ICD-10-CM | POA: Insufficient documentation

## 2022-05-28 DIAGNOSIS — Z9981 Dependence on supplemental oxygen: Secondary | ICD-10-CM | POA: Diagnosis not present

## 2022-05-28 DIAGNOSIS — Z452 Encounter for adjustment and management of vascular access device: Secondary | ICD-10-CM | POA: Diagnosis not present

## 2022-05-28 DIAGNOSIS — Z87891 Personal history of nicotine dependence: Secondary | ICD-10-CM | POA: Diagnosis not present

## 2022-05-28 DIAGNOSIS — C3490 Malignant neoplasm of unspecified part of unspecified bronchus or lung: Secondary | ICD-10-CM | POA: Diagnosis not present

## 2022-05-28 DIAGNOSIS — J449 Chronic obstructive pulmonary disease, unspecified: Secondary | ICD-10-CM | POA: Insufficient documentation

## 2022-05-28 DIAGNOSIS — Z8673 Personal history of transient ischemic attack (TIA), and cerebral infarction without residual deficits: Secondary | ICD-10-CM | POA: Insufficient documentation

## 2022-05-28 HISTORY — PX: IR IMAGING GUIDED PORT INSERTION: IMG5740

## 2022-05-28 MED ORDER — FENTANYL CITRATE (PF) 100 MCG/2ML IJ SOLN
INTRAMUSCULAR | Status: AC | PRN
  Administered 2022-05-28: 50 ug via INTRAVENOUS

## 2022-05-28 MED ORDER — MIDAZOLAM HCL 2 MG/2ML IJ SOLN
INTRAMUSCULAR | Status: AC | PRN
  Administered 2022-05-28: 1 mg via INTRAVENOUS

## 2022-05-28 MED ORDER — HEPARIN SOD (PORK) LOCK FLUSH 100 UNIT/ML IV SOLN
INTRAVENOUS | Status: AC
  Administered 2022-05-28: 500 [IU]
  Filled 2022-05-28: qty 5

## 2022-05-28 MED ORDER — SODIUM CHLORIDE 0.9 % IV SOLN: INTRAVENOUS | Status: DC

## 2022-05-28 MED ORDER — LIDOCAINE-EPINEPHRINE 1 %-1:100000 IJ SOLN
INTRAMUSCULAR | Status: AC
  Administered 2022-05-28: 10 mL
  Filled 2022-05-28: qty 1

## 2022-05-28 NOTE — H&P (Signed)
Chief Complaint: Patient was seen in consultation today for port-a-catheter placement   Referring Physician(s): Agrawal,Kavita  Supervising Physician: Mir, Sharen Heck  Patient Status: [facility] - Out-pt  History of Present Illness: Derek Webb is a 71 y.o. 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 He has been under image surveillance since then and recent imaging showed multiple lung nodules including a new right upper lobe mass. 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 His oncology team is preparing him for chemotherapy.    Interventional Radiology has been asked to evaluate this patient for an image-guided port-a-catheter placement to facilitate his treatment goals.   Past Medical History:  Diagnosis Date   Alcoholism ([place])    COPD (chronic obstructive pulmonary disease) (HCC)    SEVERE COPD -OCCASIONALLY USES OXYGEN AT NIGHT-DOES NOT USE INHALERS ON REGULAR BASIS   Elevated cholesterol    GERD (gastroesophageal reflux disease)    HOH (hard of hearing)    Hypertension    Lung cancer ([place])    LEFT UPPER LUNG CARCINOMA-NOT A CANDIDATE FOR SURGICAL RESECTION BECAUSE OF HIS COPD AND POOR CANDIDATE FOR RADIATION BECAUSE OF TRANSPORTATION PROBLEMS   Seizures ([place])    CHRONIC DILATIN - PT STATES NO SEIZURES IN PAST COUPLE OF YEARS; PT STATES HIS SEIZURES CAUSED NUMBNESS OF ARMS AND HANDS AND NOT ABLE TO MOVE HIS ARMS   Stroke ([place])    2 TO 3 YRS AGO-AFFECTED HIS SPEECH--ABLE TO AMBULATE WITHOUT ASSIST AND DOES YARD WORK.  DECREASED HEARING IN BOTH EARS SINCE STROKE.   Vitamin D deficiency     Past Surgical History:  Procedure Laterality Date   CAROTID SURGERY 2009 -LEFT     COLON POLYP EXCISION     COLON SURGERY     COLONOSCOPY WITH PROPOFOL N/A 04/18/2017   Procedure: COLONOSCOPY WITH PROPOFOL;  Surgeon: Manya Silvas, MD;   Location: [facility] ENDOSCOPY;  Service: Endoscopy;  Laterality: N/A;   ESOPHAGOGASTRODUODENOSCOPY     IR GENERIC HISTORICAL  01/21/2016   IR RADIOLOGIST EVAL & MGMT 01/21/2016 Aletta Edouard, MD [facility] INTERV RAD   IR GENERIC HISTORICAL  10/22/2014   IR RADIOLOGIST EVAL & MGMT 10/22/2014 Aletta Edouard, MD [facility] INTERV RAD   IR RADIOLOGIST EVAL & MGMT  02/02/2017   IR RADIOLOGIST EVAL & MGMT  03/08/2018   percutaneous biopsy     VIDEO BRONCHOSCOPY WITH ENDOBRONCHIAL ULTRASOUND Right 05/12/2022   Procedure: VIDEO BRONCHOSCOPY WITH ENDOBRONCHIAL ULTRASOUND;  Surgeon: Tyler Pita, MD;  Location: [facility] ORS;  Service: Cardiopulmonary;  Laterality: Right;    Allergies: Patient has no known allergies.  Medications: Prior to Admission medications   Medication Sig Start Date End Date Taking? Authorizing Provider  aspirin 81 MG tablet Take 81 mg by mouth daily.    [provider]  atorvastatin (LIPITOR) 80 MG tablet Take 1 tablet (80 mg total) by mouth daily. Patient taking differently: Take 80 mg by mouth at bedtime. 03/01/22   [name], Si Gaul, PA-C  budesonide-formoterol (SYMBICORT) 160-4.5 MCG/ACT inhaler Inhale 1 puff into the lungs 2 (two) times daily. 05/25/21   Jonetta Osgood, NP  Cholecalciferol (VITAMIN D) 2000 UNITS CAPS Take by mouth. VITAMIN D 3 2000 IU  ONE SOFTGEL DAILY  --OTC    [provider]  clopidogrel (PLAVIX) 75 MG tablet Take 1 tablet (75 mg total) by mouth daily. 03/01/22   [name], Si Gaul, PA-C  dexamethasone (  DECADRON) 4 MG tablet Take 2 tablets daily for 2 days, start the day after chemotherapy. Take with food. 05/27/22   [name], MD  enalapril (VASOTEC) 20 MG tablet Take 1 tablet (20 mg total) by mouth daily. 03/01/22   [name], Si Gaul, PA-C  ezetimibe (ZETIA) 10 MG tablet Take 1 tablet (10 mg total) by mouth daily. 03/01/22   [name], Si Gaul, PA-C  lidocaine-prilocaine (EMLA) cream Apply to affected area once 05/27/22   [name],  MD  ondansetron (ZOFRAN) 8 MG tablet Take 1 tablet (8 mg total) by mouth every 8 (eight) hours as needed for nausea or vomiting. Start on the third day after chemotherapy. 05/27/22   [name], MD  OXYGEN Inhale into the lungs. At night    [provider]  pantoprazole (PROTONIX) 40 MG tablet Take 1 tablet (40 mg total) by mouth daily. IN AM 12/17/21   [name], Si Gaul, PA-C  phenytoin (DILANTIN) 100 MG ER capsule Take 4 capsules (400 mg total) by mouth at bedtime. 03/01/22   [name], Si Gaul, PA-C  prochlorperazine (COMPAZINE) 10 MG tablet Take 1 tablet (10 mg total) by mouth every 6 (six) hours as needed for nausea or vomiting. 05/27/22   [name], MD     Family History  Problem Relation Age of Onset   Colon polyps Sister     Social History   Socioeconomic History   Marital status: Single    Spouse name: Not on file   Number of children: Not on file   Years of education: Not on file   Highest education level: Not on file  Occupational History   Not on file  Tobacco Use   Smoking status: Former    Packs/day: 1.00    Years: 35.00    Total pack years: 35.00    Types: Cigarettes    Quit date: 12/22/2011    Years since quitting: 10.4   Smokeless tobacco: Never  Vaping Use   Vaping Use: Never used  Substance and Sexual Activity   Alcohol use: Yes    Comment: occosionally    Drug use: No   Sexual activity: Not on file  Other Topics Concern   Not on file  Social History Narrative   Not on file   Social Determinants of Health   Financial Resource Strain: Low Risk  (11/28/2020)   Overall Financial Resource Strain (CARDIA)    Difficulty of Paying Living Expenses: Not hard at all  Food Insecurity: Not on file  Transportation Needs: Not on file  Physical Activity: Not on file  Stress: Not on file  Social Connections: Not on file    Review of Systems: A 12 point ROS discussed and pertinent positives are indicated in the HPI above.  All other systems are  negative.  Review of Systems  Constitutional:  Negative for appetite change and fatigue.  Respiratory:  Negative for cough and shortness of breath.   Cardiovascular:  Negative for chest pain and leg swelling.  Gastrointestinal:  Negative for abdominal pain, diarrhea, nausea and vomiting.  Neurological:  Negative for dizziness and headaches.    Vital Signs: BP (!) 139/91   Pulse 70   Temp 99 F (37.2 C) (Oral)   Resp 19   Ht 5\' 7"  (1.702 m)   Wt 168 lb (76.2 kg)   SpO2 98%   BMI 26.31 kg/m   Physical Exam Constitutional:      General: He is not in acute distress.    Appearance:  He is not ill-appearing.  HENT:     Mouth/Throat:     Mouth: Mucous membranes are moist.     Pharynx: Oropharynx is clear.  Cardiovascular:     Rate and Rhythm: Normal rate and regular rhythm.     Pulses: Normal pulses.  Pulmonary:     Effort: Pulmonary effort is normal.  Skin:    General: Skin is warm and dry.  Neurological:     Mental Status: He is alert and oriented to person, place, and time.     Imaging: MR Brain W Wo Contrast  Result Date: 05/25/2022 CLINICAL DATA:  Non-small cell lung cancer, evaluate for metastases EXAM: MRI HEAD WITHOUT AND WITH CONTRAST TECHNIQUE: Multiplanar, multiecho pulse sequences of the brain and surrounding structures were obtained without and with intravenous contrast. CONTRAST:  7.5 mL V[facility]way COMPARISON:  MRI head 04/22/2008 FINDINGS: Brain: No abnormal parenchymal or meningeal enhancement. No restricted diffusion to suggest acute or subacute infarct. No acute hemorrhage, mass, mass effect, or midline shift. No hydrocephalus or extra-axial collection. Encephalomalacia in the bilateral posterior temporal, lateral occipital, and inferior parietal lobes, sequela of remote infarcts. Sequela of remote infarcts in the left basal ganglia and left frontal white matter. Vascular: Normal arterial flow voids. Normal arterial and venous enhancement. Skull and upper cervical  spine: Small enhancing foci in the right parietal calvarium (series 18, image 132 and 135) and right occipital calvarium (series 18, image 83). Otherwise normal marrow signal. Sinuses/Orbits: Mucous retention cyst in the left maxillary sinus. Otherwise clear paranasal sinuses. The orbits are unremarkable. Other: Trace fluid in the right mastoid air cells. IMPRESSION: 1. No evidence of metastatic disease in the brain. 2. Small enhancing foci in the right parietal and right occipital calvarium, which are indeterminate but could represent small osseous metastases. Attention on follow-up. Electronically Signed   By: Merilyn Baba M.D.   On: 05/25/2022 19:26   DG Chest Port 1 View  Result Date: 05/12/2022 CLINICAL DATA:  Status post lung biopsy. EXAM: PORTABLE CHEST 1 VIEW COMPARISON:  March 18, 2022.  April 08, 2022. FINDINGS: The heart size and mediastinal contours are within normal limits. Left lung is clear. Right upper lobe opacity is noted consistent with central obstructive malignancy and postobstructive atelectasis. No definite pneumothorax is noted. The visualized skeletal structures are unremarkable. IMPRESSION: No definite pneumothorax is seen. Right upper lobe opacity is noted consistent with central obstructive malignancy and postobstructive atelectasis. Electronically Signed   By: Marijo Conception M.D.   On: 05/12/2022 14:40    Labs:  CBC: Recent Labs    12/07/21 0814 03/22/22 1032  WBC 4.6 6.2  HGB 10.6* 12.4*  HCT 33.8* 35.8*  PLT 517* 456*    COAGS: No results for input(s): "INR", "APTT" in the last 8760 hours.  BMP: Recent Labs    12/07/21 0814 05/10/22 1019  NA 143 140  K 4.7 4.1  CL 103 103  CO2 26 29  GLUCOSE 94 86  BUN 8 7*  CALCIUM 10.0 9.5  CREATININE 0.65* 0.53*  GFRNONAA  --  >60    LIVER FUNCTION TESTS: Recent Labs    12/07/21 0814  BILITOT <0.2  AST 17  ALT 14  ALKPHOS 120  PROT 7.6  ALBUMIN 4.6    TUMOR MARKERS: No results for input(s):  "AFPTM", "CEA", "CA199", "CHROMGRNA" in the last 8760 hours.  Assessment and Plan:  RUL Lung cancer; pending chemotherapy: Ewald A. Matuszak, 71 year old male, presents today to the [facility]  Interventional Radiology department for an image-guided port-a-catheter placement.  Risks and benefits of image-guided Port-a-catheter placement were discussed with the patient including, but not limited to bleeding, infection, pneumothorax, or fibrin sheath development and need for additional procedures.  All of the patient's questions were answered, patient is agreeable to proceed. He has been NPO.   Consent signed and in chart.   Thank you for this interesting consult.  I greatly enjoyed meeting TOME WILSON and look forward to participating in their care.  A copy of this report was sent to the requesting provider on this date.  Electronically Signed: Soyla Dryer, AGACNP-BC [phone] 05/28/2022, 10:23 AM   I spent a total of  30 Minutes   in face to face in clinical consultation, greater than 50% of which was counseling/coordinating care for port-a-catheter placement.

## 2022-05-28 NOTE — Procedures (Signed)
Interventional Radiology Procedure Note  Procedure: Port placement.  Indication: Lung Ca  Findings: Please refer to procedural dictation for full description.  Complications: None  EBL: < 10 mL  Miachel Roux, MD [phone]

## 2022-06-02 ENCOUNTER — Ambulatory Visit: Admission: RE | Admit: 2022-06-02 | Payer: Medicare Other | Source: Ambulatory Visit

## 2022-06-02 ENCOUNTER — Encounter: Payer: Self-pay | Admitting: *Deleted

## 2022-06-02 NOTE — Progress Notes (Signed)
Met pt and his sister in the lobby after radiation appt today. Reviewed grant application with pt and his sister. Application signed and copy given to them for their records. Informed that once approved then will be able to help with gas and groceries during treatment. Reviewed upcoming appt for chemo tomorrow. All questions answered during visit. Pt and sister verbalized understanding. Nothing further needed at this time.

## 2022-06-03 ENCOUNTER — Ambulatory Visit: Payer: Medicare Other

## 2022-06-03 ENCOUNTER — Encounter: Payer: Self-pay | Admitting: Internal Medicine

## 2022-06-03 ENCOUNTER — Ambulatory Visit
Admission: RE | Admit: 2022-06-03 | Discharge: 2022-06-03 | Disposition: A | Discharge status: Home / Self Care | Payer: Medicare Other | Source: Ambulatory Visit | Attending: Radiation Oncology | Admitting: Radiation Oncology

## 2022-06-03 ENCOUNTER — Inpatient Hospital Stay (HOSPITAL_BASED_OUTPATIENT_CLINIC_OR_DEPARTMENT_OTHER): Payer: Medicare Other | Admitting: Internal Medicine

## 2022-06-03 ENCOUNTER — Other Ambulatory Visit: Payer: Self-pay

## 2022-06-03 ENCOUNTER — Encounter: Payer: Self-pay | Admitting: *Deleted

## 2022-06-03 ENCOUNTER — Inpatient Hospital Stay: Payer: Medicare Other

## 2022-06-03 VITALS — BP 141/86 | HR 59 | Resp 16

## 2022-06-03 VITALS — BP 117/73 | Temp 98.4°F | Resp 18 | Wt 166.0 lb

## 2022-06-03 DIAGNOSIS — Z5111 Encounter for antineoplastic chemotherapy: Secondary | ICD-10-CM | POA: Diagnosis not present

## 2022-06-03 DIAGNOSIS — C3491 Malignant neoplasm of unspecified part of right bronchus or lung: Secondary | ICD-10-CM

## 2022-06-03 DIAGNOSIS — C3411 Malignant neoplasm of upper lobe, right bronchus or lung: Secondary | ICD-10-CM | POA: Diagnosis not present

## 2022-06-03 DIAGNOSIS — Z510 Encounter for antineoplastic radiation therapy: Secondary | ICD-10-CM | POA: Diagnosis not present

## 2022-06-03 DIAGNOSIS — Z87891 Personal history of nicotine dependence: Secondary | ICD-10-CM | POA: Diagnosis not present

## 2022-06-03 LAB — COMPREHENSIVE METABOLIC PANEL
ALT: 12 U/L (ref 0–44)
AST: 16 U/L (ref 15–41)
Albumin: 3.8 g/dL (ref 3.5–5.0)
Alkaline Phosphatase: 116 U/L (ref 38–126)
Anion gap: 8 (ref 5–15)
BUN: 6 mg/dL — ABNORMAL LOW (ref 8–23)
CO2: 26 mmol/L (ref 22–32)
Calcium: 8.8 mg/dL — ABNORMAL LOW (ref 8.9–10.3)
Chloride: 100 mmol/L (ref 98–111)
Creatinine, Ser: 0.53 mg/dL — ABNORMAL LOW (ref 0.61–1.24)
GFR, Estimated: >60 mL/min (ref >60)
Glucose, Bld: 89 mg/dL (ref 70–99)
Potassium: 3.6 mmol/L (ref 3.5–5.1)
Sodium: 134 mmol/L — ABNORMAL LOW (ref 135–145)
Total Bilirubin: 0.8 mg/dL (ref 0.3–1.2)
Total Protein: 7.6 g/dL (ref 6.5–8.1)

## 2022-06-03 LAB — RAD ONC ARIA SESSION SUMMARY
Course Elapsed Days: 0
Plan Fractions Treated to Date: 1
Plan Prescribed Dose Per Fraction: 2 Gy
Plan Total Fractions Prescribed: 35
Plan Total Prescribed Dose: 70 Gy
Reference Point Dosage Given to Date: 2 Gy
Reference Point Session Dosage Given: 2 Gy
Session Number: 1

## 2022-06-03 LAB — CBC WITH DIFFERENTIAL/PLATELET
Abs Immature Granulocytes: 0.01 10*3/uL (ref 0.00–0.07)
Basophils Absolute: 0.1 10*3/uL (ref 0.0–0.1)
Basophils Relative: 1 %
Eosinophils Absolute: 0.3 10*3/uL (ref 0.0–0.5)
Eosinophils Relative: 6 %
HCT: 36.1 % — ABNORMAL LOW (ref 39.0–52.0)
Hemoglobin: 12.7 g/dL — ABNORMAL LOW (ref 13.0–17.0)
Immature Granulocytes: 0 %
Lymphocytes Relative: 34 %
Lymphs Abs: 1.6 10*3/uL (ref 0.7–4.0)
MCH: 30.8 pg (ref 26.0–34.0)
MCHC: 35.2 g/dL (ref 30.0–36.0)
MCV: 87.4 fL (ref 80.0–100.0)
Monocytes Absolute: 0.5 10*3/uL (ref 0.1–1.0)
Monocytes Relative: 11 %
Neutro Abs: 2.2 10*3/uL (ref 1.7–7.7)
Neutrophils Relative %: 48 %
Platelets: 414 10*3/uL — ABNORMAL HIGH (ref 150–400)
RBC: 4.13 MIL/uL — ABNORMAL LOW (ref 4.22–5.81)
RDW: 12.9 % (ref 11.5–15.5)
WBC: 4.6 10*3/uL (ref 4.0–10.5)
nRBC: 0 % (ref 0.0–0.2)

## 2022-06-03 MED ORDER — SODIUM CHLORIDE 0.9 % IV SOLN
200.8000 mg | Freq: Once | INTRAVENOUS | Status: AC
  Administered 2022-06-03: 200 mg via INTRAVENOUS
  Filled 2022-06-03: qty 20

## 2022-06-03 MED ORDER — HEPARIN SOD (PORK) LOCK FLUSH 100 UNIT/ML IV SOLN
500.0000 [IU] | Freq: Once | INTRAVENOUS | Status: AC | PRN
  Administered 2022-06-03: 500 [IU]
  Filled 2022-06-03: qty 5

## 2022-06-03 MED ORDER — FAMOTIDINE IN NACL 20-0.9 MG/50ML-% IV SOLN
20.0000 mg | Freq: Once | INTRAVENOUS | Status: AC
  Administered 2022-06-03: 20 mg via INTRAVENOUS
  Filled 2022-06-03: qty 50

## 2022-06-03 MED ORDER — PALONOSETRON HCL INJECTION 0.25 MG/5ML
0.2500 mg | Freq: Once | INTRAVENOUS | Status: AC
  Administered 2022-06-03: 0.25 mg via INTRAVENOUS
  Filled 2022-06-03: qty 5

## 2022-06-03 MED ORDER — SODIUM CHLORIDE 0.9 % IV SOLN
45.0000 mg/m2 | Freq: Once | INTRAVENOUS | Status: AC
  Administered 2022-06-03: 84 mg via INTRAVENOUS
  Filled 2022-06-03: qty 14

## 2022-06-03 MED ORDER — DIPHENHYDRAMINE HCL 50 MG/ML IJ SOLN
50.0000 mg | Freq: Once | INTRAMUSCULAR | Status: AC
  Administered 2022-06-03: 50 mg via INTRAVENOUS
  Filled 2022-06-03: qty 1

## 2022-06-03 MED ORDER — SODIUM CHLORIDE 0.9 % IV SOLN
10.0000 mg | Freq: Once | INTRAVENOUS | Status: AC
  Administered 2022-06-03: 10 mg via INTRAVENOUS
  Filled 2022-06-03: qty 10

## 2022-06-03 MED ORDER — SODIUM CHLORIDE 0.9 % IV SOLN
Freq: Once | INTRAVENOUS | Status: AC
  Filled 2022-06-03: qty 250

## 2022-06-03 NOTE — Patient Instructions (Signed)
[facility] CANCER CTR AT [place]  Discharge Instructions: Thank you for choosing [facility] to provide your oncology and hematology care.  If you have a lab appointment with the [facility], please go directly to the [facility] and check in at the registration area.  Wear comfortable clothing and clothing appropriate for easy access to any Portacath or PICC line.   We strive to give you quality time with your provider. You may need to reschedule your appointment if you arrive late (15 or more minutes).  Arriving late affects you and other patients whose appointments are after yours.  Also, if you miss three or more appointments without notifying the office, you may be dismissed from the clinic at the provider's discretion.      For prescription refill requests, have your pharmacy contact our office and allow 72 hours for refills to be completed.    Today you received the following chemotherapy and/or immunotherapy agents Carboplatin & Taxol       To help prevent nausea and vomiting after your treatment, we encourage you to take your nausea medication as directed.  BELOW ARE SYMPTOMS THAT SHOULD BE REPORTED IMMEDIATELY: *FEVER GREATER THAN 100.4 F (38 C) OR HIGHER *CHILLS OR SWEATING *NAUSEA AND VOMITING THAT IS NOT CONTROLLED WITH YOUR NAUSEA MEDICATION *UNUSUAL SHORTNESS OF BREATH *UNUSUAL BRUISING OR BLEEDING *URINARY PROBLEMS (pain or burning when urinating, or frequent urination) *BOWEL PROBLEMS (unusual diarrhea, constipation, pain near the anus) TENDERNESS IN MOUTH AND THROAT WITH OR WITHOUT PRESENCE OF ULCERS (sore throat, sores in mouth, or a toothache) UNUSUAL RASH, SWELLING OR PAIN  UNUSUAL VAGINAL DISCHARGE OR ITCHING   Items with * indicate a potential emergency and should be followed up as soon as possible or go to the Emergency Department if any problems should occur.  Please show the CHEMOTHERAPY ALERT CARD or IMMUNOTHERAPY ALERT CARD at  check-in to the Emergency Department and triage nurse.  Should you have questions after your visit or need to cancel or reschedule your appointment, please contact [facility] CANCER [place] AT [place] [employer]  [phone] and follow the prompts.  Office hours are 8:00 a.m. to 4:30 p.m. Monday - Friday. Please note that voicemails left after 4:00 p.m. may not be returned until the following business day.  We are closed weekends and major holidays. You have access to a nurse at all times for urgent questions. Please call the main number to the clinic [phone] and follow the prompts.  For any non-urgent questions, you may also contact your provider using MyChart. We now offer e-Visits for anyone 47 and older to request care online for non-urgent symptoms. For details visit [email or web address].   Also download the MyChart app! Go to the app store, search "MyChart", open the app, select [facility], and log in with your MyChart username and password.  Masks are optional in the cancer centers. If you would like for your care team to wear a mask while they are taking care of you, please let them know. For doctor visits, patients may have with them one support person who is at least 69 years old. At this time, visitors are not allowed in the infusion area.

## 2022-06-03 NOTE — Progress Notes (Signed)
Met with patient and his sister, [name], prior to start chemotherapy today. All questions answered during visit. Informed will be given appts prior to leaving today. Reminded that pt needs to go to radiation after chemotherapy treatment today. Instructed to call with any questions or needs. Will follow up next week for second cycle of chemo. Nothing further needed at this time.

## 2022-06-03 NOTE — Patient Instructions (Signed)
Take Dexamethasone for 2 days starting tomorrow.  If you have fever > 100.51F call us or go to nearby emergency room to make sure you do not have infection.

## 2022-06-03 NOTE — Progress Notes (Signed)
[facility] CONSULT NOTE  Patient Care Team: [name], Ricki Rodriguez as PCP - General (Physician Assistant) Alena Bills, [facility] as Pharmacist (Pharmacist) Telford Nab, RN as Oncology Nurse Navigator  REFERRING PROVIDER: Dr. Patsey Berthold  REASON FOR REFFERAL: newly diagnosed SCCa of right lung  CANCER STAGING   Cancer Staging  Squamous cell lung cancer [facility]) Staging form: Lung, AJCC 7th Edition - Clinical: Stage IIIA (T4, N0, M0) - Signed by [name], MD on 06/03/2022  CURRENT TREATMENT- Weekly Carboplatin and Taxol with RT started 06/03/2022.   ASSESSMENT & PLAN:  Derek Webb 71 y.o. 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  #RUL of lung SCCa, atleast Stage IIIA (cT4N0M0) #Encounter for antineoplastic chemotherapy -PET CT scan was personally reviewed.  There is a large 4 cm mass in the right upper lobe partially obstructing the right upper lobe bronchus, involving the hilum and subcarinal space with postobstructive pneumonia. No lymphadenopathy.   -s/p bronchoscopy with EBUS by Dr. Patsey Berthold on 05/12/2022. -MRI brain on 05/22/2022 personally reviewed.  No evidence of metastatic disease in the brain. Small enhancing foci in the right parietal and right occipital calvarium, which are indeterminate but could represent small osseous metastases. Attention on follow-up. - not a surgical candidate  -Labs reviewed and nonactionable.  I will start with weekly carboplatin AUC 2 and paclitaxel 45 mg/m2 today concurrent with radiation with Dr. Donella Stade.  He has antiemetics Zofran and Compazine.  We discussed about use of Decadron for 2 days starting tomorrow.  Has Emla cream.  Discussed about keeping thermometer at home and to report for temperature more than 100.4.  -Referral  to nutrition placed to assist with supplements. -Our thoracic coordinator Hildred Alamin has been closely involved in providing assistance to the patient.  # Access - port placed on 05/28/2022   Orders Placed This Encounter  Procedures   CBC with Differential    Standing Status:   Future    Standing Expiration Date:   06/11/2023   Comprehensive metabolic panel    Standing Status:   Future    Standing Expiration Date:   06/11/2023   RTC in 1 week for md visit, labs, c2 carbo/taxol   The total time spent in the appointment was 30 minutes encounter with patients including review of chart and various tests results, discussions about plan of care and coordination of care plan   All questions were answered. The patient knows to call the clinic with any problems, questions or concerns. No barriers to learning was detected.  [name], MD 10/12/20239:32 AM   HISTORY OF PRESENTING ILLNESS:  Derek Webb 71 y.o. 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  Patient was seen today accompanied by her Sister [name].  Denies any cough, hemoptysis, shortness of breath and weight loss. Remote smoker.  Quit in 2013. Has family history of lung cancer in father.  INTERVAL HISTORY-  Patient seen today accompained by sister prior to starting weekly carbo/taxol with RT.  Patient denies fever, chills, nausea, vomiting, shortness of breath, cough, abdominal pain, bleeding, bowel or bladder issues. Energy level is good.  Appetite is good.  Denies any  weight loss. Denies pain.   I have reviewed his chart and materials related to his cancer extensively and collaborated history with the patient. Summary of oncologic history is as follows: Oncology History Overview Note  History of left upper lobe lung  SCC status post microwave thermal ablation in 2013 by Dr. Kathlene Cote.  He was deemed not a surgical candidate and had transportation issues for radiation treatment.   Squamous cell lung cancer ([place])  01/12/2022 Initial Diagnosis   Patient has been following with Dr. Humphrey Rolls of pulmonary for suspicious lung nodule. CT chest in March 2021 showed post ablation scarring in LUL not changed and increased atelectasis of RML and RUL. He did not follow through.   CT chest in 01/12/2022 showed  New abrupt and slightly irregular cut off of the right upper lobe bronchus with associated right upper lobe atelectasis and postobstructive changes. Findings are concerning for endobronchial malignancy    04/08/2022 PET scan   IMPRESSION: 1. Hypermetabolic central right upper lobe lung mass nearly occluding the right upper lobe bronchus, poorly delineated on the noncontrast CT images, measuring approximately 3.8 x 2.9 cm, compatible with primary bronchogenic malignancy. 2. Postobstructive pneumonia/atelectasis throughout the basilar right upper lobe. 3. No hypermetabolic metastatic thoracic adenopathy or distant metastatic disease. 4. Stable mild post ablation change in the left upper lobe with no metabolic evidence of local tumor recurrence in this location. 5. Dilated 4.4 cm ascending thoracic aorta. Recommend annual imaging followup by CTA or MRA   05/12/2022 Procedure   Procedure was delayed due to miscommunication about holding Plavix. S/p bronchoscopy with EBUS by Dr. Patsey Berthold. Op note mentions significant bulky adenopathy in the subcarinal space and in the right hilum.    05/12/2022 Pathology Results   DIAGNOSIS:  A.  LUNG, RIGHT UPPER LOBE; ENB-ASSISTED BIOPSY:  - SQUAMOUS CELL CARCINOMA.   LUNG, RIGHT UPPER LOBE; EBUS-ASSISTED BRUSHING:  - POSITIVE FOR MALIGNANCY.  - SQUAMOUS CELL CARCINOMA IS PRESENT.   LUNG, RIGHT UPPER LOBE; EBUS-ASSISTED LAVAGE:  - POSITIVE FOR MALIGNANCY.  - SQUAMOUS CELL  CARCINOMA IS PRESENT  SUBCARINAL SPACE, RIGHT; EBUS-ASSISTED FNA:  - POSITIVE FOR MALIGNANCY.  - SQUAMOUS CELL CARCINOMA IS PRESENT   05/18/2022 Cancer Staging   Staging form: Lung, AJCC 7th Edition - Clinical: Stage IIIA (T4, N0, M0) - Signed by [name], MD on 06/03/2022   05/21/2022 Imaging   MRI brain  No evidence of metastatic disease in the brain. 2. Small enhancing foci in the right parietal and right occipital calvarium, which are indeterminate but could represent small osseous metastases. Attention on follow-up   06/03/2022 -  Chemotherapy   Patient is on Treatment Plan : LUNG Carboplatin + Paclitaxel + XRT q7d       MEDICAL HISTORY:  Past Medical History:  Diagnosis Date   Alcoholism ([place])    COPD (chronic obstructive pulmonary disease) (HCC)    SEVERE COPD -OCCASIONALLY USES OXYGEN AT NIGHT-DOES NOT USE INHALERS ON REGULAR BASIS   Elevated cholesterol    GERD (gastroesophageal reflux disease)    HOH (hard of hearing)    Hypertension    Lung cancer ([place])    LEFT UPPER LUNG CARCINOMA-NOT A CANDIDATE FOR SURGICAL RESECTION BECAUSE OF HIS COPD AND POOR CANDIDATE FOR RADIATION BECAUSE OF TRANSPORTATION PROBLEMS   Seizures ([place])    CHRONIC DILATIN - PT STATES NO SEIZURES IN PAST COUPLE OF YEARS; PT STATES HIS SEIZURES CAUSED NUMBNESS OF ARMS AND HANDS AND NOT ABLE TO MOVE HIS ARMS   Stroke ([place])  2 TO 3 YRS AGO-AFFECTED HIS SPEECH--ABLE TO AMBULATE WITHOUT ASSIST AND DOES YARD WORK.  DECREASED HEARING IN BOTH EARS SINCE STROKE.   Vitamin D deficiency     SURGICAL HISTORY: Past Surgical History:  Procedure Laterality Date   CAROTID SURGERY 2009 -LEFT     COLON POLYP EXCISION     COLON SURGERY     COLONOSCOPY WITH PROPOFOL N/A 04/18/2017   Procedure: COLONOSCOPY WITH PROPOFOL;  Surgeon: Manya Silvas, MD;  [facility];  Service: Endoscopy;  Laterality: N/A;   ESOPHAGOGASTRODUODENOSCOPY     IR GENERIC HISTORICAL  01/21/2016   IR RADIOLOGIST  EVAL & MGMT 01/21/2016 Aletta Edouard, MD [facility] INTERV RAD   IR GENERIC HISTORICAL  10/22/2014   IR RADIOLOGIST EVAL & MGMT 10/22/2014 Aletta Edouard, MD [facility] INTERV RAD   IR IMAGING GUIDED PORT INSERTION  05/28/2022   IR RADIOLOGIST EVAL & MGMT  02/02/2017   IR RADIOLOGIST EVAL & MGMT  03/08/2018   percutaneous biopsy     VIDEO BRONCHOSCOPY WITH ENDOBRONCHIAL ULTRASOUND Right 05/12/2022   Procedure: VIDEO BRONCHOSCOPY WITH ENDOBRONCHIAL ULTRASOUND;  Surgeon: Tyler Pita, MD;  Location: [facility] ORS;  Service: Cardiopulmonary;  Laterality: Right;    SOCIAL HISTORY: Social History   Socioeconomic History   Marital status: Single    Spouse name: Not on file   Number of children: Not on file   Years of education: Not on file   Highest education level: Not on file  Occupational History   Not on file  Tobacco Use   Smoking status: Former    Packs/day: 1.00    Years: 35.00    Total pack years: 35.00    Types: Cigarettes    Quit date: 12/22/2011    Years since quitting: 10.4   Smokeless tobacco: Never  Vaping Use   Vaping Use: Never used  Substance and Sexual Activity   Alcohol use: Yes    Comment: occosionally    Drug use: No   Sexual activity: Not on file  Other Topics Concern   Not on file  Social History Narrative   Not on file   Social Determinants of Health   Financial Resource Strain: Low Risk  (11/28/2020)   Overall Financial Resource Strain (CARDIA)    Difficulty of Paying Living Expenses: Not hard at all  Food Insecurity: Not on file  Transportation Needs: Not on file  Physical Activity: Not on file  Stress: Not on file  Social Connections: Not on file  Intimate Partner Violence: Not on file    FAMILY HISTORY: Family History  Problem Relation Age of Onset   Colon polyps Sister     ALLERGIES:  has No Known Allergies.  MEDICATIONS:  Current Outpatient Medications  Medication Sig Dispense Refill   aspirin 81 MG tablet Take 81 mg by mouth daily.      atorvastatin (LIPITOR) 80 MG tablet Take 1 tablet (80 mg total) by mouth daily. (Patient taking differently: Take 80 mg by mouth at bedtime.) 90 tablet 3   budesonide-formoterol (SYMBICORT) 160-4.5 MCG/ACT inhaler Inhale 1 puff into the lungs 2 (two) times daily. 1 each 5   Cholecalciferol (VITAMIN D) 2000 UNITS CAPS Take by mouth. VITAMIN D 3 2000 IU  ONE SOFTGEL DAILY  --OTC     clopidogrel (PLAVIX) 75 MG tablet Take 1 tablet (75 mg total) by mouth daily. 90 tablet 3   enalapril (VASOTEC) 20 MG tablet Take 1 tablet (20 mg total) by mouth daily. 90 tablet 3  ezetimibe (ZETIA) 10 MG tablet Take 1 tablet (10 mg total) by mouth daily. 90 tablet 3   lidocaine-prilocaine (EMLA) cream Apply to affected area once 30 g 3   ondansetron (ZOFRAN) 8 MG tablet Take 1 tablet (8 mg total) by mouth every 8 (eight) hours as needed for nausea or vomiting. Start on the third day after chemotherapy. 30 tablet 1   OXYGEN Inhale into the lungs. At night     pantoprazole (PROTONIX) 40 MG tablet Take 1 tablet (40 mg total) by mouth daily. IN AM 90 tablet 1   phenytoin (DILANTIN) 100 MG ER capsule Take 4 capsules (400 mg total) by mouth at bedtime. 450 capsule 1   prochlorperazine (COMPAZINE) 10 MG tablet Take 1 tablet (10 mg total) by mouth every 6 (six) hours as needed for nausea or vomiting. 30 tablet 1   dexamethasone (DECADRON) 4 MG tablet Take 2 tablets daily for 2 days, start the day after chemotherapy. Take with food. (Patient not taking: Reported on 06/03/2022) 30 tablet 1   No current facility-administered medications for this visit.   Facility-Administered Medications Ordered in Other Visits  Medication Dose Route Frequency Provider Last Rate Last Admin   0.9 %  sodium chloride infusion   Intravenous Once [name], MD       CARBOplatin (PARAPLATIN) 200 mg in sodium chloride 0.9 % 100 mL chemo infusion  200 mg Intravenous Once [name], MD       dexamethasone (DECADRON) 10 mg in sodium chloride  0.9 % 50 mL IVPB  10 mg Intravenous Once [name], MD       diphenhydrAMINE (BENADRYL) injection 50 mg  50 mg Intravenous Once [name], MD       famotidine (PEPCID) IVPB 20 mg premix  20 mg Intravenous Once [name], MD       heparin lock flush 100 unit/mL  500 Units Intracatheter Once PRN [name], MD       PACLitaxel (TAXOL) 84 mg in sodium chloride 0.9 % 250 mL chemo infusion (</= 80mg /m2)  45 mg/m2 (Treatment Plan Recorded) Intravenous Once [name], MD       palonosetron (ALOXI) injection 0.25 mg  0.25 mg Intravenous Once [name], MD        REVIEW OF SYSTEMS:   Pertinent information mentioned in HPI All other systems were reviewed with the patient and are negative.  PHYSICAL EXAMINATION: ECOG PERFORMANCE STATUS: 2 - Symptomatic, <50% confined to bed  Vitals:   06/03/22 0903 06/03/22 0925  BP:  117/73  Resp: 18   Temp: 98.4 F (36.9 C)   SpO2: 97%    Filed Weights   06/03/22 0903  Weight: 166 lb (75.3 kg)    GENERAL:alert, no distress and comfortable SKIN: skin color, texture, turgor are normal, no rashes or significant lesions EYES: normal, conjunctiva are pink and non-injected, sclera clear OROPHARYNX:no exudate, no erythema and lips, buccal mucosa, and tongue normal  NECK: supple, thyroid normal size, non-tender, without nodularity LYMPH:  no palpable lymphadenopathy in the cervical, axillary or inguinal LUNGS: clear to auscultation and percussion with normal breathing effort HEART: regular rate & rhythm and no murmurs and no lower extremity edema ABDOMEN:abdomen soft, non-tender and normal bowel sounds Musculoskeletal:no cyanosis of digits and no clubbing  PSYCH: alert & oriented x 3 with fluent speech NEURO: no focal motor/sensory deficits  LABORATORY DATA:  I have reviewed the data as listed Lab Results  Component Value Date   WBC 4.6 06/03/2022  HGB 12.7 (L) 06/03/2022   HCT 36.1 (L) 06/03/2022   MCV 87.4  06/03/2022   PLT 414 (H) 06/03/2022   Recent Labs    12/07/21 0814 05/10/22 1019 06/03/22 0841  NA 143 140 134*  K 4.7 4.1 3.6  CL 103 103 100  CO2 26 29 26   GLUCOSE 94 86 89  BUN 8 7* 6*  CREATININE 0.65* 0.53* 0.53*  CALCIUM 10.0 9.5 8.8*  GFRNONAA  --  >60 >60  PROT 7.6  --  7.6  ALBUMIN 4.6  --  3.8  AST 17  --  16  ALT 14  --  12  ALKPHOS 120  --  116  BILITOT <0.2  --  0.8    RADIOGRAPHIC STUDIES: I have personally reviewed the radiological images as listed and agreed with the findings in the report. IR IMAGING GUIDED PORT INSERTION  Result Date: 05/28/2022 INDICATION: Lung malignancy EXAM: IMPLANTED PORT A CATH PLACEMENT WITH ULTRASOUND AND FLUOROSCOPIC GUIDANCE MEDICATIONS: None ANESTHESIA/SEDATION: Moderate (conscious) sedation was employed during this procedure. A total of Versed 1 mg and Fentanyl 50 mcg was administered intravenously by the radiology nurse. Total intra-service moderate Sedation Time: 34 minutes. The patient's level of consciousness and vital signs were monitored continuously by radiology nursing throughout the procedure under my direct supervision. FLUOROSCOPY: Radiation Exposure Index (as provided by the fluoroscopic device): 95.6 mGy Kerma COMPLICATIONS: None immediate. PROCEDURE: The procedure, risks, benefits, and alternatives were explained to the patient. Questions regarding the procedure were encouraged and answered. The patient understands and consents to the procedure. A timeout was performed prior to the initiation of the procedure. Patient positioned supine on the angiography table. Right neck and anterior upper chest prepped and draped in the usual sterile fashion. All elements of maximal sterile barrier were utilized including, cap, mask, sterile gown, sterile gloves, large sterile drape, hand scrubbing and 2% Chlorhexidine for skin cleaning. The right internal jugular vein was evaluated with ultrasound and shown to be patent. A permanent  ultrasound image was obtained and placed in the patient's medical record. Local anesthesia was provided with 1% lidocaine with epinephrine. Using sterile gel and a sterile probe cover, the right internal jugular vein was entered with a 21 ga needle during real time ultrasound guidance. 0.018 inch guidewire placed and 21 ga needle exchanged for transitional dilator set. Utilizing fluoroscopy, 0.035 inch guidewire advanced to the IVC without difficulty. Attention then turned to the right anterior upper chest. Following local lidocaine administration, a port pocket was created. The catheter was connected to the port and brought from the pocket to the venotomy site through a subcutaneous tunnel. The catheter was cut to size and inserted through the peel-away sheath. However, the catheter tip only extended to the mid SVC. The catheter was removed over 0.035 inch stiff Glidewire. New catheter was cut 3 cm longer. Peel-away sheath was placed through the tunnel into the right internal jugular vein and the new catheter was inserted through the peel-away sheath. The tip was positioned at the cavoatrial junction. The catheter was then connected to the port. The port was placed in the pocket. The port aspirated and flushed well. The port pocket was closed with deep and superficial absorbable suture. The port pocket incision and venotomy sites were also sealed with Dermabond. IMPRESSION: Right IJ chest port is ready for use. Electronically Signed   By: Miachel Roux M.D.   On: 05/28/2022 13:02   MR Brain W Wo Contrast  Result Date: 05/25/2022 CLINICAL DATA:  Non-small cell lung cancer, evaluate for metastases EXAM: MRI HEAD WITHOUT AND WITH CONTRAST TECHNIQUE: Multiplanar, multiecho pulse sequences of the brain and surrounding structures were obtained without and with intravenous contrast. CONTRAST:  7.5 mL Vueway COMPARISON:  MRI head 04/22/2008 FINDINGS: Brain: No abnormal parenchymal or meningeal enhancement. No restricted  diffusion to suggest acute or subacute infarct. No acute hemorrhage, mass, mass effect, or midline shift. No hydrocephalus or extra-axial collection. Encephalomalacia in the bilateral posterior temporal, lateral occipital, and inferior parietal lobes, sequela of remote infarcts. Sequela of remote infarcts in the left basal ganglia and left frontal white matter. Vascular: Normal arterial flow voids. Normal arterial and venous enhancement. Skull and upper cervical spine: Small enhancing foci in the right parietal calvarium (series 18, image 132 and 135) and right occipital calvarium (series 18, image 83). Otherwise normal marrow signal. Sinuses/Orbits: Mucous retention cyst in the left maxillary sinus. Otherwise clear paranasal sinuses. The orbits are unremarkable. Other: Trace fluid in the right mastoid air cells. IMPRESSION: 1. No evidence of metastatic disease in the brain. 2. Small enhancing foci in the right parietal and right occipital calvarium, which are indeterminate but could represent small osseous metastases. Attention on follow-up. Electronically Signed   By: Merilyn Baba M.D.   On: 05/25/2022 19:26   DG Chest Port 1 View  Result Date: 05/12/2022 CLINICAL DATA:  Status post lung biopsy. EXAM: PORTABLE CHEST 1 VIEW COMPARISON:  March 18, 2022.  April 08, 2022. FINDINGS: The heart size and mediastinal contours are within normal limits. Left lung is clear. Right upper lobe opacity is noted consistent with central obstructive malignancy and postobstructive atelectasis. No definite pneumothorax is noted. The visualized skeletal structures are unremarkable. IMPRESSION: No definite pneumothorax is seen. Right upper lobe opacity is noted consistent with central obstructive malignancy and postobstructive atelectasis. Electronically Signed   By: Marijo Conception M.D.   On: 05/12/2022 14:40

## 2022-06-04 ENCOUNTER — Telehealth: Payer: Self-pay

## 2022-06-04 ENCOUNTER — Other Ambulatory Visit: Payer: Self-pay

## 2022-06-04 ENCOUNTER — Ambulatory Visit
Admission: RE | Admit: 2022-06-04 | Discharge: 2022-06-04 | Disposition: A | Discharge status: Home / Self Care | Payer: Medicare Other | Source: Ambulatory Visit | Attending: Radiation Oncology | Admitting: Radiation Oncology

## 2022-06-04 ENCOUNTER — Ambulatory Visit: Payer: Medicare Other

## 2022-06-04 DIAGNOSIS — Z510 Encounter for antineoplastic radiation therapy: Secondary | ICD-10-CM | POA: Diagnosis not present

## 2022-06-04 DIAGNOSIS — Z87891 Personal history of nicotine dependence: Secondary | ICD-10-CM | POA: Diagnosis not present

## 2022-06-04 DIAGNOSIS — C3411 Malignant neoplasm of upper lobe, right bronchus or lung: Secondary | ICD-10-CM | POA: Diagnosis not present

## 2022-06-04 DIAGNOSIS — Z5111 Encounter for antineoplastic chemotherapy: Secondary | ICD-10-CM | POA: Diagnosis not present

## 2022-06-04 LAB — RAD ONC ARIA SESSION SUMMARY
Course Elapsed Days: 1
Plan Fractions Treated to Date: 2
Plan Prescribed Dose Per Fraction: 2 Gy
Plan Total Fractions Prescribed: 35
Plan Total Prescribed Dose: 70 Gy
Reference Point Dosage Given to Date: 4 Gy
Reference Point Session Dosage Given: 2 Gy
Session Number: 2

## 2022-06-04 NOTE — Telephone Encounter (Signed)
Telephone call to patient for follow up after receiving first infusion.   Patient states infusion went great.  States eating good and drinking plenty of fluids.   Denies any nausea or vomiting.  Encouraged patient to call for any concerns or questions. 

## 2022-06-07 ENCOUNTER — Ambulatory Visit: Payer: Medicare Other

## 2022-06-07 ENCOUNTER — Ambulatory Visit: Payer: Medicare Other | Admitting: Internal Medicine

## 2022-06-08 ENCOUNTER — Other Ambulatory Visit: Payer: Self-pay

## 2022-06-08 ENCOUNTER — Ambulatory Visit: Payer: Medicare Other

## 2022-06-08 ENCOUNTER — Other Ambulatory Visit: Payer: Self-pay | Admitting: *Deleted

## 2022-06-08 ENCOUNTER — Ambulatory Visit
Admission: RE | Admit: 2022-06-08 | Discharge: 2022-06-08 | Disposition: A | Discharge status: Home / Self Care | Payer: Medicare Other | Source: Ambulatory Visit | Attending: Radiation Oncology | Admitting: Radiation Oncology

## 2022-06-08 DIAGNOSIS — C3411 Malignant neoplasm of upper lobe, right bronchus or lung: Secondary | ICD-10-CM | POA: Diagnosis not present

## 2022-06-08 DIAGNOSIS — Z5111 Encounter for antineoplastic chemotherapy: Secondary | ICD-10-CM | POA: Diagnosis not present

## 2022-06-08 DIAGNOSIS — C3491 Malignant neoplasm of unspecified part of right bronchus or lung: Secondary | ICD-10-CM

## 2022-06-08 DIAGNOSIS — Z510 Encounter for antineoplastic radiation therapy: Secondary | ICD-10-CM | POA: Diagnosis not present

## 2022-06-08 DIAGNOSIS — Z87891 Personal history of nicotine dependence: Secondary | ICD-10-CM | POA: Diagnosis not present

## 2022-06-08 LAB — RAD ONC ARIA SESSION SUMMARY
Course Elapsed Days: 5
Plan Fractions Treated to Date: 3
Plan Prescribed Dose Per Fraction: 2 Gy
Plan Total Fractions Prescribed: 35
Plan Total Prescribed Dose: 70 Gy
Reference Point Dosage Given to Date: 6 Gy
Reference Point Session Dosage Given: 2 Gy
Session Number: 3

## 2022-06-09 ENCOUNTER — Ambulatory Visit: Payer: Medicare Other

## 2022-06-09 ENCOUNTER — Inpatient Hospital Stay: Payer: Medicare Other | Admitting: Hospice and Palliative Medicine

## 2022-06-09 ENCOUNTER — Ambulatory Visit
Admission: RE | Admit: 2022-06-09 | Discharge: 2022-06-09 | Disposition: A | Discharge status: Home / Self Care | Payer: Medicare Other | Source: Ambulatory Visit | Attending: Radiation Oncology | Admitting: Radiation Oncology

## 2022-06-09 ENCOUNTER — Other Ambulatory Visit: Payer: Self-pay

## 2022-06-09 DIAGNOSIS — C3411 Malignant neoplasm of upper lobe, right bronchus or lung: Secondary | ICD-10-CM | POA: Diagnosis not present

## 2022-06-09 DIAGNOSIS — Z5111 Encounter for antineoplastic chemotherapy: Secondary | ICD-10-CM | POA: Diagnosis not present

## 2022-06-09 DIAGNOSIS — Z87891 Personal history of nicotine dependence: Secondary | ICD-10-CM | POA: Diagnosis not present

## 2022-06-09 DIAGNOSIS — C3491 Malignant neoplasm of unspecified part of right bronchus or lung: Secondary | ICD-10-CM

## 2022-06-09 DIAGNOSIS — Z510 Encounter for antineoplastic radiation therapy: Secondary | ICD-10-CM | POA: Diagnosis not present

## 2022-06-09 LAB — RAD ONC ARIA SESSION SUMMARY
Course Elapsed Days: 6
Plan Fractions Treated to Date: 4
Plan Prescribed Dose Per Fraction: 2 Gy
Plan Total Fractions Prescribed: 35
Plan Total Prescribed Dose: 70 Gy
Reference Point Dosage Given to Date: 8 Gy
Reference Point Session Dosage Given: 2 Gy
Session Number: 4

## 2022-06-09 NOTE — Progress Notes (Signed)
Multidisciplinary Oncology Council Documentation  Derek Webb was presented by our [facility] on 06/09/2022, which included representatives from:  Palliative Care Dietitian  Physical/Occupational Therapist Nurse Navigator Genetics Speech Therapist Social work Survivorship RN Financial Navigator Research RN   Derek Webb currently presents with history of lung cancer  We reviewed previous medical and familial history, history of present illness, and recent lab results along with all available histopathologic and imaging studies. The [facility] considered available treatment options and made the following recommendations/referrals:  SW, nutrition  The MOC is a meeting of clinicians from various specialty areas who evaluate and discuss patients for whom a multidisciplinary approach is being considered. Final determinations in the plan of care are those of the provider(s).   Today's extended care, comprehensive team conference, Derek Webb was not present for the discussion and was not examined.

## 2022-06-10 ENCOUNTER — Inpatient Hospital Stay (HOSPITAL_BASED_OUTPATIENT_CLINIC_OR_DEPARTMENT_OTHER): Payer: Medicare Other | Admitting: Internal Medicine

## 2022-06-10 ENCOUNTER — Encounter: Payer: Self-pay | Admitting: Internal Medicine

## 2022-06-10 ENCOUNTER — Other Ambulatory Visit: Payer: Self-pay

## 2022-06-10 ENCOUNTER — Inpatient Hospital Stay: Payer: Medicare Other

## 2022-06-10 ENCOUNTER — Ambulatory Visit: Payer: Medicare Other

## 2022-06-10 ENCOUNTER — Ambulatory Visit
Admission: RE | Admit: 2022-06-10 | Discharge: 2022-06-10 | Disposition: A | Discharge status: Home / Self Care | Payer: Medicare Other | Source: Ambulatory Visit | Attending: Radiation Oncology | Admitting: Radiation Oncology

## 2022-06-10 VITALS — BP 94/69 | Temp 97.9°F | Resp 16 | Wt 168.0 lb

## 2022-06-10 VITALS — HR 61

## 2022-06-10 DIAGNOSIS — C3491 Malignant neoplasm of unspecified part of right bronchus or lung: Secondary | ICD-10-CM

## 2022-06-10 DIAGNOSIS — Z510 Encounter for antineoplastic radiation therapy: Secondary | ICD-10-CM | POA: Diagnosis not present

## 2022-06-10 DIAGNOSIS — Z5111 Encounter for antineoplastic chemotherapy: Secondary | ICD-10-CM

## 2022-06-10 DIAGNOSIS — C3411 Malignant neoplasm of upper lobe, right bronchus or lung: Secondary | ICD-10-CM | POA: Diagnosis not present

## 2022-06-10 DIAGNOSIS — Z87891 Personal history of nicotine dependence: Secondary | ICD-10-CM | POA: Diagnosis not present

## 2022-06-10 LAB — COMPREHENSIVE METABOLIC PANEL
ALT: 10 U/L (ref 0–44)
AST: 15 U/L (ref 15–41)
Albumin: 3.4 g/dL — ABNORMAL LOW (ref 3.5–5.0)
Alkaline Phosphatase: 110 U/L (ref 38–126)
Anion gap: 9 (ref 5–15)
BUN: 8 mg/dL (ref 8–23)
CO2: 28 mmol/L (ref 22–32)
Calcium: 9 mg/dL (ref 8.9–10.3)
Chloride: 97 mmol/L — ABNORMAL LOW (ref 98–111)
Creatinine, Ser: 0.58 mg/dL — ABNORMAL LOW (ref 0.61–1.24)
GFR, Estimated: >60 mL/min (ref >60)
Glucose, Bld: 106 mg/dL — ABNORMAL HIGH (ref 70–99)
Potassium: 3.6 mmol/L (ref 3.5–5.1)
Sodium: 134 mmol/L — ABNORMAL LOW (ref 135–145)
Total Bilirubin: 0.5 mg/dL (ref 0.3–1.2)
Total Protein: 7.1 g/dL (ref 6.5–8.1)

## 2022-06-10 LAB — CBC WITH DIFFERENTIAL/PLATELET
Abs Immature Granulocytes: 0.03 10*3/uL (ref 0.00–0.07)
Basophils Absolute: 0.1 10*3/uL (ref 0.0–0.1)
Basophils Relative: 1 %
Eosinophils Absolute: 0.1 10*3/uL (ref 0.0–0.5)
Eosinophils Relative: 3 %
HCT: 33.5 % — ABNORMAL LOW (ref 39.0–52.0)
Hemoglobin: 11.4 g/dL — ABNORMAL LOW (ref 13.0–17.0)
Immature Granulocytes: 1 %
Lymphocytes Relative: 21 %
Lymphs Abs: 1 10*3/uL (ref 0.7–4.0)
MCH: 30.7 pg (ref 26.0–34.0)
MCHC: 34 g/dL (ref 30.0–36.0)
MCV: 90.3 fL (ref 80.0–100.0)
Monocytes Absolute: 0.6 10*3/uL (ref 0.1–1.0)
Monocytes Relative: 14 %
Neutro Abs: 2.8 10*3/uL (ref 1.7–7.7)
Neutrophils Relative %: 60 %
Platelets: 363 10*3/uL (ref 150–400)
RBC: 3.71 MIL/uL — ABNORMAL LOW (ref 4.22–5.81)
RDW: 13.1 % (ref 11.5–15.5)
WBC: 4.6 10*3/uL (ref 4.0–10.5)
nRBC: 0 % (ref 0.0–0.2)

## 2022-06-10 LAB — RAD ONC ARIA SESSION SUMMARY
Course Elapsed Days: 7
Plan Fractions Treated to Date: 5
Plan Prescribed Dose Per Fraction: 2 Gy
Plan Total Fractions Prescribed: 35
Plan Total Prescribed Dose: 70 Gy
Reference Point Dosage Given to Date: 10 Gy
Reference Point Session Dosage Given: 2 Gy
Session Number: 5

## 2022-06-10 MED ORDER — SODIUM CHLORIDE 0.9 % IV SOLN
45.0000 mg/m2 | Freq: Once | INTRAVENOUS | Status: AC
  Administered 2022-06-10: 84 mg via INTRAVENOUS
  Filled 2022-06-10: qty 14

## 2022-06-10 MED ORDER — SODIUM CHLORIDE 0.9 % IV SOLN
10.0000 mg | Freq: Once | INTRAVENOUS | Status: AC
  Administered 2022-06-10: 10 mg via INTRAVENOUS
  Filled 2022-06-10: qty 10

## 2022-06-10 MED ORDER — HEPARIN SOD (PORK) LOCK FLUSH 100 UNIT/ML IV SOLN
500.0000 [IU] | Freq: Once | INTRAVENOUS | Status: AC | PRN
  Administered 2022-06-10: 500 [IU]
  Filled 2022-06-10: qty 5

## 2022-06-10 MED ORDER — DIPHENHYDRAMINE HCL 50 MG/ML IJ SOLN
50.0000 mg | Freq: Once | INTRAMUSCULAR | Status: AC
  Administered 2022-06-10: 50 mg via INTRAVENOUS
  Filled 2022-06-10: qty 1

## 2022-06-10 MED ORDER — PALONOSETRON HCL INJECTION 0.25 MG/5ML
0.2500 mg | Freq: Once | INTRAVENOUS | Status: AC
  Administered 2022-06-10: 0.25 mg via INTRAVENOUS
  Filled 2022-06-10: qty 5

## 2022-06-10 MED ORDER — SODIUM CHLORIDE 0.9 % IV SOLN
Freq: Once | INTRAVENOUS | Status: AC
  Filled 2022-06-10: qty 250

## 2022-06-10 MED ORDER — SODIUM CHLORIDE 0.9 % IV SOLN
200.8000 mg | Freq: Once | INTRAVENOUS | Status: AC
  Administered 2022-06-10: 200 mg via INTRAVENOUS
  Filled 2022-06-10: qty 20

## 2022-06-10 MED ORDER — FAMOTIDINE IN NACL 20-0.9 MG/50ML-% IV SOLN
20.0000 mg | Freq: Once | INTRAVENOUS | Status: AC
  Administered 2022-06-10: 20 mg via INTRAVENOUS
  Filled 2022-06-10: qty 50

## 2022-06-10 NOTE — Progress Notes (Signed)
Enrolled patient into the [facility].

## 2022-06-10 NOTE — Patient Instructions (Signed)
[facility] [place]  Discharge Instructions: Thank you for choosing [facility] to provide your oncology and hematology care.  If you have a lab appointment with the [facility], please go directly to the [facility] and check in at the registration area.  Wear comfortable clothing and clothing appropriate for easy access to any Portacath or PICC line.   We strive to give you quality time with your provider. You may need to reschedule your appointment if you arrive late (15 or more minutes).  Arriving late affects you and other patients whose appointments are after yours.  Also, if you miss three or more appointments without notifying the office, you may be dismissed from the clinic at the provider's discretion.      For prescription refill requests, have your pharmacy contact our office and allow 72 hours for refills to be completed.    Today you received the following chemotherapy and/or immunotherapy agents: Taxol, Carboplatin      To help prevent nausea and vomiting after your treatment, we encourage you to take your nausea medication as directed.  BELOW ARE SYMPTOMS THAT SHOULD BE REPORTED IMMEDIATELY: *FEVER GREATER THAN 100.4 F (38 C) OR HIGHER *CHILLS OR SWEATING *NAUSEA AND VOMITING THAT IS NOT CONTROLLED WITH YOUR NAUSEA MEDICATION *UNUSUAL SHORTNESS OF BREATH *UNUSUAL BRUISING OR BLEEDING *URINARY PROBLEMS (pain or burning when urinating, or frequent urination) *BOWEL PROBLEMS (unusual diarrhea, constipation, pain near the anus) TENDERNESS IN MOUTH AND THROAT WITH OR WITHOUT PRESENCE OF ULCERS (sore throat, sores in mouth, or a toothache) UNUSUAL RASH, SWELLING OR PAIN  UNUSUAL VAGINAL DISCHARGE OR ITCHING   Items with * indicate a potential emergency and should be followed up as soon as possible or go to the Emergency Department if any problems should occur.  Please show the CHEMOTHERAPY ALERT CARD or IMMUNOTHERAPY ALERT CARD at  check-in to the Emergency Department and triage nurse.  Should you have questions after your visit or need to cancel or reschedule your appointment, please contact [facility] [place] [facility] [place]  [phone] and follow the prompts.  Office hours are 8:00 a.m. to 4:30 p.m. Monday - Friday. Please note that voicemails left after 4:00 p.m. may not be returned until the following business day.  We are closed weekends and major holidays. You have access to a nurse at all times for urgent questions. Please call the main number to the clinic [phone] and follow the prompts.  For any non-urgent questions, you may also contact your provider using MyChart. We now offer e-Visits for anyone 69 and older to request care online for non-urgent symptoms. For details visit [email or web address].   Also download the MyChart app! Go to the app store, search "MyChart", open the app, select [facility], and log in with your MyChart username and password.  Masks are optional in the cancer centers. If you would like for your care team to wear a mask while they are taking care of you, please let them know. For doctor visits, patients may have with them one support person who is at least 69 years old. At this time, visitors are not allowed in the infusion area.

## 2022-06-10 NOTE — Progress Notes (Signed)
Pt in for follow up and treatment today.  

## 2022-06-10 NOTE — Progress Notes (Signed)
[facility] CONSULT NOTE  Patient Care Team: [name], Ricki Rodriguez as PCP - General (Physician Assistant) Alena Bills, [facility] as Pharmacist (Pharmacist) Telford Nab, RN as Oncology Nurse Navigator  REFERRING PROVIDER: Dr. Patsey Berthold  REASON FOR REFFERAL: newly diagnosed SCCa of right lung  CANCER STAGING   Cancer Staging  Squamous cell lung cancer [facility]) Staging form: Lung, AJCC 7th Edition - Clinical: Stage IIIA (T4, N0, M0) - Signed by [name], MD on 06/03/2022  CURRENT TREATMENT- Weekly Carboplatin and Taxol with RT started 06/03/2022.   ASSESSMENT & PLAN:  Derek Webb 71 y.o. 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  #RUL of lung SCCa, atleast Stage IIIA (cT4N0M0) #Encounter for antineoplastic chemotherapy -PET CT scan was personally reviewed.  There is a large 4 cm mass in the right upper lobe partially obstructing the right upper lobe bronchus, involving the hilum and subcarinal space with postobstructive pneumonia. No lymphadenopathy.   -s/p bronchoscopy with EBUS by Dr. Patsey Berthold on 05/12/2022. -MRI brain on 05/22/2022 personally reviewed.  No evidence of metastatic disease in the brain. Small enhancing foci in the right parietal and right occipital calvarium, which are indeterminate but could represent small osseous metastases. Attention on follow-up. - not a surgical candidate  -Labs reviewed and nonactionable.  Proceed with cycle 2 of carboplatin AUC 2 and paclitaxel 45 mg/m2 today concurrent with radiation with Dr. Donella Stade.  He has antiemetics Zofran and Compazine.  Continue with Decadron for 2 days after chemo.  Has Emla cream.  Discussed about keeping thermometer at home and to report for temperature more than 100.4.   # Access - port placed on  05/28/2022   Orders Placed This Encounter  Procedures   CBC with Differential    Standing Status:   Future    Standing Expiration Date:   06/18/2023   Comprehensive metabolic panel    Standing Status:   Future    Standing Expiration Date:   06/18/2023   RTC in 1 week for md visit, labs, c3 carbo/taxol   The total time spent in the appointment was 30 minutes encounter with patients including review of chart and various tests results, discussions about plan of care and coordination of care plan   All questions were answered. The patient knows to call the clinic with any problems, questions or concerns. No barriers to learning was detected.  [name], MD 10/19/202310:45 AM   HISTORY OF PRESENTING ILLNESS:  Derek Webb 71 y.o. 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  Patient was seen today accompanied by her Sister [name].  Denies any cough, hemoptysis, shortness of breath and weight loss. Remote smoker.  Quit in 2013. Has family history of lung cancer in father.  INTERVAL HISTORY-  Patient seen today prior to weekly CarboTaxol. He tolerated cycle 1 well. Patient denies fever, chills, nausea, vomiting, shortness of breath, cough, abdominal pain, bleeding, bowel or bladder issues. Energy level is good.  Appetite is good.  Denies any weight loss. Denies pain.   I have reviewed his chart and materials related to his cancer extensively and collaborated history with the patient. Summary of oncologic  history is as follows: Oncology History Overview Note  History of left upper lobe lung SCC status post microwave thermal ablation in 2013 by Dr. Kathlene Cote.  He was deemed not a surgical candidate and had transportation issues for radiation treatment.   Squamous cell lung  cancer ([place])  01/12/2022 Initial Diagnosis   Patient has been following with Dr. Humphrey Rolls of pulmonary for suspicious lung nodule. CT chest in March 2021 showed post ablation scarring in LUL not changed and increased atelectasis of RML and RUL. He did not follow through.   CT chest in 01/12/2022 showed  New abrupt and slightly irregular cut off of the right upper lobe bronchus with associated right upper lobe atelectasis and postobstructive changes. Findings are concerning for endobronchial malignancy    04/08/2022 PET scan   IMPRESSION: 1. Hypermetabolic central right upper lobe lung mass nearly occluding the right upper lobe bronchus, poorly delineated on the noncontrast CT images, measuring approximately 3.8 x 2.9 cm, compatible with primary bronchogenic malignancy. 2. Postobstructive pneumonia/atelectasis throughout the basilar right upper lobe. 3. No hypermetabolic metastatic thoracic adenopathy or distant metastatic disease. 4. Stable mild post ablation change in the left upper lobe with no metabolic evidence of local tumor recurrence in this location. 5. Dilated 4.4 cm ascending thoracic aorta. Recommend annual imaging followup by CTA or MRA   05/12/2022 Procedure   Procedure was delayed due to miscommunication about holding Plavix. S/p bronchoscopy with EBUS by Dr. Patsey Berthold. Op note mentions significant bulky adenopathy in the subcarinal space and in the right hilum.    05/12/2022 Pathology Results   DIAGNOSIS:  A.  LUNG, RIGHT UPPER LOBE; ENB-ASSISTED BIOPSY:  - SQUAMOUS CELL CARCINOMA.   LUNG, RIGHT UPPER LOBE; EBUS-ASSISTED BRUSHING:  - POSITIVE FOR MALIGNANCY.  - SQUAMOUS CELL CARCINOMA IS PRESENT.   LUNG, RIGHT UPPER LOBE; EBUS-ASSISTED LAVAGE:  - POSITIVE FOR MALIGNANCY.  - SQUAMOUS CELL CARCINOMA IS PRESENT  SUBCARINAL SPACE, RIGHT; EBUS-ASSISTED FNA:  - POSITIVE FOR MALIGNANCY.  - SQUAMOUS CELL CARCINOMA IS PRESENT   05/18/2022 Cancer Staging   Staging form:  Lung, AJCC 7th Edition - Clinical: Stage IIIA (T4, N0, M0) - Signed by [name], MD on 06/03/2022   05/21/2022 Imaging   MRI brain  No evidence of metastatic disease in the brain. 2. Small enhancing foci in the right parietal and right occipital calvarium, which are indeterminate but could represent small osseous metastases. Attention on follow-up   06/03/2022 -  Chemotherapy   Patient is on Treatment Plan : LUNG Carboplatin + Paclitaxel + XRT q7d       MEDICAL HISTORY:  Past Medical History:  Diagnosis Date   Alcoholism ([place])    COPD (chronic obstructive pulmonary disease) (HCC)    SEVERE COPD -OCCASIONALLY USES OXYGEN AT NIGHT-DOES NOT USE INHALERS ON REGULAR BASIS   Elevated cholesterol    GERD (gastroesophageal reflux disease)    HOH (hard of hearing)    Hypertension    Lung cancer ([place])    LEFT UPPER LUNG CARCINOMA-NOT A CANDIDATE FOR SURGICAL RESECTION BECAUSE OF HIS COPD AND POOR CANDIDATE FOR RADIATION BECAUSE OF TRANSPORTATION PROBLEMS   Seizures ([place])    CHRONIC DILATIN - PT STATES NO SEIZURES IN PAST COUPLE OF YEARS; PT STATES HIS SEIZURES CAUSED NUMBNESS OF ARMS AND HANDS AND NOT ABLE TO MOVE HIS ARMS   Stroke ([place])    2 TO 3 YRS AGO-AFFECTED HIS SPEECH--ABLE TO AMBULATE WITHOUT ASSIST AND DOES YARD WORK.  DECREASED HEARING IN BOTH EARS SINCE STROKE.  Vitamin D deficiency     SURGICAL HISTORY: Past Surgical History:  Procedure Laterality Date   CAROTID SURGERY 2009 -LEFT     COLON POLYP EXCISION     COLON SURGERY     COLONOSCOPY WITH PROPOFOL N/A 04/18/2017   Procedure: COLONOSCOPY WITH PROPOFOL;  Surgeon: Manya Silvas, MD;  Location: [facility]COPY;  Service: Endoscopy;  Laterality: N/A;   ESOPHAGOGASTRODUODENOSCOPY     IR GENERIC HISTORICAL  01/21/2016   IR RADIOLOGIST EVAL & MGMT 01/21/2016 Aletta Edouard, MD [facility] INTERV RAD   IR GENERIC HISTORICAL  10/22/2014   IR RADIOLOGIST EVAL & MGMT 10/22/2014 Aletta Edouard, MD [facility] INTERV RAD   IR IMAGING  GUIDED PORT INSERTION  05/28/2022   IR RADIOLOGIST EVAL & MGMT  02/02/2017   IR RADIOLOGIST EVAL & MGMT  03/08/2018   percutaneous biopsy     VIDEO BRONCHOSCOPY WITH ENDOBRONCHIAL ULTRASOUND Right 05/12/2022   Procedure: VIDEO BRONCHOSCOPY WITH ENDOBRONCHIAL ULTRASOUND;  Surgeon: Tyler Pita, MD;  Location: [facility] ORS;  Service: Cardiopulmonary;  Laterality: Right;    SOCIAL HISTORY: Social History   Socioeconomic History   Marital status: Single    Spouse name: Not on file   Number of children: Not on file   Years of education: Not on file   Highest education level: Not on file  Occupational History   Not on file  Tobacco Use   Smoking status: Former    Packs/day: 1.00    Years: 35.00    Total pack years: 35.00    Types: Cigarettes    Quit date: 12/22/2011    Years since quitting: 10.4   Smokeless tobacco: Never  Vaping Use   Vaping Use: Never used  Substance and Sexual Activity   Alcohol use: Yes    Comment: occosionally    Drug use: No   Sexual activity: Not on file  Other Topics Concern   Not on file  Social History Narrative   Not on file   Social Determinants of Health   Financial Resource Strain: Low Risk  (11/28/2020)   Overall Financial Resource Strain (CARDIA)    Difficulty of Paying Living Expenses: Not hard at all  Food Insecurity: Not on file  Transportation Needs: Not on file  Physical Activity: Not on file  Stress: Not on file  Social Connections: Not on file  Intimate Partner Violence: Not on file    FAMILY HISTORY: Family History  Problem Relation Age of Onset   Colon polyps Sister     ALLERGIES:  has No Known Allergies.  MEDICATIONS:  Current Outpatient Medications  Medication Sig Dispense Refill   aspirin 81 MG tablet Take 81 mg by mouth daily.     atorvastatin (LIPITOR) 80 MG tablet Take 1 tablet (80 mg total) by mouth daily. (Patient taking differently: Take 80 mg by mouth at bedtime.) 90 tablet 3   budesonide-formoterol (SYMBICORT)  160-4.5 MCG/ACT inhaler Inhale 1 puff into the lungs 2 (two) times daily. 1 each 5   Cholecalciferol (VITAMIN D) 2000 UNITS CAPS Take by mouth. VITAMIN D 3 2000 IU  ONE SOFTGEL DAILY  --OTC     clopidogrel (PLAVIX) 75 MG tablet Take 1 tablet (75 mg total) by mouth daily. 90 tablet 3   dexamethasone (DECADRON) 4 MG tablet Take 2 tablets daily for 2 days, start the day after chemotherapy. Take with food. 30 tablet 1   enalapril (VASOTEC) 20 MG tablet Take 1 tablet (20 mg total) by mouth daily. 90 tablet 3  ezetimibe (ZETIA) 10 MG tablet Take 1 tablet (10 mg total) by mouth daily. 90 tablet 3   lidocaine-prilocaine (EMLA) cream Apply to affected area once 30 g 3   ondansetron (ZOFRAN) 8 MG tablet Take 1 tablet (8 mg total) by mouth every 8 (eight) hours as needed for nausea or vomiting. Start on the third day after chemotherapy. 30 tablet 1   OXYGEN Inhale into the lungs. At night     pantoprazole (PROTONIX) 40 MG tablet Take 1 tablet (40 mg total) by mouth daily. IN AM 90 tablet 1   phenytoin (DILANTIN) 100 MG ER capsule Take 4 capsules (400 mg total) by mouth at bedtime. 450 capsule 1   prochlorperazine (COMPAZINE) 10 MG tablet Take 1 tablet (10 mg total) by mouth every 6 (six) hours as needed for nausea or vomiting. 30 tablet 1   No current facility-administered medications for this visit.    REVIEW OF SYSTEMS:   Pertinent information mentioned in HPI All other systems were reviewed with the patient and are negative.  PHYSICAL EXAMINATION: ECOG PERFORMANCE STATUS: 2 - Symptomatic, <50% confined to bed  Vitals:   06/10/22 1019  BP: 94/69  Resp: 16  Temp: 97.9 F (36.6 C)  SpO2: 96%   Filed Weights   06/10/22 1019  Weight: 168 lb (76.2 kg)    GENERAL:alert, no distress and comfortable SKIN: skin color, texture, turgor are normal, no rashes or significant lesions EYES: normal, conjunctiva are pink and non-injected, sclera clear OROPHARYNX:no exudate, no erythema and lips, buccal  mucosa, and tongue normal  NECK: supple, thyroid normal size, non-tender, without nodularity LYMPH:  no palpable lymphadenopathy in the cervical, axillary or inguinal LUNGS: clear to auscultation and percussion with normal breathing effort HEART: regular rate & rhythm and no murmurs and no lower extremity edema ABDOMEN:abdomen soft, non-tender and normal bowel sounds Musculoskeletal:no cyanosis of digits and no clubbing  PSYCH: alert & oriented x 3 with fluent speech NEURO: no focal motor/sensory deficits  LABORATORY DATA:  I have reviewed the data as listed Lab Results  Component Value Date   WBC 4.6 06/10/2022   HGB 11.4 (L) 06/10/2022   HCT 33.5 (L) 06/10/2022   MCV 90.3 06/10/2022   PLT 363 06/10/2022   Recent Labs    12/07/21 0814 05/10/22 1019 06/03/22 0841 06/10/22 0959  NA 143 140 134* 134*  K 4.7 4.1 3.6 3.6  CL 103 103 100 97*  CO2 26 29 26 28   GLUCOSE 94 86 89 106*  BUN 8 7* 6* 8  CREATININE 0.65* 0.53* 0.53* 0.58*  CALCIUM 10.0 9.5 8.8* 9.0  GFRNONAA  --  >60 >60 >60  PROT 7.6  --  7.6 7.1  ALBUMIN 4.6  --  3.8 3.4*  AST 17  --  16 15  ALT 14  --  12 10  ALKPHOS 120  --  116 110  BILITOT <0.2  --  0.8 0.5    RADIOGRAPHIC STUDIES: I have personally reviewed the radiological images as listed and agreed with the findings in the report. IR IMAGING GUIDED PORT INSERTION  Result Date: 05/28/2022 INDICATION: Lung malignancy EXAM: IMPLANTED PORT A CATH PLACEMENT WITH ULTRASOUND AND FLUOROSCOPIC GUIDANCE MEDICATIONS: None ANESTHESIA/SEDATION: Moderate (conscious) sedation was employed during this procedure. A total of Versed 1 mg and Fentanyl 50 mcg was administered intravenously by the radiology nurse. Total intra-service moderate Sedation Time: 34 minutes. The patient's level of consciousness and vital signs were monitored continuously by radiology nursing throughout the procedure  under my direct supervision. FLUOROSCOPY: Radiation Exposure Index (as provided by  the fluoroscopic device): 62.9 mGy Kerma COMPLICATIONS: None immediate. PROCEDURE: The procedure, risks, benefits, and alternatives were explained to the patient. Questions regarding the procedure were encouraged and answered. The patient understands and consents to the procedure. A timeout was performed prior to the initiation of the procedure. Patient positioned supine on the angiography table. Right neck and anterior upper chest prepped and draped in the usual sterile fashion. All elements of maximal sterile barrier were utilized including, cap, mask, sterile gown, sterile gloves, large sterile drape, hand scrubbing and 2% Chlorhexidine for skin cleaning. The right internal jugular vein was evaluated with ultrasound and shown to be patent. A permanent ultrasound image was obtained and placed in the patient's medical record. Local anesthesia was provided with 1% lidocaine with epinephrine. Using sterile gel and a sterile probe cover, the right internal jugular vein was entered with a 21 ga needle during real time ultrasound guidance. 0.018 inch guidewire placed and 21 ga needle exchanged for transitional dilator set. Utilizing fluoroscopy, 0.035 inch guidewire advanced to the IVC without difficulty. Attention then turned to the right anterior upper chest. Following local lidocaine administration, a port pocket was created. The catheter was connected to the port and brought from the pocket to the venotomy site through a subcutaneous tunnel. The catheter was cut to size and inserted through the peel-away sheath. However, the catheter tip only extended to the mid SVC. The catheter was removed over 0.035 inch stiff Glidewire. New catheter was cut 3 cm longer. Peel-away sheath was placed through the tunnel into the right internal jugular vein and the new catheter was inserted through the peel-away sheath. The tip was positioned at the cavoatrial junction. The catheter was then connected to the port. The port was placed  in the pocket. The port aspirated and flushed well. The port pocket was closed with deep and superficial absorbable suture. The port pocket incision and venotomy sites were also sealed with Dermabond. IMPRESSION: Right IJ chest port is ready for use. Electronically Signed   By: Miachel Roux M.D.   On: 05/28/2022 13:02   MR Brain W Wo Contrast  Result Date: 05/25/2022 CLINICAL DATA:  Non-small cell lung cancer, evaluate for metastases EXAM: MRI HEAD WITHOUT AND WITH CONTRAST TECHNIQUE: Multiplanar, multiecho pulse sequences of the brain and surrounding structures were obtained without and with intravenous contrast. CONTRAST:  7.5 mL Vueway COMPARISON:  MRI head 04/22/2008 FINDINGS: Brain: No abnormal parenchymal or meningeal enhancement. No restricted diffusion to suggest acute or subacute infarct. No acute hemorrhage, mass, mass effect, or midline shift. No hydrocephalus or extra-axial collection. Encephalomalacia in the bilateral posterior temporal, lateral occipital, and inferior parietal lobes, sequela of remote infarcts. Sequela of remote infarcts in the left basal ganglia and left frontal white matter. Vascular: Normal arterial flow voids. Normal arterial and venous enhancement. Skull and upper cervical spine: Small enhancing foci in the right parietal calvarium (series 18, image 132 and 135) and right occipital calvarium (series 18, image 83). Otherwise normal marrow signal. Sinuses/Orbits: Mucous retention cyst in the left maxillary sinus. Otherwise clear paranasal sinuses. The orbits are unremarkable. Other: Trace fluid in the right mastoid air cells. IMPRESSION: 1. No evidence of metastatic disease in the brain. 2. Small enhancing foci in the right parietal and right occipital calvarium, which are indeterminate but could represent small osseous metastases. Attention on follow-up. Electronically Signed   By: Merilyn Baba M.D.   On: 05/25/2022 19:26  DG Chest Port 1 View  Result Date:  05/12/2022 CLINICAL DATA:  Status post lung biopsy. EXAM: PORTABLE CHEST 1 VIEW COMPARISON:  March 18, 2022.  April 08, 2022. FINDINGS: The heart size and mediastinal contours are within normal limits. Left lung is clear. Right upper lobe opacity is noted consistent with central obstructive malignancy and postobstructive atelectasis. No definite pneumothorax is noted. The visualized skeletal structures are unremarkable. IMPRESSION: No definite pneumothorax is seen. Right upper lobe opacity is noted consistent with central obstructive malignancy and postobstructive atelectasis. Electronically Signed   By: Marijo Conception M.D.   On: 05/12/2022 14:40

## 2022-06-11 ENCOUNTER — Ambulatory Visit: Payer: Medicare Other

## 2022-06-11 ENCOUNTER — Other Ambulatory Visit: Payer: Self-pay

## 2022-06-11 ENCOUNTER — Ambulatory Visit
Admission: RE | Admit: 2022-06-11 | Discharge: 2022-06-11 | Disposition: A | Discharge status: Home / Self Care | Payer: Medicare Other | Source: Ambulatory Visit | Attending: Radiation Oncology | Admitting: Radiation Oncology

## 2022-06-11 DIAGNOSIS — C3411 Malignant neoplasm of upper lobe, right bronchus or lung: Secondary | ICD-10-CM | POA: Diagnosis not present

## 2022-06-11 DIAGNOSIS — Z5111 Encounter for antineoplastic chemotherapy: Secondary | ICD-10-CM | POA: Diagnosis not present

## 2022-06-11 DIAGNOSIS — Z510 Encounter for antineoplastic radiation therapy: Secondary | ICD-10-CM | POA: Diagnosis not present

## 2022-06-11 DIAGNOSIS — Z87891 Personal history of nicotine dependence: Secondary | ICD-10-CM | POA: Diagnosis not present

## 2022-06-11 LAB — RAD ONC ARIA SESSION SUMMARY
Course Elapsed Days: 8
Plan Fractions Treated to Date: 6
Plan Prescribed Dose Per Fraction: 2 Gy
Plan Total Fractions Prescribed: 35
Plan Total Prescribed Dose: 70 Gy
Reference Point Dosage Given to Date: 12 Gy
Reference Point Session Dosage Given: 2 Gy
Session Number: 6

## 2022-06-14 ENCOUNTER — Ambulatory Visit
Admission: RE | Admit: 2022-06-14 | Discharge: 2022-06-14 | Disposition: A | Discharge status: Home / Self Care | Payer: Medicare Other | Source: Ambulatory Visit | Attending: Radiation Oncology | Admitting: Radiation Oncology

## 2022-06-14 ENCOUNTER — Other Ambulatory Visit: Payer: Self-pay

## 2022-06-14 ENCOUNTER — Ambulatory Visit: Payer: Medicare Other

## 2022-06-14 DIAGNOSIS — Z5111 Encounter for antineoplastic chemotherapy: Secondary | ICD-10-CM | POA: Diagnosis not present

## 2022-06-14 DIAGNOSIS — Z87891 Personal history of nicotine dependence: Secondary | ICD-10-CM | POA: Diagnosis not present

## 2022-06-14 DIAGNOSIS — Z510 Encounter for antineoplastic radiation therapy: Secondary | ICD-10-CM | POA: Diagnosis not present

## 2022-06-14 DIAGNOSIS — C3411 Malignant neoplasm of upper lobe, right bronchus or lung: Secondary | ICD-10-CM | POA: Diagnosis not present

## 2022-06-14 LAB — RAD ONC ARIA SESSION SUMMARY
Course Elapsed Days: 11
Plan Fractions Treated to Date: 7
Plan Prescribed Dose Per Fraction: 2 Gy
Plan Total Fractions Prescribed: 35
Plan Total Prescribed Dose: 70 Gy
Reference Point Dosage Given to Date: 14 Gy
Reference Point Session Dosage Given: 2 Gy
Session Number: 7

## 2022-06-15 ENCOUNTER — Ambulatory Visit
Admission: RE | Admit: 2022-06-15 | Discharge: 2022-06-15 | Disposition: A | Discharge status: Home / Self Care | Payer: Medicare Other | Source: Ambulatory Visit | Attending: Radiation Oncology | Admitting: Radiation Oncology

## 2022-06-15 ENCOUNTER — Inpatient Hospital Stay: Payer: Medicare Other | Admitting: Licensed Clinical Social Worker

## 2022-06-15 ENCOUNTER — Ambulatory Visit: Payer: Medicare Other

## 2022-06-15 ENCOUNTER — Other Ambulatory Visit: Payer: Self-pay

## 2022-06-15 ENCOUNTER — Inpatient Hospital Stay: Payer: Medicare Other

## 2022-06-15 DIAGNOSIS — C3411 Malignant neoplasm of upper lobe, right bronchus or lung: Secondary | ICD-10-CM | POA: Diagnosis not present

## 2022-06-15 DIAGNOSIS — Z510 Encounter for antineoplastic radiation therapy: Secondary | ICD-10-CM | POA: Diagnosis not present

## 2022-06-15 DIAGNOSIS — C3490 Malignant neoplasm of unspecified part of unspecified bronchus or lung: Secondary | ICD-10-CM

## 2022-06-15 DIAGNOSIS — Z5111 Encounter for antineoplastic chemotherapy: Secondary | ICD-10-CM | POA: Diagnosis not present

## 2022-06-15 DIAGNOSIS — Z87891 Personal history of nicotine dependence: Secondary | ICD-10-CM | POA: Diagnosis not present

## 2022-06-15 LAB — RAD ONC ARIA SESSION SUMMARY
Course Elapsed Days: 12
Plan Fractions Treated to Date: 8
Plan Prescribed Dose Per Fraction: 2 Gy
Plan Total Fractions Prescribed: 35
Plan Total Prescribed Dose: 70 Gy
Reference Point Dosage Given to Date: 16 Gy
Reference Point Session Dosage Given: 2 Gy
Session Number: 8

## 2022-06-15 NOTE — Progress Notes (Signed)
[place] Work  Initial Assessment   Derek Webb is a 71 y.o. year old male contacted by phone. Clinical Social Work was referred by medical provider for assessment of psychosocial needs.   SDOH (Social Determinants of Health) assessments performed: Yes SDOH Interventions    Flowsheet Row Clinical Support from 06/15/2022 in [place] at [place] Interventions   Food Insecurity Interventions Intervention Not Indicated  Housing Interventions Intervention Not Indicated  Transportation Interventions CCAR Lucianne Lei ([place]. Only)  Utilities Interventions Intervention Not Indicated  Alcohol Usage Interventions Intervention Not Indicated (Score <7)  Depression Interventions/Treatment  Counseling  Physical Activity Interventions Intervention Not Indicated  Stress Interventions Intervention Not Indicated  Social Connections Interventions Intervention Not Indicated       SDOH Screenings   Food Insecurity: No Food Insecurity (06/15/2022)  Housing: Low Risk  (06/15/2022)  Transportation Needs: No Transportation Needs (06/15/2022)  Utilities: Not At Risk (06/15/2022)  Alcohol Screen: Low Risk  (06/15/2022)  Depression (PHQ2-9): Low Risk  (06/15/2022)  Financial Resource Strain: Low Risk  (11/28/2020)  Physical Activity: Inactive (06/15/2022)  Social Connections: Socially Isolated (06/15/2022)  Stress: Stress Concern Present (06/15/2022)  Tobacco Use: Medium Risk (06/10/2022)     Distress Screen completed: No     No data to display            Family/Social Information:  Housing Arrangement: patient lives with sister [name], [name] (Sister) [phone] primary caregiver Family members/support persons in your life? Family and Medical Staff Transportation concerns: no  Employment: Retired  .  Income source - SS Retirement Type of concern:  Financial concerns non-specific Food access concerns: no Religious or spiritual  practice: Not known Services Currently in place:  [facility] Medicare, Medicaid Access  Coping/ Adjustment to diagnosis: Patient understands treatment plan and what happens next? yes Concerns about diagnosis and/or treatment: How I will pay for the services I need and Quality of life Patient reported stressors: Finances Hopes and/or priorities: N/A Patient enjoys  N/A Current coping skills/ strengths: Supportive family/friends  and Other: Sufficient Medical Coverage    SUMMARY: Current SDOH Barriers:  Financial constraints related to fixed income, patient hard of hearing  Clinical Social Work Clinical Goal(s):  No clinical social work goals at this time  Interventions: Discussed common feeling and emotions when being diagnosed with cancer, and the importance of support during treatment Informed patient of the support team roles and support services at [facility] Provided [place] contact information and encouraged patient to call with any questions or concerns Provided patient with information about CSW role in patient care and other available resources and patient has received fund assistance, gas card assistance and is pending food card assistance.  CSW stated  I would contact [place] and request an update.  Main contact is patient's sister [name], [name] (Sister)  [phone]   Follow Up Plan: Patient will contact CSW with any support or resource needs Patient verbalizes understanding of plan: Yes    Adelene Amas, LCSW

## 2022-06-15 NOTE — Progress Notes (Signed)
Nutrition Assessment:  Referral from [facility]  71 year old male with SCC right lung.  Past medical history of COPD, HTN, HLD, stroke, seizures,  LUL SCC s/p ablation 2013.  Patient receiving radiation and carboplatin/taxol.  Spoke with sister, [name] via phone.  She is the main contact for patient.  She says that patient is starting to gain weight.  She helps prepare meals for patient and helps him with grocery shopping.  Patient does some cooking on his own.  He lives alone.  Drinks ensure 1 time per day.  Eats eggs with cheese in the morning.  Does not eat pork.  Lunch yesterday ate 2 sloppy Joe sandwiches.  Sister unsure what he ate for dinner last night.      Medications: compazine, dilantin, Vit D, decadron, zofran, protonix  Labs: glucose 106  Anthropometrics:   Height: 67 inches Weight: 168 lb  181 lb in 12/08/21 BMI: 26  7% weight loss in the last 5 months   NUTRITION DIAGNOSIS: Unintentional weight loss related to cancer as evidenced by 7% weight loss in the last 5 months   INTERVENTION:  Discussed the importance of nutrition during cancer treatment and weight maintenance. Encouraged good sources of protein at every meal. Examples of protein foods discussed Continue oral nutrition supplements for added nutrition Sister prefers to reach out to RD if needed in the future vs scheduling follow-up visit at this time.  Sister has RD contact information     NEXT VISIT: no follow-up Sister prefers to call RD if needed  [place] Marte B. Zenia Resides, [place], [place] Registered Dietitian [phone]

## 2022-06-16 ENCOUNTER — Other Ambulatory Visit: Payer: Self-pay

## 2022-06-16 ENCOUNTER — Ambulatory Visit: Payer: Medicare Other

## 2022-06-16 ENCOUNTER — Ambulatory Visit
Admission: RE | Admit: 2022-06-16 | Discharge: 2022-06-16 | Disposition: A | Discharge status: Home / Self Care | Payer: Medicare Other | Source: Ambulatory Visit | Attending: Radiation Oncology | Admitting: Radiation Oncology

## 2022-06-16 DIAGNOSIS — C3411 Malignant neoplasm of upper lobe, right bronchus or lung: Secondary | ICD-10-CM | POA: Diagnosis not present

## 2022-06-16 DIAGNOSIS — Z5111 Encounter for antineoplastic chemotherapy: Secondary | ICD-10-CM | POA: Diagnosis not present

## 2022-06-16 DIAGNOSIS — Z510 Encounter for antineoplastic radiation therapy: Secondary | ICD-10-CM | POA: Diagnosis not present

## 2022-06-16 DIAGNOSIS — Z87891 Personal history of nicotine dependence: Secondary | ICD-10-CM | POA: Diagnosis not present

## 2022-06-16 LAB — RAD ONC ARIA SESSION SUMMARY
Course Elapsed Days: 13
Plan Fractions Treated to Date: 9
Plan Prescribed Dose Per Fraction: 2 Gy
Plan Total Fractions Prescribed: 35
Plan Total Prescribed Dose: 70 Gy
Reference Point Dosage Given to Date: 18 Gy
Reference Point Session Dosage Given: 2 Gy
Session Number: 9

## 2022-06-17 ENCOUNTER — Inpatient Hospital Stay: Payer: Medicare Other

## 2022-06-17 ENCOUNTER — Inpatient Hospital Stay (HOSPITAL_BASED_OUTPATIENT_CLINIC_OR_DEPARTMENT_OTHER): Payer: Medicare Other | Admitting: Internal Medicine

## 2022-06-17 ENCOUNTER — Ambulatory Visit: Payer: Medicare Other | Admitting: Internal Medicine

## 2022-06-17 ENCOUNTER — Ambulatory Visit: Payer: Medicare Other

## 2022-06-17 ENCOUNTER — Ambulatory Visit
Admission: RE | Admit: 2022-06-17 | Discharge: 2022-06-17 | Disposition: A | Discharge status: Home / Self Care | Payer: Medicare Other | Source: Ambulatory Visit | Attending: Radiation Oncology | Admitting: Radiation Oncology

## 2022-06-17 ENCOUNTER — Other Ambulatory Visit: Payer: Self-pay

## 2022-06-17 ENCOUNTER — Encounter: Payer: Self-pay | Admitting: Internal Medicine

## 2022-06-17 VITALS — BP 100/79 | HR 63 | Temp 96.3°F | Resp 16 | Wt 168.0 lb

## 2022-06-17 DIAGNOSIS — Z510 Encounter for antineoplastic radiation therapy: Secondary | ICD-10-CM | POA: Diagnosis not present

## 2022-06-17 DIAGNOSIS — Z5111 Encounter for antineoplastic chemotherapy: Secondary | ICD-10-CM | POA: Diagnosis not present

## 2022-06-17 DIAGNOSIS — C3411 Malignant neoplasm of upper lobe, right bronchus or lung: Secondary | ICD-10-CM | POA: Diagnosis not present

## 2022-06-17 DIAGNOSIS — C3491 Malignant neoplasm of unspecified part of right bronchus or lung: Secondary | ICD-10-CM

## 2022-06-17 DIAGNOSIS — Z87891 Personal history of nicotine dependence: Secondary | ICD-10-CM | POA: Diagnosis not present

## 2022-06-17 LAB — COMPREHENSIVE METABOLIC PANEL
ALT: 15 U/L (ref 0–44)
AST: 16 U/L (ref 15–41)
Albumin: 3.6 g/dL (ref 3.5–5.0)
Alkaline Phosphatase: 108 U/L (ref 38–126)
Anion gap: 7 (ref 5–15)
BUN: 6 mg/dL — ABNORMAL LOW (ref 8–23)
CO2: 27 mmol/L (ref 22–32)
Calcium: 9.2 mg/dL (ref 8.9–10.3)
Chloride: 102 mmol/L (ref 98–111)
Creatinine, Ser: 0.49 mg/dL — ABNORMAL LOW (ref 0.61–1.24)
GFR, Estimated: >60 mL/min (ref >60)
Glucose, Bld: 111 mg/dL — ABNORMAL HIGH (ref 70–99)
Potassium: 3.8 mmol/L (ref 3.5–5.1)
Sodium: 136 mmol/L (ref 135–145)
Total Bilirubin: 0.4 mg/dL (ref 0.3–1.2)
Total Protein: 7.6 g/dL (ref 6.5–8.1)

## 2022-06-17 LAB — RAD ONC ARIA SESSION SUMMARY
Course Elapsed Days: 14
Plan Fractions Treated to Date: 10
Plan Prescribed Dose Per Fraction: 2 Gy
Plan Total Fractions Prescribed: 35
Plan Total Prescribed Dose: 70 Gy
Reference Point Dosage Given to Date: 20 Gy
Reference Point Session Dosage Given: 2 Gy
Session Number: 10

## 2022-06-17 LAB — CBC WITH DIFFERENTIAL/PLATELET
Abs Immature Granulocytes: 0.02 10*3/uL (ref 0.00–0.07)
Basophils Absolute: 0 10*3/uL (ref 0.0–0.1)
Basophils Relative: 1 %
Eosinophils Absolute: 0.1 10*3/uL (ref 0.0–0.5)
Eosinophils Relative: 2 %
HCT: 34.2 % — ABNORMAL LOW (ref 39.0–52.0)
Hemoglobin: 11.6 g/dL — ABNORMAL LOW (ref 13.0–17.0)
Immature Granulocytes: 0 %
Lymphocytes Relative: 22 %
Lymphs Abs: 1 10*3/uL (ref 0.7–4.0)
MCH: 30.8 pg (ref 26.0–34.0)
MCHC: 33.9 g/dL (ref 30.0–36.0)
MCV: 90.7 fL (ref 80.0–100.0)
Monocytes Absolute: 0.5 10*3/uL (ref 0.1–1.0)
Monocytes Relative: 10 %
Neutro Abs: 3 10*3/uL (ref 1.7–7.7)
Neutrophils Relative %: 65 %
Platelets: 485 10*3/uL — ABNORMAL HIGH (ref 150–400)
RBC: 3.77 MIL/uL — ABNORMAL LOW (ref 4.22–5.81)
RDW: 13.3 % (ref 11.5–15.5)
WBC: 4.5 10*3/uL (ref 4.0–10.5)
nRBC: 0 % (ref 0.0–0.2)

## 2022-06-17 MED ORDER — FAMOTIDINE IN NACL 20-0.9 MG/50ML-% IV SOLN
20.0000 mg | Freq: Once | INTRAVENOUS | Status: AC
  Administered 2022-06-17: 20 mg via INTRAVENOUS
  Filled 2022-06-17: qty 50

## 2022-06-17 MED ORDER — HEPARIN SOD (PORK) LOCK FLUSH 100 UNIT/ML IV SOLN
INTRAVENOUS | Status: AC
  Administered 2022-06-17: 500 [IU]
  Filled 2022-06-17: qty 5

## 2022-06-17 MED ORDER — HEPARIN SOD (PORK) LOCK FLUSH 100 UNIT/ML IV SOLN
500.0000 [IU] | Freq: Once | INTRAVENOUS | Status: AC | PRN
  Filled 2022-06-17: qty 5

## 2022-06-17 MED ORDER — DIPHENHYDRAMINE HCL 50 MG/ML IJ SOLN
50.0000 mg | Freq: Once | INTRAMUSCULAR | Status: AC
  Administered 2022-06-17: 50 mg via INTRAVENOUS
  Filled 2022-06-17: qty 1

## 2022-06-17 MED ORDER — SODIUM CHLORIDE 0.9 % IV SOLN
200.8000 mg | Freq: Once | INTRAVENOUS | Status: AC
  Administered 2022-06-17: 200 mg via INTRAVENOUS
  Filled 2022-06-17: qty 20

## 2022-06-17 MED ORDER — SODIUM CHLORIDE 0.9 % IV SOLN
Freq: Once | INTRAVENOUS | Status: AC
  Filled 2022-06-17: qty 250

## 2022-06-17 MED ORDER — SODIUM CHLORIDE 0.9 % IV SOLN
45.0000 mg/m2 | Freq: Once | INTRAVENOUS | Status: AC
  Administered 2022-06-17: 84 mg via INTRAVENOUS
  Filled 2022-06-17: qty 14

## 2022-06-17 MED ORDER — PALONOSETRON HCL INJECTION 0.25 MG/5ML
0.2500 mg | Freq: Once | INTRAVENOUS | Status: AC
  Administered 2022-06-17: 0.25 mg via INTRAVENOUS
  Filled 2022-06-17: qty 5

## 2022-06-17 MED ORDER — SODIUM CHLORIDE 0.9 % IV SOLN
10.0000 mg | Freq: Once | INTRAVENOUS | Status: AC
  Administered 2022-06-17: 10 mg via INTRAVENOUS
  Filled 2022-06-17: qty 10

## 2022-06-17 NOTE — Progress Notes (Signed)
Pt in for follow up, sister brought to clinic but had to leave. Pt is unable to recall medication names but states he takes 6 pills in the morning and 5 pill at night.

## 2022-06-17 NOTE — Patient Instructions (Signed)
[facility]  Discharge Instructions: Thank you for choosing [facility] to provide your oncology and hematology care.  If you have a lab appointment with the Cancer Center, please go directly to the Cancer Center and check in at the registration area.  Wear comfortable clothing and clothing appropriate for easy access to any Portacath or PICC line.   We strive to give you quality time with your provider. You may need to reschedule your appointment if you arrive late (15 or more minutes).  Arriving late affects you and other patients whose appointments are after yours.  Also, if you miss three or more appointments without notifying the office, you may be dismissed from the clinic at the provider's discretion.      For prescription refill requests, have your pharmacy contact our office and allow 72 hours for refills to be completed.    Today you received the following chemotherapy and/or immunotherapy agents Taxol and Carboplatin       To help prevent nausea and vomiting after your treatment, we encourage you to take your nausea medication as directed.  BELOW ARE SYMPTOMS THAT SHOULD BE REPORTED IMMEDIATELY: *FEVER GREATER THAN 100.4 F (38 C) OR HIGHER *CHILLS OR SWEATING *NAUSEA AND VOMITING THAT IS NOT CONTROLLED WITH YOUR NAUSEA MEDICATION *UNUSUAL SHORTNESS OF BREATH *UNUSUAL BRUISING OR BLEEDING *URINARY PROBLEMS (pain or burning when urinating, or frequent urination) *BOWEL PROBLEMS (unusual diarrhea, constipation, pain near the anus) TENDERNESS IN MOUTH AND THROAT WITH OR WITHOUT PRESENCE OF ULCERS (sore throat, sores in mouth, or a toothache) UNUSUAL RASH, SWELLING OR PAIN  UNUSUAL VAGINAL DISCHARGE OR ITCHING   Items with * indicate a potential emergency and should be followed up as soon as possible or go to the Emergency Department if any problems should occur.  Please show the CHEMOTHERAPY ALERT CARD or IMMUNOTHERAPY ALERT CARD at  check-in to the Emergency Department and triage nurse.  Should you have questions after your visit or need to cancel or reschedule your appointment, please contact [facility]  [phone] and follow the prompts.  Office hours are 8:00 a.m. to 4:30 p.m. Monday - Friday. Please note that voicemails left after 4:00 p.m. may not be returned until the following business day.  We are closed weekends and major holidays. You have access to a nurse at all times for urgent questions. Please call the main number to the clinic [phone] and follow the prompts.  For any non-urgent questions, you may also contact your provider using MyChart. We now offer e-Visits for anyone 18 and older to request care online for non-urgent symptoms. For details visit [email or web address].   Also download the MyChart app! Go to the app store, search "MyChart", open the app, select [facility], and log in with your MyChart username and password.  Masks are optional in the cancer centers. If you would like for your care team to wear a mask while they are taking care of you, please let them know. For doctor visits, patients may have with them one support person who is at least 69 years old. At this time, visitors are not allowed in the infusion area.  

## 2022-06-17 NOTE — Progress Notes (Signed)
[facility] CONSULT NOTE  Patient Care Team: [name], Ricki Rodriguez as PCP - General (Physician Assistant) Alena Bills, [facility] as Pharmacist (Pharmacist) Telford Nab, RN as Oncology Nurse Navigator  REFERRING PROVIDER: Dr. Patsey Berthold  REASON FOR REFFERAL: newly diagnosed SCCa of right lung  CANCER STAGING   Cancer Staging  Squamous cell lung cancer [facility]) Staging form: Lung, AJCC 7th Edition - Clinical: Stage IIIA (T4, N0, M0) - Signed by [name], MD on 06/03/2022  CURRENT TREATMENT- Weekly Carboplatin and Taxol with RT started 06/03/2022.   ASSESSMENT & PLAN:  Derek Webb 71 y.o. 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  #RUL of lung SCCa, atleast Stage IIIA (cT4N0M0) #Encounter for antineoplastic chemotherapy -PET CT scan was personally reviewed.  There is a large 4 cm mass in the right upper lobe partially obstructing the right upper lobe bronchus, involving the hilum and subcarinal space with postobstructive pneumonia. No lymphadenopathy.   -s/p bronchoscopy with EBUS by Dr. Patsey Berthold on 05/12/2022. -MRI brain on 05/22/2022 personally reviewed.  No evidence of metastatic disease in the brain. Small enhancing foci in the right parietal and right occipital calvarium, which are indeterminate but could represent small osseous metastases. Attention on follow-up. - not a surgical candidate  -Labs reviewed and unremarkable.  Proceed with cycle 3 of carboplatin AUC 2 and paclitaxel 45 mg/m2 today concurrent with radiation with Dr. Donella Stade.  He has antiemetics Zofran and Compazine.  Continue with Decadron for 2 days after chemo.  Has Emla cream.    # Access - port placed on 05/28/2022   Orders Placed This Encounter  Procedures   CBC with Differential     Standing Status:   Future    Standing Expiration Date:   06/25/2023   Comprehensive metabolic panel    Standing Status:   Future    Standing Expiration Date:   06/25/2023   CBC with Differential    Standing Status:   Future    Standing Expiration Date:   07/03/2023   Comprehensive metabolic panel    Standing Status:   Future    Standing Expiration Date:   07/03/2023   CBC with Differential    Standing Status:   Future    Standing Expiration Date:   07/10/2023   Comprehensive metabolic panel    Standing Status:   Future    Standing Expiration Date:   07/10/2023   RTC in 1 week for NP visit, labs, C4 carbo taxol. In 2 weeks for md visit, labs, C5  The total time spent in the appointment was 30 minutes encounter with patients including review of chart and various tests results, discussions about plan of care and coordination of care plan   All questions were answered. The patient knows to call the clinic with any problems, questions or concerns. No barriers to learning was detected.  [name], MD 10/26/202312:16 PM   HISTORY OF PRESENTING ILLNESS:  MAYS PAINO 71 y.o. 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  Patient was seen today accompanied by her Sister [name].  Denies any cough, hemoptysis, shortness of breath and weight loss. Remote smoker.  Quit in 2013. Has family history of lung cancer in father.  INTERVAL HISTORY-  Patient seen today prior to weekly CarboTaxol. He tolerated cycle 1 well. Patient denies fever, chills, nausea, vomiting, shortness of breath, cough, abdominal pain, bleeding, bowel or bladder issues. Energy level is good.  Appetite is good.  Denies any weight loss. Denies pain.   I have reviewed his chart and materials related to his cancer  extensively and collaborated history with the patient. Summary of oncologic history is as follows: Oncology History Overview Note  History of left upper lobe lung SCC status post microwave thermal ablation in 2013 by Dr. Kathlene Cote.  He was deemed not a surgical candidate and had transportation issues for radiation treatment.   Squamous cell lung cancer ([place])  01/12/2022 Initial Diagnosis   Patient has been following with Dr. Humphrey Rolls of pulmonary for suspicious lung nodule. CT chest in March 2021 showed post ablation scarring in LUL not changed and increased atelectasis of RML and RUL. He did not follow through.   CT chest in 01/12/2022 showed  New abrupt and slightly irregular cut off of the right upper lobe bronchus with associated right upper lobe atelectasis and postobstructive changes. Findings are concerning for endobronchial malignancy    04/08/2022 PET scan   IMPRESSION: 1. Hypermetabolic central right upper lobe lung mass nearly occluding the right upper lobe bronchus, poorly delineated on the noncontrast CT images, measuring approximately 3.8 x 2.9 cm, compatible with primary bronchogenic malignancy. 2. Postobstructive pneumonia/atelectasis throughout the basilar right upper lobe. 3. No hypermetabolic metastatic thoracic adenopathy or distant metastatic disease. 4. Stable mild post ablation change in the left upper lobe with no metabolic evidence of local tumor recurrence in this location. 5. Dilated 4.4 cm ascending thoracic aorta. Recommend annual imaging followup by CTA or MRA   05/12/2022 Procedure   Procedure was delayed due to miscommunication about holding Plavix. S/p bronchoscopy with EBUS by Dr. Patsey Berthold. Op note mentions significant bulky adenopathy in the subcarinal space and in the right hilum.    05/12/2022 Pathology Results   DIAGNOSIS:  A.  LUNG, RIGHT UPPER LOBE; ENB-ASSISTED BIOPSY:  - SQUAMOUS CELL CARCINOMA.   LUNG, RIGHT UPPER LOBE; EBUS-ASSISTED BRUSHING:   - POSITIVE FOR MALIGNANCY.  - SQUAMOUS CELL CARCINOMA IS PRESENT.   LUNG, RIGHT UPPER LOBE; EBUS-ASSISTED LAVAGE:  - POSITIVE FOR MALIGNANCY.  - SQUAMOUS CELL CARCINOMA IS PRESENT  SUBCARINAL SPACE, RIGHT; EBUS-ASSISTED FNA:  - POSITIVE FOR MALIGNANCY.  - SQUAMOUS CELL CARCINOMA IS PRESENT   05/18/2022 Cancer Staging   Staging form: Lung, AJCC 7th Edition - Clinical: Stage IIIA (T4, N0, M0) - Signed by [name], MD on 06/03/2022   05/21/2022 Imaging   MRI brain  No evidence of metastatic disease in the brain. 2. Small enhancing foci in the right parietal and right occipital calvarium, which are indeterminate but could represent small osseous metastases. Attention on follow-up   06/03/2022 -  Chemotherapy   Patient is on Treatment Plan : LUNG Carboplatin + Paclitaxel + XRT q7d       MEDICAL HISTORY:  Past Medical History:  Diagnosis Date   Alcoholism ([place])    COPD (chronic obstructive pulmonary disease) (HCC)    SEVERE COPD -OCCASIONALLY USES OXYGEN AT NIGHT-DOES NOT USE INHALERS ON REGULAR BASIS   Elevated cholesterol    GERD (gastroesophageal reflux disease)    HOH (hard of hearing)    Hypertension    Lung cancer (HCC)    LEFT UPPER LUNG CARCINOMA-NOT A CANDIDATE FOR  SURGICAL RESECTION BECAUSE OF HIS COPD AND POOR CANDIDATE FOR RADIATION BECAUSE OF TRANSPORTATION PROBLEMS   Seizures ([place])    CHRONIC DILATIN - PT STATES NO SEIZURES IN PAST COUPLE OF YEARS; PT STATES HIS SEIZURES CAUSED NUMBNESS OF ARMS AND HANDS AND NOT ABLE TO MOVE HIS ARMS   Stroke ([place])    2 TO 3 YRS AGO-AFFECTED HIS SPEECH--ABLE TO AMBULATE WITHOUT ASSIST AND DOES YARD WORK.  DECREASED HEARING IN BOTH EARS SINCE STROKE.   Vitamin D deficiency     SURGICAL HISTORY: Past Surgical History:  Procedure Laterality Date   CAROTID SURGERY 2009 -LEFT     COLON POLYP EXCISION     COLON SURGERY     COLONOSCOPY WITH PROPOFOL N/A 04/18/2017   Procedure: COLONOSCOPY WITH PROPOFOL;  Surgeon: Manya Silvas, MD;  [facility] END[facility]COPY;  Service: Endoscopy;  Laterality: N/A;   ESOPHAGOGASTRODUODENOSCOPY     IR GENERIC HISTORICAL  01/21/2016   IR RADIOLOGIST EVAL & MGMT 01/21/2016 Aletta Edouard, MD [facility] INTERV RAD   IR GENERIC HISTORICAL  10/22/2014   IR RADIOLOGIST EVAL & MGMT 10/22/2014 Aletta Edouard, MD [facility] INTERV RAD   IR IMAGING GUIDED PORT INSERTION  05/28/2022   IR RADIOLOGIST EVAL & MGMT  02/02/2017   IR RADIOLOGIST EVAL & MGMT  03/08/2018   percutaneous biopsy     VIDEO BRONCHOSCOPY WITH ENDOBRONCHIAL ULTRASOUND Right 05/12/2022   Procedure: VIDEO BRONCHOSCOPY WITH ENDOBRONCHIAL ULTRASOUND;  Surgeon: Tyler Pita, MD;  Location: [facility] ORS;  Service: Cardiopulmonary;  Laterality: Right;    SOCIAL HISTORY: Social History   Socioeconomic History   Marital status: Single    Spouse name: Not on file   Number of children: Not on file   Years of education: Not on file   Highest education level: Not on file  Occupational History   Not on file  Tobacco Use   Smoking status: Former    Packs/day: 1.00    Years: 35.00    Total pack years: 35.00    Types: Cigarettes    Quit date: 12/22/2011    Years since quitting: 10.4   Smokeless tobacco: Never  Vaping Use   Vaping Use: Never used  Substance and Sexual Activity   Alcohol use: Yes    Comment: occosionally    Drug use: No   Sexual activity: Not on file  Other Topics Concern   Not on file  Social History Narrative   Not on file   Social Determinants of Health   Financial Resource Strain: Low Risk  (11/28/2020)   Overall Financial Resource Strain (CARDIA)    Difficulty of Paying Living Expenses: Not hard at all  Food Insecurity: No Food Insecurity (06/15/2022)   Hunger Vital Sign    Worried About Running Out of Food in the Last Year: Never true    Why in the Last Year: Never true  Transportation Needs: No Transportation Needs (06/15/2022)   PRAPARE - Hydrologist  (Medical): No    Lack of Transportation (Non-Medical): No  Physical Activity: Inactive (06/15/2022)   Exercise Vital Sign    Days of Exercise per Week: 0 days    Minutes of Exercise per Session: 0 min  Stress: Stress Concern Present (06/15/2022)   [place]    Feeling of Stress : To some extent  Social Connections: Socially Isolated (06/15/2022)   Social Connection and Isolation Panel [NHANES]    Frequency  of Communication with Friends and Family: More than three times a week    Frequency of Social Gatherings with Friends and Family: More than three times a week    Attends Religious Services: Never    Marine scientist or Organizations: No    Attends Archivist Meetings: Never    Marital Status: Never married  Intimate Partner Violence: Unknown (06/15/2022)   Humiliation, Afraid, Rape, and Kick questionnaire    Fear of Current or Ex-Partner: No    Emotionally Abused: Not on file    Physically Abused: No    Sexually Abused: No    FAMILY HISTORY: Family History  Problem Relation Age of Onset   Colon polyps Sister     ALLERGIES:  has No Known Allergies.  MEDICATIONS:  Current Outpatient Medications  Medication Sig Dispense Refill   aspirin 81 MG tablet Take 81 mg by mouth daily.     atorvastatin (LIPITOR) 80 MG tablet Take 1 tablet (80 mg total) by mouth daily. (Patient taking differently: Take 80 mg by mouth at bedtime.) 90 tablet 3   budesonide-formoterol (SYMBICORT) 160-4.5 MCG/ACT inhaler Inhale 1 puff into the lungs 2 (two) times daily. 1 each 5   Cholecalciferol (VITAMIN D) 2000 UNITS CAPS Take by mouth. VITAMIN D 3 2000 IU  ONE SOFTGEL DAILY  --OTC     clopidogrel (PLAVIX) 75 MG tablet Take 1 tablet (75 mg total) by mouth daily. 90 tablet 3   dexamethasone (DECADRON) 4 MG tablet Take 2 tablets daily for 2 days, start the day after chemotherapy. Take with food. 30 tablet 1   enalapril  (VASOTEC) 20 MG tablet Take 1 tablet (20 mg total) by mouth daily. 90 tablet 3   ezetimibe (ZETIA) 10 MG tablet Take 1 tablet (10 mg total) by mouth daily. 90 tablet 3   lidocaine-prilocaine (EMLA) cream Apply to affected area once 30 g 3   ondansetron (ZOFRAN) 8 MG tablet Take 1 tablet (8 mg total) by mouth every 8 (eight) hours as needed for nausea or vomiting. Start on the third day after chemotherapy. 30 tablet 1   OXYGEN Inhale into the lungs. At night     pantoprazole (PROTONIX) 40 MG tablet Take 1 tablet (40 mg total) by mouth daily. IN AM 90 tablet 1   phenytoin (DILANTIN) 100 MG ER capsule Take 4 capsules (400 mg total) by mouth at bedtime. 450 capsule 1   prochlorperazine (COMPAZINE) 10 MG tablet Take 1 tablet (10 mg total) by mouth every 6 (six) hours as needed for nausea or vomiting. 30 tablet 1   No current facility-administered medications for this visit.   Facility-Administered Medications Ordered in Other Visits  Medication Dose Route Frequency Provider Last Rate Last Admin   CARBOplatin (PARAPLATIN) 200 mg in sodium chloride 0.9 % 100 mL chemo infusion  200 mg Intravenous Once [name], MD       famotidine (PEPCID) IVPB 20 mg premix  20 mg Intravenous Once [name], MD 200 mL/hr at 06/17/22 1207 20 mg at 06/17/22 1207   heparin lock flush 100 UNIT/ML injection            heparin lock flush 100 unit/mL  500 Units Intracatheter Once PRN [name], MD       PACLitaxel (TAXOL) 84 mg in sodium chloride 0.9 % 250 mL chemo infusion (</= 80mg /m2)  45 mg/m2 (Treatment Plan Recorded) Intravenous Once [name], MD        REVIEW OF SYSTEMS:  Pertinent information mentioned in HPI All other systems were reviewed with the patient and are negative.  PHYSICAL EXAMINATION: ECOG PERFORMANCE STATUS: 2 - Symptomatic, <50% confined to bed  Vitals:   06/17/22 1040  BP: 100/79  Pulse: 63  Resp: 16  Temp: (!) 96.3 F (35.7 C)  SpO2: 98%   Filed Weights    06/17/22 1040  Weight: 168 lb (76.2 kg)    GENERAL:alert, no distress and comfortable SKIN: skin color, texture, turgor are normal, no rashes or significant lesions EYES: normal, conjunctiva are pink and non-injected, sclera clear OROPHARYNX:no exudate, no erythema and lips, buccal mucosa, and tongue normal  NECK: supple, thyroid normal size, non-tender, without nodularity LYMPH:  no palpable lymphadenopathy in the cervical, axillary or inguinal LUNGS: clear to auscultation and percussion with normal breathing effort HEART: regular rate & rhythm and no murmurs and no lower extremity edema ABDOMEN:abdomen soft, non-tender and normal bowel sounds Musculoskeletal:no cyanosis of digits and no clubbing  PSYCH: alert & oriented x 3 with fluent speech NEURO: no focal motor/sensory deficits  LABORATORY DATA:  I have reviewed the data as listed Lab Results  Component Value Date   WBC 4.5 06/17/2022   HGB 11.6 (L) 06/17/2022   HCT 34.2 (L) 06/17/2022   MCV 90.7 06/17/2022   PLT 485 (H) 06/17/2022   Recent Labs    06/03/22 0841 06/10/22 0959 06/17/22 0951  NA 134* 134* 136  K 3.6 3.6 3.8  CL 100 97* 102  CO2 26 28 27   GLUCOSE 89 106* 111*  BUN 6* 8 6*  CREATININE 0.53* 0.58* 0.49*  CALCIUM 8.8* 9.0 9.2  GFRNONAA >60 >60 >60  PROT 7.6 7.1 7.6  ALBUMIN 3.8 3.4* 3.6  AST 16 15 16   ALT 12 10 15   ALKPHOS 116 110 108  BILITOT 0.8 0.5 0.4    RADIOGRAPHIC STUDIES: I have personally reviewed the radiological images as listed and agreed with the findings in the report. IR IMAGING GUIDED PORT INSERTION  Result Date: 05/28/2022 INDICATION: Lung malignancy EXAM: IMPLANTED PORT A CATH PLACEMENT WITH ULTRASOUND AND FLUOROSCOPIC GUIDANCE MEDICATIONS: None ANESTHESIA/SEDATION: Moderate (conscious) sedation was employed during this procedure. A total of Versed 1 mg and Fentanyl 50 mcg was administered intravenously by the radiology nurse. Total intra-service moderate Sedation Time: 34  minutes. The patient's level of consciousness and vital signs were monitored continuously by radiology nursing throughout the procedure under my direct supervision. FLUOROSCOPY: Radiation Exposure Index (as provided by the fluoroscopic device): 26.3 mGy Kerma COMPLICATIONS: None immediate. PROCEDURE: The procedure, risks, benefits, and alternatives were explained to the patient. Questions regarding the procedure were encouraged and answered. The patient understands and consents to the procedure. A timeout was performed prior to the initiation of the procedure. Patient positioned supine on the angiography table. Right neck and anterior upper chest prepped and draped in the usual sterile fashion. All elements of maximal sterile barrier were utilized including, cap, mask, sterile gown, sterile gloves, large sterile drape, hand scrubbing and 2% Chlorhexidine for skin cleaning. The right internal jugular vein was evaluated with ultrasound and shown to be patent. A permanent ultrasound image was obtained and placed in the patient's medical record. Local anesthesia was provided with 1% lidocaine with epinephrine. Using sterile gel and a sterile probe cover, the right internal jugular vein was entered with a 21 ga needle during real time ultrasound guidance. 0.018 inch guidewire placed and 21 ga needle exchanged for transitional dilator set. Utilizing fluoroscopy, 0.035 inch guidewire advanced to the  IVC without difficulty. Attention then turned to the right anterior upper chest. Following local lidocaine administration, a port pocket was created. The catheter was connected to the port and brought from the pocket to the venotomy site through a subcutaneous tunnel. The catheter was cut to size and inserted through the peel-away sheath. However, the catheter tip only extended to the mid SVC. The catheter was removed over 0.035 inch stiff Glidewire. New catheter was cut 3 cm longer. Peel-away sheath was placed through the  tunnel into the right internal jugular vein and the new catheter was inserted through the peel-away sheath. The tip was positioned at the cavoatrial junction. The catheter was then connected to the port. The port was placed in the pocket. The port aspirated and flushed well. The port pocket was closed with deep and superficial absorbable suture. The port pocket incision and venotomy sites were also sealed with Dermabond. IMPRESSION: Right IJ chest port is ready for use. Electronically Signed   By: Miachel Roux M.D.   On: 05/28/2022 13:02   MR Brain W Wo Contrast  Result Date: 05/25/2022 CLINICAL DATA:  Non-small cell lung cancer, evaluate for metastases EXAM: MRI HEAD WITHOUT AND WITH CONTRAST TECHNIQUE: Multiplanar, multiecho pulse sequences of the brain and surrounding structures were obtained without and with intravenous contrast. CONTRAST:  7.5 mL Vueway COMPARISON:  MRI head 04/22/2008 FINDINGS: Brain: No abnormal parenchymal or meningeal enhancement. No restricted diffusion to suggest acute or subacute infarct. No acute hemorrhage, mass, mass effect, or midline shift. No hydrocephalus or extra-axial collection. Encephalomalacia in the bilateral posterior temporal, lateral occipital, and inferior parietal lobes, sequela of remote infarcts. Sequela of remote infarcts in the left basal ganglia and left frontal white matter. Vascular: Normal arterial flow voids. Normal arterial and venous enhancement. Skull and upper cervical spine: Small enhancing foci in the right parietal calvarium (series 18, image 132 and 135) and right occipital calvarium (series 18, image 83). Otherwise normal marrow signal. Sinuses/Orbits: Mucous retention cyst in the left maxillary sinus. Otherwise clear paranasal sinuses. The orbits are unremarkable. Other: Trace fluid in the right mastoid air cells. IMPRESSION: 1. No evidence of metastatic disease in the brain. 2. Small enhancing foci in the right parietal and right occipital  calvarium, which are indeterminate but could represent small osseous metastases. Attention on follow-up. Electronically Signed   By: Merilyn Baba M.D.   On: 05/25/2022 19:26

## 2022-06-18 ENCOUNTER — Other Ambulatory Visit: Payer: Self-pay

## 2022-06-18 ENCOUNTER — Ambulatory Visit: Payer: Medicare Other

## 2022-06-18 ENCOUNTER — Ambulatory Visit
Admission: RE | Admit: 2022-06-18 | Discharge: 2022-06-18 | Disposition: A | Discharge status: Home / Self Care | Payer: Medicare Other | Source: Ambulatory Visit | Attending: Radiation Oncology | Admitting: Radiation Oncology

## 2022-06-18 DIAGNOSIS — Z510 Encounter for antineoplastic radiation therapy: Secondary | ICD-10-CM | POA: Diagnosis not present

## 2022-06-18 DIAGNOSIS — Z5111 Encounter for antineoplastic chemotherapy: Secondary | ICD-10-CM | POA: Diagnosis not present

## 2022-06-18 DIAGNOSIS — Z87891 Personal history of nicotine dependence: Secondary | ICD-10-CM | POA: Diagnosis not present

## 2022-06-18 DIAGNOSIS — C3411 Malignant neoplasm of upper lobe, right bronchus or lung: Secondary | ICD-10-CM | POA: Diagnosis not present

## 2022-06-18 LAB — RAD ONC ARIA SESSION SUMMARY
Course Elapsed Days: 15
Plan Fractions Treated to Date: 11
Plan Prescribed Dose Per Fraction: 2 Gy
Plan Total Fractions Prescribed: 35
Plan Total Prescribed Dose: 70 Gy
Reference Point Dosage Given to Date: 22 Gy
Reference Point Session Dosage Given: 2 Gy
Session Number: 11

## 2022-06-21 ENCOUNTER — Encounter: Payer: Self-pay | Admitting: Internal Medicine

## 2022-06-21 ENCOUNTER — Encounter: Payer: Self-pay | Admitting: Licensed Clinical Social Worker

## 2022-06-21 ENCOUNTER — Ambulatory Visit
Admission: RE | Admit: 2022-06-21 | Discharge: 2022-06-21 | Disposition: A | Discharge status: Home / Self Care | Payer: Medicare Other | Source: Ambulatory Visit | Attending: Radiation Oncology | Admitting: Radiation Oncology

## 2022-06-21 ENCOUNTER — Ambulatory Visit: Payer: Medicare Other

## 2022-06-21 ENCOUNTER — Other Ambulatory Visit: Payer: Self-pay

## 2022-06-21 ENCOUNTER — Other Ambulatory Visit: Payer: Self-pay | Admitting: Physician Assistant

## 2022-06-21 ENCOUNTER — Ambulatory Visit (INDEPENDENT_AMBULATORY_CARE_PROVIDER_SITE_OTHER): Payer: Medicare Other | Admitting: Internal Medicine

## 2022-06-21 VITALS — BP 110/71 | HR 63 | Temp 98.2°F | Resp 16 | Ht 67.0 in | Wt 168.4 lb

## 2022-06-21 DIAGNOSIS — Z23 Encounter for immunization: Secondary | ICD-10-CM

## 2022-06-21 DIAGNOSIS — C3492 Malignant neoplasm of unspecified part of left bronchus or lung: Secondary | ICD-10-CM | POA: Diagnosis not present

## 2022-06-21 DIAGNOSIS — J4489 Other specified chronic obstructive pulmonary disease: Secondary | ICD-10-CM | POA: Diagnosis not present

## 2022-06-21 DIAGNOSIS — Z87891 Personal history of nicotine dependence: Secondary | ICD-10-CM | POA: Diagnosis not present

## 2022-06-21 DIAGNOSIS — C3411 Malignant neoplasm of upper lobe, right bronchus or lung: Secondary | ICD-10-CM | POA: Diagnosis not present

## 2022-06-21 DIAGNOSIS — C3490 Malignant neoplasm of unspecified part of unspecified bronchus or lung: Secondary | ICD-10-CM

## 2022-06-21 DIAGNOSIS — K219 Gastro-esophageal reflux disease without esophagitis: Secondary | ICD-10-CM

## 2022-06-21 DIAGNOSIS — Z510 Encounter for antineoplastic radiation therapy: Secondary | ICD-10-CM | POA: Diagnosis not present

## 2022-06-21 DIAGNOSIS — Z5111 Encounter for antineoplastic chemotherapy: Secondary | ICD-10-CM | POA: Diagnosis not present

## 2022-06-21 LAB — RAD ONC ARIA SESSION SUMMARY
Course Elapsed Days: 18
Plan Fractions Treated to Date: 12
Plan Prescribed Dose Per Fraction: 2 Gy
Plan Total Fractions Prescribed: 35
Plan Total Prescribed Dose: 70 Gy
Reference Point Dosage Given to Date: 24 Gy
Reference Point Session Dosage Given: 2 Gy
Session Number: 12

## 2022-06-21 NOTE — Patient Instructions (Signed)
Chronic Obstructive Pulmonary Disease  Chronic obstructive pulmonary disease (COPD) is a long-term (chronic) lung problem. When you have COPD, it is hard for air to get in and out of your lungs. Usually the condition gets worse over time, and your lungs will never return to normal. There are things you can do to keep yourself as healthy as possible. What are the causes? Smoking. This is the most common cause. Certain genes passed from parent to child (inherited). What increases the risk? Being exposed to secondhand smoke from cigarettes, pipes, or cigars. Being exposed to chemicals and other irritants, such as fumes and dust in the work environment. Having chronic lung conditions or infections. What are the signs or symptoms? Shortness of breath, especially during physical activity. A long-term cough with a large amount of thick mucus. Sometimes, the cough may not have any mucus (dry cough). Wheezing. Breathing quickly. Skin that looks gray or blue, especially in the fingers, toes, or lips. Feeling tired (fatigue). Weight loss. Chest tightness. Having infections often. Episodes when breathing symptoms become much worse (exacerbations). At the later stages of this disease, you may have swelling in the ankles, feet, or legs. How is this treated? Taking medicines. Quitting smoking, if you smoke. Rehabilitation. This includes steps to make your body work better. It may involve a team of specialists. Doing exercises. Making changes to your diet. Using oxygen. Lung surgery. Lung transplant. Comfort measures (palliative care). Follow these instructions at home: Medicines Take over-the-counter and prescription medicines only as told by your doctor. Talk to your doctor before taking any cough or allergy medicines. You may need to avoid medicines that cause your lungs to be dry. Lifestyle If you smoke, stop smoking. Smoking makes the problem worse. Do not smoke or use any products that  contain nicotine or tobacco. If you need help quitting, ask your doctor. Avoid being around things that make your breathing worse. This may include smoke, chemicals, and fumes. Stay active, but remember to rest as well. Learn and use tips on how to manage stress and control your breathing. Make sure you get enough sleep. Most adults need at least 7 hours of sleep every night. Eat healthy foods. Eat smaller meals more often. Rest before meals. Controlled breathing Learn and use tips on how to control your breathing as told by your doctor. Try: Breathing in (inhaling) through your nose for 1 second. Then, pucker your lips and breath out (exhale) through your lips for 2 seconds. Putting one hand on your belly (abdomen). Breathe in slowly through your nose for 1 second. Your hand on your belly should move out. Pucker your lips and breathe out slowly through your lips. Your hand on your belly should move in as you breathe out.  Controlled coughing Learn and use controlled coughing to clear mucus from your lungs. Follow these steps: Lean your head a little forward. Breathe in deeply. Try to hold your breath for 3 seconds. Keep your mouth slightly open while coughing 2 times. Spit any mucus out into a tissue. Rest and do the steps again 1 or 2 times as needed. General instructions Make sure you get all the shots (vaccines) that your doctor recommends. Ask your doctor about a flu shot and a pneumonia shot. Use oxygen therapy and pulmonary rehabilitation if told by your doctor. If you need home oxygen therapy, ask your doctor if you should buy a tool to measure your oxygen level (oximeter). Make a COPD action plan with your doctor. This helps you   to know what to do if you feel worse than usual. Manage any other conditions you have as told by your doctor. Avoid going outside when it is very hot, cold, or humid. Avoid people who have a sickness you can catch (contagious). Keep all follow-up  visits. Contact a doctor if: You cough up more mucus than usual. There is a change in the color or thickness of the mucus. It is harder to breathe than usual. Your breathing is faster than usual. You have trouble sleeping. You need to use your medicines more often than usual. You have trouble doing your normal activities such as getting dressed or walking around the house. Get help right away if: You have shortness of breath while resting. You have shortness of breath that stops you from: Being able to talk. Doing normal activities. Your chest hurts for longer than 5 minutes. Your skin color is more blue than usual. Your pulse oximeter shows that you have low oxygen for longer than 5 minutes. You have a fever. You feel too tired to breathe normally. These symptoms may represent a serious problem that is an emergency. Do not wait to see if the symptoms will go away. Get medical help right away. Call your local emergency services (911 in the [place] Do not drive yourself to the hospital. Summary Chronic obstructive pulmonary disease (COPD) is a long-term lung problem. The way your lungs work will never return to normal. Usually the condition gets worse over time. There are things you can do to keep yourself as healthy as possible. Take over-the-counter and prescription medicines only as told by your doctor. If you smoke, stop. Smoking makes the problem worse. This information is not intended to replace advice given to you by your health care provider. Make sure you discuss any questions you have with your health care provider. Document Revised: 06/17/2020 Document Reviewed: 06/17/2020 Elsevier Patient Education  2023 Elsevier Inc.  

## 2022-06-21 NOTE — Progress Notes (Signed)
[facility] CSW Progress Note  Clinical Social Worker  returned caregiver's [place], Guardia (Sister) call  to confirm advance directives appointment on 11/6 @ 9:30AM.    Adelene Amas, LCSW

## 2022-06-21 NOTE — Progress Notes (Signed)
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 Patient has been tolerating relatively well. He has no major side effects from the therapy as far as his breathing is concerned he has some shortness of breath little bit of cough.  There is no significant congestion otherwise.  Denies having any chest pain no palpitations.  No fevers and no chills are noted.  ROS  General: (-) fever, (-) chills, (-) night sweats, (-) weakness Skin: (-) rashes, (-) itching,. Eyes: (-) visual changes, (-) redness, (-) itching. Nose and Sinuses: (-) nasal stuffiness or itchiness, (-) postnasal drip, (-) nosebleeds, (-) sinus trouble. Mouth and Throat: (-) sore throat, (-) hoarseness. Neck: (-) swollen glands, (-) enlarged thyroid, (-) neck pain. Respiratory: - cough, (-) bloody sputum, - shortness of breath, - wheezing. Cardiovascular: - ankle swelling, (-) chest pain. Lymphatic: (-) lymph node enlargement. Neurologic: (-) numbness, (-) tingling. Psychiatric: (-) anxiety, (-) depression   Current Medication: Outpatient Encounter Medications as of 06/21/2022  Medication Sig   aspirin 81 MG tablet Take 81 mg by mouth daily.   atorvastatin (LIPITOR) 80 MG tablet Take 1 tablet (80 mg total) by mouth daily. (Patient taking differently: Take 80 mg by mouth at bedtime.)   budesonide-formoterol (SYMBICORT) 160-4.5 MCG/ACT inhaler Inhale 1 puff into the lungs 2 (two) times daily.   Cholecalciferol (VITAMIN D) 2000 UNITS CAPS Take by mouth. VITAMIN D 3 2000 IU  ONE SOFTGEL DAILY  --OTC   clopidogrel (PLAVIX) 75 MG tablet Take 1 tablet (75 mg total) by mouth daily.   dexamethasone (DECADRON) 4 MG tablet Take 2 tablets daily for 2 days, start the day after chemotherapy. Take with  food.   enalapril (VASOTEC) 20 MG tablet Take 1 tablet (20 mg total) by mouth daily.   ezetimibe (ZETIA) 10 MG tablet Take 1 tablet (10 mg total) by mouth daily.   lidocaine-prilocaine (EMLA) cream Apply to affected area once   ondansetron (ZOFRAN) 8 MG tablet Take 1 tablet (8 mg total) by mouth every 8 (eight) hours as needed for nausea or vomiting. Start on the third day after chemotherapy.   OXYGEN Inhale into the lungs. At night   pantoprazole (PROTONIX) 40 MG tablet Take 1 tablet (40 mg total) by mouth daily. IN AM   phenytoin (DILANTIN) 100 MG ER capsule Take 4 capsules (400 mg total) by mouth at bedtime.   prochlorperazine (COMPAZINE) 10 MG tablet Take 1 tablet (10 mg total) by mouth every 6 (six) hours as needed for nausea or vomiting.   No facility-administered encounter medications on file as of 06/21/2022.    Surgical History: Past Surgical History:  Procedure Laterality Date   CAROTID SURGERY 2009 -LEFT     COLON POLYP EXCISION     COLON SURGERY     COLONOSCOPY WITH PROPOFOL N/A 04/18/2017   Procedure: COLONOSCOPY WITH PROPOFOL;  Surgeon: Manya Silvas, MD;  Location: [facility] ENDOSCOPY;  Service: Endoscopy;  Laterality: N/A;   ESOPHAGOGASTRODUODENOSCOPY     IR GENERIC HISTORICAL  01/21/2016   IR RADIOLOGIST EVAL & MGMT 01/21/2016 Aletta Edouard, MD [facility] INTERV RAD   IR GENERIC HISTORICAL  10/22/2014   IR RADIOLOGIST EVAL & MGMT 10/22/2014 Aletta Edouard, MD [facility] INTERV RAD   IR IMAGING GUIDED PORT INSERTION  05/28/2022  IR RADIOLOGIST EVAL & MGMT  02/02/2017   IR RADIOLOGIST EVAL & MGMT  03/08/2018   percutaneous biopsy     VIDEO BRONCHOSCOPY WITH ENDOBRONCHIAL ULTRASOUND Right 05/12/2022   Procedure: VIDEO BRONCHOSCOPY WITH ENDOBRONCHIAL ULTRASOUND;  Surgeon: Tyler Pita, MD;  Location: [facility] ORS;  Service: Cardiopulmonary;  Laterality: Right;    Medical History: Past Medical History:  Diagnosis Date   Alcoholism ([place])    COPD (chronic obstructive pulmonary  disease) (HCC)    SEVERE COPD -OCCASIONALLY USES OXYGEN AT NIGHT-DOES NOT USE INHALERS ON REGULAR BASIS   Elevated cholesterol    GERD (gastroesophageal reflux disease)    HOH (hard of hearing)    Hypertension    Lung cancer ([place])    LEFT UPPER LUNG CARCINOMA-NOT A CANDIDATE FOR SURGICAL RESECTION BECAUSE OF HIS COPD AND POOR CANDIDATE FOR RADIATION BECAUSE OF TRANSPORTATION PROBLEMS   Seizures ([place])    CHRONIC DILATIN - PT STATES NO SEIZURES IN PAST COUPLE OF YEARS; PT STATES HIS SEIZURES CAUSED NUMBNESS OF ARMS AND HANDS AND NOT ABLE TO MOVE HIS ARMS   Stroke ([place])    2 TO 3 YRS AGO-AFFECTED HIS SPEECH--ABLE TO AMBULATE WITHOUT ASSIST AND DOES YARD WORK.  DECREASED HEARING IN BOTH EARS SINCE STROKE.   Vitamin D deficiency     Family History: Family History  Problem Relation Age of Onset   Colon polyps Sister     Social History: Social History   Socioeconomic History   Marital status: Single    Spouse name: Not on file   Number of children: Not on file   Years of education: Not on file   Highest education level: Not on file  Occupational History   Not on file  Tobacco Use   Smoking status: Former    Packs/day: 1.00    Years: 35.00    Total pack years: 35.00    Types: Cigarettes    Quit date: 12/22/2011    Years since quitting: 10.5   Smokeless tobacco: Never  Vaping Use   Vaping Use: Never used  Substance and Sexual Activity   Alcohol use: Yes    Comment: occosionally    Drug use: No   Sexual activity: Not on file  Other Topics Concern   Not on file  Social History Narrative   Not on file   Social Determinants of Health   Financial Resource Strain: Low Risk  (11/28/2020)   Overall Financial Resource Strain (CARDIA)    Difficulty of Paying Living Expenses: Not hard at all  Food Insecurity: No Food Insecurity (06/15/2022)   Hunger Vital Sign    Worried About Running Out of Food in the Last Year: Never true    [place] in the Last Year: Never true   Transportation Needs: No Transportation Needs (06/15/2022)   PRAPARE - Hydrologist (Medical): No    Lack of Transportation (Non-Medical): No  Physical Activity: Inactive (06/15/2022)   Exercise Vital Sign    Days of Exercise per Week: 0 days    Minutes of Exercise per Session: 0 min  Stress: Stress Concern Present (06/15/2022)   [place]    Feeling of Stress : To some extent  Social Connections: Socially Isolated (06/15/2022)   Social Connection and Isolation Panel [NHANES]    Frequency of Communication with Friends and Family: More than three times a week    Frequency of Social Gatherings with Friends and Family:  More than three times a week    Attends Religious Services: Never    Active Member of Clubs or Organizations: No    Attends Archivist Meetings: Never    Marital Status: Never married  Intimate Partner Violence: Unknown (06/15/2022)   Humiliation, Afraid, Rape, and Kick questionnaire    Fear of Current or Ex-Partner: No    Emotionally Abused: Not on file    Physically Abused: No    Sexually Abused: No    Vital Signs: Blood pressure 110/71, pulse 63, temperature 98.2 F (36.8 C), resp. rate 16, height 5\' 7"  (1.702 m), weight 168 lb 6.4 oz (76.4 kg), SpO2 98 %.  Examination: General Appearance: The patient is well-developed, well-nourished, and in no distress. Skin: Gross inspection of skin unremarkable. Head: normocephalic, no gross deformities. Eyes: no gross deformities noted. ENT: ears appear grossly normal no exudates. Neck: Supple. No thyromegaly. No LAD. Respiratory: no rhonchi noted. Cardiovascular: Normal S1 and S2 without murmur or rub. Extremities: No cyanosis. pulses are equal. Neurologic: Alert and oriented. No involuntary movements.  LABS: Recent Results (from the past 2160 hour(s))  SARS CORONAVIRUS 2 (TAT 6-24 HRS) Anterior Nasal Swab      Status: None   Collection Time: 04/08/22  9:53 AM   Specimen: Anterior Nasal Swab  Result Value Ref Range   SARS Coronavirus 2 NEGATIVE NEGATIVE    Comment: (NOTE) SARS-CoV-2 target nucleic acids are NOT DETECTED.  The SARS-CoV-2 RNA is generally detectable in upper and lower respiratory specimens during the acute phase of infection. Negative results do not preclude SARS-CoV-2 infection, do not rule out co-infections with other pathogens, and should not be used as the sole basis for treatment or other patient management decisions. Negative results must be combined with clinical observations, patient history, and epidemiological information. The expected result is Negative.  Fact Sheet for Patients: SugarRoll.be  Fact Sheet for Healthcare Providers: https://www.woods-mathews.com/  This test is not yet approved or cleared by the [place] FDA and  has been authorized for detection and/or diagnosis of SARS-CoV-2 by FDA under an Emergency Use Authorization (EUA). This EUA will remain  in effect (meaning this test can be used) for the duration of the COVID-19 declaration under Se ction 564(b)(1) of the Act, 21 U.S.C. section 360bbb-3(b)(1), unless the authorization is terminated or revoked sooner.  Performed at [facility], [place] [address] [place], [place] [address]   Glucose, capillary     Status: None   Collection Time: 04/08/22 10:05 AM  Result Value Ref Range   Glucose-Capillary 97 70 - 99 mg/dL    Comment: Glucose reference range applies only to samples taken after fasting for at least 8 hours.  Basic metabolic panel     Status: Abnormal   Collection Time: 05/10/22 10:19 AM  Result Value Ref Range   Sodium 140 135 - 145 mmol/L   Potassium 4.1 3.5 - 5.1 mmol/L   Chloride 103 98 - 111 mmol/L   CO2 29 22 - 32 mmol/L   Glucose, Bld 86 70 - 99 mg/dL    Comment: Glucose reference range applies only to samples taken after  fasting for at least 8 hours.   BUN 7 (L) 8 - 23 mg/dL   Creatinine, Ser 0.53 (L) 0.61 - 1.24 mg/dL   Calcium 9.5 8.9 - 10.3 mg/dL   GFR, Estimated >60 >60 mL/min    Comment: (NOTE) Calculated using the CKD-EPI Creatinine Equation (2021)    Anion gap 8 5 -  15    Comment: Performed at [facility], [place], [place] [address]  SARS CORONAVIRUS 2 (TAT 6-24 HRS) Anterior Nasal Swab     Status: None   Collection Time: 05/10/22 10:19 AM   Specimen: Anterior Nasal Swab  Result Value Ref Range   SARS Coronavirus 2 NEGATIVE NEGATIVE    Comment: (NOTE) SARS-CoV-2 target nucleic acids are NOT DETECTED.  The SARS-CoV-2 RNA is generally detectable in upper and lower respiratory specimens during the acute phase of infection. Negative results do not preclude SARS-CoV-2 infection, do not rule out co-infections with other pathogens, and should not be used as the sole basis for treatment or other patient management decisions. Negative results must be combined with clinical observations, patient history, and epidemiological information. The expected result is Negative.  Fact Sheet for Patients: SugarRoll.be  Fact Sheet for Healthcare Providers: https://www.woods-mathews.com/  This test is not yet approved or cleared by the [place] FDA and  has been authorized for detection and/or diagnosis of SARS-CoV-2 by FDA under an Emergency Use Authorization (EUA). This EUA will remain  in effect (meaning this test can be used) for the duration of the COVID-19 declaration under Se ction 564(b)(1) of the Act, 21 U.S.C. section 360bbb-3(b)(1), unless the authorization is terminated or revoked sooner.  Performed at [facility], [place] [address] [place], [place] [address]   Surgical pathology     Status: None   Collection Time: 05/12/22  1:10 PM  Result Value Ref Range   SURGICAL PATHOLOGY      SURGICAL PATHOLOGY CASE:  [id number] PATIENT: Caroleen Hamman Surgical Pathology Report     Specimen Submitted: A. Lung, RUL  Clinical History: 71 year old smoker with 3.8 cm hypermetabolic right upper lobe lung mass      DIAGNOSIS: A.  LUNG, RIGHT UPPER LOBE; ENB-ASSISTED BIOPSY: - SQUAMOUS CELL CARCINOMA.  There is sufficient material for ancillary molecular studies.  See concurrent cases [id number], -[id number], and -756.  GROSS DESCRIPTION: A. Labeled: RUL biopsy Received: The specimen is collected during an ENB procedure and placed in formalin. Collection time: 1:10 PM on 05/12/2022 Placed into formalin time: 1:10 PM on 05/12/2022 Tissue fragment(s): Multiple Size: Aggregate, 1.6 x 0.7 x 0.2 cm Description: Received are fragments of red-tan soft tissue.  At the time of the procedure 2 Diff-Quik stained slides are performed. Entirely submitted in 1 cassette.  RB 05/12/2022  Final Diagnosis performed by Quay Burow, MD.   Electronically signed 9/21/202 3 9:58:01AM The electronic signature indicates that the named Attending Pathologist has evaluated the specimen Technical component performed at [place], [address] [place],  [address] Lab: [phone] Dir: Rush Farmer, MD, MMM  Professional component performed at [facility], [facility], [place], [place],  [address] Lab: [phone] Dir: Kathi Simpers, MD   Cytology - Non PAP; RUL     Status: None   Collection Time: 05/12/22  1:19 PM  Result Value Ref Range   CYTOLOGY - NON GYN      CYTOLOGY - NON PAP CASE: [id number] PATIENT: Caroleen Hamman Non-Gynecological Cytology Report     Specimen Submitted: A. Lung, RUL; brushing  Clinical History: 71 year old smoker with 3.8 cm hypermetabolic right upper lobe lung mass      DIAGNOSIS: A.  LUNG, RIGHT UPPER LOBE; EBUS-ASSISTED BRUSHING: - POSITIVE FOR MALIGNANCY. - SQUAMOUS CELL CARCINOMA IS PRESENT.  The specimen is adequate for  interpretation.  See concurrent cases [id number], -[id number], and [id number].  GROSS DESCRIPTION: A. Site: RUL Procedure: EBUS Cytology specimen: Brushing Cytotechnologist(s): Laurine Blazer, Juanetta Beets Specimen material collected and submitted for: 2 diff Quik stained slides 0 Pap stained slides Specimen material submitted for: Cell block and ThinPrep  The cell block material is fixed in formalin for 6 hours prior to processing.  Specimen description: Fixative: CytoLyt Volume: Approximately 15 mL Color: Pink Transparency: Clear Tissue fragments present:  Yes Collection brush(es) present: Yes, 1  RB 05/12/2022    Final Diagnosis performed by Quay Burow, MD.   Electronically signed 05/13/2022 2:03:23PM The electronic signature indicates that the named Attending Pathologist has evaluated the specimen Technical component performed at [place], [address], [place], [place] [address] Lab: [phone] Dir: Rush Farmer, MD, MMM  Professional component performed at [facility], [facility], [place], [place], [place] [address] Lab: [phone] Dir: Kathi Simpers, MD   Cytology - Non PAP; RUL     Status: None   Collection Time: 05/12/22  1:20 PM  Result Value Ref Range   CYTOLOGY - NON GYN      CYTOLOGY - NON PAP CASE: [id number] PATIENT: Caroleen Hamman Non-Gynecological Cytology Report     Specimen Submitted: A. Lung, RUL; lavage  Clinical History: 71 year old smoker with 3.8 cm hypermetabolic right upper lobe lung mass      DIAGNOSIS: A.  LUNG, RIGHT UPPER LOBE; EBUS-ASSISTED LAVAGE: - POSITIVE FOR MALIGNANCY. - SQUAMOUS CELL CARCINOMA IS PRESENT.  The specimen is adequate for interpretation.  See concurrent cases [id number], -[id number], and [id number].  GROSS DESCRIPTION: A. Site: RUL Procedure: EBUS Cytology specimen: Lavage Cytotechnologist(s): Laurine Blazer, Juanetta Beets Specimen material collected and submitted for: 0  diff Quik stained slides 0 Pap stained slides Specimen material submitted for: Cell block and ThinPrep  The cell block material is fixed in formalin for 6 hours prior to processing.  Specimen description: Fixative: CytoLyt Volume: Approximately 30 mL Color: Red Transparency: Slightly cloudy Tissue fragments prese nt: Yes Collection brush(es) present: No  RB 05/12/2022    Final Diagnosis performed by Quay Burow, MD.   Electronically signed 05/13/2022 2:00:00PM The electronic signature indicates that the named Attending Pathologist has evaluated the specimen Technical component performed at [place], [address] [place], [place] [address] Lab: [phone] Dir: Rush Farmer, MD, MMM  Professional component performed at [facility], [facility], [place], [place], [place] [address] Lab: [phone] Dir: Kathi Simpers, MD   Cytology - Non PAP;     Status: None   Collection Time: 05/12/22  1:29 PM  Result Value Ref Range   CYTOLOGY - NON GYN      CYTOLOGY - NON PAP CASE: [id number] PATIENT: Caroleen Hamman Non-Gynecological Cytology Report     Specimen Submitted: A. Subcarinal space, right; FNA  Clinical History: 71 year old smoker with 3.8 cm hypermetabolic right upper lobe lung mass      DIAGNOSIS: A.  SUBCARINAL SPACE, RIGHT; EBUS-ASSISTED FNA: - POSITIVE FOR MALIGNANCY. - SQUAMOUS CELL CARCINOMA IS PRESENT.  The specimen is adequate for interpretation.  See concurrent cases [id number], -[id number], and [id number].   GROSS DESCRIPTION: A. Site: Right subcarinal space Procedure: EBUS Cytology specimen: FNA/needle biopsy, rinse Cytotechnologist(s): Laurine Blazer, Juanetta Beets Specimen material collected and submitted for: 1 diff Quik stained slides 1 Pap stained slides Specimen material submitted for: Cell block and ThinPrep  The cell block material is fixed in formalin for 6 hours prior to processing.  Specimen  description: Fixative: CytoLyt Volume: Approximately 20 mL Color: Colorless  Transparency: Clear Tissue fragments present: Yes Collection brush(es) present: No  RB 05/12/2022     Final Diagnosis performed by Quay Burow, MD.   Electronically signed 05/13/2022 2:05:21PM The electronic signature indicates that the named Attending Pathologist has evaluated the specimen Technical component performed at [facility], [address], [place], [place] [address] Lab: [phone] Dir: Rush Farmer, MD, MMM  Professional component performed at [facility], [facility], [place], [place], [place] [address] Lab: [phone] Dir: Kathi Simpers, MD   Comprehensive metabolic panel     Status: Abnormal   Collection Time: 06/03/22  8:41 AM  Result Value Ref Range   Sodium 134 (L) 135 - 145 mmol/L   Potassium 3.6 3.5 - 5.1 mmol/L   Chloride 100 98 - 111 mmol/L   CO2 26 22 - 32 mmol/L   Glucose, Bld 89 70 - 99 mg/dL    Comment: Glucose reference range applies only to samples taken after fasting for at least 8 hours.   BUN 6 (L) 8 - 23 mg/dL   Creatinine, Ser 0.53 (L) 0.61 - 1.24 mg/dL   Calcium 8.8 (L) 8.9 - 10.3 mg/dL   Total Protein 7.6 6.5 - 8.1 g/dL   Albumin 3.8 3.5 - 5.0 g/dL   AST 16 15 - 41 U/L   ALT 12 0 - 44 U/L   Alkaline Phosphatase 116 38 - 126 U/L   Total Bilirubin 0.8 0.3 - 1.2 mg/dL   GFR, Estimated >60 >60 mL/min    Comment: (NOTE) Calculated using the CKD-EPI Creatinine Equation (2021)    Anion gap 8 5 - 15    Comment: Performed at [facility], [place] [place], [place] [address]  CBC with Differential     Status: Abnormal   Collection Time: 06/03/22  8:41 AM  Result Value Ref Range   WBC 4.6 4.0 - 10.5 K/uL   RBC 4.13 (L) 4.22 - 5.81 MIL/uL   Hemoglobin 12.7 (L) 13.0 - 17.0 g/dL   HCT 36.1 (L) 39.0 - 52.0 %   MCV 87.4 80.0 - 100.0 fL   MCH 30.8 26.0 - 34.0 pg   MCHC 35.2 30.0 - 36.0 g/dL   RDW 12.9 11.5 - 15.5 %   Platelets  414 (H) 150 - 400 K/uL   nRBC 0.0 0.0 - 0.2 %   Neutrophils Relative % 48 %   Neutro Abs 2.2 1.7 - 7.7 K/uL   Lymphocytes Relative 34 %   Lymphs Abs 1.6 0.7 - 4.0 K/uL   Monocytes Relative 11 %   Monocytes Absolute 0.5 0.1 - 1.0 K/uL   Eosinophils Relative 6 %   Eosinophils Absolute 0.3 0.0 - 0.5 K/uL   Basophils Relative 1 %   Basophils Absolute 0.1 0.0 - 0.1 K/uL   Immature Granulocytes 0 %   Abs Immature Granulocytes 0.01 0.00 - 0.07 K/uL    Comment: Performed at [facility], [address], [place], [place] [address]  Rad Sandria Senter Session Summary     Status: None   Collection Time: 06/03/22  1:29 PM  Result Value Ref Range   Course ID [email or web address]    Course Intent Unknown    Course Start Date 05/25/2022 11:51 AM    Session Number 1    Course First Treatment Date 06/03/2022  1:26 PM    Course Last Treatment Date 06/03/2022  1:27 PM    Course Elapsed Days 0    [place] ID Lung_R DP  [place] Dosage Given to Date 2 Gy   [place] Session Dosage Given 2 Gy   Plan ID Lung_R_IMRT    Plan Name Chest_Rt    Plan Fractions Treated to Date 1    Plan Total Fractions Prescribed 35    Plan Prescribed Dose Per Fraction 2 Gy   Plan Total Prescribed Dose 70.000000 Gy   Plan Primary [place] Lung_R DP   Rad Onc Aria Session Summary     Status: None   Collection Time: 06/04/22 10:48 AM  Result Value Ref Range   Course ID [email or web address]    Course Intent Unknown    Course Start Date 05/25/2022 11:51 AM    Session Number 2    Course First Treatment Date 06/03/2022  1:26 PM    Course Last Treatment Date 06/04/2022 10:46 AM    Course Elapsed Days 1    [place] ID Lung_R DP    [place] Dosage Given to Date 4 Gy   [place] Session Dosage Given 2 Gy   Plan ID Lung_R_IMRT    Plan Name Chest_Rt    Plan Fractions Treated to Date 2    Plan Total Fractions Prescribed 35    Plan Prescribed Dose Per Fraction 2 Gy   Plan Total Prescribed Dose  70.000000 Gy   Plan Primary [place] Lung_R DP   Rad Onc [place] Session Summary     Status: None   Collection Time: 06/08/22 11:46 AM  Result Value Ref Range   Course ID C1_Chest    Course Intent Unknown    Course Start Date 05/25/2022 11:51 AM    Session Number 3    Course First Treatment Date 06/03/2022  1:26 PM    Course Last Treatment Date 06/08/2022 11:44 AM    Course Elapsed Days 5    [place] ID Lung_R DP    [place] Dosage Given to Date 6 Gy   [place] Session Dosage Given 2 Gy   Plan ID Lung_R_IMRT    Plan Name Chest_Rt    Plan Fractions Treated to Date 3    Plan Total Fractions Prescribed 35    Plan Prescribed Dose Per Fraction 2 Gy   Plan Total Prescribed Dose 70.000000 Gy   Plan Primary [place] Lung_R DP   Rad Onc Aria Session Summary     Status: None   Collection Time: 06/09/22 11:53 AM  Result Value Ref Range   Course ID C1_Chest    Course Intent Unknown    Course Start Date 05/25/2022 11:51 AM    Session Number 4    Course First Treatment Date 06/03/2022  1:26 PM    Course Last Treatment Date 06/09/2022 11:51 AM    Course Elapsed Days 6    [place] ID Lung_R DP    [place] Dosage Given to Date 8 Gy   [place] Session Dosage Given 2 Gy   Plan ID Lung_R_IMRT    Plan Name Chest_Rt    Plan Fractions Treated to Date 4    Plan Total Fractions Prescribed 35    Plan Prescribed Dose Per Fraction 2 Gy   Plan Total Prescribed Dose 70.000000 Gy   Plan Primary [place] Lung_R DP   Comprehensive metabolic panel     Status: Abnormal   Collection Time: 06/10/22  9:59 AM  Result Value Ref Range   Sodium 134 (L) 135 - 145 mmol/L   Potassium 3.6 3.5 - 5.1 mmol/L   Chloride 97 (  L) 98 - 111 mmol/L   CO2 28 22 - 32 mmol/L   Glucose, Bld 106 (H) 70 - 99 mg/dL    Comment: Glucose reference range applies only to samples taken after fasting for at least 8 hours.   BUN 8 8 - 23 mg/dL   Creatinine, Ser 0.58 (L) 0.61 -  1.24 mg/dL   Calcium 9.0 8.9 - 10.3 mg/dL   Total Protein 7.1 6.5 - 8.1 g/dL   Albumin 3.4 (L) 3.5 - 5.0 g/dL   AST 15 15 - 41 U/L   ALT 10 0 - 44 U/L   Alkaline Phosphatase 110 38 - 126 U/L   Total Bilirubin 0.5 0.3 - 1.2 mg/dL   GFR, Estimated >60 >60 mL/min    Comment: (NOTE) Calculated using the CKD-EPI Creatinine Equation (2021)    Anion gap 9 5 - 15    Comment: Performed at [facility], [address] [place], [place] [address]  CBC with Differential     Status: Abnormal   Collection Time: 06/10/22  9:59 AM  Result Value Ref Range   WBC 4.6 4.0 - 10.5 K/uL   RBC 3.71 (L) 4.22 - 5.81 MIL/uL   Hemoglobin 11.4 (L) 13.0 - 17.0 g/dL   HCT 33.5 (L) 39.0 - 52.0 %   MCV 90.3 80.0 - 100.0 fL   MCH 30.7 26.0 - 34.0 pg   MCHC 34.0 30.0 - 36.0 g/dL   RDW 13.1 11.5 - 15.5 %   Platelets 363 150 - 400 K/uL   nRBC 0.0 0.0 - 0.2 %   Neutrophils Relative % 60 %   Neutro Abs 2.8 1.7 - 7.7 K/uL   Lymphocytes Relative 21 %   Lymphs Abs 1.0 0.7 - 4.0 K/uL   Monocytes Relative 14 %   Monocytes Absolute 0.6 0.1 - 1.0 K/uL   Eosinophils Relative 3 %   Eosinophils Absolute 0.1 0.0 - 0.5 K/uL   Basophils Relative 1 %   Basophils Absolute 0.1 0.0 - 0.1 K/uL   Immature Granulocytes 1 %   Abs Immature Granulocytes 0.03 0.00 - 0.07 K/uL    Comment: Performed at [facility], [address] [place], [place] [address]  Rad Sandria Senter Session Summary     Status: None   Collection Time: 06/10/22 10:58 AM  Result Value Ref Range   Course ID [email or web address]    Course Intent Unknown    Course Start Date 05/25/2022 11:51 AM    Session Number 5    Course First Treatment Date 06/03/2022  1:26 PM    Course Last Treatment Date 06/10/2022 10:56 AM    Course Elapsed Days 7    Reference Point ID Lung_R DP    Reference Point Dosage Given to Date 10 Gy   Reference Point Session Dosage Given 2 Gy   Plan ID Lung_R_IMRT    Plan Name Chest_Rt    Plan Fractions Treated to Date 5    Plan Total  Fractions Prescribed 35    Plan Prescribed Dose Per Fraction 2 Gy   Plan Total Prescribed Dose 70.000000 Gy   Plan Primary Reference Point Lung_R DP   Rad Onc Aria Session Summary     Status: None   Collection Time: 06/11/22 10:55 AM  Result Value Ref Range   Course ID C1_Chest    Course Intent Unknown    Course Start Date 05/25/2022 11:51 AM    Session Number 6    Course First Treatment Date 06/03/2022  1:26 PM    Course Last Treatment Date 06/11/2022 10:54 AM    Course Elapsed Days 8    Reference Point ID Lung_R DP    Reference Point Dosage Given to Date 12 Gy   Reference Point Session Dosage Given 2 Gy   Plan ID Lung_R_IMRT    Plan Name Chest_Rt    Plan Fractions Treated to Date 6    Plan Total Fractions Prescribed 35    Plan Prescribed Dose Per Fraction 2 Gy   Plan Total Prescribed Dose 70.000000 Gy   Plan Primary Reference Point Lung_R DP   Rad Onc Aria Session Summary     Status: None   Collection Time: 06/14/22 10:44 AM  Result Value Ref Range   Course ID C1_Chest    Course Intent Unknown    Course Start Date 05/25/2022 11:51 AM    Session Number 7    Course First Treatment Date 06/03/2022  1:26 PM    Course Last Treatment Date 06/14/2022 10:43 AM    Course Elapsed Days 11    Reference Point ID Lung_R DP    Reference Point Dosage Given to Date 14 Gy   Reference Point Session Dosage Given 2 Gy   Plan ID Lung_R_IMRT    Plan Name Chest_Rt    Plan Fractions Treated to Date 7    Plan Total Fractions Prescribed 35    Plan Prescribed Dose Per Fraction 2 Gy   Plan Total Prescribed Dose 70.000000 Gy   Plan Primary Reference Point Lung_R DP   Rad Onc Aria Session Summary     Status: None   Collection Time: 06/15/22 10:26 AM  Result Value Ref Range   Course ID C1_Chest    Course Intent Unknown    Course Start Date 05/25/2022 11:51 AM    Session Number 8    Course First Treatment Date 06/03/2022  1:26 PM    Course Last Treatment Date 06/15/2022 10:24 AM    Course Elapsed  Days 12    Reference Point ID Lung_R DP    Reference Point Dosage Given to Date 16 Gy   Reference Point Session Dosage Given 2 Gy   Plan ID Lung_R_IMRT    Plan Name Chest_Rt    Plan Fractions Treated to Date 8    Plan Total Fractions Prescribed 35    Plan Prescribed Dose Per Fraction 2 Gy   Plan Total Prescribed Dose 70.000000 Gy   Plan Primary Reference Point Lung_R DP   Rad Onc Aria Session Summary     Status: None   Collection Time: 06/16/22 10:26 AM  Result Value Ref Range   Course ID C1_Chest    Course Intent Unknown    Course Start Date 05/25/2022 11:51 AM    Session Number 9    Course First Treatment Date 06/03/2022  1:26 PM    Course Last Treatment Date 06/16/2022 10:25 AM    Course Elapsed Days 13    Reference Point ID Lung_R DP    Reference Point Dosage Given to Date 18 Gy   Reference Point Session Dosage Given 2 Gy   Plan ID Lung_R_IMRT    Plan Name Chest_Rt    Plan Fractions Treated to Date 9    Plan Total Fractions Prescribed 35    Plan Prescribed Dose Per Fraction 2 Gy   Plan Total Prescribed Dose 70.000000 Gy   Plan Primary Reference Point Lung_R DP   Comprehensive metabolic panel     Status: Abnormal   Collection Time:  06/17/22  9:51 AM  Result Value Ref Range   Sodium 136 135 - 145 mmol/L   Potassium 3.8 3.5 - 5.1 mmol/L   Chloride 102 98 - 111 mmol/L   CO2 27 22 - 32 mmol/L   Glucose, Bld 111 (H) 70 - 99 mg/dL    Comment: Glucose reference range applies only to samples taken after fasting for at least 8 hours.   BUN 6 (L) 8 - 23 mg/dL   Creatinine, Ser 0.49 (L) 0.61 - 1.24 mg/dL   Calcium 9.2 8.9 - 10.3 mg/dL   Total Protein 7.6 6.5 - 8.1 g/dL   Albumin 3.6 3.5 - 5.0 g/dL   AST 16 15 - 41 U/L   ALT 15 0 - 44 U/L   Alkaline Phosphatase 108 38 - 126 U/L   Total Bilirubin 0.4 0.3 - 1.2 mg/dL   GFR, Estimated >60 >60 mL/min    Comment: (NOTE) Calculated using the CKD-EPI Creatinine Equation (2021)    Anion gap 7 5 - 15    Comment: Performed at [facility] [address] [place], [place] [address]  CBC with Differential     Status: Abnormal   Collection Time: 06/17/22  9:51 AM  Result Value Ref Range   WBC 4.5 4.0 - 10.5 K/uL   RBC 3.77 (L) 4.22 - 5.81 MIL/uL   Hemoglobin 11.6 (L) 13.0 - 17.0 g/dL   HCT 34.2 (L) 39.0 - 52.0 %   MCV 90.7 80.0 - 100.0 fL   MCH 30.8 26.0 - 34.0 pg   MCHC 33.9 30.0 - 36.0 g/dL   RDW 13.3 11.5 - 15.5 %   Platelets 485 (H) 150 - 400 K/uL   nRBC 0.0 0.0 - 0.2 %   Neutrophils Relative % 65 %   Neutro Abs 3.0 1.7 - 7.7 K/uL   Lymphocytes Relative 22 %   Lymphs Abs 1.0 0.7 - 4.0 K/uL   Monocytes Relative 10 %   Monocytes Absolute 0.5 0.1 - 1.0 K/uL   Eosinophils Relative 2 %   Eosinophils Absolute 0.1 0.0 - 0.5 K/uL   Basophils Relative 1 %   Basophils Absolute 0.0 0.0 - 0.1 K/uL   Immature Granulocytes 0 %   Abs Immature Granulocytes 0.02 0.00 - 0.07 K/uL    Comment: Performed at [facility], [address] [place], [place] [address]  Rad Sandria Senter Session Summary     Status: None   Collection Time: 06/17/22 10:22 AM  Result Value Ref Range   Course ID C1_Chest    Course Intent Unknown    Course Start Date 05/25/2022 11:51 AM    Session Number 10    Course First Treatment Date 06/03/2022  1:26 PM    Course Last Treatment Date 06/17/2022 10:21 AM    Course Elapsed Days 14    Reference Point ID Lung_R DP    Reference Point Dosage Given to Date 20 Gy   Reference Point Session Dosage Given 2 Gy   Plan ID Lung_R_IMRT    Plan Name Chest_Rt    Plan Fractions Treated to Date 10    Plan Total Fractions Prescribed 35    Plan Prescribed Dose Per Fraction 2 Gy   Plan Total Prescribed Dose 70.000000 Gy   Plan Primary Reference Point Lung_R DP   Rad Onc Aria Session Summary     Status: None   Collection Time: 06/18/22 10:18 AM  Result Value Ref Range   Course ID C1_Chest  Course Intent Unknown    Course Start Date 05/25/2022 11:51 AM    Session Number 11    Course First Treatment  Date 06/03/2022  1:26 PM    Course Last Treatment Date 06/18/2022 10:16 AM    Course Elapsed Days 15    Reference Point ID Lung_R DP    Reference Point Dosage Given to Date 22 Gy   Reference Point Session Dosage Given 2 Gy   Plan ID Lung_R_IMRT    Plan Name Chest_Rt    Plan Fractions Treated to Date 11    Plan Total Fractions Prescribed 35    Plan Prescribed Dose Per Fraction 2 Gy   Plan Total Prescribed Dose 70.000000 Gy   Plan Primary Reference Point Lung_R DP   Rad Onc Aria Session Summary     Status: None   Collection Time: 06/21/22 10:25 AM  Result Value Ref Range   Course ID C1_Chest    Course Intent Unknown    Course Start Date 05/25/2022 11:51 AM    Session Number 12    Course First Treatment Date 06/03/2022  1:26 PM    Course Last Treatment Date 06/21/2022 10:23 AM    Course Elapsed Days 18    Reference Point ID Lung_R DP    Reference Point Dosage Given to Date 24 Gy   Reference Point Session Dosage Given 2 Gy   Plan ID Lung_R_IMRT    Plan Name Chest_Rt    Plan Fractions Treated to Date 12    Plan Total Fractions Prescribed 35    Plan Prescribed Dose Per Fraction 2 Gy   Plan Total Prescribed Dose 70.000000 Gy   Plan Primary Reference Point Lung_R DP     Radiology: No results found.  No results found.  IR IMAGING GUIDED PORT INSERTION  Result Date: 05/28/2022 INDICATION: Lung malignancy EXAM: IMPLANTED PORT A CATH PLACEMENT WITH ULTRASOUND AND FLUOROSCOPIC GUIDANCE MEDICATIONS: None ANESTHESIA/SEDATION: Moderate (conscious) sedation was employed during this procedure. A total of Versed 1 mg and Fentanyl 50 mcg was administered intravenously by the radiology nurse. Total intra-service moderate Sedation Time: 34 minutes. The patient's level of consciousness and vital signs were monitored continuously by radiology nursing throughout the procedure under my direct supervision. FLUOROSCOPY: Radiation Exposure Index (as provided by the fluoroscopic device): 58.5 mGy Kerma  COMPLICATIONS: None immediate. PROCEDURE: The procedure, risks, benefits, and alternatives were explained to the patient. Questions regarding the procedure were encouraged and answered. The patient understands and consents to the procedure. A timeout was performed prior to the initiation of the procedure. Patient positioned supine on the angiography table. Right neck and anterior upper chest prepped and draped in the usual sterile fashion. All elements of maximal sterile barrier were utilized including, cap, mask, sterile gown, sterile gloves, large sterile drape, hand scrubbing and 2% Chlorhexidine for skin cleaning. The right internal jugular vein was evaluated with ultrasound and shown to be patent. A permanent ultrasound image was obtained and placed in the patient's medical record. Local anesthesia was provided with 1% lidocaine with epinephrine. Using sterile gel and a sterile probe cover, the right internal jugular vein was entered with a 21 ga needle during real time ultrasound guidance. 0.018 inch guidewire placed and 21 ga needle exchanged for transitional dilator set. Utilizing fluoroscopy, 0.035 inch guidewire advanced to the IVC without difficulty. Attention then turned to the right anterior upper chest. Following local lidocaine administration, a port pocket was created. The catheter was connected to the port and brought from the pocket  to the venotomy site through a subcutaneous tunnel. The catheter was cut to size and inserted through the peel-away sheath. However, the catheter tip only extended to the mid SVC. The catheter was removed over 0.035 inch stiff Glidewire. New catheter was cut 3 cm longer. Peel-away sheath was placed through the tunnel into the right internal jugular vein and the new catheter was inserted through the peel-away sheath. The tip was positioned at the cavoatrial junction. The catheter was then connected to the port. The port was placed in the pocket. The port aspirated and  flushed well. The port pocket was closed with deep and superficial absorbable suture. The port pocket incision and venotomy sites were also sealed with Dermabond. IMPRESSION: Right IJ chest port is ready for use. Electronically Signed   By: Miachel Roux M.D.   On: 05/28/2022 13:02   MR Brain W Wo Contrast  Result Date: 05/25/2022 CLINICAL DATA:  Non-small cell lung cancer, evaluate for metastases EXAM: MRI HEAD WITHOUT AND WITH CONTRAST TECHNIQUE: Multiplanar, multiecho pulse sequences of the brain and surrounding structures were obtained without and with intravenous contrast. CONTRAST:  7.5 mL Vueway COMPARISON:  MRI head 04/22/2008 FINDINGS: Brain: No abnormal parenchymal or meningeal enhancement. No restricted diffusion to suggest acute or subacute infarct. No acute hemorrhage, mass, mass effect, or midline shift. No hydrocephalus or extra-axial collection. Encephalomalacia in the bilateral posterior temporal, lateral occipital, and inferior parietal lobes, sequela of remote infarcts. Sequela of remote infarcts in the left basal ganglia and left frontal white matter. Vascular: Normal arterial flow voids. Normal arterial and venous enhancement. Skull and upper cervical spine: Small enhancing foci in the right parietal calvarium (series 18, image 132 and 135) and right occipital calvarium (series 18, image 83). Otherwise normal marrow signal. Sinuses/Orbits: Mucous retention cyst in the left maxillary sinus. Otherwise clear paranasal sinuses. The orbits are unremarkable. Other: Trace fluid in the right mastoid air cells. IMPRESSION: 1. No evidence of metastatic disease in the brain. 2. Small enhancing foci in the right parietal and right occipital calvarium, which are indeterminate but could represent small osseous metastases. Attention on follow-up. Electronically Signed   By: Merilyn Baba M.D.   On: 05/25/2022 19:26      Assessment and Plan: Patient Active Problem List   Diagnosis Date Noted    Encounter for antineoplastic chemotherapy 06/03/2022   History of lung cancer 04/21/2020   Seizures ([place]) 07/20/2019   Encounter for therapeutic drug level monitoring 07/20/2019   Need for vaccination against Streptococcus pneumoniae using pneumococcal conjugate vaccine 13 03/05/2019   GERD (gastroesophageal reflux disease) 02/08/2018   Obstructive chronic bronchitis without exacerbation 02/08/2018   Encounter for general adult medical examination with abnormal findings 12/18/2017   Atherosclerosis of aorta ([place]) 12/18/2017   Dysuria 12/18/2017   Cerebrovascular accident (CVA) ([place]) 11/01/2017   HOH (hard of hearing) 11/01/2017   Essential hypertension 11/01/2017   Hyperlipidemia 11/01/2017   Hx of adenomatous polyp of colon 12/16/2016   Squamous cell lung cancer ([place])    Lung cancer, 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[place]) 05/07/2012    1. 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  2. Obstructive chronic bronchitis without exacerbation Patient is on inhalers he should be continued we will continue refill as warranted  General Counseling: I have discussed the findings of the evaluation and examination with Jeneen Rinks.  I have also discussed any further diagnostic evaluation thatmay be needed or ordered today. Jeneen Rinks verbalizes  understanding of the findings of todays visit. We also reviewed his medications today and discussed drug interactions and side effects including but not limited excessive drowsiness and altered mental states. We also discussed that there is always a risk not just to him but also people around him. he has been encouraged to call the office with any questions or concerns that should arise related to todays visit.  No orders of the defined types were placed in this encounter.    Time spent: 55  I have personally obtained a history, examined the patient, evaluated laboratory and imaging results,  formulated the assessment and plan and placed orders.    Allyne Gee, MD [facility] Pulmonary and Critical Care Sleep medicine

## 2022-06-22 ENCOUNTER — Ambulatory Visit
Admission: RE | Admit: 2022-06-22 | Discharge: 2022-06-22 | Disposition: A | Discharge status: Home / Self Care | Payer: Medicare Other | Source: Ambulatory Visit | Attending: Radiation Oncology | Admitting: Radiation Oncology

## 2022-06-22 ENCOUNTER — Ambulatory Visit: Payer: Medicare Other

## 2022-06-22 ENCOUNTER — Other Ambulatory Visit: Payer: Self-pay

## 2022-06-22 DIAGNOSIS — Z87891 Personal history of nicotine dependence: Secondary | ICD-10-CM | POA: Diagnosis not present

## 2022-06-22 DIAGNOSIS — C3411 Malignant neoplasm of upper lobe, right bronchus or lung: Secondary | ICD-10-CM | POA: Diagnosis not present

## 2022-06-22 DIAGNOSIS — Z510 Encounter for antineoplastic radiation therapy: Secondary | ICD-10-CM | POA: Diagnosis not present

## 2022-06-22 DIAGNOSIS — Z5111 Encounter for antineoplastic chemotherapy: Secondary | ICD-10-CM | POA: Diagnosis not present

## 2022-06-22 LAB — RAD ONC ARIA SESSION SUMMARY
Course Elapsed Days: 19
Plan Fractions Treated to Date: 13
Plan Prescribed Dose Per Fraction: 2 Gy
Plan Total Fractions Prescribed: 35
Plan Total Prescribed Dose: 70 Gy
Reference Point Dosage Given to Date: 26 Gy
Reference Point Session Dosage Given: 2 Gy
Session Number: 13

## 2022-06-23 ENCOUNTER — Other Ambulatory Visit: Payer: Self-pay

## 2022-06-23 ENCOUNTER — Ambulatory Visit
Admission: RE | Admit: 2022-06-23 | Discharge: 2022-06-23 | Disposition: A | Discharge status: Home / Self Care | Payer: Medicare Other | Source: Ambulatory Visit | Attending: Radiation Oncology | Admitting: Radiation Oncology

## 2022-06-23 ENCOUNTER — Ambulatory Visit: Payer: Medicare Other

## 2022-06-23 DIAGNOSIS — J449 Chronic obstructive pulmonary disease, unspecified: Secondary | ICD-10-CM | POA: Insufficient documentation

## 2022-06-23 DIAGNOSIS — D701 Agranulocytosis secondary to cancer chemotherapy: Secondary | ICD-10-CM | POA: Insufficient documentation

## 2022-06-23 DIAGNOSIS — Z452 Encounter for adjustment and management of vascular access device: Secondary | ICD-10-CM | POA: Insufficient documentation

## 2022-06-23 DIAGNOSIS — Z510 Encounter for antineoplastic radiation therapy: Secondary | ICD-10-CM | POA: Insufficient documentation

## 2022-06-23 DIAGNOSIS — C3411 Malignant neoplasm of upper lobe, right bronchus or lung: Secondary | ICD-10-CM | POA: Insufficient documentation

## 2022-06-23 DIAGNOSIS — I10 Essential (primary) hypertension: Secondary | ICD-10-CM | POA: Insufficient documentation

## 2022-06-23 DIAGNOSIS — Z5111 Encounter for antineoplastic chemotherapy: Secondary | ICD-10-CM | POA: Diagnosis not present

## 2022-06-23 DIAGNOSIS — Z87891 Personal history of nicotine dependence: Secondary | ICD-10-CM | POA: Diagnosis not present

## 2022-06-23 LAB — RAD ONC ARIA SESSION SUMMARY
Course Elapsed Days: 20
Plan Fractions Treated to Date: 14
Plan Prescribed Dose Per Fraction: 2 Gy
Plan Total Fractions Prescribed: 35
Plan Total Prescribed Dose: 70 Gy
Reference Point Dosage Given to Date: 28 Gy
Reference Point Session Dosage Given: 2 Gy
Session Number: 14

## 2022-06-24 ENCOUNTER — Ambulatory Visit: Payer: Medicare Other

## 2022-06-24 ENCOUNTER — Encounter: Payer: Self-pay | Admitting: Internal Medicine

## 2022-06-24 ENCOUNTER — Inpatient Hospital Stay: Payer: Medicare Other

## 2022-06-24 ENCOUNTER — Encounter: Payer: Self-pay | Admitting: Nurse Practitioner

## 2022-06-24 ENCOUNTER — Other Ambulatory Visit: Payer: Self-pay

## 2022-06-24 ENCOUNTER — Ambulatory Visit
Admission: RE | Admit: 2022-06-24 | Discharge: 2022-06-24 | Disposition: A | Discharge status: Home / Self Care | Payer: Medicare Other | Source: Ambulatory Visit | Attending: Radiation Oncology | Admitting: Radiation Oncology

## 2022-06-24 ENCOUNTER — Ambulatory Visit: Payer: Medicare Other | Admitting: Pulmonary Disease

## 2022-06-24 ENCOUNTER — Inpatient Hospital Stay: Payer: Medicare Other | Attending: Internal Medicine | Admitting: Nurse Practitioner

## 2022-06-24 VITALS — BP 116/79 | HR 73 | Temp 97.7°F | Wt 166.0 lb

## 2022-06-24 DIAGNOSIS — Z5111 Encounter for antineoplastic chemotherapy: Secondary | ICD-10-CM

## 2022-06-24 DIAGNOSIS — Z510 Encounter for antineoplastic radiation therapy: Secondary | ICD-10-CM | POA: Diagnosis not present

## 2022-06-24 DIAGNOSIS — C3490 Malignant neoplasm of unspecified part of unspecified bronchus or lung: Secondary | ICD-10-CM

## 2022-06-24 DIAGNOSIS — J449 Chronic obstructive pulmonary disease, unspecified: Secondary | ICD-10-CM | POA: Diagnosis not present

## 2022-06-24 DIAGNOSIS — Z95828 Presence of other vascular implants and grafts: Secondary | ICD-10-CM

## 2022-06-24 DIAGNOSIS — C3491 Malignant neoplasm of unspecified part of right bronchus or lung: Secondary | ICD-10-CM

## 2022-06-24 DIAGNOSIS — Z87891 Personal history of nicotine dependence: Secondary | ICD-10-CM | POA: Diagnosis not present

## 2022-06-24 DIAGNOSIS — I10 Essential (primary) hypertension: Secondary | ICD-10-CM | POA: Diagnosis not present

## 2022-06-24 DIAGNOSIS — C3411 Malignant neoplasm of upper lobe, right bronchus or lung: Secondary | ICD-10-CM | POA: Diagnosis not present

## 2022-06-24 DIAGNOSIS — D701 Agranulocytosis secondary to cancer chemotherapy: Secondary | ICD-10-CM | POA: Diagnosis not present

## 2022-06-24 DIAGNOSIS — Z452 Encounter for adjustment and management of vascular access device: Secondary | ICD-10-CM | POA: Diagnosis not present

## 2022-06-24 LAB — COMPREHENSIVE METABOLIC PANEL
ALT: 12 U/L (ref 0–44)
AST: 13 U/L — ABNORMAL LOW (ref 15–41)
Albumin: 3.5 g/dL (ref 3.5–5.0)
Alkaline Phosphatase: 109 U/L (ref 38–126)
Anion gap: 5 (ref 5–15)
BUN: 8 mg/dL (ref 8–23)
CO2: 28 mmol/L (ref 22–32)
Calcium: 9.2 mg/dL (ref 8.9–10.3)
Chloride: 102 mmol/L (ref 98–111)
Creatinine, Ser: 0.37 mg/dL — ABNORMAL LOW (ref 0.61–1.24)
GFR, Estimated: >60 mL/min (ref >60)
Glucose, Bld: 99 mg/dL (ref 70–99)
Potassium: 4 mmol/L (ref 3.5–5.1)
Sodium: 135 mmol/L (ref 135–145)
Total Bilirubin: 0.5 mg/dL (ref 0.3–1.2)
Total Protein: 7.4 g/dL (ref 6.5–8.1)

## 2022-06-24 LAB — RAD ONC ARIA SESSION SUMMARY
Course Elapsed Days: 21
Plan Fractions Treated to Date: 15
Plan Prescribed Dose Per Fraction: 2 Gy
Plan Total Fractions Prescribed: 35
Plan Total Prescribed Dose: 70 Gy
Reference Point Dosage Given to Date: 30 Gy
Reference Point Session Dosage Given: 2 Gy
Session Number: 15

## 2022-06-24 LAB — CBC WITH DIFFERENTIAL/PLATELET
Abs Immature Granulocytes: 0.03 10*3/uL (ref 0.00–0.07)
Basophils Absolute: 0 10*3/uL (ref 0.0–0.1)
Basophils Relative: 1 %
Eosinophils Absolute: 0.1 10*3/uL (ref 0.0–0.5)
Eosinophils Relative: 2 %
HCT: 33.3 % — ABNORMAL LOW (ref 39.0–52.0)
Hemoglobin: 11.3 g/dL — ABNORMAL LOW (ref 13.0–17.0)
Immature Granulocytes: 1 %
Lymphocytes Relative: 16 %
Lymphs Abs: 0.6 10*3/uL — ABNORMAL LOW (ref 0.7–4.0)
MCH: 30.5 pg (ref 26.0–34.0)
MCHC: 33.9 g/dL (ref 30.0–36.0)
MCV: 90 fL (ref 80.0–100.0)
Monocytes Absolute: 0.6 10*3/uL (ref 0.1–1.0)
Monocytes Relative: 15 %
Neutro Abs: 2.4 10*3/uL (ref 1.7–7.7)
Neutrophils Relative %: 65 %
Platelets: 477 10*3/uL — ABNORMAL HIGH (ref 150–400)
RBC: 3.7 MIL/uL — ABNORMAL LOW (ref 4.22–5.81)
RDW: 13.5 % (ref 11.5–15.5)
WBC: 3.7 10*3/uL — ABNORMAL LOW (ref 4.0–10.5)
nRBC: 0 % (ref 0.0–0.2)

## 2022-06-24 MED ORDER — SODIUM CHLORIDE 0.9 % IV SOLN
45.0000 mg/m2 | Freq: Once | INTRAVENOUS | Status: AC
  Administered 2022-06-24: 84 mg via INTRAVENOUS
  Filled 2022-06-24: qty 14

## 2022-06-24 MED ORDER — HEPARIN SOD (PORK) LOCK FLUSH 100 UNIT/ML IV SOLN
500.0000 [IU] | Freq: Once | INTRAVENOUS | Status: AC | PRN
  Administered 2022-06-24: 500 [IU]
  Filled 2022-06-24: qty 5

## 2022-06-24 MED ORDER — SODIUM CHLORIDE 0.9 % IV SOLN
200.8000 mg | Freq: Once | INTRAVENOUS | Status: AC
  Administered 2022-06-24: 200 mg via INTRAVENOUS
  Filled 2022-06-24: qty 20

## 2022-06-24 MED ORDER — SODIUM CHLORIDE 0.9 % IV SOLN
10.0000 mg | Freq: Once | INTRAVENOUS | Status: AC
  Administered 2022-06-24: 10 mg via INTRAVENOUS
  Filled 2022-06-24: qty 10

## 2022-06-24 MED ORDER — SODIUM CHLORIDE 0.9% FLUSH
10.0000 mL | INTRAVENOUS | Status: DC | PRN
  Administered 2022-06-24: 10 mL via INTRAVENOUS
  Filled 2022-06-24: qty 10

## 2022-06-24 MED ORDER — SODIUM CHLORIDE 0.9 % IV SOLN
Freq: Once | INTRAVENOUS | Status: AC
  Filled 2022-06-24: qty 250

## 2022-06-24 MED ORDER — PALONOSETRON HCL INJECTION 0.25 MG/5ML
0.2500 mg | Freq: Once | INTRAVENOUS | Status: AC
  Administered 2022-06-24: 0.25 mg via INTRAVENOUS
  Filled 2022-06-24: qty 5

## 2022-06-24 MED ORDER — DIPHENHYDRAMINE HCL 50 MG/ML IJ SOLN
50.0000 mg | Freq: Once | INTRAMUSCULAR | Status: AC
  Administered 2022-06-24: 50 mg via INTRAVENOUS
  Filled 2022-06-24: qty 1

## 2022-06-24 MED ORDER — FAMOTIDINE IN NACL 20-0.9 MG/50ML-% IV SOLN
20.0000 mg | Freq: Once | INTRAVENOUS | Status: AC
  Administered 2022-06-24: 20 mg via INTRAVENOUS
  Filled 2022-06-24: qty 50

## 2022-06-24 MED ORDER — SODIUM CHLORIDE 0.9% FLUSH
10.0000 mL | INTRAVENOUS | Status: DC | PRN
  Administered 2022-06-24: 10 mL
  Filled 2022-06-24: qty 10

## 2022-06-24 NOTE — Patient Instructions (Signed)
[facility] CANCER [facility] AT [place]  Discharge Instructions: Thank you for choosing [facility] to provide your oncology and hematology care.  If you have a lab appointment with the [facility], please go directly to the [facility] and check in at the registration area.  Wear comfortable clothing and clothing appropriate for easy access to any Portacath or PICC line.   We strive to give you quality time with your provider. You may need to reschedule your appointment if you arrive late (15 or more minutes).  Arriving late affects you and other patients whose appointments are after yours.  Also, if you miss three or more appointments without notifying the office, you may be dismissed from the clinic at the provider's discretion.      For prescription refill requests, have your pharmacy contact our office and allow 72 hours for refills to be completed.    Today you received the following chemotherapy and/or immunotherapy agents- Taxol, Carboplatin      To help prevent nausea and vomiting after your treatment, we encourage you to take your nausea medication as directed.  BELOW ARE SYMPTOMS THAT SHOULD BE REPORTED IMMEDIATELY: *FEVER GREATER THAN 100.4 F (38 C) OR HIGHER *CHILLS OR SWEATING *NAUSEA AND VOMITING THAT IS NOT CONTROLLED WITH YOUR NAUSEA MEDICATION *UNUSUAL SHORTNESS OF BREATH *UNUSUAL BRUISING OR BLEEDING *URINARY PROBLEMS (pain or burning when urinating, or frequent urination) *BOWEL PROBLEMS (unusual diarrhea, constipation, pain near the anus) TENDERNESS IN MOUTH AND THROAT WITH OR WITHOUT PRESENCE OF ULCERS (sore throat, sores in mouth, or a toothache) UNUSUAL RASH, SWELLING OR PAIN  UNUSUAL VAGINAL DISCHARGE OR ITCHING   Items with * indicate a potential emergency and should be followed up as soon as possible or go to the Emergency Department if any problems should occur.  Please show the CHEMOTHERAPY ALERT CARD or IMMUNOTHERAPY ALERT CARD at  check-in to the Emergency Department and triage nurse.  Should you have questions after your visit or need to cancel or reschedule your appointment, please contact [facility] CANC[facility] [place] AT [place]  [phone] and follow the prompts.  Office hours are 8:00 a.m. to 4:30 p.m. Monday - Friday. Please note that voicemails left after 4:00 p.m. may not be returned until the following business day.  We are closed weekends and major holidays. You have access to a nurse at all times for urgent questions. Please call the main number to the clinic [phone] and follow the prompts.  For any non-urgent questions, you may also contact your provider using MyChart. We now offer e-Visits for anyone 31 and older to request care online for non-urgent symptoms. For details visit [email or web address].   Also download the MyChart app! Go to the app store, search "MyChart", open the app, select [facility], and log in with your MyChart username and password.  Masks are optional in the cancer centers. If you would like for your care team to wear a mask while they are taking care of you, please let them know. For doctor visits, patients may have with them one support person who is at least 69 years old. At this time, visitors are not allowed in the infusion area.

## 2022-06-24 NOTE — Patient Instructions (Signed)

## 2022-06-24 NOTE — Progress Notes (Signed)
[place] CONSULT NOTE  Patient Care Team: Derek Webb, Derek Webb as PCP - General (Physician Assistant) Derek Webb, [facility] as Pharmacist (Pharmacist) Telford Nab, RN as Oncology Nurse Navigator  REFERRING PROVIDER: Dr. Patsey Webb  REASON FOR REFFERAL: newly diagnosed SCCa of right lung  CANCER STAGING   Cancer Staging  Squamous cell lung cancer [facility]) Staging form: Lung, AJCC 7th Edition - Clinical: Stage IIIA (T4, N0, M0) - Signed by Derek Canary, MD on 06/03/2022  CURRENT TREATMENT- Weekly Carboplatin and Taxol with RT started 06/03/2022.   HISTORY OF PRESENTING ILLNESS:  Derek Webb 71 y.o. 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  Patient was initially seen today accompanied by her Sister Derek Webb.  Denies any cough, hemoptysis, shortness of breath and weight loss. Remote smoker.  Quit in 2013. Has family history of lung cancer in father.  Summary of oncologic history is as follows: Oncology History Overview Note  History of left upper lobe lung SCC status post microwave thermal ablation in 2013 by Dr. Kathlene Webb.  He was deemed not a surgical candidate and had transportation issues for radiation treatment.   Squamous cell lung cancer ([place])  01/12/2022 Initial Diagnosis   Patient has been following with Dr. Humphrey Webb of pulmonary for suspicious lung nodule. CT chest in March 2021 showed post ablation scarring in LUL not changed and increased atelectasis of RML and RUL. He did not follow through.   CT chest in 01/12/2022 showed  New abrupt and slightly irregular cut off of the right upper lobe bronchus with associated right upper lobe atelectasis and postobstructive changes. Findings are concerning for endobronchial malignancy    04/08/2022 PET scan    IMPRESSION: 1. Hypermetabolic central right upper lobe lung mass nearly occluding the right upper lobe bronchus, poorly delineated on the noncontrast CT images, measuring approximately 3.8 x 2.9 cm, compatible with primary bronchogenic malignancy. 2. Postobstructive pneumonia/atelectasis throughout the basilar right upper lobe. 3. No hypermetabolic metastatic thoracic adenopathy or distant metastatic disease. 4. Stable mild post ablation change in the left upper lobe with no metabolic evidence of local tumor recurrence in this location. 5. Dilated 4.4 cm ascending thoracic aorta. Recommend annual imaging followup by CTA or MRA   05/12/2022 Procedure   Procedure was delayed due to miscommunication about holding Plavix. S/p bronchoscopy with EBUS by Dr. Patsey Webb. Op note mentions significant bulky adenopathy in the subcarinal space and in the right hilum.    05/12/2022 Pathology Results   DIAGNOSIS:  A.  LUNG, RIGHT UPPER LOBE; ENB-ASSISTED BIOPSY:  - SQUAMOUS CELL CARCINOMA.   LUNG, RIGHT UPPER LOBE; EBUS-ASSISTED BRUSHING:  - POSITIVE FOR MALIGNANCY.  - SQUAMOUS CELL CARCINOMA IS PRESENT.   LUNG, RIGHT UPPER LOBE; EBUS-ASSISTED LAVAGE:  - POSITIVE FOR MALIGNANCY.  - SQUAMOUS CELL CARCINOMA IS PRESENT  SUBCARINAL SPACE, RIGHT; EBUS-ASSISTED FNA:  - POSITIVE FOR MALIGNANCY.  - SQUAMOUS CELL CARCINOMA IS PRESENT   05/18/2022 Cancer Staging   Staging form: Lung, AJCC 7th Edition - Clinical: Stage IIIA (T4, N0, M0) - Signed by Derek Canary, MD on 06/03/2022   05/21/2022 Imaging   MRI brain  No evidence of metastatic disease in the brain. 2. Small enhancing foci in the right parietal and right occipital calvarium, which are indeterminate but could represent small osseous metastases. Attention on follow-up  06/03/2022 -  Chemotherapy   Patient is on Treatment Plan : LUNG Carboplatin + Paclitaxel + XRT q7d       INTERVAL HISTORY- Patient is 71 year old male, currently  receiving carbo-taxol with concurrent radiation. Says he's tolerating it well. No fevers, chills, interval infections. Denies nausea, vomiting, worsening shortness of breath, cough, pain, or diarrhea. Appetite is stable. Denies pain.    MEDICAL HISTORY:  Past Medical History:  Diagnosis Date   Alcoholism ([place])    COPD (chronic obstructive pulmonary disease) (HCC)    SEVERE COPD -OCCASIONALLY USES OXYGEN AT NIGHT-DOES NOT USE INHALERS ON REGULAR BASIS   Elevated cholesterol    GERD (gastroesophageal reflux disease)    HOH (hard of hearing)    Hypertension    Lung cancer ([place])    LEFT UPPER LUNG CARCINOMA-NOT A CANDIDATE FOR SURGICAL RESECTION BECAUSE OF HIS COPD AND POOR CANDIDATE FOR RADIATION BECAUSE OF TRANSPORTATION PROBLEMS   Seizures ([place])    CHRONIC DILATIN - PT STATES NO SEIZURES IN PAST COUPLE OF YEARS; PT STATES HIS SEIZURES CAUSED NUMBNESS OF ARMS AND HANDS AND NOT ABLE TO MOVE HIS ARMS   Stroke ([place])    2 TO 3 YRS AGO-AFFECTED HIS SPEECH--ABLE TO AMBULATE WITHOUT ASSIST AND DOES YARD WORK.  DECREASED HEARING IN BOTH EARS SINCE STROKE.   Vitamin D deficiency     SURGICAL HISTORY: Past Surgical History:  Procedure Laterality Date   CAROTID SURGERY 2009 -LEFT     COLON POLYP EXCISION     COLON SURGERY     COLONOSCOPY WITH PROPOFOL N/A 04/18/2017   Procedure: COLONOSCOPY WITH PROPOFOL;  Surgeon: Manya Silvas, MD;  Location: [facility] ENDOSCOPY;  Service: Endoscopy;  Laterality: N/A;   ESOPHAGOGASTRODUODENOSCOPY     IR GENERIC HISTORICAL  01/21/2016   IR RADIOLOGIST EVAL & MGMT 01/21/2016 Aletta Edouard, MD [facility] INTERV RAD   IR GENERIC HISTORICAL  10/22/2014   IR RADIOLOGIST EVAL & MGMT 10/22/2014 Aletta Edouard, MD [facility] INTERV RAD   IR IMAGING GUIDED PORT INSERTION  05/28/2022   IR RADIOLOGIST EVAL & MGMT  02/02/2017   IR RADIOLOGIST EVAL & MGMT  03/08/2018   percutaneous biopsy     VIDEO BRONCHOSCOPY WITH ENDOBRONCHIAL ULTRASOUND Right 05/12/2022   Procedure: VIDEO  BRONCHOSCOPY WITH ENDOBRONCHIAL ULTRASOUND;  Surgeon: Derek Pita, MD;  Location: [facility] ORS;  Service: Cardiopulmonary;  Laterality: Right;    SOCIAL HISTORY: Social History   Socioeconomic History   Marital status: Single    Spouse name: Not on file   Number of children: Not on file   Years of education: Not on file   Highest education level: Not on file  Occupational History   Not on file  Tobacco Use   Smoking status: Former    Packs/day: 1.00    Years: 35.00    Total pack years: 35.00    Types: Cigarettes    Quit date: 12/22/2011    Years since quitting: 10.5   Smokeless tobacco: Never  Vaping Use   Vaping Use: Never used  Substance and Sexual Activity   Alcohol use: Yes    Comment: occosionally    Drug use: No   Sexual activity: Not on file  Other Topics Concern   Not on file  Social History Narrative   Not on file   Social Determinants of Health   Financial Resource Strain: Low Risk  (11/28/2020)   Overall Financial Resource Strain (CARDIA)    Difficulty of Paying Living Expenses: Not hard at  all  Food Insecurity: No Food Insecurity (06/15/2022)   Hunger Vital Sign    Worried About Running Out of Food in the Last Year: Never true    Ran Out of Food in the Last Year: Never true  Transportation Needs: No Transportation Needs (06/15/2022)   PRAPARE - Hydrologist (Medical): No    Lack of Transportation (Non-Medical): No  Physical Activity: Inactive (06/15/2022)   Exercise Vital Sign    Days of Exercise per Week: 0 days    Minutes of Exercise per Session: 0 min  Stress: Stress Concern Present (06/15/2022)   [place]    Feeling of Stress : To some extent  Social Connections: Socially Isolated (06/15/2022)   Social Connection and Isolation Panel [NHANES]    Frequency of Communication with Friends and Family: More than three times a week    Frequency of Social  Gatherings with Friends and Family: More than three times a week    Attends Religious Services: Never    Marine scientist or Organizations: No    Attends Archivist Meetings: Never    Marital Status: Never married  Intimate Partner Violence: Unknown (06/15/2022)   Humiliation, Afraid, Rape, and Kick questionnaire    Fear of Current or Ex-Partner: No    Emotionally Abused: Not on file    Physically Abused: No    Sexually Abused: No    FAMILY HISTORY: Family History  Problem Relation Age of Onset   Colon polyps Sister     ALLERGIES:  has No Known Allergies.  MEDICATIONS:  Current Outpatient Medications  Medication Sig Dispense Refill   aspirin 81 MG tablet Take 81 mg by mouth daily.     atorvastatin (LIPITOR) 80 MG tablet Take 1 tablet (80 mg total) by mouth daily. (Patient taking differently: Take 80 mg by mouth at bedtime.) 90 tablet 3   budesonide-formoterol (SYMBICORT) 160-4.5 MCG/ACT inhaler Inhale 1 puff into the lungs 2 (two) times daily. 1 each 5   Cholecalciferol (VITAMIN D) 2000 UNITS CAPS Take by mouth. VITAMIN D 3 2000 IU  ONE SOFTGEL DAILY  --OTC     clopidogrel (PLAVIX) 75 MG tablet Take 1 tablet (75 mg total) by mouth daily. 90 tablet 3   dexamethasone (DECADRON) 4 MG tablet Take 2 tablets daily for 2 days, start the day after chemotherapy. Take with food. 30 tablet 1   enalapril (VASOTEC) 20 MG tablet Take 1 tablet (20 mg total) by mouth daily. 90 tablet 3   ezetimibe (ZETIA) 10 MG tablet Take 1 tablet (10 mg total) by mouth daily. 90 tablet 3   lidocaine-prilocaine (EMLA) cream Apply to affected area once 30 g 3   ondansetron (ZOFRAN) 8 MG tablet Take 1 tablet (8 mg total) by mouth every 8 (eight) hours as needed for nausea or vomiting. Start on the third day after chemotherapy. 30 tablet 1   OXYGEN Inhale into the lungs. At night     pantoprazole (PROTONIX) 40 MG tablet Take 1 tablet (40 mg total) by mouth daily. IN AM 90 tablet 0   phenytoin  (DILANTIN) 100 MG ER capsule Take 4 capsules (400 mg total) by mouth at bedtime. 450 capsule 1   prochlorperazine (COMPAZINE) 10 MG tablet Take 1 tablet (10 mg total) by mouth every 6 (six) hours as needed for nausea or vomiting. 30 tablet 1   No current facility-administered medications for this visit.  REVIEW OF SYSTEMS:   Review of Systems  Constitutional:  Negative for chills, fever, malaise/fatigue and weight loss.  HENT:  Negative for hearing loss, nosebleeds, sore throat and tinnitus.   Eyes:  Negative for blurred vision and double vision.  Respiratory:  Negative for cough, hemoptysis, shortness of breath and wheezing.   Cardiovascular:  Negative for chest pain, palpitations and leg swelling.  Gastrointestinal:  Negative for abdominal pain, blood in stool, constipation, diarrhea, melena, nausea and vomiting.  Genitourinary:  Negative for dysuria and urgency.  Musculoskeletal:  Negative for back pain, falls, joint pain and myalgias.  Skin:  Negative for itching and rash.  Neurological:  Negative for dizziness, tingling, sensory change, loss of consciousness, weakness and headaches.  Endo/Heme/Allergies:  Negative for environmental allergies. Does not bruise/bleed easily.  Psychiatric/Behavioral:  Negative for depression. The patient is not nervous/anxious and does not have insomnia.     PHYSICAL EXAMINATION: ECOG PERFORMANCE STATUS: 2 - Symptomatic, <50% confined to bed  Vitals:   06/24/22 0836  BP: 116/79  Pulse: 73  Temp: 97.7 F (36.5 C)   Filed Weights   06/24/22 0836  Weight: 166 lb (75.3 kg)   General: Well-developed, well-nourished, no acute distress. Eyes: Pink conjunctiva, anicteric sclera. Lungs: inspiratory and expiratory wheezes bilaterally R > L. No audible cough.  Heart: Regular rate and rhythm.  Abdomen: Soft, nontender, nondistended.  Musculoskeletal: No edema, cyanosis, or clubbing. Neuro: Alert, answering all questions appropriately. Cranial nerves  grossly intact. Skin: No rashes or petechiae noted. Psych: flat, somewhat disengaged affect. Limited historian   LABORATORY DATA:  I have reviewed the data as listed Lab Results  Component Value Date   WBC 3.7 (L) 06/24/2022   HGB 11.3 (L) 06/24/2022   HCT 33.3 (L) 06/24/2022   MCV 90.0 06/24/2022   PLT 477 (H) 06/24/2022   Recent Labs    06/10/22 0959 06/17/22 0951 06/24/22 0818  NA 134* 136 135  K 3.6 3.8 4.0  CL 97* 102 102  CO2 28 27 28   GLUCOSE 106* 111* 99  BUN 8 6* 8  CREATININE 0.58* 0.49* 0.37*  CALCIUM 9.0 9.2 9.2  GFRNONAA >60 >60 >60  PROT 7.1 7.6 7.4  ALBUMIN 3.4* 3.6 3.5  AST 15 16 13*  ALT 10 15 12   ALKPHOS 110 108 109  BILITOT 0.5 0.4 0.5    RADIOGRAPHIC STUDIES: I have personally reviewed the radiological images as listed and agreed with the findings in the report. IR IMAGING GUIDED PORT INSERTION  Result Date: 05/28/2022 INDICATION: Lung malignancy EXAM: IMPLANTED PORT A CATH PLACEMENT WITH ULTRASOUND AND FLUOROSCOPIC GUIDANCE MEDICATIONS: None ANESTHESIA/SEDATION: Moderate (conscious) sedation was employed during this procedure. A total of Versed 1 mg and Fentanyl 50 mcg was administered intravenously by the radiology nurse. Total intra-service moderate Sedation Time: 34 minutes. The patient's level of consciousness and vital signs were monitored continuously by radiology nursing throughout the procedure under my direct supervision. FLUOROSCOPY: Radiation Exposure Index (as provided by the fluoroscopic device): 16.1 mGy Kerma COMPLICATIONS: None immediate. PROCEDURE: The procedure, risks, benefits, and alternatives were explained to the patient. Questions regarding the procedure were encouraged and answered. The patient understands and consents to the procedure. A timeout was performed prior to the initiation of the procedure. Patient positioned supine on the angiography table. Right neck and anterior upper chest prepped and draped in the usual sterile  fashion. All elements of maximal sterile barrier were utilized including, cap, mask, sterile gown, sterile gloves, large sterile drape, hand scrubbing  and 2% Chlorhexidine for skin cleaning. The right internal jugular vein was evaluated with ultrasound and shown to be patent. A permanent ultrasound image was obtained and placed in the patient's medical record. Local anesthesia was provided with 1% lidocaine with epinephrine. Using sterile gel and a sterile probe cover, the right internal jugular vein was entered with a 21 ga needle during real time ultrasound guidance. 0.018 inch guidewire placed and 21 ga needle exchanged for transitional dilator set. Utilizing fluoroscopy, 0.035 inch guidewire advanced to the IVC without difficulty. Attention then turned to the right anterior upper chest. Following local lidocaine administration, a port pocket was created. The catheter was connected to the port and brought from the pocket to the venotomy site through a subcutaneous tunnel. The catheter was cut to size and inserted through the peel-away sheath. However, the catheter tip only extended to the mid SVC. The catheter was removed over 0.035 inch stiff Glidewire. New catheter was cut 3 cm longer. Peel-away sheath was placed through the tunnel into the right internal jugular vein and the new catheter was inserted through the peel-away sheath. The tip was positioned at the cavoatrial junction. The catheter was then connected to the port. The port was placed in the pocket. The port aspirated and flushed well. The port pocket was closed with deep and superficial absorbable suture. The port pocket incision and venotomy sites were also sealed with Dermabond. IMPRESSION: Right IJ chest port is ready for use. Electronically Signed   By: Miachel Roux M.D.   On: 05/28/2022 13:02    ASSESSMENT & PLAN:  Derek Webb 71 y.o. 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  #RUL of lung SCCa, atleast Stage IIIA (cT4N0M0)- Imaging showed large 4 cm mass in RUL, partially obstructing RUL bronchus involving the hilum and subcarinal space with postobstructive pneumonia. No lymphadenopathy. S/p bronchoscopy with EBUS with Dr. Patsey Webb on 05/12/22. MRI brain was negative for metastatic disease in brain, small enhancing foci in the right parietal and right occipital calvarium, indeterminate but possibly small osseous metastases. Not a surgical candidate. He is currently s/p cycle 3 of carbo-paclitaxel with concurrent radiation.   Labs reviewed and acceptable for continuation of treatment. Proceed with cycle 4 of carbo (AUC 2) and paclitaxel (45 mg/m2) today. He will receive radiation today as well. Tolerating well.   We reviewed use of antiemetics including compazine and zofran. He will continue decadron for 2 days after chemotherapy.   # Access - port placed on 05/28/2022. He reports having EMLA cream on hand.   RTC in one week for port/labs, Dr Darrall Dears, +/- carbo-taxol cycle 5 (IS)- la   All questions were answered. The patient knows to call the clinic with any problems, questions or concerns. No barriers to learning was detected.  Verlon Au, NP 06/24/2022

## 2022-06-25 ENCOUNTER — Ambulatory Visit: Payer: Medicare Other

## 2022-06-25 ENCOUNTER — Ambulatory Visit
Admission: RE | Admit: 2022-06-25 | Discharge: 2022-06-25 | Disposition: A | Discharge status: Home / Self Care | Payer: Medicare Other | Source: Ambulatory Visit | Attending: Radiation Oncology | Admitting: Radiation Oncology

## 2022-06-25 ENCOUNTER — Other Ambulatory Visit: Payer: Self-pay

## 2022-06-25 DIAGNOSIS — D701 Agranulocytosis secondary to cancer chemotherapy: Secondary | ICD-10-CM | POA: Diagnosis not present

## 2022-06-25 DIAGNOSIS — Z510 Encounter for antineoplastic radiation therapy: Secondary | ICD-10-CM | POA: Diagnosis not present

## 2022-06-25 DIAGNOSIS — Z87891 Personal history of nicotine dependence: Secondary | ICD-10-CM | POA: Diagnosis not present

## 2022-06-25 DIAGNOSIS — Z5111 Encounter for antineoplastic chemotherapy: Secondary | ICD-10-CM | POA: Diagnosis not present

## 2022-06-25 DIAGNOSIS — C3411 Malignant neoplasm of upper lobe, right bronchus or lung: Secondary | ICD-10-CM | POA: Diagnosis not present

## 2022-06-25 DIAGNOSIS — I10 Essential (primary) hypertension: Secondary | ICD-10-CM | POA: Diagnosis not present

## 2022-06-25 DIAGNOSIS — J449 Chronic obstructive pulmonary disease, unspecified: Secondary | ICD-10-CM | POA: Diagnosis not present

## 2022-06-25 DIAGNOSIS — Z452 Encounter for adjustment and management of vascular access device: Secondary | ICD-10-CM | POA: Diagnosis not present

## 2022-06-25 LAB — RAD ONC ARIA SESSION SUMMARY
Course Elapsed Days: 22
Plan Fractions Treated to Date: 16
Plan Prescribed Dose Per Fraction: 2 Gy
Plan Total Fractions Prescribed: 35
Plan Total Prescribed Dose: 70 Gy
Reference Point Dosage Given to Date: 32 Gy
Reference Point Session Dosage Given: 2 Gy
Session Number: 16

## 2022-06-28 ENCOUNTER — Inpatient Hospital Stay: Payer: Medicare Other | Admitting: Licensed Clinical Social Worker

## 2022-06-28 ENCOUNTER — Other Ambulatory Visit: Payer: Self-pay

## 2022-06-28 ENCOUNTER — Ambulatory Visit: Payer: Medicare Other

## 2022-06-28 ENCOUNTER — Ambulatory Visit
Admission: RE | Admit: 2022-06-28 | Discharge: 2022-06-28 | Disposition: A | Discharge status: Home / Self Care | Payer: Medicare Other | Source: Ambulatory Visit | Attending: Radiation Oncology | Admitting: Radiation Oncology

## 2022-06-28 DIAGNOSIS — C3411 Malignant neoplasm of upper lobe, right bronchus or lung: Secondary | ICD-10-CM | POA: Diagnosis not present

## 2022-06-28 DIAGNOSIS — Z5111 Encounter for antineoplastic chemotherapy: Secondary | ICD-10-CM | POA: Diagnosis not present

## 2022-06-28 DIAGNOSIS — J449 Chronic obstructive pulmonary disease, unspecified: Secondary | ICD-10-CM | POA: Diagnosis not present

## 2022-06-28 DIAGNOSIS — D701 Agranulocytosis secondary to cancer chemotherapy: Secondary | ICD-10-CM | POA: Diagnosis not present

## 2022-06-28 DIAGNOSIS — Z87891 Personal history of nicotine dependence: Secondary | ICD-10-CM | POA: Diagnosis not present

## 2022-06-28 DIAGNOSIS — Z452 Encounter for adjustment and management of vascular access device: Secondary | ICD-10-CM | POA: Diagnosis not present

## 2022-06-28 DIAGNOSIS — I10 Essential (primary) hypertension: Secondary | ICD-10-CM | POA: Diagnosis not present

## 2022-06-28 DIAGNOSIS — Z510 Encounter for antineoplastic radiation therapy: Secondary | ICD-10-CM | POA: Diagnosis not present

## 2022-06-28 LAB — RAD ONC ARIA SESSION SUMMARY
Course Elapsed Days: 25
Plan Fractions Treated to Date: 17
Plan Prescribed Dose Per Fraction: 2 Gy
Plan Total Fractions Prescribed: 35
Plan Total Prescribed Dose: 70 Gy
Reference Point Dosage Given to Date: 34 Gy
Reference Point Session Dosage Given: 2 Gy
Session Number: 17

## 2022-06-29 ENCOUNTER — Other Ambulatory Visit: Payer: Self-pay

## 2022-06-29 ENCOUNTER — Ambulatory Visit (INDEPENDENT_AMBULATORY_CARE_PROVIDER_SITE_OTHER): Payer: Medicare Other

## 2022-06-29 ENCOUNTER — Ambulatory Visit: Payer: Medicare Other

## 2022-06-29 ENCOUNTER — Telehealth: Payer: Self-pay

## 2022-06-29 ENCOUNTER — Ambulatory Visit
Admission: RE | Admit: 2022-06-29 | Discharge: 2022-06-29 | Disposition: A | Discharge status: Home / Self Care | Payer: Medicare Other | Source: Ambulatory Visit | Attending: Radiation Oncology | Admitting: Radiation Oncology

## 2022-06-29 DIAGNOSIS — D701 Agranulocytosis secondary to cancer chemotherapy: Secondary | ICD-10-CM | POA: Diagnosis not present

## 2022-06-29 DIAGNOSIS — Z5111 Encounter for antineoplastic chemotherapy: Secondary | ICD-10-CM | POA: Diagnosis not present

## 2022-06-29 DIAGNOSIS — E538 Deficiency of other specified B group vitamins: Secondary | ICD-10-CM | POA: Diagnosis not present

## 2022-06-29 DIAGNOSIS — I10 Essential (primary) hypertension: Secondary | ICD-10-CM | POA: Diagnosis not present

## 2022-06-29 DIAGNOSIS — C3411 Malignant neoplasm of upper lobe, right bronchus or lung: Secondary | ICD-10-CM | POA: Diagnosis not present

## 2022-06-29 DIAGNOSIS — Z87891 Personal history of nicotine dependence: Secondary | ICD-10-CM | POA: Diagnosis not present

## 2022-06-29 DIAGNOSIS — Z452 Encounter for adjustment and management of vascular access device: Secondary | ICD-10-CM | POA: Diagnosis not present

## 2022-06-29 DIAGNOSIS — Z510 Encounter for antineoplastic radiation therapy: Secondary | ICD-10-CM | POA: Diagnosis not present

## 2022-06-29 DIAGNOSIS — J449 Chronic obstructive pulmonary disease, unspecified: Secondary | ICD-10-CM | POA: Diagnosis not present

## 2022-06-29 LAB — RAD ONC ARIA SESSION SUMMARY
Course Elapsed Days: 26
Plan Fractions Treated to Date: 18
Plan Prescribed Dose Per Fraction: 2 Gy
Plan Total Fractions Prescribed: 35
Plan Total Prescribed Dose: 70 Gy
Reference Point Dosage Given to Date: 36 Gy
Reference Point Session Dosage Given: 2 Gy
Session Number: 18

## 2022-06-29 MED ORDER — CYANOCOBALAMIN 1000 MCG/ML IJ SOLN
1000.0000 ug | Freq: Once | INTRAMUSCULAR | Status: AC
  Administered 2022-06-29: 1000 ug via INTRAMUSCULAR

## 2022-06-29 NOTE — Telephone Encounter (Signed)
Patients sister [name] stated that her brother Derek Webb has an appt scheduled at the cancer center and has requested to reschedule his colonoscopy.  He is currently asleep.  I informed her that she can call me back to let me know what date would work for rescheduling.  Procedure has been canceled with Trish in Endo.  Thanks,  [place], [place]

## 2022-06-30 ENCOUNTER — Other Ambulatory Visit: Payer: Self-pay

## 2022-06-30 ENCOUNTER — Ambulatory Visit
Admission: RE | Admit: 2022-06-30 | Discharge: 2022-06-30 | Disposition: A | Discharge status: Home / Self Care | Payer: Medicare Other | Source: Ambulatory Visit | Attending: Radiation Oncology | Admitting: Radiation Oncology

## 2022-06-30 ENCOUNTER — Ambulatory Visit: Payer: Medicare Other

## 2022-06-30 DIAGNOSIS — D701 Agranulocytosis secondary to cancer chemotherapy: Secondary | ICD-10-CM | POA: Diagnosis not present

## 2022-06-30 DIAGNOSIS — Z510 Encounter for antineoplastic radiation therapy: Secondary | ICD-10-CM | POA: Diagnosis not present

## 2022-06-30 DIAGNOSIS — Z5111 Encounter for antineoplastic chemotherapy: Secondary | ICD-10-CM | POA: Diagnosis not present

## 2022-06-30 DIAGNOSIS — I10 Essential (primary) hypertension: Secondary | ICD-10-CM | POA: Diagnosis not present

## 2022-06-30 DIAGNOSIS — Z452 Encounter for adjustment and management of vascular access device: Secondary | ICD-10-CM | POA: Diagnosis not present

## 2022-06-30 DIAGNOSIS — C3411 Malignant neoplasm of upper lobe, right bronchus or lung: Secondary | ICD-10-CM | POA: Diagnosis not present

## 2022-06-30 DIAGNOSIS — Z87891 Personal history of nicotine dependence: Secondary | ICD-10-CM | POA: Diagnosis not present

## 2022-06-30 DIAGNOSIS — J449 Chronic obstructive pulmonary disease, unspecified: Secondary | ICD-10-CM | POA: Diagnosis not present

## 2022-06-30 LAB — RAD ONC ARIA SESSION SUMMARY
Course Elapsed Days: 27
Plan Fractions Treated to Date: 19
Plan Prescribed Dose Per Fraction: 2 Gy
Plan Total Fractions Prescribed: 35
Plan Total Prescribed Dose: 70 Gy
Reference Point Dosage Given to Date: 38 Gy
Reference Point Session Dosage Given: 2 Gy
Session Number: 19

## 2022-07-01 ENCOUNTER — Other Ambulatory Visit: Payer: Self-pay

## 2022-07-01 ENCOUNTER — Ambulatory Visit
Admission: RE | Admit: 2022-07-01 | Discharge: 2022-07-01 | Disposition: A | Discharge status: Home / Self Care | Payer: Medicare Other | Source: Ambulatory Visit | Attending: Radiation Oncology | Admitting: Radiation Oncology

## 2022-07-01 ENCOUNTER — Ambulatory Visit: Payer: Medicare Other

## 2022-07-01 DIAGNOSIS — C3411 Malignant neoplasm of upper lobe, right bronchus or lung: Secondary | ICD-10-CM | POA: Diagnosis not present

## 2022-07-01 DIAGNOSIS — Z452 Encounter for adjustment and management of vascular access device: Secondary | ICD-10-CM | POA: Diagnosis not present

## 2022-07-01 DIAGNOSIS — D701 Agranulocytosis secondary to cancer chemotherapy: Secondary | ICD-10-CM | POA: Diagnosis not present

## 2022-07-01 DIAGNOSIS — Z510 Encounter for antineoplastic radiation therapy: Secondary | ICD-10-CM | POA: Diagnosis not present

## 2022-07-01 DIAGNOSIS — I10 Essential (primary) hypertension: Secondary | ICD-10-CM | POA: Diagnosis not present

## 2022-07-01 DIAGNOSIS — J449 Chronic obstructive pulmonary disease, unspecified: Secondary | ICD-10-CM | POA: Diagnosis not present

## 2022-07-01 DIAGNOSIS — Z87891 Personal history of nicotine dependence: Secondary | ICD-10-CM | POA: Diagnosis not present

## 2022-07-01 DIAGNOSIS — Z5111 Encounter for antineoplastic chemotherapy: Secondary | ICD-10-CM | POA: Diagnosis not present

## 2022-07-01 LAB — RAD ONC ARIA SESSION SUMMARY
Course Elapsed Days: 28
Plan Fractions Treated to Date: 20
Plan Prescribed Dose Per Fraction: 2 Gy
Plan Total Fractions Prescribed: 35
Plan Total Prescribed Dose: 70 Gy
Reference Point Dosage Given to Date: 40 Gy
Reference Point Session Dosage Given: 2 Gy
Session Number: 20

## 2022-07-01 MED FILL — Dexamethasone Sodium Phosphate Inj 100 MG/10ML: INTRAMUSCULAR | Qty: 1 | Status: AC

## 2022-07-02 ENCOUNTER — Inpatient Hospital Stay: Payer: Medicare Other

## 2022-07-02 ENCOUNTER — Other Ambulatory Visit: Payer: Self-pay

## 2022-07-02 ENCOUNTER — Inpatient Hospital Stay (HOSPITAL_BASED_OUTPATIENT_CLINIC_OR_DEPARTMENT_OTHER): Payer: Medicare Other | Admitting: Internal Medicine

## 2022-07-02 ENCOUNTER — Encounter: Payer: Self-pay | Admitting: Internal Medicine

## 2022-07-02 ENCOUNTER — Inpatient Hospital Stay: Payer: Medicare Other | Admitting: Licensed Clinical Social Worker

## 2022-07-02 ENCOUNTER — Ambulatory Visit
Admission: RE | Admit: 2022-07-02 | Discharge: 2022-07-02 | Disposition: A | Discharge status: Home / Self Care | Payer: Medicare Other | Source: Ambulatory Visit | Attending: Radiation Oncology | Admitting: Radiation Oncology

## 2022-07-02 ENCOUNTER — Ambulatory Visit: Payer: Medicare Other

## 2022-07-02 VITALS — BP 103/74 | HR 73 | Temp 95.6°F | Resp 20 | Wt 169.9 lb

## 2022-07-02 DIAGNOSIS — C3490 Malignant neoplasm of unspecified part of unspecified bronchus or lung: Secondary | ICD-10-CM

## 2022-07-02 DIAGNOSIS — D701 Agranulocytosis secondary to cancer chemotherapy: Secondary | ICD-10-CM | POA: Diagnosis not present

## 2022-07-02 DIAGNOSIS — J449 Chronic obstructive pulmonary disease, unspecified: Secondary | ICD-10-CM | POA: Diagnosis not present

## 2022-07-02 DIAGNOSIS — C3491 Malignant neoplasm of unspecified part of right bronchus or lung: Secondary | ICD-10-CM

## 2022-07-02 DIAGNOSIS — Z87891 Personal history of nicotine dependence: Secondary | ICD-10-CM | POA: Diagnosis not present

## 2022-07-02 DIAGNOSIS — C3411 Malignant neoplasm of upper lobe, right bronchus or lung: Secondary | ICD-10-CM | POA: Diagnosis not present

## 2022-07-02 DIAGNOSIS — Z510 Encounter for antineoplastic radiation therapy: Secondary | ICD-10-CM | POA: Diagnosis not present

## 2022-07-02 DIAGNOSIS — Z95828 Presence of other vascular implants and grafts: Secondary | ICD-10-CM

## 2022-07-02 DIAGNOSIS — D702 Other drug-induced agranulocytosis: Secondary | ICD-10-CM | POA: Diagnosis not present

## 2022-07-02 DIAGNOSIS — Z452 Encounter for adjustment and management of vascular access device: Secondary | ICD-10-CM | POA: Diagnosis not present

## 2022-07-02 DIAGNOSIS — Z5111 Encounter for antineoplastic chemotherapy: Secondary | ICD-10-CM

## 2022-07-02 DIAGNOSIS — I10 Essential (primary) hypertension: Secondary | ICD-10-CM | POA: Diagnosis not present

## 2022-07-02 LAB — CBC WITH DIFFERENTIAL/PLATELET
Abs Immature Granulocytes: 0 10*3/uL (ref 0.00–0.07)
Basophils Absolute: 0 10*3/uL (ref 0.0–0.1)
Basophils Relative: 2 %
Eosinophils Absolute: 0.1 10*3/uL (ref 0.0–0.5)
Eosinophils Relative: 3 %
HCT: 34.1 % — ABNORMAL LOW (ref 39.0–52.0)
Hemoglobin: 11.5 g/dL — ABNORMAL LOW (ref 13.0–17.0)
Immature Granulocytes: 0 %
Lymphocytes Relative: 30 %
Lymphs Abs: 0.5 10*3/uL — ABNORMAL LOW (ref 0.7–4.0)
MCH: 30.3 pg (ref 26.0–34.0)
MCHC: 33.7 g/dL (ref 30.0–36.0)
MCV: 90 fL (ref 80.0–100.0)
Monocytes Absolute: 0.3 10*3/uL (ref 0.1–1.0)
Monocytes Relative: 17 %
Neutro Abs: 0.9 10*3/uL — ABNORMAL LOW (ref 1.7–7.7)
Neutrophils Relative %: 48 %
Platelets: 371 10*3/uL (ref 150–400)
RBC: 3.79 MIL/uL — ABNORMAL LOW (ref 4.22–5.81)
RDW: 13.8 % (ref 11.5–15.5)
WBC: 1.8 10*3/uL — ABNORMAL LOW (ref 4.0–10.5)
nRBC: 0 % (ref 0.0–0.2)

## 2022-07-02 LAB — COMPREHENSIVE METABOLIC PANEL
ALT: 14 U/L (ref 0–44)
AST: 19 U/L (ref 15–41)
Albumin: 3.6 g/dL (ref 3.5–5.0)
Alkaline Phosphatase: 107 U/L (ref 38–126)
Anion gap: 8 (ref 5–15)
BUN: 9 mg/dL (ref 8–23)
CO2: 26 mmol/L (ref 22–32)
Calcium: 9.3 mg/dL (ref 8.9–10.3)
Chloride: 101 mmol/L (ref 98–111)
Creatinine, Ser: 0.56 mg/dL — ABNORMAL LOW (ref 0.61–1.24)
GFR, Estimated: >60 mL/min (ref >60)
Glucose, Bld: 109 mg/dL — ABNORMAL HIGH (ref 70–99)
Potassium: 4.1 mmol/L (ref 3.5–5.1)
Sodium: 135 mmol/L (ref 135–145)
Total Bilirubin: 0.3 mg/dL (ref 0.3–1.2)
Total Protein: 7.6 g/dL (ref 6.5–8.1)

## 2022-07-02 LAB — RAD ONC ARIA SESSION SUMMARY
Course Elapsed Days: 29
Plan Fractions Treated to Date: 21
Plan Prescribed Dose Per Fraction: 2 Gy
Plan Total Fractions Prescribed: 35
Plan Total Prescribed Dose: 70 Gy
Reference Point Dosage Given to Date: 42 Gy
Reference Point Session Dosage Given: 2 Gy
Session Number: 21

## 2022-07-02 MED ORDER — HEPARIN SOD (PORK) LOCK FLUSH 100 UNIT/ML IV SOLN
500.0000 [IU] | Freq: Once | INTRAVENOUS | Status: AC
  Administered 2022-07-02: 500 [IU] via INTRAVENOUS
  Filled 2022-07-02: qty 5

## 2022-07-02 MED ORDER — SODIUM CHLORIDE 0.9% FLUSH
10.0000 mL | Freq: Once | INTRAVENOUS | Status: AC
  Administered 2022-07-02: 10 mL via INTRAVENOUS
  Filled 2022-07-02: qty 10

## 2022-07-02 MED FILL — Dexamethasone Sodium Phosphate Inj 100 MG/10ML: INTRAMUSCULAR | Qty: 1 | Status: AC

## 2022-07-02 NOTE — Progress Notes (Signed)
Patient has no concerns today. 

## 2022-07-02 NOTE — Progress Notes (Signed)
[place] CONSULT NOTE  Patient Care Team: [name], Ricki Rodriguez as PCP - General (Physician Assistant) Alena Bills, [facility] as Pharmacist (Pharmacist) Telford Nab, RN as Oncology Nurse Navigator  REFERRING PROVIDER: Dr. Patsey Berthold  REASON FOR REFFERAL: newly diagnosed SCCa of right lung  CANCER STAGING   Cancer Staging  Squamous cell lung cancer [facility]) Staging form: Lung, AJCC 7th Edition - Clinical: Stage IIIA (T4, N0, M0) - Signed by [name], MD on 06/03/2022  CURRENT TREATMENT- Weekly Carboplatin and Taxol with RT started 06/03/2022.   ASSESSMENT & PLAN:  Derek Webb 71 y.o. 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  #RUL of lung SCCa, atleast Stage IIIA (cT4N0M0) #Encounter for antineoplastic chemotherapy -PET CT scan was personally reviewed.  There is a large 4 cm mass in the right upper lobe partially obstructing the right upper lobe bronchus, involving the hilum and subcarinal space with postobstructive pneumonia. No lymphadenopathy.   -s/p bronchoscopy with EBUS by Dr. Patsey Berthold on 05/12/2022. -MRI brain on 05/22/2022 personally reviewed.  No evidence of metastatic disease in the brain. Small enhancing foci in the right parietal and right occipital calvarium, which are indeterminate but could represent small osseous metastases. Attention on follow-up. - not a surgical candidate  -Labs reviewed.  CMP unremarkable.  CBC with diff showed WBC 1.8 with ANC of 0.9.  Defer cycle 4 of carbo and Taxol today.  Repeat CBC with differential on Monday.  If ANC improves, we will proceed with cycle 4.  Otherwise may consider short acting filgrastim.  Patient will continue with radiation.  # Access - port placed on 05/28/2022   Orders Placed This Encounter   Procedures   CBC with Differential    Standing Status:   Future    Standing Expiration Date:   07/06/2023   Comprehensive metabolic panel    Standing Status:   Future    Standing Expiration Date:   07/06/2023   CBC with Differential    Standing Status:   Future    Standing Expiration Date:   07/02/2023   RTC on Monday for labs and cycle 4 of CarboTaxol RTC in 1 week for MD visit, labs, cycle 5  The total time spent in the appointment was 30 minutes encounter with patients including review of chart and various tests results, discussions about plan of care and coordination of care plan   All questions were answered. The patient knows to call the clinic with any problems, questions or concerns. No barriers to learning was detected.  [name], MD 11/10/20238:58 PM   HISTORY OF PRESENTING ILLNESS:  BARAA TUBBS 71 y.o. 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  Patient was seen today accompanied by her Sister [name].  Denies any cough, hemoptysis, shortness of breath and weight loss. Remote smoker.  Quit in 2013. Has family history of lung cancer in father.  INTERVAL HISTORY-  Patient seen today prior to cycle 4 of carbo and Taxol. Patient denies fever, chills, nausea, vomiting, shortness of breath, cough, abdominal pain, bleeding, bowel or bladder issues. Energy level is good.  Appetite is good.  Denies any weight loss.  Denies pain.   I have reviewed his chart and materials related to his cancer extensively and collaborated history with the patient. Summary of oncologic history is as follows: Oncology History Overview Note  History of left upper lobe lung SCC status post microwave thermal ablation in 2013 by Dr. Kathlene Cote.  He was deemed not a surgical candidate and had  transportation issues for radiation treatment.   Squamous cell lung cancer ([place])  01/12/2022 Initial Diagnosis   Patient has been following with Dr. Humphrey Rolls of pulmonary for suspicious lung nodule. CT chest in March 2021 showed post ablation scarring in LUL not changed and increased atelectasis of RML and RUL. He did not follow through.   CT chest in 01/12/2022 showed  New abrupt and slightly irregular cut off of the right upper lobe bronchus with associated right upper lobe atelectasis and postobstructive changes. Findings are concerning for endobronchial malignancy    04/08/2022 PET scan   IMPRESSION: 1. Hypermetabolic central right upper lobe lung mass nearly occluding the right upper lobe bronchus, poorly delineated on the noncontrast CT images, measuring approximately 3.8 x 2.9 cm, compatible with primary bronchogenic malignancy. 2. Postobstructive pneumonia/atelectasis throughout the basilar right upper lobe. 3. No hypermetabolic metastatic thoracic adenopathy or distant metastatic disease. 4. Stable mild post ablation change in the left upper lobe with no metabolic evidence of local tumor recurrence in this location. 5. Dilated 4.4 cm ascending thoracic aorta. Recommend annual imaging followup by CTA or MRA   05/12/2022 Procedure   Procedure was delayed due to miscommunication about holding Plavix. S/p bronchoscopy with EBUS by Dr. Patsey Berthold. Op note mentions significant bulky adenopathy in the subcarinal space and in the right hilum.    05/12/2022 Pathology Results   DIAGNOSIS:  A.  LUNG, RIGHT UPPER LOBE; ENB-ASSISTED BIOPSY:  - SQUAMOUS CELL CARCINOMA.   LUNG, RIGHT UPPER LOBE; EBUS-ASSISTED BRUSHING:  - POSITIVE FOR MALIGNANCY.  - SQUAMOUS CELL CARCINOMA IS PRESENT.   LUNG, RIGHT UPPER LOBE; EBUS-ASSISTED LAVAGE:  - POSITIVE FOR MALIGNANCY.  - SQUAMOUS CELL CARCINOMA IS PRESENT  SUBCARINAL SPACE, RIGHT; EBUS-ASSISTED FNA:  - POSITIVE FOR MALIGNANCY.  - SQUAMOUS CELL  CARCINOMA IS PRESENT   05/18/2022 Cancer Staging   Staging form: Lung, AJCC 7th Edition - Clinical: Stage IIIA (T4, N0, M0) - Signed by [name], MD on 06/03/2022   05/21/2022 Imaging   MRI brain  No evidence of metastatic disease in the brain. 2. Small enhancing foci in the right parietal and right occipital calvarium, which are indeterminate but could represent small osseous metastases. Attention on follow-up   06/03/2022 -  Chemotherapy   Patient is on Treatment Plan : LUNG Carboplatin + Paclitaxel + XRT q7d       MEDICAL HISTORY:  Past Medical History:  Diagnosis Date   Alcoholism ([place])    COPD (chronic obstructive pulmonary disease) (HCC)    SEVERE COPD -OCCASIONALLY USES OXYGEN AT NIGHT-DOES NOT USE INHALERS ON REGULAR BASIS   Elevated cholesterol    GERD (gastroesophageal reflux disease)    HOH (hard of hearing)    Hypertension    Lung cancer ([place])    LEFT UPPER LUNG CARCINOMA-NOT A CANDIDATE FOR SURGICAL RESECTION BECAUSE OF HIS COPD AND POOR CANDIDATE FOR RADIATION BECAUSE OF TRANSPORTATION PROBLEMS   Seizures ([place])    CHRONIC DILATIN - PT STATES NO SEIZURES IN PAST COUPLE OF YEARS; PT STATES HIS SEIZURES CAUSED NUMBNESS OF ARMS AND HANDS AND NOT ABLE TO MOVE HIS ARMS   Stroke ([place])  2 TO 3 YRS AGO-AFFECTED HIS SPEECH--ABLE TO AMBULATE WITHOUT ASSIST AND DOES YARD WORK.  DECREASED HEARING IN BOTH EARS SINCE STROKE.   Vitamin D deficiency     SURGICAL HISTORY: Past Surgical History:  Procedure Laterality Date   CAROTID SURGERY 2009 -LEFT     COLON POLYP EXCISION     COLON SURGERY     COLONOSCOPY WITH PROPOFOL N/A 04/18/2017   Procedure: COLONOSCOPY WITH PROPOFOL;  Surgeon: Manya Silvas, MD;  Location: [facility] ENDOSCOPY;  Service: Endoscopy;  Laterality: N/A;   ESOPHAGOGASTRODUODENOSCOPY     IR GENERIC HISTORICAL  01/21/2016   IR RADIOLOGIST EVAL & MGMT 01/21/2016 Aletta Edouard, MD [facility] INTERV RAD   IR GENERIC HISTORICAL  10/22/2014   IR RADIOLOGIST EVAL  & MGMT 10/22/2014 Aletta Edouard, MD [facility] INTERV RAD   IR IMAGING GUIDED PORT INSERTION  05/28/2022   IR RADIOLOGIST EVAL & MGMT  02/02/2017   IR RADIOLOGIST EVAL & MGMT  03/08/2018   percutaneous biopsy     VIDEO BRONCHOSCOPY WITH ENDOBRONCHIAL ULTRASOUND Right 05/12/2022   Procedure: VIDEO BRONCHOSCOPY WITH ENDOBRONCHIAL ULTRASOUND;  Surgeon: Tyler Pita, MD;  Location: [facility] ORS;  Service: Cardiopulmonary;  Laterality: Right;    SOCIAL HISTORY: Social History   Socioeconomic History   Marital status: Single    Spouse name: Not on file   Number of children: Not on file   Years of education: Not on file   Highest education level: Not on file  Occupational History   Not on file  Tobacco Use   Smoking status: Former    Packs/day: 1.00    Years: 35.00    Total pack years: 35.00    Types: Cigarettes    Quit date: 12/22/2011    Years since quitting: 10.5   Smokeless tobacco: Never  Vaping Use   Vaping Use: Never used  Substance and Sexual Activity   Alcohol use: Yes    Comment: occosionally    Drug use: No   Sexual activity: Not on file  Other Topics Concern   Not on file  Social History Narrative   Not on file   Social Determinants of Health   Financial Resource Strain: Low Risk  (11/28/2020)   Overall Financial Resource Strain (CARDIA)    Difficulty of Paying Living Expenses: Not hard at all  Food Insecurity: No Food Insecurity (06/15/2022)   Hunger Vital Sign    Worried About Running Out of Food in the Last Year: Never true    [place] in the Last Year: Never true  Transportation Needs: No Transportation Needs (06/15/2022)   PRAPARE - Hydrologist (Medical): No    Lack of Transportation (Non-Medical): No  Physical Activity: Inactive (06/15/2022)   Exercise Vital Sign    Days of Exercise per Week: 0 days    Minutes of Exercise per Session: 0 min  Stress: Stress Concern Present (06/15/2022)   [place]    Feeling of Stress : To some extent  Social Connections: Socially Isolated (06/15/2022)   Social Connection and Isolation Panel [NHANES]    Frequency of Communication with Friends and Family: More than three times a week    Frequency of Social Gatherings with Friends and Family: More than three times a week    Attends Religious Services: Never    Marine scientist or Organizations: No    Attends Archivist Meetings: Never  Marital Status: Never married  Intimate Partner Violence: Unknown (06/15/2022)   Humiliation, Afraid, Rape, and Kick questionnaire    Fear of Current or Ex-Partner: No    Emotionally Abused: Not on file    Physically Abused: No    Sexually Abused: No    FAMILY HISTORY: Family History  Problem Relation Age of Onset   Colon polyps Sister     ALLERGIES:  has No Known Allergies.  MEDICATIONS:  Current Outpatient Medications  Medication Sig Dispense Refill   aspirin 81 MG tablet Take 81 mg by mouth daily.     atorvastatin (LIPITOR) 80 MG tablet Take 1 tablet (80 mg total) by mouth daily. (Patient taking differently: Take 80 mg by mouth at bedtime.) 90 tablet 3   budesonide-formoterol (SYMBICORT) 160-4.5 MCG/ACT inhaler Inhale 1 puff into the lungs 2 (two) times daily. 1 each 5   Cholecalciferol (VITAMIN D) 2000 UNITS CAPS Take by mouth. VITAMIN D 3 2000 IU  ONE SOFTGEL DAILY  --OTC     clopidogrel (PLAVIX) 75 MG tablet Take 1 tablet (75 mg total) by mouth daily. 90 tablet 3   dexamethasone (DECADRON) 4 MG tablet Take 2 tablets daily for 2 days, start the day after chemotherapy. Take with food. 30 tablet 1   enalapril (VASOTEC) 20 MG tablet Take 1 tablet (20 mg total) by mouth daily. 90 tablet 3   ezetimibe (ZETIA) 10 MG tablet Take 1 tablet (10 mg total) by mouth daily. 90 tablet 3   lidocaine-prilocaine (EMLA) cream Apply to affected area once 30 g 3   ondansetron (ZOFRAN) 8 MG tablet Take 1 tablet (8  mg total) by mouth every 8 (eight) hours as needed for nausea or vomiting. Start on the third day after chemotherapy. 30 tablet 1   OXYGEN Inhale into the lungs. At night     pantoprazole (PROTONIX) 40 MG tablet Take 1 tablet (40 mg total) by mouth daily. IN AM 90 tablet 0   phenytoin (DILANTIN) 100 MG ER capsule Take 4 capsules (400 mg total) by mouth at bedtime. 450 capsule 1   prochlorperazine (COMPAZINE) 10 MG tablet Take 1 tablet (10 mg total) by mouth every 6 (six) hours as needed for nausea or vomiting. 30 tablet 1   No current facility-administered medications for this visit.    REVIEW OF SYSTEMS:   Pertinent information mentioned in HPI All other systems were reviewed with the patient and are negative.  PHYSICAL EXAMINATION: ECOG PERFORMANCE STATUS: 2 - Symptomatic, <50% confined to bed  Vitals:   07/02/22 0951  BP: 103/74  Pulse: 73  Resp: 20  Temp: (!) 95.6 F (35.3 C)  SpO2: 99%   Filed Weights   07/02/22 0951  Weight: 169 lb 14.4 oz (77.1 kg)    GENERAL:alert, no distress and comfortable SKIN: skin color, texture, turgor are normal, no rashes or significant lesions EYES: normal, conjunctiva are pink and non-injected, sclera clear OROPHARYNX:no exudate, no erythema and lips, buccal mucosa, and tongue normal  NECK: supple, thyroid normal size, non-tender, without nodularity LYMPH:  no palpable lymphadenopathy in the cervical, axillary or inguinal LUNGS: clear to auscultation and percussion with normal breathing effort HEART: regular rate & rhythm and no murmurs and no lower extremity edema ABDOMEN:abdomen soft, non-tender and normal bowel sounds Musculoskeletal:no cyanosis of digits and no clubbing  PSYCH: alert & oriented x 3 with fluent speech NEURO: no focal motor/sensory deficits  LABORATORY DATA:  I have reviewed the data as listed Lab Results  Component Value  Date   WBC 1.8 (L) 07/02/2022   HGB 11.5 (L) 07/02/2022   HCT 34.1 (L) 07/02/2022   MCV 90.0  07/02/2022   PLT 371 07/02/2022   Recent Labs    06/17/22 0951 06/24/22 0818 07/02/22 0835  NA 136 135 135  K 3.8 4.0 4.1  CL 102 102 101  CO2 27 28 26   GLUCOSE 111* 99 109*  BUN 6* 8 9  CREATININE 0.49* 0.37* 0.56*  CALCIUM 9.2 9.2 9.3  GFRNONAA >60 >60 >60  PROT 7.6 7.4 7.6  ALBUMIN 3.6 3.5 3.6  AST 16 13* 19  ALT 15 12 14   ALKPHOS 108 109 107  BILITOT 0.4 0.5 0.3    RADIOGRAPHIC STUDIES: I have personally reviewed the radiological images as listed and agreed with the findings in the report. No results found.

## 2022-07-02 NOTE — Progress Notes (Addendum)
[place] CSW Progress Note  Clinical Education officer, museum contacted caregiver by phone to discuss rescheduling Advance Directive clinic.  CSW informed patient's sister/main caregiver Derek Webb, Derek Webb (Sister)[phone] of next scheduled AD clinic.  Derek Webb, Derek Webb stated patient has an appointment at 10:15 that day, CSW offered the 9:00 AM or 9:30 AM AD appointment.  Derek Webb stated she wasn't sure "what we are expected to do while we wait [for the 10:15] appointment." CSW offered to schedule Derek Webb in the 9:30 AD appointment, and they could wait in the lobby until the 10:15 appointment. CSW recommended Derek Webb contact her bank and ask about notary services.  CSW offered to contact Derek Webb later this month, with the December AD Clinic dates, if the upcoming 07/08/2022 AD Clinic date, was not convinient for her schedule. Derek Webb stated she would contact CSW with a confirmation on scheduling the appointment.        Adelene Amas, LCSW

## 2022-07-05 ENCOUNTER — Ambulatory Visit: Admission: RE | Admit: 2022-07-05 | Payer: Medicare Other | Source: Ambulatory Visit | Admitting: Gastroenterology

## 2022-07-05 ENCOUNTER — Ambulatory Visit
Admission: RE | Admit: 2022-07-05 | Discharge: 2022-07-05 | Disposition: A | Discharge status: Home / Self Care | Payer: Medicare Other | Source: Ambulatory Visit | Attending: Radiation Oncology | Admitting: Radiation Oncology

## 2022-07-05 ENCOUNTER — Telehealth: Payer: Self-pay

## 2022-07-05 ENCOUNTER — Inpatient Hospital Stay: Payer: Medicare Other

## 2022-07-05 ENCOUNTER — Inpatient Hospital Stay (HOSPITAL_BASED_OUTPATIENT_CLINIC_OR_DEPARTMENT_OTHER): Payer: Medicare Other | Admitting: Internal Medicine

## 2022-07-05 ENCOUNTER — Ambulatory Visit: Payer: Medicare Other

## 2022-07-05 ENCOUNTER — Other Ambulatory Visit: Payer: Self-pay

## 2022-07-05 ENCOUNTER — Encounter: Admission: RE | Payer: Self-pay | Source: Ambulatory Visit

## 2022-07-05 ENCOUNTER — Encounter: Payer: Self-pay | Admitting: Internal Medicine

## 2022-07-05 VITALS — BP 95/82 | HR 63 | Temp 98.0°F | Resp 17

## 2022-07-05 DIAGNOSIS — D701 Agranulocytosis secondary to cancer chemotherapy: Secondary | ICD-10-CM | POA: Insufficient documentation

## 2022-07-05 DIAGNOSIS — D702 Other drug-induced agranulocytosis: Secondary | ICD-10-CM | POA: Diagnosis not present

## 2022-07-05 DIAGNOSIS — C3411 Malignant neoplasm of upper lobe, right bronchus or lung: Secondary | ICD-10-CM | POA: Diagnosis not present

## 2022-07-05 DIAGNOSIS — Z452 Encounter for adjustment and management of vascular access device: Secondary | ICD-10-CM | POA: Diagnosis not present

## 2022-07-05 DIAGNOSIS — T451X5A Adverse effect of antineoplastic and immunosuppressive drugs, initial encounter: Secondary | ICD-10-CM | POA: Diagnosis not present

## 2022-07-05 DIAGNOSIS — D6481 Anemia due to antineoplastic chemotherapy: Secondary | ICD-10-CM | POA: Diagnosis not present

## 2022-07-05 DIAGNOSIS — C3491 Malignant neoplasm of unspecified part of right bronchus or lung: Secondary | ICD-10-CM

## 2022-07-05 DIAGNOSIS — J449 Chronic obstructive pulmonary disease, unspecified: Secondary | ICD-10-CM | POA: Diagnosis not present

## 2022-07-05 DIAGNOSIS — Z5111 Encounter for antineoplastic chemotherapy: Secondary | ICD-10-CM

## 2022-07-05 DIAGNOSIS — Z510 Encounter for antineoplastic radiation therapy: Secondary | ICD-10-CM | POA: Diagnosis not present

## 2022-07-05 DIAGNOSIS — I10 Essential (primary) hypertension: Secondary | ICD-10-CM | POA: Diagnosis not present

## 2022-07-05 DIAGNOSIS — Z87891 Personal history of nicotine dependence: Secondary | ICD-10-CM | POA: Diagnosis not present

## 2022-07-05 LAB — COMPREHENSIVE METABOLIC PANEL
ALT: 16 U/L (ref 0–44)
AST: 18 U/L (ref 15–41)
Albumin: 3.6 g/dL (ref 3.5–5.0)
Alkaline Phosphatase: 90 U/L (ref 38–126)
Anion gap: 6 (ref 5–15)
BUN: 9 mg/dL (ref 8–23)
CO2: 28 mmol/L (ref 22–32)
Calcium: 9 mg/dL (ref 8.9–10.3)
Chloride: 103 mmol/L (ref 98–111)
Creatinine, Ser: 0.43 mg/dL — ABNORMAL LOW (ref 0.61–1.24)
GFR, Estimated: >60 mL/min (ref >60)
Glucose, Bld: 97 mg/dL (ref 70–99)
Potassium: 3.8 mmol/L (ref 3.5–5.1)
Sodium: 137 mmol/L (ref 135–145)
Total Bilirubin: 0.3 mg/dL (ref 0.3–1.2)
Total Protein: 7.3 g/dL (ref 6.5–8.1)

## 2022-07-05 LAB — CBC WITH DIFFERENTIAL/PLATELET
Abs Immature Granulocytes: 0.01 10*3/uL (ref 0.00–0.07)
Basophils Absolute: 0 10*3/uL (ref 0.0–0.1)
Basophils Relative: 2 %
Eosinophils Absolute: 0 10*3/uL (ref 0.0–0.5)
Eosinophils Relative: 2 %
HCT: 34 % — ABNORMAL LOW (ref 39.0–52.0)
Hemoglobin: 11.5 g/dL — ABNORMAL LOW (ref 13.0–17.0)
Immature Granulocytes: 1 %
Lymphocytes Relative: 33 %
Lymphs Abs: 0.6 10*3/uL — ABNORMAL LOW (ref 0.7–4.0)
MCH: 30.7 pg (ref 26.0–34.0)
MCHC: 33.8 g/dL (ref 30.0–36.0)
MCV: 90.7 fL (ref 80.0–100.0)
Monocytes Absolute: 0.4 10*3/uL (ref 0.1–1.0)
Monocytes Relative: 22 %
Neutro Abs: 0.7 10*3/uL — ABNORMAL LOW (ref 1.7–7.7)
Neutrophils Relative %: 40 %
Platelets: 331 10*3/uL (ref 150–400)
RBC: 3.75 MIL/uL — ABNORMAL LOW (ref 4.22–5.81)
RDW: 14.4 % (ref 11.5–15.5)
WBC: 1.8 10*3/uL — ABNORMAL LOW (ref 4.0–10.5)
nRBC: 0 % (ref 0.0–0.2)

## 2022-07-05 LAB — RAD ONC ARIA SESSION SUMMARY
Course Elapsed Days: 32
Plan Fractions Treated to Date: 22
Plan Prescribed Dose Per Fraction: 2 Gy
Plan Total Fractions Prescribed: 35
Plan Total Prescribed Dose: 70 Gy
Reference Point Dosage Given to Date: 44 Gy
Reference Point Session Dosage Given: 2 Gy
Session Number: 22

## 2022-07-05 SURGERY — COLONOSCOPY WITH PROPOFOL
Anesthesia: General

## 2022-07-05 MED ORDER — HEPARIN SOD (PORK) LOCK FLUSH 100 UNIT/ML IV SOLN
500.0000 [IU] | Freq: Once | INTRAVENOUS | Status: AC
  Administered 2022-07-05: 500 [IU] via INTRAVENOUS
  Filled 2022-07-05: qty 5

## 2022-07-05 MED ORDER — SODIUM CHLORIDE 0.9% FLUSH
10.0000 mL | Freq: Once | INTRAVENOUS | Status: AC
  Administered 2022-07-05: 10 mL via INTRAVENOUS
  Filled 2022-07-05: qty 10

## 2022-07-05 NOTE — Telephone Encounter (Signed)
See previous note

## 2022-07-05 NOTE — Telephone Encounter (Signed)
Spoke to sister regarding new zarxio injection apt. Sister verbalizes understanding. All questions answered.

## 2022-07-05 NOTE — Progress Notes (Signed)
[name] CONSULT NOTE  Patient Care Team: [name], Ricki Rodriguez as PCP - General (Physician Assistant) Alena Bills, [facility] as Pharmacist (Pharmacist) Telford Nab, RN as Oncology Nurse Navigator  REFERRING PROVIDER: Dr. Patsey Berthold  REASON FOR REFFERAL: newly diagnosed SCCa of right lung  CANCER STAGING   Cancer Staging  Squamous cell lung cancer [facility]) Staging form: Lung, AJCC 7th Edition - Clinical: Stage IIIA (T4, N0, M0) - Signed by Derek Canary, MD on 06/03/2022  CURRENT TREATMENT- Weekly Carboplatin and Taxol with RT started 06/03/2022.   ASSESSMENT & PLAN:  Derek Webb 71 y.o. 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  #RUL of lung SCCa, atleast Stage IIIA (cT4N0M0) #Encounter for antineoplastic chemotherapy -PET CT scan was personally reviewed.  There is a large 4 cm mass in the right upper lobe partially obstructing the right upper lobe bronchus, involving the hilum and subcarinal space with postobstructive pneumonia. No lymphadenopathy.   -s/p bronchoscopy with EBUS by Dr. Patsey Berthold on 05/12/2022. -MRI brain on 05/22/2022 personally reviewed.  No evidence of metastatic disease in the brain. Small enhancing foci in the right parietal and right occipital calvarium, which are indeterminate but could represent small osseous metastases. Attention on follow-up. - not a surgical candidate  -Labs reviewed.  CMP unremarkable.  Repeat ANC even lower to 0.7.  Continue to hold cycle 4 of CarboTaxol.  I spoke with authorization staff and got approval for Zarxio.  Patient will comeon 11/14 and 11/15 for Zarxio 480 mics.  Repeat labs in 2 days.  Once ANC recovers more than 1000, will consider dose reduced carboplatin AUC 1.5 and Taxol 35 mg/m2. Patient will continue with  radiation.  #Chemotherapy induced neutropenia Plan as above  #Mild anemia  -Likely related to chemotherapy.  We will continue to monitor.  # Access - port placed on 05/28/2022   No orders of the defined types were placed in this encounter.  RTC on 11/14 and 11/15 for Zarxio injections, labs  RTC on Monday 11/30 for c4. Sooner if [place] recovers  The total time spent in the appointment was 30 minutes encounter with patients including review of chart and various tests results, discussions about plan of care and coordination of care plan   All questions were answered. The patient knows to call the clinic with any problems, questions or concerns. No barriers to learning was detected.  Derek Canary, MD 11/13/20236:38 PM   HISTORY OF PRESENTING ILLNESS:  Derek Webb 71 y.o. 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  Patient was seen today accompanied by her Sister Derek Webb.  Denies any cough, hemoptysis, shortness of breath and weight loss. Remote smoker.  Quit in 2013. Has family history of lung cancer in father.  INTERVAL HISTORY-  Patient seen today prior to cycle 4 of carbo and Taxol. Patient denies fever, chills, nausea, vomiting, shortness of breath, cough, abdominal pain, bleeding, bowel or bladder issues. Energy level is good.  Appetite is good.  Denies any weight loss. Denies pain.   I have reviewed his chart and materials related to his cancer extensively and collaborated history with the patient. Summary of oncologic  history is as follows: Oncology History Overview Note  History of left upper lobe lung SCC status post microwave thermal ablation in 2013 by Dr. Kathlene Webb.  He was deemed not a surgical candidate and had transportation issues for radiation treatment.    Squamous cell lung cancer ([place])  01/12/2022 Initial Diagnosis   Patient has been following with Dr. Humphrey Webb of pulmonary for suspicious lung nodule. CT chest in March 2021 showed post ablation scarring in LUL not changed and increased atelectasis of RML and RUL. He did not follow through.   CT chest in 01/12/2022 showed  New abrupt and slightly irregular cut off of the right upper lobe bronchus with associated right upper lobe atelectasis and postobstructive changes. Findings are concerning for endobronchial malignancy    04/08/2022 PET scan   IMPRESSION: 1. Hypermetabolic central right upper lobe lung mass nearly occluding the right upper lobe bronchus, poorly delineated on the noncontrast CT images, measuring approximately 3.8 x 2.9 cm, compatible with primary bronchogenic malignancy. 2. Postobstructive pneumonia/atelectasis throughout the basilar right upper lobe. 3. No hypermetabolic metastatic thoracic adenopathy or distant metastatic disease. 4. Stable mild post ablation change in the left upper lobe with no metabolic evidence of local tumor recurrence in this location. 5. Dilated 4.4 cm ascending thoracic aorta. Recommend annual imaging followup by CTA or MRA   05/12/2022 Procedure   Procedure was delayed due to miscommunication about holding Plavix. S/p bronchoscopy with EBUS by Dr. Patsey Berthold. Op note mentions significant bulky adenopathy in the subcarinal space and in the right hilum.    05/12/2022 Pathology Results   DIAGNOSIS:  A.  LUNG, RIGHT UPPER LOBE; ENB-ASSISTED BIOPSY:  - SQUAMOUS CELL CARCINOMA.   LUNG, RIGHT UPPER LOBE; EBUS-ASSISTED BRUSHING:  - POSITIVE FOR MALIGNANCY.  - SQUAMOUS CELL CARCINOMA IS PRESENT.   LUNG, RIGHT UPPER LOBE; EBUS-ASSISTED LAVAGE:  - POSITIVE FOR MALIGNANCY.  - SQUAMOUS CELL CARCINOMA IS PRESENT  SUBCARINAL SPACE, RIGHT; EBUS-ASSISTED FNA:  - POSITIVE FOR MALIGNANCY.  - SQUAMOUS CELL CARCINOMA IS PRESENT   05/18/2022 Cancer Staging    Staging form: Lung, AJCC 7th Edition - Clinical: Stage IIIA (T4, N0, M0) - Signed by Derek Canary, MD on 06/03/2022   05/21/2022 Imaging   MRI brain  No evidence of metastatic disease in the brain. 2. Small enhancing foci in the right parietal and right occipital calvarium, which are indeterminate but could represent small osseous metastases. Attention on follow-up   06/03/2022 -  Chemotherapy   Patient is on Treatment Plan : LUNG Carboplatin + Paclitaxel + XRT q7d       MEDICAL HISTORY:  Past Medical History:  Diagnosis Date   Alcoholism ([place])    COPD (chronic obstructive pulmonary disease) (HCC)    SEVERE COPD -OCCASIONALLY USES OXYGEN AT NIGHT-DOES NOT USE INHALERS ON REGULAR BASIS   Elevated cholesterol    GERD (gastroesophageal reflux disease)    HOH (hard of hearing)    Hypertension    Lung cancer ([place])    LEFT UPPER LUNG CARCINOMA-NOT A CANDIDATE FOR SURGICAL RESECTION BECAUSE OF HIS COPD AND POOR CANDIDATE FOR RADIATION BECAUSE OF TRANSPORTATION PROBLEMS   Seizures ([place])    CHRONIC DILATIN - PT STATES NO SEIZURES IN PAST COUPLE OF YEARS; PT STATES HIS SEIZURES CAUSED NUMBNESS OF ARMS AND HANDS AND NOT ABLE TO MOVE HIS ARMS   Stroke ([place])    2 TO 3 YRS AGO-AFFECTED HIS SPEECH--ABLE TO AMBULATE WITHOUT ASSIST AND DOES YARD WORK.  DECREASED HEARING IN BOTH EARS SINCE STROKE.  Vitamin D deficiency     SURGICAL HISTORY: Past Surgical History:  Procedure Laterality Date   CAROTID SURGERY 2009 -LEFT     COLON POLYP EXCISION     COLON SURGERY     COLONOSCOPY WITH PROPOFOL N/A 04/18/2017   Procedure: COLONOSCOPY WITH PROPOFOL;  Surgeon: Manya Silvas, MD;  Location: [facility] ENDOSCOPY;  Service: Endoscopy;  Laterality: N/A;   ESOPHAGOGASTRODUODENOSCOPY     IR GENERIC HISTORICAL  01/21/2016   IR RADIOLOGIST EVAL & MGMT 01/21/2016 Aletta Edouard, MD [facility] INTERV RAD   IR GENERIC HISTORICAL  10/22/2014   IR RADIOLOGIST EVAL & MGMT 10/22/2014 Aletta Edouard, MD [facility] INTERV  RAD   IR IMAGING GUIDED PORT INSERTION  05/28/2022   IR RADIOLOGIST EVAL & MGMT  02/02/2017   IR RADIOLOGIST EVAL & MGMT  03/08/2018   percutaneous biopsy     VIDEO BRONCHOSCOPY WITH ENDOBRONCHIAL ULTRASOUND Right 05/12/2022   Procedure: VIDEO BRONCHOSCOPY WITH ENDOBRONCHIAL ULTRASOUND;  Surgeon: Tyler Pita, MD;  Location: [facility] ORS;  Service: Cardiopulmonary;  Laterality: Right;    SOCIAL HISTORY: Social History   Socioeconomic History   Marital status: Single    Spouse name: Not on file   Number of children: Not on file   Years of education: Not on file   Highest education level: Not on file  Occupational History   Not on file  Tobacco Use   Smoking status: Former    Packs/day: 1.00    Years: 35.00    Total pack years: 35.00    Types: Cigarettes    Quit date: 12/22/2011    Years since quitting: 10.5   Smokeless tobacco: Never  Vaping Use   Vaping Use: Never used  Substance and Sexual Activity   Alcohol use: Yes    Comment: occosionally    Drug use: No   Sexual activity: Not on file  Other Topics Concern   Not on file  Social History Narrative   Not on file   Social Determinants of Health   Financial Resource Strain: Low Risk  (11/28/2020)   Overall Financial Resource Strain (CARDIA)    Difficulty of Paying Living Expenses: Not hard at all  Food Insecurity: No Food Insecurity (06/15/2022)   Hunger Vital Sign    Worried About Running Out of Food in the Last Year: Never true    [place] in the Last Year: Never true  Transportation Needs: No Transportation Needs (06/15/2022)   PRAPARE - Hydrologist (Medical): No    Lack of Transportation (Non-Medical): No  Physical Activity: Inactive (06/15/2022)   Exercise Vital Sign    Days of Exercise per Week: 0 days    Minutes of Exercise per Session: 0 min  Stress: Stress Concern Present (06/15/2022)   [place]     Feeling of Stress : To some extent  Social Connections: Socially Isolated (06/15/2022)   Social Connection and Isolation Panel [NHANES]    Frequency of Communication with Friends and Family: More than three times a week    Frequency of Social Gatherings with Friends and Family: More than three times a week    Attends Religious Services: Never    Marine scientist or Organizations: No    Attends Archivist Meetings: Never    Marital Status: Never married  Intimate Partner Violence: Unknown (06/15/2022)   Humiliation, Afraid, Rape, and Kick questionnaire    Fear of Current or  Ex-Partner: No    Emotionally Abused: Not on file    Physically Abused: No    Sexually Abused: No    FAMILY HISTORY: Family History  Problem Relation Age of Onset   Colon polyps Sister     ALLERGIES:  has No Known Allergies.  MEDICATIONS:  Current Outpatient Medications  Medication Sig Dispense Refill   aspirin 81 MG tablet Take 81 mg by mouth daily.     atorvastatin (LIPITOR) 80 MG tablet Take 1 tablet (80 mg total) by mouth daily. 90 tablet 3   budesonide-formoterol (SYMBICORT) 160-4.5 MCG/ACT inhaler Inhale 1 puff into the lungs 2 (two) times daily. 1 each 5   Cholecalciferol (VITAMIN D) 2000 UNITS CAPS Take by mouth. VITAMIN D 3 2000 IU  ONE SOFTGEL DAILY  --OTC     clopidogrel (PLAVIX) 75 MG tablet Take 1 tablet (75 mg total) by mouth daily. 90 tablet 3   dexamethasone (DECADRON) 4 MG tablet Take 2 tablets daily for 2 days, start the day after chemotherapy. Take with food. 30 tablet 1   enalapril (VASOTEC) 20 MG tablet Take 1 tablet (20 mg total) by mouth daily. 90 tablet 3   ezetimibe (ZETIA) 10 MG tablet Take 1 tablet (10 mg total) by mouth daily. 90 tablet 3   lidocaine-prilocaine (EMLA) cream Apply to affected area once 30 g 3   ondansetron (ZOFRAN) 8 MG tablet Take 1 tablet (8 mg total) by mouth every 8 (eight) hours as needed for nausea or vomiting. Start on the third day after  chemotherapy. 30 tablet 1   OXYGEN Inhale into the lungs. At night     pantoprazole (PROTONIX) 40 MG tablet Take 1 tablet (40 mg total) by mouth daily. IN AM 90 tablet 0   phenytoin (DILANTIN) 100 MG ER capsule Take 4 capsules (400 mg total) by mouth at bedtime. 450 capsule 1   prochlorperazine (COMPAZINE) 10 MG tablet Take 1 tablet (10 mg total) by mouth every 6 (six) hours as needed for nausea or vomiting. 30 tablet 1   No current facility-administered medications for this visit.    REVIEW OF SYSTEMS:   Pertinent information mentioned in HPI All other systems were reviewed with the patient and are negative.  PHYSICAL EXAMINATION: ECOG PERFORMANCE STATUS: 2 - Symptomatic, <50% confined to bed  Vitals:   07/05/22 0911  BP: 95/82  Pulse: 63  Resp: 17  Temp: 98 F (36.7 C)  SpO2: 95%   There were no vitals filed for this visit.   GENERAL:alert, no distress and comfortable SKIN: skin color, texture, turgor are normal, no rashes or significant lesions EYES: normal, conjunctiva are pink and non-injected, sclera clear OROPHARYNX:no exudate, no erythema and lips, buccal mucosa, and tongue normal  NECK: supple, thyroid normal size, non-tender, without nodularity LYMPH:  no palpable lymphadenopathy in the cervical, axillary or inguinal LUNGS: clear to auscultation and percussion with normal breathing effort HEART: regular rate & rhythm and no murmurs and no lower extremity edema ABDOMEN:abdomen soft, non-tender and normal bowel sounds Musculoskeletal:no cyanosis of digits and no clubbing  PSYCH: alert & oriented x 3 with fluent speech NEURO: no focal motor/sensory deficits  LABORATORY DATA:  I have reviewed the data as listed Lab Results  Component Value Date   WBC 1.8 (L) 07/05/2022   HGB 11.5 (L) 07/05/2022   HCT 34.0 (L) 07/05/2022   MCV 90.7 07/05/2022   PLT 331 07/05/2022   Recent Labs    06/24/22 0818 07/02/22 0835 07/05/22 0820  NA 135 135 137  K 4.0 4.1 3.8   CL 102 101 103  CO2 28 26 28   GLUCOSE 99 109* 97  BUN 8 9 9   CREATININE 0.37* 0.56* 0.43*  CALCIUM 9.2 9.3 9.0  GFRNONAA >60 >60 >60  PROT 7.4 7.6 7.3  ALBUMIN 3.5 3.6 3.6  AST 13* 19 18  ALT 12 14 16   ALKPHOS 109 107 90  BILITOT 0.5 0.3 0.3    RADIOGRAPHIC STUDIES: I have personally reviewed the radiological images as listed and agreed with the findings in the report. No results found.

## 2022-07-05 NOTE — Progress Notes (Signed)
ANC 0.7 today.  No treatment today per Dr. Darrall Dears.  Pt left infusion suite stable and ambulatory

## 2022-07-06 ENCOUNTER — Ambulatory Visit: Payer: Medicare Other

## 2022-07-06 ENCOUNTER — Other Ambulatory Visit: Payer: Self-pay

## 2022-07-06 ENCOUNTER — Ambulatory Visit
Admission: RE | Admit: 2022-07-06 | Discharge: 2022-07-06 | Disposition: A | Discharge status: Home / Self Care | Payer: Medicare Other | Source: Ambulatory Visit | Attending: Radiation Oncology | Admitting: Radiation Oncology

## 2022-07-06 ENCOUNTER — Inpatient Hospital Stay: Payer: Medicare Other

## 2022-07-06 DIAGNOSIS — C3490 Malignant neoplasm of unspecified part of unspecified bronchus or lung: Secondary | ICD-10-CM

## 2022-07-06 DIAGNOSIS — Z5111 Encounter for antineoplastic chemotherapy: Secondary | ICD-10-CM | POA: Diagnosis not present

## 2022-07-06 DIAGNOSIS — C3411 Malignant neoplasm of upper lobe, right bronchus or lung: Secondary | ICD-10-CM | POA: Diagnosis not present

## 2022-07-06 DIAGNOSIS — Z452 Encounter for adjustment and management of vascular access device: Secondary | ICD-10-CM | POA: Diagnosis not present

## 2022-07-06 DIAGNOSIS — I10 Essential (primary) hypertension: Secondary | ICD-10-CM | POA: Diagnosis not present

## 2022-07-06 DIAGNOSIS — D701 Agranulocytosis secondary to cancer chemotherapy: Secondary | ICD-10-CM | POA: Diagnosis not present

## 2022-07-06 DIAGNOSIS — Z87891 Personal history of nicotine dependence: Secondary | ICD-10-CM | POA: Diagnosis not present

## 2022-07-06 DIAGNOSIS — Z510 Encounter for antineoplastic radiation therapy: Secondary | ICD-10-CM | POA: Diagnosis not present

## 2022-07-06 DIAGNOSIS — J449 Chronic obstructive pulmonary disease, unspecified: Secondary | ICD-10-CM | POA: Diagnosis not present

## 2022-07-06 LAB — RAD ONC ARIA SESSION SUMMARY
Course Elapsed Days: 33
Plan Fractions Treated to Date: 23
Plan Prescribed Dose Per Fraction: 2 Gy
Plan Total Fractions Prescribed: 35
Plan Total Prescribed Dose: 70 Gy
Reference Point Dosage Given to Date: 46 Gy
Reference Point Session Dosage Given: 2 Gy
Session Number: 23

## 2022-07-06 MED ORDER — FILGRASTIM-SNDZ 480 MCG/0.8ML IJ SOSY
480.0000 ug | PREFILLED_SYRINGE | Freq: Once | INTRAMUSCULAR | Status: AC
  Administered 2022-07-06: 480 ug via SUBCUTANEOUS
  Filled 2022-07-06: qty 0.8

## 2022-07-07 ENCOUNTER — Ambulatory Visit
Admission: RE | Admit: 2022-07-07 | Discharge: 2022-07-07 | Disposition: A | Discharge status: Home / Self Care | Payer: Medicare Other | Source: Ambulatory Visit | Attending: Radiation Oncology | Admitting: Radiation Oncology

## 2022-07-07 ENCOUNTER — Encounter: Payer: Self-pay | Admitting: Internal Medicine

## 2022-07-07 ENCOUNTER — Inpatient Hospital Stay: Payer: Medicare Other

## 2022-07-07 ENCOUNTER — Other Ambulatory Visit: Payer: Self-pay

## 2022-07-07 ENCOUNTER — Ambulatory Visit: Payer: Medicare Other

## 2022-07-07 DIAGNOSIS — Z452 Encounter for adjustment and management of vascular access device: Secondary | ICD-10-CM | POA: Diagnosis not present

## 2022-07-07 DIAGNOSIS — C3491 Malignant neoplasm of unspecified part of right bronchus or lung: Secondary | ICD-10-CM

## 2022-07-07 DIAGNOSIS — C3490 Malignant neoplasm of unspecified part of unspecified bronchus or lung: Secondary | ICD-10-CM

## 2022-07-07 DIAGNOSIS — Z510 Encounter for antineoplastic radiation therapy: Secondary | ICD-10-CM | POA: Diagnosis not present

## 2022-07-07 DIAGNOSIS — Z5111 Encounter for antineoplastic chemotherapy: Secondary | ICD-10-CM | POA: Diagnosis not present

## 2022-07-07 DIAGNOSIS — C3411 Malignant neoplasm of upper lobe, right bronchus or lung: Secondary | ICD-10-CM | POA: Diagnosis not present

## 2022-07-07 DIAGNOSIS — I10 Essential (primary) hypertension: Secondary | ICD-10-CM | POA: Diagnosis not present

## 2022-07-07 DIAGNOSIS — J449 Chronic obstructive pulmonary disease, unspecified: Secondary | ICD-10-CM | POA: Diagnosis not present

## 2022-07-07 DIAGNOSIS — D701 Agranulocytosis secondary to cancer chemotherapy: Secondary | ICD-10-CM | POA: Diagnosis not present

## 2022-07-07 DIAGNOSIS — Z87891 Personal history of nicotine dependence: Secondary | ICD-10-CM | POA: Diagnosis not present

## 2022-07-07 DIAGNOSIS — T451X5A Adverse effect of antineoplastic and immunosuppressive drugs, initial encounter: Secondary | ICD-10-CM

## 2022-07-07 LAB — RAD ONC ARIA SESSION SUMMARY
Course Elapsed Days: 34
Plan Fractions Treated to Date: 24
Plan Prescribed Dose Per Fraction: 2 Gy
Plan Total Fractions Prescribed: 35
Plan Total Prescribed Dose: 70 Gy
Reference Point Dosage Given to Date: 48 Gy
Reference Point Session Dosage Given: 2 Gy
Session Number: 24

## 2022-07-07 LAB — CBC WITH DIFFERENTIAL/PLATELET
Abs Immature Granulocytes: 0.93 10*3/uL — ABNORMAL HIGH (ref 0.00–0.07)
Basophils Absolute: 0.1 10*3/uL (ref 0.0–0.1)
Basophils Relative: 0 %
Eosinophils Absolute: 0 10*3/uL (ref 0.0–0.5)
Eosinophils Relative: 0 %
HCT: 33.7 % — ABNORMAL LOW (ref 39.0–52.0)
Hemoglobin: 11.4 g/dL — ABNORMAL LOW (ref 13.0–17.0)
Immature Granulocytes: 4 %
Lymphocytes Relative: 5 %
Lymphs Abs: 1 10*3/uL (ref 0.7–4.0)
MCH: 30.8 pg (ref 26.0–34.0)
MCHC: 33.8 g/dL (ref 30.0–36.0)
MCV: 91.1 fL (ref 80.0–100.0)
Monocytes Absolute: 1 10*3/uL (ref 0.1–1.0)
Monocytes Relative: 5 %
Neutro Abs: 18.7 10*3/uL — ABNORMAL HIGH (ref 1.7–7.7)
Neutrophils Relative %: 86 %
Platelets: 291 10*3/uL (ref 150–400)
RBC: 3.7 MIL/uL — ABNORMAL LOW (ref 4.22–5.81)
RDW: 14.6 % (ref 11.5–15.5)
WBC: 21.7 10*3/uL — ABNORMAL HIGH (ref 4.0–10.5)
nRBC: 0 % (ref 0.0–0.2)

## 2022-07-07 LAB — TYPE AND SCREEN
ABO/RH(D): O POS
Antibody Screen: NEGATIVE

## 2022-07-07 NOTE — Addendum Note (Signed)
Addended byJane Canary on: 07/07/2022 02:04 PM   Modules accepted: Orders

## 2022-07-07 NOTE — Addendum Note (Signed)
Addended byJane Canary on: 07/07/2022 01:01 PM   Modules accepted: Orders

## 2022-07-08 ENCOUNTER — Other Ambulatory Visit: Payer: Self-pay

## 2022-07-08 ENCOUNTER — Ambulatory Visit: Payer: Medicare Other

## 2022-07-08 ENCOUNTER — Ambulatory Visit
Admission: RE | Admit: 2022-07-08 | Discharge: 2022-07-08 | Disposition: A | Discharge status: Home / Self Care | Payer: Medicare Other | Source: Ambulatory Visit | Attending: Radiation Oncology | Admitting: Radiation Oncology

## 2022-07-08 DIAGNOSIS — D701 Agranulocytosis secondary to cancer chemotherapy: Secondary | ICD-10-CM | POA: Diagnosis not present

## 2022-07-08 DIAGNOSIS — C3411 Malignant neoplasm of upper lobe, right bronchus or lung: Secondary | ICD-10-CM | POA: Diagnosis not present

## 2022-07-08 DIAGNOSIS — Z510 Encounter for antineoplastic radiation therapy: Secondary | ICD-10-CM | POA: Diagnosis not present

## 2022-07-08 DIAGNOSIS — Z5111 Encounter for antineoplastic chemotherapy: Secondary | ICD-10-CM | POA: Diagnosis not present

## 2022-07-08 DIAGNOSIS — I10 Essential (primary) hypertension: Secondary | ICD-10-CM | POA: Diagnosis not present

## 2022-07-08 DIAGNOSIS — Z87891 Personal history of nicotine dependence: Secondary | ICD-10-CM | POA: Diagnosis not present

## 2022-07-08 DIAGNOSIS — J449 Chronic obstructive pulmonary disease, unspecified: Secondary | ICD-10-CM | POA: Diagnosis not present

## 2022-07-08 DIAGNOSIS — Z452 Encounter for adjustment and management of vascular access device: Secondary | ICD-10-CM | POA: Diagnosis not present

## 2022-07-08 LAB — RAD ONC ARIA SESSION SUMMARY
Course Elapsed Days: 35
Plan Fractions Treated to Date: 25
Plan Prescribed Dose Per Fraction: 2 Gy
Plan Total Fractions Prescribed: 35
Plan Total Prescribed Dose: 70 Gy
Reference Point Dosage Given to Date: 50 Gy
Reference Point Session Dosage Given: 2 Gy
Session Number: 25

## 2022-07-08 MED FILL — Dexamethasone Sodium Phosphate Inj 100 MG/10ML: INTRAMUSCULAR | Qty: 1 | Status: AC

## 2022-07-09 ENCOUNTER — Inpatient Hospital Stay: Payer: Medicare Other

## 2022-07-09 ENCOUNTER — Ambulatory Visit: Payer: Medicare Other

## 2022-07-09 ENCOUNTER — Other Ambulatory Visit: Payer: Medicare Other

## 2022-07-09 ENCOUNTER — Other Ambulatory Visit: Payer: Self-pay

## 2022-07-09 ENCOUNTER — Ambulatory Visit
Admission: RE | Admit: 2022-07-09 | Discharge: 2022-07-09 | Disposition: A | Discharge status: Home / Self Care | Payer: Medicare Other | Source: Ambulatory Visit | Attending: Radiation Oncology | Admitting: Radiation Oncology

## 2022-07-09 ENCOUNTER — Ambulatory Visit: Payer: Medicare Other | Admitting: Internal Medicine

## 2022-07-09 VITALS — BP 121/82 | HR 67 | Temp 99.2°F | Resp 18 | Wt 168.8 lb

## 2022-07-09 DIAGNOSIS — I10 Essential (primary) hypertension: Secondary | ICD-10-CM | POA: Diagnosis not present

## 2022-07-09 DIAGNOSIS — J449 Chronic obstructive pulmonary disease, unspecified: Secondary | ICD-10-CM | POA: Diagnosis not present

## 2022-07-09 DIAGNOSIS — C3411 Malignant neoplasm of upper lobe, right bronchus or lung: Secondary | ICD-10-CM | POA: Diagnosis not present

## 2022-07-09 DIAGNOSIS — Z452 Encounter for adjustment and management of vascular access device: Secondary | ICD-10-CM | POA: Diagnosis not present

## 2022-07-09 DIAGNOSIS — Z5111 Encounter for antineoplastic chemotherapy: Secondary | ICD-10-CM | POA: Diagnosis not present

## 2022-07-09 DIAGNOSIS — C3491 Malignant neoplasm of unspecified part of right bronchus or lung: Secondary | ICD-10-CM

## 2022-07-09 DIAGNOSIS — Z87891 Personal history of nicotine dependence: Secondary | ICD-10-CM | POA: Diagnosis not present

## 2022-07-09 DIAGNOSIS — Z510 Encounter for antineoplastic radiation therapy: Secondary | ICD-10-CM | POA: Diagnosis not present

## 2022-07-09 DIAGNOSIS — D701 Agranulocytosis secondary to cancer chemotherapy: Secondary | ICD-10-CM | POA: Diagnosis not present

## 2022-07-09 LAB — COMPREHENSIVE METABOLIC PANEL
ALT: 15 U/L (ref 0–44)
AST: 15 U/L (ref 15–41)
Albumin: 3.6 g/dL (ref 3.5–5.0)
Alkaline Phosphatase: 104 U/L (ref 38–126)
Anion gap: 8 (ref 5–15)
BUN: 8 mg/dL (ref 8–23)
CO2: 26 mmol/L (ref 22–32)
Calcium: 9.2 mg/dL (ref 8.9–10.3)
Chloride: 101 mmol/L (ref 98–111)
Creatinine, Ser: 0.45 mg/dL — ABNORMAL LOW (ref 0.61–1.24)
GFR, Estimated: >60 mL/min (ref >60)
Glucose, Bld: 88 mg/dL (ref 70–99)
Potassium: 3.8 mmol/L (ref 3.5–5.1)
Sodium: 135 mmol/L (ref 135–145)
Total Bilirubin: 0.5 mg/dL (ref 0.3–1.2)
Total Protein: 7.5 g/dL (ref 6.5–8.1)

## 2022-07-09 LAB — RAD ONC ARIA SESSION SUMMARY
Course Elapsed Days: 36
Plan Fractions Treated to Date: 26
Plan Prescribed Dose Per Fraction: 2 Gy
Plan Total Fractions Prescribed: 35
Plan Total Prescribed Dose: 70 Gy
Reference Point Dosage Given to Date: 52 Gy
Reference Point Session Dosage Given: 2 Gy
Session Number: 26

## 2022-07-09 LAB — CBC WITH DIFFERENTIAL/PLATELET
Abs Immature Granulocytes: 0.05 10*3/uL (ref 0.00–0.07)
Basophils Absolute: 0 10*3/uL (ref 0.0–0.1)
Basophils Relative: 0 %
Eosinophils Absolute: 0.1 10*3/uL (ref 0.0–0.5)
Eosinophils Relative: 1 %
HCT: 33.4 % — ABNORMAL LOW (ref 39.0–52.0)
Hemoglobin: 11.3 g/dL — ABNORMAL LOW (ref 13.0–17.0)
Immature Granulocytes: 1 %
Lymphocytes Relative: 10 %
Lymphs Abs: 0.7 10*3/uL (ref 0.7–4.0)
MCH: 30.5 pg (ref 26.0–34.0)
MCHC: 33.8 g/dL (ref 30.0–36.0)
MCV: 90.3 fL (ref 80.0–100.0)
Monocytes Absolute: 0.5 10*3/uL (ref 0.1–1.0)
Monocytes Relative: 7 %
Neutro Abs: 5.9 10*3/uL (ref 1.7–7.7)
Neutrophils Relative %: 81 %
Platelets: 254 10*3/uL (ref 150–400)
RBC: 3.7 MIL/uL — ABNORMAL LOW (ref 4.22–5.81)
RDW: 14.9 % (ref 11.5–15.5)
WBC: 7.3 10*3/uL (ref 4.0–10.5)
nRBC: 0 % (ref 0.0–0.2)

## 2022-07-09 MED ORDER — PALONOSETRON HCL INJECTION 0.25 MG/5ML
0.2500 mg | Freq: Once | INTRAVENOUS | Status: AC
  Administered 2022-07-09: 0.25 mg via INTRAVENOUS
  Filled 2022-07-09: qty 5

## 2022-07-09 MED ORDER — HEPARIN SOD (PORK) LOCK FLUSH 100 UNIT/ML IV SOLN
INTRAVENOUS | Status: AC
  Filled 2022-07-09: qty 5

## 2022-07-09 MED ORDER — DIPHENHYDRAMINE HCL 50 MG/ML IJ SOLN
50.0000 mg | Freq: Once | INTRAMUSCULAR | Status: AC
  Administered 2022-07-09: 50 mg via INTRAVENOUS
  Filled 2022-07-09: qty 1

## 2022-07-09 MED ORDER — FAMOTIDINE IN NACL 20-0.9 MG/50ML-% IV SOLN
20.0000 mg | Freq: Once | INTRAVENOUS | Status: AC
  Administered 2022-07-09: 20 mg via INTRAVENOUS
  Filled 2022-07-09: qty 50

## 2022-07-09 MED ORDER — SODIUM CHLORIDE 0.9% FLUSH
10.0000 mL | INTRAVENOUS | Status: DC | PRN
  Administered 2022-07-09: 10 mL
  Filled 2022-07-09: qty 10

## 2022-07-09 MED ORDER — SODIUM CHLORIDE 0.9 % IV SOLN
10.0000 mg | Freq: Once | INTRAVENOUS | Status: AC
  Administered 2022-07-09: 10 mg via INTRAVENOUS
  Filled 2022-07-09: qty 10
  Filled 2022-07-09 (×2): qty 1

## 2022-07-09 MED ORDER — SODIUM CHLORIDE 0.9 % IV SOLN
150.6000 mg | Freq: Once | INTRAVENOUS | Status: AC
  Administered 2022-07-09: 150 mg via INTRAVENOUS
  Filled 2022-07-09: qty 15

## 2022-07-09 MED ORDER — SODIUM CHLORIDE 0.9 % IV SOLN
Freq: Once | INTRAVENOUS | Status: AC
  Filled 2022-07-09: qty 250

## 2022-07-09 MED ORDER — SODIUM CHLORIDE 0.9 % IV SOLN
35.0000 mg/m2 | Freq: Once | INTRAVENOUS | Status: AC
  Administered 2022-07-09: 66 mg via INTRAVENOUS
  Filled 2022-07-09: qty 11

## 2022-07-09 MED ORDER — HEPARIN SOD (PORK) LOCK FLUSH 100 UNIT/ML IV SOLN
500.0000 [IU] | Freq: Once | INTRAVENOUS | Status: AC | PRN
  Administered 2022-07-09: 500 [IU]
  Filled 2022-07-09: qty 5

## 2022-07-09 NOTE — Patient Instructions (Signed)

## 2022-07-09 NOTE — Patient Instructions (Signed)
[facility] CANCER CTR AT [place]  Discharge Instructions: Thank you for choosing [facility] to provide your oncology and hematology care.  If you have a lab appointment with the [facility], please go directly to the [facility] and check in at the registration area.  Wear comfortable clothing and clothing appropriate for easy access to any Portacath or PICC line.   We strive to give you quality time with your provider. You may need to reschedule your appointment if you arrive late (15 or more minutes).  Arriving late affects you and other patients whose appointments are after yours.  Also, if you miss three or more appointments without notifying the office, you may be dismissed from the clinic at the provider's discretion.      For prescription refill requests, have your pharmacy contact our office and allow 72 hours for refills to be completed.    Today you received the following chemotherapy and/or immunotherapy agents- Taxol, Carboplatin      To help prevent nausea and vomiting after your treatment, we encourage you to take your nausea medication as directed.  BELOW ARE SYMPTOMS THAT SHOULD BE REPORTED IMMEDIATELY: *FEVER GREATER THAN 100.4 F (38 C) OR HIGHER *CHILLS OR SWEATING *NAUSEA AND VOMITING THAT IS NOT CONTROLLED WITH YOUR NAUSEA MEDICATION *UNUSUAL SHORTNESS OF BREATH *UNUSUAL BRUISING OR BLEEDING *URINARY PROBLEMS (pain or burning when urinating, or frequent urination) *BOWEL PROBLEMS (unusual diarrhea, constipation, pain near the anus) TENDERNESS IN MOUTH AND THROAT WITH OR WITHOUT PRESENCE OF ULCERS (sore throat, sores in mouth, or a toothache) UNUSUAL RASH, SWELLING OR PAIN  UNUSUAL VAGINAL DISCHARGE OR ITCHING   Items with * indicate a potential emergency and should be followed up as soon as possible or go to the Emergency Department if any problems should occur.  Please show the CHEMOTHERAPY ALERT CARD or IMMUNOTHERAPY ALERT CARD at  check-in to the Emergency Department and triage nurse.  Should you have questions after your visit or need to cancel or reschedule your appointment, please contact [facility] CANCER [place] AT [place]  [phone] and follow the prompts.  Office hours are 8:00 a.m. to 4:30 p.m. Monday - Friday. Please note that voicemails left after 4:00 p.m. may not be returned until the following business day.  We are closed weekends and major holidays. You have access to a nurse at all times for urgent questions. Please call the main number to the clinic [phone] and follow the prompts.  For any non-urgent questions, you may also contact your provider using MyChart. We now offer e-Visits for anyone 69 and older to request care online for non-urgent symptoms. For details visit [email or web address].   Also download the MyChart app! Go to the app store, search "MyChart", open the app, select [facility], and log in with your MyChart username and password.  Masks are optional in the cancer centers. If you would like for your care team to wear a mask while they are taking care of you, please let them know. For doctor visits, patients may have with them one support person who is at least 69 years old. At this time, visitors are not allowed in the infusion area.

## 2022-07-10 ENCOUNTER — Ambulatory Visit: Payer: Medicare Other

## 2022-07-12 ENCOUNTER — Other Ambulatory Visit: Payer: Self-pay

## 2022-07-12 ENCOUNTER — Inpatient Hospital Stay: Payer: Medicare Other | Admitting: Internal Medicine

## 2022-07-12 ENCOUNTER — Ambulatory Visit: Payer: Medicare Other

## 2022-07-12 ENCOUNTER — Inpatient Hospital Stay: Payer: Medicare Other

## 2022-07-12 ENCOUNTER — Ambulatory Visit
Admission: RE | Admit: 2022-07-12 | Discharge: 2022-07-12 | Disposition: A | Discharge status: Home / Self Care | Payer: Medicare Other | Source: Ambulatory Visit | Attending: Radiation Oncology | Admitting: Radiation Oncology

## 2022-07-12 DIAGNOSIS — J449 Chronic obstructive pulmonary disease, unspecified: Secondary | ICD-10-CM | POA: Diagnosis not present

## 2022-07-12 DIAGNOSIS — Z510 Encounter for antineoplastic radiation therapy: Secondary | ICD-10-CM | POA: Diagnosis not present

## 2022-07-12 DIAGNOSIS — C3411 Malignant neoplasm of upper lobe, right bronchus or lung: Secondary | ICD-10-CM | POA: Diagnosis not present

## 2022-07-12 DIAGNOSIS — D701 Agranulocytosis secondary to cancer chemotherapy: Secondary | ICD-10-CM | POA: Diagnosis not present

## 2022-07-12 DIAGNOSIS — Z452 Encounter for adjustment and management of vascular access device: Secondary | ICD-10-CM | POA: Diagnosis not present

## 2022-07-12 DIAGNOSIS — Z5111 Encounter for antineoplastic chemotherapy: Secondary | ICD-10-CM | POA: Diagnosis not present

## 2022-07-12 DIAGNOSIS — Z87891 Personal history of nicotine dependence: Secondary | ICD-10-CM | POA: Diagnosis not present

## 2022-07-12 DIAGNOSIS — I10 Essential (primary) hypertension: Secondary | ICD-10-CM | POA: Diagnosis not present

## 2022-07-12 LAB — RAD ONC ARIA SESSION SUMMARY
Course Elapsed Days: 39
Plan Fractions Treated to Date: 27
Plan Prescribed Dose Per Fraction: 2 Gy
Plan Total Fractions Prescribed: 35
Plan Total Prescribed Dose: 70 Gy
Reference Point Dosage Given to Date: 54 Gy
Reference Point Session Dosage Given: 2 Gy
Session Number: 27

## 2022-07-13 ENCOUNTER — Ambulatory Visit
Admission: RE | Admit: 2022-07-13 | Discharge: 2022-07-13 | Disposition: A | Discharge status: Home / Self Care | Payer: Medicare Other | Source: Ambulatory Visit | Attending: Radiation Oncology | Admitting: Radiation Oncology

## 2022-07-13 ENCOUNTER — Ambulatory Visit: Payer: Medicare Other

## 2022-07-13 ENCOUNTER — Other Ambulatory Visit: Payer: Self-pay | Admitting: Internal Medicine

## 2022-07-13 ENCOUNTER — Other Ambulatory Visit: Payer: Self-pay

## 2022-07-13 DIAGNOSIS — Z452 Encounter for adjustment and management of vascular access device: Secondary | ICD-10-CM | POA: Diagnosis not present

## 2022-07-13 DIAGNOSIS — I10 Essential (primary) hypertension: Secondary | ICD-10-CM | POA: Diagnosis not present

## 2022-07-13 DIAGNOSIS — C3411 Malignant neoplasm of upper lobe, right bronchus or lung: Secondary | ICD-10-CM | POA: Diagnosis not present

## 2022-07-13 DIAGNOSIS — Z87891 Personal history of nicotine dependence: Secondary | ICD-10-CM | POA: Diagnosis not present

## 2022-07-13 DIAGNOSIS — Z510 Encounter for antineoplastic radiation therapy: Secondary | ICD-10-CM | POA: Diagnosis not present

## 2022-07-13 DIAGNOSIS — Z5111 Encounter for antineoplastic chemotherapy: Secondary | ICD-10-CM | POA: Diagnosis not present

## 2022-07-13 DIAGNOSIS — J449 Chronic obstructive pulmonary disease, unspecified: Secondary | ICD-10-CM | POA: Diagnosis not present

## 2022-07-13 DIAGNOSIS — D701 Agranulocytosis secondary to cancer chemotherapy: Secondary | ICD-10-CM | POA: Diagnosis not present

## 2022-07-13 LAB — RAD ONC ARIA SESSION SUMMARY
Course Elapsed Days: 40
Plan Fractions Treated to Date: 28
Plan Prescribed Dose Per Fraction: 2 Gy
Plan Total Fractions Prescribed: 35
Plan Total Prescribed Dose: 70 Gy
Reference Point Dosage Given to Date: 56 Gy
Reference Point Session Dosage Given: 2 Gy
Session Number: 28

## 2022-07-14 ENCOUNTER — Ambulatory Visit
Admission: RE | Admit: 2022-07-14 | Discharge: 2022-07-14 | Disposition: A | Discharge status: Home / Self Care | Payer: Medicare Other | Source: Ambulatory Visit | Attending: Radiation Oncology | Admitting: Radiation Oncology

## 2022-07-14 ENCOUNTER — Other Ambulatory Visit: Payer: Self-pay

## 2022-07-14 ENCOUNTER — Ambulatory Visit: Payer: Medicare Other

## 2022-07-14 DIAGNOSIS — Z510 Encounter for antineoplastic radiation therapy: Secondary | ICD-10-CM | POA: Diagnosis not present

## 2022-07-14 DIAGNOSIS — Z5111 Encounter for antineoplastic chemotherapy: Secondary | ICD-10-CM | POA: Diagnosis not present

## 2022-07-14 DIAGNOSIS — C3411 Malignant neoplasm of upper lobe, right bronchus or lung: Secondary | ICD-10-CM | POA: Diagnosis not present

## 2022-07-14 DIAGNOSIS — J449 Chronic obstructive pulmonary disease, unspecified: Secondary | ICD-10-CM | POA: Diagnosis not present

## 2022-07-14 DIAGNOSIS — Z452 Encounter for adjustment and management of vascular access device: Secondary | ICD-10-CM | POA: Diagnosis not present

## 2022-07-14 DIAGNOSIS — Z87891 Personal history of nicotine dependence: Secondary | ICD-10-CM | POA: Diagnosis not present

## 2022-07-14 DIAGNOSIS — D701 Agranulocytosis secondary to cancer chemotherapy: Secondary | ICD-10-CM | POA: Diagnosis not present

## 2022-07-14 DIAGNOSIS — I10 Essential (primary) hypertension: Secondary | ICD-10-CM | POA: Diagnosis not present

## 2022-07-14 LAB — RAD ONC ARIA SESSION SUMMARY
Course Elapsed Days: 41
Plan Fractions Treated to Date: 29
Plan Prescribed Dose Per Fraction: 2 Gy
Plan Total Fractions Prescribed: 35
Plan Total Prescribed Dose: 70 Gy
Reference Point Dosage Given to Date: 58 Gy
Reference Point Session Dosage Given: 2 Gy
Session Number: 29

## 2022-07-19 ENCOUNTER — Ambulatory Visit: Payer: Medicare Other

## 2022-07-19 ENCOUNTER — Ambulatory Visit
Admission: RE | Admit: 2022-07-19 | Discharge: 2022-07-19 | Disposition: A | Discharge status: Home / Self Care | Payer: Medicare Other | Source: Ambulatory Visit | Attending: Radiation Oncology | Admitting: Radiation Oncology

## 2022-07-19 ENCOUNTER — Other Ambulatory Visit: Payer: Self-pay

## 2022-07-19 DIAGNOSIS — J449 Chronic obstructive pulmonary disease, unspecified: Secondary | ICD-10-CM | POA: Diagnosis not present

## 2022-07-19 DIAGNOSIS — Z510 Encounter for antineoplastic radiation therapy: Secondary | ICD-10-CM | POA: Diagnosis not present

## 2022-07-19 DIAGNOSIS — Z5111 Encounter for antineoplastic chemotherapy: Secondary | ICD-10-CM | POA: Diagnosis not present

## 2022-07-19 DIAGNOSIS — Z87891 Personal history of nicotine dependence: Secondary | ICD-10-CM | POA: Diagnosis not present

## 2022-07-19 DIAGNOSIS — C3411 Malignant neoplasm of upper lobe, right bronchus or lung: Secondary | ICD-10-CM | POA: Diagnosis not present

## 2022-07-19 DIAGNOSIS — Z452 Encounter for adjustment and management of vascular access device: Secondary | ICD-10-CM | POA: Diagnosis not present

## 2022-07-19 DIAGNOSIS — D701 Agranulocytosis secondary to cancer chemotherapy: Secondary | ICD-10-CM | POA: Diagnosis not present

## 2022-07-19 DIAGNOSIS — I10 Essential (primary) hypertension: Secondary | ICD-10-CM | POA: Diagnosis not present

## 2022-07-19 LAB — RAD ONC ARIA SESSION SUMMARY
Course Elapsed Days: 46
Plan Fractions Treated to Date: 30
Plan Prescribed Dose Per Fraction: 2 Gy
Plan Total Fractions Prescribed: 35
Plan Total Prescribed Dose: 70 Gy
Reference Point Dosage Given to Date: 60 Gy
Reference Point Session Dosage Given: 2 Gy
Session Number: 30

## 2022-07-20 ENCOUNTER — Other Ambulatory Visit: Payer: Self-pay

## 2022-07-20 ENCOUNTER — Ambulatory Visit: Payer: Medicare Other

## 2022-07-20 ENCOUNTER — Ambulatory Visit
Admission: RE | Admit: 2022-07-20 | Discharge: 2022-07-20 | Disposition: A | Discharge status: Home / Self Care | Payer: Medicare Other | Source: Ambulatory Visit | Attending: Radiation Oncology | Admitting: Radiation Oncology

## 2022-07-20 DIAGNOSIS — D701 Agranulocytosis secondary to cancer chemotherapy: Secondary | ICD-10-CM | POA: Diagnosis not present

## 2022-07-20 DIAGNOSIS — C3411 Malignant neoplasm of upper lobe, right bronchus or lung: Secondary | ICD-10-CM | POA: Diagnosis not present

## 2022-07-20 DIAGNOSIS — Z510 Encounter for antineoplastic radiation therapy: Secondary | ICD-10-CM | POA: Diagnosis not present

## 2022-07-20 DIAGNOSIS — Z87891 Personal history of nicotine dependence: Secondary | ICD-10-CM | POA: Diagnosis not present

## 2022-07-20 DIAGNOSIS — Z452 Encounter for adjustment and management of vascular access device: Secondary | ICD-10-CM | POA: Diagnosis not present

## 2022-07-20 DIAGNOSIS — J449 Chronic obstructive pulmonary disease, unspecified: Secondary | ICD-10-CM | POA: Diagnosis not present

## 2022-07-20 DIAGNOSIS — I10 Essential (primary) hypertension: Secondary | ICD-10-CM | POA: Diagnosis not present

## 2022-07-20 DIAGNOSIS — Z5111 Encounter for antineoplastic chemotherapy: Secondary | ICD-10-CM | POA: Diagnosis not present

## 2022-07-20 LAB — RAD ONC ARIA SESSION SUMMARY
Course Elapsed Days: 47
Plan Fractions Treated to Date: 31
Plan Prescribed Dose Per Fraction: 2 Gy
Plan Total Fractions Prescribed: 35
Plan Total Prescribed Dose: 70 Gy
Reference Point Dosage Given to Date: 62 Gy
Reference Point Session Dosage Given: 2 Gy
Session Number: 31

## 2022-07-21 ENCOUNTER — Other Ambulatory Visit: Payer: Self-pay

## 2022-07-21 ENCOUNTER — Encounter: Payer: Self-pay | Admitting: *Deleted

## 2022-07-21 ENCOUNTER — Ambulatory Visit
Admission: RE | Admit: 2022-07-21 | Discharge: 2022-07-21 | Disposition: A | Discharge status: Home / Self Care | Payer: Medicare Other | Source: Ambulatory Visit | Attending: Radiation Oncology | Admitting: Radiation Oncology

## 2022-07-21 ENCOUNTER — Ambulatory Visit: Payer: Medicare Other

## 2022-07-21 DIAGNOSIS — J449 Chronic obstructive pulmonary disease, unspecified: Secondary | ICD-10-CM | POA: Diagnosis not present

## 2022-07-21 DIAGNOSIS — D701 Agranulocytosis secondary to cancer chemotherapy: Secondary | ICD-10-CM | POA: Diagnosis not present

## 2022-07-21 DIAGNOSIS — Z510 Encounter for antineoplastic radiation therapy: Secondary | ICD-10-CM | POA: Diagnosis not present

## 2022-07-21 DIAGNOSIS — Z5111 Encounter for antineoplastic chemotherapy: Secondary | ICD-10-CM | POA: Diagnosis not present

## 2022-07-21 DIAGNOSIS — I10 Essential (primary) hypertension: Secondary | ICD-10-CM | POA: Diagnosis not present

## 2022-07-21 DIAGNOSIS — Z452 Encounter for adjustment and management of vascular access device: Secondary | ICD-10-CM | POA: Diagnosis not present

## 2022-07-21 DIAGNOSIS — C3411 Malignant neoplasm of upper lobe, right bronchus or lung: Secondary | ICD-10-CM | POA: Diagnosis not present

## 2022-07-21 DIAGNOSIS — Z87891 Personal history of nicotine dependence: Secondary | ICD-10-CM | POA: Diagnosis not present

## 2022-07-21 LAB — RAD ONC ARIA SESSION SUMMARY
Course Elapsed Days: 48
Plan Fractions Treated to Date: 32
Plan Prescribed Dose Per Fraction: 2 Gy
Plan Total Fractions Prescribed: 35
Plan Total Prescribed Dose: 70 Gy
Reference Point Dosage Given to Date: 64 Gy
Reference Point Session Dosage Given: 2 Gy
Session Number: 32

## 2022-07-22 ENCOUNTER — Ambulatory Visit: Payer: Medicare Other

## 2022-07-22 ENCOUNTER — Ambulatory Visit
Admission: RE | Admit: 2022-07-22 | Discharge: 2022-07-22 | Disposition: A | Discharge status: Home / Self Care | Payer: Medicare Other | Source: Ambulatory Visit | Attending: Radiation Oncology | Admitting: Radiation Oncology

## 2022-07-22 ENCOUNTER — Other Ambulatory Visit: Payer: Self-pay

## 2022-07-22 DIAGNOSIS — Z5111 Encounter for antineoplastic chemotherapy: Secondary | ICD-10-CM | POA: Diagnosis not present

## 2022-07-22 DIAGNOSIS — Z510 Encounter for antineoplastic radiation therapy: Secondary | ICD-10-CM | POA: Diagnosis not present

## 2022-07-22 DIAGNOSIS — J449 Chronic obstructive pulmonary disease, unspecified: Secondary | ICD-10-CM | POA: Diagnosis not present

## 2022-07-22 DIAGNOSIS — Z452 Encounter for adjustment and management of vascular access device: Secondary | ICD-10-CM | POA: Diagnosis not present

## 2022-07-22 DIAGNOSIS — C3411 Malignant neoplasm of upper lobe, right bronchus or lung: Secondary | ICD-10-CM | POA: Diagnosis not present

## 2022-07-22 DIAGNOSIS — Z87891 Personal history of nicotine dependence: Secondary | ICD-10-CM | POA: Diagnosis not present

## 2022-07-22 DIAGNOSIS — I10 Essential (primary) hypertension: Secondary | ICD-10-CM | POA: Diagnosis not present

## 2022-07-22 DIAGNOSIS — D701 Agranulocytosis secondary to cancer chemotherapy: Secondary | ICD-10-CM | POA: Diagnosis not present

## 2022-07-22 LAB — RAD ONC ARIA SESSION SUMMARY
Course Elapsed Days: 49
Plan Fractions Treated to Date: 33
Plan Prescribed Dose Per Fraction: 2 Gy
Plan Total Fractions Prescribed: 35
Plan Total Prescribed Dose: 70 Gy
Reference Point Dosage Given to Date: 66 Gy
Reference Point Session Dosage Given: 2 Gy
Session Number: 33

## 2022-07-23 ENCOUNTER — Ambulatory Visit: Payer: Medicare Other

## 2022-07-23 ENCOUNTER — Other Ambulatory Visit: Payer: Self-pay

## 2022-07-23 ENCOUNTER — Ambulatory Visit
Admission: RE | Admit: 2022-07-23 | Discharge: 2022-07-23 | Disposition: A | Discharge status: Home / Self Care | Payer: Medicare Other | Source: Ambulatory Visit | Attending: Radiation Oncology | Admitting: Radiation Oncology

## 2022-07-23 DIAGNOSIS — C3411 Malignant neoplasm of upper lobe, right bronchus or lung: Secondary | ICD-10-CM | POA: Insufficient documentation

## 2022-07-23 DIAGNOSIS — Z510 Encounter for antineoplastic radiation therapy: Secondary | ICD-10-CM | POA: Insufficient documentation

## 2022-07-23 DIAGNOSIS — Z5111 Encounter for antineoplastic chemotherapy: Secondary | ICD-10-CM | POA: Diagnosis not present

## 2022-07-23 DIAGNOSIS — J449 Chronic obstructive pulmonary disease, unspecified: Secondary | ICD-10-CM | POA: Diagnosis not present

## 2022-07-23 DIAGNOSIS — Z87891 Personal history of nicotine dependence: Secondary | ICD-10-CM | POA: Diagnosis not present

## 2022-07-23 LAB — RAD ONC ARIA SESSION SUMMARY
Course Elapsed Days: 50
Plan Fractions Treated to Date: 34
Plan Prescribed Dose Per Fraction: 2 Gy
Plan Total Fractions Prescribed: 35
Plan Total Prescribed Dose: 70 Gy
Reference Point Dosage Given to Date: 68 Gy
Reference Point Session Dosage Given: 2 Gy
Session Number: 34

## 2022-07-23 MED FILL — Dexamethasone Sodium Phosphate Inj 100 MG/10ML: INTRAMUSCULAR | Qty: 1 | Status: AC

## 2022-07-26 ENCOUNTER — Encounter: Payer: Self-pay | Admitting: Internal Medicine

## 2022-07-26 ENCOUNTER — Inpatient Hospital Stay (HOSPITAL_BASED_OUTPATIENT_CLINIC_OR_DEPARTMENT_OTHER): Payer: Medicare Other | Admitting: Internal Medicine

## 2022-07-26 ENCOUNTER — Ambulatory Visit
Admission: RE | Admit: 2022-07-26 | Discharge: 2022-07-26 | Disposition: A | Discharge status: Home / Self Care | Payer: Medicare Other | Source: Ambulatory Visit | Attending: Radiation Oncology | Admitting: Radiation Oncology

## 2022-07-26 ENCOUNTER — Inpatient Hospital Stay: Payer: Medicare Other

## 2022-07-26 ENCOUNTER — Other Ambulatory Visit: Payer: Self-pay

## 2022-07-26 ENCOUNTER — Inpatient Hospital Stay: Payer: Medicare Other | Attending: Internal Medicine

## 2022-07-26 VITALS — BP 129/94 | HR 72 | Temp 96.5°F | Resp 20 | Wt 170.5 lb

## 2022-07-26 DIAGNOSIS — Z5111 Encounter for antineoplastic chemotherapy: Secondary | ICD-10-CM | POA: Diagnosis not present

## 2022-07-26 DIAGNOSIS — C3491 Malignant neoplasm of unspecified part of right bronchus or lung: Secondary | ICD-10-CM

## 2022-07-26 DIAGNOSIS — Z79899 Other long term (current) drug therapy: Secondary | ICD-10-CM | POA: Insufficient documentation

## 2022-07-26 DIAGNOSIS — T451X5A Adverse effect of antineoplastic and immunosuppressive drugs, initial encounter: Secondary | ICD-10-CM | POA: Diagnosis not present

## 2022-07-26 DIAGNOSIS — C3411 Malignant neoplasm of upper lobe, right bronchus or lung: Secondary | ICD-10-CM | POA: Diagnosis not present

## 2022-07-26 DIAGNOSIS — D701 Agranulocytosis secondary to cancer chemotherapy: Secondary | ICD-10-CM | POA: Insufficient documentation

## 2022-07-26 DIAGNOSIS — I10 Essential (primary) hypertension: Secondary | ICD-10-CM | POA: Insufficient documentation

## 2022-07-26 DIAGNOSIS — D6481 Anemia due to antineoplastic chemotherapy: Secondary | ICD-10-CM | POA: Diagnosis not present

## 2022-07-26 DIAGNOSIS — D649 Anemia, unspecified: Secondary | ICD-10-CM | POA: Insufficient documentation

## 2022-07-26 DIAGNOSIS — Z87891 Personal history of nicotine dependence: Secondary | ICD-10-CM | POA: Diagnosis not present

## 2022-07-26 DIAGNOSIS — Z452 Encounter for adjustment and management of vascular access device: Secondary | ICD-10-CM | POA: Insufficient documentation

## 2022-07-26 DIAGNOSIS — Z510 Encounter for antineoplastic radiation therapy: Secondary | ICD-10-CM | POA: Diagnosis not present

## 2022-07-26 DIAGNOSIS — Z5112 Encounter for antineoplastic immunotherapy: Secondary | ICD-10-CM | POA: Insufficient documentation

## 2022-07-26 LAB — CBC WITH DIFFERENTIAL/PLATELET
Abs Immature Granulocytes: 0.01 10*3/uL (ref 0.00–0.07)
Basophils Absolute: 0 10*3/uL (ref 0.0–0.1)
Basophils Relative: 1 %
Eosinophils Absolute: 0.1 10*3/uL (ref 0.0–0.5)
Eosinophils Relative: 5 %
HCT: 35.9 % — ABNORMAL LOW (ref 39.0–52.0)
Hemoglobin: 12.2 g/dL — ABNORMAL LOW (ref 13.0–17.0)
Immature Granulocytes: 1 %
Lymphocytes Relative: 34 %
Lymphs Abs: 0.5 10*3/uL — ABNORMAL LOW (ref 0.7–4.0)
MCH: 31.1 pg (ref 26.0–34.0)
MCHC: 34 g/dL (ref 30.0–36.0)
MCV: 91.6 fL (ref 80.0–100.0)
Monocytes Absolute: 0.3 10*3/uL (ref 0.1–1.0)
Monocytes Relative: 21 %
Neutro Abs: 0.6 10*3/uL — ABNORMAL LOW (ref 1.7–7.7)
Neutrophils Relative %: 38 %
Platelets: 292 10*3/uL (ref 150–400)
RBC: 3.92 MIL/uL — ABNORMAL LOW (ref 4.22–5.81)
RDW: 15.5 % (ref 11.5–15.5)
WBC: 1.5 10*3/uL — ABNORMAL LOW (ref 4.0–10.5)
nRBC: 0 % (ref 0.0–0.2)

## 2022-07-26 LAB — COMPREHENSIVE METABOLIC PANEL
ALT: 16 U/L (ref 0–44)
AST: 17 U/L (ref 15–41)
Albumin: 3.8 g/dL (ref 3.5–5.0)
Alkaline Phosphatase: 87 U/L (ref 38–126)
Anion gap: 7 (ref 5–15)
BUN: 8 mg/dL (ref 8–23)
CO2: 29 mmol/L (ref 22–32)
Calcium: 9.1 mg/dL (ref 8.9–10.3)
Chloride: 103 mmol/L (ref 98–111)
Creatinine, Ser: 0.58 mg/dL — ABNORMAL LOW (ref 0.61–1.24)
GFR, Estimated: >60 mL/min (ref >60)
Glucose, Bld: 104 mg/dL — ABNORMAL HIGH (ref 70–99)
Potassium: 4.2 mmol/L (ref 3.5–5.1)
Sodium: 139 mmol/L (ref 135–145)
Total Bilirubin: 0.6 mg/dL (ref 0.3–1.2)
Total Protein: 7.5 g/dL (ref 6.5–8.1)

## 2022-07-26 LAB — RAD ONC ARIA SESSION SUMMARY
Course Elapsed Days: 53
Plan Fractions Treated to Date: 35
Plan Prescribed Dose Per Fraction: 2 Gy
Plan Total Fractions Prescribed: 35
Plan Total Prescribed Dose: 70 Gy
Reference Point Dosage Given to Date: 70 Gy
Reference Point Session Dosage Given: 2 Gy
Session Number: 35

## 2022-07-26 NOTE — Progress Notes (Signed)
[employer] [facility] CONSULT NOTE  Patient Care Team: [name], Ricki Rodriguez as PCP - General (Physician Assistant) Alena Bills, [facility] as Pharmacist (Pharmacist) Telford Nab, RN as Oncology Nurse Navigator  REFERRING PROVIDER: Dr. Patsey Berthold  REASON FOR REFFERAL: newly diagnosed SCCa of right lung  CANCER STAGING   Cancer Staging  Squamous cell lung cancer [facility]) Staging form: Lung, AJCC 7th Edition - Clinical: Stage IIIA (T4, N0, M0) - Signed by [name], MD on 06/03/2022  CURRENT TREATMENT- Weekly Carboplatin and Taxol with RT started 06/03/2022.   ASSESSMENT & PLAN:  Derek Webb 71 y.o. 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  #RUL of lung SCCa, atleast Stage IIIA (cT4N0M0) #Encounter for antineoplastic chemotherapy -PET CT scan was personally reviewed.  There is a large 4 cm mass in the right upper lobe partially obstructing the right upper lobe bronchus, involving the hilum and subcarinal space with postobstructive pneumonia. No lymphadenopathy.   -s/p bronchoscopy with EBUS by Dr. Patsey Berthold on 05/12/2022. -MRI brain on 05/22/2022 personally reviewed.  No evidence of metastatic disease in the brain. Small enhancing foci in the right parietal and right occipital calvarium, which are indeterminate but could represent small osseous metastases. Attention on follow-up. - not a surgical candidate  -Completed 5 cycles of CarboTaxol on 07/09/2022.  Cycle 5 required dose reduction and Zarxio support.  Today ANC low at 0.6.  I will cancel cycle 6 of chemotherapy.  He completed radiation today.  He will follow-up in 1 week to start maintenance durvalumab every 4 weeks for 12 months.  I will obtain CT chest in about 3 months to assess for response to treatment.  Will also add  CT abdomen pelvis to rule out any progression.  #Chemotherapy and radiation induced neutropenia -Plan as above  #Mild anemia  -Likely related to chemotherapy.  We will continue to monitor.  # Access - port placed on 05/28/2022   Orders Placed This Encounter  Procedures   CT Chest W Contrast    Standing Status:   Future    Standing Expiration Date:   07/26/2023    Scheduling Instructions:     Schedule in about 10 weeks    Order Specific Question:   If indicated for the ordered procedure, I authorize the administration of contrast media per Radiology protocol    Answer:   Yes    Order Specific Question:   Does the patient have a contrast media/X-ray dye allergy?    Answer:   No    Order Specific Question:   Preferred imaging location?    Answer:   [facility]   CT Abdomen Pelvis W Contrast    Standing Status:   Future    Standing Expiration Date:   07/26/2023    Scheduling Instructions:     Schedule in about 10 weeks    Order Specific Question:   If indicated for the ordered procedure, I authorize the administration of contrast media per Radiology protocol    Answer:   Yes    Order Specific Question:   Does the patient have a contrast media/X-ray dye allergy?    Answer:   No    Order Specific Question:   Preferred imaging location?    Answer:   [facility]    Order Specific Question:   Is Oral Contrast  requested for this exam?    Answer:   No oral contrast    Order Specific Question:   Reason for No Oral Contrast    Answer:   Other    Order Specific Question:   Please answer why no oral contrast is requested    Answer:   to look for metastatic dz   CBC with Differential    Standing Status:   Future    Standing Expiration Date:   08/03/2023   Comprehensive metabolic panel    Standing Status:   Future    Standing Expiration Date:   08/03/2023   T4    Standing Status:   Future    Standing Expiration Date:   08/03/2023   TSH    Standing Status:   Future     Standing Expiration Date:   08/03/2023   CBC with Differential    Standing Status:   Future    Standing Expiration Date:   08/31/2023   Comprehensive metabolic panel    Standing Status:   Future    Standing Expiration Date:   08/31/2023   No chemo today.  Deaccess port. RTC 1 week for md visit, labs, durvalumab  The total time spent in the appointment was 30 minutes encounter with patients including review of chart and various tests results, discussions about plan of care and coordination of care plan   All questions were answered. The patient knows to call the clinic with any problems, questions or concerns. No barriers to learning was detected.  [name], MD 12/4/20231:01 PM   HISTORY OF PRESENTING ILLNESS:  Derek Webb 71 y.o. 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  Patient was seen today accompanied by her Sister [name].  Denies any cough, hemoptysis, shortness of breath and weight loss. Remote smoker.  Quit in 2013. Has family history of lung cancer in father.  INTERVAL HISTORY-  Patient was seen today prior to cycle 6 of CarboTaxol. He denied any symptoms.  Completed his radiation today. Denies shortness of breath, cough, chest pain.  I have reviewed his chart and materials related to his cancer extensively and collaborated history with the patient. Summary of oncologic history is as follows: Oncology History Overview Note  History of left upper lobe lung SCC status post microwave thermal ablation in 2013 by Dr. Kathlene Cote.  He was deemed not a surgical candidate and had transportation issues for radiation treatment.   Squamous cell lung cancer ([place])  01/12/2022 Initial Diagnosis   Patient has been following with Dr. Humphrey Rolls of pulmonary for suspicious lung nodule.  CT chest in March 2021 showed post ablation scarring in LUL not changed and increased atelectasis of RML and RUL. He did not follow through.   CT chest in 01/12/2022 showed  New abrupt and slightly irregular cut off of the right upper lobe bronchus with associated right upper lobe atelectasis and postobstructive changes. Findings are concerning for endobronchial malignancy    04/08/2022 PET scan   IMPRESSION: 1. Hypermetabolic central right upper lobe lung mass nearly occluding the right upper lobe bronchus, poorly delineated on the noncontrast CT images, measuring approximately 3.8 x 2.9 cm, compatible with primary bronchogenic malignancy. 2. Postobstructive pneumonia/atelectasis throughout the basilar right upper lobe. 3. No hypermetabolic metastatic thoracic adenopathy or distant metastatic disease. 4. Stable mild post  ablation change in the left upper lobe with no metabolic evidence of local tumor recurrence in this location. 5. Dilated 4.4 cm ascending thoracic aorta. Recommend annual imaging followup by CTA or MRA   05/12/2022 Procedure   Procedure was delayed due to miscommunication about holding Plavix. S/p bronchoscopy with EBUS by Dr. Patsey Berthold. Op note mentions significant bulky adenopathy in the subcarinal space and in the right hilum.    05/12/2022 Pathology Results   DIAGNOSIS:  A.  LUNG, RIGHT UPPER LOBE; ENB-ASSISTED BIOPSY:  - SQUAMOUS CELL CARCINOMA.   LUNG, RIGHT UPPER LOBE; EBUS-ASSISTED BRUSHING:  - POSITIVE FOR MALIGNANCY.  - SQUAMOUS CELL CARCINOMA IS PRESENT.   LUNG, RIGHT UPPER LOBE; EBUS-ASSISTED LAVAGE:  - POSITIVE FOR MALIGNANCY.  - SQUAMOUS CELL CARCINOMA IS PRESENT  SUBCARINAL SPACE, RIGHT; EBUS-ASSISTED FNA:  - POSITIVE FOR MALIGNANCY.  - SQUAMOUS CELL CARCINOMA IS PRESENT   05/18/2022 Cancer Staging   Staging form: Lung, AJCC 7th Edition - Clinical: Stage IIIA (T4, N0, M0) - Signed by [name], MD on 06/03/2022   05/21/2022 Imaging    MRI brain  No evidence of metastatic disease in the brain. 2. Small enhancing foci in the right parietal and right occipital calvarium, which are indeterminate but could represent small osseous metastases. Attention on follow-up   06/03/2022 - 07/09/2022 Chemotherapy   Patient is on Treatment Plan : LUNG Carboplatin + Paclitaxel + XRT q7d     08/02/2022 -  Chemotherapy   Patient is on Treatment Plan : LUNG NSCLC Durvalumab (1500) q28d       MEDICAL HISTORY:  Past Medical History:  Diagnosis Date   Alcoholism ([place])    COPD (chronic obstructive pulmonary disease) (HCC)    SEVERE COPD -OCCASIONALLY USES OXYGEN AT NIGHT-DOES NOT USE INHALERS ON REGULAR BASIS   Elevated cholesterol    GERD (gastroesophageal reflux disease)    HOH (hard of hearing)    Hypertension    Lung cancer ([place])    LEFT UPPER LUNG CARCINOMA-NOT A CANDIDATE FOR SURGICAL RESECTION BECAUSE OF HIS COPD AND POOR CANDIDATE FOR RADIATION BECAUSE OF TRANSPORTATION PROBLEMS   Seizures ([place])    CHRONIC DILATIN - PT STATES NO SEIZURES IN PAST COUPLE OF YEARS; PT STATES HIS SEIZURES CAUSED NUMBNESS OF ARMS AND HANDS AND NOT ABLE TO MOVE HIS ARMS   Stroke ([place])    2 TO 3 YRS AGO-AFFECTED HIS SPEECH--ABLE TO AMBULATE WITHOUT ASSIST AND DOES YARD WORK.  DECREASED HEARING IN BOTH EARS SINCE STROKE.   Vitamin D deficiency     SURGICAL HISTORY: Past Surgical History:  Procedure Laterality Date   CAROTID SURGERY 2009 -LEFT     COLON POLYP EXCISION     COLON SURGERY     COLONOSCOPY WITH PROPOFOL N/A 04/18/2017   Procedure: COLONOSCOPY WITH PROPOFOL;  Surgeon: Manya Silvas, MD;  Location[facility] ENDOSCOPY;  Service: Endoscopy;  Laterality: N/A;   ESOPHAGOGASTRODUODENOSCOPY     IR GENERIC HISTORICAL  01/21/2016   IR RADIOLOGIST EVAL & MGMT 01/21/2016 Aletta Edouard, MD [facility] INTERV RAD   IR GENERIC HISTORICAL  10/22/2014   IR RADIOLOGIST EVAL & MGMT 10/22/2014 Aletta Edouard, MD [facility] INTERV RAD   IR IMAGING GUIDED PORT  INSERTION  05/28/2022   IR RADIOLOGIST EVAL & MGMT  02/02/2017   IR RADIOLOGIST EVAL & MGMT  03/08/2018   percutaneous biopsy     VIDEO BRONCHOSCOPY WITH ENDOBRONCHIAL ULTRASOUND Right 05/12/2022   Procedure: VIDEO BRONCHOSCOPY WITH ENDOBRONCHIAL ULTRASOUND;  Surgeon: Tyler Pita, MD;  Location:  [facility] ORS;  Service: Cardiopulmonary;  Laterality: Right;    SOCIAL HISTORY: Social History   Socioeconomic History   Marital status: Single    Spouse name: Not on file   Number of children: Not on file   Years of education: Not on file   Highest education level: Not on file  Occupational History   Not on file  Tobacco Use   Smoking status: Former    Packs/day: 1.00    Years: 35.00    Total pack years: 35.00    Types: Cigarettes    Quit date: 12/22/2011    Years since quitting: 10.6   Smokeless tobacco: Never  Vaping Use   Vaping Use: Never used  Substance and Sexual Activity   Alcohol use: Yes    Comment: occosionally    Drug use: No   Sexual activity: Not on file  Other Topics Concern   Not on file  Social History Narrative   Not on file   Social Determinants of Health   Financial Resource Strain: Low Risk  (11/28/2020)   Overall Financial Resource Strain (CARDIA)    Difficulty of Paying Living Expenses: Not hard at all  Food Insecurity: No Food Insecurity (06/15/2022)   Hunger Vital Sign    Worried About Running Out of Food in the Last Year: Never true    [place] in the Last Year: Never true  Transportation Needs: No Transportation Needs (06/15/2022)   PRAPARE - Hydrologist (Medical): No    Lack of Transportation (Non-Medical): No  Physical Activity: Inactive (06/15/2022)   Exercise Vital Sign    Days of Exercise per Week: 0 days    Minutes of Exercise per Session: 0 min  Stress: Stress Concern Present (06/15/2022)   [place]    Feeling of Stress : To some extent   Social Connections: Socially Isolated (06/15/2022)   Social Connection and Isolation Panel [NHANES]    Frequency of Communication with Friends and Family: More than three times a week    Frequency of Social Gatherings with Friends and Family: More than three times a week    Attends Religious Services: Never    Marine scientist or Organizations: No    Attends Archivist Meetings: Never    Marital Status: Never married  Intimate Partner Violence: Unknown (06/15/2022)   Humiliation, Afraid, Rape, and Kick questionnaire    Fear of Current or Ex-Partner: No    Emotionally Abused: Not on file    Physically Abused: No    Sexually Abused: No    FAMILY HISTORY: Family History  Problem Relation Age of Onset   Colon polyps Sister     ALLERGIES:  has No Known Allergies.  MEDICATIONS:  Current Outpatient Medications  Medication Sig Dispense Refill   aspirin 81 MG tablet Take 81 mg by mouth daily.     atorvastatin (LIPITOR) 80 MG tablet Take 1 tablet (80 mg total) by mouth daily. 90 tablet 3   budesonide-formoterol (SYMBICORT) 160-4.5 MCG/ACT inhaler Inhale 1 puff into the lungs 2 (two) times daily. 1 each 5   Cholecalciferol (VITAMIN D) 2000 UNITS CAPS Take by mouth. VITAMIN D 3 2000 IU  ONE SOFTGEL DAILY  --OTC     clopidogrel (PLAVIX) 75 MG tablet Take 1 tablet (75 mg total) by mouth daily. 90 tablet 3   enalapril (VASOTEC) 20 MG tablet Take 1 tablet (20 mg total) by mouth  daily. 90 tablet 3   ezetimibe (ZETIA) 10 MG tablet Take 1 tablet (10 mg total) by mouth daily. 90 tablet 3   Na Sulfate-K Sulfate-Mg Sulf 17.5-3.13-1.6 GM/177ML SOLN Take by mouth.     OXYGEN Inhale into the lungs. At night     pantoprazole (PROTONIX) 40 MG tablet Take 1 tablet (40 mg total) by mouth daily. IN AM 90 tablet 0   phenytoin (DILANTIN) 100 MG ER capsule Take 4 capsules (400 mg total) by mouth at bedtime. 450 capsule 1   No current facility-administered medications for this visit.     REVIEW OF SYSTEMS:   Pertinent information mentioned in HPI All other systems were reviewed with the patient and are negative.  PHYSICAL EXAMINATION: ECOG PERFORMANCE STATUS: 2 - Symptomatic, <50% confined to bed  Vitals:   07/26/22 1049  BP: (!) 129/94  Pulse: 72  Resp: 20  Temp: (!) 96.5 F (35.8 C)  SpO2: 100%   Filed Weights   07/26/22 1049  Weight: 170 lb 8 oz (77.3 kg)     GENERAL:alert, no distress and comfortable SKIN: skin color, texture, turgor are normal, no rashes or significant lesions EYES: normal, conjunctiva are pink and non-injected, sclera clear OROPHARYNX:no exudate, no erythema and lips, buccal mucosa, and tongue normal  NECK: supple, thyroid normal size, non-tender, without nodularity LYMPH:  no palpable lymphadenopathy in the cervical, axillary or inguinal LUNGS: clear to auscultation and percussion with normal breathing effort HEART: regular rate & rhythm and no murmurs and no lower extremity edema ABDOMEN:abdomen soft, non-tender and normal bowel sounds Musculoskeletal:no cyanosis of digits and no clubbing  PSYCH: alert & oriented x 3 with fluent speech NEURO: no focal motor/sensory deficits  LABORATORY DATA:  I have reviewed the data as listed Lab Results  Component Value Date   WBC 1.5 (L) 07/26/2022   HGB 12.2 (L) 07/26/2022   HCT 35.9 (L) 07/26/2022   MCV 91.6 07/26/2022   PLT 292 07/26/2022   Recent Labs    07/05/22 0820 07/09/22 1015 07/26/22 1002  NA 137 135 139  K 3.8 3.8 4.2  CL 103 101 103  CO2 28 26 29   GLUCOSE 97 88 104*  BUN 9 8 8   CREATININE 0.43* 0.45* 0.58*  CALCIUM 9.0 9.2 9.1  GFRNONAA >60 >60 >60  PROT 7.3 7.5 7.5  ALBUMIN 3.6 3.6 3.8  AST 18 15 17   ALT 16 15 16   ALKPHOS 90 104 87  BILITOT 0.3 0.5 0.6    RADIOGRAPHIC STUDIES: I have personally reviewed the radiological images as listed and agreed with the findings in the report. No results found.

## 2022-07-26 NOTE — Progress Notes (Signed)
START OFF PATHWAY REGIMEN - Non-Small Cell Lung   [id number]:Durvalumab 1,500 mg IV D1 q28 Days:   A cycle is every 28 days:     Durvalumab   **Always confirm dose/schedule in your pharmacy ordering system**  Patient Characteristics: Preoperative or Nonsurgical Candidate (Clinical Staging), Stage III - Nonsurgical Candidate (Nonsquamous and Squamous), PS ? 2 Therapeutic Status: Preoperative or Nonsurgical Candidate (Clinical Staging) AJCC T Category: cT4 AJCC N Category: cN0 AJCC M Category: cM0 AJCC 8 Stage Grouping: IIIA ECOG Performance Status: 2 Intent of Therapy: Curative Intent, Discussed with Patient

## 2022-08-02 ENCOUNTER — Other Ambulatory Visit: Payer: Medicare Other

## 2022-08-02 ENCOUNTER — Ambulatory Visit: Payer: Medicare Other

## 2022-08-02 ENCOUNTER — Ambulatory Visit: Payer: Medicare Other | Admitting: Internal Medicine

## 2022-08-03 ENCOUNTER — Encounter: Payer: Self-pay | Admitting: Internal Medicine

## 2022-08-03 ENCOUNTER — Ambulatory Visit: Payer: Medicare Other

## 2022-08-03 ENCOUNTER — Inpatient Hospital Stay: Payer: Medicare Other

## 2022-08-03 ENCOUNTER — Inpatient Hospital Stay (HOSPITAL_BASED_OUTPATIENT_CLINIC_OR_DEPARTMENT_OTHER): Payer: Medicare Other | Admitting: Internal Medicine

## 2022-08-03 VITALS — BP 126/86 | HR 63 | Temp 97.6°F | Resp 20 | Wt 172.6 lb

## 2022-08-03 DIAGNOSIS — D6481 Anemia due to antineoplastic chemotherapy: Secondary | ICD-10-CM | POA: Diagnosis not present

## 2022-08-03 DIAGNOSIS — T451X5A Adverse effect of antineoplastic and immunosuppressive drugs, initial encounter: Secondary | ICD-10-CM | POA: Diagnosis not present

## 2022-08-03 DIAGNOSIS — D649 Anemia, unspecified: Secondary | ICD-10-CM | POA: Diagnosis not present

## 2022-08-03 DIAGNOSIS — C3491 Malignant neoplasm of unspecified part of right bronchus or lung: Secondary | ICD-10-CM

## 2022-08-03 DIAGNOSIS — D701 Agranulocytosis secondary to cancer chemotherapy: Secondary | ICD-10-CM | POA: Insufficient documentation

## 2022-08-03 DIAGNOSIS — C3411 Malignant neoplasm of upper lobe, right bronchus or lung: Secondary | ICD-10-CM | POA: Insufficient documentation

## 2022-08-03 DIAGNOSIS — Z95828 Presence of other vascular implants and grafts: Secondary | ICD-10-CM

## 2022-08-03 DIAGNOSIS — Z452 Encounter for adjustment and management of vascular access device: Secondary | ICD-10-CM | POA: Diagnosis not present

## 2022-08-03 DIAGNOSIS — I10 Essential (primary) hypertension: Secondary | ICD-10-CM | POA: Diagnosis not present

## 2022-08-03 DIAGNOSIS — Z79899 Other long term (current) drug therapy: Secondary | ICD-10-CM | POA: Diagnosis not present

## 2022-08-03 DIAGNOSIS — Z5112 Encounter for antineoplastic immunotherapy: Secondary | ICD-10-CM | POA: Diagnosis not present

## 2022-08-03 LAB — CBC WITH DIFFERENTIAL/PLATELET
Abs Immature Granulocytes: 0.01 10*3/uL (ref 0.00–0.07)
Basophils Absolute: 0 10*3/uL (ref 0.0–0.1)
Basophils Relative: 2 %
Eosinophils Absolute: 0.1 10*3/uL (ref 0.0–0.5)
Eosinophils Relative: 3 %
HCT: 33.3 % — ABNORMAL LOW (ref 39.0–52.0)
Hemoglobin: 11.4 g/dL — ABNORMAL LOW (ref 13.0–17.0)
Immature Granulocytes: 1 %
Lymphocytes Relative: 31 %
Lymphs Abs: 0.6 10*3/uL — ABNORMAL LOW (ref 0.7–4.0)
MCH: 31.2 pg (ref 26.0–34.0)
MCHC: 34.2 g/dL (ref 30.0–36.0)
MCV: 91.2 fL (ref 80.0–100.0)
Monocytes Absolute: 0.5 10*3/uL (ref 0.1–1.0)
Monocytes Relative: 25 %
Neutro Abs: 0.8 10*3/uL — ABNORMAL LOW (ref 1.7–7.7)
Neutrophils Relative %: 38 %
Platelets: 323 10*3/uL (ref 150–400)
RBC: 3.65 MIL/uL — ABNORMAL LOW (ref 4.22–5.81)
RDW: 15.9 % — ABNORMAL HIGH (ref 11.5–15.5)
WBC: 2 10*3/uL — ABNORMAL LOW (ref 4.0–10.5)
nRBC: 0 % (ref 0.0–0.2)

## 2022-08-03 LAB — COMPREHENSIVE METABOLIC PANEL
ALT: 16 U/L (ref 0–44)
AST: 17 U/L (ref 15–41)
Albumin: 3.5 g/dL (ref 3.5–5.0)
Alkaline Phosphatase: 83 U/L (ref 38–126)
Anion gap: 8 (ref 5–15)
BUN: 9 mg/dL (ref 8–23)
CO2: 25 mmol/L (ref 22–32)
Calcium: 8.6 mg/dL — ABNORMAL LOW (ref 8.9–10.3)
Chloride: 105 mmol/L (ref 98–111)
Creatinine, Ser: 0.47 mg/dL — ABNORMAL LOW (ref 0.61–1.24)
GFR, Estimated: >60 mL/min (ref >60)
Glucose, Bld: 87 mg/dL (ref 70–99)
Potassium: 3.9 mmol/L (ref 3.5–5.1)
Sodium: 138 mmol/L (ref 135–145)
Total Bilirubin: 0.6 mg/dL (ref 0.3–1.2)
Total Protein: 7.1 g/dL (ref 6.5–8.1)

## 2022-08-03 LAB — TSH: TSH: 1.271 u[IU]/mL (ref 0.350–4.500)

## 2022-08-03 MED ORDER — HEPARIN SOD (PORK) LOCK FLUSH 100 UNIT/ML IV SOLN
500.0000 [IU] | Freq: Once | INTRAVENOUS | Status: AC
  Administered 2022-08-03: 500 [IU] via INTRAVENOUS
  Filled 2022-08-03: qty 5

## 2022-08-03 NOTE — Progress Notes (Signed)
Patient has no concerns 

## 2022-08-03 NOTE — Progress Notes (Addendum)
Derek Webb CONSULT NOTE  Patient Care Team: [name], Derek Webb as PCP - General (Physician Assistant) Alena Bills, [facility] as Pharmacist (Pharmacist) Telford Nab, RN as Oncology Nurse Navigator   CANCER STAGING   Cancer Staging  Squamous cell lung cancer [facility]) Staging form: Lung, AJCC 7th Edition - Clinical: Stage IIIA (T4, N0, M0) - Signed by [name], MD on 06/03/2022  CURRENT TREATMENT- Weekly Carboplatin and Taxol with RT completed on 07/27/2022.  Maintenance Durvalumab started 08/03/2022. Plan total 12 cycles.   ASSESSMENT & PLAN:  Derek Webb 71 y.o. 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  #RUL of lung SCCa, atleast Stage IIIA (cT4N0M0) #Encounter for antineoplastic chemotherapy -PET CT scan was personally reviewed.  There is a large 4 cm mass in the right upper lobe partially obstructing the right upper lobe bronchus, involving the hilum and subcarinal space with postobstructive pneumonia. No lymphadenopathy.   -s/p bronchoscopy with EBUS by Dr. Patsey Berthold on 05/12/2022. -MRI brain on 05/22/2022 personally reviewed.  No evidence of metastatic disease in the brain. Small enhancing foci in the right parietal and right occipital calvarium, which are indeterminate but could represent small osseous metastases. Attention on follow-up. - not a surgical candidate  -Completed 5 cycles of CarboTaxol and RT on 07/27/2022.  Cycle 5 required dose reduction and Zarxio support.  Cycle 6 was canceled due to persistent neutropenia.  Labs reviewed.  Patient continues to be neutropenic although improved.  I will wait until his [place] is more than 1000 then plan to start cycle 1 maintenance durvalumab.  Follow-up in 10 days with labs.  Schedule CT imaging to assess treatment  response.   #Chemotherapy and radiation induced neutropenia -Plan as above  #Mild anemia  -Likely related to chemotherapy.  We will continue to monitor.  # Access - port placed on 05/28/2022   No orders of the defined types were placed in this encounter.  RTC on 08/12/2022 for MD visit, labs, cycle 1 durvalumab  The total time spent in the appointment was 30 minutes encounter with patients including review of chart and various tests results, discussions about plan of care and coordination of care plan   All questions were answered. The patient knows to call the clinic with any problems, questions or concerns. No barriers to learning was detected.  [name], MD 12/12/20239:46 AM   HISTORY OF PRESENTING ILLNESS:  Derek Webb 71 y.o. 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  Patient was seen today accompanied by her Sister [name].  Denies any cough, hemoptysis, shortness of breath and weight loss. Remote smoker.  Quit in 2013. Has family history of lung cancer in father.  INTERVAL HISTORY-  Patient seen today prior to cycle 1 of durvalumab. Patient reports feeling well. Patient denies fever, chills, nausea, vomiting, shortness of breath, cough, abdominal pain, bleeding, bowel or bladder issues. Energy level is good.  Appetite is good.  Denies any weight loss.    I have reviewed his chart and materials related to his cancer extensively and collaborated history with the patient. Summary of oncologic history is as follows: Oncology History Overview Note  History of left upper lobe lung SCC status post microwave thermal ablation in 2013 by Dr. Kathlene Cote.  He was deemed not a surgical candidate and had transportation issues for radiation treatment.   Squamous cell lung  cancer ([place])  01/12/2022 Initial Diagnosis   Patient has been following with Dr. Humphrey Rolls of pulmonary for suspicious lung nodule. CT chest in March 2021 showed post ablation scarring in LUL not changed and increased atelectasis of RML and RUL. He did not follow through.   CT chest in 01/12/2022 showed  New abrupt and slightly irregular cut off of the right upper lobe bronchus with associated right upper lobe atelectasis and postobstructive changes. Findings are concerning for endobronchial malignancy    04/08/2022 PET scan   IMPRESSION: 1. Hypermetabolic central right upper lobe lung mass nearly occluding the right upper lobe bronchus, poorly delineated on the noncontrast CT images, measuring approximately 3.8 x 2.9 cm, compatible with primary bronchogenic malignancy. 2. Postobstructive pneumonia/atelectasis throughout the basilar right upper lobe. 3. No hypermetabolic metastatic thoracic adenopathy or distant metastatic disease. 4. Stable mild post ablation change in the left upper lobe with no metabolic evidence of local tumor recurrence in this location. 5. Dilated 4.4 cm ascending thoracic aorta. Recommend annual imaging followup by CTA or MRA   05/12/2022 Procedure   Procedure was delayed due to miscommunication about holding Plavix. S/p bronchoscopy with EBUS by Dr. Patsey Berthold. Op note mentions significant bulky adenopathy in the subcarinal space and in the right hilum.    05/12/2022 Pathology Results   DIAGNOSIS:  A.  LUNG, RIGHT UPPER LOBE; ENB-ASSISTED BIOPSY:  - SQUAMOUS CELL CARCINOMA.   LUNG, RIGHT UPPER LOBE; EBUS-ASSISTED BRUSHING:  - POSITIVE FOR MALIGNANCY.  - SQUAMOUS CELL CARCINOMA IS PRESENT.   LUNG, RIGHT UPPER LOBE; EBUS-ASSISTED LAVAGE:  - POSITIVE FOR MALIGNANCY.  - SQUAMOUS CELL CARCINOMA IS PRESENT  SUBCARINAL SPACE, RIGHT; EBUS-ASSISTED FNA:  - POSITIVE FOR MALIGNANCY.  - SQUAMOUS CELL CARCINOMA IS PRESENT   05/18/2022 Cancer Staging   Staging form:  Lung, AJCC 7th Edition - Clinical: Stage IIIA (T4, N0, M0) - Signed by [name], MD on 06/03/2022   05/21/2022 Imaging   MRI brain  No evidence of metastatic disease in the brain. 2. Small enhancing foci in the right parietal and right occipital calvarium, which are indeterminate but could represent small osseous metastases. Attention on follow-up   06/03/2022 - 07/27/2022 Chemotherapy   Patient is on Treatment Plan : LUNG Carboplatin + Paclitaxel + XRT q7d      08/03/2022 -  Chemotherapy   Patient is on Treatment Plan : LUNG NSCLC Durvalumab (1500) q28d        MEDICAL HISTORY:  Past Medical History:  Diagnosis Date   Alcoholism ([place])    COPD (chronic obstructive pulmonary disease) (HCC)    SEVERE COPD -OCCASIONALLY USES OXYGEN AT NIGHT-DOES NOT USE INHALERS ON REGULAR BASIS   Elevated cholesterol    GERD (gastroesophageal reflux disease)    HOH (hard of hearing)    Hypertension    Lung cancer (HCC)    LEFT UPPER LUNG CARCINOMA-NOT A CANDIDATE FOR SURGICAL RESECTION BECAUSE OF HIS COPD AND POOR CANDIDATE FOR RADIATION BECAUSE OF TRANSPORTATION PROBLEMS   Seizures ([place])    CHRONIC DILATIN - PT STATES NO SEIZURES IN PAST COUPLE OF YEARS; PT STATES HIS SEIZURES CAUSED NUMBNESS OF ARMS AND HANDS AND NOT ABLE TO MOVE HIS ARMS   Stroke ([place])    2 TO 3 YRS AGO-AFFECTED HIS SPEECH--ABLE TO AMBULATE WITHOUT  ASSIST AND DOES YARD WORK.  DECREASED HEARING IN BOTH EARS SINCE STROKE.   Vitamin D deficiency     SURGICAL HISTORY: Past Surgical History:  Procedure Laterality Date   CAROTID SURGERY 2009 -LEFT     COLON POLYP EXCISION     COLON SURGERY     COLONOSCOPY WITH PROPOFOL N/A 04/18/2017   Procedure: COLONOSCOPY WITH PROPOFOL;  Surgeon: Manya Silvas, MD;  Location: [facility] ENDOSCOPY;  Service: Endoscopy;  Laterality: N/A;   ESOPHAGOGASTRODUODENOSCOPY     IR GENERIC HISTORICAL  01/21/2016   IR RADIOLOGIST EVAL & MGMT 01/21/2016 Aletta Edouard, MD [facility] INTERV RAD   IR  GENERIC HISTORICAL  10/22/2014   IR RADIOLOGIST EVAL & MGMT 10/22/2014 Aletta Edouard, MD [facility] INTERV RAD   IR IMAGING GUIDED PORT INSERTION  05/28/2022   IR RADIOLOGIST EVAL & MGMT  02/02/2017   IR RADIOLOGIST EVAL & MGMT  03/08/2018   percutaneous biopsy     VIDEO BRONCHOSCOPY WITH ENDOBRONCHIAL ULTRASOUND Right 05/12/2022   Procedure: VIDEO BRONCHOSCOPY WITH ENDOBRONCHIAL ULTRASOUND;  Surgeon: Tyler Pita, MD;  Location: [facility] ORS;  Service: Cardiopulmonary;  Laterality: Right;    SOCIAL HISTORY: Social History   Socioeconomic History   Marital status: Single    Spouse name: Not on file   Number of children: Not on file   Years of education: Not on file   Highest education level: Not on file  Occupational History   Not on file  Tobacco Use   Smoking status: Former    Packs/day: 1.00    Years: 35.00    Total pack years: 35.00    Types: Cigarettes    Quit date: 12/22/2011    Years since quitting: 10.6   Smokeless tobacco: Never  Vaping Use   Vaping Use: Never used  Substance and Sexual Activity   Alcohol use: Yes    Comment: occosionally    Drug use: No   Sexual activity: Not on file  Other Topics Concern   Not on file  Social History Narrative   Not on file   Social Determinants of Health   Financial Resource Strain: Low Risk  (11/28/2020)   Overall Financial Resource Strain (CARDIA)    Difficulty of Paying Living Expenses: Not hard at all  Food Insecurity: No Food Insecurity (06/15/2022)   Hunger Vital Sign    Worried About Running Out of Food in the Last Year: Never true    [place] in the Last Year: Never true  Transportation Needs: No Transportation Needs (06/15/2022)   PRAPARE - Hydrologist (Medical): No    Lack of Transportation (Non-Medical): No  Physical Activity: Inactive (06/15/2022)   Exercise Vital Sign    Days of Exercise per Week: 0 days    Minutes of Exercise per Session: 0 min  Stress: Stress Concern Present  (06/15/2022)   [place]    Feeling of Stress : To some extent  Social Connections: Socially Isolated (06/15/2022)   Social Connection and Isolation Panel [NHANES]    Frequency of Communication with Friends and Family: More than three times a week    Frequency of Social Gatherings with Friends and Family: More than three times a week    Attends Religious Services: Never    Marine scientist or Organizations: No    Attends Archivist Meetings: Never    Marital Status: Never married  Intimate Partner Violence: Unknown (06/15/2022)  Humiliation, Afraid, Rape, and Kick questionnaire    Fear of Current or Ex-Partner: No    Emotionally Abused: Not on file    Physically Abused: No    Sexually Abused: No    FAMILY HISTORY: Family History  Problem Relation Age of Onset   Colon polyps Sister     ALLERGIES:  has No Known Allergies.  MEDICATIONS:  Current Outpatient Medications  Medication Sig Dispense Refill   aspirin 81 MG tablet Take 81 mg by mouth daily.     atorvastatin (LIPITOR) 80 MG tablet Take 1 tablet (80 mg total) by mouth daily. 90 tablet 3   budesonide-formoterol (SYMBICORT) 160-4.5 MCG/ACT inhaler Inhale 1 puff into the lungs 2 (two) times daily. 1 each 5   Cholecalciferol (VITAMIN D) 2000 UNITS CAPS Take by mouth. VITAMIN D 3 2000 IU  ONE SOFTGEL DAILY  --OTC     clopidogrel (PLAVIX) 75 MG tablet Take 1 tablet (75 mg total) by mouth daily. 90 tablet 3   enalapril (VASOTEC) 20 MG tablet Take 1 tablet (20 mg total) by mouth daily. 90 tablet 3   ezetimibe (ZETIA) 10 MG tablet Take 1 tablet (10 mg total) by mouth daily. 90 tablet 3   Na Sulfate-K Sulfate-Mg Sulf 17.5-3.13-1.6 GM/177ML SOLN Take by mouth.     OXYGEN Inhale into the lungs. At night     pantoprazole (PROTONIX) 40 MG tablet Take 1 tablet (40 mg total) by mouth daily. IN AM 90 tablet 0   phenytoin (DILANTIN) 100 MG ER capsule Take 4  capsules (400 mg total) by mouth at bedtime. 450 capsule 1   No current facility-administered medications for this visit.    REVIEW OF SYSTEMS:   Pertinent information mentioned in HPI All other systems were reviewed with the patient and are negative.  PHYSICAL EXAMINATION: ECOG PERFORMANCE STATUS: 2 - Symptomatic, <50% confined to bed  There were no vitals filed for this visit.  There were no vitals filed for this visit.    GENERAL:alert, no distress and comfortable SKIN: skin color, texture, turgor are normal, no rashes or significant lesions EYES: normal, conjunctiva are pink and non-injected, sclera clear OROPHARYNX:no exudate, no erythema and lips, buccal mucosa, and tongue normal  NECK: supple, thyroid normal size, non-tender, without nodularity LYMPH:  no palpable lymphadenopathy in the cervical, axillary or inguinal LUNGS: clear to auscultation and percussion with normal breathing effort HEART: regular rate & rhythm and no murmurs and no lower extremity edema ABDOMEN:abdomen soft, non-tender and normal bowel sounds Musculoskeletal:no cyanosis of digits and no clubbing  PSYCH: alert & oriented x 3 with fluent speech NEURO: no focal motor/sensory deficits  LABORATORY DATA:  I have reviewed the data as listed Lab Results  Component Value Date   WBC 1.5 (L) 07/26/2022   HGB 12.2 (L) 07/26/2022   HCT 35.9 (L) 07/26/2022   MCV 91.6 07/26/2022   PLT 292 07/26/2022   Recent Labs    07/05/22 0820 07/09/22 1015 07/26/22 1002  NA 137 135 139  K 3.8 3.8 4.2  CL 103 101 103  CO2 28 26 29   GLUCOSE 97 88 104*  BUN 9 8 8   CREATININE 0.43* 0.45* 0.58*  CALCIUM 9.0 9.2 9.1  GFRNONAA >60 >60 >60  PROT 7.3 7.5 7.5  ALBUMIN 3.6 3.6 3.8  AST 18 15 17   ALT 16 15 16   ALKPHOS 90 104 87  BILITOT 0.3 0.5 0.6    RADIOGRAPHIC STUDIES: I have personally reviewed the radiological images as listed and  agreed with the findings in the report. No results found.

## 2022-08-04 LAB — T4: T4, Total: 5.3 ug/dL (ref 4.5–12.0)

## 2022-08-10 ENCOUNTER — Encounter: Payer: Self-pay | Admitting: *Deleted

## 2022-08-12 ENCOUNTER — Ambulatory Visit: Payer: Medicare Other

## 2022-08-12 ENCOUNTER — Inpatient Hospital Stay (HOSPITAL_BASED_OUTPATIENT_CLINIC_OR_DEPARTMENT_OTHER): Payer: Medicare Other | Admitting: Internal Medicine

## 2022-08-12 ENCOUNTER — Inpatient Hospital Stay: Payer: Medicare Other

## 2022-08-12 ENCOUNTER — Other Ambulatory Visit: Payer: Medicare Other

## 2022-08-12 ENCOUNTER — Ambulatory Visit: Payer: Medicare Other | Admitting: Internal Medicine

## 2022-08-12 ENCOUNTER — Encounter: Payer: Self-pay | Admitting: Internal Medicine

## 2022-08-12 VITALS — BP 121/85 | HR 65 | Temp 97.2°F | Resp 18

## 2022-08-12 DIAGNOSIS — D6481 Anemia due to antineoplastic chemotherapy: Secondary | ICD-10-CM | POA: Diagnosis not present

## 2022-08-12 DIAGNOSIS — T451X5A Adverse effect of antineoplastic and immunosuppressive drugs, initial encounter: Secondary | ICD-10-CM

## 2022-08-12 DIAGNOSIS — I10 Essential (primary) hypertension: Secondary | ICD-10-CM | POA: Diagnosis not present

## 2022-08-12 DIAGNOSIS — C3491 Malignant neoplasm of unspecified part of right bronchus or lung: Secondary | ICD-10-CM | POA: Diagnosis not present

## 2022-08-12 DIAGNOSIS — D649 Anemia, unspecified: Secondary | ICD-10-CM | POA: Diagnosis not present

## 2022-08-12 DIAGNOSIS — C3411 Malignant neoplasm of upper lobe, right bronchus or lung: Secondary | ICD-10-CM | POA: Diagnosis not present

## 2022-08-12 DIAGNOSIS — Z452 Encounter for adjustment and management of vascular access device: Secondary | ICD-10-CM | POA: Diagnosis not present

## 2022-08-12 DIAGNOSIS — D701 Agranulocytosis secondary to cancer chemotherapy: Secondary | ICD-10-CM | POA: Diagnosis not present

## 2022-08-12 DIAGNOSIS — Z5112 Encounter for antineoplastic immunotherapy: Secondary | ICD-10-CM | POA: Diagnosis not present

## 2022-08-12 DIAGNOSIS — Z79899 Other long term (current) drug therapy: Secondary | ICD-10-CM | POA: Diagnosis not present

## 2022-08-12 DIAGNOSIS — E538 Deficiency of other specified B group vitamins: Secondary | ICD-10-CM

## 2022-08-12 LAB — CBC WITH DIFFERENTIAL/PLATELET
Abs Immature Granulocytes: 0 10*3/uL (ref 0.00–0.07)
Basophils Absolute: 0 10*3/uL (ref 0.0–0.1)
Basophils Relative: 1 %
Eosinophils Absolute: 0.1 10*3/uL (ref 0.0–0.5)
Eosinophils Relative: 3 %
HCT: 35.7 % — ABNORMAL LOW (ref 39.0–52.0)
Hemoglobin: 12.4 g/dL — ABNORMAL LOW (ref 13.0–17.0)
Immature Granulocytes: 0 %
Lymphocytes Relative: 34 %
Lymphs Abs: 0.7 10*3/uL (ref 0.7–4.0)
MCH: 31.3 pg (ref 26.0–34.0)
MCHC: 34.7 g/dL (ref 30.0–36.0)
MCV: 90.2 fL (ref 80.0–100.0)
Monocytes Absolute: 0.4 10*3/uL (ref 0.1–1.0)
Monocytes Relative: 22 %
Neutro Abs: 0.8 10*3/uL — ABNORMAL LOW (ref 1.7–7.7)
Neutrophils Relative %: 40 %
Platelets: 352 10*3/uL (ref 150–400)
RBC: 3.96 MIL/uL — ABNORMAL LOW (ref 4.22–5.81)
RDW: 15.2 % (ref 11.5–15.5)
WBC: 2 10*3/uL — ABNORMAL LOW (ref 4.0–10.5)
nRBC: 0 % (ref 0.0–0.2)

## 2022-08-12 LAB — COMPREHENSIVE METABOLIC PANEL
ALT: 19 U/L (ref 0–44)
AST: 20 U/L (ref 15–41)
Albumin: 3.8 g/dL (ref 3.5–5.0)
Alkaline Phosphatase: 87 U/L (ref 38–126)
Anion gap: 7 (ref 5–15)
BUN: 7 mg/dL — ABNORMAL LOW (ref 8–23)
CO2: 28 mmol/L (ref 22–32)
Calcium: 9 mg/dL (ref 8.9–10.3)
Chloride: 103 mmol/L (ref 98–111)
Creatinine, Ser: 0.56 mg/dL — ABNORMAL LOW (ref 0.61–1.24)
GFR, Estimated: >60 mL/min (ref >60)
Glucose, Bld: 104 mg/dL — ABNORMAL HIGH (ref 70–99)
Potassium: 4.1 mmol/L (ref 3.5–5.1)
Sodium: 138 mmol/L (ref 135–145)
Total Bilirubin: 0.5 mg/dL (ref 0.3–1.2)
Total Protein: 7.3 g/dL (ref 6.5–8.1)

## 2022-08-12 LAB — TSH: TSH: 1.117 u[IU]/mL (ref 0.350–4.500)

## 2022-08-12 MED ORDER — SODIUM CHLORIDE 0.9 % IV SOLN
Freq: Once | INTRAVENOUS | Status: AC
  Filled 2022-08-12: qty 250

## 2022-08-12 MED ORDER — CYANOCOBALAMIN 1000 MCG/ML IJ SOLN
1000.0000 ug | Freq: Once | INTRAMUSCULAR | Status: AC
  Administered 2022-08-12: 1000 ug via INTRAMUSCULAR

## 2022-08-12 MED ORDER — HEPARIN SOD (PORK) LOCK FLUSH 100 UNIT/ML IV SOLN
500.0000 [IU] | Freq: Once | INTRAVENOUS | Status: AC | PRN
  Administered 2022-08-12: 500 [IU]
  Filled 2022-08-12: qty 5

## 2022-08-12 MED ORDER — SODIUM CHLORIDE 0.9 % IV SOLN
1500.0000 mg | Freq: Once | INTRAVENOUS | Status: AC
  Administered 2022-08-12: 1500 mg via INTRAVENOUS
  Filled 2022-08-12: qty 30

## 2022-08-12 MED ORDER — SODIUM CHLORIDE 0.9% FLUSH
10.0000 mL | INTRAVENOUS | Status: DC | PRN
  Administered 2022-08-12: 10 mL
  Filled 2022-08-12: qty 10

## 2022-08-12 NOTE — Progress Notes (Signed)
Patient here today for follow up regarding lung cancer. Patient and sister deny any concerns today.

## 2022-08-12 NOTE — Patient Instructions (Signed)
[facility] CANCER CTR AT [place]  Discharge Instructions: Thank you for choosing [facility] to provide your oncology and hematology care.  If you have a lab appointment with the [facility], please go directly to the [facility] and check in at the registration area.  Wear comfortable clothing and clothing appropriate for easy access to any Portacath or PICC line.   We strive to give you quality time with your provider. You may need to reschedule your appointment if you arrive late (15 or more minutes).  Arriving late affects you and other patients whose appointments are after yours.  Also, if you miss three or more appointments without notifying the office, you may be dismissed from the clinic at the provider's discretion.      For prescription refill requests, have your pharmacy contact our office and allow 72 hours for refills to be completed.    Today you received the following chemotherapy and/or immunotherapy agents durvalumab    To help prevent nausea and vomiting after your treatment, we encourage you to take your nausea medication as directed.  BELOW ARE SYMPTOMS THAT SHOULD BE REPORTED IMMEDIATELY: *FEVER GREATER THAN 100.4 F (38 C) OR HIGHER *CHILLS OR SWEATING *NAUSEA AND VOMITING THAT IS NOT CONTROLLED WITH YOUR NAUSEA MEDICATION *UNUSUAL SHORTNESS OF BREATH *UNUSUAL BRUISING OR BLEEDING *URINARY PROBLEMS (pain or burning when urinating, or frequent urination) *BOWEL PROBLEMS (unusual diarrhea, constipation, pain near the anus) TENDERNESS IN MOUTH AND THROAT WITH OR WITHOUT PRESENCE OF ULCERS (sore throat, sores in mouth, or a toothache) UNUSUAL RASH, SWELLING OR PAIN  UNUSUAL VAGINAL DISCHARGE OR ITCHING   Items with * indicate a potential emergency and should be followed up as soon as possible or go to the Emergency Department if any problems should occur.  Please show the CHEMOTHERAPY ALERT CARD or IMMUNOTHERAPY ALERT CARD at check-in to  the Emergency Department and triage nurse.  Should you have questions after your visit or need to cancel or reschedule your appointment, please contact [facility] CANCER [place] AT [employer]  [phone] and follow the prompts.  Office hours are 8:00 a.m. to 4:30 p.m. Monday - Friday. Please note that voicemails left after 4:00 p.m. may not be returned until the following business day.  We are closed weekends and major holidays. You have access to a nurse at all times for urgent questions. Please call the main number to the clinic [phone] and follow the prompts.  For any non-urgent questions, you may also contact your provider using MyChart. We now offer e-Visits for anyone 72 and older to request care online for non-urgent symptoms. For details visit [email or web address].   Also download the MyChart app! Go to the app store, search "MyChart", open the app, select [facility], and log in with your MyChart username and password.  Masks are optional in the cancer centers. If you would like for your care team to wear a mask while they are taking care of you, please let them know. For doctor visits, patients may have with them one support person who is at least 69 years old. At this time, visitors are not allowed in the infusion area.  Durvalumab Injection What is this medication? DURVALUMAB (dur VAL ue mab) treats some types of cancer. It works by helping your immune system slow or stop the spread of cancer cells. It is a monoclonal antibody. This medicine may be used for other purposes; ask your health care provider or pharmacist if you have questions. COMMON BRAND NAME(S):  IMFINZI What should I tell my care team before I take this medication? They need to know if you have any of these conditions: Allogeneic stem cell transplant (uses someone else's stem cells) Autoimmune diseases, such as Crohn disease, ulcerative colitis, lupus History of chest radiation Nervous system problems,  such as Guillain-Barre syndrome, myasthenia gravis Organ transplant An unusual or allergic reaction to durvalumab, other medications, foods, dyes, or preservatives Pregnant or trying to get pregnant Breast-feeding How should I use this medication? This medication is infused into a vein. It is given by your care team in a hospital or clinic setting. A special MedGuide will be given to you before each treatment. Be sure to read this information carefully each time. Talk to your care team about the use of this medication in children. Special care may be needed. Overdosage: If you think you have taken too much of this medicine contact a poison control center or emergency room at once. NOTE: This medicine is only for you. Do not share this medicine with others. What if I miss a dose? Keep appointments for follow-up doses. It is important not to miss your dose. Call your care team if you are unable to keep an appointment. What may interact with this medication? Interactions have not been studied. This list may not describe all possible interactions. Give your health care provider a list of all the medicines, herbs, non-prescription drugs, or dietary supplements you use. Also tell them if you smoke, drink alcohol, or use illegal drugs. Some items may interact with your medicine. What should I watch for while using this medication? Your condition will be monitored carefully while you are receiving this medication. You may need blood work while taking this medication. This medication may cause serious skin reactions. They can happen weeks to months after starting the medication. Contact your care team right away if you notice fevers or flu-like symptoms with a rash. The rash may be red or purple and then turn into blisters or peeling of the skin. You may also notice a red rash with swelling of the face, lips, or lymph nodes in your neck or under your arms. Tell your care team right away if you have any  change in your eyesight. Talk to your care team if you may be pregnant. Serious birth defects can occur if you take this medication during pregnancy and for 3 months after the last dose. You will need a negative pregnancy test before starting this medication. Contraception is recommended while taking this medication and for 3 months after the last dose. Your care team can help you find the option that works for you. Do not breastfeed while taking this medication and for 3 months after the last dose. What side effects may I notice from receiving this medication? Side effects that you should report to your care team as soon as possible: Allergic reactions--skin rash, itching, hives, swelling of the face, lips, tongue, or throat Dry cough, shortness of breath or trouble breathing Eye pain, redness, irritation, or discharge with blurry or decreased vision Heart muscle inflammation--unusual weakness or fatigue, shortness of breath, chest pain, fast or irregular heartbeat, dizziness, swelling of the ankles, feet, or hands Hormone gland problems--headache, sensitivity to light, unusual weakness or fatigue, dizziness, fast or irregular heartbeat, increased sensitivity to cold or heat, excessive sweating, constipation, hair loss, increased thirst or amount of urine, tremors or shaking, irritability Infusion reactions--chest pain, shortness of breath or trouble breathing, feeling faint or lightheaded Kidney injury (glomerulonephritis)--decrease in  the amount of urine, red or dark brown urine, foamy or bubbly urine, swelling of the ankles, hands, or feet Liver injury--right upper belly pain, loss of appetite, nausea, light-colored stool, dark yellow or brown urine, yellowing skin or eyes, unusual weakness or fatigue Pain, tingling, or numbness in the hands or feet, muscle weakness, change in vision, confusion or trouble speaking, loss of balance or coordination, trouble walking, seizures Rash, fever, and swollen  lymph nodes Redness, blistering, peeling, or loosening of the skin, including inside the mouth Sudden or severe stomach pain, bloody diarrhea, fever, nausea, vomiting Side effects that usually do not require medical attention (report these to your care team if they continue or are bothersome): Bone, joint, or muscle pain Diarrhea Fatigue Loss of appetite Nausea Skin rash This list may not describe all possible side effects. Call your doctor for medical advice about side effects. You may report side effects to FDA at 1-800-FDA-1088. Where should I keep my medication? This medication is given in a hospital or clinic. It will not be stored at home. NOTE: This sheet is a summary. It may not cover all possible information. If you have questions about this medicine, talk to your doctor, pharmacist, or health care provider.  2023 Elsevier/Gold Standard (2021-11-30 00:00:00)

## 2022-08-13 ENCOUNTER — Encounter: Payer: Self-pay | Admitting: Internal Medicine

## 2022-08-13 LAB — T4: T4, Total: 4.8 ug/dL (ref 4.5–12.0)

## 2022-08-13 NOTE — Progress Notes (Signed)
[place] CONSULT NOTE  Patient Care Team: [name], Ricki Rodriguez as PCP - General (Physician Assistant) Alena Bills, [facility] as Pharmacist (Pharmacist) Telford Nab, RN as Oncology Nurse Navigator   CANCER STAGING   Cancer Staging  Squamous cell lung cancer [facility]) Staging form: Lung, AJCC 7th Edition - Clinical: Stage IIIA (T4, N0, M0) - Signed by [name], MD on 06/03/2022  CURRENT TREATMENT- Weekly Carboplatin and Taxol with RT completed on 07/27/2022.  Maintenance Durvalumab started 08/13/2022. Plan total 12 cycles.   ASSESSMENT & PLAN:  BREWER HITCHMAN 71 y.o. 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  #RUL of lung SCCa, atleast Stage IIIA (cT4N0M0) #Encounter for antineoplastic chemotherapy -PET CT scan was personally reviewed.  There is a large 4 cm mass in the right upper lobe partially obstructing the right upper lobe bronchus, involving the hilum and subcarinal space with postobstructive pneumonia. No lymphadenopathy.   -s/p bronchoscopy with EBUS by Dr. Patsey Berthold on 05/12/2022. -MRI brain on 05/22/2022 personally reviewed.  No evidence of metastatic disease in the brain. Small enhancing foci in the right parietal and right occipital calvarium, which are indeterminate but could represent small osseous metastases. Attention on follow-up. - not a surgical candidate  -Completed 5 cycles of CarboTaxol and RT on 07/27/2022.  Cycle 5 required dose reduction and Zarxio support.  Cycle 6 was canceled due to persistent neutropenia.  Labs reviewed.  Patient continues to be neutropenic although improved. Labs reviewed. WBC 2.0 with ANC 0.8. Patient is feeling good. Risk for worsening neutropenia with immunotherapy is low. Will proceed with C1 durvalumab. Pt cannot come  tomorrow for Zarxio. Will schedule next Thursday and if [place] still below 1000 will consider zarxio due to prolonged neutropenia.   #Chemotherapy and radiation induced neutropenia -Plan as above  #Mild anemia  -Likely related to chemotherapy.  We will continue to monitor.  # Access - port placed on 05/28/2022   No orders of the defined types were placed in this encounter.  RTC in 1 week for labs, possible zarxio 3 weeks for md visit, labs, durva  The total time spent in the appointment was 30 minutes encounter with patients including review of chart and various tests results, discussions about plan of care and coordination of care plan   All questions were answered. The patient knows to call the clinic with any problems, questions or concerns. No barriers to learning was detected.  [name], MD 12/22/20234:27 PM   HISTORY OF PRESENTING ILLNESS:  Derek Webb 71 y.o. 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  Patient was seen today accompanied by her Sister [name].  Denies any cough, hemoptysis, shortness of breath and weight loss. Remote smoker.  Quit in 2013. Has family history of lung cancer in father.  INTERVAL HISTORY-  Patient seen today prior to cycle 1 of durvalumab. Patient reports feeling well. Patient denies fever, chills, nausea, vomiting, shortness of breath, cough, abdominal pain, bleeding, bowel or bladder issues. Energy level is good.  Appetite is good.  Denies any weight loss.   I have reviewed his chart and materials related to his cancer extensively and  collaborated history with the patient. Summary of oncologic history is as follows: Oncology History Overview Note  History of left upper lobe lung SCC status post microwave thermal ablation in 2013 by  Dr. Kathlene Cote.  He was deemed not a surgical candidate and had transportation issues for radiation treatment.   Squamous cell lung cancer ([place])  01/12/2022 Initial Diagnosis   Patient has been following with Dr. Humphrey Rolls of pulmonary for suspicious lung nodule. CT chest in March 2021 showed post ablation scarring in LUL not changed and increased atelectasis of RML and RUL. He did not follow through.   CT chest in 01/12/2022 showed  New abrupt and slightly irregular cut off of the right upper lobe bronchus with associated right upper lobe atelectasis and postobstructive changes. Findings are concerning for endobronchial malignancy    04/08/2022 PET scan   IMPRESSION: 1. Hypermetabolic central right upper lobe lung mass nearly occluding the right upper lobe bronchus, poorly delineated on the noncontrast CT images, measuring approximately 3.8 x 2.9 cm, compatible with primary bronchogenic malignancy. 2. Postobstructive pneumonia/atelectasis throughout the basilar right upper lobe. 3. No hypermetabolic metastatic thoracic adenopathy or distant metastatic disease. 4. Stable mild post ablation change in the left upper lobe with no metabolic evidence of local tumor recurrence in this location. 5. Dilated 4.4 cm ascending thoracic aorta. Recommend annual imaging followup by CTA or MRA   05/12/2022 Procedure   Procedure was delayed due to miscommunication about holding Plavix. S/p bronchoscopy with EBUS by Dr. Patsey Berthold. Op note mentions significant bulky adenopathy in the subcarinal space and in the right hilum.    05/12/2022 Pathology Results   DIAGNOSIS:  A.  LUNG, RIGHT UPPER LOBE; ENB-ASSISTED BIOPSY:  - SQUAMOUS CELL CARCINOMA.   LUNG, RIGHT UPPER LOBE; EBUS-ASSISTED BRUSHING:  - POSITIVE FOR MALIGNANCY.  - SQUAMOUS CELL CARCINOMA IS PRESENT.   LUNG, RIGHT UPPER LOBE; EBUS-ASSISTED LAVAGE:  - POSITIVE FOR MALIGNANCY.  - SQUAMOUS CELL CARCINOMA IS PRESENT  SUBCARINAL SPACE, RIGHT;  EBUS-ASSISTED FNA:  - POSITIVE FOR MALIGNANCY.  - SQUAMOUS CELL CARCINOMA IS PRESENT   05/18/2022 Cancer Staging   Staging form: Lung, AJCC 7th Edition - Clinical: Stage IIIA (T4, N0, M0) - Signed by [name], MD on 06/03/2022   05/21/2022 Imaging   MRI brain  No evidence of metastatic disease in the brain. 2. Small enhancing foci in the right parietal and right occipital calvarium, which are indeterminate but could represent small osseous metastases. Attention on follow-up   06/03/2022 - 07/27/2022 Chemotherapy   Patient is on Treatment Plan : LUNG Carboplatin + Paclitaxel + XRT q7d      08/03/2022 -  Chemotherapy   Patient is on Treatment Plan : LUNG NSCLC Durvalumab (1500) q28d        MEDICAL HISTORY:  Past Medical History:  Diagnosis Date   Alcoholism ([place])    COPD (chronic obstructive pulmonary disease) (HCC)    SEVERE COPD -OCCASIONALLY USES OXYGEN AT NIGHT-DOES NOT USE INHALERS ON REGULAR BASIS   Elevated cholesterol    GERD (gastroesophageal reflux disease)    HOH (hard of hearing)    Hypertension    Lung cancer ([place])    LEFT UPPER LUNG CARCINOMA-NOT A CANDIDATE FOR SURGICAL RESECTION BECAUSE OF HIS COPD AND POOR CANDIDATE FOR RADIATION BECAUSE OF TRANSPORTATION PROBLEMS   Seizures ([place])    CHRONIC DILATIN - PT STATES NO SEIZURES IN PAST COUPLE OF YEARS; PT STATES HIS SEIZURES CAUSED NUMBNESS OF ARMS AND HANDS AND NOT ABLE TO MOVE HIS ARMS  Stroke ([place])    2 TO 3 YRS AGO-AFFECTED HIS SPEECH--ABLE TO AMBULATE WITHOUT ASSIST AND DOES YARD WORK.  DECREASED HEARING IN BOTH EARS SINCE STROKE.   Vitamin D deficiency     SURGICAL HISTORY: Past Surgical History:  Procedure Laterality Date   CAROTID SURGERY 2009 -LEFT     COLON POLYP EXCISION     COLON SURGERY     COLONOSCOPY WITH PROPOFOL N/A 04/18/2017   Procedure: COLONOSCOPY WITH PROPOFOL;  Surgeon: Manya Silvas, MD;  Location: [facility] ENDOSCOPY;  Service: Endoscopy;  Laterality: N/A;    ESOPHAGOGASTRODUODENOSCOPY     IR GENERIC HISTORICAL  01/21/2016   IR RADIOLOGIST EVAL & MGMT 01/21/2016 Aletta Edouard, MD [facility] INTERV RAD   IR GENERIC HISTORICAL  10/22/2014   IR RADIOLOGIST EVAL & MGMT 10/22/2014 Aletta Edouard, MD [facility] INTERV RAD   IR IMAGING GUIDED PORT INSERTION  05/28/2022   IR RADIOLOGIST EVAL & MGMT  02/02/2017   IR RADIOLOGIST EVAL & MGMT  03/08/2018   percutaneous biopsy     VIDEO BRONCHOSCOPY WITH ENDOBRONCHIAL ULTRASOUND Right 05/12/2022   Procedure: VIDEO BRONCHOSCOPY WITH ENDOBRONCHIAL ULTRASOUND;  Surgeon: Tyler Pita, MD;  Location: [facility] ORS;  Service: Cardiopulmonary;  Laterality: Right;    SOCIAL HISTORY: Social History   Socioeconomic History   Marital status: Single    Spouse name: Not on file   Number of children: Not on file   Years of education: Not on file   Highest education level: Not on file  Occupational History   Not on file  Tobacco Use   Smoking status: Former    Packs/day: 1.00    Years: 35.00    Total pack years: 35.00    Types: Cigarettes    Quit date: 12/22/2011    Years since quitting: 10.6   Smokeless tobacco: Never  Vaping Use   Vaping Use: Never used  Substance and Sexual Activity   Alcohol use: Yes    Comment: occosionally    Drug use: No   Sexual activity: Not on file  Other Topics Concern   Not on file  Social History Narrative   Not on file   Social Determinants of Health   Financial Resource Strain: Low Risk  (11/28/2020)   Overall Financial Resource Strain (CARDIA)    Difficulty of Paying Living Expenses: Not hard at all  Food Insecurity: No Food Insecurity (06/15/2022)   Hunger Vital Sign    Worried About Running Out of Food in the Last Year: Never true    [place] in the Last Year: Never true  Transportation Needs: No Transportation Needs (06/15/2022)   PRAPARE - Hydrologist (Medical): No    Lack of Transportation (Non-Medical): No  Physical Activity: Inactive  (06/15/2022)   Exercise Vital Sign    Days of Exercise per Week: 0 days    Minutes of Exercise per Session: 0 min  Stress: Stress Concern Present (06/15/2022)   [place]    Feeling of Stress : To some extent  Social Connections: Socially Isolated (06/15/2022)   Social Connection and Isolation Panel [NHANES]    Frequency of Communication with Friends and Family: More than three times a week    Frequency of Social Gatherings with Friends and Family: More than three times a week    Attends Religious Services: Never    Marine scientist or Organizations: No    Attends Archivist  Meetings: Never    Marital Status: Never married  Intimate Partner Violence: Unknown (06/15/2022)   Humiliation, Afraid, Rape, and Kick questionnaire    Fear of Current or Ex-Partner: No    Emotionally Abused: Not on file    Physically Abused: No    Sexually Abused: No    FAMILY HISTORY: Family History  Problem Relation Age of Onset   Colon polyps Sister     ALLERGIES:  has No Known Allergies.  MEDICATIONS:  Current Outpatient Medications  Medication Sig Dispense Refill   aspirin 81 MG tablet Take 81 mg by mouth daily.     atorvastatin (LIPITOR) 80 MG tablet Take 1 tablet (80 mg total) by mouth daily. 90 tablet 3   budesonide-formoterol (SYMBICORT) 160-4.5 MCG/ACT inhaler Inhale 1 puff into the lungs 2 (two) times daily. 1 each 5   Cholecalciferol (VITAMIN D) 2000 UNITS CAPS Take by mouth. VITAMIN D 3 2000 IU  ONE SOFTGEL DAILY  --OTC     clopidogrel (PLAVIX) 75 MG tablet Take 1 tablet (75 mg total) by mouth daily. 90 tablet 3   enalapril (VASOTEC) 20 MG tablet Take 1 tablet (20 mg total) by mouth daily. 90 tablet 3   ezetimibe (ZETIA) 10 MG tablet Take 1 tablet (10 mg total) by mouth daily. 90 tablet 3   Na Sulfate-K Sulfate-Mg Sulf 17.5-3.13-1.6 GM/177ML SOLN Take by mouth.     OXYGEN Inhale into the lungs. At night      pantoprazole (PROTONIX) 40 MG tablet Take 1 tablet (40 mg total) by mouth daily. IN AM 90 tablet 0   phenytoin (DILANTIN) 100 MG ER capsule Take 4 capsules (400 mg total) by mouth at bedtime. 450 capsule 1   No current facility-administered medications for this visit.    REVIEW OF SYSTEMS:   Pertinent information mentioned in HPI All other systems were reviewed with the patient and are negative.  PHYSICAL EXAMINATION: ECOG PERFORMANCE STATUS: 2 - Symptomatic, <50% confined to bed  Vitals:   08/12/22 0948  BP: 121/85  Pulse: 65  Resp: 18  Temp: (!) 97.2 F (36.2 C)  SpO2: 100%    There were no vitals filed for this visit.    GENERAL:alert, no distress and comfortable SKIN: skin color, texture, turgor are normal, no rashes or significant lesions EYES: normal, conjunctiva are pink and non-injected, sclera clear OROPHARYNX:no exudate, no erythema and lips, buccal mucosa, and tongue normal  NECK: supple, thyroid normal size, non-tender, without nodularity LYMPH:  no palpable lymphadenopathy in the cervical, axillary or inguinal LUNGS: clear to auscultation and percussion with normal breathing effort HEART: regular rate & rhythm and no murmurs and no lower extremity edema ABDOMEN:abdomen soft, non-tender and normal bowel sounds Musculoskeletal:no cyanosis of digits and no clubbing  PSYCH: alert & oriented x 3 with fluent speech NEURO: no focal motor/sensory deficits  LABORATORY DATA:  I have reviewed the data as listed Lab Results  Component Value Date   WBC 2.0 (L) 08/12/2022   HGB 12.4 (L) 08/12/2022   HCT 35.7 (L) 08/12/2022   MCV 90.2 08/12/2022   PLT 352 08/12/2022   Recent Labs    07/26/22 1002 08/03/22 0939 08/12/22 0936  NA 139 138 138  K 4.2 3.9 4.1  CL 103 105 103  CO2 29 25 28   GLUCOSE 104* 87 104*  BUN 8 9 7*  CREATININE 0.58* 0.47* 0.56*  CALCIUM 9.1 8.6* 9.0  GFRNONAA >60 >60 >60  PROT 7.5 7.1 7.3  ALBUMIN 3.8 3.5 3.8  AST 17 17 20   ALT 16 16  19   ALKPHOS 87 83 87  BILITOT 0.6 0.6 0.5    RADIOGRAPHIC STUDIES: I have personally reviewed the radiological images as listed and agreed with the findings in the report. No results found.

## 2022-08-19 ENCOUNTER — Ambulatory Visit: Payer: Medicare Other | Admitting: Physician Assistant

## 2022-08-19 ENCOUNTER — Other Ambulatory Visit: Payer: Medicare Other

## 2022-08-19 ENCOUNTER — Ambulatory Visit: Payer: Medicare Other

## 2022-08-19 ENCOUNTER — Inpatient Hospital Stay: Payer: Medicare Other

## 2022-08-19 DIAGNOSIS — D649 Anemia, unspecified: Secondary | ICD-10-CM | POA: Diagnosis not present

## 2022-08-19 DIAGNOSIS — I10 Essential (primary) hypertension: Secondary | ICD-10-CM | POA: Diagnosis not present

## 2022-08-19 DIAGNOSIS — C3490 Malignant neoplasm of unspecified part of unspecified bronchus or lung: Secondary | ICD-10-CM

## 2022-08-19 DIAGNOSIS — Z5112 Encounter for antineoplastic immunotherapy: Secondary | ICD-10-CM | POA: Diagnosis not present

## 2022-08-19 DIAGNOSIS — Z79899 Other long term (current) drug therapy: Secondary | ICD-10-CM | POA: Diagnosis not present

## 2022-08-19 DIAGNOSIS — Z452 Encounter for adjustment and management of vascular access device: Secondary | ICD-10-CM | POA: Diagnosis not present

## 2022-08-19 DIAGNOSIS — C3491 Malignant neoplasm of unspecified part of right bronchus or lung: Secondary | ICD-10-CM

## 2022-08-19 DIAGNOSIS — Z95828 Presence of other vascular implants and grafts: Secondary | ICD-10-CM

## 2022-08-19 DIAGNOSIS — C3411 Malignant neoplasm of upper lobe, right bronchus or lung: Secondary | ICD-10-CM | POA: Diagnosis not present

## 2022-08-19 DIAGNOSIS — D701 Agranulocytosis secondary to cancer chemotherapy: Secondary | ICD-10-CM | POA: Diagnosis not present

## 2022-08-19 LAB — COMPREHENSIVE METABOLIC PANEL
ALT: 20 U/L (ref 0–44)
AST: 17 U/L (ref 15–41)
Albumin: 3.8 g/dL (ref 3.5–5.0)
Alkaline Phosphatase: 90 U/L (ref 38–126)
Anion gap: 6 (ref 5–15)
BUN: 11 mg/dL (ref 8–23)
CO2: 27 mmol/L (ref 22–32)
Calcium: 9 mg/dL (ref 8.9–10.3)
Chloride: 105 mmol/L (ref 98–111)
Creatinine, Ser: 0.47 mg/dL — ABNORMAL LOW (ref 0.61–1.24)
GFR, Estimated: >60 mL/min (ref >60)
Glucose, Bld: 94 mg/dL (ref 70–99)
Potassium: 3.9 mmol/L (ref 3.5–5.1)
Sodium: 138 mmol/L (ref 135–145)
Total Bilirubin: 0.3 mg/dL (ref 0.3–1.2)
Total Protein: 7.3 g/dL (ref 6.5–8.1)

## 2022-08-19 LAB — CBC WITH DIFFERENTIAL/PLATELET
Abs Immature Granulocytes: 0.01 10*3/uL (ref 0.00–0.07)
Basophils Absolute: 0 10*3/uL (ref 0.0–0.1)
Basophils Relative: 1 %
Eosinophils Absolute: 0.1 10*3/uL (ref 0.0–0.5)
Eosinophils Relative: 2 %
HCT: 34.9 % — ABNORMAL LOW (ref 39.0–52.0)
Hemoglobin: 12.4 g/dL — ABNORMAL LOW (ref 13.0–17.0)
Immature Granulocytes: 0 %
Lymphocytes Relative: 26 %
Lymphs Abs: 0.7 10*3/uL (ref 0.7–4.0)
MCH: 31.9 pg (ref 26.0–34.0)
MCHC: 35.5 g/dL (ref 30.0–36.0)
MCV: 89.7 fL (ref 80.0–100.0)
Monocytes Absolute: 0.5 10*3/uL (ref 0.1–1.0)
Monocytes Relative: 17 %
Neutro Abs: 1.5 10*3/uL — ABNORMAL LOW (ref 1.7–7.7)
Neutrophils Relative %: 54 %
Platelets: 345 10*3/uL (ref 150–400)
RBC: 3.89 MIL/uL — ABNORMAL LOW (ref 4.22–5.81)
RDW: 15 % (ref 11.5–15.5)
WBC: 2.7 10*3/uL — ABNORMAL LOW (ref 4.0–10.5)
nRBC: 0 % (ref 0.0–0.2)

## 2022-08-19 MED ORDER — SODIUM CHLORIDE 0.9% FLUSH
10.0000 mL | Freq: Once | INTRAVENOUS | Status: AC
  Administered 2022-08-19: 10 mL via INTRAVENOUS
  Filled 2022-08-19: qty 10

## 2022-08-19 MED ORDER — FILGRASTIM-SNDZ 480 MCG/0.8ML IJ SOSY
480.0000 ug | PREFILLED_SYRINGE | Freq: Once | INTRAMUSCULAR | Status: AC
  Administered 2022-08-19: 480 ug via SUBCUTANEOUS
  Filled 2022-08-19: qty 0.8

## 2022-08-19 MED ORDER — HEPARIN SOD (PORK) LOCK FLUSH 100 UNIT/ML IV SOLN
500.0000 [IU] | Freq: Once | INTRAVENOUS | Status: AC
  Administered 2022-08-19: 500 [IU] via INTRAVENOUS
  Filled 2022-08-19: qty 5

## 2022-08-21 NOTE — Anesthesia Postprocedure Evaluation (Signed)
Anesthesia Post Note  Patient: Derek Webb  Procedure(s) Performed: VIDEO BRONCHOSCOPY WITH ENDOBRONCHIAL ULTRASOUND (Right)  Patient location during evaluation: PACU Anesthesia Type: General Level of consciousness: awake and alert Pain management: pain level controlled Vital Signs Assessment: post-procedure vital signs reviewed and stable Respiratory status: spontaneous breathing, nonlabored ventilation, respiratory function stable and patient connected to nasal cannula oxygen Cardiovascular status: blood pressure returned to baseline and stable Postop Assessment: no apparent nausea or vomiting Anesthetic complications: no   No notable events documented.   Last Vitals:  Vitals:   05/12/22 1430 05/12/22 1446  BP: 124/82 (!) 156/87  Pulse: 75 73  Resp: 17 18  Temp: (!) 36.1 C 36.7 C  SpO2: 91% 94%    Last Pain:  Vitals:   05/12/22 1446  TempSrc: Temporal  PainSc: 0-No pain                 Tonny Bollman

## 2022-08-23 DIAGNOSIS — J449 Chronic obstructive pulmonary disease, unspecified: Secondary | ICD-10-CM | POA: Diagnosis not present

## 2022-08-30 ENCOUNTER — Ambulatory Visit: Payer: Medicare Other

## 2022-08-30 ENCOUNTER — Ambulatory Visit: Payer: Medicare Other | Admitting: Internal Medicine

## 2022-08-30 ENCOUNTER — Encounter: Payer: Self-pay | Admitting: Internal Medicine

## 2022-08-30 ENCOUNTER — Other Ambulatory Visit: Payer: Medicare Other

## 2022-09-02 ENCOUNTER — Ambulatory Visit
Admission: RE | Admit: 2022-09-02 | Discharge: 2022-09-02 | Disposition: A | Discharge status: Home / Self Care | Payer: 59 | Source: Ambulatory Visit | Attending: Radiation Oncology | Admitting: Radiation Oncology

## 2022-09-02 ENCOUNTER — Encounter: Payer: Self-pay | Admitting: Physician Assistant

## 2022-09-02 ENCOUNTER — Ambulatory Visit (INDEPENDENT_AMBULATORY_CARE_PROVIDER_SITE_OTHER): Payer: 59 | Admitting: Physician Assistant

## 2022-09-02 ENCOUNTER — Other Ambulatory Visit: Payer: Self-pay | Admitting: *Deleted

## 2022-09-02 ENCOUNTER — Encounter: Payer: Self-pay | Admitting: Radiation Oncology

## 2022-09-02 VITALS — BP 120/80 | HR 70 | Temp 97.8°F | Resp 16 | Ht 67.0 in | Wt 172.0 lb

## 2022-09-02 VITALS — BP 122/80 | HR 63 | Temp 98.4°F | Resp 12 | Wt 170.0 lb

## 2022-09-02 DIAGNOSIS — Z0001 Encounter for general adult medical examination with abnormal findings: Secondary | ICD-10-CM | POA: Diagnosis not present

## 2022-09-02 DIAGNOSIS — C3490 Malignant neoplasm of unspecified part of unspecified bronchus or lung: Secondary | ICD-10-CM

## 2022-09-02 DIAGNOSIS — M7989 Other specified soft tissue disorders: Secondary | ICD-10-CM | POA: Diagnosis not present

## 2022-09-02 DIAGNOSIS — Z8669 Personal history of other diseases of the nervous system and sense organs: Secondary | ICD-10-CM | POA: Diagnosis not present

## 2022-09-02 DIAGNOSIS — E538 Deficiency of other specified B group vitamins: Secondary | ICD-10-CM | POA: Diagnosis not present

## 2022-09-02 DIAGNOSIS — C3492 Malignant neoplasm of unspecified part of left bronchus or lung: Secondary | ICD-10-CM | POA: Diagnosis not present

## 2022-09-02 NOTE — Progress Notes (Signed)
[facility] [address] [place], [place] [address]  Internal MEDICINE  Office Visit Note  Patient Name: Derek Webb  [id number]  [id number]  Date of Service: 09/02/2022  Chief Complaint  Patient presents with   Medicare Wellness   Hyperlipidemia   Hypertension   Gastroesophageal Reflux     HPI Pt is here for routine health maintenance examination -He is followed by pulmonology and now oncology for [facility] of lung. He states he has an oncology visit later today. His labs are being closely monitored by their office, however does nee b12 and phenytoin level rechecked and will order these today. -He has no concerns or complaints today. States no problems with his breathing or energy. States he didn't notice any difference since taking B12 shots. Will hold this today and check labs -On exam did notice left side of neck soft tissue mass. Currently measures 2cmx2cm and is superficial without signs of infection. States this has been present for years and has fluctuated bigger previously and then went back down a while ago. It is not bothersome at all. Does not interfere with swallowing or cause any pain. He did have a Pet scan in August 2023 that did not show any concern for malignancy in this area and given timeline and appearance it is likely a cyst vs lipoma and will monitor. Dr. Freda Munro, his pulmonologist who ordered PET scan previously was brought in to look at this as well and agreed with plan to monitor. If bothersome or changing may need scan/biopsy. He may also discuss with his oncologist today so that it can be monitored at his frequent visits to their office. -he was due for colonoscopy and had this scheduled last year, however this was cancelled due health changes and lung finding and may consider rescheduling in future.  Current Medication: Outpatient Encounter Medications as of 09/02/2022  Medication Sig   aspirin 81 MG tablet Take 81 mg by mouth daily.    atorvastatin (LIPITOR) 80 MG tablet Take 1 tablet (80 mg total) by mouth daily.   budesonide-formoterol (SYMBICORT) 160-4.5 MCG/ACT inhaler Inhale 1 puff into the lungs 2 (two) times daily.   Cholecalciferol (VITAMIN D) 2000 UNITS CAPS Take by mouth. VITAMIN D 3 2000 IU  ONE SOFTGEL DAILY  --OTC   clopidogrel (PLAVIX) 75 MG tablet Take 1 tablet (75 mg total) by mouth daily.   enalapril (VASOTEC) 20 MG tablet Take 1 tablet (20 mg total) by mouth daily.   ezetimibe (ZETIA) 10 MG tablet Take 1 tablet (10 mg total) by mouth daily.   Na Sulfate-K Sulfate-Mg Sulf 17.5-3.13-1.6 GM/177ML SOLN Take by mouth.   OXYGEN Inhale into the lungs. At night   pantoprazole (PROTONIX) 40 MG tablet Take 1 tablet (40 mg total) by mouth daily. IN AM   phenytoin (DILANTIN) 100 MG ER capsule Take 4 capsules (400 mg total) by mouth at bedtime.   No facility-administered encounter medications on file as of 09/02/2022.    Surgical History: Past Surgical History:  Procedure Laterality Date   CAROTID SURGERY 2009 -LEFT     COLON POLYP EXCISION     COLON SURGERY     COLONOSCOPY WITH PROPOFOL N/A 04/18/2017   Procedure: COLONOSCOPY WITH PROPOFOL;  Surgeon: Scot Jun, MD;  Location: [facility] ENDOSCOPY;  Service: Endoscopy;  Laterality: N/A;   ESOPHAGOGASTRODUODENOSCOPY     IR GENERIC HISTORICAL  01/21/2016   IR RADIOLOGIST EVAL & MGMT 01/21/2016 Irish Lack, MD [facility] INTERV RAD   IR [facility]  HISTORICAL  10/22/2014   IR RADIOLOGIST EVAL & MGMT 10/22/2014 Irish Lack, MD [facility] INTERV RAD   IR IMAGING GUIDED PORT INSERTION  05/28/2022   IR RADIOLOGIST EVAL & MGMT  02/02/2017   IR RADIOLOGIST EVAL & MGMT  03/08/2018   percutaneous biopsy     VIDEO BRONCHOSCOPY WITH ENDOBRONCHIAL ULTRASOUND Right 05/12/2022   Procedure: VIDEO BRONCHOSCOPY WITH ENDOBRONCHIAL ULTRASOUND;  Surgeon: Salena Saner, MD;  Location: [facility] ORS;  Service: Cardiopulmonary;  Laterality: Right;    Medical History: Past Medical History:   Diagnosis Date   Alcoholism (HCC)    COPD (chronic obstructive pulmonary disease) (HCC)    SEVERE COPD -OCCASIONALLY USES OXYGEN AT NIGHT-DOES NOT USE INHALERS ON REGULAR BASIS   Elevated cholesterol    GERD (gastroesophageal reflux disease)    HOH (hard of hearing)    Hypertension    Lung cancer (HCC)    LEFT UPPER LUNG CARCINOMA-NOT A CANDIDATE FOR SURGICAL RESECTION BECAUSE OF HIS COPD AND POOR CANDIDATE FOR RADIATION BECAUSE OF TRANSPORTATION PROBLEMS   Seizures (HCC)    CHRONIC DILATIN - PT STATES NO SEIZURES IN PAST COUPLE OF YEARS; PT STATES HIS SEIZURES CAUSED NUMBNESS OF ARMS AND HANDS AND NOT ABLE TO MOVE HIS ARMS   Stroke (HCC)    2 TO 3 YRS AGO-AFFECTED HIS SPEECH--ABLE TO AMBULATE WITHOUT ASSIST AND DOES YARD WORK.  DECREASED HEARING IN BOTH EARS SINCE STROKE.   Vitamin D deficiency     Family History: Family History  Problem Relation Age of Onset   Colon polyps Sister       Review of Systems  Constitutional:  Negative for chills, fatigue and unexpected weight change.  HENT:  Negative for congestion, postnasal drip, rhinorrhea, sneezing and sore throat.   Eyes:  Negative for redness.  Respiratory:  Negative for cough, chest tightness and shortness of breath.   Cardiovascular:  Negative for chest pain and palpitations.  Gastrointestinal:  Negative for abdominal pain, constipation, diarrhea, nausea and vomiting.  Genitourinary:  Negative for dysuria and frequency.  Musculoskeletal:  Negative for arthralgias, back pain, joint swelling and neck pain.  Skin:  Negative for rash.  Neurological: Negative.  Negative for tremors and numbness.  Hematological:  Negative for adenopathy. Does not bruise/bleed easily.  Psychiatric/Behavioral:  Negative for behavioral problems (Depression), sleep disturbance and suicidal ideas. The patient is not nervous/anxious.      Vital Signs: BP 120/80   Pulse 70   Temp 97.8 F (36.6 C)   Resp 16   Ht 5\' 7"  (1.702 m)   Wt 172 lb  (78 kg)   SpO2 95%   BMI 26.94 kg/m    Physical Exam Vitals and nursing note reviewed.  Constitutional:      General: He is not in acute distress.    Appearance: He is well-developed. He is not diaphoretic.  HENT:     Head: Normocephalic and atraumatic.     Mouth/Throat:     Pharynx: No oropharyngeal exudate.  Eyes:     Pupils: Pupils are equal, round, and reactive to light.  Neck:     Thyroid: No thyromegaly.     Vascular: No JVD.     Trachea: No tracheal deviation.   Cardiovascular:     Rate and Rhythm: Normal rate and regular rhythm.     Heart sounds: Normal heart sounds. No murmur heard.    No friction rub. No gallop.  Pulmonary:     Effort: Pulmonary effort is normal. No respiratory distress.  Breath sounds: No wheezing or rales.  Chest:     Chest wall: No tenderness.  Abdominal:     General: Bowel sounds are normal.     Palpations: Abdomen is soft.     Tenderness: There is no abdominal tenderness.  Musculoskeletal:        General: Normal range of motion.     Cervical back: Normal range of motion and neck supple.  Lymphadenopathy:     Cervical: No cervical adenopathy.  Skin:    General: Skin is warm and dry.  Neurological:     Mental Status: He is alert and oriented to person, place, and time.     Cranial Nerves: No cranial nerve deficit.  Psychiatric:        Behavior: Behavior normal.        Thought Content: Thought content normal.        Judgment: Judgment normal.      LABS: Recent Results (from the past 2160 hour(s))  Rad Onc Aria Session Summary     Status: None   Collection Time: 06/08/22 11:46 AM  Result Value Ref Range   Course ID C1_Chest    Course Intent Unknown    Course Start Date 05/25/2022 11:51 AM    Session Number 3    Course First Treatment Date 06/03/2022  1:26 PM    Course Last Treatment Date 06/08/2022 11:44 AM    Course Elapsed Days 5    Reference Point ID Lung_R DP    Reference Point Dosage Given to Date 6 Gy   Reference  Point Session Dosage Given 2 Gy   Plan ID Lung_R_IMRT    Plan Name Chest_Rt    Plan Fractions Treated to Date 3    Plan Total Fractions Prescribed 35    Plan Prescribed Dose Per Fraction 2 Gy   Plan Total Prescribed Dose 70.000000 Gy   Plan Primary Reference Point Lung_R DP   Rad Onc Aria Session Summary     Status: None   Collection Time: 06/09/22 11:53 AM  Result Value Ref Range   Course ID C1_Chest    Course Intent Unknown    Course Start Date 05/25/2022 11:51 AM    Session Number 4    Course First Treatment Date 06/03/2022  1:26 PM    Course Last Treatment Date 06/09/2022 11:51 AM    Course Elapsed Days 6    Reference Point ID Lung_R DP    Reference Point Dosage Given to Date 8 Gy   Reference Point Session Dosage Given 2 Gy   Plan ID Lung_R_IMRT    Plan Name Chest_Rt    Plan Fractions Treated to Date 4    Plan Total Fractions Prescribed 35    Plan Prescribed Dose Per Fraction 2 Gy   Plan Total Prescribed Dose 70.000000 Gy   Plan Primary Reference Point Lung_R DP   Comprehensive metabolic panel     Status: Abnormal   Collection Time: 06/10/22  9:59 AM  Result Value Ref Range   Sodium 134 (L) 135 - 145 mmol/L   Potassium 3.6 3.5 - 5.1 mmol/L   Chloride 97 (L) 98 - 111 mmol/L   CO2 28 22 - 32 mmol/L   Glucose, Bld 106 (H) 70 - 99 mg/dL    Comment: Glucose reference range applies only to samples taken after fasting for at least 8 hours.   BUN 8 8 - 23 mg/dL   Creatinine, Ser 0.58 (L) 0.61 - 1.24 mg/dL   Calcium 9.0 8.9 -  10.3 mg/dL   Total Protein 7.1 6.5 - 8.1 g/dL   Albumin 3.4 (L) 3.5 - 5.0 g/dL   AST 15 15 - 41 U/L   ALT 10 0 - 44 U/L   Alkaline Phosphatase 110 38 - 126 U/L   Total Bilirubin 0.5 0.3 - 1.2 mg/dL   GFR, Estimated >40 >81 mL/min    Comment: (NOTE) Calculated using the CKD-EPI Creatinine Equation (2021)    Anion gap 9 5 - 15    Comment: Performed at [facility], [address] [place], [place] [address]  CBC with Differential     Status:  Abnormal   Collection Time: 06/10/22  9:59 AM  Result Value Ref Range   WBC 4.6 4.0 - 10.5 K/uL   RBC 3.71 (L) 4.22 - 5.81 MIL/uL   Hemoglobin 11.4 (L) 13.0 - 17.0 g/dL   HCT 62.0 (L) 49.4 - 37.7 %   MCV 90.3 80.0 - 100.0 fL   MCH 30.7 26.0 - 34.0 pg   MCHC 34.0 30.0 - 36.0 g/dL   RDW 65.4 80.6 - 36.6 %   Platelets 363 150 - 400 K/uL   nRBC 0.0 0.0 - 0.2 %   Neutrophils Relative % 60 %   Neutro Abs 2.8 1.7 - 7.7 K/uL   Lymphocytes Relative 21 %   Lymphs Abs 1.0 0.7 - 4.0 K/uL   Monocytes Relative 14 %   Monocytes Absolute 0.6 0.1 - 1.0 K/uL   Eosinophils Relative 3 %   Eosinophils Absolute 0.1 0.0 - 0.5 K/uL   Basophils Relative 1 %   Basophils Absolute 0.1 0.0 - 0.1 K/uL   Immature Granulocytes 1 %   Abs Immature Granulocytes 0.03 0.00 - 0.07 K/uL    Comment: Performed at [facility], [address] [place], [place] [address]  Rad Jeralene Huff Session Summary     Status: None   Collection Time: 06/10/22 10:58 AM  Result Value Ref Range   Course ID C1_Chest    Course Intent Unknown    Course Start Date 05/25/2022 11:51 AM    Session Number 5    Course First Treatment Date 06/03/2022  1:26 PM    Course Last Treatment Date 06/10/2022 10:56 AM    Course Elapsed Days 7    Reference Point ID Lung_R DP    Reference Point Dosage Given to Date 10 Gy   Reference Point Session Dosage Given 2 Gy   Plan ID Lung_R_IMRT    Plan Name Chest_Rt    Plan Fractions Treated to Date 5    Plan Total Fractions Prescribed 35    Plan Prescribed Dose Per Fraction 2 Gy   Plan Total Prescribed Dose 70.000000 Gy   Plan Primary Reference Point Lung_R DP   Rad Onc Aria Session Summary     Status: None   Collection Time: 06/11/22 10:55 AM  Result Value Ref Range   Course ID C1_Chest    Course Intent Unknown    Course Start Date 05/25/2022 11:51 AM    Session Number 6    Course First Treatment Date 06/03/2022  1:26 PM    Course Last Treatment Date 06/11/2022 10:54 AM    Course Elapsed Days 8     Reference Point ID Lung_R DP    Reference Point Dosage Given to Date 12 Gy   Reference Point Session Dosage Given 2 Gy   Plan ID Lung_R_IMRT    Plan Name Chest_Rt    Plan Fractions Treated to Date  6    Plan Total Fractions Prescribed 35    Plan Prescribed Dose Per Fraction 2 Gy   Plan Total Prescribed Dose 70.000000 Gy   Plan Primary Reference Point Lung_R DP   Rad Onc Aria Session Summary     Status: None   Collection Time: 06/14/22 10:44 AM  Result Value Ref Range   Course ID C1_Chest    Course Intent Unknown    Course Start Date 05/25/2022 11:51 AM    Session Number 7    Course First Treatment Date 06/03/2022  1:26 PM    Course Last Treatment Date 06/14/2022 10:43 AM    Course Elapsed Days 11    Reference Point ID Lung_R DP    Reference Point Dosage Given to Date 14 Gy   Reference Point Session Dosage Given 2 Gy   Plan ID Lung_R_IMRT    Plan Name Chest_Rt    Plan Fractions Treated to Date 7    Plan Total Fractions Prescribed 35    Plan Prescribed Dose Per Fraction 2 Gy   Plan Total Prescribed Dose 70.000000 Gy   Plan Primary Reference Point Lung_R DP   Rad Onc Aria Session Summary     Status: None   Collection Time: 06/15/22 10:26 AM  Result Value Ref Range   Course ID C1_Chest    Course Intent Unknown    Course Start Date 05/25/2022 11:51 AM    Session Number 8    Course First Treatment Date 06/03/2022  1:26 PM    Course Last Treatment Date 06/15/2022 10:24 AM    Course Elapsed Days 12    Reference Point ID Lung_R DP    Reference Point Dosage Given to Date 16 Gy   Reference Point Session Dosage Given 2 Gy   Plan ID Lung_R_IMRT    Plan Name Chest_Rt    Plan Fractions Treated to Date 8    Plan Total Fractions Prescribed 35    Plan Prescribed Dose Per Fraction 2 Gy   Plan Total Prescribed Dose 70.000000 Gy   Plan Primary Reference Point Lung_R DP   Rad Onc Aria Session Summary     Status: None   Collection Time: 06/16/22 10:26 AM  Result Value Ref Range   Course  ID C1_Chest    Course Intent Unknown    Course Start Date 05/25/2022 11:51 AM    Session Number 9    Course First Treatment Date 06/03/2022  1:26 PM    Course Last Treatment Date 06/16/2022 10:25 AM    Course Elapsed Days 13    Reference Point ID Lung_R DP    Reference Point Dosage Given to Date 18 Gy   Reference Point Session Dosage Given 2 Gy   Plan ID Lung_R_IMRT    Plan Name Chest_Rt    Plan Fractions Treated to Date 9    Plan Total Fractions Prescribed 35    Plan Prescribed Dose Per Fraction 2 Gy   Plan Total Prescribed Dose 70.000000 Gy   Plan Primary Reference Point Lung_R DP   Comprehensive metabolic panel     Status: Abnormal   Collection Time: 06/17/22  9:51 AM  Result Value Ref Range   Sodium 136 135 - 145 mmol/L   Potassium 3.8 3.5 - 5.1 mmol/L   Chloride 102 98 - 111 mmol/L   CO2 27 22 - 32 mmol/L   Glucose, Bld 111 (H) 70 - 99 mg/dL    Comment: Glucose reference range applies only to samples taken after fasting for  at least 8 hours.   BUN 6 (L) 8 - 23 mg/dL   Creatinine, Ser 0.49 (L) 0.61 - 1.24 mg/dL   Calcium 9.2 8.9 - 10.3 mg/dL   Total Protein 7.6 6.5 - 8.1 g/dL   Albumin 3.6 3.5 - 5.0 g/dL   AST 16 15 - 41 U/L   ALT 15 0 - 44 U/L   Alkaline Phosphatase 108 38 - 126 U/L   Total Bilirubin 0.4 0.3 - 1.2 mg/dL   GFR, Estimated >60 >60 mL/min    Comment: (NOTE) Calculated using the CKD-EPI Creatinine Equation (2021)    Anion gap 7 5 - 15    Comment: Performed at [facility], [address] [place], [place] [address]  CBC with Differential     Status: Abnormal   Collection Time: 06/17/22  9:51 AM  Result Value Ref Range   WBC 4.5 4.0 - 10.5 K/uL   RBC 3.77 (L) 4.22 - 5.81 MIL/uL   Hemoglobin 11.6 (L) 13.0 - 17.0 g/dL   HCT 34.2 (L) 39.0 - 52.0 %   MCV 90.7 80.0 - 100.0 fL   MCH 30.8 26.0 - 34.0 pg   MCHC 33.9 30.0 - 36.0 g/dL   RDW 13.3 11.5 - 15.5 %   Platelets 485 (H) 150 - 400 K/uL   nRBC 0.0 0.0 - 0.2 %   Neutrophils Relative % 65 %    Neutro Abs 3.0 1.7 - 7.7 K/uL   Lymphocytes Relative 22 %   Lymphs Abs 1.0 0.7 - 4.0 K/uL   Monocytes Relative 10 %   Monocytes Absolute 0.5 0.1 - 1.0 K/uL   Eosinophils Relative 2 %   Eosinophils Absolute 0.1 0.0 - 0.5 K/uL   Basophils Relative 1 %   Basophils Absolute 0.0 0.0 - 0.1 K/uL   Immature Granulocytes 0 %   Abs Immature Granulocytes 0.02 0.00 - 0.07 K/uL    Comment: Performed at [facility], [address] [place], [place] [address]  Rad Sandria Senter Session Summary     Status: None   Collection Time: 06/17/22 10:22 AM  Result Value Ref Range   Course ID C1_Chest    Course Intent Unknown    Course Start Date 05/25/2022 11:51 AM    Session Number 10    Course First Treatment Date 06/03/2022  1:26 PM    Course Last Treatment Date 06/17/2022 10:21 AM    Course Elapsed Days 14    Reference Point ID Lung_R DP    Reference Point Dosage Given to Date 20 Gy   Reference Point Session Dosage Given 2 Gy   Plan ID Lung_R_IMRT    Plan Name Chest_Rt    Plan Fractions Treated to Date 10    Plan Total Fractions Prescribed 35    Plan Prescribed Dose Per Fraction 2 Gy   Plan Total Prescribed Dose 70.000000 Gy   Plan Primary Reference Point Lung_R DP   Rad Onc Aria Session Summary     Status: None   Collection Time: 06/18/22 10:18 AM  Result Value Ref Range   Course ID C1_Chest    Course Intent Unknown    Course Start Date 05/25/2022 11:51 AM    Session Number 11    Course First Treatment Date 06/03/2022  1:26 PM    Course Last Treatment Date 06/18/2022 10:16 AM    Course Elapsed Days 15    Reference Point ID Lung_R DP    Reference Point Dosage Given to Date 22 Gy  Reference Point Session Dosage Given 2 Gy   Plan ID Lung_R_IMRT    Plan Name Chest_Rt    Plan Fractions Treated to Date 11    Plan Total Fractions Prescribed 35    Plan Prescribed Dose Per Fraction 2 Gy   Plan Total Prescribed Dose 70.000000 Gy   Plan Primary Reference Point Lung_R DP   Rad Onc Aria Session  Summary     Status: None   Collection Time: 06/21/22 10:25 AM  Result Value Ref Range   Course ID C1_Chest    Course Intent Unknown    Course Start Date 05/25/2022 11:51 AM    Session Number 12    Course First Treatment Date 06/03/2022  1:26 PM    Course Last Treatment Date 06/21/2022 10:23 AM    Course Elapsed Days 18    Reference Point ID Lung_R DP    Reference Point Dosage Given to Date 24 Gy   Reference Point Session Dosage Given 2 Gy   Plan ID Lung_R_IMRT    Plan Name Chest_Rt    Plan Fractions Treated to Date 12    Plan Total Fractions Prescribed 35    Plan Prescribed Dose Per Fraction 2 Gy   Plan Total Prescribed Dose 70.000000 Gy   Plan Primary Reference Point Lung_R DP   Rad Onc Aria Session Summary     Status: None   Collection Time: 06/22/22 10:44 AM  Result Value Ref Range   Course ID C1_Chest    Course Intent Unknown    Course Start Date 05/25/2022 11:51 AM    Session Number 13    Course First Treatment Date 06/03/2022  1:26 PM    Course Last Treatment Date 06/22/2022 10:42 AM    Course Elapsed Days 19    Reference Point ID Lung_R DP    Reference Point Dosage Given to Date 26 Gy   Reference Point Session Dosage Given 2 Gy   Plan ID Lung_R_IMRT    Plan Name Chest_Rt    Plan Fractions Treated to Date 13    Plan Total Fractions Prescribed 35    Plan Prescribed Dose Per Fraction 2 Gy   Plan Total Prescribed Dose 70.000000 Gy   Plan Primary Reference Point Lung_R DP   Rad Onc Aria Session Summary     Status: None   Collection Time: 06/23/22 10:18 AM  Result Value Ref Range   Course ID C1_Chest    Course Intent Unknown    Course Start Date 05/25/2022 11:51 AM    Session Number 14    Course First Treatment Date 06/03/2022  1:26 PM    Course Last Treatment Date 06/23/2022 10:17 AM    Course Elapsed Days 20    Reference Point ID Lung_R DP    Reference Point Dosage Given to Date 28 Gy   Reference Point Session Dosage Given 2 Gy   Plan ID Lung_R_IMRT    Plan Name  Chest_Rt    Plan Fractions Treated to Date 14    Plan Total Fractions Prescribed 35    Plan Prescribed Dose Per Fraction 2 Gy   Plan Total Prescribed Dose 70.000000 Gy   Plan Primary Reference Point Lung_R DP   CBC with Differential     Status: Abnormal   Collection Time: 06/24/22  8:18 AM  Result Value Ref Range   WBC 3.7 (L) 4.0 - 10.5 K/uL   RBC 3.70 (L) 4.22 - 5.81 MIL/uL   Hemoglobin 11.3 (L) 13.0 - 17.0 g/dL   HCT  33.3 (L) 39.0 - 52.0 %   MCV 90.0 80.0 - 100.0 fL   MCH 30.5 26.0 - 34.0 pg   MCHC 33.9 30.0 - 36.0 g/dL   RDW 20.5 79.3 - 41.6 %   Platelets 477 (H) 150 - 400 K/uL   nRBC 0.0 0.0 - 0.2 %   Neutrophils Relative % 65 %   Neutro Abs 2.4 1.7 - 7.7 K/uL   Lymphocytes Relative 16 %   Lymphs Abs 0.6 (L) 0.7 - 4.0 K/uL   Monocytes Relative 15 %   Monocytes Absolute 0.6 0.1 - 1.0 K/uL   Eosinophils Relative 2 %   Eosinophils Absolute 0.1 0.0 - 0.5 K/uL   Basophils Relative 1 %   Basophils Absolute 0.0 0.0 - 0.1 K/uL   Immature Granulocytes 1 %   Abs Immature Granulocytes 0.03 0.00 - 0.07 K/uL    Comment: Performed at [facility], [address] [place], [place] [address]  Comprehensive metabolic panel     Status: Abnormal   Collection Time: 06/24/22  8:18 AM  Result Value Ref Range   Sodium 135 135 - 145 mmol/L   Potassium 4.0 3.5 - 5.1 mmol/L   Chloride 102 98 - 111 mmol/L   CO2 28 22 - 32 mmol/L   Glucose, Bld 99 70 - 99 mg/dL    Comment: Glucose reference range applies only to samples taken after fasting for at least 8 hours.   BUN 8 8 - 23 mg/dL   Creatinine, Ser 6.81 (L) 0.61 - 1.24 mg/dL   Calcium 9.2 8.9 - 58.1 mg/dL   Total Protein 7.4 6.5 - 8.1 g/dL   Albumin 3.5 3.5 - 5.0 g/dL   AST 13 (L) 15 - 41 U/L   ALT 12 0 - 44 U/L   Alkaline Phosphatase 109 38 - 126 U/L   Total Bilirubin 0.5 0.3 - 1.2 mg/dL   GFR, Estimated >00 >42 mL/min    Comment: (NOTE) Calculated using the CKD-EPI Creatinine Equation (2021)    Anion gap 5 5 - 15    Comment:  Performed at [facility], [address] [place], [place] [address]  Rad Jeralene Huff Session Summary     Status: None   Collection Time: 06/24/22  9:26 AM  Result Value Ref Range   Course ID C1_Chest    Course Intent Unknown    Course Start Date 05/25/2022 11:51 AM    Session Number 15    Course First Treatment Date 06/03/2022  1:26 PM    Course Last Treatment Date 06/24/2022  9:24 AM    Course Elapsed Days 21    Reference Point ID Lung_R DP    Reference Point Dosage Given to Date 30 Gy   Reference Point Session Dosage Given 2 Gy   Plan ID Lung_R_IMRT    Plan Name Chest_Rt    Plan Fractions Treated to Date 15    Plan Total Fractions Prescribed 35    Plan Prescribed Dose Per Fraction 2 Gy   Plan Total Prescribed Dose 70.000000 Gy   Plan Primary Reference Point Lung_R DP   Rad Onc Aria Session Summary     Status: None   Collection Time: 06/25/22 10:23 AM  Result Value Ref Range   Course ID C1_Chest    Course Intent Unknown    Course Start Date 05/25/2022 11:51 AM    Session Number 16    Course First Treatment Date 06/03/2022  1:26 PM    Course Last Treatment  Date 06/25/2022 10:21 AM    Course Elapsed Days 22    Reference Point ID Lung_R DP    Reference Point Dosage Given to Date 32 Gy   Reference Point Session Dosage Given 2 Gy   Plan ID Lung_R_IMRT    Plan Name Chest_Rt    Plan Fractions Treated to Date 16    Plan Total Fractions Prescribed 35    Plan Prescribed Dose Per Fraction 2 Gy   Plan Total Prescribed Dose 70.000000 Gy   Plan Primary Reference Point Lung_R DP   Rad Onc Aria Session Summary     Status: None   Collection Time: 06/28/22 10:27 AM  Result Value Ref Range   Course ID C1_Chest    Course Intent Unknown    Course Start Date 05/25/2022 11:51 AM    Session Number 17    Course First Treatment Date 06/03/2022  1:26 PM    Course Last Treatment Date 06/28/2022 10:26 AM    Course Elapsed Days 25    Reference Point ID Lung_R DP    Reference Point Dosage Given  to Date 34 Gy   Reference Point Session Dosage Given 2 Gy   Plan ID Lung_R_IMRT    Plan Name Chest_Rt    Plan Fractions Treated to Date 17    Plan Total Fractions Prescribed 35    Plan Prescribed Dose Per Fraction 2 Gy   Plan Total Prescribed Dose 70.000000 Gy   Plan Primary Reference Point Lung_R DP   Rad Onc Aria Session Summary     Status: None   Collection Time: 06/29/22 10:31 AM  Result Value Ref Range   Course ID C1_Chest    Course Intent Unknown    Course Start Date 05/25/2022 11:51 AM    Session Number 18    Course First Treatment Date 06/03/2022  1:26 PM    Course Last Treatment Date 06/29/2022 10:30 AM    Course Elapsed Days 26    Reference Point ID Lung_R DP    Reference Point Dosage Given to Date 36 Gy   Reference Point Session Dosage Given 2 Gy   Plan ID Lung_R_IMRT    Plan Name Chest_Rt    Plan Fractions Treated to Date 18    Plan Total Fractions Prescribed 35    Plan Prescribed Dose Per Fraction 2 Gy   Plan Total Prescribed Dose 70.000000 Gy   Plan Primary Reference Point Lung_R DP   Rad Onc Aria Session Summary     Status: None   Collection Time: 06/30/22 10:23 AM  Result Value Ref Range   Course ID C1_Chest    Course Intent Unknown    Course Start Date 05/25/2022 11:51 AM    Session Number 19    Course First Treatment Date 06/03/2022  1:26 PM    Course Last Treatment Date 06/30/2022 10:22 AM    Course Elapsed Days 27    Reference Point ID Lung_R DP    Reference Point Dosage Given to Date 38 Gy   Reference Point Session Dosage Given 2 Gy   Plan ID Lung_R_IMRT    Plan Name Chest_Rt    Plan Fractions Treated to Date 19    Plan Total Fractions Prescribed 35    Plan Prescribed Dose Per Fraction 2 Gy   Plan Total Prescribed Dose 70.000000 Gy   Plan Primary Reference Point Lung_R DP   Rad Onc Aria Session Summary     Status: None   Collection Time: 07/01/22 10:09 AM  Result Value  Ref Range   Course ID C1_Chest    Course Intent Unknown    Course Start Date  05/25/2022 11:51 AM    Session Number 20    Course First Treatment Date 06/03/2022  1:26 PM    Course Last Treatment Date 07/01/2022 10:07 AM    Course Elapsed Days 28    Reference Point ID Lung_R DP    Reference Point Dosage Given to Date 40 Gy   Reference Point Session Dosage Given 2 Gy   Plan ID Lung_R_IMRT    Plan Name Chest_Rt    Plan Fractions Treated to Date 20    Plan Total Fractions Prescribed 35    Plan Prescribed Dose Per Fraction 2 Gy   Plan Total Prescribed Dose 70.000000 Gy   Plan Primary Reference Point Lung_R DP   Comprehensive metabolic panel     Status: Abnormal   Collection Time: 07/02/22  8:35 AM  Result Value Ref Range   Sodium 135 135 - 145 mmol/L   Potassium 4.1 3.5 - 5.1 mmol/L   Chloride 101 98 - 111 mmol/L   CO2 26 22 - 32 mmol/L   Glucose, Bld 109 (H) 70 - 99 mg/dL    Comment: Glucose reference range applies only to samples taken after fasting for at least 8 hours.   BUN 9 8 - 23 mg/dL   Creatinine, Ser 8.65 (L) 0.61 - 1.24 mg/dL   Calcium 9.3 8.9 - 63.3 mg/dL   Total Protein 7.6 6.5 - 8.1 g/dL   Albumin 3.6 3.5 - 5.0 g/dL   AST 19 15 - 41 U/L   ALT 14 0 - 44 U/L   Alkaline Phosphatase 107 38 - 126 U/L   Total Bilirubin 0.3 0.3 - 1.2 mg/dL   GFR, Estimated >93 >82 mL/min    Comment: (NOTE) Calculated using the CKD-EPI Creatinine Equation (2021)    Anion gap 8 5 - 15    Comment: Performed at [facility], [address] [place], [place] [address]  CBC with Differential     Status: Abnormal   Collection Time: 07/02/22  8:35 AM  Result Value Ref Range   WBC 1.8 (L) 4.0 - 10.5 K/uL   RBC 3.79 (L) 4.22 - 5.81 MIL/uL   Hemoglobin 11.5 (L) 13.0 - 17.0 g/dL   HCT 73.9 (L) 58.5 - 48.0 %   MCV 90.0 80.0 - 100.0 fL   MCH 30.3 26.0 - 34.0 pg   MCHC 33.7 30.0 - 36.0 g/dL   RDW 81.0 40.2 - 22.4 %   Platelets 371 150 - 400 K/uL   nRBC 0.0 0.0 - 0.2 %   Neutrophils Relative % 48 %   Neutro Abs 0.9 (L) 1.7 - 7.7 K/uL   Lymphocytes Relative 30 %    Lymphs Abs 0.5 (L) 0.7 - 4.0 K/uL   Monocytes Relative 17 %   Monocytes Absolute 0.3 0.1 - 1.0 K/uL   Eosinophils Relative 3 %   Eosinophils Absolute 0.1 0.0 - 0.5 K/uL   Basophils Relative 2 %   Basophils Absolute 0.0 0.0 - 0.1 K/uL   Immature Granulocytes 0 %   Abs Immature Granulocytes 0.00 0.00 - 0.07 K/uL    Comment: Performed at [facility], [address] [place], [place] [address]  Rad Jeralene Huff Session Summary     Status: None   Collection Time: 07/02/22 10:45 AM  Result Value Ref Range   Course ID C1_Chest    Course Intent Unknown  Course Start Date 05/25/2022 11:51 AM    Session Number 21    Course First Treatment Date 06/03/2022  1:26 PM    Course Last Treatment Date 07/02/2022 10:43 AM    Course Elapsed Days 29    Reference Point ID Lung_R DP    Reference Point Dosage Given to Date 42 Gy   Reference Point Session Dosage Given 2 Gy   Plan ID Lung_R_IMRT    Plan Name Chest_Rt    Plan Fractions Treated to Date 21    Plan Total Fractions Prescribed 35    Plan Prescribed Dose Per Fraction 2 Gy   Plan Total Prescribed Dose 70.000000 Gy   Plan Primary Reference Point Lung_R DP   Rad Onc Aria Session Summary     Status: None   Collection Time: 07/05/22  8:08 AM  Result Value Ref Range   Course ID C1_Chest    Course Intent Unknown    Course Start Date 05/25/2022 11:51 AM    Session Number 22    Course First Treatment Date 06/03/2022  1:26 PM    Course Last Treatment Date 07/05/2022  8:07 AM    Course Elapsed Days 32    Reference Point ID Lung_R DP    Reference Point Dosage Given to Date 44 Gy   Reference Point Session Dosage Given 2 Gy   Plan ID Lung_R_IMRT    Plan Name Chest_Rt    Plan Fractions Treated to Date 22    Plan Total Fractions Prescribed 35    Plan Prescribed Dose Per Fraction 2 Gy   Plan Total Prescribed Dose 70.000000 Gy   Plan Primary Reference Point Lung_R DP   CBC with Differential     Status: Abnormal   Collection Time: 07/05/22  8:20  AM  Result Value Ref Range   WBC 1.8 (L) 4.0 - 10.5 K/uL   RBC 3.75 (L) 4.22 - 5.81 MIL/uL   Hemoglobin 11.5 (L) 13.0 - 17.0 g/dL   HCT 34.0 (L) 39.0 - 52.0 %   MCV 90.7 80.0 - 100.0 fL   MCH 30.7 26.0 - 34.0 pg   MCHC 33.8 30.0 - 36.0 g/dL   RDW 14.4 11.5 - 15.5 %   Platelets 331 150 - 400 K/uL   nRBC 0.0 0.0 - 0.2 %   Neutrophils Relative % 40 %   Neutro Abs 0.7 (L) 1.7 - 7.7 K/uL   Lymphocytes Relative 33 %   Lymphs Abs 0.6 (L) 0.7 - 4.0 K/uL   Monocytes Relative 22 %   Monocytes Absolute 0.4 0.1 - 1.0 K/uL   Eosinophils Relative 2 %   Eosinophils Absolute 0.0 0.0 - 0.5 K/uL   Basophils Relative 2 %   Basophils Absolute 0.0 0.0 - 0.1 K/uL   Immature Granulocytes 1 %   Abs Immature Granulocytes 0.01 0.00 - 0.07 K/uL    Comment: Performed at [facility], [address] [place], [place] [address]  Comprehensive metabolic panel     Status: Abnormal   Collection Time: 07/05/22  8:20 AM  Result Value Ref Range   Sodium 137 135 - 145 mmol/L   Potassium 3.8 3.5 - 5.1 mmol/L   Chloride 103 98 - 111 mmol/L   CO2 28 22 - 32 mmol/L   Glucose, Bld 97 70 - 99 mg/dL    Comment: Glucose reference range applies only to samples taken after fasting for at least 8 hours.   BUN 9 8 - 23 mg/dL   Creatinine, Ser 0.43 (  L) 0.61 - 1.24 mg/dL   Calcium 9.0 8.9 - 25.9 mg/dL   Total Protein 7.3 6.5 - 8.1 g/dL   Albumin 3.6 3.5 - 5.0 g/dL   AST 18 15 - 41 U/L   ALT 16 0 - 44 U/L   Alkaline Phosphatase 90 38 - 126 U/L   Total Bilirubin 0.3 0.3 - 1.2 mg/dL   GFR, Estimated >83 >78 mL/min    Comment: (NOTE) Calculated using the CKD-EPI Creatinine Equation (2021)    Anion gap 6 5 - 15    Comment: Performed at [facility], [address] [place], [place] [address]  Rad Jeralene Huff Session Summary     Status: None   Collection Time: 07/06/22 10:22 AM  Result Value Ref Range   Course ID C1_Chest    Course Intent Unknown    Course Start Date 05/25/2022 11:51 AM    Session Number 23     Course First Treatment Date 06/03/2022  1:26 PM    Course Last Treatment Date 07/06/2022 10:21 AM    Course Elapsed Days 33    Reference Point ID Lung_R DP    Reference Point Dosage Given to Date 46 Gy   Reference Point Session Dosage Given 2 Gy   Plan ID Lung_R_IMRT    Plan Name Chest_Rt    Plan Fractions Treated to Date 23    Plan Total Fractions Prescribed 35    Plan Prescribed Dose Per Fraction 2 Gy   Plan Total Prescribed Dose 70.000000 Gy   Plan Primary Reference Point Lung_R DP   Rad Onc Aria Session Summary     Status: None   Collection Time: 07/07/22 10:29 AM  Result Value Ref Range   Course ID C1_Chest    Course Intent Unknown    Course Start Date 05/25/2022 11:51 AM    Session Number 24    Course First Treatment Date 06/03/2022  1:26 PM    Course Last Treatment Date 07/07/2022 10:27 AM    Course Elapsed Days 34    Reference Point ID Lung_R DP    Reference Point Dosage Given to Date 48 Gy   Reference Point Session Dosage Given 2 Gy   Plan ID Lung_R_IMRT    Plan Name Chest_Rt    Plan Fractions Treated to Date 24    Plan Total Fractions Prescribed 35    Plan Prescribed Dose Per Fraction 2 Gy   Plan Total Prescribed Dose 70.000000 Gy   Plan Primary Reference Point Lung_R DP   CBC with Differential     Status: Abnormal   Collection Time: 07/07/22 10:41 AM  Result Value Ref Range   WBC 21.7 (H) 4.0 - 10.5 K/uL   RBC 3.70 (L) 4.22 - 5.81 MIL/uL   Hemoglobin 11.4 (L) 13.0 - 17.0 g/dL   HCT 98.5 (L) 19.0 - 34.7 %   MCV 91.1 80.0 - 100.0 fL   MCH 30.8 26.0 - 34.0 pg   MCHC 33.8 30.0 - 36.0 g/dL   RDW 45.1 21.9 - 21.7 %   Platelets 291 150 - 400 K/uL   nRBC 0.0 0.0 - 0.2 %   Neutrophils Relative % 86 %   Neutro Abs 18.7 (H) 1.7 - 7.7 K/uL   Lymphocytes Relative 5 %   Lymphs Abs 1.0 0.7 - 4.0 K/uL   Monocytes Relative 5 %   Monocytes Absolute 1.0 0.1 - 1.0 K/uL   Eosinophils Relative 0 %   Eosinophils Absolute 0.0 0.0 - 0.5 K/uL  Basophils Relative 0 %    Basophils Absolute 0.1 0.0 - 0.1 K/uL   Immature Granulocytes 4 %   Abs Immature Granulocytes 0.93 (H) 0.00 - 0.07 K/uL    Comment: Performed at [facility], [address] [place], [place] [address]  Type and screen     Status: None   Collection Time: 07/07/22 10:41 AM  Result Value Ref Range   ABO/RH(D) O POS    Antibody Screen NEG    Sample Expiration      07/10/2022,2359 Performed at [facility], [address] [place], [place] [address]   Rad Jeralene Huff Session Summary     Status: None   Collection Time: 07/08/22 10:20 AM  Result Value Ref Range   Course ID C1_Chest    Course Intent Unknown    Course Start Date 05/25/2022 11:51 AM    Session Number 25    Course First Treatment Date 06/03/2022  1:26 PM    Course Last Treatment Date 07/08/2022 10:19 AM    Course Elapsed Days 35    Reference Point ID Lung_R DP    Reference Point Dosage Given to Date 50 Gy   Reference Point Session Dosage Given 2 Gy   Plan ID Lung_R_IMRT    Plan Name Chest_Rt    Plan Fractions Treated to Date 25    Plan Total Fractions Prescribed 35    Plan Prescribed Dose Per Fraction 2 Gy   Plan Total Prescribed Dose 70.000000 Gy   Plan Primary Reference Point Lung_R DP   Rad Onc Aria Session Summary     Status: None   Collection Time: 07/09/22 10:00 AM  Result Value Ref Range   Course ID C1_Chest    Course Intent Unknown    Course Start Date 05/25/2022 11:51 AM    Session Number 26    Course First Treatment Date 06/03/2022  1:26 PM    Course Last Treatment Date 07/09/2022  9:59 AM    Course Elapsed Days 36    Reference Point ID Lung_R DP    Reference Point Dosage Given to Date 52 Gy   Reference Point Session Dosage Given 2 Gy   Plan ID Lung_R_IMRT    Plan Name Chest_Rt    Plan Fractions Treated to Date 26    Plan Total Fractions Prescribed 35    Plan Prescribed Dose Per Fraction 2 Gy   Plan Total Prescribed Dose 70.000000 Gy   Plan Primary Reference Point Lung_R DP    Comprehensive metabolic panel     Status: Abnormal   Collection Time: 07/09/22 10:15 AM  Result Value Ref Range   Sodium 135 135 - 145 mmol/L   Potassium 3.8 3.5 - 5.1 mmol/L   Chloride 101 98 - 111 mmol/L   CO2 26 22 - 32 mmol/L   Glucose, Bld 88 70 - 99 mg/dL    Comment: Glucose reference range applies only to samples taken after fasting for at least 8 hours.   BUN 8 8 - 23 mg/dL   Creatinine, Ser 4.03 (L) 0.61 - 1.24 mg/dL   Calcium 9.2 8.9 - 97.9 mg/dL   Total Protein 7.5 6.5 - 8.1 g/dL   Albumin 3.6 3.5 - 5.0 g/dL   AST 15 15 - 41 U/L   ALT 15 0 - 44 U/L   Alkaline Phosphatase 104 38 - 126 U/L   Total Bilirubin 0.5 0.3 - 1.2 mg/dL   GFR, Estimated >53 >69 mL/min    Comment: (NOTE) Calculated  using the CKD-EPI Creatinine Equation (2021)    Anion gap 8 5 - 15    Comment: Performed at [facility], [address] [place], [place] [address]  CBC with Differential     Status: Abnormal   Collection Time: 07/09/22 10:15 AM  Result Value Ref Range   WBC 7.3 4.0 - 10.5 K/uL   RBC 3.70 (L) 4.22 - 5.81 MIL/uL   Hemoglobin 11.3 (L) 13.0 - 17.0 g/dL   HCT 88.3 (L) 55.1 - 42.9 %   MCV 90.3 80.0 - 100.0 fL   MCH 30.5 26.0 - 34.0 pg   MCHC 33.8 30.0 - 36.0 g/dL   RDW 57.0 81.6 - 46.7 %   Platelets 254 150 - 400 K/uL   nRBC 0.0 0.0 - 0.2 %   Neutrophils Relative % 81 %   Neutro Abs 5.9 1.7 - 7.7 K/uL   Lymphocytes Relative 10 %   Lymphs Abs 0.7 0.7 - 4.0 K/uL   Monocytes Relative 7 %   Monocytes Absolute 0.5 0.1 - 1.0 K/uL   Eosinophils Relative 1 %   Eosinophils Absolute 0.1 0.0 - 0.5 K/uL   Basophils Relative 0 %   Basophils Absolute 0.0 0.0 - 0.1 K/uL   Immature Granulocytes 1 %   Abs Immature Granulocytes 0.05 0.00 - 0.07 K/uL    Comment: Performed at [facility], [address] [place], [place] [address]  Rad Jeralene Huff Session Summary     Status: None   Collection Time: 07/12/22  8:20 AM  Result Value Ref Range   Course ID C1_Chest    Course Intent  Unknown    Course Start Date 05/25/2022 11:51 AM    Session Number 27    Course First Treatment Date 06/03/2022  1:26 PM    Course Last Treatment Date 07/12/2022  8:18 AM    Course Elapsed Days 39    Reference Point ID Lung_R DP    Reference Point Dosage Given to Date 54 Gy   Reference Point Session Dosage Given 2 Gy   Plan ID Lung_R_IMRT    Plan Name Chest_Rt    Plan Fractions Treated to Date 27    Plan Total Fractions Prescribed 35    Plan Prescribed Dose Per Fraction 2 Gy   Plan Total Prescribed Dose 70.000000 Gy   Plan Primary Reference Point Lung_R DP   Rad Onc Aria Session Summary     Status: None   Collection Time: 07/13/22 10:41 AM  Result Value Ref Range   Course ID C1_Chest    Course Intent Unknown    Course Start Date 05/25/2022 11:51 AM    Session Number 28    Course First Treatment Date 06/03/2022  1:26 PM    Course Last Treatment Date 07/13/2022 10:40 AM    Course Elapsed Days 40    Reference Point ID Lung_R DP    Reference Point Dosage Given to Date 56 Gy   Reference Point Session Dosage Given 2 Gy   Plan ID Lung_R_IMRT    Plan Name Chest_Rt    Plan Fractions Treated to Date 28    Plan Total Fractions Prescribed 35    Plan Prescribed Dose Per Fraction 2 Gy   Plan Total Prescribed Dose 70.000000 Gy   Plan Primary Reference Point Lung_R DP   Rad Onc Aria Session Summary     Status: None   Collection Time: 07/14/22 10:19 AM  Result Value Ref Range   Course ID C1_Chest  Course Intent Unknown    Course Start Date 05/25/2022 11:51 AM    Session Number 29    Course First Treatment Date 06/03/2022  1:26 PM    Course Last Treatment Date 07/14/2022 10:17 AM    Course Elapsed Days 41    Reference Point ID Lung_R DP    Reference Point Dosage Given to Date 58 Gy   Reference Point Session Dosage Given 2 Gy   Plan ID Lung_R_IMRT    Plan Name Chest_Rt    Plan Fractions Treated to Date 29    Plan Total Fractions Prescribed 35    Plan Prescribed Dose Per Fraction 2 Gy    Plan Total Prescribed Dose 70.000000 Gy   Plan Primary Reference Point Lung_R DP   Rad Onc Aria Session Summary     Status: None   Collection Time: 07/19/22 10:21 AM  Result Value Ref Range   Course ID C1_Chest    Course Intent Unknown    Course Start Date 05/25/2022 11:51 AM    Session Number 30    Course First Treatment Date 06/03/2022  1:26 PM    Course Last Treatment Date 07/19/2022 10:20 AM    Course Elapsed Days 46    Reference Point ID Lung_R DP    Reference Point Dosage Given to Date 60 Gy   Reference Point Session Dosage Given 2 Gy   Plan ID Lung_R_IMRT    Plan Name Chest_Rt    Plan Fractions Treated to Date 30    Plan Total Fractions Prescribed 35    Plan Prescribed Dose Per Fraction 2 Gy   Plan Total Prescribed Dose 70.000000 Gy   Plan Primary Reference Point Lung_R DP   Rad Onc Aria Session Summary     Status: None   Collection Time: 07/20/22 10:13 AM  Result Value Ref Range   Course ID C1_Chest    Course Intent Unknown    Course Start Date 05/25/2022 11:51 AM    Session Number 31    Course First Treatment Date 06/03/2022  1:26 PM    Course Last Treatment Date 07/20/2022 10:11 AM    Course Elapsed Days 47    Reference Point ID Lung_R DP    Reference Point Dosage Given to Date 62 Gy   Reference Point Session Dosage Given 2 Gy   Plan ID Lung_R_IMRT    Plan Name Chest_Rt    Plan Fractions Treated to Date 15    Plan Total Fractions Prescribed 35    Plan Prescribed Dose Per Fraction 2 Gy   Plan Total Prescribed Dose 70.000000 Gy   Plan Primary Reference Point Lung_R DP   Rad Onc Aria Session Summary     Status: None   Collection Time: 07/21/22 10:34 AM  Result Value Ref Range   Course ID C1_Chest    Course Intent Unknown    Course Start Date 05/25/2022 11:51 AM    Session Number 32    Course First Treatment Date 06/03/2022  1:26 PM    Course Last Treatment Date 07/21/2022 10:32 AM    Course Elapsed Days 48    Reference Point ID Lung_R DP    Reference Point  Dosage Given to Date 64 Gy   Reference Point Session Dosage Given 2 Gy   Plan ID Lung_R_IMRT    Plan Name Chest_Rt    Plan Fractions Treated to Date 72    Plan Total Fractions Prescribed 35    Plan Prescribed Dose Per Fraction 2 Gy   Plan  Total Prescribed Dose 70.000000 Gy   Plan Primary Reference Point Lung_R DP   Rad Onc Aria Session Summary     Status: None   Collection Time: 07/22/22 10:21 AM  Result Value Ref Range   Course ID C1_Chest    Course Intent Unknown    Course Start Date 05/25/2022 11:51 AM    Session Number 33    Course First Treatment Date 06/03/2022  1:26 PM    Course Last Treatment Date 07/22/2022 10:20 AM    Course Elapsed Days 49    Reference Point ID Lung_R DP    Reference Point Dosage Given to Date 66 Gy   Reference Point Session Dosage Given 2 Gy   Plan ID Lung_R_IMRT    Plan Name Chest_Rt    Plan Fractions Treated to Date 49    Plan Total Fractions Prescribed 35    Plan Prescribed Dose Per Fraction 2 Gy   Plan Total Prescribed Dose 70.000000 Gy   Plan Primary Reference Point Lung_R DP   Rad Onc Aria Session Summary     Status: None   Collection Time: 07/23/22 11:58 AM  Result Value Ref Range   Course ID C1_Chest    Course Intent Unknown    Course Start Date 05/25/2022 11:51 AM    Session Number 34    Course First Treatment Date 06/03/2022  1:26 PM    Course Last Treatment Date 07/23/2022 11:56 AM    Course Elapsed Days 50    Reference Point ID Lung_R DP    Reference Point Dosage Given to Date 68 Gy   Reference Point Session Dosage Given 2 Gy   Plan ID Lung_R_IMRT    Plan Name Chest_Rt    Plan Fractions Treated to Date 26    Plan Total Fractions Prescribed 35    Plan Prescribed Dose Per Fraction 2 Gy   Plan Total Prescribed Dose 70.000000 Gy   Plan Primary Reference Point Lung_R DP   Comprehensive metabolic panel     Status: Abnormal   Collection Time: 07/26/22 10:02 AM  Result Value Ref Range   Sodium 139 135 - 145 mmol/L   Potassium 4.2 3.5  - 5.1 mmol/L   Chloride 103 98 - 111 mmol/L   CO2 29 22 - 32 mmol/L   Glucose, Bld 104 (H) 70 - 99 mg/dL    Comment: Glucose reference range applies only to samples taken after fasting for at least 8 hours.   BUN 8 8 - 23 mg/dL   Creatinine, Ser 0.87 (L) 0.61 - 1.24 mg/dL   Calcium 9.1 8.9 - 95.2 mg/dL   Total Protein 7.5 6.5 - 8.1 g/dL   Albumin 3.8 3.5 - 5.0 g/dL   AST 17 15 - 41 U/L   ALT 16 0 - 44 U/L   Alkaline Phosphatase 87 38 - 126 U/L   Total Bilirubin 0.6 0.3 - 1.2 mg/dL   GFR, Estimated >71 >51 mL/min    Comment: (NOTE) Calculated using the CKD-EPI Creatinine Equation (2021)    Anion gap 7 5 - 15    Comment: Performed at [facility], [address] [place], [place] [address]  CBC with Differential     Status: Abnormal   Collection Time: 07/26/22 10:02 AM  Result Value Ref Range   WBC 1.5 (L) 4.0 - 10.5 K/uL   RBC 3.92 (L) 4.22 - 5.81 MIL/uL   Hemoglobin 12.2 (L) 13.0 - 17.0 g/dL   HCT 76.5 (L) 75.3 - 06.9 %  MCV 91.6 80.0 - 100.0 fL   MCH 31.1 26.0 - 34.0 pg   MCHC 34.0 30.0 - 36.0 g/dL   RDW 15.5 11.5 - 15.5 %   Platelets 292 150 - 400 K/uL   nRBC 0.0 0.0 - 0.2 %   Neutrophils Relative % 38 %   Neutro Abs 0.6 (L) 1.7 - 7.7 K/uL   Lymphocytes Relative 34 %   Lymphs Abs 0.5 (L) 0.7 - 4.0 K/uL   Monocytes Relative 21 %   Monocytes Absolute 0.3 0.1 - 1.0 K/uL   Eosinophils Relative 5 %   Eosinophils Absolute 0.1 0.0 - 0.5 K/uL   Basophils Relative 1 %   Basophils Absolute 0.0 0.0 - 0.1 K/uL   Immature Granulocytes 1 %   Abs Immature Granulocytes 0.01 0.00 - 0.07 K/uL    Comment: Performed at [facility], [address] [place], [place] [address]  Rad Sandria Senter Session Summary     Status: None   Collection Time: 07/26/22 10:41 AM  Result Value Ref Range   Course ID C1_Chest    Course Intent Unknown    Course Start Date 05/25/2022 11:51 AM    Session Number 35    Course First Treatment Date 06/03/2022  1:26 PM    Course Last Treatment Date  07/26/2022 10:39 AM    Course Elapsed Days 53    Reference Point ID Lung_R DP    Reference Point Dosage Given to Date 70 Gy   Reference Point Session Dosage Given 2 Gy   Plan ID Lung_R_IMRT    Plan Name Chest_Rt    Plan Fractions Treated to Date 35    Plan Total Fractions Prescribed 35    Plan Prescribed Dose Per Fraction 2 Gy   Plan Total Prescribed Dose 70.000000 Gy   Plan Primary Reference Point Lung_R DP   TSH     Status: None   Collection Time: 08/03/22  9:39 AM  Result Value Ref Range   TSH 1.271 0.350 - 4.500 uIU/mL    Comment: Performed by a 3rd Generation assay with a functional sensitivity of <=0.01 uIU/mL. Performed at [facility], [address] [place], [place] [address]   T4     Status: None   Collection Time: 08/03/22  9:39 AM  Result Value Ref Range   T4, Total 5.3 4.5 - 12.0 ug/dL    Comment: (NOTE) Performed At: [facility] [place], [place] [id number] Rush Farmer MD VO:[id number]   Comprehensive metabolic panel     Status: Abnormal   Collection Time: 08/03/22  9:39 AM  Result Value Ref Range   Sodium 138 135 - 145 mmol/L   Potassium 3.9 3.5 - 5.1 mmol/L   Chloride 105 98 - 111 mmol/L   CO2 25 22 - 32 mmol/L   Glucose, Bld 87 70 - 99 mg/dL    Comment: Glucose reference range applies only to samples taken after fasting for at least 8 hours.   BUN 9 8 - 23 mg/dL   Creatinine, Ser 0.47 (L) 0.61 - 1.24 mg/dL   Calcium 8.6 (L) 8.9 - 10.3 mg/dL   Total Protein 7.1 6.5 - 8.1 g/dL   Albumin 3.5 3.5 - 5.0 g/dL   AST 17 15 - 41 U/L   ALT 16 0 - 44 U/L   Alkaline Phosphatase 83 38 - 126 U/L   Total Bilirubin 0.6 0.3 - 1.2 mg/dL   GFR, Estimated >60 >60 mL/min    Comment: (  NOTE) Calculated using the CKD-EPI Creatinine Equation (2021)    Anion gap 8 5 - 15    Comment: Performed at [facility], [address] [place], [place] [address]  CBC with Differential     Status: Abnormal   Collection Time: 08/03/22  9:39 AM   Result Value Ref Range   WBC 2.0 (L) 4.0 - 10.5 K/uL   RBC 3.65 (L) 4.22 - 5.81 MIL/uL   Hemoglobin 11.4 (L) 13.0 - 17.0 g/dL   HCT 27.9 (L) 64.9 - 71.3 %   MCV 91.2 80.0 - 100.0 fL   MCH 31.2 26.0 - 34.0 pg   MCHC 34.2 30.0 - 36.0 g/dL   RDW 00.8 (H) 01.1 - 90.3 %   Platelets 323 150 - 400 K/uL   nRBC 0.0 0.0 - 0.2 %   Neutrophils Relative % 38 %   Neutro Abs 0.8 (L) 1.7 - 7.7 K/uL   Lymphocytes Relative 31 %   Lymphs Abs 0.6 (L) 0.7 - 4.0 K/uL   Monocytes Relative 25 %   Monocytes Absolute 0.5 0.1 - 1.0 K/uL   Eosinophils Relative 3 %   Eosinophils Absolute 0.1 0.0 - 0.5 K/uL   Basophils Relative 2 %   Basophils Absolute 0.0 0.0 - 0.1 K/uL   Immature Granulocytes 1 %   Abs Immature Granulocytes 0.01 0.00 - 0.07 K/uL    Comment: Performed at [facility], [address] [place], [place] [address]  TSH     Status: None   Collection Time: 08/12/22  9:36 AM  Result Value Ref Range   TSH 1.117 0.350 - 4.500 uIU/mL    Comment: Performed by a 3rd Generation assay with a functional sensitivity of <=0.01 uIU/mL. Performed at [facility], [address] [place], [place] [address]   T4     Status: None   Collection Time: 08/12/22  9:36 AM  Result Value Ref Range   T4, Total 4.8 4.5 - 12.0 ug/dL    Comment: (NOTE) Performed At: [facility] [address] [place], [place] [id number] Jolene Schimke MD IO:[id number]   Comprehensive metabolic panel     Status: Abnormal   Collection Time: 08/12/22  9:36 AM  Result Value Ref Range   Sodium 138 135 - 145 mmol/L   Potassium 4.1 3.5 - 5.1 mmol/L   Chloride 103 98 - 111 mmol/L   CO2 28 22 - 32 mmol/L   Glucose, Bld 104 (H) 70 - 99 mg/dL    Comment: Glucose reference range applies only to samples taken after fasting for at least 8 hours.   BUN 7 (L) 8 - 23 mg/dL   Creatinine, Ser 8.36 (L) 0.61 - 1.24 mg/dL   Calcium 9.0 8.9 - 07.9 mg/dL   Total Protein 7.3 6.5 - 8.1 g/dL   Albumin 3.8 3.5 - 5.0 g/dL    AST 20 15 - 41 U/L   ALT 19 0 - 44 U/L   Alkaline Phosphatase 87 38 - 126 U/L   Total Bilirubin 0.5 0.3 - 1.2 mg/dL   GFR, Estimated >75 >14 mL/min    Comment: (NOTE) Calculated using the CKD-EPI Creatinine Equation (2021)    Anion gap 7 5 - 15    Comment: Performed at [facility], [address] [place], [place] [address]  CBC with Differential     Status: Abnormal   Collection Time: 08/12/22  9:36 AM  Result Value Ref Range   WBC 2.0 (L) 4.0 - 10.5  K/uL   RBC 3.96 (L) 4.22 - 5.81 MIL/uL   Hemoglobin 12.4 (L) 13.0 - 17.0 g/dL   HCT 26.1 (L) 67.4 - 62.8 %   MCV 90.2 80.0 - 100.0 fL   MCH 31.3 26.0 - 34.0 pg   MCHC 34.7 30.0 - 36.0 g/dL   RDW 28.6 68.9 - 37.5 %   Platelets 352 150 - 400 K/uL   nRBC 0.0 0.0 - 0.2 %   Neutrophils Relative % 40 %   Neutro Abs 0.8 (L) 1.7 - 7.7 K/uL   Lymphocytes Relative 34 %   Lymphs Abs 0.7 0.7 - 4.0 K/uL   Monocytes Relative 22 %   Monocytes Absolute 0.4 0.1 - 1.0 K/uL   Eosinophils Relative 3 %   Eosinophils Absolute 0.1 0.0 - 0.5 K/uL   Basophils Relative 1 %   Basophils Absolute 0.0 0.0 - 0.1 K/uL   Immature Granulocytes 0 %   Abs Immature Granulocytes 0.00 0.00 - 0.07 K/uL    Comment: Performed at [facility], [address] [place], [place] [address]  Comprehensive metabolic panel     Status: Abnormal   Collection Time: 08/19/22  9:31 AM  Result Value Ref Range   Sodium 138 135 - 145 mmol/L   Potassium 3.9 3.5 - 5.1 mmol/L   Chloride 105 98 - 111 mmol/L   CO2 27 22 - 32 mmol/L   Glucose, Bld 94 70 - 99 mg/dL    Comment: Glucose reference range applies only to samples taken after fasting for at least 8 hours.   BUN 11 8 - 23 mg/dL   Creatinine, Ser 4.39 (L) 0.61 - 1.24 mg/dL   Calcium 9.0 8.9 - 12.9 mg/dL   Total Protein 7.3 6.5 - 8.1 g/dL   Albumin 3.8 3.5 - 5.0 g/dL   AST 17 15 - 41 U/L   ALT 20 0 - 44 U/L   Alkaline Phosphatase 90 38 - 126 U/L   Total Bilirubin 0.3 0.3 - 1.2 mg/dL   GFR, Estimated >90 >20  mL/min    Comment: (NOTE) Calculated using the CKD-EPI Creatinine Equation (2021)    Anion gap 6 5 - 15    Comment: Performed at [facility], [address] [place], [place] [address]  CBC with Differential     Status: Abnormal   Collection Time: 08/19/22  9:31 AM  Result Value Ref Range   WBC 2.7 (L) 4.0 - 10.5 K/uL   RBC 3.89 (L) 4.22 - 5.81 MIL/uL   Hemoglobin 12.4 (L) 13.0 - 17.0 g/dL   HCT 16.1 (L) 06.6 - 16.8 %   MCV 89.7 80.0 - 100.0 fL   MCH 31.9 26.0 - 34.0 pg   MCHC 35.5 30.0 - 36.0 g/dL   RDW 15.8 10.0 - 42.2 %   Platelets 345 150 - 400 K/uL   nRBC 0.0 0.0 - 0.2 %   Neutrophils Relative % 54 %   Neutro Abs 1.5 (L) 1.7 - 7.7 K/uL   Lymphocytes Relative 26 %   Lymphs Abs 0.7 0.7 - 4.0 K/uL   Monocytes Relative 17 %   Monocytes Absolute 0.5 0.1 - 1.0 K/uL   Eosinophils Relative 2 %   Eosinophils Absolute 0.1 0.0 - 0.5 K/uL   Basophils Relative 1 %   Basophils Absolute 0.0 0.0 - 0.1 K/uL   Immature Granulocytes 0 %   Abs Immature Granulocytes 0.01 0.00 - 0.07 K/uL    Comment: Performed at [facility], [address], [place], [place] [address]        Assessment/Plan: 1. Encounter for general adult medical examination with abnormal findings CPE performed, consider rescheduling colonoscopy when able, labs monitored by oncology  2. Squamous cell carcinoma of left lung (HCC) Followed by oncology  3. Mass of soft tissue of neck Per pt present for years and not bothersome. Not flagged on PET scan last year. Continue to monitor and contact office if any changes.  4. History of seizure disorder - Dilantin (Phenytoin) level, total  5. B12 deficiency - B12 and Folate Panel   General Counseling: Sencere verbalizes understanding of the findings of todays visit and agrees with plan of treatment. I have discussed any further diagnostic evaluation that may be needed or ordered today. We also reviewed his medications today. he has been encouraged to call  the office with any questions or concerns that should arise related to todays visit.    Counseling:    Orders Placed This Encounter  Procedures   B12 and Folate Panel   Dilantin (Phenytoin) level, total    No orders of the defined types were placed in this encounter.   This patient was seen by Lynn Ito, PA-C in collaboration with Dr. Beverely Risen as a part of collaborative care agreement.  Total time spent:35 Minutes  Time spent includes review of chart, medications, test results, and follow up plan with the patient.     Lyndon Code, MD  Internal Medicine

## 2022-09-02 NOTE — Progress Notes (Signed)
Radiation Oncology Follow up Note  Name: Derek Webb   Date:   09/02/2022 MRN:  [id number] DOB: Mar 28, 1951    This 72 y.o. 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  REFERRING PROVIDER: Mylinda Latina, PA*  HPI: Patient is a 72 year old male now out 1 month having completed concurrent chemoradiation therapy for stage III squamous cell carcinoma the right lung.  Seen today in routine follow-up he is doing well specifically [place] dysphagia cough hemoptysis chest tightness or any change in his pulmonary status..  He has a CT scan of his chest ordered for next week.  COMPLICATIONS OF TREATMENT: none  FOLLOW UP COMPLIANCE: keeps appointments   PHYSICAL EXAM:  BP 122/80 (BP Location: Right Arm, Patient Position: Sitting, Cuff Size: Normal)   Pulse 63   Temp 98.4 F (36.9 C) (Tympanic)   Resp 12   Wt 170 lb (77.1 kg)   BMI 26.63 kg/m  Well-developed well-nourished patient in NAD. HEENT reveals PERLA, EOMI, discs not visualized.  Oral cavity is clear. No oral mucosal lesions are identified. Neck is clear without evidence of cervical or supraclavicular adenopathy. Lungs are clear to A&P. Cardiac examination is essentially unremarkable with regular rate and rhythm without murmur rub or thrill. Abdomen is benign with no organomegaly or masses noted. Motor sensory and DTR levels are equal and symmetric in the upper and lower extremities. Cranial nerves II through XII are grossly intact. Proprioception is intact. No peripheral adenopathy or edema is identified. No motor or sensory levels are noted. Crude visual fields are within normal range.  RADIOLOGY RESULTS: No current films for review  PLAN: Present time patient is doing well very low side effect profile from his concurrent chemoradiation.  He will have a CT scan of his chest next week which I will review when it is  available.  I have asked to see him back in 3 to 4 months we will probably repeat a CT scan at that time.  Patient knows to call with any concerns.  I would like to take this opportunity to thank you for allowing me to participate in the care of your patient.Noreene Filbert, MD

## 2022-09-03 ENCOUNTER — Other Ambulatory Visit: Payer: Self-pay

## 2022-09-06 ENCOUNTER — Telehealth: Payer: Self-pay | Admitting: Internal Medicine

## 2022-09-06 ENCOUNTER — Ambulatory Visit
Admission: RE | Admit: 2022-09-06 | Discharge: 2022-09-06 | Disposition: A | Discharge status: Home / Self Care | Payer: 59 | Source: Ambulatory Visit | Attending: Internal Medicine | Admitting: Internal Medicine

## 2022-09-06 ENCOUNTER — Other Ambulatory Visit: Payer: Self-pay | Admitting: Internal Medicine

## 2022-09-06 DIAGNOSIS — J9811 Atelectasis: Secondary | ICD-10-CM | POA: Diagnosis not present

## 2022-09-06 DIAGNOSIS — C3491 Malignant neoplasm of unspecified part of right bronchus or lung: Secondary | ICD-10-CM | POA: Diagnosis not present

## 2022-09-06 DIAGNOSIS — E538 Deficiency of other specified B group vitamins: Secondary | ICD-10-CM | POA: Diagnosis not present

## 2022-09-06 DIAGNOSIS — Z8669 Personal history of other diseases of the nervous system and sense organs: Secondary | ICD-10-CM | POA: Diagnosis not present

## 2022-09-06 DIAGNOSIS — I7121 Aneurysm of the ascending aorta, without rupture: Secondary | ICD-10-CM | POA: Diagnosis not present

## 2022-09-06 DIAGNOSIS — J439 Emphysema, unspecified: Secondary | ICD-10-CM | POA: Diagnosis not present

## 2022-09-06 DIAGNOSIS — I700 Atherosclerosis of aorta: Secondary | ICD-10-CM | POA: Diagnosis not present

## 2022-09-06 MED ORDER — IOHEXOL 300 MG/ML  SOLN
75.0000 mL | Freq: Once | INTRAMUSCULAR | Status: AC | PRN
  Administered 2022-09-06: 75 mL via INTRAVENOUS

## 2022-09-06 NOTE — Progress Notes (Signed)
Updated rx plan to 1/22 as requested by patient

## 2022-09-06 NOTE — Telephone Encounter (Signed)
Updated to 1/22.

## 2022-09-06 NOTE — Telephone Encounter (Signed)
pt sister called in to have Chemo appts moved to monday. Please adjust IS if ok.

## 2022-09-07 LAB — B12 AND FOLATE PANEL
Folate: 7.8 ng/mL (ref >3.0)
Vitamin B-12: 690 pg/mL (ref 232–1245)

## 2022-09-07 LAB — PHENYTOIN LEVEL, TOTAL: Phenytoin (Dilantin), Serum: 8.2 ug/mL — ABNORMAL LOW (ref 10.0–20.0)

## 2022-09-09 ENCOUNTER — Encounter: Payer: Self-pay | Admitting: Internal Medicine

## 2022-09-09 ENCOUNTER — Ambulatory Visit: Payer: Medicare Other | Admitting: Internal Medicine

## 2022-09-09 ENCOUNTER — Inpatient Hospital Stay (HOSPITAL_BASED_OUTPATIENT_CLINIC_OR_DEPARTMENT_OTHER): Payer: 59 | Admitting: Internal Medicine

## 2022-09-09 ENCOUNTER — Other Ambulatory Visit: Payer: Medicare Other

## 2022-09-09 ENCOUNTER — Inpatient Hospital Stay: Payer: 59

## 2022-09-09 ENCOUNTER — Inpatient Hospital Stay: Payer: 59 | Attending: Internal Medicine

## 2022-09-09 ENCOUNTER — Ambulatory Visit: Payer: Medicare Other

## 2022-09-09 VITALS — BP 117/79 | HR 61 | Temp 98.6°F | Resp 20 | Wt 174.8 lb

## 2022-09-09 DIAGNOSIS — C3491 Malignant neoplasm of unspecified part of right bronchus or lung: Secondary | ICD-10-CM

## 2022-09-09 DIAGNOSIS — D701 Agranulocytosis secondary to cancer chemotherapy: Secondary | ICD-10-CM

## 2022-09-09 DIAGNOSIS — T451X5A Adverse effect of antineoplastic and immunosuppressive drugs, initial encounter: Secondary | ICD-10-CM

## 2022-09-09 DIAGNOSIS — Z5112 Encounter for antineoplastic immunotherapy: Secondary | ICD-10-CM | POA: Diagnosis not present

## 2022-09-09 DIAGNOSIS — C3411 Malignant neoplasm of upper lobe, right bronchus or lung: Secondary | ICD-10-CM | POA: Diagnosis not present

## 2022-09-09 DIAGNOSIS — D649 Anemia, unspecified: Secondary | ICD-10-CM | POA: Insufficient documentation

## 2022-09-09 DIAGNOSIS — I10 Essential (primary) hypertension: Secondary | ICD-10-CM | POA: Insufficient documentation

## 2022-09-09 DIAGNOSIS — D6481 Anemia due to antineoplastic chemotherapy: Secondary | ICD-10-CM | POA: Diagnosis not present

## 2022-09-09 LAB — CBC WITH DIFFERENTIAL/PLATELET
Abs Immature Granulocytes: 0.01 10*3/uL (ref 0.00–0.07)
Basophils Absolute: 0.1 10*3/uL (ref 0.0–0.1)
Basophils Relative: 2 %
Eosinophils Absolute: 0.1 10*3/uL (ref 0.0–0.5)
Eosinophils Relative: 5 %
HCT: 34.3 % — ABNORMAL LOW (ref 39.0–52.0)
Hemoglobin: 11.7 g/dL — ABNORMAL LOW (ref 13.0–17.0)
Immature Granulocytes: 0 %
Lymphocytes Relative: 24 %
Lymphs Abs: 0.7 10*3/uL (ref 0.7–4.0)
MCH: 31.5 pg (ref 26.0–34.0)
MCHC: 34.1 g/dL (ref 30.0–36.0)
MCV: 92.2 fL (ref 80.0–100.0)
Monocytes Absolute: 0.5 10*3/uL (ref 0.1–1.0)
Monocytes Relative: 16 %
Neutro Abs: 1.5 10*3/uL — ABNORMAL LOW (ref 1.7–7.7)
Neutrophils Relative %: 53 %
Platelets: 379 10*3/uL (ref 150–400)
RBC: 3.72 MIL/uL — ABNORMAL LOW (ref 4.22–5.81)
RDW: 14.9 % (ref 11.5–15.5)
WBC: 2.9 10*3/uL — ABNORMAL LOW (ref 4.0–10.5)
nRBC: 0 % (ref 0.0–0.2)

## 2022-09-09 LAB — COMPREHENSIVE METABOLIC PANEL
ALT: 21 U/L (ref 0–44)
AST: 19 U/L (ref 15–41)
Albumin: 3.6 g/dL (ref 3.5–5.0)
Alkaline Phosphatase: 93 U/L (ref 38–126)
Anion gap: 5 (ref 5–15)
BUN: 8 mg/dL (ref 8–23)
CO2: 28 mmol/L (ref 22–32)
Calcium: 8.6 mg/dL — ABNORMAL LOW (ref 8.9–10.3)
Chloride: 103 mmol/L (ref 98–111)
Creatinine, Ser: 0.51 mg/dL — ABNORMAL LOW (ref 0.61–1.24)
GFR, Estimated: >60 mL/min (ref >60)
Glucose, Bld: 95 mg/dL (ref 70–99)
Potassium: 4 mmol/L (ref 3.5–5.1)
Sodium: 136 mmol/L (ref 135–145)
Total Bilirubin: 0.3 mg/dL (ref 0.3–1.2)
Total Protein: 7.3 g/dL (ref 6.5–8.1)

## 2022-09-09 MED ORDER — HEPARIN SOD (PORK) LOCK FLUSH 100 UNIT/ML IV SOLN
500.0000 [IU] | Freq: Once | INTRAVENOUS | Status: AC | PRN
  Administered 2022-09-09: 500 [IU]
  Filled 2022-09-09: qty 5

## 2022-09-09 MED ORDER — SODIUM CHLORIDE 0.9 % IV SOLN
Freq: Once | INTRAVENOUS | Status: AC
  Filled 2022-09-09: qty 250

## 2022-09-09 MED ORDER — SODIUM CHLORIDE 0.9 % IV SOLN
1500.0000 mg | Freq: Once | INTRAVENOUS | Status: AC
  Administered 2022-09-09: 1500 mg via INTRAVENOUS
  Filled 2022-09-09: qty 30

## 2022-09-09 NOTE — Progress Notes (Signed)
Patient has no concerns 

## 2022-09-09 NOTE — Patient Instructions (Signed)
[facility]  Discharge Instructions: Thank you for choosing [facility] to provide your oncology and hematology care.  If you have a lab appointment with the Cancer Center, please go directly to the Cancer Center and check in at the registration area.  Wear comfortable clothing and clothing appropriate for easy access to any Portacath or PICC line.   We strive to give you quality time with your provider. You may need to reschedule your appointment if you arrive late (15 or more minutes).  Arriving late affects you and other patients whose appointments are after yours.  Also, if you miss three or more appointments without notifying the office, you may be dismissed from the clinic at the provider's discretion.      For prescription refill requests, have your pharmacy contact our office and allow 72 hours for refills to be completed.    Today you received the following chemotherapy and/or immunotherapy agents Imfinzi      To help prevent nausea and vomiting after your treatment, we encourage you to take your nausea medication as directed.  BELOW ARE SYMPTOMS THAT SHOULD BE REPORTED IMMEDIATELY: *FEVER GREATER THAN 100.4 F (38 C) OR HIGHER *CHILLS OR SWEATING *NAUSEA AND VOMITING THAT IS NOT CONTROLLED WITH YOUR NAUSEA MEDICATION *UNUSUAL SHORTNESS OF BREATH *UNUSUAL BRUISING OR BLEEDING *URINARY PROBLEMS (pain or burning when urinating, or frequent urination) *BOWEL PROBLEMS (unusual diarrhea, constipation, pain near the anus) TENDERNESS IN MOUTH AND THROAT WITH OR WITHOUT PRESENCE OF ULCERS (sore throat, sores in mouth, or a toothache) UNUSUAL RASH, SWELLING OR PAIN  UNUSUAL VAGINAL DISCHARGE OR ITCHING   Items with * indicate a potential emergency and should be followed up as soon as possible or go to the Emergency Department if any problems should occur.  Please show the CHEMOTHERAPY ALERT CARD or IMMUNOTHERAPY ALERT CARD at check-in to the  Emergency Department and triage nurse.  Should you have questions after your visit or need to cancel or reschedule your appointment, please contact [facility]  [phone] and follow the prompts.  Office hours are 8:00 a.m. to 4:30 p.m. Monday - Friday. Please note that voicemails left after 4:00 p.m. may not be returned until the following business day.  We are closed weekends and major holidays. You have access to a nurse at all times for urgent questions. Please call the main number to the clinic [phone] and follow the prompts.  For any non-urgent questions, you may also contact your provider using MyChart. We now offer e-Visits for anyone 46 and older to request care online for non-urgent symptoms. For details visit [email or web address].   Also download the MyChart app! Go to the app store, search "MyChart", open the app, select [place], and log in with your MyChart username and password.

## 2022-09-09 NOTE — Progress Notes (Signed)
[facility] CONSULT NOTE  Patient Care Team: [name], [name] as PCP - General (Physician Assistant) [name], [facility] as Pharmacist (Pharmacist) [name], RN as Oncology Nurse Navigator   CANCER STAGING   Cancer Staging  Squamous cell lung cancer [facility]) Staging form: Lung, AJCC 7th Edition - Clinical: Stage IIIA (T4, N0, M0) - Signed by Michaelyn Barter, MD on 06/03/2022  CURRENT TREATMENT- Weekly Carboplatin and Taxol with RT completed on 07/27/2022.  Maintenance Durvalumab started 08/13/2022. Plan total 12 cycles.   ASSESSMENT & PLAN:  BABY STAIRS 72 y.o. 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  #RUL of lung SCCa, atleast Stage IIIA (cT4N0M0) #Encounter for antineoplastic chemotherapy -PET CT scan was personally reviewed.  There is a large 4 cm mass in the right upper lobe partially obstructing the right upper lobe bronchus, involving the hilum and subcarinal space with postobstructive pneumonia. No lymphadenopathy.   -s/p bronchoscopy with EBUS by Dr. Jayme Cloud on 05/12/2022. -MRI brain on 05/22/2022 personally reviewed.  No evidence of metastatic disease in the brain. Small enhancing foci in the right parietal and right occipital calvarium, which are indeterminate but could represent small osseous metastases. Attention on follow-up. - not a surgical candidate  -Completed 5 cycles of CarboTaxol and RT on 07/27/2022.  Cycle 5 required dose reduction and Zarxio support.  Cycle 6 was canceled due to persistent neutropenia.  Repeat CT chest done on 1/15 showed Showed near complete resolution of right upper lobe mass, persistent.  Obstructive atelectasis seen.  No adenopathy.  Patient had excellent response to the treatment.  He was seen today prior to cycle 2 of  durvalumab.  Labs reviewed and acceptable.  He continues to be leukopenic with neutropenia but recovering.  Will proceed with cycle 2 of durvalumab.  -Plan to repeat CT chest in 3 months.  Due in mid April.   #Chemotherapy and radiation induced neutropenia -Plan as above  #Mild anemia  -Likely related to chemotherapy.  We will continue to monitor.  # Access - port placed on 05/28/2022   Orders Placed This Encounter  Procedures   CBC with Differential    Standing Status:   Future    Standing Expiration Date:   10/08/2023   Comprehensive metabolic panel    Standing Status:   Future    Standing Expiration Date:   10/08/2023   T4    Standing Status:   Future    Standing Expiration Date:   10/08/2023   TSH    Standing Status:   Future    Standing Expiration Date:   10/08/2023   CBC with Differential    Standing Status:   Future    Standing Expiration Date:   11/05/2023   Comprehensive metabolic panel    Standing Status:   Future    Standing Expiration Date:   11/05/2023   RTC in 1 month with APP, labs, cycle 3 of durvalumab RTC in 2 months with Dr. Mervyn Skeeters, labs, cycle 4 of Durvalumab.  The total time spent in the appointment was 30 minutes encounter with patients including review of chart and various tests results, discussions about plan of care and coordination of care plan   All questions were answered. The patient knows to call the clinic with any problems, questions or concerns. No barriers to learning was  detected.  Michaelyn Barter, MD 1/18/202410:09 AM   HISTORY OF PRESENTING ILLNESS:  ASHELY JOSHUA 72 y.o. 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  Patient was seen today accompanied by [name].  Denies any cough, hemoptysis, shortness of breath and weight  loss. Remote smoker.  Quit in 2013. Has family history of lung cancer in father.  INTERVAL HISTORY-  Patient seen today prior to cycle 2 of durvalumab and to discuss CT scan results. Tolerating treatment well.  Patient reports feeling well. Patient denies fever, chills, nausea, vomiting, shortness of breath, cough, abdominal pain, bleeding, bowel or bladder issues. Energy level is good.  Appetite is good.  Denies any weight loss.   I have reviewed his chart and materials related to his cancer extensively and collaborated history with the patient. Summary of oncologic history is as follows: Oncology History Overview Note  History of left upper lobe lung SCC status post microwave thermal ablation in 2013 by Dr. Fredia Sorrow.  He was deemed not a surgical candidate and had transportation issues for radiation treatment.   Squamous cell lung cancer (HCC)  01/12/2022 Initial Diagnosis   Patient has been following with Dr. Welton Flakes of pulmonary for suspicious lung nodule. CT chest in March 2021 showed post ablation scarring in LUL not changed and increased atelectasis of RML and RUL. He did not follow through.   CT chest in 01/12/2022 showed  New abrupt and slightly irregular cut off of the right upper lobe bronchus with associated right upper lobe atelectasis and postobstructive changes. Findings are concerning for endobronchial malignancy    04/08/2022 PET scan   IMPRESSION: 1. Hypermetabolic central right upper lobe lung mass nearly occluding the right upper lobe bronchus, poorly delineated on the noncontrast CT images, measuring approximately 3.8 x 2.9 cm, compatible with primary bronchogenic malignancy. 2. Postobstructive pneumonia/atelectasis throughout the basilar right upper lobe. 3. No hypermetabolic metastatic thoracic adenopathy or distant metastatic disease. 4. Stable mild post ablation change in the left upper lobe with no metabolic evidence of local tumor recurrence in this  location. 5. Dilated 4.4 cm ascending thoracic aorta. Recommend annual imaging followup by CTA or MRA   05/12/2022 Procedure   Procedure was delayed due to miscommunication about holding Plavix. S/p bronchoscopy with EBUS by Dr. Jayme Cloud. Op note mentions significant bulky adenopathy in the subcarinal space and in the right hilum.    05/12/2022 Pathology Results   DIAGNOSIS:  A.  LUNG, RIGHT UPPER LOBE; ENB-ASSISTED BIOPSY:  - SQUAMOUS CELL CARCINOMA.   LUNG, RIGHT UPPER LOBE; EBUS-ASSISTED BRUSHING:  - POSITIVE FOR MALIGNANCY.  - SQUAMOUS CELL CARCINOMA IS PRESENT.   LUNG, RIGHT UPPER LOBE; EBUS-ASSISTED LAVAGE:  - POSITIVE FOR MALIGNANCY.  - SQUAMOUS CELL CARCINOMA IS PRESENT  SUBCARINAL SPACE, RIGHT; EBUS-ASSISTED FNA:  - POSITIVE FOR MALIGNANCY.  - SQUAMOUS CELL CARCINOMA IS PRESENT   05/18/2022 Cancer Staging   Staging form: Lung, AJCC 7th Edition - Clinical: Stage IIIA (T4, N0, M0) - Signed by Michaelyn Barter, MD on 06/03/2022   05/21/2022 Imaging   MRI brain  No evidence of metastatic disease in the brain. 2. Small enhancing foci in the right parietal and right occipital calvarium, which are indeterminate but could represent small osseous metastases. Attention on follow-up   06/03/2022 - 07/27/2022 Chemotherapy   Patient is on Treatment  Plan : LUNG Carboplatin + Paclitaxel + XRT q7d      08/03/2022 -  Chemotherapy   Patient is on Treatment Plan : LUNG NSCLC Durvalumab (1500) q28d        MEDICAL HISTORY:  Past Medical History:  Diagnosis Date   Alcoholism (HCC)    COPD (chronic obstructive pulmonary disease) (HCC)    SEVERE COPD -OCCASIONALLY USES OXYGEN AT NIGHT-DOES NOT USE INHALERS ON REGULAR BASIS   Elevated cholesterol    GERD (gastroesophageal reflux disease)    HOH (hard of hearing)    Hypertension    Lung cancer (HCC)    LEFT UPPER LUNG CARCINOMA-NOT A CANDIDATE FOR SURGICAL RESECTION BECAUSE OF HIS COPD AND POOR CANDIDATE FOR RADIATION BECAUSE OF  TRANSPORTATION PROBLEMS   Seizures (HCC)    CHRONIC DILATIN - PT STATES NO SEIZURES IN PAST COUPLE OF YEARS; PT STATES HIS SEIZURES CAUSED NUMBNESS OF ARMS AND HANDS AND NOT ABLE TO MOVE HIS ARMS   Stroke (HCC)    2 TO 3 YRS AGO-AFFECTED HIS SPEECH--ABLE TO AMBULATE WITHOUT ASSIST AND DOES YARD WORK.  DECREASED HEARING IN BOTH EARS SINCE STROKE.   Vitamin D deficiency     SURGICAL HISTORY: Past Surgical History:  Procedure Laterality Date   CAROTID SURGERY 2009 -LEFT     COLON POLYP EXCISION     COLON SURGERY     COLONOSCOPY WITH PROPOFOL N/A 04/18/2017   Procedure: COLONOSCOPY WITH PROPOFOL;  Surgeon: Scot Jun, MD;  Location: [facility] ENDOSCOPY;  Service: Endoscopy;  Laterality: N/A;   ESOPHAGOGASTRODUODENOSCOPY     IR GENERIC HISTORICAL  01/21/2016   IR RADIOLOGIST EVAL & MGMT 01/21/2016 Irish Lack, MD [facility] INTERV RAD   IR GENERIC HISTORICAL  10/22/2014   IR RADIOLOGIST EVAL & MGMT 10/22/2014 Irish Lack, MD [facility] INTERV RAD   IR IMAGING GUIDED PORT INSERTION  05/28/2022   IR RADIOLOGIST EVAL & MGMT  02/02/2017   IR RADIOLOGIST EVAL & MGMT  03/08/2018   percutaneous biopsy     VIDEO BRONCHOSCOPY WITH ENDOBRONCHIAL ULTRASOUND Right 05/12/2022   Procedure: VIDEO BRONCHOSCOPY WITH ENDOBRONCHIAL ULTRASOUND;  Surgeon: Salena Saner, MD;  Location: [facility] ORS;  Service: Cardiopulmonary;  Laterality: Right;    SOCIAL HISTORY: Social History   Socioeconomic History   Marital status: Single    Spouse name: Not on file   Number of children: Not on file   Years of education: Not on file   Highest education level: Not on file  Occupational History   Not on file  Tobacco Use   Smoking status: Former    Packs/day: 1.00    Years: 35.00    Total pack years: 35.00    Types: Cigarettes    Quit date: 12/22/2011    Years since quitting: 10.7   Smokeless tobacco: Never  Vaping Use   Vaping Use: Never used  Substance and Sexual Activity   Alcohol use: Yes    Comment:  occosionally    Drug use: No   Sexual activity: Not on file  Other Topics Concern   Not on file  Social History Narrative   Not on file   Social Determinants of Health   Financial Resource Strain: Low Risk  (11/28/2020)   Overall Financial Resource Strain (CARDIA)    Difficulty of Paying Living Expenses: Not hard at all  Food Insecurity: No Food Insecurity (06/15/2022)   Hunger Vital Sign    Worried About Running Out of Food in the Last Year: Never true  Ran Out of Food in the Last Year: Never true  Transportation Needs: No Transportation Needs (06/15/2022)   PRAPARE - Administrator, Civil Service (Medical): No    Lack of Transportation (Non-Medical): No  Physical Activity: Inactive (06/15/2022)   Exercise Vital Sign    Days of Exercise per Week: 0 days    Minutes of Exercise per Session: 0 min  Stress: Stress Concern Present (06/15/2022)   [employer] of Occupational Health - Occupational Stress Questionnaire    Feeling of Stress : To some extent  Social Connections: Socially Isolated (06/15/2022)   Social Connection and Isolation Panel [NHANES]    Frequency of Communication with Friends and Family: More than three times a week    Frequency of Social Gatherings with Friends and Family: More than three times a week    Attends Religious Services: Never    Database administrator or Organizations: No    Attends Banker Meetings: Never    Marital Status: Never married  Intimate Partner Violence: Unknown (06/15/2022)   Humiliation, Afraid, Rape, and Kick questionnaire    Fear of Current or Ex-Partner: No    Emotionally Abused: Not on file    Physically Abused: No    Sexually Abused: No    FAMILY HISTORY: Family History  Problem Relation Age of Onset   Colon polyps Sister     ALLERGIES:  has No Known Allergies.  MEDICATIONS:  Current Outpatient Medications  Medication Sig Dispense Refill   aspirin 81 MG tablet Take 81 mg by mouth daily.      atorvastatin (LIPITOR) 80 MG tablet Take 1 tablet (80 mg total) by mouth daily. 90 tablet 3   budesonide-formoterol (SYMBICORT) 160-4.5 MCG/ACT inhaler Inhale 1 puff into the lungs 2 (two) times daily. 1 each 5   Cholecalciferol (VITAMIN D) 2000 UNITS CAPS Take by mouth. VITAMIN D 3 2000 IU  ONE SOFTGEL DAILY  --OTC     clopidogrel (PLAVIX) 75 MG tablet Take 1 tablet (75 mg total) by mouth daily. 90 tablet 3   enalapril (VASOTEC) 20 MG tablet Take 1 tablet (20 mg total) by mouth daily. 90 tablet 3   ezetimibe (ZETIA) 10 MG tablet Take 1 tablet (10 mg total) by mouth daily. 90 tablet 3   Na Sulfate-K Sulfate-Mg Sulf 17.5-3.13-1.6 GM/177ML SOLN Take by mouth.     OXYGEN Inhale into the lungs. At night     pantoprazole (PROTONIX) 40 MG tablet Take 1 tablet (40 mg total) by mouth daily. IN AM 90 tablet 0   phenytoin (DILANTIN) 100 MG ER capsule Take 4 capsules (400 mg total) by mouth at bedtime. 450 capsule 1   No current facility-administered medications for this visit.   Facility-Administered Medications Ordered in Other Visits  Medication Dose Route Frequency Provider Last Rate Last Admin   durvalumab (IMFINZI) 1,500 mg in sodium chloride 0.9 % 100 mL chemo infusion  1,500 mg Intravenous Once Michaelyn Barter, MD       heparin lock flush 100 unit/mL  500 Units Intracatheter Once PRN Michaelyn Barter, MD        REVIEW OF SYSTEMS:   Pertinent information mentioned in HPI All other systems were reviewed with the patient and are negative.  PHYSICAL EXAMINATION: ECOG PERFORMANCE STATUS: 2 - Symptomatic, <50% confined to bed  Vitals:   09/09/22 0935  BP: 117/79  Pulse: 61  Resp: 20  Temp: 98.6 F (37 C)  SpO2: 100%    Filed Weights  09/09/22 0935  Weight: 174 lb 12.8 oz (79.3 kg)      GENERAL:alert, no distress and comfortable SKIN: skin color, texture, turgor are normal, no rashes or significant lesions EYES: normal, conjunctiva are pink and non-injected, sclera  clear OROPHARYNX:no exudate, no erythema and lips, buccal mucosa, and tongue normal  NECK: supple, thyroid normal size, non-tender, without nodularity LYMPH:  no palpable lymphadenopathy in the cervical, axillary or inguinal LUNGS: clear to auscultation and percussion with normal breathing effort HEART: regular rate & rhythm and no murmurs and no lower extremity edema ABDOMEN:abdomen soft, non-tender and normal bowel sounds Musculoskeletal:no cyanosis of digits and no clubbing  PSYCH: alert & oriented x 3 with fluent speech NEURO: no focal motor/sensory deficits  LABORATORY DATA:  I have reviewed the data as listed Lab Results  Component Value Date   WBC 2.9 (L) 09/09/2022   HGB 11.7 (L) 09/09/2022   HCT 34.3 (L) 09/09/2022   MCV 92.2 09/09/2022   PLT 379 09/09/2022   Recent Labs    08/12/22 0936 08/19/22 0931 09/09/22 0910  NA 138 138 136  K 4.1 3.9 4.0  CL 103 105 103  CO2 28 27 28   GLUCOSE 104* 94 95  BUN 7* 11 8  CREATININE 0.56* 0.47* 0.51*  CALCIUM 9.0 9.0 8.6*  GFRNONAA >60 >60 >60  PROT 7.3 7.3 7.3  ALBUMIN 3.8 3.8 3.6  AST 20 17 19   ALT 19 20 21   ALKPHOS 87 90 93  BILITOT 0.5 0.3 0.3    RADIOGRAPHIC STUDIES: I have personally reviewed the radiological images as listed and agreed with the findings in the report. CT Chest W Contrast  Result Date: 09/06/2022 CLINICAL DATA:  History of left upper lobe non-small cell lung cancer post ablation 05/05/2022. EXAM: CT CHEST WITH CONTRAST TECHNIQUE: Multidetector CT imaging of the chest was performed during intravenous contrast administration. RADIATION DOSE REDUCTION: This exam was performed according to the departmental dose-optimization program which includes automated exposure control, adjustment of the mA and/or kV according to patient size and/or use of iterative reconstruction technique. CONTRAST:  90mL OMNIPAQUE IOHEXOL 300 MG/ML  SOLN COMPARISON:  12/23/2021, CT abdomen 06/27/2016 FINDINGS: Cardiovascular: Right  IJ Port-A-Cath with tip over the SVC. Heart is normal size. There is calcified plaque over the left main and left anterior descending coronary arteries. Mild aneurysm of the ascending thoracic aorta measuring approximately 4.1 cm in AP diameter which is stable. Minimal calcified plaque over the thoracic aorta. Remaining vascular structures are unremarkable. Mediastinum/Nodes: No mediastinal or hilar adenopathy. Remaining mediastinal structures are unremarkable. Lungs/Pleura: Lungs are adequately inflated with mild emphysematous disease. Previously seen central obstructing right upper lobe bronchus mass is significantly smaller and difficult to identify to be able to measure. There is persistent significant focal narrowing of the proximal right upper lobe bronchus without obstruction. There is persistent postobstructive atelectasis right upper lobe. Interval improvement to near resolution of previously seen right upper lobe airspace opacification adjacent to the postobstructive atelectasis. Remainder of the right lung is unremarkable. Minimal stable right apical pleural thickening. Stable changes over the left upper lobe compatible patient's previous left upper lobe malignancy post ablation. Remainder of the left lung is clear. No effusion. Upper Abdomen: 1.3 cm hyperdense focus over the left lobe of the liver unchanged from 2017 and likely benign. Remainder of the liver is normal. Gallbladder is contracted. Minimal calcified plaque over the abdominal aorta which is normal in caliber. Subcentimeter hypodensity of the spleen unchanged from 2017. Stable calcification over the  portacaval region. Stable right adrenal gland. Mild prominence of the left adrenal gland without significant change. No acute findings. Musculoskeletal: No focal abnormality. IMPRESSION: 1. Significant interval decrease in size of previously seen central obstructing mass over the right upper lobe bronchus which is difficult to identify to allow  accurate measurement. Persistent postobstructive atelectasis right upper lobe. No adenopathy. 2. 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 3. Stable 1.3 cm hyperdense focus over the left lobe of the liver unchanged from 2017 and likely benign. 4. Stable mild prominence of the left adrenal gland without significant change from 2017. 5. Stable 4.1 cm ascending thoracic aortic aneurysm. Recommend annual imaging followup by CTA or MRA. This recommendation follows 2010 ACCF/AHA/AATS/ACR/ASA/SCA/SCAI/SIR/STS/SVM Guidelines for the Diagnosis and Management of Patients with Thoracic Aortic Disease. Circulation. 2010; 121: N629-B284. Aortic aneurysm NOS (ICD10-I71.9). 6. Atherosclerotic coronary artery disease. Atherosclerotic coronary artery disease. Aortic Atherosclerosis (ICD10-I70.0) and Emphysema (ICD10-J43.9). Electronically Signed   By: Elberta Fortis M.D.   On: 09/06/2022 12:38

## 2022-09-18 ENCOUNTER — Other Ambulatory Visit: Payer: Self-pay | Admitting: Physician Assistant

## 2022-09-18 DIAGNOSIS — K219 Gastro-esophageal reflux disease without esophagitis: Secondary | ICD-10-CM

## 2022-09-23 DIAGNOSIS — J449 Chronic obstructive pulmonary disease, unspecified: Secondary | ICD-10-CM | POA: Diagnosis not present

## 2022-09-29 ENCOUNTER — Other Ambulatory Visit: Payer: Medicare Other

## 2022-09-29 ENCOUNTER — Other Ambulatory Visit: Payer: Self-pay | Admitting: Physician Assistant

## 2022-09-29 DIAGNOSIS — Z8669 Personal history of other diseases of the nervous system and sense organs: Secondary | ICD-10-CM

## 2022-10-01 ENCOUNTER — Other Ambulatory Visit: Payer: Self-pay | Admitting: Physician Assistant

## 2022-10-01 DIAGNOSIS — Z8669 Personal history of other diseases of the nervous system and sense organs: Secondary | ICD-10-CM

## 2022-10-07 ENCOUNTER — Inpatient Hospital Stay: Payer: 59

## 2022-10-07 ENCOUNTER — Encounter: Payer: Self-pay | Admitting: Internal Medicine

## 2022-10-07 ENCOUNTER — Encounter: Payer: Self-pay | Admitting: Nurse Practitioner

## 2022-10-07 ENCOUNTER — Inpatient Hospital Stay: Payer: 59 | Attending: Internal Medicine

## 2022-10-07 ENCOUNTER — Inpatient Hospital Stay (HOSPITAL_BASED_OUTPATIENT_CLINIC_OR_DEPARTMENT_OTHER): Payer: 59 | Admitting: Nurse Practitioner

## 2022-10-07 ENCOUNTER — Other Ambulatory Visit: Payer: Self-pay

## 2022-10-07 VITALS — BP 110/72 | HR 71 | Temp 97.6°F | Wt 171.0 lb

## 2022-10-07 DIAGNOSIS — T451X5A Adverse effect of antineoplastic and immunosuppressive drugs, initial encounter: Secondary | ICD-10-CM

## 2022-10-07 DIAGNOSIS — C3411 Malignant neoplasm of upper lobe, right bronchus or lung: Secondary | ICD-10-CM | POA: Insufficient documentation

## 2022-10-07 DIAGNOSIS — C3491 Malignant neoplasm of unspecified part of right bronchus or lung: Secondary | ICD-10-CM

## 2022-10-07 DIAGNOSIS — D75839 Thrombocytosis, unspecified: Secondary | ICD-10-CM

## 2022-10-07 DIAGNOSIS — Z79620 Long term (current) use of immunosuppressive biologic: Secondary | ICD-10-CM | POA: Insufficient documentation

## 2022-10-07 DIAGNOSIS — R7989 Other specified abnormal findings of blood chemistry: Secondary | ICD-10-CM | POA: Diagnosis not present

## 2022-10-07 DIAGNOSIS — Z5112 Encounter for antineoplastic immunotherapy: Secondary | ICD-10-CM | POA: Diagnosis not present

## 2022-10-07 DIAGNOSIS — D6481 Anemia due to antineoplastic chemotherapy: Secondary | ICD-10-CM | POA: Diagnosis not present

## 2022-10-07 LAB — COMPREHENSIVE METABOLIC PANEL
ALT: 17 U/L (ref 0–44)
AST: 19 U/L (ref 15–41)
Albumin: 3.6 g/dL (ref 3.5–5.0)
Alkaline Phosphatase: 138 U/L — ABNORMAL HIGH (ref 38–126)
Anion gap: 9 (ref 5–15)
BUN: 10 mg/dL (ref 8–23)
CO2: 26 mmol/L (ref 22–32)
Calcium: 9.2 mg/dL (ref 8.9–10.3)
Chloride: 98 mmol/L (ref 98–111)
Creatinine, Ser: 0.51 mg/dL — ABNORMAL LOW (ref 0.61–1.24)
GFR, Estimated: >60 mL/min (ref >60)
Glucose, Bld: 101 mg/dL — ABNORMAL HIGH (ref 70–99)
Potassium: 4 mmol/L (ref 3.5–5.1)
Sodium: 133 mmol/L — ABNORMAL LOW (ref 135–145)
Total Bilirubin: 0.5 mg/dL (ref 0.3–1.2)
Total Protein: 7.5 g/dL (ref 6.5–8.1)

## 2022-10-07 LAB — TSH: TSH: 1.391 u[IU]/mL (ref 0.350–4.500)

## 2022-10-07 LAB — CBC WITH DIFFERENTIAL/PLATELET
Abs Immature Granulocytes: 0.02 10*3/uL (ref 0.00–0.07)
Basophils Absolute: 0.1 10*3/uL (ref 0.0–0.1)
Basophils Relative: 1 %
Eosinophils Absolute: 0.1 10*3/uL (ref 0.0–0.5)
Eosinophils Relative: 2 %
HCT: 35.3 % — ABNORMAL LOW (ref 39.0–52.0)
Hemoglobin: 12 g/dL — ABNORMAL LOW (ref 13.0–17.0)
Immature Granulocytes: 1 %
Lymphocytes Relative: 20 %
Lymphs Abs: 0.9 10*3/uL (ref 0.7–4.0)
MCH: 31.3 pg (ref 26.0–34.0)
MCHC: 34 g/dL (ref 30.0–36.0)
MCV: 91.9 fL (ref 80.0–100.0)
Monocytes Absolute: 0.7 10*3/uL (ref 0.1–1.0)
Monocytes Relative: 17 %
Neutro Abs: 2.6 10*3/uL (ref 1.7–7.7)
Neutrophils Relative %: 59 %
Platelets: 453 10*3/uL — ABNORMAL HIGH (ref 150–400)
RBC: 3.84 MIL/uL — ABNORMAL LOW (ref 4.22–5.81)
RDW: 12.7 % (ref 11.5–15.5)
WBC: 4.4 10*3/uL (ref 4.0–10.5)
nRBC: 0 % (ref 0.0–0.2)

## 2022-10-07 LAB — GAMMA GT: GGT: 71 U/L — ABNORMAL HIGH (ref 7–50)

## 2022-10-07 MED ORDER — SODIUM CHLORIDE 0.9 % IV SOLN
Freq: Once | INTRAVENOUS | Status: AC
  Filled 2022-10-07: qty 250

## 2022-10-07 MED ORDER — HEPARIN SOD (PORK) LOCK FLUSH 100 UNIT/ML IV SOLN
500.0000 [IU] | Freq: Once | INTRAVENOUS | Status: AC | PRN
  Administered 2022-10-07: 500 [IU]
  Filled 2022-10-07: qty 5

## 2022-10-07 MED ORDER — SODIUM CHLORIDE 0.9% FLUSH
10.0000 mL | INTRAVENOUS | Status: DC | PRN
  Filled 2022-10-07: qty 10

## 2022-10-07 MED ORDER — SODIUM CHLORIDE 0.9 % IV SOLN
1500.0000 mg | Freq: Once | INTRAVENOUS | Status: AC
  Administered 2022-10-07: 1500 mg via INTRAVENOUS
  Filled 2022-10-07: qty 30

## 2022-10-07 NOTE — Patient Instructions (Signed)
[place]  Discharge Instructions: Thank you for choosing [facility] to provide your oncology and hematology care.  If you have a lab appointment with the [facility], please go directly to the [facility] and check in at the registration area.  Wear comfortable clothing and clothing appropriate for easy access to any Portacath or PICC line.   We strive to give you quality time with your provider. You may need to reschedule your appointment if you arrive late (15 or more minutes).  Arriving late affects you and other patients whose appointments are after yours.  Also, if you miss three or more appointments without notifying the office, you may be dismissed from the clinic at the provider's discretion.      For prescription refill requests, have your pharmacy contact our office and allow 72 hours for refills to be completed.    Today you received the following chemotherapy and/or immunotherapy agents- Durvalumab      To help prevent nausea and vomiting after your treatment, we encourage you to take your nausea medication as directed.  BELOW ARE SYMPTOMS THAT SHOULD BE REPORTED IMMEDIATELY: *FEVER GREATER THAN 100.4 F (38 C) OR HIGHER *CHILLS OR SWEATING *NAUSEA AND VOMITING THAT IS NOT CONTROLLED WITH YOUR NAUSEA MEDICATION *UNUSUAL SHORTNESS OF BREATH *UNUSUAL BRUISING OR BLEEDING *URINARY PROBLEMS (pain or burning when urinating, or frequent urination) *BOWEL PROBLEMS (unusual diarrhea, constipation, pain near the anus) TENDERNESS IN MOUTH AND THROAT WITH OR WITHOUT PRESENCE OF ULCERS (sore throat, sores in mouth, or a toothache) UNUSUAL RASH, SWELLING OR PAIN  UNUSUAL VAGINAL DISCHARGE OR ITCHING   Items with * indicate a potential emergency and should be followed up as soon as possible or go to the Emergency Department if any problems should occur.  Please show the CHEMOTHERAPY ALERT CARD or IMMUNOTHERAPY ALERT CARD at check-in  to the Emergency Department and triage nurse.  Should you have questions after your visit or need to cancel or reschedule your appointment, please contact [facility]  [phone] and follow the prompts.  Office hours are 8:00 a.m. to 4:30 p.m. Monday - Friday. Please note that voicemails left after 4:00 p.m. may not be returned until the following business day.  We are closed weekends and major holidays. You have access to a nurse at all times for urgent questions. Please call the main number to the clinic [phone] and follow the prompts.  For any non-urgent questions, you may also contact your provider using MyChart. We now offer e-Visits for anyone 20 and older to request care online for non-urgent symptoms. For details visit [email or web address].   Also download the MyChart app! Go to the app store, search "MyChart", open the app, select [facility], and log in with your MyChart username and password.

## 2022-10-07 NOTE — Progress Notes (Signed)
[facility] CONSULT NOTE  Patient Care Team: [name], [name] as PCP - General (Physician Assistant) [name], [facility] as Pharmacist (Pharmacist) [name], RN as Oncology Nurse Navigator  CANCER STAGING   Cancer Staging  Squamous cell lung cancer [facility]) Staging form: Lung, AJCC 7th Edition - Clinical: Stage IIIA (T4, N0, M0) - Signed by Michaelyn Barter, MD on 06/03/2022  HISTORY OF PRESENTING ILLNESS:  Derek Webb 72 y.o. 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  Patient was seen today accompanied by [name].  Denies any cough, hemoptysis, shortness of breath and weight loss. Remote smoker.  Quit in 2013. Has family history of lung cancer in father.  Oncology History Overview Note  History of left upper lobe lung SCC status post microwave thermal ablation in 2013 by Dr. Fredia Sorrow.  He was deemed not a surgical candidate and had transportation issues for radiation treatment.   Squamous cell lung cancer (HCC)  01/12/2022 Initial Diagnosis   Patient has been following with Dr. Welton Flakes of pulmonary for suspicious lung nodule. CT chest in March 2021 showed post ablation scarring in LUL not changed and increased atelectasis of RML and RUL. He did not follow through.   CT chest in 01/12/2022 showed  New abrupt and slightly irregular cut off of the right upper lobe bronchus with associated right upper lobe atelectasis and postobstructive changes. Findings are concerning for endobronchial malignancy    04/08/2022 PET scan   IMPRESSION: 1. Hypermetabolic central right upper lobe lung mass nearly occluding the right upper lobe bronchus, poorly delineated on the noncontrast CT images, measuring approximately 3.8 x 2.9 cm, compatible with primary  bronchogenic malignancy. 2. Postobstructive pneumonia/atelectasis throughout the basilar right upper lobe. 3. No hypermetabolic metastatic thoracic adenopathy or distant metastatic disease. 4. Stable mild post ablation change in the left upper lobe with no metabolic evidence of local tumor recurrence in this location. 5. Dilated 4.4 cm ascending thoracic aorta. Recommend annual imaging followup by CTA or MRA   05/12/2022 Procedure   Procedure was delayed due to miscommunication about holding Plavix. S/p bronchoscopy with EBUS by Dr. Jayme Cloud. Op note mentions significant bulky adenopathy in the subcarinal space and in the right hilum.    05/12/2022 Pathology Results   DIAGNOSIS:  A.  LUNG, RIGHT UPPER LOBE; ENB-ASSISTED BIOPSY:  - SQUAMOUS CELL CARCINOMA.   LUNG, RIGHT UPPER LOBE; EBUS-ASSISTED BRUSHING:  - POSITIVE FOR MALIGNANCY.  - SQUAMOUS CELL CARCINOMA IS PRESENT.   LUNG, RIGHT UPPER LOBE; EBUS-ASSISTED LAVAGE:  - POSITIVE FOR MALIGNANCY.  - SQUAMOUS CELL CARCINOMA IS PRESENT  SUBCARINAL SPACE, RIGHT; EBUS-ASSISTED FNA:  - POSITIVE FOR MALIGNANCY.  - SQUAMOUS CELL CARCINOMA IS PRESENT   05/18/2022 Cancer Staging   Staging form: Lung, AJCC 7th Edition - Clinical: Stage IIIA (T4, N0, M0) - Signed by Michaelyn Barter, MD on 06/03/2022   05/21/2022 Imaging   MRI brain  No evidence of metastatic disease in the brain. 2. Small enhancing foci in the right parietal and right occipital calvarium, which are indeterminate but could represent small osseous metastases. Attention on follow-up   06/03/2022 - 07/27/2022 Chemotherapy   Patient is on Treatment Plan : LUNG Carboplatin + Paclitaxel + XRT q7d      08/03/2022 -  Chemotherapy   Patient is on Treatment Plan :  LUNG NSCLC Durvalumab (1500) q28d       INTERVAL HISTORY- Patient is 72 year old male with above history of lung cancer, currently receiving maintenance durvalumab who returns to clinic for consideration of cycle 3.  He continues to tolerate treatment well. Denies specific complaints. No cough, chest pain, shortness of breath. No abdominal pain. Eating and drinking normally. Denies weight loss. No nausea, vomiting, diarrhea, constipation, or rash.  MEDICAL HISTORY:  Past Medical History:  Diagnosis Date   Alcoholism (HCC)    COPD (chronic obstructive pulmonary disease) (HCC)    SEVERE COPD -OCCASIONALLY USES OXYGEN AT NIGHT-DOES NOT USE INHALERS ON REGULAR BASIS   Elevated cholesterol    GERD (gastroesophageal reflux disease)    HOH (hard of hearing)    Hypertension    Lung cancer (HCC)    LEFT UPPER LUNG CARCINOMA-NOT A CANDIDATE FOR SURGICAL RESECTION BECAUSE OF HIS COPD AND POOR CANDIDATE FOR RADIATION BECAUSE OF TRANSPORTATION PROBLEMS   Seizures (HCC)    CHRONIC DILATIN - PT STATES NO SEIZURES IN PAST COUPLE OF YEARS; PT STATES HIS SEIZURES CAUSED NUMBNESS OF ARMS AND HANDS AND NOT ABLE TO MOVE HIS ARMS   Stroke (HCC)    2 TO 3 YRS AGO-AFFECTED HIS SPEECH--ABLE TO AMBULATE WITHOUT ASSIST AND DOES YARD WORK.  DECREASED HEARING IN BOTH EARS SINCE STROKE.   Vitamin D deficiency     SURGICAL HISTORY: Past Surgical History:  Procedure Laterality Date   CAROTID SURGERY 2009 -LEFT     COLON POLYP EXCISION     COLON SURGERY     COLONOSCOPY WITH PROPOFOL N/A 04/18/2017   Procedure: COLONOSCOPY WITH PROPOFOL;  Surgeon: Scot Jun, MD;  Location: [facility] ENDOSCOPY;  Service: Endoscopy;  Laterality: N/A;   ESOPHAGOGASTRODUODENOSCOPY     IR GENERIC HISTORICAL  01/21/2016   IR RADIOLOGIST EVAL & MGMT 01/21/2016 Irish Lack, MD [facility] INTERV RAD   IR GENERIC HISTORICAL  10/22/2014   IR RADIOLOGIST EVAL & MGMT 10/22/2014 Irish Lack, MD [facility] INTERV RAD   IR IMAGING GUIDED PORT INSERTION  05/28/2022   IR RADIOLOGIST EVAL & MGMT  02/02/2017   IR RADIOLOGIST EVAL & MGMT  03/08/2018   percutaneous biopsy     VIDEO BRONCHOSCOPY WITH ENDOBRONCHIAL ULTRASOUND Right 05/12/2022   Procedure: VIDEO BRONCHOSCOPY  WITH ENDOBRONCHIAL ULTRASOUND;  Surgeon: Salena Saner, MD;  Location: [facility] ORS;  Service: Cardiopulmonary;  Laterality: Right;    SOCIAL HISTORY: Social History   Socioeconomic History   Marital status: Single    Spouse name: Not on file   Number of children: Not on file   Years of education: Not on file   Highest education level: Not on file  Occupational History   Not on file  Tobacco Use   Smoking status: Former    Packs/day: 1.00    Years: 35.00    Total pack years: 35.00    Types: Cigarettes    Quit date: 12/22/2011    Years since quitting: 10.8   Smokeless tobacco: Never  Vaping Use   Vaping Use: Never used  Substance and Sexual Activity   Alcohol use: Yes    Comment: occosionally    Drug use: No   Sexual activity: Not on file  Other Topics Concern   Not on file  Social History Narrative   Not on file   Social Determinants of Health   Financial Resource Strain: Low Risk  (11/28/2020)   Overall Financial Resource Strain (CARDIA)    Difficulty of Paying Living  Expenses: Not hard at all  Food Insecurity: No Food Insecurity (06/15/2022)   Hunger Vital Sign    Worried About Running Out of Food in the Last Year: Never true    Ran Out of Food in the Last Year: Never true  Transportation Needs: No Transportation Needs (06/15/2022)   PRAPARE - Administrator, Civil Service (Medical): No    Lack of Transportation (Non-Medical): No  Physical Activity: Inactive (06/15/2022)   Exercise Vital Sign    Days of Exercise per Week: 0 days    Minutes of Exercise per Session: 0 min  Stress: Stress Concern Present (06/15/2022)   [employer] of Occupational Health - Occupational Stress Questionnaire    Feeling of Stress : To some extent  Social Connections: Socially Isolated (06/15/2022)   Social Connection and Isolation Panel [NHANES]    Frequency of Communication with Friends and Family: More than three times a week    Frequency of Social Gatherings with  Friends and Family: More than three times a week    Attends Religious Services: Never    Database administrator or Organizations: No    Attends Banker Meetings: Never    Marital Status: Never married  Intimate Partner Violence: Unknown (06/15/2022)   Humiliation, Afraid, Rape, and Kick questionnaire    Fear of Current or Ex-Partner: No    Emotionally Abused: Not on file    Physically Abused: No    Sexually Abused: No    FAMILY HISTORY: Family History  Problem Relation Age of Onset   Colon polyps Sister     ALLERGIES:  has No Known Allergies.  MEDICATIONS:  Current Outpatient Medications  Medication Sig Dispense Refill   aspirin 81 MG tablet Take 81 mg by mouth daily.     atorvastatin (LIPITOR) 80 MG tablet Take 1 tablet (80 mg total) by mouth daily. 90 tablet 3   budesonide-formoterol (SYMBICORT) 160-4.5 MCG/ACT inhaler Inhale 1 puff into the lungs 2 (two) times daily. 1 each 5   Cholecalciferol (VITAMIN D) 2000 UNITS CAPS Take by mouth. VITAMIN D 3 2000 IU  ONE SOFTGEL DAILY  --OTC     clopidogrel (PLAVIX) 75 MG tablet Take 1 tablet (75 mg total) by mouth daily. 90 tablet 3   enalapril (VASOTEC) 20 MG tablet Take 1 tablet (20 mg total) by mouth daily. 90 tablet 3   ezetimibe (ZETIA) 10 MG tablet Take 1 tablet (10 mg total) by mouth daily. 90 tablet 3   Na Sulfate-K Sulfate-Mg Sulf 17.5-3.13-1.6 GM/177ML SOLN Take by mouth.     OXYGEN Inhale into the lungs. At night     pantoprazole (PROTONIX) 40 MG tablet Take 1 tablet (40 mg total) by mouth daily. IN AM 90 tablet 0   phenytoin (DILANTIN) 100 MG ER capsule Take 4 capsules (400 mg total) by mouth at bedtime. 360 capsule 0   No current facility-administered medications for this visit.    REVIEW OF SYSTEMS:   Review of Systems  Constitutional:  Negative for chills, fever, malaise/fatigue and weight loss.  HENT:  Negative for hearing loss, nosebleeds, sore throat and tinnitus.   Eyes:  Negative for blurred vision  and double vision.  Respiratory:  Negative for cough, hemoptysis, shortness of breath and wheezing.   Cardiovascular:  Negative for chest pain, palpitations and leg swelling.  Gastrointestinal:  Negative for abdominal pain, blood in stool, constipation, diarrhea, melena, nausea and vomiting.  Genitourinary:  Negative for dysuria and urgency.  Musculoskeletal:  Negative for back pain, falls, joint pain and myalgias.  Skin:  Negative for itching and rash.  Neurological:  Negative for dizziness, tingling, sensory change, loss of consciousness, weakness and headaches.  Endo/Heme/Allergies:  Negative for environmental allergies. Does not bruise/bleed easily.  Psychiatric/Behavioral:  Negative for depression. The patient is not nervous/anxious and does not have insomnia.     PHYSICAL EXAMINATION: ECOG PERFORMANCE STATUS: 1 - Symptomatic but completely ambulatory  Vitals:   10/07/22 1039  BP: 110/72  Pulse: 71  Temp: 97.6 F (36.4 C)   Filed Weights   10/07/22 1039  Weight: 171 lb (77.6 kg)   General: Well-developed, well-nourished, no acute distress. Accompanied.  Eyes: Pink conjunctiva, anicteric sclera. Lungs: Clear to auscultation bilaterally.  No audible wheezing or coughing Heart: Regular rate and rhythm.  Abdomen: Soft, nontender, nondistended.  Musculoskeletal: No edema, cyanosis, or clubbing. Neuro: Alert, answering all questions appropriately. Cranial nerves grossly intact. Skin: No rashes or petechiae noted. Psych: flat affect.   LABORATORY DATA:  I have reviewed the data as listed Lab Results  Component Value Date   WBC 4.4 10/07/2022   HGB 12.0 (L) 10/07/2022   HCT 35.3 (L) 10/07/2022   MCV 91.9 10/07/2022   PLT 453 (H) 10/07/2022   Recent Labs    08/19/22 0931 09/09/22 0910 10/07/22 1012  NA 138 136 133*  K 3.9 4.0 4.0  CL 105 103 98  CO2 27 28 26   GLUCOSE 94 95 101*  BUN 11 8 10   CREATININE 0.47* 0.51* 0.51*  CALCIUM 9.0 8.6* 9.2  GFRNONAA >60 >60  >60  PROT 7.3 7.3 7.5  ALBUMIN 3.8 3.6 3.6  AST 17 19 19   ALT 20 21 17   ALKPHOS 90 93 138*  BILITOT 0.3 0.3 0.5    RADIOGRAPHIC STUDIES: I have personally reviewed the radiological images as listed and agreed with the findings in the report.  09/06/22- CT Chest W Contrast IMPRESSION: 1. Significant interval decrease in size of previously seen central obstructing mass over the right upper lobe bronchus which is difficult to identify to allow accurate measurement. Persistent postobstructive atelectasis right upper lobe. No adenopathy.  2. 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  3. Stable 1.3 cm hyperdense focus over the left lobe of the liver unchanged from 2017 and likely benign. 4. Stable mild prominence of the left adrenal gland without significant change from 2017. 5. Stable 4.1 cm ascending thoracic aortic aneurysm. Recommend annual imaging followup by CTA or MRA. This recommendation follows 2010 ACCF/AHA/AATS/ACR/ASA/SCA/SCAI/SIR/STS/SVM Guidelines for the Diagnosis and Management of Patients with Thoracic Aortic Disease. Circulation. 2010; 121: A193-X902. Aortic aneurysm NOS (ICD10-I71.9). 6. Atherosclerotic coronary artery disease. Atherosclerotic coronary artery disease.   No results found.  CURRENT TREATMENT- Weekly Carboplatin and Taxol with RT completed on 07/27/2022.  Maintenance Durvalumab started 08/13/2022. Plan total 12 cycles.   ASSESSMENT & PLAN:  ALOISE SHELLMAN 72 y.o. 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  Squamous cell carcinoma of RUL, at least stage IIIA - [id number]. PET revealed large 4 cm mass partially obstructing the right upper lobe bronchus, involving the hilum and subcarinal space  with postobstructive pneumonia.  No lymphadenopathy.  He underwent bronchoscopy with EBUS with Dr. Jayme Cloud on 05/12/2022.  Pathology consistent with squamous  cell carcinoma.  MRI of the brain on 05/22/2022 was negative for metastatic disease.  Small enhancing foci in the right parietal and right occipital calvarium which is indeterminant but could possibly represent small osseous metastasis.  He was felt not to be a surgical candidate due to underlying COPD and ongoing transportation and care issues. He completed 5 cycles of carbo-taxol & radiation on 07/27/22. Complicated by neutropenia. 09/06/22 imaging showed near resolution of RUL mass. Currently receiving maintenance durvalumab every 4 weeks x 12 months. Currently s/p 2 cycles. Tolerating well. Plan to re-image in mid-April which will be coordinating by Dr Alan Ripper team.  Encounter for antineoplastic chemotherapy/immunotherapy- labs reviewed and acceptable for treatment. Proceed with cycle 3 of durvalumab today.  Abnormal LFTs- ast and alt normal. ALP elevated slightly at 138. GGT elevated. Suspect related to chemotherapy. Monitor.  Anemia- mild. Likely chemotherapy related. Hmg 12 today. Monitor.  Leukopenia- WBC has now normalized consistent with bone marrow recovery post chemotherapy.  Thrombocytosis- plt 453. Etiology unclear. Possibly reactive? IDA? Monitor.  Port-a-cath- port placed on 05/28/2022.   Disposition:  Plan for durvalumab today.  Rtc in 4 weeks for port/lab, Dr Alena Bills, cycle 4 of durvalumab- la  The total time spent in the appointment was 30 minutes encounter with patients including review of chart and various tests results, discussions about plan of care and coordination of care plan  All questions were answered. The patient knows to call the clinic with any problems, questions or concerns.   Alinda Dooms, NP 10/07/2022

## 2022-10-09 LAB — T4: T4, Total: 5.6 ug/dL (ref 4.5–12.0)

## 2022-10-22 DIAGNOSIS — J449 Chronic obstructive pulmonary disease, unspecified: Secondary | ICD-10-CM | POA: Diagnosis not present

## 2022-11-04 ENCOUNTER — Inpatient Hospital Stay: Payer: 59

## 2022-11-04 ENCOUNTER — Inpatient Hospital Stay: Payer: 59 | Admitting: Internal Medicine

## 2022-11-04 ENCOUNTER — Ambulatory Visit: Payer: 59 | Admitting: Oncology

## 2022-11-05 ENCOUNTER — Inpatient Hospital Stay: Payer: 59 | Attending: Internal Medicine

## 2022-11-05 ENCOUNTER — Inpatient Hospital Stay: Payer: 59

## 2022-11-05 ENCOUNTER — Encounter: Payer: Self-pay | Admitting: Internal Medicine

## 2022-11-05 ENCOUNTER — Inpatient Hospital Stay (HOSPITAL_BASED_OUTPATIENT_CLINIC_OR_DEPARTMENT_OTHER): Payer: 59 | Admitting: Internal Medicine

## 2022-11-05 VITALS — BP 107/66 | HR 86 | Temp 98.6°F | Resp 19 | Wt 175.0 lb

## 2022-11-05 DIAGNOSIS — D649 Anemia, unspecified: Secondary | ICD-10-CM | POA: Insufficient documentation

## 2022-11-05 DIAGNOSIS — C3491 Malignant neoplasm of unspecified part of right bronchus or lung: Secondary | ICD-10-CM

## 2022-11-05 DIAGNOSIS — Z5112 Encounter for antineoplastic immunotherapy: Secondary | ICD-10-CM

## 2022-11-05 DIAGNOSIS — D6481 Anemia due to antineoplastic chemotherapy: Secondary | ICD-10-CM

## 2022-11-05 DIAGNOSIS — T451X5A Adverse effect of antineoplastic and immunosuppressive drugs, initial encounter: Secondary | ICD-10-CM | POA: Diagnosis not present

## 2022-11-05 DIAGNOSIS — C3411 Malignant neoplasm of upper lobe, right bronchus or lung: Secondary | ICD-10-CM | POA: Diagnosis not present

## 2022-11-05 LAB — COMPREHENSIVE METABOLIC PANEL
ALT: 16 U/L (ref 0–44)
AST: 26 U/L (ref 15–41)
Albumin: 3.5 g/dL (ref 3.5–5.0)
Alkaline Phosphatase: 162 U/L — ABNORMAL HIGH (ref 38–126)
Anion gap: 9 (ref 5–15)
BUN: 9 mg/dL (ref 8–23)
CO2: 25 mmol/L (ref 22–32)
Calcium: 9.2 mg/dL (ref 8.9–10.3)
Chloride: 100 mmol/L (ref 98–111)
Creatinine, Ser: 0.66 mg/dL (ref 0.61–1.24)
GFR, Estimated: >60 mL/min (ref >60)
Glucose, Bld: 112 mg/dL — ABNORMAL HIGH (ref 70–99)
Potassium: 4.4 mmol/L (ref 3.5–5.1)
Sodium: 134 mmol/L — ABNORMAL LOW (ref 135–145)
Total Bilirubin: 0.4 mg/dL (ref 0.3–1.2)
Total Protein: 8 g/dL (ref 6.5–8.1)

## 2022-11-05 LAB — CBC WITH DIFFERENTIAL/PLATELET
Abs Immature Granulocytes: 0.03 10*3/uL (ref 0.00–0.07)
Basophils Absolute: 0 10*3/uL (ref 0.0–0.1)
Basophils Relative: 1 %
Eosinophils Absolute: 0 10*3/uL (ref 0.0–0.5)
Eosinophils Relative: 0 %
HCT: 34.9 % — ABNORMAL LOW (ref 39.0–52.0)
Hemoglobin: 12 g/dL — ABNORMAL LOW (ref 13.0–17.0)
Immature Granulocytes: 1 %
Lymphocytes Relative: 10 %
Lymphs Abs: 0.6 10*3/uL — ABNORMAL LOW (ref 0.7–4.0)
MCH: 31.6 pg (ref 26.0–34.0)
MCHC: 34.4 g/dL (ref 30.0–36.0)
MCV: 91.8 fL (ref 80.0–100.0)
Monocytes Absolute: 0.6 10*3/uL (ref 0.1–1.0)
Monocytes Relative: 11 %
Neutro Abs: 4.4 10*3/uL (ref 1.7–7.7)
Neutrophils Relative %: 77 %
Platelets: 497 10*3/uL — ABNORMAL HIGH (ref 150–400)
RBC: 3.8 MIL/uL — ABNORMAL LOW (ref 4.22–5.81)
RDW: 12.5 % (ref 11.5–15.5)
WBC: 5.7 10*3/uL (ref 4.0–10.5)
nRBC: 0 % (ref 0.0–0.2)

## 2022-11-05 MED ORDER — SODIUM CHLORIDE 0.9% FLUSH
10.0000 mL | INTRAVENOUS | Status: DC | PRN
  Administered 2022-11-05: 10 mL
  Filled 2022-11-05: qty 10

## 2022-11-05 MED ORDER — HEPARIN SOD (PORK) LOCK FLUSH 100 UNIT/ML IV SOLN
500.0000 [IU] | Freq: Once | INTRAVENOUS | Status: AC | PRN
  Administered 2022-11-05: 500 [IU]
  Filled 2022-11-05: qty 5

## 2022-11-05 MED ORDER — SODIUM CHLORIDE 0.9 % IV SOLN
Freq: Once | INTRAVENOUS | Status: AC
  Filled 2022-11-05: qty 250

## 2022-11-05 MED ORDER — SODIUM CHLORIDE 0.9 % IV SOLN
1500.0000 mg | Freq: Once | INTRAVENOUS | Status: AC
  Administered 2022-11-05: 1500 mg via INTRAVENOUS
  Filled 2022-11-05: qty 30

## 2022-11-05 NOTE — Progress Notes (Signed)
[facility] CONSULT NOTE  Patient Care Team: [name], Ricki Rodriguez as PCP - General (Physician Assistant) Alena Bills, [facility] as Pharmacist (Pharmacist) Telford Nab, RN as Oncology Nurse Navigator   CANCER STAGING   Cancer Staging  Squamous cell lung cancer [facility]) Staging form: Lung, AJCC 7th Edition - Clinical: Stage IIIA (T4, N0, M0) - Signed by [name], MD on 06/03/2022  CURRENT TREATMENT- Weekly Carboplatin and Taxol with RT completed on 07/27/2022.  Maintenance Durvalumab started 08/13/2022. Plan total 12 cycles.   ASSESSMENT & PLAN:  Derek Webb 72 y.o. 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  #RUL of lung SCCa, atleast Stage IIIA (cT4N0M0) #Encounter for antineoplastic chemotherapy -s/p bronchoscopy with EBUS by Dr. Patsey Berthold on 05/12/2022.  -Completed 5 cycles of CarboTaxol and RT on 07/27/2022. Cycle 6 was canceled due to persistent neutropenia.  Repeat CT chest done on 1/15 showed near complete resolution of right upper lobe mass, persistent.  Obstructive atelectasis seen.  No adenopathy.  Patient had excellent response to the treatment.    -Now on maintenance durvalumab for total of 1 year.  Labs reviewed.  ALP mildly elevated.  AST and ALT are normal.  I will proceed with cycle 4 of Durvalumab as planned.  -Repeat CT chest scheduled for April 9.  #Mild anemia  -Likely related to chemotherapy.   -B12 and folate were normal from January 2024.  I will add iron studies with next blood work.  # Access - port placed on 05/28/2022   Orders Placed This Encounter  Procedures   Iron and TIBC    Standing Status:   Future    Standing Expiration Date:   11/05/2023   Ferritin    Standing Status:   Future    Standing Expiration Date:   11/05/2023    RTC in 1 month for MD visit, labs, cycle 5 of Durvalumab  The total time spent in the appointment was 30 minutes encounter with patients including review of chart and various tests results, discussions about plan of care and coordination of care plan   All questions were answered. The patient knows to call the clinic with any problems, questions or concerns. No barriers to learning was detected.  [name], MD 3/15/202411:20 AM   HISTORY OF PRESENTING ILLNESS:  Derek Webb 72 y.o. 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  Patient was seen today accompanied by her Sister [name].  Denies any cough, hemoptysis, shortness of breath and weight loss. Remote smoker.  Quit in 2013. Has family history of lung cancer in father.  INTERVAL HISTORY-  Patient seen today prior to cycle 4 of Durvalumab.  Accompanied by sister. He is tolerating treatments well. Patient denies fever, chills, nausea, vomiting, shortness of breath, cough, abdominal pain, bleeding, bowel or bladder issues. Energy level is good.  Appetite is good.  Denies any weight loss. Denies pain.   I have reviewed his chart and materials related to his cancer extensively and collaborated history with the patient. Summary of oncologic history is as follows: Oncology History Overview Note  History of left upper lobe lung SCC status post microwave thermal  ablation in 2013 by Dr. Kathlene Cote.  He was deemed not a surgical candidate and had transportation issues for radiation treatment.   Squamous cell lung cancer ([place])  01/12/2022 Initial Diagnosis   Patient has been following with Dr. Humphrey Rolls of pulmonary for suspicious lung nodule. CT chest in March 2021 showed post ablation scarring in LUL not changed and increased  atelectasis of RML and RUL. He did not follow through.   CT chest in 01/12/2022 showed  New abrupt and slightly irregular cut off of the right upper lobe bronchus with associated right upper lobe atelectasis and postobstructive changes. Findings are concerning for endobronchial malignancy    04/08/2022 PET scan   IMPRESSION: 1. Hypermetabolic central right upper lobe lung mass nearly occluding the right upper lobe bronchus, poorly delineated on the noncontrast CT images, measuring approximately 3.8 x 2.9 cm, compatible with primary bronchogenic malignancy. 2. Postobstructive pneumonia/atelectasis throughout the basilar right upper lobe. 3. No hypermetabolic metastatic thoracic adenopathy or distant metastatic disease. 4. Stable mild post ablation change in the left upper lobe with no metabolic evidence of local tumor recurrence in this location. 5. Dilated 4.4 cm ascending thoracic aorta. Recommend annual imaging followup by CTA or MRA   05/12/2022 Procedure   Procedure was delayed due to miscommunication about holding Plavix. S/p bronchoscopy with EBUS by Dr. Patsey Berthold. Op note mentions significant bulky adenopathy in the subcarinal space and in the right hilum.    05/12/2022 Pathology Results   DIAGNOSIS:  A.  LUNG, RIGHT UPPER LOBE; ENB-ASSISTED BIOPSY:  - SQUAMOUS CELL CARCINOMA.   LUNG, RIGHT UPPER LOBE; EBUS-ASSISTED BRUSHING:  - POSITIVE FOR MALIGNANCY.  - SQUAMOUS CELL CARCINOMA IS PRESENT.   LUNG, RIGHT UPPER LOBE; EBUS-ASSISTED LAVAGE:  - POSITIVE FOR MALIGNANCY.  - SQUAMOUS CELL CARCINOMA IS PRESENT  SUBCARINAL SPACE, RIGHT; EBUS-ASSISTED FNA:  - POSITIVE FOR MALIGNANCY.  - SQUAMOUS CELL CARCINOMA IS PRESENT   05/18/2022 Cancer Staging   Staging form: Lung, AJCC 7th Edition - Clinical: Stage IIIA (T4, N0, M0) - Signed by [name], MD on 06/03/2022   05/21/2022 Imaging   MRI brain  No evidence of metastatic disease in the brain. 2. Small enhancing foci in  the right parietal and right occipital calvarium, which are indeterminate but could represent small osseous metastases. Attention on follow-up   06/03/2022 - 07/27/2022 Chemotherapy   Patient is on Treatment Plan : LUNG Carboplatin + Paclitaxel + XRT q7d      08/03/2022 -  Chemotherapy   Patient is on Treatment Plan : LUNG NSCLC Durvalumab (1500) q28d        MEDICAL HISTORY:  Past Medical History:  Diagnosis Date   Alcoholism ([place])    COPD (chronic obstructive pulmonary disease) (HCC)    SEVERE COPD -OCCASIONALLY USES OXYGEN AT NIGHT-DOES NOT USE INHALERS ON REGULAR BASIS   Elevated cholesterol    GERD (gastroesophageal reflux disease)    HOH (hard of hearing)    Hypertension    Lung cancer ([place])    LEFT UPPER LUNG CARCINOMA-NOT A CANDIDATE FOR SURGICAL RESECTION BECAUSE OF HIS COPD AND POOR CANDIDATE FOR RADIATION BECAUSE OF TRANSPORTATION PROBLEMS   Seizures ([place])    CHRONIC DILATIN - PT STATES NO SEIZURES IN PAST COUPLE OF YEARS; PT STATES HIS SEIZURES CAUSED NUMBNESS OF ARMS AND HANDS AND NOT ABLE TO MOVE HIS ARMS   Stroke ([place])    2 TO 3 YRS AGO-AFFECTED HIS SPEECH--ABLE TO AMBULATE WITHOUT ASSIST AND DOES YARD WORK.  DECREASED HEARING IN BOTH EARS  SINCE STROKE.   Vitamin D deficiency     SURGICAL HISTORY: Past Surgical History:  Procedure Laterality Date   CAROTID SURGERY 2009 -LEFT     COLON POLYP EXCISION     COLON SURGERY     COLONOSCOPY WITH PROPOFOL N/A 04/18/2017   Procedure: COLONOSCOPY WITH PROPOFOL;  Surgeon: Manya Silvas, MD;  Location: [facility] ENDOSCOPY;  Service: Endoscopy;  Laterality: N/A;   ESOPHAGOGASTRODUODENOSCOPY     IR GENERIC HISTORICAL  01/21/2016   IR RADIOLOGIST EVAL & MGMT 01/21/2016 Aletta Edouard, MD [facility] INTERV RAD   IR GENERIC HISTORICAL  10/22/2014   IR RADIOLOGIST EVAL & MGMT 10/22/2014 Aletta Edouard, MD [facility] INTERV RAD   IR IMAGING GUIDED PORT INSERTION  05/28/2022   IR RADIOLOGIST EVAL & MGMT  02/02/2017   IR RADIOLOGIST EVAL & MGMT   03/08/2018   percutaneous biopsy     VIDEO BRONCHOSCOPY WITH ENDOBRONCHIAL ULTRASOUND Right 05/12/2022   Procedure: VIDEO BRONCHOSCOPY WITH ENDOBRONCHIAL ULTRASOUND;  Surgeon: Tyler Pita, MD;  Location: [facility] ORS;  Service: Cardiopulmonary;  Laterality: Right;    SOCIAL HISTORY: Social History   Socioeconomic History   Marital status: Single    Spouse name: Not on file   Number of children: Not on file   Years of education: Not on file   Highest education level: Not on file  Occupational History   Not on file  Tobacco Use   Smoking status: Former    Packs/day: 1.00    Years: 35.00    Additional pack years: 0.00    Total pack years: 35.00    Types: Cigarettes    Quit date: 12/22/2011    Years since quitting: 10.8   Smokeless tobacco: Never  Vaping Use   Vaping Use: Never used  Substance and Sexual Activity   Alcohol use: Yes    Comment: occosionally    Drug use: No   Sexual activity: Not on file  Other Topics Concern   Not on file  Social History Narrative   Not on file   Social Determinants of Health   Financial Resource Strain: Low Risk  (11/28/2020)   Overall Financial Resource Strain (CARDIA)    Difficulty of Paying Living Expenses: Not hard at all  Food Insecurity: No Food Insecurity (06/15/2022)   Hunger Vital Sign    Worried About Running Out of Food in the Last Year: Never true    [place] in the Last Year: Never true  Transportation Needs: No Transportation Needs (06/15/2022)   PRAPARE - Hydrologist (Medical): No    Lack of Transportation (Non-Medical): No  Physical Activity: Inactive (06/15/2022)   Exercise Vital Sign    Days of Exercise per Week: 0 days    Minutes of Exercise per Session: 0 min  Stress: Stress Concern Present (06/15/2022)   [place]    Feeling of Stress : To some extent  Social Connections: Socially Isolated (06/15/2022)    Social Connection and Isolation Panel [NHANES]    Frequency of Communication with Friends and Family: More than three times a week    Frequency of Social Gatherings with Friends and Family: More than three times a week    Attends Religious Services: Never    Marine scientist or Organizations: No    Attends Archivist Meetings: Never    Marital Status: Never married  Intimate Partner Violence: Unknown (06/15/2022)   Humiliation, Afraid,  Rape, and Kick questionnaire    Fear of Current or Ex-Partner: No    Emotionally Abused: Not on file    Physically Abused: No    Sexually Abused: No    FAMILY HISTORY: Family History  Problem Relation Age of Onset   Colon polyps Sister     ALLERGIES:  has No Known Allergies.  MEDICATIONS:  Current Outpatient Medications  Medication Sig Dispense Refill   aspirin 81 MG tablet Take 81 mg by mouth daily.     atorvastatin (LIPITOR) 80 MG tablet Take 1 tablet (80 mg total) by mouth daily. 90 tablet 3   budesonide-formoterol (SYMBICORT) 160-4.5 MCG/ACT inhaler Inhale 1 puff into the lungs 2 (two) times daily. 1 each 5   Cholecalciferol (VITAMIN D) 2000 UNITS CAPS Take by mouth. VITAMIN D 3 2000 IU  ONE SOFTGEL DAILY  --OTC     clopidogrel (PLAVIX) 75 MG tablet Take 1 tablet (75 mg total) by mouth daily. 90 tablet 3   enalapril (VASOTEC) 20 MG tablet Take 1 tablet (20 mg total) by mouth daily. 90 tablet 3   ezetimibe (ZETIA) 10 MG tablet Take 1 tablet (10 mg total) by mouth daily. 90 tablet 3   Na Sulfate-K Sulfate-Mg Sulf 17.5-3.13-1.6 GM/177ML SOLN Take by mouth.     OXYGEN Inhale into the lungs. At night     pantoprazole (PROTONIX) 40 MG tablet Take 1 tablet (40 mg total) by mouth daily. IN AM 90 tablet 0   phenytoin (DILANTIN) 100 MG ER capsule Take 4 capsules (400 mg total) by mouth at bedtime. 360 capsule 0   No current facility-administered medications for this visit.   Facility-Administered Medications Ordered in Other Visits   Medication Dose Route Frequency Provider Last Rate Last Admin   durvalumab (IMFINZI) 1,500 mg in sodium chloride 0.9 % 100 mL chemo infusion  1,500 mg Intravenous Once [name], MD       heparin lock flush 100 unit/mL  500 Units Intracatheter Once PRN [name], MD       sodium chloride flush (NS) 0.9 % injection 10 mL  10 mL Intracatheter PRN [name], MD        REVIEW OF SYSTEMS:   Pertinent information mentioned in HPI All other systems were reviewed with the patient and are negative.  PHYSICAL EXAMINATION: ECOG PERFORMANCE STATUS: 2 - Symptomatic, <50% confined to bed  Vitals:   11/05/22 1028  BP: 107/66  Pulse: 86  Resp: 19  Temp: 98.6 F (37 C)  SpO2: 97%    Filed Weights   11/05/22 1028  Weight: 175 lb (79.4 kg)      GENERAL:alert, no distress and comfortable SKIN: skin color, texture, turgor are normal, no rashes or significant lesions EYES: normal, conjunctiva are pink and non-injected, sclera clear OROPHARYNX:no exudate, no erythema and lips, buccal mucosa, and tongue normal  NECK: supple, thyroid normal size, non-tender, without nodularity LYMPH:  no palpable lymphadenopathy in the cervical, axillary or inguinal LUNGS: clear to auscultation and percussion with normal breathing effort HEART: regular rate & rhythm and no murmurs and no lower extremity edema ABDOMEN:abdomen soft, non-tender and normal bowel sounds Musculoskeletal:no cyanosis of digits and no clubbing  PSYCH: alert & oriented x 3 with fluent speech NEURO: no focal motor/sensory deficits  LABORATORY DATA:  I have reviewed the data as listed Lab Results  Component Value Date   WBC 5.7 11/05/2022   HGB 12.0 (L) 11/05/2022   HCT 34.9 (L) 11/05/2022   MCV 91.8 11/05/2022  PLT 497 (H) 11/05/2022   Recent Labs    09/09/22 0910 10/07/22 1012 11/05/22 1011  NA 136 133* 134*  K 4.0 4.0 4.4  CL 103 98 100  CO2 28 26 25   GLUCOSE 95 101* 112*  BUN 8 10 9   CREATININE  0.51* 0.51* 0.66  CALCIUM 8.6* 9.2 9.2  GFRNONAA >60 >60 >60  PROT 7.3 7.5 8.0  ALBUMIN 3.6 3.6 3.5  AST 19 19 26   ALT 21 17 16   ALKPHOS 93 138* 162*  BILITOT 0.3 0.5 0.4    RADIOGRAPHIC STUDIES: I have personally reviewed the radiological images as listed and agreed with the findings in the report. No results found.

## 2022-11-05 NOTE — Patient Instructions (Signed)
[facility] [place] [facility]  Discharge Instructions: Thank you for choosing [facility] to provide your oncology and hematology care.  If you have a lab appointment with the Cancer Center, please go directly to the Cancer Center and check in at the registration area.  Wear comfortable clothing and clothing appropriate for easy access to any Portacath or PICC line.   We strive to give you quality time with your provider. You may need to reschedule your appointment if you arrive late (15 or more minutes).  Arriving late affects you and other patients whose appointments are after yours.  Also, if you miss three or more appointments without notifying the office, you may be dismissed from the clinic at the provider's discretion.      For prescription refill requests, have your pharmacy contact our office and allow 72 hours for refills to be completed.    Today you received the following chemotherapy and/or immunotherapy agents- Durvalumab      To help prevent nausea and vomiting after your treatment, we encourage you to take your nausea medication as directed.  BELOW ARE SYMPTOMS THAT SHOULD BE REPORTED IMMEDIATELY: *FEVER GREATER THAN 100.4 F (38 C) OR HIGHER *CHILLS OR SWEATING *NAUSEA AND VOMITING THAT IS NOT CONTROLLED WITH YOUR NAUSEA MEDICATION *UNUSUAL SHORTNESS OF BREATH *UNUSUAL BRUISING OR BLEEDING *URINARY PROBLEMS (pain or burning when urinating, or frequent urination) *BOWEL PROBLEMS (unusual diarrhea, constipation, pain near the anus) TENDERNESS IN MOUTH AND THROAT WITH OR WITHOUT PRESENCE OF ULCERS (sore throat, sores in mouth, or a toothache) UNUSUAL RASH, SWELLING OR PAIN  UNUSUAL VAGINAL DISCHARGE OR ITCHING   Items with * indicate a potential emergency and should be followed up as soon as possible or go to the Emergency Department if any problems should occur.  Please show the CHEMOTHERAPY ALERT CARD or IMMUNOTHERAPY ALERT CARD at check-in  to the Emergency Department and triage nurse.  Should you have questions after your visit or need to cancel or reschedule your appointment, please contact [facility] [place] [facility]  [phone] and follow the prompts.  Office hours are 8:00 a.m. to 4:30 p.m. Monday - Friday. Please note that voicemails left after 4:00 p.m. may not be returned until the following business day.  We are closed weekends and major holidays. You have access to a nurse at all times for urgent questions. Please call the main number to the clinic [phone] and follow the prompts.  For any non-urgent questions, you may also contact your provider using MyChart. We now offer e-Visits for anyone 18 and older to request care online for non-urgent symptoms. For details visit [email or web address].   Also download the MyChart app! Go to the app store, search "MyChart", open the app, select [place], and log in with your MyChart username and password.    

## 2022-11-15 DIAGNOSIS — H35013 Changes in retinal vascular appearance, bilateral: Secondary | ICD-10-CM | POA: Diagnosis not present

## 2022-11-15 DIAGNOSIS — H25013 Cortical age-related cataract, bilateral: Secondary | ICD-10-CM | POA: Diagnosis not present

## 2022-11-15 DIAGNOSIS — H2513 Age-related nuclear cataract, bilateral: Secondary | ICD-10-CM | POA: Diagnosis not present

## 2022-11-15 DIAGNOSIS — H35033 Hypertensive retinopathy, bilateral: Secondary | ICD-10-CM | POA: Diagnosis not present

## 2022-11-22 DIAGNOSIS — J449 Chronic obstructive pulmonary disease, unspecified: Secondary | ICD-10-CM | POA: Diagnosis not present

## 2022-11-30 ENCOUNTER — Encounter: Payer: Self-pay | Admitting: Internal Medicine

## 2022-11-30 ENCOUNTER — Ambulatory Visit
Admission: RE | Admit: 2022-11-30 | Discharge: 2022-11-30 | Disposition: A | Discharge status: Home / Self Care | Payer: 59 | Source: Ambulatory Visit | Attending: Radiation Oncology | Admitting: Radiation Oncology

## 2022-11-30 DIAGNOSIS — C3490 Malignant neoplasm of unspecified part of unspecified bronchus or lung: Secondary | ICD-10-CM | POA: Diagnosis not present

## 2022-11-30 DIAGNOSIS — J432 Centrilobular emphysema: Secondary | ICD-10-CM | POA: Diagnosis not present

## 2022-11-30 MED ORDER — IOHEXOL 300 MG/ML  SOLN
75.0000 mL | Freq: Once | INTRAMUSCULAR | Status: AC | PRN
  Administered 2022-11-30: 75 mL via INTRAVENOUS

## 2022-12-02 ENCOUNTER — Other Ambulatory Visit: Payer: 59

## 2022-12-02 ENCOUNTER — Ambulatory Visit: Payer: 59 | Admitting: Internal Medicine

## 2022-12-02 ENCOUNTER — Ambulatory Visit: Payer: 59

## 2022-12-03 ENCOUNTER — Other Ambulatory Visit: Payer: Self-pay

## 2022-12-03 ENCOUNTER — Inpatient Hospital Stay: Payer: 59

## 2022-12-03 ENCOUNTER — Inpatient Hospital Stay: Payer: 59 | Attending: Internal Medicine

## 2022-12-03 ENCOUNTER — Encounter: Payer: Self-pay | Admitting: Internal Medicine

## 2022-12-03 ENCOUNTER — Inpatient Hospital Stay (HOSPITAL_BASED_OUTPATIENT_CLINIC_OR_DEPARTMENT_OTHER): Payer: 59 | Admitting: Internal Medicine

## 2022-12-03 VITALS — BP 116/74 | HR 77 | Temp 97.8°F | Resp 15 | Ht 67.0 in | Wt 174.8 lb

## 2022-12-03 DIAGNOSIS — E878 Other disorders of electrolyte and fluid balance, not elsewhere classified: Secondary | ICD-10-CM | POA: Insufficient documentation

## 2022-12-03 DIAGNOSIS — C3411 Malignant neoplasm of upper lobe, right bronchus or lung: Secondary | ICD-10-CM | POA: Insufficient documentation

## 2022-12-03 DIAGNOSIS — C3491 Malignant neoplasm of unspecified part of right bronchus or lung: Secondary | ICD-10-CM

## 2022-12-03 DIAGNOSIS — Z5112 Encounter for antineoplastic immunotherapy: Secondary | ICD-10-CM | POA: Diagnosis not present

## 2022-12-03 DIAGNOSIS — E871 Hypo-osmolality and hyponatremia: Secondary | ICD-10-CM | POA: Insufficient documentation

## 2022-12-03 DIAGNOSIS — K769 Liver disease, unspecified: Secondary | ICD-10-CM | POA: Diagnosis not present

## 2022-12-03 DIAGNOSIS — Z87891 Personal history of nicotine dependence: Secondary | ICD-10-CM | POA: Insufficient documentation

## 2022-12-03 DIAGNOSIS — Z8673 Personal history of transient ischemic attack (TIA), and cerebral infarction without residual deficits: Secondary | ICD-10-CM | POA: Diagnosis not present

## 2022-12-03 DIAGNOSIS — Z79620 Long term (current) use of immunosuppressive biologic: Secondary | ICD-10-CM | POA: Insufficient documentation

## 2022-12-03 DIAGNOSIS — D649 Anemia, unspecified: Secondary | ICD-10-CM | POA: Diagnosis not present

## 2022-12-03 DIAGNOSIS — I10 Essential (primary) hypertension: Secondary | ICD-10-CM | POA: Diagnosis not present

## 2022-12-03 DIAGNOSIS — J449 Chronic obstructive pulmonary disease, unspecified: Secondary | ICD-10-CM | POA: Diagnosis not present

## 2022-12-03 DIAGNOSIS — D6481 Anemia due to antineoplastic chemotherapy: Secondary | ICD-10-CM

## 2022-12-03 LAB — CBC WITH DIFFERENTIAL/PLATELET
Abs Immature Granulocytes: 0.02 10*3/uL (ref 0.00–0.07)
Basophils Absolute: 0.1 10*3/uL (ref 0.0–0.1)
Basophils Relative: 1 %
Eosinophils Absolute: 0.1 10*3/uL (ref 0.0–0.5)
Eosinophils Relative: 2 %
HCT: 37.5 % — ABNORMAL LOW (ref 39.0–52.0)
Hemoglobin: 13 g/dL (ref 13.0–17.0)
Immature Granulocytes: 0 %
Lymphocytes Relative: 18 %
Lymphs Abs: 0.9 10*3/uL (ref 0.7–4.0)
MCH: 30.4 pg (ref 26.0–34.0)
MCHC: 34.7 g/dL (ref 30.0–36.0)
MCV: 87.8 fL (ref 80.0–100.0)
Monocytes Absolute: 0.9 10*3/uL (ref 0.1–1.0)
Monocytes Relative: 19 %
Neutro Abs: 2.9 10*3/uL (ref 1.7–7.7)
Neutrophils Relative %: 60 %
Platelets: 541 10*3/uL — ABNORMAL HIGH (ref 150–400)
RBC: 4.27 MIL/uL (ref 4.22–5.81)
RDW: 12.5 % (ref 11.5–15.5)
WBC: 4.9 10*3/uL (ref 4.0–10.5)
nRBC: 0 % (ref 0.0–0.2)

## 2022-12-03 LAB — COMPREHENSIVE METABOLIC PANEL
ALT: 17 U/L (ref 0–44)
AST: 21 U/L (ref 15–41)
Albumin: 3.5 g/dL (ref 3.5–5.0)
Alkaline Phosphatase: 167 U/L — ABNORMAL HIGH (ref 38–126)
Anion gap: 6 (ref 5–15)
BUN: 10 mg/dL (ref 8–23)
CO2: 26 mmol/L (ref 22–32)
Calcium: 9 mg/dL (ref 8.9–10.3)
Chloride: 97 mmol/L — ABNORMAL LOW (ref 98–111)
Creatinine, Ser: 0.71 mg/dL (ref 0.61–1.24)
GFR, Estimated: >60 mL/min (ref >60)
Glucose, Bld: 115 mg/dL — ABNORMAL HIGH (ref 70–99)
Potassium: 4.4 mmol/L (ref 3.5–5.1)
Sodium: 129 mmol/L — ABNORMAL LOW (ref 135–145)
Total Bilirubin: 0.3 mg/dL (ref 0.3–1.2)
Total Protein: 7.8 g/dL (ref 6.5–8.1)

## 2022-12-03 LAB — IRON AND TIBC
Iron: 83 ug/dL (ref 45–182)
Saturation Ratios: 31 % (ref 17.9–39.5)
TIBC: 272 ug/dL (ref 250–450)
UIBC: 189 ug/dL

## 2022-12-03 LAB — FERRITIN: Ferritin: 28 ng/mL (ref 24–336)

## 2022-12-03 LAB — TSH: TSH: 2.255 u[IU]/mL (ref 0.350–4.500)

## 2022-12-03 MED ORDER — SODIUM CHLORIDE 0.9 % IV SOLN
1500.0000 mg | Freq: Once | INTRAVENOUS | Status: AC
  Administered 2022-12-03: 1500 mg via INTRAVENOUS
  Filled 2022-12-03: qty 30

## 2022-12-03 MED ORDER — SODIUM CHLORIDE 0.9 % IV SOLN
Freq: Once | INTRAVENOUS | Status: AC
  Filled 2022-12-03: qty 250

## 2022-12-03 MED ORDER — HEPARIN SOD (PORK) LOCK FLUSH 100 UNIT/ML IV SOLN
500.0000 [IU] | Freq: Once | INTRAVENOUS | Status: AC | PRN
  Administered 2022-12-03: 500 [IU]
  Filled 2022-12-03: qty 5

## 2022-12-03 MED ORDER — SODIUM CHLORIDE 0.9% FLUSH
10.0000 mL | INTRAVENOUS | Status: DC | PRN
  Filled 2022-12-03: qty 10

## 2022-12-03 MED ORDER — SODIUM CHLORIDE 0.9% FLUSH
10.0000 mL | INTRAVENOUS | Status: DC | PRN
  Administered 2022-12-03: 10 mL via INTRAVENOUS
  Filled 2022-12-03: qty 10

## 2022-12-03 NOTE — Patient Instructions (Signed)
[facility] [place] [facility]  Discharge Instructions: Thank you for choosing [facility] to provide your oncology and hematology care.  If you have a lab appointment with the Cancer Center, please go directly to the Cancer Center and check in at the registration area.  Wear comfortable clothing and clothing appropriate for easy access to any Portacath or PICC line.   We strive to give you quality time with your provider. You may need to reschedule your appointment if you arrive late (15 or more minutes).  Arriving late affects you and other patients whose appointments are after yours.  Also, if you miss three or more appointments without notifying the office, you may be dismissed from the clinic at the provider's discretion.      For prescription refill requests, have your pharmacy contact our office and allow 72 hours for refills to be completed.    Today you received the following chemotherapy and/or immunotherapy agents- Durvalumab      To help prevent nausea and vomiting after your treatment, we encourage you to take your nausea medication as directed.  BELOW ARE SYMPTOMS THAT SHOULD BE REPORTED IMMEDIATELY: *FEVER GREATER THAN 100.4 F (38 C) OR HIGHER *CHILLS OR SWEATING *NAUSEA AND VOMITING THAT IS NOT CONTROLLED WITH YOUR NAUSEA MEDICATION *UNUSUAL SHORTNESS OF BREATH *UNUSUAL BRUISING OR BLEEDING *URINARY PROBLEMS (pain or burning when urinating, or frequent urination) *BOWEL PROBLEMS (unusual diarrhea, constipation, pain near the anus) TENDERNESS IN MOUTH AND THROAT WITH OR WITHOUT PRESENCE OF ULCERS (sore throat, sores in mouth, or a toothache) UNUSUAL RASH, SWELLING OR PAIN  UNUSUAL VAGINAL DISCHARGE OR ITCHING   Items with * indicate a potential emergency and should be followed up as soon as possible or go to the Emergency Department if any problems should occur.  Please show the CHEMOTHERAPY ALERT CARD or IMMUNOTHERAPY ALERT CARD at check-in  to the Emergency Department and triage nurse.  Should you have questions after your visit or need to cancel or reschedule your appointment, please contact [facility] [place] [facility]  [phone] and follow the prompts.  Office hours are 8:00 a.m. to 4:30 p.m. Monday - Friday. Please note that voicemails left after 4:00 p.m. may not be returned until the following business day.  We are closed weekends and major holidays. You have access to a nurse at all times for urgent questions. Please call the main number to the clinic [phone] and follow the prompts.  For any non-urgent questions, you may also contact your provider using MyChart. We now offer e-Visits for anyone 18 and older to request care online for non-urgent symptoms. For details visit [email or web address].   Also download the MyChart app! Go to the app store, search "MyChart", open the app, select [place], and log in with your MyChart username and password.    

## 2022-12-03 NOTE — Progress Notes (Addendum)
[facility] CONSULT NOTE  Patient Care Team: [name], [name] as PCP - General (Physician Assistant) [name], [facility] (Inactive) as Pharmacist (Pharmacist) [name], RN as Oncology Nurse Navigator   CANCER STAGING   Cancer Staging  Squamous cell lung cancer Staging form: Lung, AJCC 7th Edition - Clinical: Stage IIIA (T4, N0, M0) - Signed by Michaelyn Barter, MD on 06/03/2022  CURRENT TREATMENT- Weekly Carboplatin and Taxol with RT completed on 07/27/2022.  Maintenance Durvalumab started 08/13/2022. Plan total 12 cycles.   ASSESSMENT & PLAN:  Derek Webb 72 y.o. 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  #RUL of lung SCCa, atleast Stage IIIA (cT4N0M0) #Encounter for antineoplastic immunotherapy -s/p bronchoscopy with EBUS by Dr. Jayme Cloud on 05/12/2022.  -Completed 5 cycles of CarboTaxol and RT on 07/27/2022. Cycle 6 was canceled due to persistent neutropenia.  Repeat CT chest done on 1/15 showed near complete resolution of right upper lobe mass, persistent.  Obstructive atelectasis seen.  No adenopathy.  Patient had excellent response to the treatment.    - CT chest with contrast from 11/30/2022 was reviewed with the patient and his sister.  There is interval development of marked interstitial disease and patchy airspace disease more confluent consolidative opacity in the peripheral right upper lobe progressive and interval.  In the lower lung there is ill-defined tree-in-bud nodularity with nodular areas measuring up to 1.4 cm in the posterior left lower lobe.  Features are suggestive of infectious and inflammatory etiology however disease progression cannot be excluded.  Liver lesion is stable and likely hemangioma.  Patient is totally asymptomatic.  So I would  like to hold off with antibiotics.  I have added the case to tumor board for radiology review.  -He is on maintenance durvalumab.  Labs reviewed and acceptable for treatment.  Will proceed with cycle 5 of Durvalumab today.  # Hyponatremia and hypochloremia -Could be related to dehydration.  Will proceed with IV normal saline 500 cc today. -Discussed about hydrating 1.5 L a day.  #Mild anemia  -Resolved -B12, folate, iron studies normal  # Isolated ALP elevation -Mildly increased.  GGT mildly elevated. on imaging liver lesion is stable.  Will monitor.  # Access - port placed on 05/28/2022   Orders Placed This Encounter  Procedures   TSH    Standing Status:   Future    Number of Occurrences:   1    Standing Expiration Date:   12/03/2023   T4    Standing Status:   Future    Number of Occurrences:   1    Standing Expiration Date:   12/03/2023   CBC with Differential    Standing Status:   Future    Standing Expiration Date:   12/31/2023   Comprehensive metabolic panel    Standing Status:   Future    Standing Expiration Date:   12/31/2023   T4    Standing Status:   Future    Standing Expiration Date:   12/31/2023   TSH    Standing Status:   Future    Standing Expiration Date:   12/31/2023   CBC with Differential    Standing Status:   Future    Standing Expiration Date:   01/28/2024   Comprehensive metabolic panel    Standing Status:   Future  Standing Expiration Date:   01/28/2024   RTC in 1 month for MD visit, labs, cycle 6 of Durvalumab  The total time spent in the appointment was 30 minutes encounter with patients including review of chart and various tests results, discussions about plan of care and coordination of care plan   All questions were answered. The patient knows to call the clinic with any problems, questions or concerns. No barriers to learning was detected.  Michaelyn Barter, MD 4/12/202412:39 PM   HISTORY OF PRESENTING ILLNESS:  Derek Webb 72 y.o. 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  Patient was seen today accompanied by [name].  Denies any cough, hemoptysis, shortness of breath and weight loss. Remote smoker.  Quit in 2013. Has family history of lung cancer in father.  INTERVAL HISTORY-  Patient seen today prior to cycle 5 of Durvalumab.  Accompanied by sister. He is tolerating treatments well. Patient denies fever, chills, nausea, vomiting, shortness of breath, cough, abdominal pain, bleeding, bowel or bladder issues. Energy level is good.  Appetite is good.  Denies any weight loss. Denies pain.   I have reviewed his chart and materials related to his cancer extensively and collaborated history with the patient. Summary of oncologic history is as follows: Oncology History Overview Note  History of left upper lobe lung SCC status post microwave thermal ablation in 2013 by Dr. Fredia Sorrow.  He was deemed not a surgical candidate and had transportation issues for radiation treatment.   Squamous cell lung cancer  01/12/2022 Initial Diagnosis   Patient has been following with Dr. Welton Flakes of pulmonary for suspicious lung nodule. CT chest in March 2021 showed post ablation scarring in LUL not changed and increased atelectasis of RML and RUL. He did not follow through.   CT chest in 01/12/2022 showed  New abrupt and slightly irregular cut off of the right upper lobe bronchus with associated right upper lobe atelectasis and postobstructive changes. Findings are concerning for endobronchial malignancy    04/08/2022 PET scan   IMPRESSION: 1. Hypermetabolic central right upper lobe lung mass nearly occluding the right upper lobe bronchus, poorly delineated on the noncontrast CT images, measuring approximately 3.8 x 2.9  cm, compatible with primary bronchogenic malignancy. 2. Postobstructive pneumonia/atelectasis throughout the basilar right upper lobe. 3. No hypermetabolic metastatic thoracic adenopathy or distant metastatic disease. 4. Stable mild post ablation change in the left upper lobe with no metabolic evidence of local tumor recurrence in this location. 5. Dilated 4.4 cm ascending thoracic aorta. Recommend annual imaging followup by CTA or MRA   05/12/2022 Procedure   Procedure was delayed due to miscommunication about holding Plavix. S/p bronchoscopy with EBUS by Dr. Jayme Cloud. Op note mentions significant bulky adenopathy in the subcarinal space and in the right hilum.    05/12/2022 Pathology Results   DIAGNOSIS:  A.  LUNG, RIGHT UPPER LOBE; ENB-ASSISTED BIOPSY:  - SQUAMOUS CELL CARCINOMA.   LUNG, RIGHT UPPER LOBE; EBUS-ASSISTED BRUSHING:  - POSITIVE FOR MALIGNANCY.  - SQUAMOUS CELL CARCINOMA IS PRESENT.   LUNG, RIGHT UPPER LOBE; EBUS-ASSISTED LAVAGE:  - POSITIVE FOR MALIGNANCY.  - SQUAMOUS CELL CARCINOMA IS PRESENT  SUBCARINAL SPACE, RIGHT; EBUS-ASSISTED FNA:  - POSITIVE FOR MALIGNANCY.  - SQUAMOUS CELL CARCINOMA IS PRESENT   05/18/2022 Cancer Staging   Staging form:  Lung, AJCC 7th Edition - Clinical: Stage IIIA (T4, N0, M0) - Signed by Michaelyn Barter, MD on 06/03/2022   05/21/2022 Imaging   MRI brain  No evidence of metastatic disease in the brain. 2. Small enhancing foci in the right parietal and right occipital calvarium, which are indeterminate but could represent small osseous metastases. Attention on follow-up   06/03/2022 - 07/27/2022 Chemotherapy   Patient is on Treatment Plan : LUNG Carboplatin + Paclitaxel + XRT q7d      08/03/2022 -  Chemotherapy   Patient is on Treatment Plan : LUNG NSCLC Durvalumab (1500) q28d        MEDICAL HISTORY:  Past Medical History:  Diagnosis Date   Alcoholism    COPD (chronic obstructive pulmonary disease)    SEVERE COPD  -OCCASIONALLY USES OXYGEN AT NIGHT-DOES NOT USE INHALERS ON REGULAR BASIS   Elevated cholesterol    GERD (gastroesophageal reflux disease)    HOH (hard of hearing)    Hypertension    Lung cancer    LEFT UPPER LUNG CARCINOMA-NOT A CANDIDATE FOR SURGICAL RESECTION BECAUSE OF HIS COPD AND POOR CANDIDATE FOR RADIATION BECAUSE OF TRANSPORTATION PROBLEMS   Seizures    CHRONIC DILATIN - PT STATES NO SEIZURES IN PAST COUPLE OF YEARS; PT STATES HIS SEIZURES CAUSED NUMBNESS OF ARMS AND HANDS AND NOT ABLE TO MOVE HIS ARMS   Stroke    2 TO 3 YRS AGO-AFFECTED HIS SPEECH--ABLE TO AMBULATE WITHOUT ASSIST AND DOES YARD WORK.  DECREASED HEARING IN BOTH EARS SINCE STROKE.   Vitamin D deficiency     SURGICAL HISTORY: Past Surgical History:  Procedure Laterality Date   CAROTID SURGERY 2009 -LEFT     COLON POLYP EXCISION     COLON SURGERY     COLONOSCOPY WITH PROPOFOL N/A 04/18/2017   Procedure: COLONOSCOPY WITH PROPOFOL;  Surgeon: Scot Jun, MD;  [facility] ENDOSCOPY;  Service: Endoscopy;  Laterality: N/A;   ESOPHAGOGASTRODUODENOSCOPY     IR GENERIC HISTORICAL  01/21/2016   IR RADIOLOGIST EVAL & MGMT 01/21/2016 Irish Lack, MD [facility] INTERV RAD   IR GENERIC HISTORICAL  10/22/2014   IR RADIOLOGIST EVAL & MGMT 10/22/2014 Irish Lack, MD [facility] INTERV RAD   IR IMAGING GUIDED PORT INSERTION  05/28/2022   IR RADIOLOGIST EVAL & MGMT  02/02/2017   IR RADIOLOGIST EVAL & MGMT  03/08/2018   percutaneous biopsy     VIDEO BRONCHOSCOPY WITH ENDOBRONCHIAL ULTRASOUND Right 05/12/2022   Procedure: VIDEO BRONCHOSCOPY WITH ENDOBRONCHIAL ULTRASOUND;  Surgeon: Salena Saner, MD;  Location: [facility] ORS;  Service: Cardiopulmonary;  Laterality: Right;    SOCIAL HISTORY: Social History   Socioeconomic History   Marital status: Single    Spouse name: Not on file   Number of children: Not on file   Years of education: Not on file   Highest education level: Not on file  Occupational History   Not on file   Tobacco Use   Smoking status: Former    Packs/day: 1.00    Years: 35.00    Additional pack years: 0.00    Total pack years: 35.00    Types: Cigarettes    Quit date: 12/22/2011    Years since quitting: 10.9   Smokeless tobacco: Never  Vaping Use   Vaping Use: Never used  Substance and Sexual Activity   Alcohol use: Yes    Comment: occosionally    Drug use: No   Sexual activity: Not on file  Other Topics Concern   Not  on file  Social History Narrative   Not on file   Social Determinants of Health   Financial Resource Strain: Low Risk  (11/28/2020)   Overall Financial Resource Strain (CARDIA)    Difficulty of Paying Living Expenses: Not hard at all  Food Insecurity: No Food Insecurity (06/15/2022)   Hunger Vital Sign    Worried About Running Out of Food in the Last Year: Never true    Ran Out of Food in the Last Year: Never true  Transportation Needs: No Transportation Needs (06/15/2022)   PRAPARE - Administrator, Civil Service (Medical): No    Lack of Transportation (Non-Medical): No  Physical Activity: Inactive (06/15/2022)   Exercise Vital Sign    Days of Exercise per Week: 0 days    Minutes of Exercise per Session: 0 min  Stress: Stress Concern Present (06/15/2022)   [employer] of Occupational Health - Occupational Stress Questionnaire    Feeling of Stress : To some extent  Social Connections: Socially Isolated (06/15/2022)   Social Connection and Isolation Panel [NHANES]    Frequency of Communication with Friends and Family: More than three times a week    Frequency of Social Gatherings with Friends and Family: More than three times a week    Attends Religious Services: Never    Database administrator or Organizations: No    Attends Banker Meetings: Never    Marital Status: Never married  Intimate Partner Violence: Unknown (06/15/2022)   Humiliation, Afraid, Rape, and Kick questionnaire    Fear of Current or Ex-Partner: No     Emotionally Abused: Not on file    Physically Abused: No    Sexually Abused: No    FAMILY HISTORY: Family History  Problem Relation Age of Onset   Colon polyps Sister     ALLERGIES:  has No Known Allergies.  MEDICATIONS:  Current Outpatient Medications  Medication Sig Dispense Refill   aspirin 81 MG tablet Take 81 mg by mouth daily.     atorvastatin (LIPITOR) 80 MG tablet Take 1 tablet (80 mg total) by mouth daily. 90 tablet 3   budesonide-formoterol (SYMBICORT) 160-4.5 MCG/ACT inhaler Inhale 1 puff into the lungs 2 (two) times daily. 1 each 5   Cholecalciferol (VITAMIN D) 2000 UNITS CAPS Take by mouth. VITAMIN D 3 2000 IU  ONE SOFTGEL DAILY  --OTC     clopidogrel (PLAVIX) 75 MG tablet Take 1 tablet (75 mg total) by mouth daily. 90 tablet 3   enalapril (VASOTEC) 20 MG tablet Take 1 tablet (20 mg total) by mouth daily. 90 tablet 3   ezetimibe (ZETIA) 10 MG tablet Take 1 tablet (10 mg total) by mouth daily. 90 tablet 3   Na Sulfate-K Sulfate-Mg Sulf 17.5-3.13-1.6 GM/177ML SOLN Take by mouth.     OXYGEN Inhale into the lungs. At night     pantoprazole (PROTONIX) 40 MG tablet Take 1 tablet (40 mg total) by mouth daily. IN AM 90 tablet 0   phenytoin (DILANTIN) 100 MG ER capsule Take 4 capsules (400 mg total) by mouth at bedtime. 360 capsule 0   No current facility-administered medications for this visit.   Facility-Administered Medications Ordered in Other Visits  Medication Dose Route Frequency Provider Last Rate Last Admin   sodium chloride flush (NS) 0.9 % injection 10 mL  10 mL Intracatheter PRN Alinda Dooms, NP        REVIEW OF SYSTEMS:   Pertinent information mentioned in HPI All other  systems were reviewed with the patient and are negative.  PHYSICAL EXAMINATION: ECOG PERFORMANCE STATUS: 2 - Symptomatic, <50% confined to bed  Vitals:   12/03/22 0944  BP: 116/74  Pulse: 77  Resp: 15  Temp: 97.8 F (36.6 C)  SpO2: 98%     Filed Weights   12/03/22 0944   Weight: 174 lb 12.8 oz (79.3 kg)       GENERAL:alert, no distress and comfortable SKIN: skin color, texture, turgor are normal, no rashes or significant lesions EYES: normal, conjunctiva are pink and non-injected, sclera clear OROPHARYNX:no exudate, no erythema and lips, buccal mucosa, and tongue normal  NECK: supple, thyroid normal size, non-tender, without nodularity LYMPH:  no palpable lymphadenopathy in the cervical, axillary or inguinal LUNGS: clear to auscultation and percussion with normal breathing effort HEART: regular rate & rhythm and no murmurs and no lower extremity edema ABDOMEN:abdomen soft, non-tender and normal bowel sounds Musculoskeletal:no cyanosis of digits and no clubbing  PSYCH: alert & oriented x 3 with fluent speech NEURO: no focal motor/sensory deficits  LABORATORY DATA:  I have reviewed the data as listed Lab Results  Component Value Date   WBC 4.9 12/03/2022   HGB 13.0 12/03/2022   HCT 37.5 (L) 12/03/2022   MCV 87.8 12/03/2022   PLT 541 (H) 12/03/2022   Recent Labs    10/07/22 1012 11/05/22 1011 12/03/22 0945  NA 133* 134* 129*  K 4.0 4.4 4.4  CL 98 100 97*  CO2 26 25 26   GLUCOSE 101* 112* 115*  BUN 10 9 10   CREATININE 0.51* 0.66 0.71  CALCIUM 9.2 9.2 9.0  GFRNONAA >60 >60 >60  PROT 7.5 8.0 7.8  ALBUMIN 3.6 3.5 3.5  AST 19 26 21   ALT 17 16 17   ALKPHOS 138* 162* 167*  BILITOT 0.5 0.4 0.3    RADIOGRAPHIC STUDIES: I have personally reviewed the radiological images as listed and agreed with the findings in the report. CT Chest W Contrast  Result Date: 12/01/2022 CLINICAL DATA:  Non-small-cell lung cancer. Restaging. * Tracking Code: BO * EXAM: CT CHEST WITH CONTRAST TECHNIQUE: Multidetector CT imaging of the chest was performed during intravenous contrast administration. RADIATION DOSE REDUCTION: This exam was performed according to the departmental dose-optimization program which includes automated exposure control, adjustment of the  mA and/or kV according to patient size and/or use of iterative reconstruction technique. CONTRAST:  51mL OMNIPAQUE IOHEXOL 300 MG/ML  SOLN COMPARISON:  09/06/2022 FINDINGS: Cardiovascular: The heart size is normal. No substantial pericardial effusion. Coronary artery calcification is evident. Moderate atherosclerotic calcification is noted in the wall of the thoracic aorta. Ascending thoracic aorta measures 4.2 cm diameter. Right Port-A-Cath tip is positioned at the SVC/RA junction. Mediastinum/Nodes: No mediastinal lymphadenopathy. There is no hilar lymphadenopathy. The esophagus has normal imaging features. There is no axillary lymphadenopathy. Lungs/Pleura: Centrilobular and paraseptal emphysema evident. Interval development of marked interstitial disease and patchy airspace disease, upper lung predominant with confluent consolidative opacity in the peripheral right upper lo lung be, progressive in the interval. The confluent consolidative disease in the right upper lung incorporates the post radiation fibrosis seen on the previous study. In the lower lungs, there is ill-defined tree-in-bud nodularity with nodular areas measuring up to 1.4 cm diameter in the posterior left lower lobe (105/3). No pleural effusion. Debris is noted in the bronchus intermedius Upper Abdomen: 13 mm hypervascular lesion in the left liver is similar to prior, likely hemangioma. No new or progressive findings in the visualized upper abdomen. Stable fullness  left adrenal gland. Musculoskeletal: No worrisome lytic or sclerotic osseous abnormality. IMPRESSION: 1. Interval development of marked interstitial disease and patchy airspace disease, upper lung predominant with confluent consolidative opacity in the peripheral right upper lung, progressive in the interval. In the lower lungs, there is ill-defined tree-in-bud nodularity with nodular areas measuring up to 1.4 cm diameter in the posterior left lower lobe. Imaging features are  suggestive of an infectious/inflammatory etiology although disease progression cannot be excluded. 2. Aortic Atherosclerosis (ICD10-I70.0) and Emphysema (ICD10-J43.9). Electronically Signed   By: Kennith Center M.D.   On: 12/01/2022 09:48

## 2022-12-04 LAB — T4: T4, Total: 5.7 ug/dL (ref 4.5–12.0)

## 2022-12-06 ENCOUNTER — Encounter: Payer: Self-pay | Admitting: Radiation Oncology

## 2022-12-06 ENCOUNTER — Ambulatory Visit
Admission: RE | Admit: 2022-12-06 | Discharge: 2022-12-06 | Disposition: A | Discharge status: Home / Self Care | Payer: 59 | Source: Ambulatory Visit | Attending: Radiation Oncology | Admitting: Radiation Oncology

## 2022-12-06 VITALS — BP 101/68 | HR 71 | Temp 98.0°F | Resp 18 | Ht 67.0 in | Wt 175.6 lb

## 2022-12-06 DIAGNOSIS — Z923 Personal history of irradiation: Secondary | ICD-10-CM | POA: Insufficient documentation

## 2022-12-06 DIAGNOSIS — C3490 Malignant neoplasm of unspecified part of unspecified bronchus or lung: Secondary | ICD-10-CM

## 2022-12-06 DIAGNOSIS — Z87891 Personal history of nicotine dependence: Secondary | ICD-10-CM | POA: Diagnosis not present

## 2022-12-06 DIAGNOSIS — J849 Interstitial pulmonary disease, unspecified: Secondary | ICD-10-CM | POA: Insufficient documentation

## 2022-12-06 DIAGNOSIS — C3411 Malignant neoplasm of upper lobe, right bronchus or lung: Secondary | ICD-10-CM | POA: Insufficient documentation

## 2022-12-06 NOTE — Progress Notes (Signed)
Radiation Oncology Follow up Note  Name: Derek Webb   Date:   12/06/2022 MRN:  [id number] DOB: [birth year]    This 72 y.o. male presents to the clinic today for 85-month follow-up status post concurrent chemoradiation therapy for stage IIIa squamous cell carcinoma of the right lung.  REFERRING PROVIDER: Carlean Jews, PA*  HPI: Patient is a 72 year old male now out for months having completed concurrent chemoradiation therapy for stage IIIa squamous cell carcinoma the right lung seen today in routine follow-up he is doing well states his pulmonary status has not changed she specifically [place] cough hemoptysis or chest tightness.Marland Kitchen  He recently had CT scan of his chest showing interval development of marked interstitial lung disease and patchy airspace disease in the peripheral right upper lung.  There is also ill-defined tree-in-bud nodularity with nodularities measuring up to 1.4 cm in the posterior left lower lobe suggestive of infectious inflammatory etiology.  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  He is currently on maintenance Durvalumab which she is tolerating well.  COMPLICATIONS OF TREATMENT: none  FOLLOW UP COMPLIANCE: keeps appointments   PHYSICAL EXAM:  BP 101/68   Pulse 71   Temp 98 F (36.7 C)   Resp 18   Ht 5\' 7"  (1.702 m)   Wt 175 lb 9.6 oz (79.7 kg)   BMI 27.50 kg/m  Well-developed well-nourished patient in NAD. HEENT reveals PERLA, EOMI, discs not visualized.  Oral cavity is clear. No oral mucosal lesions are identified. Neck is clear without evidence of cervical or supraclavicular adenopathy. Lungs are clear to A&P. Cardiac examination is essentially unremarkable with regular rate and rhythm without murmur rub or thrill. Abdomen is benign with no organomegaly or masses noted. Motor sensory and DTR levels are equal and symmetric in the upper and lower extremities. Cranial nerves II  through XII are grossly intact. Proprioception is intact. No peripheral adenopathy or edema is identified. No motor or sensory levels are noted. Crude visual fields are within normal range.  RADIOLOGY RESULTS: CT scans reviewed compatible with above-stated findings  PLAN: Present time patient is being closely monitored for his interstitial lung disease as well as continuing on maintenance Durvalumab which she seems to be tolerating well.  I have asked to see him back in 6 months for follow-up.  Be happy to reevaluate the patient anytime should that be indicated.  I would like to take this opportunity to thank you for allowing me to participate in the care of your patient.Carmina Miller, MD

## 2022-12-09 ENCOUNTER — Other Ambulatory Visit: Payer: 59

## 2022-12-13 ENCOUNTER — Encounter: Payer: Self-pay | Admitting: Internal Medicine

## 2022-12-20 ENCOUNTER — Encounter: Payer: Self-pay | Admitting: Internal Medicine

## 2022-12-20 ENCOUNTER — Ambulatory Visit (INDEPENDENT_AMBULATORY_CARE_PROVIDER_SITE_OTHER): Payer: 59 | Admitting: Internal Medicine

## 2022-12-20 VITALS — BP 119/80 | HR 72 | Temp 97.8°F | Resp 16 | Ht 67.0 in | Wt 179.0 lb

## 2022-12-20 DIAGNOSIS — C3492 Malignant neoplasm of unspecified part of left bronchus or lung: Secondary | ICD-10-CM | POA: Diagnosis not present

## 2022-12-20 DIAGNOSIS — Z9981 Dependence on supplemental oxygen: Secondary | ICD-10-CM | POA: Diagnosis not present

## 2022-12-20 DIAGNOSIS — J4489 Other specified chronic obstructive pulmonary disease: Secondary | ICD-10-CM

## 2022-12-20 MED ORDER — BUDESONIDE-FORMOTEROL FUMARATE 160-4.5 MCG/ACT IN AERO
1.0000 | INHALATION_SPRAY | Freq: Two times a day (BID) | RESPIRATORY_TRACT | 5 refills | Status: DC
Start: 2022-12-20 — End: 2023-05-18

## 2022-12-20 NOTE — Patient Instructions (Signed)
Chronic Obstructive Pulmonary Disease  Chronic obstructive pulmonary disease (COPD) is a long-term (chronic) lung problem. When you have COPD, it is hard for air to get in and out of your lungs. Usually the condition gets worse over time, and your lungs will never return to normal. There are things you can do to keep yourself as healthy as possible. What are the causes? Smoking. This is the most common cause. Certain genes passed from parent to child (inherited). What increases the risk? Being exposed to secondhand smoke from cigarettes, pipes, or cigars. Being exposed to chemicals and other irritants, such as fumes and dust in the work environment. Having chronic lung conditions or infections. What are the signs or symptoms? Shortness of breath, especially during physical activity. A long-term cough with a large amount of thick mucus. Sometimes, the cough may not have any mucus (dry cough). Wheezing. Breathing quickly. Skin that looks gray or blue, especially in the fingers, toes, or lips. Feeling tired (fatigue). Weight loss. Chest tightness. Having infections often. Episodes when breathing symptoms become much worse (exacerbations). At the later stages of this disease, you may have swelling in the ankles, feet, or legs. How is this treated? Taking medicines. Quitting smoking, if you smoke. Rehabilitation. This includes steps to make your body work better. It may involve a team of specialists. Doing exercises. Making changes to your diet. Using oxygen. Lung surgery. Lung transplant. Comfort measures (palliative care). Follow these instructions at home: Medicines Take over-the-counter and prescription medicines only as told by your doctor. Talk to your doctor before taking any cough or allergy medicines. You may need to avoid medicines that cause your lungs to be dry. Lifestyle If you smoke, stop smoking. Smoking makes the problem worse. Do not smoke or use any products that  contain nicotine or tobacco. If you need help quitting, ask your doctor. Avoid being around things that make your breathing worse. This may include smoke, chemicals, and fumes. Stay active, but remember to rest as well. Learn and use tips on how to manage stress and control your breathing. Make sure you get enough sleep. Most adults need at least 7 hours of sleep every night. Eat healthy foods. Eat smaller meals more often. Rest before meals. Controlled breathing Learn and use tips on how to control your breathing as told by your doctor. Try: Breathing in (inhaling) through your nose for 1 second. Then, pucker your lips and breath out (exhale) through your lips for 2 seconds. Putting one hand on your belly (abdomen). Breathe in slowly through your nose for 1 second. Your hand on your belly should move out. Pucker your lips and breathe out slowly through your lips. Your hand on your belly should move in as you breathe out.  Controlled coughing Learn and use controlled coughing to clear mucus from your lungs. Follow these steps: Lean your head a little forward. Breathe in deeply. Try to hold your breath for 3 seconds. Keep your mouth slightly open while coughing 2 times. Spit any mucus out into a tissue. Rest and do the steps again 1 or 2 times as needed. General instructions Make sure you get all the shots (vaccines) that your doctor recommends. Ask your doctor about a flu shot and a pneumonia shot. Use oxygen therapy and pulmonary rehabilitation if told by your doctor. If you need home oxygen therapy, ask your doctor if you should buy a tool to measure your oxygen level (oximeter). Make a COPD action plan with your doctor. This helps you   to know what to do if you feel worse than usual. Manage any other conditions you have as told by your doctor. Avoid going outside when it is very hot, cold, or humid. Avoid people who have a sickness you can catch (contagious). Keep all follow-up  visits. Contact a doctor if: You cough up more mucus than usual. There is a change in the color or thickness of the mucus. It is harder to breathe than usual. Your breathing is faster than usual. You have trouble sleeping. You need to use your medicines more often than usual. You have trouble doing your normal activities such as getting dressed or walking around the house. Get help right away if: You have shortness of breath while resting. You have shortness of breath that stops you from: Being able to talk. Doing normal activities. Your chest hurts for longer than 5 minutes. Your skin color is more blue than usual. Your pulse oximeter shows that you have low oxygen for longer than 5 minutes. You have a fever. You feel too tired to breathe normally. These symptoms may represent a serious problem that is an emergency. Do not wait to see if the symptoms will go away. Get medical help right away. Call your local emergency services (911 in the [place] Do not drive yourself to the hospital. Summary Chronic obstructive pulmonary disease (COPD) is a long-term lung problem. The way your lungs work will never return to normal. Usually the condition gets worse over time. There are things you can do to keep yourself as healthy as possible. Take over-the-counter and prescription medicines only as told by your doctor. If you smoke, stop. Smoking makes the problem worse. This information is not intended to replace advice given to you by your health care provider. Make sure you discuss any questions you have with your health care provider. Document Revised: 06/17/2020 Document Reviewed: 06/17/2020 Elsevier Patient Education  2023 Elsevier Inc.  

## 2022-12-20 NOTE — Progress Notes (Signed)
[facility] [address] [place], [place] [address]  Pulmonary Sleep Medicine   Office Visit Note  Patient Name: Derek Webb DOB: July 11, 1951 MRN [id number]  Date of Service: 12/20/2022  Complaints/HPI: He has been seen in oncology on mainteneance treatment with durvalumab. His breathing has been doing well. No cough noted or shortness of breath. He is currently on inhalers and is doing well. He is on symbicort. States he is using the oxygen as needed. Also needs refills on symbicort  Office Spirometry Results:     ROS  General: (-) fever, (-) chills, (-) night sweats, (-) weakness Skin: (-) rashes, (-) itching,. Eyes: (-) visual changes, (-) redness, (-) itching. Nose and Sinuses: (-) nasal stuffiness or itchiness, (-) postnasal drip, (-) nosebleeds, (-) sinus trouble. Mouth and Throat: (-) sore throat, (-) hoarseness. Neck: (-) swollen glands, (-) enlarged thyroid, (-) neck pain. Respiratory: - cough, (-) bloody sputum, - shortness of breath, - wheezing. Cardiovascular: - ankle swelling, (-) chest pain. Lymphatic: (-) lymph node enlargement. Neurologic: (-) numbness, (-) tingling. Psychiatric: (-) anxiety, (-) depression   Current Medication: Outpatient Encounter Medications as of 12/20/2022  Medication Sig   aspirin 81 MG tablet Take 81 mg by mouth daily.   atorvastatin (LIPITOR) 80 MG tablet Take 1 tablet (80 mg total) by mouth daily.   budesonide-formoterol (SYMBICORT) 160-4.5 MCG/ACT inhaler Inhale 1 puff into the lungs 2 (two) times daily.   Cholecalciferol (VITAMIN D) 2000 UNITS CAPS Take by mouth. VITAMIN D 3 2000 IU  ONE SOFTGEL DAILY  --OTC   clopidogrel (PLAVIX) 75 MG tablet Take 1 tablet (75 mg total) by mouth daily.   enalapril (VASOTEC) 20 MG tablet Take 1 tablet (20 mg total) by mouth daily.   ezetimibe (ZETIA) 10 MG tablet Take 1 tablet (10 mg total) by mouth daily.   Na Sulfate-K Sulfate-Mg Sulf 17.5-3.13-1.6 GM/177ML SOLN Take by mouth.    OXYGEN Inhale into the lungs. At night   pantoprazole (PROTONIX) 40 MG tablet Take 1 tablet (40 mg total) by mouth daily. IN AM   phenytoin (DILANTIN) 100 MG ER capsule Take 4 capsules (400 mg total) by mouth at bedtime.   No facility-administered encounter medications on file as of 12/20/2022.    Surgical History: Past Surgical History:  Procedure Laterality Date   CAROTID SURGERY 2009 -LEFT     COLON POLYP EXCISION     COLON SURGERY     COLONOSCOPY WITH PROPOFOL N/A 04/18/2017   Procedure: COLONOSCOPY WITH PROPOFOL;  Surgeon: Scot Jun, MD;  Location: [facility] ENDOSCOPY;  Service: Endoscopy;  Laterality: N/A;   ESOPHAGOGASTRODUODENOSCOPY     IR GENERIC HISTORICAL  01/21/2016   IR RADIOLOGIST EVAL & MGMT 01/21/2016 Irish Lack, MD [facility] INTERV RAD   IR GENERIC HISTORICAL  10/22/2014   IR RADIOLOGIST EVAL & MGMT 10/22/2014 Irish Lack, MD [facility] INTERV RAD   IR IMAGING GUIDED PORT INSERTION  05/28/2022   IR RADIOLOGIST EVAL & MGMT  02/02/2017   IR RADIOLOGIST EVAL & MGMT  03/08/2018   percutaneous biopsy     VIDEO BRONCHOSCOPY WITH ENDOBRONCHIAL ULTRASOUND Right 05/12/2022   Procedure: VIDEO BRONCHOSCOPY WITH ENDOBRONCHIAL ULTRASOUND;  Surgeon: Salena Saner, MD;  Location: [facility] ORS;  Service: Cardiopulmonary;  Laterality: Right;    Medical History: Past Medical History:  Diagnosis Date   Alcoholism (HCC)    COPD (chronic obstructive pulmonary disease) (HCC)    SEVERE COPD -OCCASIONALLY USES OXYGEN AT NIGHT-DOES NOT USE INHALERS ON REGULAR BASIS  Elevated cholesterol    GERD (gastroesophageal reflux disease)    HOH (hard of hearing)    Hypertension    Lung cancer (HCC)    LEFT UPPER LUNG CARCINOMA-NOT A CANDIDATE FOR SURGICAL RESECTION BECAUSE OF HIS COPD AND POOR CANDIDATE FOR RADIATION BECAUSE OF TRANSPORTATION PROBLEMS   Seizures (HCC)    CHRONIC DILATIN - PT STATES NO SEIZURES IN PAST COUPLE OF YEARS; PT STATES HIS SEIZURES CAUSED NUMBNESS OF ARMS AND HANDS AND NOT  ABLE TO MOVE HIS ARMS   Stroke (HCC)    2 TO 3 YRS AGO-AFFECTED HIS SPEECH--ABLE TO AMBULATE WITHOUT ASSIST AND DOES YARD WORK.  DECREASED HEARING IN BOTH EARS SINCE STROKE.   Vitamin D deficiency     Family History: Family History  Problem Relation Age of Onset   Colon polyps Sister     Social History: Social History   Socioeconomic History   Marital status: Single    Spouse name: Not on file   Number of children: Not on file   Years of education: Not on file   Highest education level: Not on file  Occupational History   Not on file  Tobacco Use   Smoking status: Former    Packs/day: 1.00    Years: 35.00    Additional pack years: 0.00    Total pack years: 35.00    Types: Cigarettes    Quit date: 12/22/2011    Years since quitting: 11.0   Smokeless tobacco: Never  Vaping Use   Vaping Use: Never used  Substance and Sexual Activity   Alcohol use: Yes    Comment: occosionally    Drug use: No   Sexual activity: Not on file  Other Topics Concern   Not on file  Social History Narrative   Not on file   Social Determinants of Health   Financial Resource Strain: Low Risk  (11/28/2020)   Overall Financial Resource Strain (CARDIA)    Difficulty of Paying Living Expenses: Not hard at all  Food Insecurity: No Food Insecurity (06/15/2022)   Hunger Vital Sign    Worried About Running Out of Food in the Last Year: Never true    Ran Out of Food in the Last Year: Never true  Transportation Needs: No Transportation Needs (06/15/2022)   PRAPARE - Administrator, Civil Service (Medical): No    Lack of Transportation (Non-Medical): No  Physical Activity: Inactive (06/15/2022)   Exercise Vital Sign    Days of Exercise per Week: 0 days    Minutes of Exercise per Session: 0 min  Stress: Stress Concern Present (06/15/2022)   [employer] of Occupational Health - Occupational Stress Questionnaire    Feeling of Stress : To some extent  Social Connections: Socially  Isolated (06/15/2022)   Social Connection and Isolation Panel [NHANES]    Frequency of Communication with Friends and Family: More than three times a week    Frequency of Social Gatherings with Friends and Family: More than three times a week    Attends Religious Services: Never    Database administrator or Organizations: No    Attends Banker Meetings: Never    Marital Status: Never married  Intimate Partner Violence: Unknown (06/15/2022)   Humiliation, Afraid, Rape, and Kick questionnaire    Fear of Current or Ex-Partner: No    Emotionally Abused: Not on file    Physically Abused: No    Sexually Abused: No    Vital Signs: Blood pressure 119/80,  pulse 72, temperature 97.8 F (36.6 C), resp. rate 16, height 5\' 7"  (1.702 m), weight 179 lb (81.2 kg), SpO2 95 %.  Examination: General Appearance: The patient is well-developed, well-nourished, and in no distress. Skin: Gross inspection of skin unremarkable. Head: normocephalic, no gross deformities. Eyes: no gross deformities noted. ENT: ears appear grossly normal no exudates. Neck: Supple. No thyromegaly. No LAD. Respiratory: no rhonchi noted. Cardiovascular: Normal S1 and S2 without murmur or rub. Extremities: No cyanosis. pulses are equal. Neurologic: Alert and oriented. No involuntary movements.  LABS: Recent Results (from the past 2160 hour(s))  TSH     Status: None   Collection Time: 10/07/22 10:12 AM  Result Value Ref Range   TSH 1.391 0.350 - 4.500 uIU/mL    Comment: Performed by a 3rd Generation assay with a functional sensitivity of <=0.01 uIU/mL. Performed at [facility], [address] [place], [place] [address]   T4     Status: None   Collection Time: 10/07/22 10:12 AM  Result Value Ref Range   T4, Total 5.6 4.5 - 12.0 ug/dL    Comment: (NOTE) Performed At: [facility] [address] [place], [place] [id number] Jolene Schimke MD [id number]   Comprehensive metabolic panel      Status: Abnormal   Collection Time: 10/07/22 10:12 AM  Result Value Ref Range   Sodium 133 (L) 135 - 145 mmol/L   Potassium 4.0 3.5 - 5.1 mmol/L   Chloride 98 98 - 111 mmol/L   CO2 26 22 - 32 mmol/L   Glucose, Bld 101 (H) 70 - 99 mg/dL    Comment: Glucose reference range applies only to samples taken after fasting for at least 8 hours.   BUN 10 8 - 23 mg/dL   Creatinine, Ser 3.08 (L) 0.61 - 1.24 mg/dL   Calcium 9.2 8.9 - 65.7 mg/dL   Total Protein 7.5 6.5 - 8.1 g/dL   Albumin 3.6 3.5 - 5.0 g/dL   AST 19 15 - 41 U/L   ALT 17 0 - 44 U/L   Alkaline Phosphatase 138 (H) 38 - 126 U/L   Total Bilirubin 0.5 0.3 - 1.2 mg/dL   GFR, Estimated >84 >69 mL/min    Comment: (NOTE) Calculated using the CKD-EPI Creatinine Equation (2021)    Anion gap 9 5 - 15    Comment: Performed at [facility], [address] [place], [place] [address]  CBC with Differential     Status: Abnormal   Collection Time: 10/07/22 10:12 AM  Result Value Ref Range   WBC 4.4 4.0 - 10.5 K/uL   RBC 3.84 (L) 4.22 - 5.81 MIL/uL   Hemoglobin 12.0 (L) 13.0 - 17.0 g/dL   HCT 84.1 (L) 32.4 - 40.1 %   MCV 91.9 80.0 - 100.0 fL   MCH 31.3 26.0 - 34.0 pg   MCHC 34.0 30.0 - 36.0 g/dL   RDW 02.7 25.3 - 66.4 %   Platelets 453 (H) 150 - 400 K/uL   nRBC 0.0 0.0 - 0.2 %   Neutrophils Relative % 59 %   Neutro Abs 2.6 1.7 - 7.7 K/uL   Lymphocytes Relative 20 %   Lymphs Abs 0.9 0.7 - 4.0 K/uL   Monocytes Relative 17 %   Monocytes Absolute 0.7 0.1 - 1.0 K/uL   Eosinophils Relative 2 %   Eosinophils Absolute 0.1 0.0 - 0.5 K/uL   Basophils Relative 1 %   Basophils Absolute 0.1 0.0 - 0.1 K/uL   Immature  Granulocytes 1 %   Abs Immature Granulocytes 0.02 0.00 - 0.07 K/uL    Comment: Performed at [facility], [address] [place], [place] [address]  Gamma GT     Status: Abnormal   Collection Time: 10/07/22 10:12 AM  Result Value Ref Range   GGT 71 (H) 7 - 50 U/L    Comment: Performed at [facility],  [address], [place], [place] [address]  Comprehensive metabolic panel     Status: Abnormal   Collection Time: 11/05/22 10:11 AM  Result Value Ref Range   Sodium 134 (L) 135 - 145 mmol/L   Potassium 4.4 3.5 - 5.1 mmol/L   Chloride 100 98 - 111 mmol/L   CO2 25 22 - 32 mmol/L   Glucose, Bld 112 (H) 70 - 99 mg/dL    Comment: Glucose reference range applies only to samples taken after fasting for at least 8 hours.   BUN 9 8 - 23 mg/dL   Creatinine, Ser 0.98 0.61 - 1.24 mg/dL   Calcium 9.2 8.9 - 11.9 mg/dL   Total Protein 8.0 6.5 - 8.1 g/dL   Albumin 3.5 3.5 - 5.0 g/dL   AST 26 15 - 41 U/L   ALT 16 0 - 44 U/L   Alkaline Phosphatase 162 (H) 38 - 126 U/L   Total Bilirubin 0.4 0.3 - 1.2 mg/dL   GFR, Estimated >14 >78 mL/min    Comment: (NOTE) Calculated using the CKD-EPI Creatinine Equation (2021)    Anion gap 9 5 - 15    Comment: Performed at [facility], [address] [place], [place] [address]  CBC with Differential     Status: Abnormal   Collection Time: 11/05/22 10:11 AM  Result Value Ref Range   WBC 5.7 4.0 - 10.5 K/uL   RBC 3.80 (L) 4.22 - 5.81 MIL/uL   Hemoglobin 12.0 (L) 13.0 - 17.0 g/dL   HCT 13.0 (L) 86.5 - 78.4 %   MCV 91.8 80.0 - 100.0 fL   MCH 31.6 26.0 - 34.0 pg   MCHC 34.4 30.0 - 36.0 g/dL   RDW 69.6 29.5 - 28.4 %   Platelets 497 (H) 150 - 400 K/uL   nRBC 0.0 0.0 - 0.2 %   Neutrophils Relative % 77 %   Neutro Abs 4.4 1.7 - 7.7 K/uL   Lymphocytes Relative 10 %   Lymphs Abs 0.6 (L) 0.7 - 4.0 K/uL   Monocytes Relative 11 %   Monocytes Absolute 0.6 0.1 - 1.0 K/uL   Eosinophils Relative 0 %   Eosinophils Absolute 0.0 0.0 - 0.5 K/uL   Basophils Relative 1 %   Basophils Absolute 0.0 0.0 - 0.1 K/uL   Immature Granulocytes 1 %   Abs Immature Granulocytes 0.03 0.00 - 0.07 K/uL    Comment: Performed at [facility], [address] [place], [place] [address]  Comprehensive metabolic panel     Status: Abnormal   Collection Time: 12/03/22  9:45 AM   Result Value Ref Range   Sodium 129 (L) 135 - 145 mmol/L   Potassium 4.4 3.5 - 5.1 mmol/L   Chloride 97 (L) 98 - 111 mmol/L   CO2 26 22 - 32 mmol/L   Glucose, Bld 115 (H) 70 - 99 mg/dL    Comment: Glucose reference range applies only to samples taken after fasting for at least 8 hours.   BUN 10 8 - 23 mg/dL   Creatinine, Ser 0.10 0.61 - 1.24  mg/dL   Calcium 9.0 8.9 - 16.1 mg/dL   Total Protein 7.8 6.5 - 8.1 g/dL   Albumin 3.5 3.5 - 5.0 g/dL   AST 21 15 - 41 U/L   ALT 17 0 - 44 U/L   Alkaline Phosphatase 167 (H) 38 - 126 U/L   Total Bilirubin 0.3 0.3 - 1.2 mg/dL   GFR, Estimated >09 >60 mL/min    Comment: (NOTE) Calculated using the CKD-EPI Creatinine Equation (2021)    Anion gap 6 5 - 15    Comment: Performed at [facility], [address] [place], [place] [address]  CBC with Differential     Status: Abnormal   Collection Time: 12/03/22  9:45 AM  Result Value Ref Range   WBC 4.9 4.0 - 10.5 K/uL   RBC 4.27 4.22 - 5.81 MIL/uL   Hemoglobin 13.0 13.0 - 17.0 g/dL   HCT 81.1 (L) 91.4 - 78.2 %   MCV 87.8 80.0 - 100.0 fL   MCH 30.4 26.0 - 34.0 pg   MCHC 34.7 30.0 - 36.0 g/dL   RDW 95.6 21.3 - 08.6 %   Platelets 541 (H) 150 - 400 K/uL   nRBC 0.0 0.0 - 0.2 %   Neutrophils Relative % 60 %   Neutro Abs 2.9 1.7 - 7.7 K/uL   Lymphocytes Relative 18 %   Lymphs Abs 0.9 0.7 - 4.0 K/uL   Monocytes Relative 19 %   Monocytes Absolute 0.9 0.1 - 1.0 K/uL   Eosinophils Relative 2 %   Eosinophils Absolute 0.1 0.0 - 0.5 K/uL   Basophils Relative 1 %   Basophils Absolute 0.1 0.0 - 0.1 K/uL   Immature Granulocytes 0 %   Abs Immature Granulocytes 0.02 0.00 - 0.07 K/uL    Comment: Performed at [facility], [address] [place], [place] [address]  Ferritin     Status: None   Collection Time: 12/03/22  9:45 AM  Result Value Ref Range   Ferritin 28 24 - 336 ng/mL    Comment: Performed at [facility], [address] [place], [place] [address]  Iron and TIBC      Status: None   Collection Time: 12/03/22  9:45 AM  Result Value Ref Range   Iron 83 45 - 182 ug/dL   TIBC 284 132 - 440 ug/dL   Saturation Ratios 31 17.9 - 39.5 %   UIBC 189 ug/dL    Comment: Performed at [facility], [address] [place], [place] [address]  T4     Status: None   Collection Time: 12/03/22 10:41 AM  Result Value Ref Range   T4, Total 5.7 4.5 - 12.0 ug/dL    Comment: (NOTE) Performed At: [facility] [address] [place], [place] [id number] Jolene Schimke MD [id number]   TSH     Status: None   Collection Time: 12/03/22 10:41 AM  Result Value Ref Range   TSH 2.255 0.350 - 4.500 uIU/mL    Comment: Performed by a 3rd Generation assay with a functional sensitivity of <=0.01 uIU/mL. Performed at [facility], [address], [place], [place] [address]     Radiology: No results found.  No results found.  CT Chest W Contrast  Result Date: 12/01/2022 CLINICAL DATA:  Non-small-cell lung cancer. Restaging. * Tracking Code: BO * EXAM: CT CHEST WITH CONTRAST TECHNIQUE: Multidetector CT imaging of the chest was performed during intravenous contrast administration. RADIATION DOSE REDUCTION: This exam was performed according to  the departmental dose-optimization program which includes automated exposure control, adjustment of the mA and/or kV according to patient size and/or use of iterative reconstruction technique. CONTRAST:  75mL OMNIPAQUE IOHEXOL 300 MG/ML  SOLN COMPARISON:  09/06/2022 FINDINGS: Cardiovascular: The heart size is normal. No substantial pericardial effusion. Coronary artery calcification is evident. Moderate atherosclerotic calcification is noted in the wall of the thoracic aorta. Ascending thoracic aorta measures 4.2 cm diameter. Right Port-A-Cath tip is positioned at the SVC/RA junction. Mediastinum/Nodes: No mediastinal lymphadenopathy. There is no hilar lymphadenopathy. The esophagus has normal imaging features. There is no  axillary lymphadenopathy. Lungs/Pleura: Centrilobular and paraseptal emphysema evident. Interval development of marked interstitial disease and patchy airspace disease, upper lung predominant with confluent consolidative opacity in the peripheral right upper lo lung be, progressive in the interval. The confluent consolidative disease in the right upper lung incorporates the post radiation fibrosis seen on the previous study. In the lower lungs, there is ill-defined tree-in-bud nodularity with nodular areas measuring up to 1.4 cm diameter in the posterior left lower lobe (105/3). No pleural effusion. Debris is noted in the bronchus intermedius Upper Abdomen: 13 mm hypervascular lesion in the left liver is similar to prior, likely hemangioma. No new or progressive findings in the visualized upper abdomen. Stable fullness left adrenal gland. Musculoskeletal: No worrisome lytic or sclerotic osseous abnormality. IMPRESSION: 1. Interval development of marked interstitial disease and patchy airspace disease, upper lung predominant with confluent consolidative opacity in the peripheral right upper lung, progressive in the interval. In the lower lungs, there is ill-defined tree-in-bud nodularity with nodular areas measuring up to 1.4 cm diameter in the posterior left lower lobe. Imaging features are suggestive of an infectious/inflammatory etiology although disease progression cannot be excluded. 2. Aortic Atherosclerosis (ICD10-I70.0) and Emphysema (ICD10-J43.9). Electronically Signed   By: Kennith Center M.D.   On: 12/01/2022 09:48    Assessment and Plan: Patient Active Problem List   Diagnosis Date Noted   Hyponatremia 12/03/2022   Anemia due to antineoplastic chemotherapy 07/05/2022   Chemotherapy induced neutropenia (HCC) 07/05/2022   History of lung cancer 04/21/2020   Seizures (HCC) 07/20/2019   Encounter for antineoplastic immunotherapy 07/20/2019   Need for vaccination against Streptococcus pneumoniae  using pneumococcal conjugate vaccine 13 03/05/2019   GERD (gastroesophageal reflux disease) 02/08/2018   Obstructive chronic bronchitis without exacerbation 02/08/2018   Encounter for general adult medical examination with abnormal findings 12/18/2017   Atherosclerosis of aorta (HCC) 12/18/2017   Dysuria 12/18/2017   Cerebrovascular accident (CVA) (HCC) 11/01/2017   HOH (hard of hearing) 11/01/2017   Essential hypertension 11/01/2017   Hyperlipidemia 11/01/2017   Hx of adenomatous polyp of colon 12/16/2016   Squamous cell lung cancer (HCC)    Lung cancer, 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HCC) 05/07/2012    1. Obstructive chronic bronchitis without exacerbation Provided refills for the Symbicort patient's symptoms do appear to be stable we will continue to monitor - budesonide-formoterol (SYMBICORT) 160-4.5 MCG/ACT inhaler; Inhale 1 puff into the lungs 2 (two) times daily.  Dispense: 1 each; Refill: 5  2. 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 Oxygen dependent Oxygen therapy as has been previously discussed  General Counseling: I have discussed the findings of the evaluation and examination with Fayrene Fearing.  I have also discussed any further diagnostic evaluation thatmay be needed or ordered today. Jamair verbalizes understanding of the findings of todays visit. We also reviewed his medications today and discussed drug interactions and side effects including but not limited  excessive drowsiness and altered mental states. We also discussed that there is always a risk not just to him but also people around him. he has been encouraged to call the office with any questions or concerns that should arise related to todays visit.  No orders of the defined types were placed in this encounter.    Time spent: 10  I have personally obtained a history, examined the patient, evaluated laboratory and imaging results, formulated the assessment and plan and placed orders.     Yevonne Pax, MD [facility] Pulmonary and Critical Care Sleep medicine

## 2022-12-21 ENCOUNTER — Other Ambulatory Visit: Payer: Self-pay

## 2022-12-21 ENCOUNTER — Other Ambulatory Visit: Payer: Self-pay | Admitting: Physician Assistant

## 2022-12-21 DIAGNOSIS — K219 Gastro-esophageal reflux disease without esophagitis: Secondary | ICD-10-CM

## 2022-12-22 DIAGNOSIS — J449 Chronic obstructive pulmonary disease, unspecified: Secondary | ICD-10-CM | POA: Diagnosis not present

## 2022-12-28 ENCOUNTER — Other Ambulatory Visit: Payer: Self-pay | Admitting: Physician Assistant

## 2022-12-28 DIAGNOSIS — Z8669 Personal history of other diseases of the nervous system and sense organs: Secondary | ICD-10-CM

## 2022-12-29 NOTE — Telephone Encounter (Signed)
Can you please review that you are refill this med

## 2022-12-31 ENCOUNTER — Inpatient Hospital Stay: Payer: 59 | Attending: Internal Medicine

## 2022-12-31 ENCOUNTER — Inpatient Hospital Stay (HOSPITAL_BASED_OUTPATIENT_CLINIC_OR_DEPARTMENT_OTHER): Payer: 59 | Admitting: Internal Medicine

## 2022-12-31 ENCOUNTER — Encounter: Payer: Self-pay | Admitting: *Deleted

## 2022-12-31 ENCOUNTER — Encounter: Payer: Self-pay | Admitting: Internal Medicine

## 2022-12-31 ENCOUNTER — Inpatient Hospital Stay: Payer: 59

## 2022-12-31 VITALS — BP 125/80 | HR 100 | Temp 96.5°F | Wt 179.0 lb

## 2022-12-31 DIAGNOSIS — I10 Essential (primary) hypertension: Secondary | ICD-10-CM | POA: Insufficient documentation

## 2022-12-31 DIAGNOSIS — R918 Other nonspecific abnormal finding of lung field: Secondary | ICD-10-CM | POA: Diagnosis not present

## 2022-12-31 DIAGNOSIS — C3491 Malignant neoplasm of unspecified part of right bronchus or lung: Secondary | ICD-10-CM | POA: Diagnosis not present

## 2022-12-31 DIAGNOSIS — D649 Anemia, unspecified: Secondary | ICD-10-CM | POA: Insufficient documentation

## 2022-12-31 DIAGNOSIS — J440 Chronic obstructive pulmonary disease with acute lower respiratory infection: Secondary | ICD-10-CM | POA: Diagnosis not present

## 2022-12-31 DIAGNOSIS — Z452 Encounter for adjustment and management of vascular access device: Secondary | ICD-10-CM | POA: Diagnosis not present

## 2022-12-31 DIAGNOSIS — Z5112 Encounter for antineoplastic immunotherapy: Secondary | ICD-10-CM

## 2022-12-31 DIAGNOSIS — Z8673 Personal history of transient ischemic attack (TIA), and cerebral infarction without residual deficits: Secondary | ICD-10-CM | POA: Diagnosis not present

## 2022-12-31 DIAGNOSIS — Z87891 Personal history of nicotine dependence: Secondary | ICD-10-CM | POA: Insufficient documentation

## 2022-12-31 DIAGNOSIS — C3411 Malignant neoplasm of upper lobe, right bronchus or lung: Secondary | ICD-10-CM | POA: Diagnosis not present

## 2022-12-31 LAB — CBC WITH DIFFERENTIAL/PLATELET
Abs Immature Granulocytes: 0.02 10*3/uL (ref 0.00–0.07)
Basophils Absolute: 0.1 10*3/uL (ref 0.0–0.1)
Basophils Relative: 2 %
Eosinophils Absolute: 0.1 10*3/uL (ref 0.0–0.5)
Eosinophils Relative: 3 %
HCT: 37.1 % — ABNORMAL LOW (ref 39.0–52.0)
Hemoglobin: 12.3 g/dL — ABNORMAL LOW (ref 13.0–17.0)
Immature Granulocytes: 1 %
Lymphocytes Relative: 27 %
Lymphs Abs: 0.9 10*3/uL (ref 0.7–4.0)
MCH: 29.9 pg (ref 26.0–34.0)
MCHC: 33.2 g/dL (ref 30.0–36.0)
MCV: 90.3 fL (ref 80.0–100.0)
Monocytes Absolute: 0.5 10*3/uL (ref 0.1–1.0)
Monocytes Relative: 15 %
Neutro Abs: 1.8 10*3/uL (ref 1.7–7.7)
Neutrophils Relative %: 52 %
Platelets: 416 10*3/uL — ABNORMAL HIGH (ref 150–400)
RBC: 4.11 MIL/uL — ABNORMAL LOW (ref 4.22–5.81)
RDW: 13.2 % (ref 11.5–15.5)
WBC: 3.3 10*3/uL — ABNORMAL LOW (ref 4.0–10.5)
nRBC: 0 % (ref 0.0–0.2)

## 2022-12-31 LAB — COMPREHENSIVE METABOLIC PANEL
ALT: 19 U/L (ref 0–44)
AST: 19 U/L (ref 15–41)
Albumin: 3.6 g/dL (ref 3.5–5.0)
Alkaline Phosphatase: 108 U/L (ref 38–126)
Anion gap: 10 (ref 5–15)
BUN: 12 mg/dL (ref 8–23)
CO2: 25 mmol/L (ref 22–32)
Calcium: 9.1 mg/dL (ref 8.9–10.3)
Chloride: 101 mmol/L (ref 98–111)
Creatinine, Ser: 0.45 mg/dL — ABNORMAL LOW (ref 0.61–1.24)
GFR, Estimated: >60 mL/min (ref >60)
Glucose, Bld: 88 mg/dL (ref 70–99)
Potassium: 3.9 mmol/L (ref 3.5–5.1)
Sodium: 136 mmol/L (ref 135–145)
Total Bilirubin: 0.6 mg/dL (ref 0.3–1.2)
Total Protein: 7.7 g/dL (ref 6.5–8.1)

## 2022-12-31 LAB — TSH: TSH: 1.224 u[IU]/mL (ref 0.350–4.500)

## 2022-12-31 MED ORDER — HEPARIN SOD (PORK) LOCK FLUSH 100 UNIT/ML IV SOLN
500.0000 [IU] | Freq: Once | INTRAVENOUS | Status: AC
  Administered 2022-12-31: 500 [IU] via INTRAVENOUS
  Filled 2022-12-31: qty 5

## 2022-12-31 NOTE — Progress Notes (Signed)
[facility] CONSULT NOTE  Patient Care Team: [name], [name] as PCP - General (Physician Assistant) [name], [facility] (Inactive) as Pharmacist (Pharmacist) [name], RN as Oncology Nurse Navigator   CANCER STAGING   Cancer Staging  Squamous cell lung cancer [name]) Staging form: Lung, AJCC 7th Edition - Clinical: Stage IIIA (T4, N0, M0) - Signed by Michaelyn Barter, MD on 06/03/2022  CURRENT TREATMENT- Weekly Carboplatin and Taxol with RT completed on 07/27/2022.  Maintenance Durvalumab started 08/13/2022. Plan total 12 cycles.   ASSESSMENT & PLAN:  Derek Webb 72 y.o. 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  #RUL of lung SCCa, atleast Stage IIIA (cT4N0M0) #Encounter for antineoplastic immunotherapy -s/p bronchoscopy with EBUS by Dr. Jayme Cloud on 05/12/2022.  -Completed 5 cycles of CarboTaxol and RT on 07/27/2022. Cycle 6 was canceled due to persistent neutropenia.  Repeat CT chest done on 1/15 showed near complete resolution of right upper lobe mass, persistent.  Obstructive atelectasis seen.  No adenopathy.  Patient had excellent response to the treatment.    - CT chest with contrast (11/30/2022) was reviewed at the tumor conference.  There is interval development of marked interstitial disease and patchy airspace disease more confluent consolidative opacity in the peripheral right upper lobe progressive and interval.  In the lower lung there is ill-defined tree-in-bud nodularity with nodular areas measuring up to 1.4 cm in the posterior left lower lobe.  Features are suggestive of infectious and inflammatory etiology however disease progression cannot be excluded.  Liver lesion is stable and likely hemangioma.  Patient continues to be asymptomatic.  His last  treatment with Durvalumab cycle 5 was on 12/03/2022.  I discussed with Dr. Welton Flakes and we decided to hold off on any empiric antibiotic treatment.  I am concerned if the changes are secondary to immunotherapy related pneumonitis.  NCCN guidelines were reviewed.  Considering the patient is asymptomatic, I will hold off on treatment with prednisone.  Hold Durvalumab today.  Repeat CT chest with contrast end of May 2024.  #Mild anemia  -Likely secondary to prior chemoradiation  # Access - port placed on 05/28/2022   Orders Placed This Encounter  Procedures   CBC with Differential    Standing Status:   Future    Standing Expiration Date:   01/25/2024   Comprehensive metabolic panel    Standing Status:   Future    Standing Expiration Date:   01/25/2024   T4    Standing Status:   Future    Standing Expiration Date:   01/25/2024   TSH    Standing Status:   Future    Standing Expiration Date:   01/25/2024   RTC in 1 month for MD visit, labs, cycle 6 of Durvalumab  The total time spent in the appointment was 30 minutes encounter with patients including review of chart and various tests results, discussions about plan of care and coordination of care plan   All questions were answered. The patient knows to call the clinic with any problems, questions or concerns. No barriers to learning was detected.  Michaelyn Barter, MD 5/10/20249:56 AM   HISTORY OF PRESENTING ILLNESS:  Derek Webb 72 y.o. 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  Patient was seen today accompanied by [name].  Denies any cough, hemoptysis, shortness of breath and weight loss. Remote smoker.  Quit in 2013. Has family history of lung cancer in father.  INTERVAL HISTORY-  Patient seen today prior to cycle 6 of  Durvalumab.  Accompanied by sister. He is tolerating treatments well. Patient denies fever, chills, nausea, vomiting, shortness of breath, cough, abdominal pain, bleeding, bowel or bladder issues. Energy level is good.  Appetite is good.  Denies any weight loss. Denies pain.   I have reviewed his chart and materials related to his cancer extensively and collaborated history with the patient. Summary of oncologic history is as follows: Oncology History Overview Note  History of left upper lobe lung SCC status post microwave thermal ablation in 2013 by Dr. Fredia Sorrow.  He was deemed not a surgical candidate and had transportation issues for radiation treatment.   Squamous cell lung cancer (HCC)  01/12/2022 Initial Diagnosis   Patient has been following with Dr. Welton Flakes of pulmonary for suspicious lung nodule. CT chest in March 2021 showed post ablation scarring in LUL not changed and increased atelectasis of RML and RUL. He did not follow through.   CT chest in 01/12/2022 showed  New abrupt and slightly irregular cut off of the right upper lobe bronchus with associated right upper lobe atelectasis and postobstructive changes. Findings are concerning for endobronchial malignancy    04/08/2022 PET scan   IMPRESSION: 1. Hypermetabolic central right upper lobe lung mass nearly occluding the right upper lobe bronchus, poorly delineated on the noncontrast CT images, measuring approximately 3.8 x 2.9 cm, compatible with primary bronchogenic malignancy. 2. Postobstructive pneumonia/atelectasis throughout the basilar right upper lobe. 3. No hypermetabolic metastatic thoracic adenopathy or distant metastatic disease. 4. Stable mild post ablation change in the left upper lobe with no metabolic evidence of local tumor recurrence in this location. 5. Dilated 4.4 cm ascending thoracic aorta. Recommend annual imaging followup by CTA or MRA   05/12/2022 Procedure   Procedure was delayed due to  miscommunication about holding Plavix. S/p bronchoscopy with EBUS by Dr. Jayme Cloud. Op note mentions significant bulky adenopathy in the subcarinal space and in the right hilum.    05/12/2022 Pathology Results   DIAGNOSIS:  A.  LUNG, RIGHT UPPER LOBE; ENB-ASSISTED BIOPSY:  - SQUAMOUS CELL CARCINOMA.   LUNG, RIGHT UPPER LOBE; EBUS-ASSISTED BRUSHING:  - POSITIVE FOR MALIGNANCY.  - SQUAMOUS CELL CARCINOMA IS PRESENT.   LUNG, RIGHT UPPER LOBE; EBUS-ASSISTED LAVAGE:  - POSITIVE FOR MALIGNANCY.  - SQUAMOUS CELL CARCINOMA IS PRESENT  SUBCARINAL SPACE, RIGHT; EBUS-ASSISTED FNA:  - POSITIVE FOR MALIGNANCY.  - SQUAMOUS CELL CARCINOMA IS PRESENT   05/18/2022 Cancer Staging   Staging form: Lung, AJCC 7th Edition - Clinical: Stage IIIA (T4, N0, M0) - Signed by Michaelyn Barter, MD on 06/03/2022   05/21/2022 Imaging   MRI brain  No evidence of metastatic disease in the brain. 2. Small enhancing foci in the right parietal and right occipital calvarium, which are indeterminate but could represent small osseous metastases. Attention on follow-up   06/03/2022 - 07/27/2022 Chemotherapy   Patient is on Treatment Plan : LUNG Carboplatin + Paclitaxel + XRT q7d      08/03/2022 -  Chemotherapy   Patient is on Treatment Plan : LUNG NSCLC Durvalumab (1500) q28d        MEDICAL HISTORY:  Past Medical History:  Diagnosis Date   Alcoholism (HCC)    COPD (chronic obstructive pulmonary disease) (HCC)    SEVERE COPD -OCCASIONALLY USES OXYGEN AT NIGHT-DOES NOT USE INHALERS ON REGULAR BASIS   Elevated cholesterol    GERD (gastroesophageal reflux disease)    HOH (hard of hearing)    Hypertension    Lung cancer (HCC)    LEFT UPPER LUNG CARCINOMA-NOT A CANDIDATE FOR SURGICAL RESECTION BECAUSE OF HIS COPD AND POOR CANDIDATE FOR RADIATION BECAUSE OF TRANSPORTATION PROBLEMS   Seizures (HCC)    CHRONIC DILATIN - PT STATES NO SEIZURES IN PAST COUPLE OF YEARS; PT STATES HIS SEIZURES CAUSED NUMBNESS OF ARMS AND  HANDS AND NOT ABLE TO MOVE HIS ARMS   Stroke (HCC)    2 TO 3 YRS AGO-AFFECTED HIS SPEECH--ABLE TO AMBULATE WITHOUT ASSIST AND DOES YARD WORK.  DECREASED HEARING IN BOTH EARS SINCE STROKE.   Vitamin D deficiency     SURGICAL HISTORY: Past Surgical History:  Procedure Laterality Date   CAROTID SURGERY 2009 -LEFT     COLON POLYP EXCISION     COLON SURGERY     COLONOSCOPY WITH PROPOFOL N/A 04/18/2017   Procedure: COLONOSCOPY WITH PROPOFOL;  Surgeon: Scot Jun, MD;  Location: [facility] ENDOSCOPY;  Service: Endoscopy;  Laterality: N/A;   ESOPHAGOGASTRODUODENOSCOPY     IR GENERIC HISTORICAL  01/21/2016   IR RADIOLOGIST EVAL & MGMT 01/21/2016 Irish Lack, MD [facility] INTERV RAD   IR GENERIC HISTORICAL  10/22/2014   IR RADIOLOGIST EVAL & MGMT 10/22/2014 Irish Lack, MD [facility] INTERV RAD   IR IMAGING GUIDED PORT INSERTION  05/28/2022   IR RADIOLOGIST EVAL & MGMT  02/02/2017   IR RADIOLOGIST EVAL & MGMT  03/08/2018   percutaneous biopsy     VIDEO BRONCHOSCOPY WITH ENDOBRONCHIAL ULTRASOUND Right 05/12/2022   Procedure: VIDEO BRONCHOSCOPY WITH ENDOBRONCHIAL ULTRASOUND;  Surgeon: Salena Saner, MD;  Location: [facility] ORS;  Service: Cardiopulmonary;  Laterality: Right;    SOCIAL HISTORY: Social History   Socioeconomic History   Marital status: Single    Spouse name: Not on file   Number of children: Not on file   Years of education: Not on file   Highest education level: Not on file  Occupational History   Not on file  Tobacco Use   Smoking status: Former    Packs/day: 1.00    Years: 35.00    Additional pack years: 0.00    Total pack years: 35.00    Types: Cigarettes    Quit date: 12/22/2011    Years since quitting: 11.0   Smokeless tobacco: Never  Vaping Use   Vaping Use: Never used  Substance and Sexual Activity   Alcohol use: Yes    Comment: occosionally    Drug use: No   Sexual activity: Not on file  Other Topics Concern   Not on file  Social History Narrative   Not on file    Social Determinants of Health   Financial Resource Strain: Low Risk  (11/28/2020)   Overall Financial Resource Strain (CARDIA)    Difficulty of Paying Living Expenses: Not hard at all  Food Insecurity: No Food Insecurity (06/15/2022)   Hunger Vital Sign    Worried About Running Out of Food in the Last Year: Never true    Ran Out of Food in the Last Year: Never true  Transportation Needs: No Transportation Needs (06/15/2022)   PRAPARE - Administrator, Civil Service (Medical): No    Lack of Transportation (Non-Medical):  No  Physical Activity: Inactive (06/15/2022)   Exercise Vital Sign    Days of Exercise per Week: 0 days    Minutes of Exercise per Session: 0 min  Stress: Stress Concern Present (06/15/2022)   [employer] of Occupational Health - Occupational Stress Questionnaire    Feeling of Stress : To some extent  Social Connections: Socially Isolated (06/15/2022)   Social Connection and Isolation Panel [NHANES]    Frequency of Communication with Friends and Family: More than three times a week    Frequency of Social Gatherings with Friends and Family: More than three times a week    Attends Religious Services: Never    Database administrator or Organizations: No    Attends Banker Meetings: Never    Marital Status: Never married  Intimate Partner Violence: Unknown (06/15/2022)   Humiliation, Afraid, Rape, and Kick questionnaire    Fear of Current or Ex-Partner: No    Emotionally Abused: Not on file    Physically Abused: No    Sexually Abused: No    FAMILY HISTORY: Family History  Problem Relation Age of Onset   Colon polyps Sister     ALLERGIES:  has No Known Allergies.  MEDICATIONS:  Current Outpatient Medications  Medication Sig Dispense Refill   aspirin 81 MG tablet Take 81 mg by mouth daily.     atorvastatin (LIPITOR) 80 MG tablet Take 1 tablet (80 mg total) by mouth daily. 90 tablet 3   budesonide-formoterol (SYMBICORT) 160-4.5  MCG/ACT inhaler Inhale 1 puff into the lungs 2 (two) times daily. 1 each 5   Cholecalciferol (VITAMIN D) 2000 UNITS CAPS Take by mouth. VITAMIN D 3 2000 IU  ONE SOFTGEL DAILY  --OTC     clopidogrel (PLAVIX) 75 MG tablet Take 1 tablet (75 mg total) by mouth daily. 90 tablet 3   enalapril (VASOTEC) 20 MG tablet Take 1 tablet (20 mg total) by mouth daily. 90 tablet 3   ezetimibe (ZETIA) 10 MG tablet Take 1 tablet (10 mg total) by mouth daily. 90 tablet 3   Na Sulfate-K Sulfate-Mg Sulf 17.5-3.13-1.6 GM/177ML SOLN Take by mouth.     OXYGEN Inhale into the lungs. At night     pantoprazole (PROTONIX) 40 MG tablet Take 1 tablet (40 mg total) by mouth daily. IN AM 90 tablet 0   phenytoin (DILANTIN) 100 MG ER capsule Take 4 capsules (400 mg total) by mouth at bedtime. 360 capsule 0   No current facility-administered medications for this visit.    REVIEW OF SYSTEMS:   Pertinent information mentioned in HPI All other systems were reviewed with the patient and are negative.  PHYSICAL EXAMINATION: ECOG PERFORMANCE STATUS: 2 - Symptomatic, <50% confined to bed  Vitals:   12/31/22 0929  BP: 125/80  Pulse: 100  Temp: (!) 96.5 F (35.8 C)  SpO2: 98%     Filed Weights   12/31/22 0929  Weight: 179 lb (81.2 kg)       GENERAL:alert, no distress and comfortable SKIN: skin color, texture, turgor are normal, no rashes or significant lesions EYES: normal, conjunctiva are pink and non-injected, sclera clear OROPHARYNX:no exudate, no erythema and lips, buccal mucosa, and tongue normal  NECK: supple, thyroid normal size, non-tender, without nodularity LYMPH:  no palpable lymphadenopathy in the cervical, axillary or inguinal LUNGS: clear to auscultation and percussion with normal breathing effort HEART: regular rate & rhythm and no murmurs and no lower extremity edema ABDOMEN:abdomen soft, non-tender and normal bowel sounds Musculoskeletal:no  cyanosis of digits and no clubbing  PSYCH: alert &  oriented x 3 with fluent speech NEURO: no focal motor/sensory deficits  LABORATORY DATA:  I have reviewed the data as listed Lab Results  Component Value Date   WBC 3.3 (L) 12/31/2022   HGB 12.3 (L) 12/31/2022   HCT 37.1 (L) 12/31/2022   MCV 90.3 12/31/2022   PLT 416 (H) 12/31/2022   Recent Labs    11/05/22 1011 12/03/22 0945 12/31/22 0916  NA 134* 129* 136  K 4.4 4.4 3.9  CL 100 97* 101  CO2 25 26 25   GLUCOSE 112* 115* 88  BUN 9 10 12   CREATININE 0.66 0.71 0.45*  CALCIUM 9.2 9.0 9.1  GFRNONAA >60 >60 >60  PROT 8.0 7.8 7.7  ALBUMIN 3.5 3.5 3.6  AST 26 21 19   ALT 16 17 19   ALKPHOS 162* 167* 108  BILITOT 0.4 0.3 0.6    RADIOGRAPHIC STUDIES: I have personally reviewed the radiological images as listed and agreed with the findings in the report. No results found.

## 2023-01-01 LAB — T4: T4, Total: 5.6 ug/dL (ref 4.5–12.0)

## 2023-01-11 ENCOUNTER — Ambulatory Visit: Admission: RE | Admit: 2023-01-11 | Payer: 59 | Source: Ambulatory Visit

## 2023-01-19 ENCOUNTER — Ambulatory Visit
Admission: RE | Admit: 2023-01-19 | Discharge: 2023-01-19 | Disposition: A | Discharge status: Home / Self Care | Payer: 59 | Source: Ambulatory Visit | Attending: Internal Medicine | Admitting: Internal Medicine

## 2023-01-19 DIAGNOSIS — C3490 Malignant neoplasm of unspecified part of unspecified bronchus or lung: Secondary | ICD-10-CM | POA: Diagnosis not present

## 2023-01-19 DIAGNOSIS — C3491 Malignant neoplasm of unspecified part of right bronchus or lung: Secondary | ICD-10-CM

## 2023-01-19 DIAGNOSIS — J432 Centrilobular emphysema: Secondary | ICD-10-CM | POA: Diagnosis not present

## 2023-01-19 DIAGNOSIS — J90 Pleural effusion, not elsewhere classified: Secondary | ICD-10-CM | POA: Diagnosis not present

## 2023-01-19 MED ORDER — IOHEXOL 300 MG/ML  SOLN
75.0000 mL | Freq: Once | INTRAMUSCULAR | Status: AC | PRN
  Administered 2023-01-19: 75 mL via INTRAVENOUS

## 2023-01-22 ENCOUNTER — Encounter: Payer: Self-pay | Admitting: Internal Medicine

## 2023-01-22 DIAGNOSIS — J449 Chronic obstructive pulmonary disease, unspecified: Secondary | ICD-10-CM | POA: Diagnosis not present

## 2023-01-25 ENCOUNTER — Inpatient Hospital Stay: Payer: 59

## 2023-01-25 ENCOUNTER — Encounter: Payer: Self-pay | Admitting: Internal Medicine

## 2023-01-25 ENCOUNTER — Inpatient Hospital Stay: Payer: 59 | Attending: Internal Medicine | Admitting: Internal Medicine

## 2023-01-25 VITALS — BP 105/72 | HR 57 | Temp 97.6°F | Wt 182.1 lb

## 2023-01-25 DIAGNOSIS — Z87891 Personal history of nicotine dependence: Secondary | ICD-10-CM | POA: Diagnosis not present

## 2023-01-25 DIAGNOSIS — C3491 Malignant neoplasm of unspecified part of right bronchus or lung: Secondary | ICD-10-CM

## 2023-01-25 DIAGNOSIS — E785 Hyperlipidemia, unspecified: Secondary | ICD-10-CM | POA: Insufficient documentation

## 2023-01-25 DIAGNOSIS — Z8673 Personal history of transient ischemic attack (TIA), and cerebral infarction without residual deficits: Secondary | ICD-10-CM | POA: Diagnosis not present

## 2023-01-25 DIAGNOSIS — D72819 Decreased white blood cell count, unspecified: Secondary | ICD-10-CM | POA: Insufficient documentation

## 2023-01-25 DIAGNOSIS — C3411 Malignant neoplasm of upper lobe, right bronchus or lung: Secondary | ICD-10-CM | POA: Insufficient documentation

## 2023-01-25 DIAGNOSIS — Z5112 Encounter for antineoplastic immunotherapy: Secondary | ICD-10-CM | POA: Diagnosis not present

## 2023-01-25 DIAGNOSIS — I10 Essential (primary) hypertension: Secondary | ICD-10-CM | POA: Diagnosis not present

## 2023-01-25 DIAGNOSIS — R918 Other nonspecific abnormal finding of lung field: Secondary | ICD-10-CM

## 2023-01-25 DIAGNOSIS — D649 Anemia, unspecified: Secondary | ICD-10-CM | POA: Diagnosis not present

## 2023-01-25 DIAGNOSIS — J90 Pleural effusion, not elsewhere classified: Secondary | ICD-10-CM | POA: Insufficient documentation

## 2023-01-25 DIAGNOSIS — J432 Centrilobular emphysema: Secondary | ICD-10-CM | POA: Insufficient documentation

## 2023-01-25 LAB — CBC WITH DIFFERENTIAL/PLATELET
Abs Immature Granulocytes: 0.01 10*3/uL (ref 0.00–0.07)
Basophils Absolute: 0 10*3/uL (ref 0.0–0.1)
Basophils Relative: 1 %
Eosinophils Absolute: 0.1 10*3/uL (ref 0.0–0.5)
Eosinophils Relative: 3 %
HCT: 36.3 % — ABNORMAL LOW (ref 39.0–52.0)
Hemoglobin: 12.1 g/dL — ABNORMAL LOW (ref 13.0–17.0)
Immature Granulocytes: 0 %
Lymphocytes Relative: 27 %
Lymphs Abs: 1 10*3/uL (ref 0.7–4.0)
MCH: 29.4 pg (ref 26.0–34.0)
MCHC: 33.3 g/dL (ref 30.0–36.0)
MCV: 88.1 fL (ref 80.0–100.0)
Monocytes Absolute: 0.6 10*3/uL (ref 0.1–1.0)
Monocytes Relative: 17 %
Neutro Abs: 1.8 10*3/uL (ref 1.7–7.7)
Neutrophils Relative %: 52 %
Platelets: 475 10*3/uL — ABNORMAL HIGH (ref 150–400)
RBC: 4.12 MIL/uL — ABNORMAL LOW (ref 4.22–5.81)
RDW: 13.1 % (ref 11.5–15.5)
WBC: 3.5 10*3/uL — ABNORMAL LOW (ref 4.0–10.5)
nRBC: 0 % (ref 0.0–0.2)

## 2023-01-25 LAB — COMPREHENSIVE METABOLIC PANEL
ALT: 18 U/L (ref 0–44)
AST: 19 U/L (ref 15–41)
Albumin: 3.5 g/dL (ref 3.5–5.0)
Alkaline Phosphatase: 115 U/L (ref 38–126)
Anion gap: 5 (ref 5–15)
BUN: 12 mg/dL (ref 8–23)
CO2: 27 mmol/L (ref 22–32)
Calcium: 8.9 mg/dL (ref 8.9–10.3)
Chloride: 104 mmol/L (ref 98–111)
Creatinine, Ser: 0.63 mg/dL (ref 0.61–1.24)
GFR, Estimated: >60 mL/min (ref >60)
Glucose, Bld: 103 mg/dL — ABNORMAL HIGH (ref 70–99)
Potassium: 4.4 mmol/L (ref 3.5–5.1)
Sodium: 136 mmol/L (ref 135–145)
Total Bilirubin: 0.3 mg/dL (ref 0.3–1.2)
Total Protein: 7.6 g/dL (ref 6.5–8.1)

## 2023-01-25 MED ORDER — SODIUM CHLORIDE 0.9 % IV SOLN
Freq: Once | INTRAVENOUS | Status: AC
  Filled 2023-01-25: qty 250

## 2023-01-25 MED ORDER — SODIUM CHLORIDE 0.9 % IV SOLN
1500.0000 mg | Freq: Once | INTRAVENOUS | Status: AC
  Administered 2023-01-25: 1500 mg via INTRAVENOUS
  Filled 2023-01-25: qty 30

## 2023-01-25 MED ORDER — HEPARIN SOD (PORK) LOCK FLUSH 100 UNIT/ML IV SOLN
500.0000 [IU] | Freq: Once | INTRAVENOUS | Status: AC | PRN
  Administered 2023-01-25: 500 [IU]
  Filled 2023-01-25: qty 5

## 2023-01-25 NOTE — Patient Instructions (Signed)
[facility] [place] [facility]  Discharge Instructions: Thank you for choosing [facility] to provide your oncology and hematology care.  If you have a lab appointment with the Cancer Center, please go directly to the Cancer Center and check in at the registration area.  Wear comfortable clothing and clothing appropriate for easy access to any Portacath or PICC line.   We strive to give you quality time with your provider. You may need to reschedule your appointment if you arrive late (15 or more minutes).  Arriving late affects you and other patients whose appointments are after yours.  Also, if you miss three or more appointments without notifying the office, you may be dismissed from the clinic at the provider's discretion.      For prescription refill requests, have your pharmacy contact our office and allow 72 hours for refills to be completed.    Today you received the following chemotherapy and/or immunotherapy agents Imfinzi        To help prevent nausea and vomiting after your treatment, we encourage you to take your nausea medication as directed.  BELOW ARE SYMPTOMS THAT SHOULD BE REPORTED IMMEDIATELY: *FEVER GREATER THAN 100.4 F (38 C) OR HIGHER *CHILLS OR SWEATING *NAUSEA AND VOMITING THAT IS NOT CONTROLLED WITH YOUR NAUSEA MEDICATION *UNUSUAL SHORTNESS OF BREATH *UNUSUAL BRUISING OR BLEEDING *URINARY PROBLEMS (pain or burning when urinating, or frequent urination) *BOWEL PROBLEMS (unusual diarrhea, constipation, pain near the anus) TENDERNESS IN MOUTH AND THROAT WITH OR WITHOUT PRESENCE OF ULCERS (sore throat, sores in mouth, or a toothache) UNUSUAL RASH, SWELLING OR PAIN  UNUSUAL VAGINAL DISCHARGE OR ITCHING   Items with * indicate a potential emergency and should be followed up as soon as possible or go to the Emergency Department if any problems should occur.  Please show the CHEMOTHERAPY ALERT CARD or IMMUNOTHERAPY ALERT CARD at check-in to  the Emergency Department and triage nurse.  Should you have questions after your visit or need to cancel or reschedule your appointment, please contact [facility] [place] [facility]  [phone] and follow the prompts.  Office hours are 8:00 a.m. to 4:30 p.m. Monday - Friday. Please note that voicemails left after 4:00 p.m. may not be returned until the following business day.  We are closed weekends and major holidays. You have access to a nurse at all times for urgent questions. Please call the main number to the clinic [phone] and follow the prompts.  For any non-urgent questions, you may also contact your provider using MyChart. We now offer e-Visits for anyone 18 and older to request care online for non-urgent symptoms. For details visit [email or web address].   Also download the MyChart app! Go to the app store, search "MyChart", open the app, select , and log in with your MyChart username and password.    

## 2023-01-25 NOTE — Progress Notes (Signed)
[facility] CONSULT NOTE  Patient Care Team: [name], [name] as PCP - General (Physician Assistant) [name], [facility] (Inactive) as Pharmacist (Pharmacist) [name], RN as Oncology Nurse Navigator   CANCER STAGING   Cancer Staging  Squamous cell lung cancer [facility]) Staging form: Lung, AJCC 7th Edition - Clinical: Stage IIIA (T4, N0, M0) - Signed by Michaelyn Barter, MD on 06/03/2022  CURRENT TREATMENT- Weekly Carboplatin and Taxol with RT completed on 07/27/2022.  Maintenance Durvalumab started 08/13/2022. Plan total 12 cycles.   ASSESSMENT & PLAN:  THURL KRISTIANSEN 72 y.o. 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  #RUL of lung SCCa, atleast Stage IIIA (cT4N0M0) #Encounter for antineoplastic immunotherapy -s/p bronchoscopy with EBUS by Dr. Jayme Cloud on 05/12/2022.  -Completed 5 cycles of CarboTaxol and RT on 07/27/2022. Cycle 6 was canceled due to persistent neutropenia.  Repeat CT chest done on 1/15 showed near complete resolution of right upper lobe mass, persistent.  Obstructive atelectasis seen.  No adenopathy.  Patient had excellent response to the treatment.    - CT chest with contrast (11/30/2022) was reviewed at the tumor conference.  There is interval development of marked interstitial disease and patchy airspace disease more confluent consolidative opacity in the peripheral right upper lobe progressive and interval.  In the lower lung there is ill-defined tree-in-bud nodularity with nodular areas measuring up to 1.4 cm in the posterior left lower lobe.  Features are suggestive of infectious and inflammatory etiology however disease progression cannot be excluded.  Liver lesion is stable and likely hemangioma.  Patient continues to be asymptomatic.  Cycle 6 of  Durvalumab was held.    - Repeat CT chest with contrast (01/19/2023) showed interval improvement in the diffuse interstitial lung disease seen on prior study.  But there is still some persistent interstitial patchy area of consolidative airspace in the left upper lobe.  Ill-defined nodularity in the lower lobe has largely resolved although some component of dominant nodularity persist.  There is continued progression of volume loss in the right hemithorax with consolidative opacity in the right posterior apex and suprahilar right lung.  Right upper lobe airway is no longer patent.  Could be posttreatment related versus recurrent disease.  I will obtain PET CT scan to evaluate this further.  Add case for radiology review to tumor conference this Thursday.  -Considering the significant portion of the interstitial disease has improved on the recent scan and patient is asymptomatic, I will reintroduce durvalumab.  Labs reviewed and acceptable for treatment.  Will proceed with cycle 6 today.  # Mild anemia  # Leukopenia -Likely secondary to prior chemoradiation  # Access - port placed on 05/28/2022   Orders Placed This Encounter  Procedures   NM PET Image Restag (PS) Skull Base To Thigh    Standing Status:   Future    Standing Expiration Date:   01/25/2024    Order Specific Question:   If indicated for the ordered procedure, I authorize the administration of a radiopharmaceutical per Radiology protocol    Answer:   Yes    Order Specific Question:   Preferred imaging location?    Answer:   [facility]   RTC in 1 month for MD visit, labs, cycle 7 of Durvalumab  The total time spent in the appointment was 30  minutes encounter with patients including review of chart and various tests results, discussions about plan of care and coordination of care plan   All questions were answered. The patient knows to call the clinic with any problems, questions or concerns. No barriers to learning was  detected.  Michaelyn Barter, MD 6/4/20249:41 AM   HISTORY OF PRESENTING ILLNESS:  ELTON RUNKLES 72 y.o. 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  Patient was seen today accompanied by [name].  Denies any cough, hemoptysis, shortness of breath and weight loss. Remote smoker.  Quit in 2013. Has family history of lung cancer in father.  INTERVAL HISTORY-  Patient seen today prior to cycle 6 of Durvalumab.  Accompanied by sister. He is tolerating treatments well. Patient denies fever, chills, nausea, vomiting, shortness of breath, cough, abdominal pain, bleeding, bowel or bladder issues. Energy level is good.  Appetite is good.  Denies any weight loss. Denies pain.   I have reviewed his chart and materials related to his cancer extensively and collaborated history with the patient. Summary of oncologic history is as follows: Oncology History Overview Note  History of left upper lobe lung SCC status post microwave thermal ablation in 2013 by Dr. Fredia Sorrow.  He was deemed not a surgical candidate and had transportation issues for radiation treatment.   Squamous cell lung cancer (HCC)  01/12/2022 Initial Diagnosis   Patient has been following with Dr. Welton Flakes of pulmonary for suspicious lung nodule. CT chest in March 2021 showed post ablation scarring in LUL not changed and increased atelectasis of RML and RUL. He did not follow through.   CT chest in 01/12/2022 showed  New abrupt and slightly irregular cut off of the right upper lobe bronchus with associated right upper lobe atelectasis and postobstructive changes. Findings are concerning for endobronchial malignancy    04/08/2022 PET scan   IMPRESSION: 1. Hypermetabolic central right upper lobe lung mass nearly occluding the  right upper lobe bronchus, poorly delineated on the noncontrast CT images, measuring approximately 3.8 x 2.9 cm, compatible with primary bronchogenic malignancy. 2. Postobstructive pneumonia/atelectasis throughout the basilar right upper lobe. 3. No hypermetabolic metastatic thoracic adenopathy or distant metastatic disease. 4. Stable mild post ablation change in the left upper lobe with no metabolic evidence of local tumor recurrence in this location. 5. Dilated 4.4 cm ascending thoracic aorta. Recommend annual imaging followup by CTA or MRA   05/12/2022 Procedure   Procedure was delayed due to miscommunication about holding Plavix. S/p bronchoscopy with EBUS by Dr. Jayme Cloud. Op note mentions significant bulky adenopathy in the subcarinal space and in the right hilum.    05/12/2022 Pathology Results   DIAGNOSIS:  A.  LUNG, RIGHT UPPER LOBE; ENB-ASSISTED BIOPSY:  - SQUAMOUS CELL CARCINOMA.   LUNG, RIGHT UPPER LOBE; EBUS-ASSISTED BRUSHING:  - POSITIVE FOR MALIGNANCY.  - SQUAMOUS CELL CARCINOMA IS PRESENT.   LUNG, RIGHT UPPER LOBE; EBUS-ASSISTED LAVAGE:  - POSITIVE FOR MALIGNANCY.  - SQUAMOUS CELL CARCINOMA IS PRESENT  SUBCARINAL SPACE, RIGHT; EBUS-ASSISTED FNA:  - POSITIVE FOR MALIGNANCY.  - SQUAMOUS CELL CARCINOMA IS PRESENT   05/18/2022 Cancer Staging   Staging form: Lung, AJCC 7th Edition - Clinical: Stage IIIA (T4, N0, M0) - Signed by Michaelyn Barter, MD on 06/03/2022   05/21/2022 Imaging   MRI brain  No  evidence of metastatic disease in the brain. 2. Small enhancing foci in the right parietal and right occipital calvarium, which are indeterminate but could represent small osseous metastases. Attention on follow-up   06/03/2022 - 07/27/2022 Chemotherapy   Patient is on Treatment Plan : LUNG Carboplatin + Paclitaxel + XRT q7d      08/03/2022 -  Chemotherapy   Patient is on Treatment Plan : LUNG NSCLC Durvalumab (1500) q28d        MEDICAL HISTORY:  Past Medical  History:  Diagnosis Date   Alcoholism (HCC)    COPD (chronic obstructive pulmonary disease) (HCC)    SEVERE COPD -OCCASIONALLY USES OXYGEN AT NIGHT-DOES NOT USE INHALERS ON REGULAR BASIS   Elevated cholesterol    GERD (gastroesophageal reflux disease)    HOH (hard of hearing)    Hypertension    Lung cancer (HCC)    LEFT UPPER LUNG CARCINOMA-NOT A CANDIDATE FOR SURGICAL RESECTION BECAUSE OF HIS COPD AND POOR CANDIDATE FOR RADIATION BECAUSE OF TRANSPORTATION PROBLEMS   Seizures (HCC)    CHRONIC DILATIN - PT STATES NO SEIZURES IN PAST COUPLE OF YEARS; PT STATES HIS SEIZURES CAUSED NUMBNESS OF ARMS AND HANDS AND NOT ABLE TO MOVE HIS ARMS   Stroke (HCC)    2 TO 3 YRS AGO-AFFECTED HIS SPEECH--ABLE TO AMBULATE WITHOUT ASSIST AND DOES YARD WORK.  DECREASED HEARING IN BOTH EARS SINCE STROKE.   Vitamin D deficiency     SURGICAL HISTORY: Past Surgical History:  Procedure Laterality Date   CAROTID SURGERY 2009 -LEFT     COLON POLYP EXCISION     COLON SURGERY     COLONOSCOPY WITH PROPOFOL N/A 04/18/2017   Procedure: COLONOSCOPY WITH PROPOFOL;  Surgeon: Scot Jun, MD;  Location: [facility] ENDOSCOPY;  Service: Endoscopy;  Laterality: N/A;   ESOPHAGOGASTRODUODENOSCOPY     IR GENERIC HISTORICAL  01/21/2016   IR RADIOLOGIST EVAL & MGMT 01/21/2016 Irish Lack, MD [facility] INTERV RAD   IR GENERIC HISTORICAL  10/22/2014   IR RADIOLOGIST EVAL & MGMT 10/22/2014 Irish Lack, MD [facility] INTERV RAD   IR IMAGING GUIDED PORT INSERTION  05/28/2022   IR RADIOLOGIST EVAL & MGMT  02/02/2017   IR RADIOLOGIST EVAL & MGMT  03/08/2018   percutaneous biopsy     VIDEO BRONCHOSCOPY WITH ENDOBRONCHIAL ULTRASOUND Right 05/12/2022   Procedure: VIDEO BRONCHOSCOPY WITH ENDOBRONCHIAL ULTRASOUND;  Surgeon: Salena Saner, MD;  Location: [facility] ORS;  Service: Cardiopulmonary;  Laterality: Right;    SOCIAL HISTORY: Social History   Socioeconomic History   Marital status: Single    Spouse name: Not on file   Number of  children: Not on file   Years of education: Not on file   Highest education level: Not on file  Occupational History   Not on file  Tobacco Use   Smoking status: Former    Packs/day: 1.00    Years: 35.00    Additional pack years: 0.00    Total pack years: 35.00    Types: Cigarettes    Quit date: 12/22/2011    Years since quitting: 11.1   Smokeless tobacco: Never  Vaping Use   Vaping Use: Never used  Substance and Sexual Activity   Alcohol use: Yes    Comment: occosionally    Drug use: No   Sexual activity: Not on file  Other Topics Concern   Not on file  Social History Narrative   Not on file   Social Determinants of Health   Financial Resource Strain: Low Risk  (  11/28/2020)   Overall Financial Resource Strain (CARDIA)    Difficulty of Paying Living Expenses: Not hard at all  Food Insecurity: No Food Insecurity (06/15/2022)   Hunger Vital Sign    Worried About Running Out of Food in the Last Year: Never true    Ran Out of Food in the Last Year: Never true  Transportation Needs: No Transportation Needs (06/15/2022)   PRAPARE - Administrator, Civil Service (Medical): No    Lack of Transportation (Non-Medical): No  Physical Activity: Inactive (06/15/2022)   Exercise Vital Sign    Days of Exercise per Week: 0 days    Minutes of Exercise per Session: 0 min  Stress: Stress Concern Present (06/15/2022)   [employer] of Occupational Health - Occupational Stress Questionnaire    Feeling of Stress : To some extent  Social Connections: Socially Isolated (06/15/2022)   Social Connection and Isolation Panel [NHANES]    Frequency of Communication with Friends and Family: More than three times a week    Frequency of Social Gatherings with Friends and Family: More than three times a week    Attends Religious Services: Never    Database administrator or Organizations: No    Attends Banker Meetings: Never    Marital Status: Never married  Intimate  Partner Violence: Unknown (06/15/2022)   Humiliation, Afraid, Rape, and Kick questionnaire    Fear of Current or Ex-Partner: No    Emotionally Abused: Not on file    Physically Abused: No    Sexually Abused: No    FAMILY HISTORY: Family History  Problem Relation Age of Onset   Colon polyps Sister     ALLERGIES:  has No Known Allergies.  MEDICATIONS:  Current Outpatient Medications  Medication Sig Dispense Refill   aspirin 81 MG tablet Take 81 mg by mouth daily.     atorvastatin (LIPITOR) 80 MG tablet Take 1 tablet (80 mg total) by mouth daily. 90 tablet 3   budesonide-formoterol (SYMBICORT) 160-4.5 MCG/ACT inhaler Inhale 1 puff into the lungs 2 (two) times daily. 1 each 5   Cholecalciferol (VITAMIN D) 2000 UNITS CAPS Take by mouth. VITAMIN D 3 2000 IU  ONE SOFTGEL DAILY  --OTC     clopidogrel (PLAVIX) 75 MG tablet Take 1 tablet (75 mg total) by mouth daily. 90 tablet 3   enalapril (VASOTEC) 20 MG tablet Take 1 tablet (20 mg total) by mouth daily. 90 tablet 3   ezetimibe (ZETIA) 10 MG tablet Take 1 tablet (10 mg total) by mouth daily. 90 tablet 3   Na Sulfate-K Sulfate-Mg Sulf 17.5-3.13-1.6 GM/177ML SOLN Take by mouth.     OXYGEN Inhale into the lungs. At night     pantoprazole (PROTONIX) 40 MG tablet Take 1 tablet (40 mg total) by mouth daily. IN AM 90 tablet 0   phenytoin (DILANTIN) 100 MG ER capsule Take 4 capsules (400 mg total) by mouth at bedtime. 360 capsule 0   No current facility-administered medications for this visit.   Facility-Administered Medications Ordered in Other Visits  Medication Dose Route Frequency Provider Last Rate Last Admin   0.9 %  sodium chloride infusion   Intravenous Once Michaelyn Barter, MD       durvalumab (IMFINZI) 1,500 mg in sodium chloride 0.9 % 100 mL chemo infusion  1,500 mg Intravenous Once Michaelyn Barter, MD       heparin lock flush 100 unit/mL  500 Units Intracatheter Once PRN Michaelyn Barter, MD  REVIEW OF SYSTEMS:   Pertinent  information mentioned in HPI All other systems were reviewed with the patient and are negative.  PHYSICAL EXAMINATION: ECOG PERFORMANCE STATUS: 2 - Symptomatic, <50% confined to bed  Vitals:   01/25/23 0913 01/25/23 0915  BP: 101/73 105/72  Pulse: (!) 57   Temp: 97.6 F (36.4 C)   SpO2: 100%      Filed Weights   01/25/23 0913  Weight: 182 lb 1.6 oz (82.6 kg)       GENERAL:alert, no distress and comfortable SKIN: skin color, texture, turgor are normal, no rashes or significant lesions EYES: normal, conjunctiva are pink and non-injected, sclera clear OROPHARYNX:no exudate, no erythema and lips, buccal mucosa, and tongue normal  NECK: supple, thyroid normal size, non-tender, without nodularity LYMPH:  no palpable lymphadenopathy in the cervical, axillary or inguinal LUNGS: clear to auscultation and percussion with normal breathing effort HEART: regular rate & rhythm and no murmurs and no lower extremity edema ABDOMEN:abdomen soft, non-tender and normal bowel sounds Musculoskeletal:no cyanosis of digits and no clubbing  PSYCH: alert & oriented x 3 with fluent speech NEURO: no focal motor/sensory deficits  LABORATORY DATA:  I have reviewed the data as listed Lab Results  Component Value Date   WBC 3.5 (L) 01/25/2023   HGB 12.1 (L) 01/25/2023   HCT 36.3 (L) 01/25/2023   MCV 88.1 01/25/2023   PLT 475 (H) 01/25/2023   Recent Labs    12/03/22 0945 12/31/22 0916 01/25/23 0903  NA 129* 136 136  K 4.4 3.9 4.4  CL 97* 101 104  CO2 26 25 27   GLUCOSE 115* 88 103*  BUN 10 12 12   CREATININE 0.71 0.45* 0.63  CALCIUM 9.0 9.1 8.9  GFRNONAA >60 >60 >60  PROT 7.8 7.7 7.6  ALBUMIN 3.5 3.6 3.5  AST 21 19 19   ALT 17 19 18   ALKPHOS 167* 108 115  BILITOT 0.3 0.6 0.3    RADIOGRAPHIC STUDIES: I have personally reviewed the radiological images as listed and agreed with the findings in the report. CT Chest W Contrast  Result Date: 01/24/2023 CLINICAL DATA:  Non-small-cell  lung cancer. Restaging. * Tracking Code: BO * EXAM: CT CHEST WITH CONTRAST TECHNIQUE: Multidetector CT imaging of the chest was performed during intravenous contrast administration. RADIATION DOSE REDUCTION: This exam was performed according to the departmental dose-optimization program which includes automated exposure control, adjustment of the mA and/or kV according to patient size and/or use of iterative reconstruction technique. CONTRAST:  75mL OMNIPAQUE IOHEXOL 300 MG/ML  SOLN COMPARISON:  11/30/2022 FINDINGS: Cardiovascular: The heart size is normal. No substantial pericardial effusion. Coronary artery calcification is evident. Moderate atherosclerotic calcification is noted in the wall of the thoracic aorta. Right Port-A-Cath tip is positioned at the SVC/RA junction. Mediastinum/Nodes: No mediastinal lymphadenopathy. There is no hilar lymphadenopathy. The esophagus has normal imaging features. There is no axillary lymphadenopathy. Lungs/Pleura: Centrilobular and paraseptal emphysema evident. Continued progression of volume loss in the right hemithorax with consolidative opacity in the right posterior apex and suprahilar right lung. Right upper lobe airway is no longer patent. The diffuse interstitial lung disease identified as new on the previous study has decreased in the interval although there is persistent interstitial and patchy areas of consolidative airspace opacity in the left upper lobe. Ill-defined nodularity in the lower lungs has largely resolved although some of the more dominant nodular component seen previously do persist. The 14 mm left lower lobe nodule identified on the previous exam is 6 mm today  on image 104/3. Persistent 5 mm nodule is seen in the anterior left lower lobe on 59/3. Trace right pleural effusion is new in the interval. Upper Abdomen: 13 mm hypervascular lesion in the left liver is stable, most likely benign. No followup imaging is recommended. Stable nodular thickening of  both adrenal glands. Musculoskeletal: No worrisome lytic or sclerotic osseous abnormality. IMPRESSION: 1. Continued progression of volume loss in the right hemithorax with consolidative opacity in the right posterior apex and suprahilar right lung. Right upper lobe airway is no longer patent. These findings may reflect evolution of post treatment/radiation change although interval occlusion of the right upper lobe bronchus is somewhat concerning. PET-CT could be used to further evaluate for recurrent disease, as clinically warranted. 2. The diffuse interstitial lung disease identified as new on the previous study has decreased in the interval although there is persistent interstitial and patchy areas of consolidative airspace opacity in the left upper lobe. 3. Ill-defined nodularity in the lower lungs has largely resolved although some of the more dominant nodular components seen previously do persist. 4. Trace right pleural effusion is new in the interval. 5. Aortic Atherosclerosis (ICD10-I70.0) and Emphysema (ICD10-J43.9). Electronically Signed   By: Kennith Center M.D.   On: 01/24/2023 07:50

## 2023-01-27 ENCOUNTER — Other Ambulatory Visit: Payer: Self-pay | Admitting: Internal Medicine

## 2023-01-27 ENCOUNTER — Telehealth: Payer: Self-pay

## 2023-01-27 ENCOUNTER — Other Ambulatory Visit: Payer: 59

## 2023-01-27 DIAGNOSIS — C3491 Malignant neoplasm of unspecified part of right bronchus or lung: Secondary | ICD-10-CM

## 2023-01-27 NOTE — Telephone Encounter (Signed)
Per Dr. Jayme Cloud via epic secure chat- patient needs appt to discuss repeat bronch.  Lm for patient's sister, Linda(DPR).

## 2023-01-28 ENCOUNTER — Ambulatory Visit: Payer: 59 | Admitting: Internal Medicine

## 2023-01-28 ENCOUNTER — Other Ambulatory Visit: Payer: 59

## 2023-01-28 ENCOUNTER — Ambulatory Visit: Payer: 59

## 2023-01-28 NOTE — Telephone Encounter (Signed)
Spoke to patient's sister, [name] and scheduled appt 02/09/2023 at 11:30. Linda requested morning appt.  Nothing further needed.

## 2023-02-04 ENCOUNTER — Ambulatory Visit: Payer: 59

## 2023-02-07 ENCOUNTER — Ambulatory Visit
Admission: RE | Admit: 2023-02-07 | Discharge: 2023-02-07 | Disposition: A | Discharge status: Home / Self Care | Payer: 59 | Source: Ambulatory Visit | Attending: Internal Medicine | Admitting: Internal Medicine

## 2023-02-07 DIAGNOSIS — G319 Degenerative disease of nervous system, unspecified: Secondary | ICD-10-CM | POA: Diagnosis not present

## 2023-02-07 DIAGNOSIS — C3491 Malignant neoplasm of unspecified part of right bronchus or lung: Secondary | ICD-10-CM | POA: Insufficient documentation

## 2023-02-07 MED ORDER — GADOBUTROL 1 MMOL/ML IV SOLN
8.0000 mL | Freq: Once | INTRAVENOUS | Status: AC | PRN
  Administered 2023-02-07: 8 mL via INTRAVENOUS

## 2023-02-08 ENCOUNTER — Ambulatory Visit
Admission: RE | Admit: 2023-02-08 | Discharge: 2023-02-08 | Disposition: A | Discharge status: Home / Self Care | Payer: 59 | Source: Ambulatory Visit | Attending: Internal Medicine | Admitting: Internal Medicine

## 2023-02-08 ENCOUNTER — Telehealth: Payer: Self-pay

## 2023-02-08 DIAGNOSIS — C3491 Malignant neoplasm of unspecified part of right bronchus or lung: Secondary | ICD-10-CM | POA: Insufficient documentation

## 2023-02-08 DIAGNOSIS — Z923 Personal history of irradiation: Secondary | ICD-10-CM | POA: Diagnosis not present

## 2023-02-08 DIAGNOSIS — C3411 Malignant neoplasm of upper lobe, right bronchus or lung: Secondary | ICD-10-CM | POA: Diagnosis not present

## 2023-02-08 LAB — GLUCOSE, CAPILLARY: Glucose-Capillary: 78 mg/dL (ref 70–99)

## 2023-02-08 MED ORDER — FLUDEOXYGLUCOSE F - 18 (FDG) INJECTION
10.2600 | Freq: Once | INTRAVENOUS | Status: AC | PRN
  Administered 2023-02-08: 10.26 via INTRAVENOUS

## 2023-02-08 NOTE — Telephone Encounter (Signed)
CSW received a vm from patient's sister, Uwe Kois, asking about Advance Directives.  Left vm.

## 2023-02-09 ENCOUNTER — Encounter: Payer: Self-pay | Admitting: Pulmonary Disease

## 2023-02-09 ENCOUNTER — Ambulatory Visit (INDEPENDENT_AMBULATORY_CARE_PROVIDER_SITE_OTHER): Payer: 59 | Admitting: Pulmonary Disease

## 2023-02-09 VITALS — BP 126/80 | HR 62 | Temp 97.8°F | Ht 67.0 in | Wt 184.2 lb

## 2023-02-09 DIAGNOSIS — J9811 Atelectasis: Secondary | ICD-10-CM

## 2023-02-09 DIAGNOSIS — C3492 Malignant neoplasm of unspecified part of left bronchus or lung: Secondary | ICD-10-CM

## 2023-02-09 NOTE — Patient Instructions (Signed)
Your scan appears to be getting better.   If there is any new information after the radiologist reviews it, we will give you a call however at this point I do not think that you need any procedures done.  Dr. Alena Bills is going to repeat scans in another 3 months.  She is going to keep you on the same therapy you are on as it seems like you are responding to it.  We will see you in follow-up here on an as-needed basis.  Dr. Michae Kava will let us know if you need a follow-up here.  Please continue follow-up with Dr. Welton Flakes as you are doing.

## 2023-02-09 NOTE — Progress Notes (Signed)
Subjective:    Patient ID: Derek Webb, male    DOB: 1951/07/13, 72 y.o.   MRN: [id number]  Patient Care Team: Alan Ripper as PCP - General (Physician Assistant) [name], [facility] (Inactive) as Pharmacist (Pharmacist) [name], RN as Oncology Nurse Navigator Michaelyn Barter, MD as Consulting Physician (Oncology) Yevonne Pax, MD as Consulting Physician (Internal Medicine)  Chief Complaint  Patient presents with   Follow-up    [facility]. CT on 01/19/2023    HPI The patient is a 72 year old former smoker whom we have previously evaluated here for lung mass.  Patient underwent bronchoscopy with endobronchial ultrasound on 12 May 2022.  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  The patient's primary pulmonologist is Dr. Freda Munro.  The patient has been rereferred for consideration of repeat bronchoscopy by his primary oncologist with Dr. Michaelyn Barter.  The patient was treated with weekly carboplatin and Taxol with concurrent RT completed in December 2023.  He is on maintenance Durvalumab started on 13 August 2022 for a plan of 12 cycles on this.  The patient was discussed during tumor board after a restaging CT scan showed continued progression of volume loss in the right hemithorax with consolidative opacity in the right posterior apex and suprahilar right lung.  Findings may reflect evolution of posttreatment/radiation change.  We were asked to consider repeat bronchoscopy to exclude recurrence.  The patient underwent PET/CT on 08 February 2023.  This exam has not been reviewed by the radiologist.  On my review however the areas of concern do not appear to have any significant uptake whatsoever.  I suspect that the changes noted are postradiation/posttreatment change.  This was discussed with Dr. Alena Bills.  The patient presents with his sister today, she assist him with his  follow-up appointments.  He has difficulties due to being hard of hearing with prior strokes and seizure disorder.  We discussed the findings on the PET/CT and that at present I do not think he needs a bronchoscopy.  I did however state to them that the radiologist has not reviewed the PET/CT but that we will be contacting them once final results are available.  The patient does not endorse any issues with cough, hemoptysis, shortness of breath, weight loss or anorexia.  He does not endorse any other symptomatology.   Review of Systems A 10 point review of systems was performed and it is as noted above otherwise negative.   Patient Active Problem List   Diagnosis Date Noted   Opacity of lung on imaging study 12/31/2022   Hyponatremia 12/03/2022   Anemia due to antineoplastic chemotherapy 07/05/2022   Chemotherapy induced neutropenia (HCC) 07/05/2022   History of lung cancer 04/21/2020   Seizures (HCC) 07/20/2019   Encounter for antineoplastic immunotherapy 07/20/2019   Need for vaccination against Streptococcus pneumoniae using pneumococcal conjugate vaccine 13 03/05/2019   GERD (gastroesophageal reflux disease) 02/08/2018   Obstructive chronic bronchitis without exacerbation 02/08/2018   Encounter for general adult medical examination with abnormal findings 12/18/2017   Atherosclerosis of aorta (HCC) 12/18/2017   Cerebrovascular accident (CVA) (HCC) 11/01/2017   HOH (hard of hearing) 11/01/2017   Essential hypertension 11/01/2017   Hyperlipidemia 11/01/2017   Hx of adenomatous polyp of colon 12/16/2016   Squamous cell lung cancer (HCC)    Lung cancer, 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HCC) 05/07/2012    Social History   Tobacco Use  Smoking status: Former    Packs/day: 1.00    Years: 35.00    Additional pack years: 0.00    Total pack years: 35.00    Types: Cigarettes    Quit date: 12/22/2011    Years since quitting: 11.1   Smokeless tobacco: Never  Substance Use Topics   Alcohol use: Yes     Comment: occosionally     No Known Allergies  Current Meds  Medication Sig   aspirin 81 MG tablet Take 81 mg by mouth daily.   atorvastatin (LIPITOR) 80 MG tablet Take 1 tablet (80 mg total) by mouth daily.   budesonide-formoterol (SYMBICORT) 160-4.5 MCG/ACT inhaler Inhale 1 puff into the lungs 2 (two) times daily.   Cholecalciferol (VITAMIN D) 2000 UNITS CAPS Take by mouth. VITAMIN D 3 2000 IU  ONE SOFTGEL DAILY  --OTC   clopidogrel (PLAVIX) 75 MG tablet Take 1 tablet (75 mg total) by mouth daily.   enalapril (VASOTEC) 20 MG tablet Take 1 tablet (20 mg total) by mouth daily.   ezetimibe (ZETIA) 10 MG tablet Take 1 tablet (10 mg total) by mouth daily.   Na Sulfate-K Sulfate-Mg Sulf 17.5-3.13-1.6 GM/177ML SOLN Take by mouth.   OXYGEN Inhale into the lungs. At night   pantoprazole (PROTONIX) 40 MG tablet Take 1 tablet (40 mg total) by mouth daily. IN AM   phenytoin (DILANTIN) 100 MG ER capsule Take 4 capsules (400 mg total) by mouth at bedtime.    Immunization History  Administered Date(s) Administered   Influenza Inj Mdck Quad Pf 06/21/2022   Influenza Inj Mdck Quad With Preservative 07/21/2018   Influenza Split 05/06/2012   Influenza,inj,quad, With Preservative 05/21/2019   Influenza-Unspecified 06/13/2017   PFIZER(Purple Top)SARS-COV-2 Vaccination 03/11/2020, 03/31/2020, 10/07/2020   Pneumococcal Polysaccharide-23 04/20/2020        Objective:   BP 126/80 (BP Location: Left Arm, Cuff Size: Normal)   Pulse 62   Temp 97.8 F (36.6 C)   Ht 5\' 7"  (1.702 m)   Wt 184 lb 3.2 oz (83.6 kg)   SpO2 98%   BMI 28.85 kg/m   SpO2: 98 % O2 Device: None (Room air)  GENERAL: Well-developed, well-nourished gentleman, no acute distress. HEAD: Normocephalic, atraumatic.  EYES: Pupils equal, round, reactive to light.  No scleral icterus.  MOUTH: Edentulous NECK: Supple. No thyromegaly. Trachea midline. No JVD.  No adenopathy. PULMONARY: Good air entry bilaterally.  No adventitious  sounds. CARDIOVASCULAR: S1 and S2. Regular rate and rhythm.  No rubs, murmurs or gallops heard. ABDOMEN: Benign. MUSCULOSKELETAL: No joint deformity, no clubbing, no edema.  NEUROLOGIC: No overt focal deficit, no gait disturbance, speech is fluent. SKIN: Intact,warm,dry. PSYCH: Flat affect, normal behavior.       Assessment & Plan:     ICD-10-CM   1. Squamous cell carcinoma of left lung (HCC)  C34.92    By PET/CT no obvious recurrence Appears to have posttreatment/postradiation changes Continue to monitor expectantly No need for re- bronchoscopy at present    2. Atelectasis of right lung  J98.11    Right upper lobe was compromised from the beginning Radiation fibrotic changes may have completed collapse     Will await the final radiology report.  Will see the patient in follow-up on an as-needed basis.  The patient will continue to follow with his primary pulmonologist Dr. Freda Munro.    Gailen Shelter, MD Advanced Bronchoscopy PCCM [facility]    *This note was dictated using voice recognition software/Dragon.  Despite best efforts  to proofread, errors can occur which can change the meaning. Any transcriptional errors that result from this process are unintentional and may not be fully corrected at the time of dictation.

## 2023-02-12 ENCOUNTER — Encounter: Payer: Self-pay | Admitting: Pulmonary Disease

## 2023-02-15 ENCOUNTER — Inpatient Hospital Stay: Payer: 59 | Admitting: Licensed Clinical Social Worker

## 2023-02-15 NOTE — Progress Notes (Signed)
[facility] CSW Progress Note  Clinical Child psychotherapist contacted caregiver by phone to discuss advance directives.  Patient's sister, Derek Webb, said patient would like to have these documents completed.  CSW provided education and are scheduled to meet at the next AD Clinic on 7/11.  She has to meet at [facility] because patient has another appointment that morning.    Dorothey Baseman, LCSW Clinical Social Worker [facility]

## 2023-02-17 ENCOUNTER — Inpatient Hospital Stay: Payer: 59

## 2023-02-17 NOTE — Progress Notes (Signed)
[facility] CSW Progress Note  Clinical Child psychotherapist returned sister, Linda's call.  She wanted to verify the location of Advance Directive Clinic.  It will be at the [facility] on 03/03/23 at 9am.  [name] is unable to attend the Clinic on 7/25 due to surgery.   Dorothey Baseman, LCSW Clinical Social Worker [facility]    Patient is participating in a Managed Medicaid Plan:  Yes

## 2023-02-21 DIAGNOSIS — J449 Chronic obstructive pulmonary disease, unspecified: Secondary | ICD-10-CM | POA: Diagnosis not present

## 2023-02-22 ENCOUNTER — Inpatient Hospital Stay: Payer: 59

## 2023-02-22 ENCOUNTER — Inpatient Hospital Stay (HOSPITAL_BASED_OUTPATIENT_CLINIC_OR_DEPARTMENT_OTHER): Payer: 59 | Admitting: Internal Medicine

## 2023-02-22 ENCOUNTER — Inpatient Hospital Stay: Payer: 59 | Attending: Internal Medicine

## 2023-02-22 VITALS — BP 110/60 | HR 77 | Temp 98.3°F | Wt 184.7 lb

## 2023-02-22 DIAGNOSIS — Z79620 Long term (current) use of immunosuppressive biologic: Secondary | ICD-10-CM | POA: Insufficient documentation

## 2023-02-22 DIAGNOSIS — E785 Hyperlipidemia, unspecified: Secondary | ICD-10-CM | POA: Diagnosis not present

## 2023-02-22 DIAGNOSIS — I10 Essential (primary) hypertension: Secondary | ICD-10-CM | POA: Insufficient documentation

## 2023-02-22 DIAGNOSIS — N429 Disorder of prostate, unspecified: Secondary | ICD-10-CM | POA: Insufficient documentation

## 2023-02-22 DIAGNOSIS — C3491 Malignant neoplasm of unspecified part of right bronchus or lung: Secondary | ICD-10-CM

## 2023-02-22 DIAGNOSIS — D72819 Decreased white blood cell count, unspecified: Secondary | ICD-10-CM | POA: Diagnosis not present

## 2023-02-22 DIAGNOSIS — J440 Chronic obstructive pulmonary disease with acute lower respiratory infection: Secondary | ICD-10-CM | POA: Insufficient documentation

## 2023-02-22 DIAGNOSIS — Z87891 Personal history of nicotine dependence: Secondary | ICD-10-CM | POA: Diagnosis not present

## 2023-02-22 DIAGNOSIS — D649 Anemia, unspecified: Secondary | ICD-10-CM | POA: Insufficient documentation

## 2023-02-22 DIAGNOSIS — Z5112 Encounter for antineoplastic immunotherapy: Secondary | ICD-10-CM | POA: Diagnosis not present

## 2023-02-22 DIAGNOSIS — C3411 Malignant neoplasm of upper lobe, right bronchus or lung: Secondary | ICD-10-CM | POA: Diagnosis not present

## 2023-02-22 LAB — PSA: Prostatic Specific Antigen: 3.14 ng/mL (ref 0.00–4.00)

## 2023-02-22 LAB — CBC WITH DIFFERENTIAL/PLATELET
Abs Immature Granulocytes: 0.01 10*3/uL (ref 0.00–0.07)
Basophils Absolute: 0.1 10*3/uL (ref 0.0–0.1)
Basophils Relative: 1 %
Eosinophils Absolute: 0.1 10*3/uL (ref 0.0–0.5)
Eosinophils Relative: 3 %
HCT: 36.2 % — ABNORMAL LOW (ref 39.0–52.0)
Hemoglobin: 12.4 g/dL — ABNORMAL LOW (ref 13.0–17.0)
Immature Granulocytes: 0 %
Lymphocytes Relative: 30 %
Lymphs Abs: 1 10*3/uL (ref 0.7–4.0)
MCH: 29.9 pg (ref 26.0–34.0)
MCHC: 34.3 g/dL (ref 30.0–36.0)
MCV: 87.2 fL (ref 80.0–100.0)
Monocytes Absolute: 0.6 10*3/uL (ref 0.1–1.0)
Monocytes Relative: 18 %
Neutro Abs: 1.7 10*3/uL (ref 1.7–7.7)
Neutrophils Relative %: 48 %
Platelets: 413 10*3/uL — ABNORMAL HIGH (ref 150–400)
RBC: 4.15 MIL/uL — ABNORMAL LOW (ref 4.22–5.81)
RDW: 13.7 % (ref 11.5–15.5)
WBC: 3.5 10*3/uL — ABNORMAL LOW (ref 4.0–10.5)
nRBC: 0 % (ref 0.0–0.2)

## 2023-02-22 LAB — COMPREHENSIVE METABOLIC PANEL
ALT: 19 U/L (ref 0–44)
AST: 18 U/L (ref 15–41)
Albumin: 3.7 g/dL (ref 3.5–5.0)
Alkaline Phosphatase: 109 U/L (ref 38–126)
Anion gap: 8 (ref 5–15)
BUN: 12 mg/dL (ref 8–23)
CO2: 25 mmol/L (ref 22–32)
Calcium: 9.2 mg/dL (ref 8.9–10.3)
Chloride: 102 mmol/L (ref 98–111)
Creatinine, Ser: 0.65 mg/dL (ref 0.61–1.24)
GFR, Estimated: >60 mL/min (ref >60)
Glucose, Bld: 101 mg/dL — ABNORMAL HIGH (ref 70–99)
Potassium: 3.8 mmol/L (ref 3.5–5.1)
Sodium: 135 mmol/L (ref 135–145)
Total Bilirubin: 0.3 mg/dL (ref 0.3–1.2)
Total Protein: 7.9 g/dL (ref 6.5–8.1)

## 2023-02-22 MED ORDER — HEPARIN SOD (PORK) LOCK FLUSH 100 UNIT/ML IV SOLN
500.0000 [IU] | Freq: Once | INTRAVENOUS | Status: AC | PRN
  Administered 2023-02-22: 500 [IU]
  Filled 2023-02-22: qty 5

## 2023-02-22 MED ORDER — SODIUM CHLORIDE 0.9 % IV SOLN
Freq: Once | INTRAVENOUS | Status: AC
  Filled 2023-02-22: qty 250

## 2023-02-22 MED ORDER — SODIUM CHLORIDE 0.9% FLUSH
10.0000 mL | INTRAVENOUS | Status: DC | PRN
  Administered 2023-02-22: 10 mL
  Filled 2023-02-22: qty 10

## 2023-02-22 MED ORDER — SODIUM CHLORIDE 0.9 % IV SOLN
1500.0000 mg | Freq: Once | INTRAVENOUS | Status: AC
  Administered 2023-02-22: 1500 mg via INTRAVENOUS
  Filled 2023-02-22: qty 30

## 2023-02-22 NOTE — Progress Notes (Signed)
[facility] CONSULT NOTE  Patient Care Team: [name], [name] as PCP - General (Physician Assistant) [name], [facility] (Inactive) as Pharmacist (Pharmacist) [name], RN as Oncology Nurse Navigator Michaelyn Barter, MD as Consulting Physician (Oncology) Yevonne Pax, MD as Consulting Physician (Internal Medicine)   CANCER STAGING   Cancer Staging  Squamous cell lung cancer [facility]) Staging form: Lung, AJCC 7th Edition - Clinical: Stage IIIA (T4, N0, M0) - Signed by Michaelyn Barter, MD on 06/03/2022  CURRENT TREATMENT- Weekly Carboplatin and Taxol with RT completed on 07/27/2022.  Maintenance Durvalumab started 08/13/2022. Plan total 12 cycles.   ASSESSMENT & PLAN:  Derek Webb 72 y.o. 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  #RUL of lung SCCa, atleast Stage IIIA (cT4N0M0) #Encounter for antineoplastic immunotherapy -s/p bronchoscopy with EBUS by Dr. Jayme Cloud on 05/12/2022.  -Completed 5 cycles of CarboTaxol and RT on 07/27/2022. Cycle 6 was canceled due to persistent neutropenia.  Repeat CT chest done on 1/15 showed near complete resolution of right upper lobe mass, persistent.  Obstructive atelectasis seen.  No adenopathy.  Patient had excellent response to the treatment.    - CT chest with contrast (11/30/2022) was reviewed at the tumor conference.  There is interval development of marked interstitial disease and patchy airspace disease more confluent consolidative opacity in the peripheral right upper lobe progressive and interval.  In the lower lung there is ill-defined tree-in-bud nodularity with nodular areas measuring up to 1.4 cm in the posterior left lower lobe.  Features are suggestive of infectious and inflammatory etiology however disease progression  cannot be excluded.  Liver lesion is stable and likely hemangioma.  Patient continues to be asymptomatic.  Cycle 6 of Durvalumab was held.    - Repeat CT chest with contrast (01/19/2023) showed interval improvement in the diffuse interstitial lung disease seen on prior study.  But there is still some persistent interstitial patchy area of consolidative airspace in the left upper lobe.  Ill-defined nodularity in the lower lobe has largely resolved although some component of dominant nodularity persist.  There is continued progression of volume loss in the right hemithorax with consolidative opacity in the right posterior apex and suprahilar right lung.  Right upper lobe airway is no longer patent.  Could be posttreatment related versus recurrent disease.   -PET/CT (02/08/2023) showed radiation changes in the right upper and medial left upper lobe.  No suspicious findings for residual or recurrent lung cancer.  Focal left prostate hypermetabolic some raising possibility of prostate cancer.  MRI brain with no evidence of intracranial metastasis.  Unchanged subcentimeter enhancing focus in the right parietal calvarium nonspecific.  Imaging was discussed with the patient and his sister.  Labs reviewed and acceptable for treatment.  Will proceed with 7 of Durvalumab.  Follow-up in 1 month for cycle 8.  # Incidental hypermetabolism in prostate -Denies any urinary symptoms.  Check PSA level -Referral to urology  # Mild anemia  # Leukopenia -Likely secondary to prior chemoradiation  # Access - port placed on 05/28/2022   Orders Placed This Encounter  Procedures   CBC with Differential    Standing Status:   Future    Standing Expiration Date:   03/21/2024   Comprehensive metabolic panel    Standing Status:   Future    Standing  Expiration Date:   03/21/2024   CBC with Differential    Standing Status:   Future    Standing Expiration Date:   04/18/2024   Comprehensive metabolic panel    Standing  Status:   Future    Standing Expiration Date:   04/18/2024   T4    Standing Status:   Future    Standing Expiration Date:   04/18/2024   TSH    Standing Status:   Future    Standing Expiration Date:   04/18/2024   PSA    Standing Status:   Future    Standing Expiration Date:   02/22/2024   RTC in 1 month for MD visit, labs, cycle 8 of Durvalumab  The total time spent in the appointment was 30 minutes encounter with patients including review of chart and various tests results, discussions about plan of care and coordination of care plan   All questions were answered. The patient knows to call the clinic with any problems, questions or concerns. No barriers to learning was detected.  Michaelyn Barter, MD 7/2/20249:32 AM   HISTORY OF PRESENTING ILLNESS:  Derek Webb 72 y.o. 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  Patient was seen today accompanied by [name].  Denies any cough, hemoptysis, shortness of breath and weight loss. Remote smoker.  Quit in 2013. Has family history of lung cancer in father.  INTERVAL HISTORY-  Patient seen today prior to cycle 7 of Durvalumab.  Accompanied by sister. He is tolerating treatments well. Patient denies fever, chills, nausea, vomiting, shortness of breath, cough, abdominal pain, bleeding, bowel or bladder issues. Energy level is good.  Appetite is good.  Denies any weight loss. Denies pain.   I have reviewed his chart and materials related to his cancer extensively and collaborated history with the patient. Summary of oncologic history is as follows: Oncology History Overview Note  History of left upper lobe lung SCC status post microwave thermal ablation in 2013 by Dr. Fredia Sorrow.  He was deemed not a surgical candidate and had  transportation issues for radiation treatment.   Squamous cell lung cancer (HCC)  01/12/2022 Initial Diagnosis   Patient has been following with Dr. Welton Flakes of pulmonary for suspicious lung nodule. CT chest in March 2021 showed post ablation scarring in LUL not changed and increased atelectasis of RML and RUL. He did not follow through.   CT chest in 01/12/2022 showed  New abrupt and slightly irregular cut off of the right upper lobe bronchus with associated right upper lobe atelectasis and postobstructive changes. Findings are concerning for endobronchial malignancy    04/08/2022 PET scan   IMPRESSION: 1. Hypermetabolic central right upper lobe lung mass nearly occluding the right upper lobe bronchus, poorly delineated on the noncontrast CT images, measuring approximately 3.8 x 2.9 cm, compatible with primary bronchogenic malignancy. 2. Postobstructive pneumonia/atelectasis throughout the basilar right upper lobe. 3. No hypermetabolic metastatic thoracic adenopathy or distant metastatic disease. 4. Stable mild post ablation change in the left upper lobe with no metabolic evidence of local tumor recurrence in this location. 5. Dilated 4.4 cm ascending thoracic aorta. Recommend annual imaging followup by CTA or MRA   05/12/2022 Procedure   Procedure was delayed due to miscommunication about holding Plavix. S/p bronchoscopy with EBUS by Dr. Jayme Cloud. Op  note mentions significant bulky adenopathy in the subcarinal space and in the right hilum.    05/12/2022 Pathology Results   DIAGNOSIS:  A.  LUNG, RIGHT UPPER LOBE; ENB-ASSISTED BIOPSY:  - SQUAMOUS CELL CARCINOMA.   LUNG, RIGHT UPPER LOBE; EBUS-ASSISTED BRUSHING:  - POSITIVE FOR MALIGNANCY.  - SQUAMOUS CELL CARCINOMA IS PRESENT.   LUNG, RIGHT UPPER LOBE; EBUS-ASSISTED LAVAGE:  - POSITIVE FOR MALIGNANCY.  - SQUAMOUS CELL CARCINOMA IS PRESENT  SUBCARINAL SPACE, RIGHT; EBUS-ASSISTED FNA:  - POSITIVE FOR MALIGNANCY.  - SQUAMOUS CELL  CARCINOMA IS PRESENT   05/18/2022 Cancer Staging   Staging form: Lung, AJCC 7th Edition - Clinical: Stage IIIA (T4, N0, M0) - Signed by Michaelyn Barter, MD on 06/03/2022   05/21/2022 Imaging   MRI brain  No evidence of metastatic disease in the brain. 2. Small enhancing foci in the right parietal and right occipital calvarium, which are indeterminate but could represent small osseous metastases. Attention on follow-up   06/03/2022 - 07/27/2022 Chemotherapy   Patient is on Treatment Plan : LUNG Carboplatin + Paclitaxel + XRT q7d      08/03/2022 -  Chemotherapy   Patient is on Treatment Plan : LUNG NSCLC Durvalumab (1500) q28d        MEDICAL HISTORY:  Past Medical History:  Diagnosis Date   Alcoholism (HCC)    COPD (chronic obstructive pulmonary disease) (HCC)    SEVERE COPD -OCCASIONALLY USES OXYGEN AT NIGHT-DOES NOT USE INHALERS ON REGULAR BASIS   Elevated cholesterol    GERD (gastroesophageal reflux disease)    HOH (hard of hearing)    Hypertension    Lung cancer (HCC)    LEFT UPPER LUNG CARCINOMA-NOT A CANDIDATE FOR SURGICAL RESECTION BECAUSE OF HIS COPD AND POOR CANDIDATE FOR RADIATION BECAUSE OF TRANSPORTATION PROBLEMS   Seizures (HCC)    CHRONIC DILATIN - PT STATES NO SEIZURES IN PAST COUPLE OF YEARS; PT STATES HIS SEIZURES CAUSED NUMBNESS OF ARMS AND HANDS AND NOT ABLE TO MOVE HIS ARMS   Stroke (HCC)    2 TO 3 YRS AGO-AFFECTED HIS SPEECH--ABLE TO AMBULATE WITHOUT ASSIST AND DOES YARD WORK.  DECREASED HEARING IN BOTH EARS SINCE STROKE.   Vitamin D deficiency     SURGICAL HISTORY: Past Surgical History:  Procedure Laterality Date   CAROTID SURGERY 2009 -LEFT     COLON POLYP EXCISION     COLON SURGERY     COLONOSCOPY WITH PROPOFOL N/A 04/18/2017   Procedure: COLONOSCOPY WITH PROPOFOL;  Surgeon: Scot Jun, MD;  Location[facility] END[facility]COPY;  Service: Endoscopy;  Laterality: N/A;   ESOPHAGOGASTRODUODENOSCOPY     IR GENERIC HISTORICAL  01/21/2016   IR RADIOLOGIST  EVAL & MGMT 01/21/2016 Irish Lack, MD [facility] INTERV RAD   IR GENERIC HISTORICAL  10/22/2014   IR RADIOLOGIST EVAL & MGMT 10/22/2014 Irish Lack, MD [facility] INTERV RAD   IR IMAGING GUIDED PORT INSERTION  05/28/2022   IR RADIOLOGIST EVAL & MGMT  02/02/2017   IR RADIOLOGIST EVAL & MGMT  03/08/2018   percutaneous biopsy     VIDEO BRONCHOSCOPY WITH ENDOBRONCHIAL ULTRASOUND Right 05/12/2022   Procedure: VIDEO BRONCHOSCOPY WITH ENDOBRONCHIAL ULTRASOUND;  Surgeon: Salena Saner, MD;  Location: [facility] ORS;  Service: Cardiopulmonary;  Laterality: Right;    SOCIAL HISTORY: Social History   Socioeconomic History   Marital status: Single    Spouse name: Not on file   Number of children: Not on file   Years of education: Not on file   Highest education level: Not  on file  Occupational History   Not on file  Tobacco Use   Smoking status: Former    Packs/day: 1.00    Years: 35.00    Additional pack years: 0.00    Total pack years: 35.00    Types: Cigarettes    Quit date: 12/22/2011    Years since quitting: 11.1   Smokeless tobacco: Never  Vaping Use   Vaping Use: Never used  Substance and Sexual Activity   Alcohol use: Yes    Comment: occosionally    Drug use: No   Sexual activity: Not on file  Other Topics Concern   Not on file  Social History Narrative   Not on file   Social Determinants of Health   Financial Resource Strain: Low Risk  (11/28/2020)   Overall Financial Resource Strain (CARDIA)    Difficulty of Paying Living Expenses: Not hard at all  Food Insecurity: No Food Insecurity (06/15/2022)   Hunger Vital Sign    Worried About Running Out of Food in the Last Year: Never true    Ran Out of Food in the Last Year: Never true  Transportation Needs: No Transportation Needs (06/15/2022)   PRAPARE - Administrator, Civil Service (Medical): No    Lack of Transportation (Non-Medical): No  Physical Activity: Inactive (06/15/2022)   Exercise Vital Sign    Days of  Exercise per Week: 0 days    Minutes of Exercise per Session: 0 min  Stress: Stress Concern Present (06/15/2022)   [employer] of Occupational Health - Occupational Stress Questionnaire    Feeling of Stress : To some extent  Social Connections: Socially Isolated (06/15/2022)   Social Connection and Isolation Panel [NHANES]    Frequency of Communication with Friends and Family: More than three times a week    Frequency of Social Gatherings with Friends and Family: More than three times a week    Attends Religious Services: Never    Database administrator or Organizations: No    Attends Banker Meetings: Never    Marital Status: Never married  Intimate Partner Violence: Unknown (06/15/2022)   Humiliation, Afraid, Rape, and Kick questionnaire    Fear of Current or Ex-Partner: No    Emotionally Abused: Not on file    Physically Abused: No    Sexually Abused: No    FAMILY HISTORY: Family History  Problem Relation Age of Onset   Colon polyps Sister     ALLERGIES:  has No Known Allergies.  MEDICATIONS:  Current Outpatient Medications  Medication Sig Dispense Refill   aspirin 81 MG tablet Take 81 mg by mouth daily.     atorvastatin (LIPITOR) 80 MG tablet Take 1 tablet (80 mg total) by mouth daily. 90 tablet 3   budesonide-formoterol (SYMBICORT) 160-4.5 MCG/ACT inhaler Inhale 1 puff into the lungs 2 (two) times daily. 1 each 5   Cholecalciferol (VITAMIN D) 2000 UNITS CAPS Take by mouth. VITAMIN D 3 2000 IU  ONE SOFTGEL DAILY  --OTC     clopidogrel (PLAVIX) 75 MG tablet Take 1 tablet (75 mg total) by mouth daily. 90 tablet 3   enalapril (VASOTEC) 20 MG tablet Take 1 tablet (20 mg total) by mouth daily. 90 tablet 3   ezetimibe (ZETIA) 10 MG tablet Take 1 tablet (10 mg total) by mouth daily. 90 tablet 3   Na Sulfate-K Sulfate-Mg Sulf 17.5-3.13-1.6 GM/177ML SOLN Take by mouth.     OXYGEN Inhale into the lungs. At night  pantoprazole (PROTONIX) 40 MG tablet Take 1  tablet (40 mg total) by mouth daily. IN AM 90 tablet 0   phenytoin (DILANTIN) 100 MG ER capsule Take 4 capsules (400 mg total) by mouth at bedtime. 360 capsule 0   No current facility-administered medications for this visit.   Facility-Administered Medications Ordered in Other Visits  Medication Dose Route Frequency Provider Last Rate Last Admin   0.9 %  sodium chloride infusion   Intravenous Once Michaelyn Barter, MD       durvalumab (IMFINZI) 1,500 mg in sodium chloride 0.9 % 100 mL chemo infusion  1,500 mg Intravenous Once Michaelyn Barter, MD       heparin lock flush 100 unit/mL  500 Units Intracatheter Once PRN Michaelyn Barter, MD       sodium chloride flush (NS) 0.9 % injection 10 mL  10 mL Intracatheter PRN Michaelyn Barter, MD        REVIEW OF SYSTEMS:   Pertinent information mentioned in HPI All other systems were reviewed with the patient and are negative.  PHYSICAL EXAMINATION: ECOG PERFORMANCE STATUS: 2 - Symptomatic, <50% confined to bed  Vitals:   02/22/23 0851  BP: 110/60  Pulse: 77  Temp: 98.3 F (36.8 C)  SpO2: 98%     Filed Weights   02/22/23 0851  Weight: 184 lb 11.2 oz (83.8 kg)       GENERAL:alert, no distress and comfortable SKIN: skin color, texture, turgor are normal, no rashes or significant lesions EYES: normal, conjunctiva are pink and non-injected, sclera clear OROPHARYNX:no exudate, no erythema and lips, buccal mucosa, and tongue normal  NECK: supple, thyroid normal size, non-tender, without nodularity LYMPH:  no palpable lymphadenopathy in the cervical, axillary or inguinal LUNGS: clear to auscultation and percussion with normal breathing effort HEART: regular rate & rhythm and no murmurs and no lower extremity edema ABDOMEN:abdomen soft, non-tender and normal bowel sounds Musculoskeletal:no cyanosis of digits and no clubbing  PSYCH: alert & oriented x 3 with fluent speech NEURO: no focal motor/sensory deficits  LABORATORY DATA:  I have  reviewed the data as listed Lab Results  Component Value Date   WBC 3.5 (L) 02/22/2023   HGB 12.4 (L) 02/22/2023   HCT 36.2 (L) 02/22/2023   MCV 87.2 02/22/2023   PLT 413 (H) 02/22/2023   Recent Labs    12/31/22 0916 01/25/23 0903 02/22/23 0831  NA 136 136 135  K 3.9 4.4 3.8  CL 101 104 102  CO2 25 27 25   GLUCOSE 88 103* 101*  BUN 12 12 12   CREATININE 0.45* 0.63 0.65  CALCIUM 9.1 8.9 9.2  GFRNONAA >60 >60 >60  PROT 7.7 7.6 7.9  ALBUMIN 3.6 3.5 3.7  AST 19 19 18   ALT 19 18 19   ALKPHOS 108 115 109  BILITOT 0.6 0.3 0.3    RADIOGRAPHIC STUDIES: I have personally reviewed the radiological images as listed and agreed with the findings in the report. MR Brain W Wo Contrast  Result Date: 02/14/2023 CLINICAL DATA:  Non-small cell lung cancer. EXAM: MRI HEAD WITHOUT AND WITH CONTRAST TECHNIQUE: Multiplanar, multiecho pulse sequences of the brain and surrounding structures were obtained without and with intravenous contrast. CONTRAST:  8mL GADAVIST GADOBUTROL 1 MMOL/ML IV SOLN COMPARISON:  Head MRI 05/22/2022 FINDINGS: The study is intermittently motion degraded, including moderate to severe motion on the coronal T1 postcontrast sequence. The other postcontrast sequences are of adequate quality. Brain: There is no evidence of an acute infarct, mass, midline shift, or extra-axial  fluid collection. A small chronic left frontal infarct and moderate-sized chronic infarcts at the junctions of the temporal, parietal, and occipital lobes bilaterally are again noted. There are also chronic lacunar infarcts in the left greater than right basal ganglia and deep white matter with associated chronic blood products. There is mild global cerebral atrophy. No abnormal enhancement is identified. Vascular: Major intracranial vascular flow voids are preserved. Skull and upper cervical spine: Unchanged nonspecific subcentimeter enhancing focus in the right parietal calvarium (series 18, image 85).  Sinuses/Orbits: Unremarkable orbits. Mucous retention cyst in the left maxillary sinus. Trace right mastoid fluid. Other: None. IMPRESSION: 1. No evidence of intracranial metastases. 2. Multiple chronic infarcts as above. Electronically Signed   By: Sebastian Ache M.D.   On: 02/14/2023 11:56   NM PET Image Restag (PS) Skull Base To Thigh  Result Date: 02/13/2023 CLINICAL DATA:  Subsequent treatment strategy for squamous cell right lung cancer. EXAM: NUCLEAR MEDICINE PET SKULL BASE TO THIGH TECHNIQUE: 10.3 mCi F-18 FDG was injected intravenously. Full-ring PET imaging was performed from the skull base to thigh after the radiotracer. CT data was obtained and used for attenuation correction and anatomic localization. Fasting blood glucose: 78 mg/dl COMPARISON:  CT chest dated 01/19/2023.  PET-CT dated 04/08/2022. FINDINGS: Mediastinal blood pool activity: SUV max 1.8 Liver activity: SUV max NA NECK: No hypermetabolic cervical lymphadenopathy. Incidental CT findings: None. CHEST: Posterior right upper lobe patchy opacity with volume loss, favoring radiation changes. Specifically, no focal hypermetabolism along the right upper lobe bronchus to suggest residual/recurrent obstructing tumor. Additional patchy opacity in the medial left upper lobe, also favoring radiation changes. No hypermetabolic pulmonary nodules. Small bilateral axillary nodes, including an 8 mm short axis left axillary node (series 4/image 50) with max SUV 1.8, likely reactive. No suspicious thoracic lymphadenopathy. Right chest port terminates at the cavoatrial junction. Incidental CT findings: Volume loss in the right hemithorax with rightward cardiomediastinal shift. Atherosclerotic calcifications of the aortic arch. Mild coronary atherosclerosis of the LAD. Trace right pleural effusion. ABDOMEN/PELVIS: No abnormal hypermetabolism in the liver, spleen, pancreas, or adrenal glands. Small bilateral inguinal nodes, including a 9 mm short axis right  inguinal node with preservation of the normal fatty hilum (series 4/image 155), max SUV 2.4, likely reactive. No suspicious abdominopelvic lymphadenopathy. Focal hypermetabolism in the left peripheral zone of the prostate, max SUV 13.4. Incidental CT findings: Coarse calcifications in the portacaval region. Atherosclerotic calcifications the abdominal aorta and branch vessels. Bladder is decompressed. SKELETON: No focal hypermetabolic activity to suggest skeletal metastasis. Incidental CT findings: Degenerative changes of the visualized thoracolumbar spine. IMPRESSION: Radiation changes in the right upper lobe and medial left upper lobe. No findings suspicious for residual/recurrent primary bronchogenic carcinoma. No findings suspicious for metastatic disease. Focal left prostate hypermetabolism, raising the possibility of prostate carcinoma. Correlate with PSA and consider urologic consultation as clinically warranted. Electronically Signed   By: Charline Bills M.D.   On: 02/13/2023 00:06

## 2023-02-22 NOTE — Patient Instructions (Signed)
[facility]  Discharge Instructions: Thank you for choosing [facility] to provide your oncology and hematology care.  If you have a lab appointment with the Cancer Center, please go directly to the Cancer Center and check in at the registration area.  Wear comfortable clothing and clothing appropriate for easy access to any Portacath or PICC line.   We strive to give you quality time with your provider. You may need to reschedule your appointment if you arrive late (15 or more minutes).  Arriving late affects you and other patients whose appointments are after yours.  Also, if you miss three or more appointments without notifying the office, you may be dismissed from the clinic at the provider's discretion.      For prescription refill requests, have your pharmacy contact our office and allow 72 hours for refills to be completed.    Today you received the following chemotherapy and/or immunotherapy agents Imfinzi        To help prevent nausea and vomiting after your treatment, we encourage you to take your nausea medication as directed.  BELOW ARE SYMPTOMS THAT SHOULD BE REPORTED IMMEDIATELY: *FEVER GREATER THAN 100.4 F (38 C) OR HIGHER *CHILLS OR SWEATING *NAUSEA AND VOMITING THAT IS NOT CONTROLLED WITH YOUR NAUSEA MEDICATION *UNUSUAL SHORTNESS OF BREATH *UNUSUAL BRUISING OR BLEEDING *URINARY PROBLEMS (pain or burning when urinating, or frequent urination) *BOWEL PROBLEMS (unusual diarrhea, constipation, pain near the anus) TENDERNESS IN MOUTH AND THROAT WITH OR WITHOUT PRESENCE OF ULCERS (sore throat, sores in mouth, or a toothache) UNUSUAL RASH, SWELLING OR PAIN  UNUSUAL VAGINAL DISCHARGE OR ITCHING   Items with * indicate a potential emergency and should be followed up as soon as possible or go to the Emergency Department if any problems should occur.  Please show the CHEMOTHERAPY ALERT CARD or IMMUNOTHERAPY ALERT CARD at check-in to  the Emergency Department and triage nurse.  Should you have questions after your visit or need to cancel or reschedule your appointment, please contact [facility]  [phone] and follow the prompts.  Office hours are 8:00 a.m. to 4:30 p.m. Monday - Friday. Please note that voicemails left after 4:00 p.m. may not be returned until the following business day.  We are closed weekends and major holidays. You have access to a nurse at all times for urgent questions. Please call the main number to the clinic [phone] and follow the prompts.  For any non-urgent questions, you may also contact your provider using MyChart. We now offer e-Visits for anyone 18 and older to request care online for non-urgent symptoms. For details visit [email or web address].   Also download the MyChart app! Go to the app store, search "MyChart", open the app, select [place], and log in with your MyChart username and password.    

## 2023-02-23 ENCOUNTER — Encounter: Payer: Self-pay | Admitting: Internal Medicine

## 2023-02-28 ENCOUNTER — Inpatient Hospital Stay: Payer: 59

## 2023-02-28 NOTE — Progress Notes (Signed)
[facility] CSW Progress Note  Visual merchandiser spoke with patient's sister, [name], via phone.  She wanted to change the appointment for patient's advance directives meeting this Thursday.  Patient has another MD appointment that morning, so the meeting with the CSW was changed to 10am.    Dorothey Baseman, LCSW Clinical Social Worker [facility]    Patient is participating in a Managed Medicaid Plan:  Yes

## 2023-03-03 ENCOUNTER — Inpatient Hospital Stay: Payer: 59

## 2023-03-03 ENCOUNTER — Encounter: Payer: Self-pay | Admitting: Physician Assistant

## 2023-03-03 ENCOUNTER — Ambulatory Visit (INDEPENDENT_AMBULATORY_CARE_PROVIDER_SITE_OTHER): Payer: 59 | Admitting: Physician Assistant

## 2023-03-03 VITALS — BP 110/70 | HR 65 | Temp 97.8°F | Resp 16 | Ht 67.0 in | Wt 183.0 lb

## 2023-03-03 DIAGNOSIS — E785 Hyperlipidemia, unspecified: Secondary | ICD-10-CM | POA: Diagnosis not present

## 2023-03-03 DIAGNOSIS — I10 Essential (primary) hypertension: Secondary | ICD-10-CM

## 2023-03-03 DIAGNOSIS — K219 Gastro-esophageal reflux disease without esophagitis: Secondary | ICD-10-CM

## 2023-03-03 DIAGNOSIS — Z5181 Encounter for therapeutic drug level monitoring: Secondary | ICD-10-CM | POA: Diagnosis not present

## 2023-03-03 DIAGNOSIS — E782 Mixed hyperlipidemia: Secondary | ICD-10-CM

## 2023-03-03 DIAGNOSIS — C3492 Malignant neoplasm of unspecified part of left bronchus or lung: Secondary | ICD-10-CM | POA: Diagnosis not present

## 2023-03-03 DIAGNOSIS — Z8669 Personal history of other diseases of the nervous system and sense organs: Secondary | ICD-10-CM

## 2023-03-03 DIAGNOSIS — I700 Atherosclerosis of aorta: Secondary | ICD-10-CM | POA: Diagnosis not present

## 2023-03-03 MED ORDER — ATORVASTATIN CALCIUM 80 MG PO TABS
80.0000 mg | ORAL_TABLET | Freq: Every day | ORAL | 3 refills | Status: DC
Start: 2023-03-03 — End: 2023-12-21

## 2023-03-03 MED ORDER — CLOPIDOGREL BISULFATE 75 MG PO TABS
75.0000 mg | ORAL_TABLET | Freq: Every day | ORAL | 3 refills | Status: DC
Start: 2023-03-03 — End: 2023-12-21

## 2023-03-03 MED ORDER — PANTOPRAZOLE SODIUM 40 MG PO TBEC
40.0000 mg | DELAYED_RELEASE_TABLET | Freq: Every day | ORAL | 2 refills | Status: DC
Start: 2023-03-03 — End: 2023-04-28

## 2023-03-03 MED ORDER — EZETIMIBE 10 MG PO TABS
10.0000 mg | ORAL_TABLET | Freq: Every day | ORAL | 3 refills | Status: DC
Start: 2023-03-03 — End: 2023-12-21

## 2023-03-03 MED ORDER — ENALAPRIL MALEATE 20 MG PO TABS
20.0000 mg | ORAL_TABLET | Freq: Every day | ORAL | 3 refills | Status: DC
Start: 2023-03-03 — End: 2023-05-18

## 2023-03-03 MED ORDER — PHENYTOIN SODIUM EXTENDED 100 MG PO CAPS
400.0000 mg | ORAL_CAPSULE | Freq: Every day | ORAL | 0 refills | Status: DC
Start: 2023-03-03 — End: 2023-05-18

## 2023-03-03 NOTE — Progress Notes (Signed)
[facility] Advance Directives Clinical Social Work  Patient presented to Advance Directives Clinic  to review and complete healthcare advance directives.  Clinical Social Worker met with patient and his sister, Derek Webb.  The patient designated Derek Webb as their primary healthcare agent and Derek Webb as their secondary agent.  Patient also completed healthcare living will.    Documents were notarized and copies made for patient/family. Clinical Social Worker will send documents to medical records to be scanned into patient's chart. Clinical Social Worker encouraged patient/family to contact with any additional questions or concerns.   Dorothey Baseman, LCSW Clinical Social Worker [facility]        Patient is participating in a Managed Medicaid Plan:  Yes

## 2023-03-03 NOTE — Progress Notes (Signed)
[facility] [address] [place], [place] [address]  Internal MEDICINE  Office Visit Note  Patient Name: Derek Webb  [id number]  [id number]  Date of Service: 03/09/2023  Chief Complaint  Patient presents with   Follow-up   Hypertension   Hyperlipidemia    HPI Pt is here for routine follow up -Breathing has been stable -did see oncology and has been referred to urology.  -Oncology is monitoring most labs, however his dilantin level needs to be checked as well as lipids and will go ahead and order these -he is taking his medications as prescribed and reports not problems with these -BP stable  Current Medication: Outpatient Encounter Medications as of 03/03/2023  Medication Sig   aspirin 81 MG tablet Take 81 mg by mouth daily.   budesonide-formoterol (SYMBICORT) 160-4.5 MCG/ACT inhaler Inhale 1 puff into the lungs 2 (two) times daily.   Cholecalciferol (VITAMIN D) 2000 UNITS CAPS Take by mouth. VITAMIN D 3 2000 IU  ONE SOFTGEL DAILY  --OTC   Na Sulfate-K Sulfate-Mg Sulf 17.5-3.13-1.6 GM/177ML SOLN Take by mouth.   OXYGEN Inhale into the lungs. At night   [DISCONTINUED] atorvastatin (LIPITOR) 80 MG tablet Take 1 tablet (80 mg total) by mouth daily.   [DISCONTINUED] clopidogrel (PLAVIX) 75 MG tablet Take 1 tablet (75 mg total) by mouth daily.   [DISCONTINUED] enalapril (VASOTEC) 20 MG tablet Take 1 tablet (20 mg total) by mouth daily.   [DISCONTINUED] ezetimibe (ZETIA) 10 MG tablet Take 1 tablet (10 mg total) by mouth daily.   [DISCONTINUED] pantoprazole (PROTONIX) 40 MG tablet Take 1 tablet (40 mg total) by mouth daily. IN AM   [DISCONTINUED] phenytoin (DILANTIN) 100 MG ER capsule Take 4 capsules (400 mg total) by mouth at bedtime.   atorvastatin (LIPITOR) 80 MG tablet Take 1 tablet (80 mg total) by mouth daily.   clopidogrel (PLAVIX) 75 MG tablet Take 1 tablet (75 mg total) by mouth daily.   enalapril (VASOTEC) 20 MG tablet Take 1 tablet (20 mg total) by mouth  daily.   ezetimibe (ZETIA) 10 MG tablet Take 1 tablet (10 mg total) by mouth daily.   pantoprazole (PROTONIX) 40 MG tablet Take 1 tablet (40 mg total) by mouth daily. IN AM   phenytoin (DILANTIN) 100 MG ER capsule Take 4 capsules (400 mg total) by mouth at bedtime.   No facility-administered encounter medications on file as of 03/03/2023.    Surgical History: Past Surgical History:  Procedure Laterality Date   CAROTID SURGERY 2009 -LEFT     COLON POLYP EXCISION     COLON SURGERY     COLONOSCOPY WITH PROPOFOL N/A 04/18/2017   Procedure: COLONOSCOPY WITH PROPOFOL;  Surgeon: Scot Jun, MD;  Location[facility] ENDOSCOPY;  Service: Endoscopy;  Laterality: N/A;   ESOPHAGOGASTRODUODENOSCOPY     IR GENERIC HISTORICAL  01/21/2016   IR RADIOLOGIST EVAL & MGMT 01/21/2016 Irish Lack, MD [facility] INTERV RAD   IR GENERIC HISTORICAL  10/22/2014   IR RADIOLOGIST EVAL & MGMT 10/22/2014 Irish Lack, MD [facility] INTERV RAD   IR IMAGING GUIDED PORT INSERTION  05/28/2022   IR RADIOLOGIST EVAL & MGMT  02/02/2017   IR RADIOLOGIST EVAL & MGMT  03/08/2018   percutaneous biopsy     VIDEO BRONCHOSCOPY WITH ENDOBRONCHIAL ULTRASOUND Right 05/12/2022   Procedure: VIDEO BRONCHOSCOPY WITH ENDOBRONCHIAL ULTRASOUND;  Surgeon: Salena Saner, MD;  Location: [facility] ORS;  Service: Cardiopulmonary;  Laterality: Right;    Medical History: Past Medical History:  Diagnosis Date  Alcoholism (HCC)    COPD (chronic obstructive pulmonary disease) (HCC)    SEVERE COPD -OCCASIONALLY USES OXYGEN AT NIGHT-DOES NOT USE INHALERS ON REGULAR BASIS   Elevated cholesterol    GERD (gastroesophageal reflux disease)    HOH (hard of hearing)    Hypertension    Lung cancer (HCC)    LEFT UPPER LUNG CARCINOMA-NOT A CANDIDATE FOR SURGICAL RESECTION BECAUSE OF HIS COPD AND POOR CANDIDATE FOR RADIATION BECAUSE OF TRANSPORTATION PROBLEMS   Seizures (HCC)    CHRONIC DILATIN - PT STATES NO SEIZURES IN PAST COUPLE OF YEARS; PT STATES HIS  SEIZURES CAUSED NUMBNESS OF ARMS AND HANDS AND NOT ABLE TO MOVE HIS ARMS   Stroke (HCC)    2 TO 3 YRS AGO-AFFECTED HIS SPEECH--ABLE TO AMBULATE WITHOUT ASSIST AND DOES YARD WORK.  DECREASED HEARING IN BOTH EARS SINCE STROKE.   Vitamin D deficiency     Family History: Family History  Problem Relation Age of Onset   Colon polyps Sister     Social History   Socioeconomic History   Marital status: Single    Spouse name: Not on file   Number of children: Not on file   Years of education: Not on file   Highest education level: Not on file  Occupational History   Not on file  Tobacco Use   Smoking status: Former    Current packs/day: 0.00    Average packs/day: 1 pack/day for 35.0 years (35.0 ttl pk-yrs)    Types: Cigarettes    Start date: 12/21/1976    Quit date: 12/22/2011    Years since quitting: 11.2   Smokeless tobacco: Never  Vaping Use   Vaping status: Never Used  Substance and Sexual Activity   Alcohol use: Yes    Comment: occosionally    Drug use: No   Sexual activity: Not on file  Other Topics Concern   Not on file  Social History Narrative   Not on file   Social Determinants of Health   Financial Resource Strain: Low Risk  (11/28/2020)   Overall Financial Resource Strain (CARDIA)    Difficulty of Paying Living Expenses: Not hard at all  Food Insecurity: No Food Insecurity (06/15/2022)   Hunger Vital Sign    Worried About Running Out of Food in the Last Year: Never true    Ran Out of Food in the Last Year: Never true  Transportation Needs: No Transportation Needs (06/15/2022)   PRAPARE - Administrator, Civil Service (Medical): No    Lack of Transportation (Non-Medical): No  Physical Activity: Inactive (06/15/2022)   Exercise Vital Sign    Days of Exercise per Week: 0 days    Minutes of Exercise per Session: 0 min  Stress: Stress Concern Present (06/15/2022)   [employer] of Occupational Health - Occupational Stress Questionnaire    Feeling  of Stress : To some extent  Social Connections: Socially Isolated (06/15/2022)   Social Connection and Isolation Panel [NHANES]    Frequency of Communication with Friends and Family: More than three times a week    Frequency of Social Gatherings with Friends and Family: More than three times a week    Attends Religious Services: Never    Database administrator or Organizations: No    Attends Banker Meetings: Never    Marital Status: Never married  Intimate Partner Violence: Unknown (06/15/2022)   Humiliation, Afraid, Rape, and Kick questionnaire    Fear of Current or Ex-Partner: No  Emotionally Abused: Not on file    Physically Abused: No    Sexually Abused: No      Review of Systems  Constitutional:  Negative for chills, fatigue and unexpected weight change.  HENT:  Negative for congestion, postnasal drip, rhinorrhea, sneezing and sore throat.   Eyes:  Negative for redness.  Respiratory:  Negative for cough, chest tightness and shortness of breath.   Cardiovascular:  Negative for chest pain and palpitations.  Gastrointestinal:  Negative for abdominal pain, constipation, diarrhea, nausea and vomiting.  Genitourinary:  Negative for dysuria and frequency.  Musculoskeletal:  Negative for arthralgias, back pain, joint swelling and neck pain.  Skin:  Negative for rash.  Neurological: Negative.  Negative for tremors and numbness.  Hematological:  Negative for adenopathy. Does not bruise/bleed easily.  Psychiatric/Behavioral:  Negative for behavioral problems (Depression), sleep disturbance and suicidal ideas. The patient is not nervous/anxious.     Vital Signs: BP 110/70   Pulse 65   Temp 97.8 F (36.6 C)   Resp 16   Ht 5\' 7"  (1.702 m)   Wt 183 lb (83 kg)   SpO2 97%   BMI 28.66 kg/m    Physical Exam Vitals and nursing note reviewed.  Constitutional:      Appearance: Normal appearance.  HENT:     Head: Normocephalic and atraumatic.  Eyes:     Pupils:  Pupils are equal, round, and reactive to light.  Cardiovascular:     Rate and Rhythm: Normal rate and regular rhythm.  Pulmonary:     Effort: Pulmonary effort is normal.     Breath sounds: Normal breath sounds.  Musculoskeletal:        General: Normal range of motion.  Skin:    General: Skin is warm and dry.  Neurological:     Mental Status: He is alert.  Psychiatric:        Mood and Affect: Mood normal.        Behavior: Behavior normal.        Assessment/Plan: 1. Essential hypertension Stable, continue current medication - enalapril (VASOTEC) 20 MG tablet; Take 1 tablet (20 mg total) by mouth daily.  Dispense: 90 tablet; Refill: 3  2. History of seizure disorder - phenytoin (DILANTIN) 100 MG ER capsule; Take 4 capsules (400 mg total) by mouth at bedtime.  Dispense: 360 capsule; Refill: 0  3. Squamous cell carcinoma of left lung (HCC) Followed by oncology  4. Encounter for therapeutic drug level monitoring - Dilantin (Phenytoin) level, total  5. Hyperlipidemia, unspecified hyperlipidemia type - atorvastatin (LIPITOR) 80 MG tablet; Take 1 tablet (80 mg total) by mouth daily.  Dispense: 90 tablet; Refill: 3 - ezetimibe (ZETIA) 10 MG tablet; Take 1 tablet (10 mg total) by mouth daily.  Dispense: 90 tablet; Refill: 3  6. Atherosclerosis of aorta (HCC) - clopidogrel (PLAVIX) 75 MG tablet; Take 1 tablet (75 mg total) by mouth daily.  Dispense: 90 tablet; Refill: 3  7. Gastroesophageal reflux disease without esophagitis - pantoprazole (PROTONIX) 40 MG tablet; Take 1 tablet (40 mg total) by mouth daily. IN AM  Dispense: 90 tablet; Refill: 2   General Counseling: Dallen verbalizes understanding of the findings of todays visit and agrees with plan of treatment. I have discussed any further diagnostic evaluation that may be needed or ordered today. We also reviewed his medications today. he has been encouraged to call the office with any questions or concerns that should arise  related to todays visit.    Orders Placed  This Encounter  Procedures   Dilantin (Phenytoin) level, total   Lipid Panel With LDL/HDL Ratio    Meds ordered this encounter  Medications   atorvastatin (LIPITOR) 80 MG tablet    Sig: Take 1 tablet (80 mg total) by mouth daily.    Dispense:  90 tablet    Refill:  3   clopidogrel (PLAVIX) 75 MG tablet    Sig: Take 1 tablet (75 mg total) by mouth daily.    Dispense:  90 tablet    Refill:  3   enalapril (VASOTEC) 20 MG tablet    Sig: Take 1 tablet (20 mg total) by mouth daily.    Dispense:  90 tablet    Refill:  3   ezetimibe (ZETIA) 10 MG tablet    Sig: Take 1 tablet (10 mg total) by mouth daily.    Dispense:  90 tablet    Refill:  3   pantoprazole (PROTONIX) 40 MG tablet    Sig: Take 1 tablet (40 mg total) by mouth daily. IN AM    Dispense:  90 tablet    Refill:  2   phenytoin (DILANTIN) 100 MG ER capsule    Sig: Take 4 capsules (400 mg total) by mouth at bedtime.    Dispense:  360 capsule    Refill:  0    This patient was seen by Lynn Ito, PA-C in collaboration with Dr. Beverely Risen as a part of collaborative care agreement.   Total time spent:30 Minutes Time spent includes review of chart, medications, test results, and follow up plan with the patient.      Dr Lyndon Code Internal medicine

## 2023-03-04 ENCOUNTER — Encounter: Payer: Self-pay | Admitting: Internal Medicine

## 2023-03-16 DIAGNOSIS — Z5181 Encounter for therapeutic drug level monitoring: Secondary | ICD-10-CM | POA: Diagnosis not present

## 2023-03-16 DIAGNOSIS — E782 Mixed hyperlipidemia: Secondary | ICD-10-CM | POA: Diagnosis not present

## 2023-03-17 LAB — LIPID PANEL WITH LDL/HDL RATIO
Cholesterol, Total: 171 mg/dL (ref 100–199)
HDL: 71 mg/dL (ref >39)
LDL Chol Calc (NIH): 90 mg/dL (ref 0–99)
LDL/HDL Ratio: 1.3 ratio (ref 0.0–3.6)
Triglycerides: 49 mg/dL (ref 0–149)

## 2023-03-17 LAB — PHENYTOIN LEVEL, TOTAL: Phenytoin (Dilantin), Serum: 6.8 ug/mL — ABNORMAL LOW (ref 10.0–20.0)

## 2023-03-22 ENCOUNTER — Inpatient Hospital Stay: Payer: 59

## 2023-03-22 ENCOUNTER — Inpatient Hospital Stay: Payer: 59 | Admitting: Internal Medicine

## 2023-03-22 VITALS — BP 108/78 | HR 76 | Temp 97.4°F | Wt 186.0 lb

## 2023-03-22 DIAGNOSIS — Z79620 Long term (current) use of immunosuppressive biologic: Secondary | ICD-10-CM | POA: Diagnosis not present

## 2023-03-22 DIAGNOSIS — D72819 Decreased white blood cell count, unspecified: Secondary | ICD-10-CM | POA: Diagnosis not present

## 2023-03-22 DIAGNOSIS — C3491 Malignant neoplasm of unspecified part of right bronchus or lung: Secondary | ICD-10-CM

## 2023-03-22 DIAGNOSIS — I10 Essential (primary) hypertension: Secondary | ICD-10-CM | POA: Diagnosis not present

## 2023-03-22 DIAGNOSIS — Z87891 Personal history of nicotine dependence: Secondary | ICD-10-CM | POA: Diagnosis not present

## 2023-03-22 DIAGNOSIS — C3411 Malignant neoplasm of upper lobe, right bronchus or lung: Secondary | ICD-10-CM | POA: Diagnosis not present

## 2023-03-22 DIAGNOSIS — J440 Chronic obstructive pulmonary disease with acute lower respiratory infection: Secondary | ICD-10-CM | POA: Diagnosis not present

## 2023-03-22 DIAGNOSIS — E785 Hyperlipidemia, unspecified: Secondary | ICD-10-CM | POA: Diagnosis not present

## 2023-03-22 DIAGNOSIS — Z5112 Encounter for antineoplastic immunotherapy: Secondary | ICD-10-CM

## 2023-03-22 DIAGNOSIS — D649 Anemia, unspecified: Secondary | ICD-10-CM | POA: Diagnosis not present

## 2023-03-22 LAB — COMPREHENSIVE METABOLIC PANEL
ALT: 16 U/L (ref 0–44)
AST: 17 U/L (ref 15–41)
Albumin: 3.4 g/dL — ABNORMAL LOW (ref 3.5–5.0)
Alkaline Phosphatase: 89 U/L (ref 38–126)
Anion gap: 8 (ref 5–15)
BUN: 11 mg/dL (ref 8–23)
CO2: 22 mmol/L (ref 22–32)
Calcium: 8.7 mg/dL — ABNORMAL LOW (ref 8.9–10.3)
Chloride: 103 mmol/L (ref 98–111)
Creatinine, Ser: 0.58 mg/dL — ABNORMAL LOW (ref 0.61–1.24)
GFR, Estimated: >60 mL/min (ref >60)
Glucose, Bld: 88 mg/dL (ref 70–99)
Potassium: 3.9 mmol/L (ref 3.5–5.1)
Sodium: 133 mmol/L — ABNORMAL LOW (ref 135–145)
Total Bilirubin: 0.4 mg/dL (ref 0.3–1.2)
Total Protein: 7 g/dL (ref 6.5–8.1)

## 2023-03-22 LAB — CBC WITH DIFFERENTIAL/PLATELET
Abs Immature Granulocytes: 0.02 10*3/uL (ref 0.00–0.07)
Basophils Absolute: 0.1 10*3/uL (ref 0.0–0.1)
Basophils Relative: 2 %
Eosinophils Absolute: 0.2 10*3/uL (ref 0.0–0.5)
Eosinophils Relative: 4 %
HCT: 35.2 % — ABNORMAL LOW (ref 39.0–52.0)
Hemoglobin: 11.8 g/dL — ABNORMAL LOW (ref 13.0–17.0)
Immature Granulocytes: 1 %
Lymphocytes Relative: 31 %
Lymphs Abs: 1.2 10*3/uL (ref 0.7–4.0)
MCH: 30 pg (ref 26.0–34.0)
MCHC: 33.5 g/dL (ref 30.0–36.0)
MCV: 89.6 fL (ref 80.0–100.0)
Monocytes Absolute: 0.5 10*3/uL (ref 0.1–1.0)
Monocytes Relative: 14 %
Neutro Abs: 1.9 10*3/uL (ref 1.7–7.7)
Neutrophils Relative %: 48 %
Platelets: 449 10*3/uL — ABNORMAL HIGH (ref 150–400)
RBC: 3.93 MIL/uL — ABNORMAL LOW (ref 4.22–5.81)
RDW: 14.6 % (ref 11.5–15.5)
WBC: 3.8 10*3/uL — ABNORMAL LOW (ref 4.0–10.5)
nRBC: 0 % (ref 0.0–0.2)

## 2023-03-22 MED ORDER — HEPARIN SOD (PORK) LOCK FLUSH 100 UNIT/ML IV SOLN
500.0000 [IU] | Freq: Once | INTRAVENOUS | Status: DC | PRN
  Filled 2023-03-22: qty 5

## 2023-03-22 MED ORDER — SODIUM CHLORIDE 0.9% FLUSH
10.0000 mL | Freq: Once | INTRAVENOUS | Status: AC
  Administered 2023-03-22: 10 mL via INTRAVENOUS
  Filled 2023-03-22: qty 10

## 2023-03-22 MED ORDER — SODIUM CHLORIDE 0.9 % IV SOLN
Freq: Once | INTRAVENOUS | Status: AC
  Filled 2023-03-22: qty 250

## 2023-03-22 MED ORDER — SODIUM CHLORIDE 0.9 % IV SOLN
1500.0000 mg | Freq: Once | INTRAVENOUS | Status: AC
  Administered 2023-03-22: 1500 mg via INTRAVENOUS
  Filled 2023-03-22: qty 30

## 2023-03-22 NOTE — Progress Notes (Signed)
Patient is doing well, he said that he does need a refill on a medication but is unsure of the type.

## 2023-03-22 NOTE — Progress Notes (Signed)
[facility] CONSULT NOTE  Patient Care Team: [name], [name] as PCP - General (Physician Assistant) [name], [facility] (Inactive) as Pharmacist (Pharmacist) [name], RN as Oncology Nurse Navigator Michaelyn Barter, MD as Consulting Physician (Oncology) Yevonne Pax, MD as Consulting Physician (Internal Medicine)   CANCER STAGING   Cancer Staging  Squamous cell lung cancer [facility]) Staging form: Lung, AJCC 7th Edition - Clinical: Stage IIIA (T4, N0, M0) - Signed by Michaelyn Barter, MD on 06/03/2022  CURRENT TREATMENT- Weekly Carboplatin and Taxol with RT completed on 07/27/2022.  Maintenance Durvalumab started 08/13/2022. Plan total 12 cycles.   ASSESSMENT & PLAN:  Derek Webb 72 y.o. 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  #RUL of lung SCCa, atleast Stage IIIA (cT4N0M0) #Encounter for antineoplastic immunotherapy -s/p bronchoscopy with EBUS by Dr. Jayme Cloud on 05/12/2022.  -Completed 5 cycles of CarboTaxol and RT on 07/27/2022. Cycle 6 was canceled due to persistent neutropenia.  Repeat CT chest done on 1/15 showed near complete resolution of right upper lobe mass, persistent.  Obstructive atelectasis seen.  No adenopathy.  Patient had excellent response to the treatment.    - CT chest with contrast (11/30/2022) was reviewed at the tumor conference.  There is interval development of marked interstitial disease and patchy airspace disease more confluent consolidative opacity in the peripheral right upper lobe progressive and interval.  In the lower lung there is ill-defined tree-in-bud nodularity with nodular areas measuring up to 1.4 cm in the posterior left lower lobe.  Features are suggestive of infectious and inflammatory etiology however disease progression  cannot be excluded.  Liver lesion is stable and likely hemangioma.  Patient continues to be asymptomatic.  Cycle 6 of Durvalumab was held.    - Repeat CT chest with contrast (01/19/2023) showed interval improvement in the diffuse interstitial lung disease seen on prior study.  But there is still some persistent interstitial patchy area of consolidative airspace in the left upper lobe.  Ill-defined nodularity in the lower lobe has largely resolved although some component of dominant nodularity persist.  There is continued progression of volume loss in the right hemithorax with consolidative opacity in the right posterior apex and suprahilar right lung.  Right upper lobe airway is no longer patent.  Could be posttreatment related versus recurrent disease.   -PET/CT (02/08/2023) showed radiation changes in the right upper and medial left upper lobe.  No suspicious findings for residual or recurrent lung cancer.  Focal left prostate hypermetabolic some raising possibility of prostate cancer.  - MRI brain with no evidence of intracranial metastasis.  Unchanged subcentimeter enhancing focus in the right parietal calvarium nonspecific.  -Labs reviewed and acceptable for treatment.  Will proceed with cycle 8 of of Durvalumab.  Scheduled for repeat CT chest with contrast in September 2024.  # Incidental hypermetabolism in prostate -Denies any urinary symptoms.  PSA level is normal at 3.14. -Scheduled to see Dr. [name] on 04/06/23.  # Mild anemia  # Leukopenia -Likely secondary to prior chemoradiation  # Access - port placed on 05/28/2022   No orders of the defined types were placed in this encounter.  RTC in 1 month for MD visit, labs, cycle 9 of Durvalumab  The total time spent in the appointment was 30 minutes encounter with patients including review of chart  and various tests results, discussions about plan of care and coordination of care plan   All questions were answered. The patient knows to  call the clinic with any problems, questions or concerns. No barriers to learning was detected.  Michaelyn Barter, MD 7/30/20249:39 AM   HISTORY OF PRESENTING ILLNESS:  Derek Webb 72 y.o. 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  Patient was seen today accompanied by [name].  Denies any cough, hemoptysis, shortness of breath and weight loss. Remote smoker.  Quit in 2013. Has family history of lung cancer in father.  INTERVAL HISTORY-  Patient seen today prior to cycle 8 of Durvalumab.  Accompanied by sister. He is tolerating treatments well. Patient denies fever, chills, nausea, vomiting, shortness of breath, cough, abdominal pain, bleeding, bowel or bladder issues. Energy level is good.  Appetite is good.  Denies any weight loss. Denies pain.   I have reviewed his chart and materials related to his cancer extensively and collaborated history with the patient. Summary of oncologic history is as follows: Oncology History Overview Note  History of left upper lobe lung SCC status post microwave thermal ablation in 2013 by Dr. Fredia Sorrow.  He was deemed not a surgical candidate and had transportation issues for radiation treatment.   Squamous cell lung cancer (HCC)  01/12/2022 Initial Diagnosis   Patient has been following with Dr. Welton Flakes of pulmonary for suspicious lung nodule. CT chest in March 2021 showed post ablation scarring in LUL not changed and increased atelectasis of RML and RUL. He did not follow through.   CT chest in 01/12/2022 showed  New abrupt and slightly irregular cut off of the right upper lobe bronchus with associated right upper lobe atelectasis and postobstructive changes. Findings are concerning for endobronchial malignancy    04/08/2022 PET scan    IMPRESSION: 1. Hypermetabolic central right upper lobe lung mass nearly occluding the right upper lobe bronchus, poorly delineated on the noncontrast CT images, measuring approximately 3.8 x 2.9 cm, compatible with primary bronchogenic malignancy. 2. Postobstructive pneumonia/atelectasis throughout the basilar right upper lobe. 3. No hypermetabolic metastatic thoracic adenopathy or distant metastatic disease. 4. Stable mild post ablation change in the left upper lobe with no metabolic evidence of local tumor recurrence in this location. 5. Dilated 4.4 cm ascending thoracic aorta. Recommend annual imaging followup by CTA or MRA   05/12/2022 Procedure   Procedure was delayed due to miscommunication about holding Plavix. S/p bronchoscopy with EBUS by Dr. Jayme Cloud. Op note mentions significant bulky adenopathy in the subcarinal space and in the right hilum.    05/12/2022 Pathology Results   DIAGNOSIS:  A.  LUNG, RIGHT UPPER LOBE; ENB-ASSISTED BIOPSY:  - SQUAMOUS CELL CARCINOMA.   LUNG, RIGHT UPPER LOBE; EBUS-ASSISTED BRUSHING:  - POSITIVE FOR MALIGNANCY.  - SQUAMOUS CELL CARCINOMA IS PRESENT.   LUNG, RIGHT UPPER LOBE; EBUS-ASSISTED LAVAGE:  - POSITIVE FOR MALIGNANCY.  - SQUAMOUS CELL CARCINOMA IS PRESENT  SUBCARINAL SPACE, RIGHT; EBUS-ASSISTED FNA:  - POSITIVE FOR MALIGNANCY.  - SQUAMOUS CELL CARCINOMA IS PRESENT   05/18/2022 Cancer Staging   Staging form: Lung, AJCC 7th Edition - Clinical: Stage IIIA (T4, N0, M0) - Signed by Michaelyn Barter, MD on 06/03/2022   05/21/2022 Imaging   MRI brain  No evidence of metastatic disease in the brain. 2.  Small enhancing foci in the right parietal and right occipital calvarium, which are indeterminate but could represent small osseous metastases. Attention on follow-up   06/03/2022 - 07/27/2022 Chemotherapy   Patient is on Treatment Plan : LUNG Carboplatin + Paclitaxel + XRT q7d      08/03/2022 -  Chemotherapy   Patient is on Treatment  Plan : LUNG NSCLC Durvalumab (1500) q28d        MEDICAL HISTORY:  Past Medical History:  Diagnosis Date   Alcoholism (HCC)    COPD (chronic obstructive pulmonary disease) (HCC)    SEVERE COPD -OCCASIONALLY USES OXYGEN AT NIGHT-DOES NOT USE INHALERS ON REGULAR BASIS   Elevated cholesterol    GERD (gastroesophageal reflux disease)    HOH (hard of hearing)    Hypertension    Lung cancer (HCC)    LEFT UPPER LUNG CARCINOMA-NOT A CANDIDATE FOR SURGICAL RESECTION BECAUSE OF HIS COPD AND POOR CANDIDATE FOR RADIATION BECAUSE OF TRANSPORTATION PROBLEMS   Seizures (HCC)    CHRONIC DILATIN - PT STATES NO SEIZURES IN PAST COUPLE OF YEARS; PT STATES HIS SEIZURES CAUSED NUMBNESS OF ARMS AND HANDS AND NOT ABLE TO MOVE HIS ARMS   Stroke (HCC)    2 TO 3 YRS AGO-AFFECTED HIS SPEECH--ABLE TO AMBULATE WITHOUT ASSIST AND DOES YARD WORK.  DECREASED HEARING IN BOTH EARS SINCE STROKE.   Vitamin D deficiency     SURGICAL HISTORY: Past Surgical History:  Procedure Laterality Date   CAROTID SURGERY 2009 -LEFT     COLON POLYP EXCISION     COLON SURGERY     COLONOSCOPY WITH PROPOFOL N/A 04/18/2017   Procedure: COLONOSCOPY WITH PROPOFOL;  Surgeon: Scot Jun, MD;  Location: [facility] END[facility]COPY;  Service: Endoscopy;  Laterality: N/A;   ESOPHAGOGASTRODUODENOSCOPY     IR GENERIC HISTORICAL  01/21/2016   IR RADIOLOGIST EVAL & MGMT 01/21/2016 Irish Lack, MD [facility] INTERV RAD   IR GENERIC HISTORICAL  10/22/2014   IR RADIOLOGIST EVAL & MGMT 10/22/2014 Irish Lack, MD [facility] INTERV RAD   IR IMAGING GUIDED PORT INSERTION  05/28/2022   IR RADIOLOGIST EVAL & MGMT  02/02/2017   IR RADIOLOGIST EVAL & MGMT  03/08/2018   percutaneous biopsy     VIDEO BRONCHOSCOPY WITH ENDOBRONCHIAL ULTRASOUND Right 05/12/2022   Procedure: VIDEO BRONCHOSCOPY WITH ENDOBRONCHIAL ULTRASOUND;  Surgeon: Salena Saner, MD;  Location: [facility] ORS;  Service: Cardiopulmonary;  Laterality: Right;    SOCIAL HISTORY: Social History    Socioeconomic History   Marital status: Single    Spouse name: Not on file   Number of children: Not on file   Years of education: Not on file   Highest education level: Not on file  Occupational History   Not on file  Tobacco Use   Smoking status: Former    Current packs/day: 0.00    Average packs/day: 1 pack/day for 35.0 years (35.0 ttl pk-yrs)    Types: Cigarettes    Start date: 12/21/1976    Quit date: 12/22/2011    Years since quitting: 11.2   Smokeless tobacco: Never  Vaping Use   Vaping status: Never Used  Substance and Sexual Activity   Alcohol use: Yes    Comment: occosionally    Drug use: No   Sexual activity: Not on file  Other Topics Concern   Not on file  Social History Narrative   Not on file   Social Determinants of Health   Financial Resource Strain: Low Risk  (11/28/2020)   Overall Financial Resource  Strain (CARDIA)    Difficulty of Paying Living Expenses: Not hard at all  Food Insecurity: No Food Insecurity (06/15/2022)   Hunger Vital Sign    Worried About Running Out of Food in the Last Year: Never true    Ran Out of Food in the Last Year: Never true  Transportation Needs: No Transportation Needs (06/15/2022)   PRAPARE - Administrator, Civil Service (Medical): No    Lack of Transportation (Non-Medical): No  Physical Activity: Inactive (06/15/2022)   Exercise Vital Sign    Days of Exercise per Week: 0 days    Minutes of Exercise per Session: 0 min  Stress: Stress Concern Present (06/15/2022)   [employer] of Occupational Health - Occupational Stress Questionnaire    Feeling of Stress : To some extent  Social Connections: Socially Isolated (06/15/2022)   Social Connection and Isolation Panel [NHANES]    Frequency of Communication with Friends and Family: More than three times a week    Frequency of Social Gatherings with Friends and Family: More than three times a week    Attends Religious Services: Never    Database administrator  or Organizations: No    Attends Banker Meetings: Never    Marital Status: Never married  Intimate Partner Violence: Unknown (06/15/2022)   Humiliation, Afraid, Rape, and Kick questionnaire    Fear of Current or Ex-Partner: No    Emotionally Abused: Not on file    Physically Abused: No    Sexually Abused: No    FAMILY HISTORY: Family History  Problem Relation Age of Onset   Colon polyps Sister     ALLERGIES:  has No Known Allergies.  MEDICATIONS:  Current Outpatient Medications  Medication Sig Dispense Refill   aspirin 81 MG tablet Take 81 mg by mouth daily.     atorvastatin (LIPITOR) 80 MG tablet Take 1 tablet (80 mg total) by mouth daily. 90 tablet 3   budesonide-formoterol (SYMBICORT) 160-4.5 MCG/ACT inhaler Inhale 1 puff into the lungs 2 (two) times daily. 1 each 5   Cholecalciferol (VITAMIN D) 2000 UNITS CAPS Take by mouth. VITAMIN D 3 2000 IU  ONE SOFTGEL DAILY  --OTC     clopidogrel (PLAVIX) 75 MG tablet Take 1 tablet (75 mg total) by mouth daily. 90 tablet 3   enalapril (VASOTEC) 20 MG tablet Take 1 tablet (20 mg total) by mouth daily. 90 tablet 3   ezetimibe (ZETIA) 10 MG tablet Take 1 tablet (10 mg total) by mouth daily. 90 tablet 3   Na Sulfate-K Sulfate-Mg Sulf 17.5-3.13-1.6 GM/177ML SOLN Take by mouth.     OXYGEN Inhale into the lungs. At night     pantoprazole (PROTONIX) 40 MG tablet Take 1 tablet (40 mg total) by mouth daily. IN AM 90 tablet 2   phenytoin (DILANTIN) 100 MG ER capsule Take 4 capsules (400 mg total) by mouth at bedtime. 360 capsule 0   No current facility-administered medications for this visit.    REVIEW OF SYSTEMS:   Pertinent information mentioned in HPI All other systems were reviewed with the patient and are negative.  PHYSICAL EXAMINATION: ECOG PERFORMANCE STATUS: 2 - Symptomatic, <50% confined to bed  There were no vitals filed for this visit.    There were no vitals filed for this visit.      GENERAL:alert, no  distress and comfortable SKIN: skin color, texture, turgor are normal, no rashes or significant lesions EYES: normal, conjunctiva are pink and non-injected, sclera  clear OROPHARYNX:no exudate, no erythema and lips, buccal mucosa, and tongue normal  NECK: supple, thyroid normal size, non-tender, without nodularity LYMPH:  no palpable lymphadenopathy in the cervical, axillary or inguinal LUNGS: clear to auscultation and percussion with normal breathing effort HEART: regular rate & rhythm and no murmurs and no lower extremity edema ABDOMEN:abdomen soft, non-tender and normal bowel sounds Musculoskeletal:no cyanosis of digits and no clubbing  PSYCH: alert & oriented x 3 with fluent speech NEURO: no focal motor/sensory deficits  LABORATORY DATA:  I have reviewed the data as listed Lab Results  Component Value Date   WBC 3.5 (L) 02/22/2023   HGB 12.4 (L) 02/22/2023   HCT 36.2 (L) 02/22/2023   MCV 87.2 02/22/2023   PLT 413 (H) 02/22/2023   Recent Labs    12/31/22 0916 01/25/23 0903 02/22/23 0831  NA 136 136 135  K 3.9 4.4 3.8  CL 101 104 102  CO2 25 27 25   GLUCOSE 88 103* 101*  BUN 12 12 12   CREATININE 0.45* 0.63 0.65  CALCIUM 9.1 8.9 9.2  GFRNONAA >60 >60 >60  PROT 7.7 7.6 7.9  ALBUMIN 3.6 3.5 3.7  AST 19 19 18   ALT 19 18 19   ALKPHOS 108 115 109  BILITOT 0.6 0.3 0.3    RADIOGRAPHIC STUDIES: I have personally reviewed the radiological images as listed and agreed with the findings in the report. No results found.

## 2023-03-22 NOTE — Patient Instructions (Signed)
[facility] [place] [facility]  Discharge Instructions: Thank you for choosing [facility] to provide your oncology and hematology care.  If you have a lab appointment with the Cancer Center, please go directly to the Cancer Center and check in at the registration area.  Wear comfortable clothing and clothing appropriate for easy access to any Portacath or PICC line.   We strive to give you quality time with your provider. You may need to reschedule your appointment if you arrive late (15 or more minutes).  Arriving late affects you and other patients whose appointments are after yours.  Also, if you miss three or more appointments without notifying the office, you may be dismissed from the clinic at the provider's discretion.      For prescription refill requests, have your pharmacy contact our office and allow 72 hours for refills to be completed.    Today you received the following chemotherapy and/or immunotherapy agents Imfinzi        To help prevent nausea and vomiting after your treatment, we encourage you to take your nausea medication as directed.  BELOW ARE SYMPTOMS THAT SHOULD BE REPORTED IMMEDIATELY: *FEVER GREATER THAN 100.4 F (38 C) OR HIGHER *CHILLS OR SWEATING *NAUSEA AND VOMITING THAT IS NOT CONTROLLED WITH YOUR NAUSEA MEDICATION *UNUSUAL SHORTNESS OF BREATH *UNUSUAL BRUISING OR BLEEDING *URINARY PROBLEMS (pain or burning when urinating, or frequent urination) *BOWEL PROBLEMS (unusual diarrhea, constipation, pain near the anus) TENDERNESS IN MOUTH AND THROAT WITH OR WITHOUT PRESENCE OF ULCERS (sore throat, sores in mouth, or a toothache) UNUSUAL RASH, SWELLING OR PAIN  UNUSUAL VAGINAL DISCHARGE OR ITCHING   Items with * indicate a potential emergency and should be followed up as soon as possible or go to the Emergency Department if any problems should occur.  Please show the CHEMOTHERAPY ALERT CARD or IMMUNOTHERAPY ALERT CARD at check-in to  the Emergency Department and triage nurse.  Should you have questions after your visit or need to cancel or reschedule your appointment, please contact [facility] [place] [facility]  [phone] and follow the prompts.  Office hours are 8:00 a.m. to 4:30 p.m. Monday - Friday. Please note that voicemails left after 4:00 p.m. may not be returned until the following business day.  We are closed weekends and major holidays. You have access to a nurse at all times for urgent questions. Please call the main number to the clinic [phone] and follow the prompts.  For any non-urgent questions, you may also contact your provider using MyChart. We now offer e-Visits for anyone 18 and older to request care online for non-urgent symptoms. For details visit [email or web address].   Also download the MyChart app! Go to the app store, search "MyChart", open the app, select [place], and log in with your MyChart username and password.    

## 2023-03-24 DIAGNOSIS — J449 Chronic obstructive pulmonary disease, unspecified: Secondary | ICD-10-CM | POA: Diagnosis not present

## 2023-04-06 ENCOUNTER — Ambulatory Visit (INDEPENDENT_AMBULATORY_CARE_PROVIDER_SITE_OTHER): Payer: 59 | Admitting: Urology

## 2023-04-06 ENCOUNTER — Encounter: Payer: Self-pay | Admitting: Urology

## 2023-04-06 VITALS — BP 113/72 | HR 68 | Wt 186.0 lb

## 2023-04-06 DIAGNOSIS — N429 Disorder of prostate, unspecified: Secondary | ICD-10-CM

## 2023-04-06 NOTE — Progress Notes (Signed)
Marcelle Overlie Plume,acting as a scribe for Vanna Scotland, MD.,have documented all relevant documentation on the behalf of Vanna Scotland, MD,as directed by  Vanna Scotland, MD while in the presence of Vanna Scotland, MD.  04/06/2023 11:40 AM   Nicoletta Ba 1951/06/02 [id number]  Referring provider: Michaelyn Barter, MD [address] [place],  [place] [address]  Chief Complaint  Patient presents with   hypermetabolism    HPI: 72 year-old male who presents today for incidental hypermetabolism in his prostate. He is referred by Dr. Alena Bills.   He has a personal history of stage 3a squamous cell lung carcinoma. He is underwent treatments with carboplatinum, Taxol, and radiation completed in 07/2022. He is now on maintenance durvalumab for a total of 12 cycles.  On his most recent PET CT scan in 01/2023, he had radiation changes of his lungs, but no residual recurrent lung cancer. There was incidental hypermetabolic changes in the left prostate.   His most recent PSA on 02/22/2023 was 3.14.   Today, he denies any urinary symptoms and is unsure about his family history of prostate cancer   PMH: Past Medical History:  Diagnosis Date   Alcoholism (HCC)    COPD (chronic obstructive pulmonary disease) (HCC)    SEVERE COPD -OCCASIONALLY USES OXYGEN AT NIGHT-DOES NOT USE INHALERS ON REGULAR BASIS   Elevated cholesterol    GERD (gastroesophageal reflux disease)    HOH (hard of hearing)    Hypertension    Lung cancer (HCC)    LEFT UPPER LUNG CARCINOMA-NOT A CANDIDATE FOR SURGICAL RESECTION BECAUSE OF HIS COPD AND POOR CANDIDATE FOR RADIATION BECAUSE OF TRANSPORTATION PROBLEMS   Seizures (HCC)    CHRONIC DILATIN - PT STATES NO SEIZURES IN PAST COUPLE OF YEARS; PT STATES HIS SEIZURES CAUSED NUMBNESS OF ARMS AND HANDS AND NOT ABLE TO MOVE HIS ARMS   Stroke (HCC)    2 TO 3 YRS AGO-AFFECTED HIS SPEECH--ABLE TO AMBULATE WITHOUT ASSIST AND DOES YARD WORK.  DECREASED HEARING IN BOTH EARS SINCE  STROKE.   Vitamin D deficiency     Surgical History: Past Surgical History:  Procedure Laterality Date   CAROTID SURGERY 2009 -LEFT     COLON POLYP EXCISION     COLON SURGERY     COLONOSCOPY WITH PROPOFOL N/A 04/18/2017   Procedure: COLONOSCOPY WITH PROPOFOL;  Surgeon: Scot Jun, MD;  Location: [facility] ENDOSCOPY;  Service: Endoscopy;  Laterality: N/A;   ESOPHAGOGASTRODUODENOSCOPY     IR GENERIC HISTORICAL  01/21/2016   IR RADIOLOGIST EVAL & MGMT 01/21/2016 Irish Lack, MD [facility] INTERV RAD   IR GENERIC HISTORICAL  10/22/2014   IR RADIOLOGIST EVAL & MGMT 10/22/2014 Irish Lack, MD [facility] INTERV RAD   IR IMAGING GUIDED PORT INSERTION  05/28/2022   IR RADIOLOGIST EVAL & MGMT  02/02/2017   IR RADIOLOGIST EVAL & MGMT  03/08/2018   percutaneous biopsy     VIDEO BRONCHOSCOPY WITH ENDOBRONCHIAL ULTRASOUND Right 05/12/2022   Procedure: VIDEO BRONCHOSCOPY WITH ENDOBRONCHIAL ULTRASOUND;  Surgeon: Salena Saner, MD;  Location: [facility] ORS;  Service: Cardiopulmonary;  Laterality: Right;    Home Medications:  Allergies as of 04/06/2023   No Known Allergies      Medication List        Accurate as of April 06, 2023 11:40 AM. If you have any questions, ask your nurse or doctor.          aspirin 81 MG tablet Take 81 mg by mouth daily.   atorvastatin  80 MG tablet Commonly known as: LIPITOR Take 1 tablet (80 mg total) by mouth daily.   budesonide-formoterol 160-4.5 MCG/ACT inhaler Commonly known as: SYMBICORT Inhale 1 puff into the lungs 2 (two) times daily.   clopidogrel 75 MG tablet Commonly known as: PLAVIX Take 1 tablet (75 mg total) by mouth daily.   enalapril 20 MG tablet Commonly known as: VASOTEC Take 1 tablet (20 mg total) by mouth daily.   ezetimibe 10 MG tablet Commonly known as: ZETIA Take 1 tablet (10 mg total) by mouth daily.   Na Sulfate-K Sulfate-Mg Sulf 17.5-3.13-1.6 GM/177ML Soln Take by mouth.   OXYGEN Inhale into the lungs. At night    pantoprazole 40 MG tablet Commonly known as: PROTONIX Take 1 tablet (40 mg total) by mouth daily. IN AM   phenytoin 100 MG ER capsule Commonly known as: DILANTIN Take 4 capsules (400 mg total) by mouth at bedtime.   Vitamin D 50 MCG (2000 UT) Caps Take by mouth. VITAMIN D 3 2000 IU  ONE SOFTGEL DAILY  --OTC        Family History: Family History  Problem Relation Age of Onset   Colon polyps Sister     Social History:  reports that he quit smoking about 11 years ago. His smoking use included cigarettes. He started smoking about 46 years ago. He has a 35 pack-year smoking history. He has never used smokeless tobacco. He reports current alcohol use. He reports that he does not use drugs.   Physical Exam: BP 113/72   Pulse 68   Wt 186 lb (84.4 kg)   BMI 29.13 kg/m   Constitutional:  Alert and oriented, No acute distress. Patient is hard of hearing and his wife seems agitated/annoyed. HEENT: [place] AT, moist mucus membranes.  Trachea midline, no masses. GU: Prostate 30 grams, non-tender. No nodules Neurologic: Grossly intact, no focal deficits, moving all 4 extremities. Psychiatric: Normal mood and affect.  Pertinent Imaging: EXAM: NUCLEAR MEDICINE PET SKULL BASE TO THIGH   TECHNIQUE: 10.3 mCi F-18 FDG was injected intravenously. Full-ring PET imaging was performed from the skull base to thigh after the radiotracer. CT data was obtained and used for attenuation correction and anatomic localization.   Fasting blood glucose: 78 mg/dl   COMPARISON:  CT chest dated 01/19/2023.  PET-CT dated 04/08/2022.   FINDINGS: Mediastinal blood pool activity: SUV max 1.8   Liver activity: SUV max NA   NECK: No hypermetabolic cervical lymphadenopathy.   Incidental CT findings: None.   CHEST: Posterior right upper lobe patchy opacity with volume loss, favoring radiation changes. Specifically, no focal hypermetabolism along the right upper lobe bronchus to suggest  residual/recurrent obstructing tumor.   Additional patchy opacity in the medial left upper lobe, also favoring radiation changes.   No hypermetabolic pulmonary nodules.   Small bilateral axillary nodes, including an 8 mm short axis left axillary node (series 4/image 50) with max SUV 1.8, likely reactive. No suspicious thoracic lymphadenopathy.   Right chest port terminates at the cavoatrial junction.   Incidental CT findings: Volume loss in the right hemithorax with rightward cardiomediastinal shift. Atherosclerotic calcifications of the aortic arch. Mild coronary atherosclerosis of the LAD. Trace right pleural effusion.   ABDOMEN/PELVIS: No abnormal hypermetabolism in the liver, spleen, pancreas, or adrenal glands.   Small bilateral inguinal nodes, including a 9 mm short axis right inguinal node with preservation of the normal fatty hilum (series 4/image 155), max SUV 2.4, likely reactive. No suspicious abdominopelvic lymphadenopathy.   Focal hypermetabolism  in the left peripheral zone of the prostate, max SUV 13.4.   Incidental CT findings: Coarse calcifications in the portacaval region. Atherosclerotic calcifications the abdominal aorta and branch vessels. Bladder is decompressed.   SKELETON: No focal hypermetabolic activity to suggest skeletal metastasis.   Incidental CT findings: Degenerative changes of the visualized thoracolumbar spine.   IMPRESSION: Radiation changes in the right upper lobe and medial left upper lobe. No findings suspicious for residual/recurrent primary bronchogenic carcinoma.   No findings suspicious for metastatic disease.   Focal left prostate hypermetabolism, raising the possibility of prostate carcinoma. Correlate with PSA and consider urologic consultation as clinically warranted.     Electronically Signed   By: Charline Bills M.D.   On: 02/13/2023 00:06  This was personally reviewed and I agree with the radiologic  interpretation. I also reviewed previous scans which actually did show hypermetabolism, perhaps even more pronounced from almost a year ago in a similar location, that was not read on the report.   Assessment & Plan:    1. Prostate abnormality/hypermetabolism - Benign DRE today and his PSA is low, which is reassuring - Low suspicion for prostate cancer - Noted perhaps improved on most recent PET scan and could represent an area of inflammation, prostatitis versus underlying malignancy - If PSA rises greater than 0.75 point annually, refer back - Prostate MRI to rule out any gross abnormality - Suspect hypermetabolism is inflammatory/incidental - We will call with the MRI results  Return in about 1 year (around 04/05/2024) for repeat PSA.  I have reviewed the above documentation for accuracy and completeness, and I agree with the above.   Vanna Scotland, MD   [facility] [address] [place], [place] [address] [phone]

## 2023-04-14 ENCOUNTER — Ambulatory Visit: Payer: 59

## 2023-04-19 ENCOUNTER — Inpatient Hospital Stay: Payer: 59

## 2023-04-19 ENCOUNTER — Ambulatory Visit
Admission: RE | Admit: 2023-04-19 | Discharge: 2023-04-19 | Disposition: A | Discharge status: Home / Self Care | Payer: 59 | Source: Ambulatory Visit | Attending: Internal Medicine | Admitting: Internal Medicine

## 2023-04-19 ENCOUNTER — Inpatient Hospital Stay: Payer: 59 | Attending: Internal Medicine | Admitting: Internal Medicine

## 2023-04-19 VITALS — BP 118/72 | HR 76 | Temp 98.7°F | Wt 187.0 lb

## 2023-04-19 DIAGNOSIS — C3411 Malignant neoplasm of upper lobe, right bronchus or lung: Secondary | ICD-10-CM | POA: Diagnosis not present

## 2023-04-19 DIAGNOSIS — D649 Anemia, unspecified: Secondary | ICD-10-CM | POA: Diagnosis not present

## 2023-04-19 DIAGNOSIS — Z8673 Personal history of transient ischemic attack (TIA), and cerebral infarction without residual deficits: Secondary | ICD-10-CM | POA: Insufficient documentation

## 2023-04-19 DIAGNOSIS — K5730 Diverticulosis of large intestine without perforation or abscess without bleeding: Secondary | ICD-10-CM | POA: Diagnosis not present

## 2023-04-19 DIAGNOSIS — D72819 Decreased white blood cell count, unspecified: Secondary | ICD-10-CM | POA: Insufficient documentation

## 2023-04-19 DIAGNOSIS — C3491 Malignant neoplasm of unspecified part of right bronchus or lung: Secondary | ICD-10-CM

## 2023-04-19 DIAGNOSIS — J440 Chronic obstructive pulmonary disease with acute lower respiratory infection: Secondary | ICD-10-CM | POA: Insufficient documentation

## 2023-04-19 DIAGNOSIS — I10 Essential (primary) hypertension: Secondary | ICD-10-CM | POA: Diagnosis not present

## 2023-04-19 DIAGNOSIS — D509 Iron deficiency anemia, unspecified: Secondary | ICD-10-CM | POA: Insufficient documentation

## 2023-04-19 DIAGNOSIS — Z5112 Encounter for antineoplastic immunotherapy: Secondary | ICD-10-CM | POA: Insufficient documentation

## 2023-04-19 LAB — IRON AND TIBC
Iron: 29 ug/dL — ABNORMAL LOW (ref 45–182)
Saturation Ratios: 12 % — ABNORMAL LOW (ref 17.9–39.5)
TIBC: 239 ug/dL — ABNORMAL LOW (ref 250–450)
UIBC: 210 ug/dL

## 2023-04-19 LAB — CBC WITH DIFFERENTIAL/PLATELET
Abs Immature Granulocytes: 0.03 10*3/uL (ref 0.00–0.07)
Basophils Absolute: 0 10*3/uL (ref 0.0–0.1)
Basophils Relative: 1 %
Eosinophils Absolute: 0.1 10*3/uL (ref 0.0–0.5)
Eosinophils Relative: 2 %
HCT: 22.2 % — ABNORMAL LOW (ref 39.0–52.0)
Hemoglobin: 7.4 g/dL — ABNORMAL LOW (ref 13.0–17.0)
Immature Granulocytes: 1 %
Lymphocytes Relative: 16 %
Lymphs Abs: 0.8 10*3/uL (ref 0.7–4.0)
MCH: 30.5 pg (ref 26.0–34.0)
MCHC: 33.3 g/dL (ref 30.0–36.0)
MCV: 91.4 fL (ref 80.0–100.0)
Monocytes Absolute: 0.6 10*3/uL (ref 0.1–1.0)
Monocytes Relative: 11 %
Neutro Abs: 3.5 10*3/uL (ref 1.7–7.7)
Neutrophils Relative %: 69 %
Platelets: 337 10*3/uL (ref 150–400)
RBC: 2.43 MIL/uL — ABNORMAL LOW (ref 4.22–5.81)
RDW: 14.6 % (ref 11.5–15.5)
WBC: 5.1 10*3/uL (ref 4.0–10.5)
nRBC: 0 % (ref 0.0–0.2)

## 2023-04-19 LAB — CBC WITH DIFFERENTIAL (CANCER CENTER ONLY)
Abs Immature Granulocytes: 0.04 10*3/uL (ref 0.00–0.07)
Basophils Absolute: 0 10*3/uL (ref 0.0–0.1)
Basophils Relative: 1 %
Eosinophils Absolute: 0.1 10*3/uL (ref 0.0–0.5)
Eosinophils Relative: 2 %
HCT: 23 % — ABNORMAL LOW (ref 39.0–52.0)
Hemoglobin: 7.6 g/dL — ABNORMAL LOW (ref 13.0–17.0)
Immature Granulocytes: 1 %
Lymphocytes Relative: 16 %
Lymphs Abs: 0.9 10*3/uL (ref 0.7–4.0)
MCH: 30.4 pg (ref 26.0–34.0)
MCHC: 33 g/dL (ref 30.0–36.0)
MCV: 92 fL (ref 80.0–100.0)
Monocytes Absolute: 0.6 10*3/uL (ref 0.1–1.0)
Monocytes Relative: 12 %
Neutro Abs: 3.6 10*3/uL (ref 1.7–7.7)
Neutrophils Relative %: 68 %
Platelet Count: 342 10*3/uL (ref 150–400)
RBC: 2.5 MIL/uL — ABNORMAL LOW (ref 4.22–5.81)
RDW: 14.7 % (ref 11.5–15.5)
WBC Count: 5.3 10*3/uL (ref 4.0–10.5)
nRBC: 0 % (ref 0.0–0.2)

## 2023-04-19 LAB — TYPE AND SCREEN
ABO/RH(D): O POS
Antibody Screen: NEGATIVE

## 2023-04-19 LAB — COMPREHENSIVE METABOLIC PANEL
ALT: 12 U/L (ref 0–44)
AST: 15 U/L (ref 15–41)
Albumin: 3.2 g/dL — ABNORMAL LOW (ref 3.5–5.0)
Alkaline Phosphatase: 89 U/L (ref 38–126)
Anion gap: 6 (ref 5–15)
BUN: 12 mg/dL (ref 8–23)
CO2: 25 mmol/L (ref 22–32)
Calcium: 8.3 mg/dL — ABNORMAL LOW (ref 8.9–10.3)
Chloride: 106 mmol/L (ref 98–111)
Creatinine, Ser: 0.52 mg/dL — ABNORMAL LOW (ref 0.61–1.24)
GFR, Estimated: >60 mL/min (ref >60)
Glucose, Bld: 98 mg/dL (ref 70–99)
Potassium: 3.6 mmol/L (ref 3.5–5.1)
Sodium: 137 mmol/L (ref 135–145)
Total Bilirubin: 0.3 mg/dL (ref 0.3–1.2)
Total Protein: 6.6 g/dL (ref 6.5–8.1)

## 2023-04-19 LAB — TSH: TSH: 1.378 u[IU]/mL (ref 0.350–4.500)

## 2023-04-19 LAB — FERRITIN: Ferritin: 12 ng/mL — ABNORMAL LOW (ref 24–336)

## 2023-04-19 LAB — LACTATE DEHYDROGENASE: LDH: 62 U/L — ABNORMAL LOW (ref 98–192)

## 2023-04-19 MED ORDER — SODIUM CHLORIDE 0.9 % IV SOLN
Freq: Once | INTRAVENOUS | Status: AC
  Filled 2023-04-19: qty 250

## 2023-04-19 MED ORDER — SODIUM CHLORIDE 0.9 % IV SOLN
1500.0000 mg | Freq: Once | INTRAVENOUS | Status: AC
  Administered 2023-04-19: 1500 mg via INTRAVENOUS
  Filled 2023-04-19: qty 30

## 2023-04-19 MED ORDER — HEPARIN SOD (PORK) LOCK FLUSH 100 UNIT/ML IV SOLN
500.0000 [IU] | Freq: Once | INTRAVENOUS | Status: DC | PRN
  Filled 2023-04-19: qty 5

## 2023-04-19 NOTE — Progress Notes (Signed)
[facility] CONSULT NOTE  Patient Care Team: [name], [name] as PCP - General (Physician Assistant) [name], [facility] (Inactive) as Pharmacist (Pharmacist) [name], RN as Oncology Nurse Navigator Michaelyn Barter, MD as Consulting Physician (Oncology) Yevonne Pax, MD as Consulting Physician (Internal Medicine)   CANCER STAGING   Cancer Staging  Squamous cell lung cancer [facility]) Staging form: Lung, AJCC 7th Edition - Clinical: Stage IIIA (T4, N0, M0) - Signed by Michaelyn Barter, MD on 06/03/2022  CURRENT TREATMENT- Weekly Carboplatin and Taxol with RT completed on 07/27/2022.  Maintenance Durvalumab started 08/13/2022. Plan total 12 cycles.   ASSESSMENT & PLAN:  [name] 72 y.o. 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  #RUL of lung SCCa, atleast Stage IIIA (cT4N0M0) #Encounter for antineoplastic immunotherapy -s/p bronchoscopy with EBUS by Dr. Jayme Cloud on 05/12/2022.  -Completed 5 cycles of CarboTaxol and RT on 07/27/2022. Cycle 6 was canceled due to persistent neutropenia.  Repeat CT chest done on 1/15 showed near complete resolution of right upper lobe mass, persistent.  Obstructive atelectasis seen.  No adenopathy.  Patient had excellent response to the treatment.    - CT chest with contrast (11/30/2022) was reviewed at the tumor conference.  There is interval development of marked interstitial disease and patchy airspace disease more confluent consolidative opacity in the peripheral right upper lobe progressive and interval.  In the lower lung there is ill-defined tree-in-bud nodularity with nodular areas measuring up to 1.4 cm in the posterior left lower lobe.  Features are suggestive of infectious and inflammatory etiology however disease progression  cannot be excluded.  Liver lesion is stable and likely hemangioma.  Patient continues to be asymptomatic.  Cycle 6 of Durvalumab was held.    - Repeat CT chest with contrast (01/19/2023) showed interval improvement in the diffuse interstitial lung disease seen on prior study.  But there is still some persistent interstitial patchy area of consolidative airspace in the left upper lobe.  Ill-defined nodularity in the lower lobe has largely resolved although some component of dominant nodularity persist.  There is continued progression of volume loss in the right hemithorax with consolidative opacity in the right posterior apex and suprahilar right lung.  Right upper lobe airway is no longer patent.  Could be posttreatment related versus recurrent disease.   -PET/CT (02/08/2023) showed radiation changes in the right upper and medial left upper lobe.  No suspicious findings for residual or recurrent lung cancer.  Focal left prostate hypermetabolic some raising possibility of prostate cancer.  - MRI brain with no evidence of intracranial metastasis.  Unchanged subcentimeter enhancing focus in the right parietal calvarium nonspecific.  -Labs reviewed.  Hemoglobin has significantly dropped from 11.8-7.4.  Repeat CBC was performed hemoglobin 7.6.  Patient denies any bleeding in urine or stools.  He is vitally stable.  CMP bilirubin is normal so concern for hemolysis is low.  Will proceed with Durvalumab cycle 9 today.  Patient is on dual antiplatelet agent.  Advised him to stop Plavix.  Will obtain stat CT abdomen without contrast to rule out retroperitoneal bleed.  Urgent referral to GI.  Ferritin is 12, iron 29 and saturation 12%.  Consistent with iron deficiency anemia likely secondary to bleed.  Will schedule him for repeat CBC on Thursday with IV Venofer.  I will  schedule him for IV Venofer twice weekly x 4 doses.  Will also check for LDH, haptoglobin, reticulocyte, B12 and folate.  # Anemia -Workup as  above  # Incidental hypermetabolism in prostate -Denies any urinary symptoms.  PSA level is normal at 3.14. -Follows with Dr. [name]  # Mild anemia  # Leukopenia -Likely secondary to prior chemoradiation  # Access - port placed on 05/28/2022   Orders Placed This Encounter  Procedures   CT ABDOMEN PELVIS WO CONTRAST    Standing Status:   Future    Number of Occurrences:   1    Standing Expiration Date:   04/18/2024    Order Specific Question:   Preferred imaging location?    Answer:   [facility]    Order Specific Question:   If indicated for the ordered procedure, I authorize the administration of oral contrast media per Radiology protocol    Answer:   Yes    Order Specific Question:   Does the patient have a contrast media/X-ray dye allergy?    Answer:   No   CBC with Differential    Standing Status:   Future    Standing Expiration Date:   05/16/2024   Comprehensive metabolic panel    Standing Status:   Future    Standing Expiration Date:   05/16/2024   CBC with Differential (Cancer Center Only)    Standing Status:   Future    Number of Occurrences:   1    Standing Expiration Date:   04/18/2024   Iron and TIBC([facility])    Standing Status:   Future    Number of Occurrences:   1    Standing Expiration Date:   04/18/2024   Ferritin    Standing Status:   Future    Number of Occurrences:   1    Standing Expiration Date:   04/18/2024   CBC with Differential (Cancer Center Only)    Standing Status:   Future    Standing Expiration Date:   04/18/2024   Lactate dehydrogenase    Standing Status:   Future    Number of Occurrences:   1    Standing Expiration Date:   04/18/2024   Haptoglobin    Standing Status:   Future    Number of Occurrences:   1    Standing Expiration Date:   04/18/2024   Reticulocytes    Standing Status:   Future    Standing Expiration Date:   04/18/2024   Vitamin B12    Standing Status:   Future    Standing Expiration Date:   04/18/2024    Folate    Standing Status:   Future    Standing Expiration Date:   04/18/2024   Ambulatory referral to Gastroenterology    Referral Priority:   Urgent    Referral Type:   Consultation    Referral Reason:   Specialty Services Required    Number of Visits Requested:   1   Type and screen    Standing Status:   Future    Number of Occurrences:   1    Standing Expiration Date:   04/18/2024   RTC in 1 month for MD visit, labs, cycle 9 of Durvalumab  The total time spent in the appointment was 30 minutes encounter with patients including review of chart and various tests results, discussions about plan of care and coordination of care plan   All questions were answered. The patient knows to call the  clinic with any problems, questions or concerns. No barriers to learning was detected.  Michaelyn Barter, MD 8/27/20242:15 PM   HISTORY OF PRESENTING ILLNESS:  Derek Webb 72 y.o. 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  Patient was seen today accompanied by [name].  Denies any cough, hemoptysis, shortness of breath and weight loss. Remote smoker.  Quit in 2013. Has family history of lung cancer in father.  INTERVAL HISTORY-  Patient seen today prior to cycle 9 of Durvalumab.  Accompanied by brother. He is tolerating treatments well. Patient denies fever, chills, nausea, vomiting, shortness of breath, cough, abdominal pain, bleeding, bowel or bladder issues. Energy level is good.  Appetite is good.  Denies any weight loss. Denies pain.   I have reviewed his chart and materials related to his cancer extensively and collaborated history with the patient. Summary of oncologic history is as follows: Oncology History Overview Note  History of left upper lobe lung SCC status  post microwave thermal ablation in 2013 by Dr. Fredia Sorrow.  He was deemed not a surgical candidate and had transportation issues for radiation treatment.   Squamous cell lung cancer (HCC)  01/12/2022 Initial Diagnosis   Patient has been following with Dr. Welton Flakes of pulmonary for suspicious lung nodule. CT chest in March 2021 showed post ablation scarring in LUL not changed and increased atelectasis of RML and RUL. He did not follow through.   CT chest in 01/12/2022 showed  New abrupt and slightly irregular cut off of the right upper lobe bronchus with associated right upper lobe atelectasis and postobstructive changes. Findings are concerning for endobronchial malignancy    04/08/2022 PET scan   IMPRESSION: 1. Hypermetabolic central right upper lobe lung mass nearly occluding the right upper lobe bronchus, poorly delineated on the noncontrast CT images, measuring approximately 3.8 x 2.9 cm, compatible with primary bronchogenic malignancy. 2. Postobstructive pneumonia/atelectasis throughout the basilar right upper lobe. 3. No hypermetabolic metastatic thoracic adenopathy or distant metastatic disease. 4. Stable mild post ablation change in the left upper lobe with no metabolic evidence of local tumor recurrence in this location. 5. Dilated 4.4 cm ascending thoracic aorta. Recommend annual imaging followup by CTA or MRA   05/12/2022 Procedure   Procedure was delayed due to miscommunication about holding Plavix. S/p bronchoscopy with EBUS by Dr. Jayme Cloud. Op note mentions significant bulky adenopathy in the subcarinal space and in the right hilum.    05/12/2022 Pathology Results   DIAGNOSIS:  A.  LUNG, RIGHT UPPER LOBE; ENB-ASSISTED BIOPSY:  - SQUAMOUS CELL CARCINOMA.   LUNG, RIGHT UPPER LOBE; EBUS-ASSISTED BRUSHING:  - POSITIVE FOR MALIGNANCY.  - SQUAMOUS CELL CARCINOMA IS PRESENT.   LUNG, RIGHT UPPER LOBE; EBUS-ASSISTED LAVAGE:  - POSITIVE FOR MALIGNANCY.  - SQUAMOUS CELL CARCINOMA  IS PRESENT  SUBCARINAL SPACE, RIGHT; EBUS-ASSISTED FNA:  - POSITIVE FOR MALIGNANCY.  - SQUAMOUS CELL CARCINOMA IS PRESENT   05/18/2022 Cancer Staging   Staging form: Lung, AJCC 7th Edition - Clinical: Stage IIIA (T4, N0, M0) - Signed by Michaelyn Barter, MD on 06/03/2022   05/21/2022 Imaging   MRI brain  No evidence of metastatic disease in the brain. 2. Small enhancing foci in the right parietal and right occipital calvarium, which are indeterminate but could represent small osseous metastases. Attention on follow-up   06/03/2022 -  07/27/2022 Chemotherapy   Patient is on Treatment Plan : LUNG Carboplatin + Paclitaxel + XRT q7d      08/03/2022 -  Chemotherapy   Patient is on Treatment Plan : LUNG NSCLC Durvalumab (1500) q28d        MEDICAL HISTORY:  Past Medical History:  Diagnosis Date   Alcoholism (HCC)    COPD (chronic obstructive pulmonary disease) (HCC)    SEVERE COPD -OCCASIONALLY USES OXYGEN AT NIGHT-DOES NOT USE INHALERS ON REGULAR BASIS   Elevated cholesterol    GERD (gastroesophageal reflux disease)    HOH (hard of hearing)    Hypertension    Lung cancer (HCC)    LEFT UPPER LUNG CARCINOMA-NOT A CANDIDATE FOR SURGICAL RESECTION BECAUSE OF HIS COPD AND POOR CANDIDATE FOR RADIATION BECAUSE OF TRANSPORTATION PROBLEMS   Seizures (HCC)    CHRONIC DILATIN - PT STATES NO SEIZURES IN PAST COUPLE OF YEARS; PT STATES HIS SEIZURES CAUSED NUMBNESS OF ARMS AND HANDS AND NOT ABLE TO MOVE HIS ARMS   Stroke (HCC)    2 TO 3 YRS AGO-AFFECTED HIS SPEECH--ABLE TO AMBULATE WITHOUT ASSIST AND DOES YARD WORK.  DECREASED HEARING IN BOTH EARS SINCE STROKE.   Vitamin D deficiency     SURGICAL HISTORY: Past Surgical History:  Procedure Laterality Date   CAROTID SURGERY 2009 -LEFT     COLON POLYP EXCISION     COLON SURGERY     COLONOSCOPY WITH PROPOFOL N/A 04/18/2017   Procedure: COLONOSCOPY WITH PROPOFOL;  Surgeon: Scot Jun, MD;  Location[facility] ENDOSCOPY;  Service: Endoscopy;   Laterality: N/A;   ESOPHAGOGASTRODUODENOSCOPY     IR GENERIC HISTORICAL  01/21/2016   IR RADIOLOGIST EVAL & MGMT 01/21/2016 Irish Lack, MD [facility] INTERV RAD   IR GENERIC HISTORICAL  10/22/2014   IR RADIOLOGIST EVAL & MGMT 10/22/2014 Irish Lack, MD [facility] INTERV RAD   IR IMAGING GUIDED PORT INSERTION  05/28/2022   IR RADIOLOGIST EVAL & MGMT  02/02/2017   IR RADIOLOGIST EVAL & MGMT  03/08/2018   percutaneous biopsy     VIDEO BRONCHOSCOPY WITH ENDOBRONCHIAL ULTRASOUND Right 05/12/2022   Procedure: VIDEO BRONCHOSCOPY WITH ENDOBRONCHIAL ULTRASOUND;  Surgeon: Salena Saner, MD;  Location: [facility] ORS;  Service: Cardiopulmonary;  Laterality: Right;    SOCIAL HISTORY: Social History   Socioeconomic History   Marital status: Single    Spouse name: Not on file   Number of children: Not on file   Years of education: Not on file   Highest education level: Not on file  Occupational History   Not on file  Tobacco Use   Smoking status: Former    Current packs/day: 0.00    Average packs/day: 1 pack/day for 35.0 years (35.0 ttl pk-yrs)    Types: Cigarettes    Start date: 12/21/1976    Quit date: 12/22/2011    Years since quitting: 11.3   Smokeless tobacco: Never  Vaping Use   Vaping status: Never Used  Substance and Sexual Activity   Alcohol use: Yes    Comment: occosionally    Drug use: No   Sexual activity: Not on file  Other Topics Concern   Not on file  Social History Narrative   Not on file   Social Determinants of Health   Financial Resource Strain: Low Risk  (11/28/2020)   Overall Financial Resource Strain (CARDIA)    Difficulty of Paying Living Expenses: Not hard at all  Food Insecurity: No Food Insecurity (06/15/2022)   Hunger Vital Sign  Worried About Programme researcher, broadcasting/film/video in the Last Year: Never true    Ran Out of Food in the Last Year: Never true  Transportation Needs: No Transportation Needs (06/15/2022)   PRAPARE - Administrator, Civil Service (Medical): No     Lack of Transportation (Non-Medical): No  Physical Activity: Inactive (06/15/2022)   Exercise Vital Sign    Days of Exercise per Week: 0 days    Minutes of Exercise per Session: 0 min  Stress: Stress Concern Present (06/15/2022)   [employer] of Occupational Health - Occupational Stress Questionnaire    Feeling of Stress : To some extent  Social Connections: Socially Isolated (06/15/2022)   Social Connection and Isolation Panel [NHANES]    Frequency of Communication with Friends and Family: More than three times a week    Frequency of Social Gatherings with Friends and Family: More than three times a week    Attends Religious Services: Never    Database administrator or Organizations: No    Attends Banker Meetings: Never    Marital Status: Never married  Intimate Partner Violence: Unknown (06/15/2022)   Humiliation, Afraid, Rape, and Kick questionnaire    Fear of Current or Ex-Partner: No    Emotionally Abused: Not on file    Physically Abused: No    Sexually Abused: No    FAMILY HISTORY: Family History  Problem Relation Age of Onset   Colon polyps Sister     ALLERGIES:  has No Known Allergies.  MEDICATIONS:  Current Outpatient Medications  Medication Sig Dispense Refill   aspirin 81 MG tablet Take 81 mg by mouth daily.     atorvastatin (LIPITOR) 80 MG tablet Take 1 tablet (80 mg total) by mouth daily. 90 tablet 3   budesonide-formoterol (SYMBICORT) 160-4.5 MCG/ACT inhaler Inhale 1 puff into the lungs 2 (two) times daily. 1 each 5   Cholecalciferol (VITAMIN D) 2000 UNITS CAPS Take by mouth. VITAMIN D 3 2000 IU  ONE SOFTGEL DAILY  --OTC     clopidogrel (PLAVIX) 75 MG tablet Take 1 tablet (75 mg total) by mouth daily. 90 tablet 3   enalapril (VASOTEC) 20 MG tablet Take 1 tablet (20 mg total) by mouth daily. 90 tablet 3   ezetimibe (ZETIA) 10 MG tablet Take 1 tablet (10 mg total) by mouth daily. 90 tablet 3   Na Sulfate-K Sulfate-Mg Sulf 17.5-3.13-1.6  GM/177ML SOLN Take by mouth.     OXYGEN Inhale into the lungs. At night     pantoprazole (PROTONIX) 40 MG tablet Take 1 tablet (40 mg total) by mouth daily. IN AM 90 tablet 2   phenytoin (DILANTIN) 100 MG ER capsule Take 4 capsules (400 mg total) by mouth at bedtime. 360 capsule 0   No current facility-administered medications for this visit.    REVIEW OF SYSTEMS:   Pertinent information mentioned in HPI All other systems were reviewed with the patient and are negative.  PHYSICAL EXAMINATION: ECOG PERFORMANCE STATUS: 2 - Symptomatic, <50% confined to bed  Vitals:   04/19/23 1010  BP: 118/72  Pulse: 76  Temp: 98.7 F (37.1 C)  SpO2: 100%      Filed Weights   04/19/23 1010  Weight: 187 lb (84.8 kg)        GENERAL:alert, no distress and comfortable SKIN: skin color, texture, turgor are normal, no rashes or significant lesions EYES: normal, conjunctiva are pink and non-injected, sclera clear OROPHARYNX:no exudate, no erythema and lips, buccal mucosa,  and tongue normal  NECK: supple, thyroid normal size, non-tender, without nodularity LYMPH:  no palpable lymphadenopathy in the cervical, axillary or inguinal LUNGS: clear to auscultation and percussion with normal breathing effort HEART: regular rate & rhythm and no murmurs and no lower extremity edema ABDOMEN:abdomen soft, non-tender and normal bowel sounds Musculoskeletal:no cyanosis of digits and no clubbing  PSYCH: alert & oriented x 3 with fluent speech NEURO: no focal motor/sensory deficits  LABORATORY DATA:  I have reviewed the data as listed Lab Results  Component Value Date   WBC 5.3 04/19/2023   HGB 7.6 (L) 04/19/2023   HCT 23.0 (L) 04/19/2023   MCV 92.0 04/19/2023   PLT 342 04/19/2023   Recent Labs    02/22/23 0831 03/22/23 0919 04/19/23 0944  NA 135 133* 137  K 3.8 3.9 3.6  CL 102 103 106  CO2 25 22 25   GLUCOSE 101* 88 98  BUN 12 11 12   CREATININE 0.65 0.58* 0.52*  CALCIUM 9.2 8.7* 8.3*   GFRNONAA >60 >60 >60  PROT 7.9 7.0 6.6  ALBUMIN 3.7 3.4* 3.2*  AST 18 17 15   ALT 19 16 12   ALKPHOS 109 89 89  BILITOT 0.3 0.4 0.3    RADIOGRAPHIC STUDIES: I have personally reviewed the radiological images as listed and agreed with the findings in the report. No results found.

## 2023-04-19 NOTE — Progress Notes (Signed)
Patient states that he is doing well, and he has no new questions or  concerns for Dr. Alena Bills today.

## 2023-04-19 NOTE — Patient Instructions (Signed)
[facility] [place] [facility]  Discharge Instructions: Thank you for choosing [facility] to provide your oncology and hematology care.  If you have a lab appointment with the Cancer Center, please go directly to the Cancer Center and check in at the registration area.  Wear comfortable clothing and clothing appropriate for easy access to any Portacath or PICC line.   We strive to give you quality time with your provider. You may need to reschedule your appointment if you arrive late (15 or more minutes).  Arriving late affects you and other patients whose appointments are after yours.  Also, if you miss three or more appointments without notifying the office, you may be dismissed from the clinic at the provider's discretion.      For prescription refill requests, have your pharmacy contact our office and allow 72 hours for refills to be completed.    Today you received the following chemotherapy and/or immunotherapy agents Imfinzi        To help prevent nausea and vomiting after your treatment, we encourage you to take your nausea medication as directed.  BELOW ARE SYMPTOMS THAT SHOULD BE REPORTED IMMEDIATELY: *FEVER GREATER THAN 100.4 F (38 C) OR HIGHER *CHILLS OR SWEATING *NAUSEA AND VOMITING THAT IS NOT CONTROLLED WITH YOUR NAUSEA MEDICATION *UNUSUAL SHORTNESS OF BREATH *UNUSUAL BRUISING OR BLEEDING *URINARY PROBLEMS (pain or burning when urinating, or frequent urination) *BOWEL PROBLEMS (unusual diarrhea, constipation, pain near the anus) TENDERNESS IN MOUTH AND THROAT WITH OR WITHOUT PRESENCE OF ULCERS (sore throat, sores in mouth, or a toothache) UNUSUAL RASH, SWELLING OR PAIN  UNUSUAL VAGINAL DISCHARGE OR ITCHING   Items with * indicate a potential emergency and should be followed up as soon as possible or go to the Emergency Department if any problems should occur.  Please show the CHEMOTHERAPY ALERT CARD or IMMUNOTHERAPY ALERT CARD at check-in to  the Emergency Department and triage nurse.  Should you have questions after your visit or need to cancel or reschedule your appointment, please contact [facility] [place] [facility]  [phone] and follow the prompts.  Office hours are 8:00 a.m. to 4:30 p.m. Monday - Friday. Please note that voicemails left after 4:00 p.m. may not be returned until the following business day.  We are closed weekends and major holidays. You have access to a nurse at all times for urgent questions. Please call the main number to the clinic [phone] and follow the prompts.  For any non-urgent questions, you may also contact your provider using MyChart. We now offer e-Visits for anyone 18 and older to request care online for non-urgent symptoms. For details visit [email or web address].   Also download the MyChart app! Go to the app store, search "MyChart", open the app, select [place], and log in with your MyChart username and password.    

## 2023-04-20 ENCOUNTER — Encounter: Payer: Self-pay | Admitting: Internal Medicine

## 2023-04-20 ENCOUNTER — Ambulatory Visit: Admission: RE | Admit: 2023-04-20 | Payer: 59 | Source: Ambulatory Visit

## 2023-04-20 LAB — HAPTOGLOBIN: Haptoglobin: 165 mg/dL (ref 32–363)

## 2023-04-20 LAB — T4: T4, Total: 5.1 ug/dL (ref 4.5–12.0)

## 2023-04-21 ENCOUNTER — Other Ambulatory Visit: Payer: Self-pay

## 2023-04-21 ENCOUNTER — Telehealth: Payer: Self-pay | Admitting: Internal Medicine

## 2023-04-21 ENCOUNTER — Inpatient Hospital Stay: Payer: 59

## 2023-04-21 ENCOUNTER — Other Ambulatory Visit: Payer: Self-pay | Admitting: Internal Medicine

## 2023-04-21 ENCOUNTER — Encounter: Payer: Self-pay | Admitting: Internal Medicine

## 2023-04-21 ENCOUNTER — Inpatient Hospital Stay
Admission: EM | Admit: 2023-04-21 | Discharge: 2023-04-29 | DRG: 812 | Disposition: A | Discharge status: Home Health | Payer: 59 | Attending: Internal Medicine | Admitting: Internal Medicine

## 2023-04-21 ENCOUNTER — Emergency Department: Payer: 59

## 2023-04-21 VITALS — BP 112/81 | HR 85 | Temp 97.3°F | Resp 18

## 2023-04-21 DIAGNOSIS — C3412 Malignant neoplasm of upper lobe, left bronchus or lung: Secondary | ICD-10-CM | POA: Diagnosis present

## 2023-04-21 DIAGNOSIS — E278 Other specified disorders of adrenal gland: Secondary | ICD-10-CM | POA: Diagnosis not present

## 2023-04-21 DIAGNOSIS — Z7902 Long term (current) use of antithrombotics/antiplatelets: Secondary | ICD-10-CM

## 2023-04-21 DIAGNOSIS — K5791 Diverticulosis of intestine, part unspecified, without perforation or abscess with bleeding: Secondary | ICD-10-CM | POA: Diagnosis not present

## 2023-04-21 DIAGNOSIS — D62 Acute posthemorrhagic anemia: Principal | ICD-10-CM | POA: Insufficient documentation

## 2023-04-21 DIAGNOSIS — E7800 Pure hypercholesterolemia, unspecified: Secondary | ICD-10-CM | POA: Diagnosis present

## 2023-04-21 DIAGNOSIS — D500 Iron deficiency anemia secondary to blood loss (chronic): Principal | ICD-10-CM

## 2023-04-21 DIAGNOSIS — R569 Unspecified convulsions: Secondary | ICD-10-CM

## 2023-04-21 DIAGNOSIS — Z79899 Other long term (current) drug therapy: Secondary | ICD-10-CM | POA: Diagnosis not present

## 2023-04-21 DIAGNOSIS — Z923 Personal history of irradiation: Secondary | ICD-10-CM | POA: Diagnosis not present

## 2023-04-21 DIAGNOSIS — D649 Anemia, unspecified: Secondary | ICD-10-CM | POA: Diagnosis present

## 2023-04-21 DIAGNOSIS — K2970 Gastritis, unspecified, without bleeding: Secondary | ICD-10-CM | POA: Diagnosis present

## 2023-04-21 DIAGNOSIS — I7121 Aneurysm of the ascending aorta, without rupture: Secondary | ICD-10-CM | POA: Diagnosis present

## 2023-04-21 DIAGNOSIS — H9193 Unspecified hearing loss, bilateral: Secondary | ICD-10-CM | POA: Diagnosis present

## 2023-04-21 DIAGNOSIS — I639 Cerebral infarction, unspecified: Secondary | ICD-10-CM | POA: Diagnosis not present

## 2023-04-21 DIAGNOSIS — J449 Chronic obstructive pulmonary disease, unspecified: Secondary | ICD-10-CM | POA: Diagnosis not present

## 2023-04-21 DIAGNOSIS — K640 First degree hemorrhoids: Secondary | ICD-10-CM | POA: Diagnosis present

## 2023-04-21 DIAGNOSIS — D125 Benign neoplasm of sigmoid colon: Secondary | ICD-10-CM | POA: Diagnosis not present

## 2023-04-21 DIAGNOSIS — D120 Benign neoplasm of cecum: Secondary | ICD-10-CM | POA: Diagnosis present

## 2023-04-21 DIAGNOSIS — K449 Diaphragmatic hernia without obstruction or gangrene: Secondary | ICD-10-CM | POA: Diagnosis present

## 2023-04-21 DIAGNOSIS — K5730 Diverticulosis of large intestine without perforation or abscess without bleeding: Secondary | ICD-10-CM | POA: Diagnosis present

## 2023-04-21 DIAGNOSIS — D509 Iron deficiency anemia, unspecified: Secondary | ICD-10-CM | POA: Diagnosis not present

## 2023-04-21 DIAGNOSIS — D122 Benign neoplasm of ascending colon: Secondary | ICD-10-CM | POA: Diagnosis not present

## 2023-04-21 DIAGNOSIS — C3411 Malignant neoplasm of upper lobe, right bronchus or lung: Secondary | ICD-10-CM | POA: Diagnosis not present

## 2023-04-21 DIAGNOSIS — K200 Eosinophilic esophagitis: Secondary | ICD-10-CM | POA: Diagnosis not present

## 2023-04-21 DIAGNOSIS — I6310 Cerebral infarction due to embolism of unspecified precerebral artery: Secondary | ICD-10-CM | POA: Diagnosis not present

## 2023-04-21 DIAGNOSIS — Z83719 Family history of colon polyps, unspecified: Secondary | ICD-10-CM

## 2023-04-21 DIAGNOSIS — I69398 Other sequelae of cerebral infarction: Secondary | ICD-10-CM | POA: Diagnosis not present

## 2023-04-21 DIAGNOSIS — K635 Polyp of colon: Secondary | ICD-10-CM | POA: Diagnosis not present

## 2023-04-21 DIAGNOSIS — Z5112 Encounter for antineoplastic immunotherapy: Secondary | ICD-10-CM | POA: Diagnosis not present

## 2023-04-21 DIAGNOSIS — Z7982 Long term (current) use of aspirin: Secondary | ICD-10-CM | POA: Diagnosis not present

## 2023-04-21 DIAGNOSIS — R410 Disorientation, unspecified: Secondary | ICD-10-CM | POA: Diagnosis not present

## 2023-04-21 DIAGNOSIS — K649 Unspecified hemorrhoids: Secondary | ICD-10-CM | POA: Diagnosis not present

## 2023-04-21 DIAGNOSIS — I77819 Aortic ectasia, unspecified site: Secondary | ICD-10-CM | POA: Diagnosis not present

## 2023-04-21 DIAGNOSIS — C3490 Malignant neoplasm of unspecified part of unspecified bronchus or lung: Secondary | ICD-10-CM | POA: Diagnosis not present

## 2023-04-21 DIAGNOSIS — Z7951 Long term (current) use of inhaled steroids: Secondary | ICD-10-CM

## 2023-04-21 DIAGNOSIS — J440 Chronic obstructive pulmonary disease with acute lower respiratory infection: Secondary | ICD-10-CM | POA: Diagnosis not present

## 2023-04-21 DIAGNOSIS — E611 Iron deficiency: Secondary | ICD-10-CM | POA: Diagnosis not present

## 2023-04-21 DIAGNOSIS — D124 Benign neoplasm of descending colon: Secondary | ICD-10-CM | POA: Diagnosis not present

## 2023-04-21 DIAGNOSIS — Z8673 Personal history of transient ischemic attack (TIA), and cerebral infarction without residual deficits: Secondary | ICD-10-CM | POA: Diagnosis not present

## 2023-04-21 DIAGNOSIS — D72819 Decreased white blood cell count, unspecified: Secondary | ICD-10-CM | POA: Diagnosis not present

## 2023-04-21 DIAGNOSIS — C3491 Malignant neoplasm of unspecified part of right bronchus or lung: Secondary | ICD-10-CM

## 2023-04-21 DIAGNOSIS — E876 Hypokalemia: Secondary | ICD-10-CM | POA: Diagnosis not present

## 2023-04-21 DIAGNOSIS — I10 Essential (primary) hypertension: Secondary | ICD-10-CM | POA: Diagnosis present

## 2023-04-21 DIAGNOSIS — E538 Deficiency of other specified B group vitamins: Secondary | ICD-10-CM | POA: Diagnosis present

## 2023-04-21 DIAGNOSIS — K219 Gastro-esophageal reflux disease without esophagitis: Secondary | ICD-10-CM | POA: Diagnosis present

## 2023-04-21 DIAGNOSIS — Z87891 Personal history of nicotine dependence: Secondary | ICD-10-CM | POA: Diagnosis not present

## 2023-04-21 DIAGNOSIS — K3189 Other diseases of stomach and duodenum: Secondary | ICD-10-CM | POA: Diagnosis not present

## 2023-04-21 DIAGNOSIS — K2100 Gastro-esophageal reflux disease with esophagitis, without bleeding: Secondary | ICD-10-CM | POA: Diagnosis present

## 2023-04-21 DIAGNOSIS — K5790 Diverticulosis of intestine, part unspecified, without perforation or abscess without bleeding: Secondary | ICD-10-CM | POA: Diagnosis not present

## 2023-04-21 DIAGNOSIS — R932 Abnormal findings on diagnostic imaging of liver and biliary tract: Secondary | ICD-10-CM | POA: Diagnosis not present

## 2023-04-21 DIAGNOSIS — K6389 Other specified diseases of intestine: Secondary | ICD-10-CM | POA: Diagnosis not present

## 2023-04-21 DIAGNOSIS — Z86010 Personal history of colonic polyps: Secondary | ICD-10-CM

## 2023-04-21 DIAGNOSIS — R935 Abnormal findings on diagnostic imaging of other abdominal regions, including retroperitoneum: Secondary | ICD-10-CM | POA: Diagnosis not present

## 2023-04-21 DIAGNOSIS — Z8719 Personal history of other diseases of the digestive system: Secondary | ICD-10-CM

## 2023-04-21 LAB — CBC
HCT: 20.6 % — ABNORMAL LOW (ref 39.0–52.0)
Hemoglobin: 6.9 g/dL — ABNORMAL LOW (ref 13.0–17.0)
MCH: 31.4 pg (ref 26.0–34.0)
MCHC: 33.5 g/dL (ref 30.0–36.0)
MCV: 93.6 fL (ref 80.0–100.0)
Platelets: 405 10*3/uL — ABNORMAL HIGH (ref 150–400)
RBC: 2.2 MIL/uL — ABNORMAL LOW (ref 4.22–5.81)
RDW: 14.6 % (ref 11.5–15.5)
WBC: 5.7 10*3/uL (ref 4.0–10.5)
nRBC: 0 % (ref 0.0–0.2)

## 2023-04-21 LAB — BASIC METABOLIC PANEL
Anion gap: 7 (ref 5–15)
BUN: 11 mg/dL (ref 8–23)
CO2: 25 mmol/L (ref 22–32)
Calcium: 8.4 mg/dL — ABNORMAL LOW (ref 8.9–10.3)
Chloride: 103 mmol/L (ref 98–111)
Creatinine, Ser: 0.7 mg/dL (ref 0.61–1.24)
GFR, Estimated: >60 mL/min (ref >60)
Glucose, Bld: 112 mg/dL — ABNORMAL HIGH (ref 70–99)
Potassium: 3.9 mmol/L (ref 3.5–5.1)
Sodium: 135 mmol/L (ref 135–145)

## 2023-04-21 LAB — CBC WITH DIFFERENTIAL (CANCER CENTER ONLY)
Abs Immature Granulocytes: 0.04 10*3/uL (ref 0.00–0.07)
Basophils Absolute: 0.1 10*3/uL (ref 0.0–0.1)
Basophils Relative: 1 %
Eosinophils Absolute: 0.2 10*3/uL (ref 0.0–0.5)
Eosinophils Relative: 4 %
HCT: 20.1 % — ABNORMAL LOW (ref 39.0–52.0)
Hemoglobin: 6.7 g/dL — CL (ref 13.0–17.0)
Immature Granulocytes: 1 %
Lymphocytes Relative: 24 %
Lymphs Abs: 1.3 10*3/uL (ref 0.7–4.0)
MCH: 30.9 pg (ref 26.0–34.0)
MCHC: 33.3 g/dL (ref 30.0–36.0)
MCV: 92.6 fL (ref 80.0–100.0)
Monocytes Absolute: 0.7 10*3/uL (ref 0.1–1.0)
Monocytes Relative: 13 %
Neutro Abs: 3.3 10*3/uL (ref 1.7–7.7)
Neutrophils Relative %: 57 %
Platelet Count: 404 10*3/uL — ABNORMAL HIGH (ref 150–400)
RBC: 2.17 MIL/uL — ABNORMAL LOW (ref 4.22–5.81)
RDW: 14.6 % (ref 11.5–15.5)
WBC Count: 5.7 10*3/uL (ref 4.0–10.5)
nRBC: 0 % (ref 0.0–0.2)

## 2023-04-21 LAB — RETICULOCYTES
Immature Retic Fract: 32.6 % — ABNORMAL HIGH (ref 2.3–15.9)
RBC.: 2.14 MIL/uL — ABNORMAL LOW (ref 4.22–5.81)
Retic Count, Absolute: 69.1 10*3/uL (ref 19.0–186.0)
Retic Ct Pct: 3.2 % — ABNORMAL HIGH (ref 0.4–3.1)

## 2023-04-21 LAB — HEPATIC FUNCTION PANEL
ALT: 12 U/L (ref 0–44)
AST: 13 U/L — ABNORMAL LOW (ref 15–41)
Albumin: 3.2 g/dL — ABNORMAL LOW (ref 3.5–5.0)
Alkaline Phosphatase: 93 U/L (ref 38–126)
Bilirubin, Direct: <0.1 mg/dL (ref 0.0–0.2)
Total Bilirubin: 0.6 mg/dL (ref 0.3–1.2)
Total Protein: 6.5 g/dL (ref 6.5–8.1)

## 2023-04-21 LAB — PROTIME-INR
INR: 1.2 (ref 0.8–1.2)
Prothrombin Time: 15 seconds (ref 11.4–15.2)

## 2023-04-21 LAB — HEMOGLOBIN AND HEMATOCRIT, BLOOD
HCT: 22.9 % — ABNORMAL LOW (ref 39.0–52.0)
Hemoglobin: 7.8 g/dL — ABNORMAL LOW (ref 13.0–17.0)

## 2023-04-21 LAB — VITAMIN B12: Vitamin B-12: 373 pg/mL (ref 180–914)

## 2023-04-21 LAB — PREPARE RBC (CROSSMATCH)

## 2023-04-21 LAB — FOLATE: Folate: 22 ng/mL (ref >5.9)

## 2023-04-21 MED ORDER — ACETAMINOPHEN 650 MG RE SUPP: 650.0000 mg | Freq: Four times a day (QID) | RECTAL | Status: DC | PRN

## 2023-04-21 MED ORDER — SODIUM CHLORIDE 0.9 % IV SOLN
200.0000 mg | Freq: Once | INTRAVENOUS | Status: AC
  Administered 2023-04-21: 200 mg via INTRAVENOUS
  Filled 2023-04-21: qty 200

## 2023-04-21 MED ORDER — MORPHINE SULFATE (PF) 2 MG/ML IV SOLN: 2.0000 mg | INTRAVENOUS | Status: DC | PRN

## 2023-04-21 MED ORDER — LACTATED RINGERS IV SOLN: INTRAVENOUS | Status: DC

## 2023-04-21 MED ORDER — ONDANSETRON HCL 4 MG PO TABS: 4.0000 mg | ORAL_TABLET | Freq: Four times a day (QID) | ORAL | Status: DC | PRN

## 2023-04-21 MED ORDER — ONDANSETRON HCL 4 MG/2ML IJ SOLN: 4.0000 mg | Freq: Four times a day (QID) | INTRAMUSCULAR | Status: DC | PRN

## 2023-04-21 MED ORDER — PANTOPRAZOLE SODIUM 40 MG IV SOLR
40.0000 mg | Freq: Once | INTRAVENOUS | Status: AC
  Administered 2023-04-21: 40 mg via INTRAVENOUS
  Filled 2023-04-21: qty 10

## 2023-04-21 MED ORDER — HYDRALAZINE HCL 20 MG/ML IJ SOLN: 5.0000 mg | INTRAMUSCULAR | Status: DC | PRN

## 2023-04-21 MED ORDER — HEPARIN SOD (PORK) LOCK FLUSH 100 UNIT/ML IV SOLN
500.0000 [IU] | Freq: Once | INTRAVENOUS | Status: AC | PRN
  Administered 2023-04-21: 500 [IU]
  Filled 2023-04-21: qty 5

## 2023-04-21 MED ORDER — IOHEXOL 350 MG/ML SOLN
100.0000 mL | Freq: Once | INTRAVENOUS | Status: AC | PRN
  Administered 2023-04-21: 100 mL via INTRAVENOUS

## 2023-04-21 MED ORDER — ACETAMINOPHEN 325 MG PO TABS
650.0000 mg | ORAL_TABLET | Freq: Four times a day (QID) | ORAL | Status: DC | PRN
  Administered 2023-04-24 – 2023-04-27 (×2): 650 mg via ORAL
  Filled 2023-04-21 (×2): qty 2

## 2023-04-21 MED ORDER — SODIUM CHLORIDE 0.9 % IV SOLN
10.0000 mL/h | Freq: Once | INTRAVENOUS | Status: AC
  Administered 2023-04-21: 10 mL/h via INTRAVENOUS

## 2023-04-21 MED ORDER — SODIUM CHLORIDE 0.9 % IV SOLN
Freq: Once | INTRAVENOUS | Status: AC
  Filled 2023-04-21: qty 250

## 2023-04-21 NOTE — Assessment & Plan Note (Signed)
History of CVA patient currently on aspirin which we will hold will continue patient's Zetia and atorvastatin.  N.p.o. after midnight otherwise.  GI evaluation per a.m. team.

## 2023-04-21 NOTE — Assessment & Plan Note (Signed)
Stable per chart review on PET scan.  Patient currently on maintenance regimen.

## 2023-04-21 NOTE — Assessment & Plan Note (Addendum)
Suspect 2/2 to chronic blood loss as pt is on plavix now held.  Type/ cross in ed. Gi consulted.  CTA abd gib study negative for any active bleed. Pt f/u with Hematology and will defer EP / BM BX . Repeat hb of 7.8 after one unit.     Latest Ref Rng & Units 04/21/2023   10:38 PM 04/21/2023    4:26 PM 04/21/2023    1:23 PM  CBC  WBC 4.0 - 10.5 K/uL  5.7  5.7   Hemoglobin 13.0 - 17.0 g/dL 7.8  6.9  6.7   Hematocrit 39.0 - 52.0 % 22.9  20.6  20.1   Platelets 150 - 400 K/uL  405  404

## 2023-04-21 NOTE — ED Triage Notes (Signed)
Sent to ED from cancer center.  Concern for GI bleeding.  Has received Iron infusion this week, drop in HGB from 11 to today of 6.7

## 2023-04-21 NOTE — Assessment & Plan Note (Signed)
Vitals:   04/21/23 1623 04/21/23 1753 04/21/23 1758 04/21/23 1800  BP: 109/73 110/77 110/77 117/78   04/21/23 1815 04/21/23 1827 04/21/23 1828 04/21/23 1830  BP: 103/62 117/78 103/62 105/73   04/21/23 1845 04/21/23 1900  BP: (!) 104/52 98/66  Blood pressures are currently soft we will currently hold patient's enalapril 20 mg and monitor.

## 2023-04-21 NOTE — H&P (Signed)
History and Physical    Patient: Derek Webb ZOX:[id number] DOB: 1950/12/04 DOA: 04/21/2023 DOS: the patient was seen and examined on 04/22/2023 PCP: Carlean Jews, PA-C  Patient coming from:  Cancer center   Chief Complaint:  Chief Complaint  Patient presents with   Abnormal Lab    HPI: [name] is a 72 y.o. male with medical history significant for alcohol abuse, lung cancer seizures, history of stroke presenting with abnormal blood work specifically hemoglobin drop from 11-6.7 and per Dr. Lemar Lofty note concerns of GI bleed patient has GI appointment that is scheduled for 4 weeks out and patient was advised to go to the emergency room urgently.  Patient has a history of stage III squamous cell lung cancer and has underwent treatment with radiation and now on maintenance regimen Maintenance Durvalumab started 08/13/2022. Plan total 12 cycles. with by oncology clinic patient had a PET scan which showed no recurrent cancer, hypermetabolic incidental left prostate nodule which she was seen by Dr. Morrie Sheldon with urology and per note low suspicion for prostate cancer.  Patient to follow-up with urology in 1 year please refer to note for additional details. Chart review shows pt has Dilated 4.4 cm ascending thoracic aorta. Recommend annual imaging followup by CTA or MRA - per pet scan on 03/2022.  In Ed pt is alert/awake and stable asymptomatic without any c/o melena or GIB or BRBPR.  Vitals remained stable.  Stool guaiac is pending. After chart review due to high risk of patient having acute blood loss anemia from either chronic or acute bleeding and acute drop a stat CT angio GI bleed was ordered which was negative for any evidence of GI hemorrhage, please refer to complete report Labs show BMP shows glucose 112 calcium 8.4 hepatic function added and pending.  CBC shows white count of 5.7 hemoglobin of 6.9 platelets of 405 normal RDW and MCV of 93.6 reticulocyte count of 3.2.  Type and  cross ordered in the emergency room for transfusion.  Review of Systems: Review of Systems  Unable to perform ROS: Age   Past Medical History:  Diagnosis Date   Alcoholism (HCC)    COPD (chronic obstructive pulmonary disease) (HCC)    SEVERE COPD -OCCASIONALLY USES OXYGEN AT NIGHT-DOES NOT USE INHALERS ON REGULAR BASIS   Elevated cholesterol    GERD (gastroesophageal reflux disease)    HOH (hard of hearing)    Hypertension    Lung cancer (HCC)    LEFT UPPER LUNG CARCINOMA-NOT A CANDIDATE FOR SURGICAL RESECTION BECAUSE OF HIS COPD AND POOR CANDIDATE FOR RADIATION BECAUSE OF TRANSPORTATION PROBLEMS   Seizures (HCC)    CHRONIC DILATIN - PT STATES NO SEIZURES IN PAST COUPLE OF YEARS; PT STATES HIS SEIZURES CAUSED NUMBNESS OF ARMS AND HANDS AND NOT ABLE TO MOVE HIS ARMS   Stroke (HCC)    2 TO 3 YRS AGO-AFFECTED HIS SPEECH--ABLE TO AMBULATE WITHOUT ASSIST AND DOES YARD WORK.  DECREASED HEARING IN BOTH EARS SINCE STROKE.   Vitamin D deficiency    Past Surgical History:  Procedure Laterality Date   CAROTID SURGERY 2009 -LEFT     COLON POLYP EXCISION     COLON SURGERY     COLONOSCOPY WITH PROPOFOL N/A 04/18/2017   Procedure: COLONOSCOPY WITH PROPOFOL;  Surgeon: Scot Jun, MD;  Location: [facility]COPY;  Service: Endoscopy;  Laterality: N/A;   ESOPHAGOGASTRODUODENOSCOPY     IR GENERIC HISTORICAL  01/21/2016   IR RADIOLOGIST EVAL & MGMT 01/21/2016 Irish Lack,  MD [facility] INTERV RAD   IR GENERIC HISTORICAL  10/22/2014   IR RADIOLOGIST EVAL & MGMT 10/22/2014 Irish Lack, MD [facility] INTERV RAD   IR IMAGING GUIDED PORT INSERTION  05/28/2022   IR RADIOLOGIST EVAL & MGMT  02/02/2017   IR RADIOLOGIST EVAL & MGMT  03/08/2018   percutaneous biopsy     VIDEO BRONCHOSCOPY WITH ENDOBRONCHIAL ULTRASOUND Right 05/12/2022   Procedure: VIDEO BRONCHOSCOPY WITH ENDOBRONCHIAL ULTRASOUND;  Surgeon: Salena Saner, MD;  Location: [facility] ORS;  Service: Cardiopulmonary;  Laterality: Right;   Social  History:   reports that he quit smoking about 11 years ago. His smoking use included cigarettes. He started smoking about 46 years ago. He has a 35 pack-year smoking history. He has never used smokeless tobacco. He reports current alcohol use. He reports that he does not use drugs.  No Known Allergies  Family History  Problem Relation Age of Onset   Colon polyps Sister     Prior to Admission medications   Medication Sig Start Date End Date Taking? Authorizing Provider  aspirin 81 MG tablet Take 81 mg by mouth daily.    [provider]  atorvastatin (LIPITOR) 80 MG tablet Take 1 tablet (80 mg total) by mouth daily. 03/03/23   [name], Salomon Fick, PA-C  budesonide-formoterol (SYMBICORT) 160-4.5 MCG/ACT inhaler Inhale 1 puff into the lungs 2 (two) times daily. 12/20/22   Yevonne Pax, MD  Cholecalciferol (VITAMIN D) 2000 UNITS CAPS Take by mouth. VITAMIN D 3 2000 IU  ONE SOFTGEL DAILY  --OTC    [provider]  clopidogrel (PLAVIX) 75 MG tablet Take 1 tablet (75 mg total) by mouth daily. 03/03/23   [name], Lauren K, PA-C  enalapril (VASOTEC) 20 MG tablet Take 1 tablet (20 mg total) by mouth daily. 03/03/23   [name], Salomon Fick, PA-C  ezetimibe (ZETIA) 10 MG tablet Take 1 tablet (10 mg total) by mouth daily. 03/03/23   [name], Salomon Fick, PA-C  Na Sulfate-K Sulfate-Mg Sulf 17.5-3.13-1.6 GM/177ML SOLN Take by mouth. 04/22/22   [provider]  OXYGEN Inhale into the lungs. At night    [provider]  pantoprazole (PROTONIX) 40 MG tablet Take 1 tablet (40 mg total) by mouth daily. IN AM 03/03/23   [name], Salomon Fick, PA-C  phenytoin (DILANTIN) 100 MG ER capsule Take 4 capsules (400 mg total) by mouth at bedtime. 03/03/23   [name], Salomon Fick, PA-C     Vitals:   04/21/23 2200 04/21/23 2300 04/22/23 0000 04/22/23 0100  BP: 121/85 122/81 (!) 120/92 115/67  Pulse: 67 77 71 71  Resp: 20 14 16 10   Temp:   98.2 F (36.8 C)   TempSrc:   Oral   SpO2: 99% 100%  100% 99%  Weight:       Physical Exam Vitals and nursing note reviewed.  Constitutional:      General: He is not in acute distress. HENT:     Head: Normocephalic and atraumatic.     Right Ear: Decreased hearing noted.     Left Ear: Decreased hearing noted.     Nose: Nose normal. No nasal deformity.     Mouth/Throat:     Lips: Pink.     Tongue: No lesions.     Pharynx: Oropharynx is clear.  Eyes:     General: Lids are normal.     Extraocular Movements: Extraocular movements intact.  Cardiovascular:     Rate and Rhythm: Normal rate and regular rhythm.  Heart sounds: Normal heart sounds.  Pulmonary:     Effort: Pulmonary effort is normal.     Breath sounds: Normal breath sounds.  Abdominal:     General: Bowel sounds are normal. There is no distension.     Palpations: Abdomen is soft. There is no mass.     Tenderness: There is no abdominal tenderness.  Musculoskeletal:     Right lower leg: No edema.     Left lower leg: No edema.  Skin:    General: Skin is warm.  Neurological:     General: No focal deficit present.     Mental Status: He is alert and oriented to person, place, and time.     Cranial Nerves: Cranial nerves 2-12 are intact.  Psychiatric:        Attention and Perception: Attention normal.        Mood and Affect: Mood normal.        Speech: Speech normal.        Behavior: Behavior normal. Behavior is cooperative.    Labs on Admission: I have personally reviewed following labs and imaging studies  CBC: Recent Labs  Lab 04/19/23 0944 04/19/23 1041 04/21/23 1323 04/21/23 1626 04/21/23 2238  WBC 5.1 5.3 5.7 5.7  --   NEUTROABS 3.5 3.6 3.3  --   --   HGB 7.4* 7.6* 6.7* 6.9* 7.8*  HCT 22.2* 23.0* 20.1* 20.6* 22.9*  MCV 91.4 92.0 92.6 93.6  --   PLT 337 342 404* 405*  --    Basic Metabolic Panel: Recent Labs  Lab 04/19/23 0944 04/21/23 1626  NA 137 135  K 3.6 3.9  CL 106 103  CO2 25 25  GLUCOSE 98 112*  BUN 12 11  CREATININE 0.52* 0.70   CALCIUM 8.3* 8.4*   GFR: Estimated Creatinine Clearance: 89.7 mL/min (by C-G formula based on SCr of 0.7 mg/dL). Liver Function Tests: Recent Labs  Lab 04/19/23 0944 04/21/23 2238  AST 15 13*  ALT 12 12  ALKPHOS 89 93  BILITOT 0.3 0.6  PROT 6.6 6.5  ALBUMIN 3.2* 3.2*   No results for input(s): "LIPASE", "AMYLASE" in the last 168 hours. No results for input(s): "AMMONIA" in the last 168 hours. Coagulation Profile: Recent Labs  Lab 04/21/23 1852  INR 1.2   Cardiac Enzymes: No results for input(s): "CKTOTAL", "CKMB", "CKMBINDEX", "TROPONINI" in the last 168 hours. BNP (last 3 results) No results for input(s): "PROBNP" in the last 8760 hours. HbA1C: No results for input(s): "HGBA1C" in the last 72 hours. CBG: No results for input(s): "GLUCAP" in the last 168 hours. Lipid Profile: No results for input(s): "CHOL", "HDL", "LDLCALC", "TRIG", "CHOLHDL", "LDLDIRECT" in the last 72 hours. Thyroid Function Tests: Recent Labs    04/19/23 0944  TSH 1.378  T4TOTAL 5.1   Anemia Panel: Recent Labs    04/19/23 1041 04/21/23 1223 04/21/23 1323  VITAMINB12  --  373  --   FOLATE  --   --  22.0  FERRITIN 12*  --   --   TIBC 239*  --   --   IRON 29*  --   --   RETICCTPCT  --   --  3.2*   Urinalysis    Component Value Date/Time   APPEARANCEUR Clear 08/31/2021 1454   GLUCOSEU Negative 08/31/2021 1454   BILIRUBINUR Negative 08/31/2021 1454   PROTEINUR Negative 08/31/2021 1454   NITRITE Negative 08/31/2021 1454   LEUKOCYTESUR Negative 08/31/2021 1454    Unresulted Labs (From admission,  onward)     Start     Ordered   04/21/23 2020  HIV Antibody (routine testing w rflx)  (HIV Antibody (Routine testing w reflex) panel)  Once,   R        04/21/23 2021   04/21/23 1843  Occult blood card to lab, stool  Once,   URGENT        04/21/23 1842            Medications  acetaminophen (TYLENOL) tablet 650 mg (has no administration in time range)    Or  acetaminophen  (TYLENOL) suppository 650 mg (has no administration in time range)  morphine (PF) 2 MG/ML injection 2 mg (has no administration in time range)  ondansetron (ZOFRAN) tablet 4 mg (has no administration in time range)    Or  ondansetron (ZOFRAN) injection 4 mg (has no administration in time range)  hydrALAZINE (APRESOLINE) injection 5 mg (has no administration in time range)  lactated ringers infusion ( Intravenous New Bag/Given 04/21/23 2040)  0.9 %  sodium chloride infusion (10 mL/hr Intravenous New Bag/Given 04/21/23 2014)  pantoprazole (PROTONIX) injection 40 mg (40 mg Intravenous Given 04/21/23 1733)  iohexol (OMNIPAQUE) 350 MG/ML injection 100 mL (100 mLs Intravenous Contrast Given 04/21/23 1914)    Radiological Exams on Admission: CT ANGIO GI BLEED  Result Date: 04/21/2023 CLINICAL DATA:  ABLA Concern for GI bleeding. Has received Iron infusion this week, drop in HGB from 11 to today of 6.7. Prior non-small cell lung cancer. EXAM: CTA ABDOMEN AND PELVIS WITHOUT AND WITH CONTRAST TECHNIQUE: Multidetector CT imaging of the abdomen and pelvis was performed using the standard protocol during bolus administration of intravenous contrast. Multiplanar reconstructed images and MIPs were obtained and reviewed to evaluate the vascular anatomy. RADIATION DOSE REDUCTION: This exam was performed according to the departmental dose-optimization program which includes automated exposure control, adjustment of the mA and/or kV according to patient size and/or use of iterative reconstruction technique. CONTRAST:  OMNIPAQUE IOHEXOL 350 MG/ML SOLN COMPARISON:  CT chest 01/19/2023. CT chest 01/26/2017, CT chest 06/27/2012, pet ct 04/08/22 FINDINGS: VASCULAR No extravasation of intravenous contrast noted within the lumen of the bowel. Aorta: Severe atherosclerotic plaque. Normal caliber aorta without aneurysm, dissection, vasculitis or significant stenosis. Celiac: Mild atherosclerotic plaque. Patent without evidence  of aneurysm, dissection, vasculitis or significant stenosis. SMA: Mild atherosclerotic plaque. Patent without evidence of aneurysm, dissection, vasculitis or significant stenosis. Renals: Moderate atherosclerotic plaque. Both renal arteries are patent without evidence of aneurysm, dissection, vasculitis, fibromuscular dysplasia or significant stenosis. IMA: Mild atherosclerotic plaque. Patent without evidence of aneurysm, dissection, vasculitis or significant stenosis. Inflow: Moderate to severe atherosclerotic plaque. Patent without evidence of aneurysm, dissection, vasculitis or significant stenosis. Proximal Outflow:Marland Kitchen Moderate severe atherosclerotic plaque peer bilateral common femoral and visualized portions of the superficial and profunda femoral arteries are patent without evidence of aneurysm, dissection, vasculitis or significant stenosis. Veins: The portal, splenic, superior mesenteric veins are patent. Review of the MIP images confirms the above findings. NON-VASCULAR Lower chest: Interval development of a partially visualized subpleural indeterminate triangular shaped left upper lobe 3 mm pulmonary nodule (6:4). Cardiac changes suggestive of anemia. Hepatobiliary: There is a chronic 2.1 cm left hepatic lobe peripherally discontinuously enhancing mass on articular view that appears to be more centrally filled-in on venous view suggestive of a hepatic hemangioma. No gallstones, gallbladder wall thickening, or pericholecystic fluid. No biliary dilatation. Pancreas: No focal lesion. Normal pancreatic contour. No surrounding inflammatory changes. No main pancreatic ductal dilatation. Spleen:  Normal in size without focal abnormality. Adrenals/Urinary Tract: Chronic 2.2 cm left adrenal gland nodule with a density of 10 Hounsfield units on noncontrast imaging and 34 Hounsfield units on arterial imaging suggestive an adrenal gland adenoma. Chronic 1.5 cm right adrenal gland nodule with a density 23 Hounsfield  units on noncontrast imaging and 42 Hounsfield unit on arterial imaging suggestive an adrenal adenoma. No further follow-up of adrenal gland nodules given chronicity. Bilateral kidneys enhance symmetrically. Right renal cortical scarring with possible partial nephrectomy. No nephroureterolithiasis bilaterally. No hydronephrosis. No hydroureter. The urinary bladder is unremarkable. Stomach/Bowel: Stomach is within normal limits. No evidence of bowel wall thickening or dilatation. Colonic diverticulosis appendix appears normal. Lymphatic: No lymphadenopathy. Chronic calcified porta hepatic lymph node (10:33). Reproductive: Prostate is prominent size measuring up to 4.5 cm. Other: No intraperitoneal free fluid. No intraperitoneal free gas. No organized fluid collection. Musculoskeletal: No abdominal wall hernia or abnormality. No suspicious lytic or blastic osseous lesions. No acute displaced fracture. Multilevel degenerative changes of the spine. Similar-appearing sclerotic like appearance of the L3 through S1 levels. Grade 1 anterolisthesis of L3 on L4. IMPRESSION: VASCULAR 1. No CT evidence of a gastrointestinal hemorrhage. 2.  Aortic Atherosclerosis (ICD10-I70.0). NON-VASCULAR 1. Interval development of a partially visualized indeterminate triangular shaped left upper lobe 3 mm pulmonary nodule morphology is suggestive of an intrapulmonary lymph node. 2. Chronic 2.1 cm left hepatic lobe lesion suggestive of a hepatic hemangioma. 3. Colonic diverticulosis with no acute diverticulitis. Electronically Signed   By: Tish Frederickson M.D.   On: 04/21/2023 20:00     Data Reviewed: Relevant notes from primary care and specialist visits, past discharge summaries as available in EHR, including Care Everywhere. Prior diagnostic testing as pertinent to current admission diagnoses Updated medications and problem lists for reconciliation ED course, including vitals, labs, imaging, treatment and response to  treatment Triage notes, nursing and pharmacy notes and ED provider's notes Notable results as noted in HPI  Assessment and Plan: * Anemia Suspect 2/2 to chronic blood loss as pt is on plavix now held.  Type/ cross in ed. Gi consulted.  CTA abd gib study negative for any active bleed. Pt f/u with Hematology and will defer EP / BM BX . Repeat hb of 7.8 after one unit.     Latest Ref Rng & Units 04/21/2023   10:38 PM 04/21/2023    4:26 PM 04/21/2023    1:23 PM  CBC  WBC 4.0 - 10.5 K/uL  5.7  5.7   Hemoglobin 13.0 - 17.0 g/dL 7.8  6.9  6.7   Hematocrit 39.0 - 52.0 % 22.9  20.6  20.1   Platelets 150 - 400 K/uL  405  404       Aortic dilatation (HCC) CTA chest ordered for tomorrow am nonurgent.  Seizures (HCC) Seizure precautions, continue patient's Dilantin p.o.  GERD (gastroesophageal reflux disease) IV PPI therapy.  Aspiration precaution.  Essential hypertension Vitals:   04/21/23 1623 04/21/23 1753 04/21/23 1758 04/21/23 1800  BP: 109/73 110/77 110/77 117/78   04/21/23 1815 04/21/23 1827 04/21/23 1828 04/21/23 1830  BP: 103/62 117/78 103/62 105/73   04/21/23 1845 04/21/23 1900  BP: (!) 104/52 98/66  Blood pressures are currently soft we will currently hold patient's enalapril 20 mg and monitor.   Cerebrovascular accident (CVA) (HCC) History of CVA patient currently on aspirin which we will hold will continue patient's Zetia and atorvastatin.  N.p.o. after midnight otherwise.  GI evaluation per a.m. team.  Squamous cell lung cancer (  HCC) Stable per chart review on PET scan.  Patient currently on maintenance regimen.   DVT prophylaxis:  SCD's  Consults:  Gi : Dr.Vanga.   Advance Care Planning:    Code Status: Full Code   Family Communication:  None  Disposition Plan:  Home.   Severity of Illness: The appropriate patient status for this patient is INPATIENT. Inpatient status is judged to be reasonable and necessary in order to provide the required intensity  of service to ensure the patient's safety. The patient's presenting symptoms, physical exam findings, and initial radiographic and laboratory data in the context of their chronic comorbidities is felt to place them at high risk for further clinical deterioration. Furthermore, it is not anticipated that the patient will be medically stable for discharge from the hospital within 2 midnights of admission.  I certify that at the point of admission it is my clinical judgment that the patient will require inpatient hospital care spanning beyond 2 midnights from the point of admission due to high intensity of service, high risk for further deterioration and high frequency of surveillance required.*  Author: Gertha Calkin, MD 04/22/2023 1:41 AM  For on call review [email or web address].

## 2023-04-21 NOTE — ED Provider Notes (Signed)
[facility] Provider Note    Event Date/Time   First MD Initiated Contact with Patient 04/21/23 1658     (approximate)   History   Abnormal Lab   HPI  Derek Webb is a 72 y.o. male  here with anemia. Pt was at cancer center for routine labs, which noted a Hgd drop from 11 to 6.7. was told to come in for evaluation. Reports he has not felt bad other than general fatigue which he attributed to his cancer. No specific melena, hematochezia, or other blood loss. No h/o anemia per his report. No abdominal pain. No syncope. No CP, SOB.3       Physical Exam   Triage Vital Signs: ED Triage Vitals  Encounter Vitals Group     BP 04/21/23 1623 109/73     Systolic BP Percentile --      Diastolic BP Percentile --      Pulse Rate 04/21/23 1623 78     Resp 04/21/23 1623 16     Temp 04/21/23 1622 99.2 F (37.3 C)     Temp src --      SpO2 04/21/23 1623 100 %     Weight 04/21/23 1624 185 lb 3 oz (84 kg)     Height --      Head Circumference --      Peak Flow --      Pain Score 04/21/23 1623 0     Pain Loc --      Pain Education --      Exclude from Growth Chart --     Most recent vital signs: Vitals:   04/21/23 2115 04/21/23 2141  BP: 117/70   Pulse: 74   Resp: (!) 22   Temp:  98.4 F (36.9 C)  SpO2: 100%      General: Awake, no distress.  CV:  Good peripheral perfusion. RRR. Resp:  Normal work of breathing. Lungs clear. Abd:  No distention. No tenderness. Other:  Subconjunctival pallor.    ED Results / Procedures / Treatments   Labs (all labs ordered are listed, but only abnormal results are displayed) Labs Reviewed  CBC - Abnormal; Notable for the following components:      Result Value   RBC 2.20 (*)    Hemoglobin 6.9 (*)    HCT 20.6 (*)    Platelets 405 (*)    All other components within normal limits  BASIC METABOLIC PANEL - Abnormal; Notable for the following components:   Glucose, Bld 112 (*)    Calcium 8.4 (*)    All  other components within normal limits  HEPATIC FUNCTION PANEL - Abnormal; Notable for the following components:   Albumin 3.2 (*)    AST 13 (*)    All other components within normal limits  HEMOGLOBIN AND HEMATOCRIT, BLOOD - Abnormal; Notable for the following components:   Hemoglobin 7.8 (*)    HCT 22.9 (*)    All other components within normal limits  PROTIME-INR  OCCULT BLOOD X 1 CARD TO LAB, STOOL  HIV ANTIBODY (ROUTINE TESTING W REFLEX)  TYPE AND SCREEN  PREPARE RBC (CROSSMATCH)     EKG    RADIOLOGY CT: Pending   I also independently reviewed and agree with radiologist interpretations.   PROCEDURES:  Critical Care performed: Yes, see critical care procedure note(s)  .Critical Care  Performed by: Shaune Pollack, MD Authorized by: Shaune Pollack, MD   Critical care provider statement:    Critical care  time (minutes):  30   Critical care time was exclusive of:  Separately billable procedures and treating other patients   Critical care was necessary to treat or prevent imminent or life-threatening deterioration of the following conditions:  Cardiac failure, respiratory failure and circulatory failure   Critical care was time spent personally by me on the following activities:  Development of treatment plan with patient or surrogate, discussions with consultants, evaluation of patient's response to treatment, examination of patient, ordering and review of laboratory studies, ordering and review of radiographic studies, ordering and performing treatments and interventions, pulse oximetry, re-evaluation of patient's condition and review of old charts     MEDICATIONS ORDERED IN ED: Medications  acetaminophen (TYLENOL) tablet 650 mg (has no administration in time range)    Or  acetaminophen (TYLENOL) suppository 650 mg (has no administration in time range)  morphine (PF) 2 MG/ML injection 2 mg (has no administration in time range)  ondansetron (ZOFRAN) tablet 4 mg  (has no administration in time range)    Or  ondansetron (ZOFRAN) injection 4 mg (has no administration in time range)  hydrALAZINE (APRESOLINE) injection 5 mg (has no administration in time range)  lactated ringers infusion ( Intravenous New Bag/Given 04/21/23 2040)  0.9 %  sodium chloride infusion (10 mL/hr Intravenous New Bag/Given 04/21/23 2014)  pantoprazole (PROTONIX) injection 40 mg (40 mg Intravenous Given 04/21/23 1733)  iohexol (OMNIPAQUE) 350 MG/ML injection 100 mL (100 mLs Intravenous Contrast Given 04/21/23 1914)     IMPRESSION / MDM / ASSESSMENT AND PLAN / ED COURSE  I reviewed the triage vital signs and the nursing notes.                              Differential diagnosis includes, but is not limited to, GI bleed, bone marrow suppression, hemolysis, medication effect  Patient's presentation is most consistent with acute presentation with potential threat to life or bodily function.  The patient is on the cardiac monitor to evaluate for evidence of arrhythmia and/or significant heart rate changes   72 year old male with history of prior alcohol use, lung cancer, seizures, prior stroke, here with significant hemoglobin decreased.  Hemoglobin 6.9 here.  He is on Plavix.  Suspect likely GI bleed in the setting of his reported history of prior gastritis/upper GI issues.  Patient is hemodynamically stable.  No signs of cardiac ischemia.  CT scan pending.  Will transfuse and admit to medicine.  Lab work otherwise overall reassuring.  LFTs are normal.  White count normal.  CMP with normal renal function.   FINAL CLINICAL IMPRESSION(S) / ED DIAGNOSES   Final diagnoses:  Blood loss anemia     Rx / DC Orders   ED Discharge Orders     None        Note:  This document was prepared using Dragon voice recognition software and may include unintentional dictation errors.   Shaune Pollack, MD 04/21/23 2352

## 2023-04-21 NOTE — Progress Notes (Signed)
04/21/23: [facility] ED RN Hospital Liaison for [facility] evaluated/screened patient chart for potential or prior engagements pending disposition. Acuity level 2.  ED from [facility].  PCP: Lynn Ito  Practice: [facility].   Gabriel Cirri RN MSN  [place] [facility] [place]  [facility], Population Health Title: ED RN Liaison Email: [email or web address] Direct Dial: Teams chat or secure chat                        Website: [email or web address]     Note: [facility] ED liaison does not counter or interfere with Inpatient Transitions of Care discharge planning or disposition.

## 2023-04-21 NOTE — Telephone Encounter (Signed)
Patient was seen on Tuesday for his Durvalumab infusion.  He was found to have worsening anemia with a drop from around 11-7.6.  Repeat hemoglobin was 7.4.  Labs consistent with iron deficiency anemia.  There is a concern for GI bleed.  Patient was advised to stop Plavix.  He was hemodynamically stable so we were supporting him outpatient with iron infusion.  Urgent referral to GI was placed and was scheduled for repeat labs on 04/21/2023.  Today, his hemoglobin has dropped to 6.7.  We reached out to GI office and scheduling time is at least 3 to 4 weeks.  I recommend ED evaluation for blood transfusion and further GI workup for GI bleed.  This information will be relayed to patient's sister.

## 2023-04-21 NOTE — Assessment & Plan Note (Signed)
Seizure precautions, continue patient's Dilantin p.o.

## 2023-04-21 NOTE — ED Triage Notes (Signed)
Pt to ED from cancer center for hgb 6.7.

## 2023-04-21 NOTE — Patient Instructions (Signed)
 Iron Sucrose Injection What is this medication? IRON SUCROSE (EYE ern SOO krose) treats low levels of iron (iron deficiency anemia) in people with kidney disease. Iron is a mineral that plays an important role in making red blood cells, which carry oxygen from your lungs to the rest of your body. This medicine may be used for other purposes; ask your health care provider or pharmacist if you have questions. COMMON BRAND NAME(S): Venofer What should I tell my care team before I take this medication? They need to know if you have any of these conditions: Anemia not caused by low iron levels Heart disease High levels of iron in the blood Kidney disease Liver disease An unusual or allergic reaction to iron, other medications, foods, dyes, or preservatives Pregnant or trying to get pregnant Breastfeeding How should I use this medication? This medication is for infusion into a vein. It is given in a hospital or clinic setting. Talk to your care team about the use of this medication in children. While this medication may be prescribed for children as young as 2 years for selected conditions, precautions do apply. Overdosage: If you think you have taken too much of this medicine contact a poison control center or emergency room at once. NOTE: This medicine is only for you. Do not share this medicine with others. What if I miss a dose? Keep appointments for follow-up doses. It is important not to miss your dose. Call your care team if you are unable to keep an appointment. What may interact with this medication? Do not take this medication with any of the following: Deferoxamine Dimercaprol Other iron products This medication may also interact with the following: Chloramphenicol Deferasirox This list may not describe all possible interactions. Give your health care provider a list of all the medicines, herbs, non-prescription drugs, or dietary supplements you use. Also tell them if you smoke,  drink alcohol, or use illegal drugs. Some items may interact with your medicine. What should I watch for while using this medication? Visit your care team regularly. Tell your care team if your symptoms do not start to get better or if they get worse. You may need blood work done while you are taking this medication. You may need to follow a special diet. Talk to your care team. Foods that contain iron include: whole grains/cereals, dried fruits, beans, or peas, leafy green vegetables, and organ meats (liver, kidney). What side effects may I notice from receiving this medication? Side effects that you should report to your care team as soon as possible: Allergic reactions--skin rash, itching, hives, swelling of the face, lips, tongue, or throat Low blood pressure--dizziness, feeling faint or lightheaded, blurry vision Shortness of breath Side effects that usually do not require medical attention (report to your care team if they continue or are bothersome): Flushing Headache Joint pain Muscle pain Nausea Pain, redness, or irritation at injection site This list may not describe all possible side effects. Call your doctor for medical advice about side effects. You may report side effects to FDA at 1-800-FDA-1088. Where should I keep my medication? This medication is given in a hospital or clinic. It will not be stored at home. NOTE: This sheet is a summary. It may not cover all possible information. If you have questions about this medicine, talk to your doctor, pharmacist, or health care provider.  2024 Elsevier/Gold Standard (2023-01-14 00:00:00)

## 2023-04-21 NOTE — Assessment & Plan Note (Signed)
IV PPI therapy.  Aspiration precaution.

## 2023-04-21 NOTE — Assessment & Plan Note (Addendum)
CTA chest ordered for tomorrow am nonurgent.

## 2023-04-22 ENCOUNTER — Ambulatory Visit: Payer: 59

## 2023-04-22 ENCOUNTER — Telehealth: Payer: Self-pay | Admitting: Gastroenterology

## 2023-04-22 DIAGNOSIS — D649 Anemia, unspecified: Secondary | ICD-10-CM | POA: Diagnosis not present

## 2023-04-22 LAB — HIV ANTIBODY (ROUTINE TESTING W REFLEX): HIV Screen 4th Generation wRfx: NONREACTIVE

## 2023-04-22 LAB — HEMOGLOBIN AND HEMATOCRIT, BLOOD
HCT: 21 % — ABNORMAL LOW (ref 39.0–52.0)
Hemoglobin: 7.2 g/dL — ABNORMAL LOW (ref 13.0–17.0)

## 2023-04-22 MED ORDER — POLYSACCHARIDE IRON COMPLEX 150 MG PO CAPS
150.0000 mg | ORAL_CAPSULE | Freq: Every day | ORAL | Status: DC
  Administered 2023-04-23 – 2023-04-29 (×7): 150 mg via ORAL
  Filled 2023-04-22 (×8): qty 1

## 2023-04-22 MED ORDER — FLUTICASONE FUROATE-VILANTEROL 200-25 MCG/ACT IN AEPB
1.0000 | INHALATION_SPRAY | Freq: Every day | RESPIRATORY_TRACT | Status: DC
  Administered 2023-04-22 – 2023-04-29 (×7): 1 via RESPIRATORY_TRACT
  Filled 2023-04-22: qty 28

## 2023-04-22 MED ORDER — VITAMIN C 500 MG PO TABS
500.0000 mg | ORAL_TABLET | Freq: Every day | ORAL | Status: DC
  Administered 2023-04-23 – 2023-04-29 (×7): 500 mg via ORAL
  Filled 2023-04-22 (×7): qty 1

## 2023-04-22 MED ORDER — EZETIMIBE 10 MG PO TABS
10.0000 mg | ORAL_TABLET | Freq: Every day | ORAL | Status: DC
  Administered 2023-04-22 – 2023-04-29 (×8): 10 mg via ORAL
  Filled 2023-04-22 (×8): qty 1

## 2023-04-22 MED ORDER — ENALAPRIL MALEATE 10 MG PO TABS
20.0000 mg | ORAL_TABLET | Freq: Every day | ORAL | Status: DC
  Administered 2023-04-22: 20 mg via ORAL
  Filled 2023-04-22: qty 2
  Filled 2023-04-22: qty 1
  Filled 2023-04-22: qty 2
  Filled 2023-04-22: qty 1

## 2023-04-22 MED ORDER — PHENYTOIN SODIUM EXTENDED 100 MG PO CAPS
400.0000 mg | ORAL_CAPSULE | Freq: Every day | ORAL | Status: DC
  Administered 2023-04-22 – 2023-04-28 (×7): 400 mg via ORAL
  Filled 2023-04-22 (×7): qty 4

## 2023-04-22 MED ORDER — PANTOPRAZOLE SODIUM 40 MG IV SOLR
40.0000 mg | Freq: Two times a day (BID) | INTRAVENOUS | Status: DC
  Administered 2023-04-22 (×2): 40 mg via INTRAVENOUS
  Filled 2023-04-22 (×2): qty 10

## 2023-04-22 MED ORDER — VITAMIN B-12 1000 MCG PO TABS
1000.0000 ug | ORAL_TABLET | Freq: Every day | ORAL | Status: DC
  Administered 2023-04-23 – 2023-04-29 (×7): 1000 ug via ORAL
  Filled 2023-04-22 (×7): qty 1

## 2023-04-22 MED ORDER — ATORVASTATIN CALCIUM 80 MG PO TABS
80.0000 mg | ORAL_TABLET | Freq: Every day | ORAL | Status: DC
  Administered 2023-04-22 – 2023-04-29 (×8): 80 mg via ORAL
  Filled 2023-04-22 (×8): qty 1

## 2023-04-22 NOTE — H&P (View-Only) (Signed)
GI Inpatient Consult Note  Reason for Consult: Acute symptomatic anemia   Attending Requesting Consult: Dr. Lucianne Muss  History of Present Illness: Derek Webb is a 72 y.o. male seen for evaluation of acute symptomatic anemia at the request of Dr. Lucianne Muss. Patient has a PMH of CVA on Plavix, seizures, alcohol use, squamous cell carcinoma of right lung, s/p radiation and started Durvalumab on 08/13/2022, plan for total 12 cycles as per oncology, PET scan was done as an outpatient which showed no recurrence of cancer, ascending thoracic aortic aneurysm 4.4 cm, hypermetabolic incidental left prostate nodule. Patient presented [facility] ED for chief complaint of fatigue with new onset anemia. Upon presentation to the ED, vital signs were stable. Labs were significant for hemoglobin dropped from 11-6.7.  Imaging studies revealed CT angio GI bleed: . No CT evidence of a gastrointestinal hemorrhage. He has received 1 unit of PRBC transfusion in the ED.   Derek Webb reports he has had no GI complaints. He is a vague and inconsistent historian.  He denies any heartburn, reflux, nausea, vomiting, dysphagia, or any abdominal pain.  He reports normal bowel habits without constipation or diarrhea.  He denies any lower abdominal pain.  He has noted no black stools or bright red blood per rectum.  He reports normal appetite and diet in fact is gained weight over the last year. He does not know the medications that he takes at home and cannot identify when he last took Plavix.  Patient has been in the hospital for 22 hours and has not received Plavix.  [name] manages medications and [name] was unable to be reached by telephone for clarification of the last dose of Plavix.   Last Colonoscopy: 04/18/2017: Indic: History of multiple adenomatous polyps.  The findings were 6 polyps,  2 TA ,diverticulosis Repeat in 3 years Last Endoscopy:07/19/2011: IDA, GERD: HH, gastritis. No repeat   Past Medical History:   Past Medical History:  Diagnosis Date   Alcoholism (HCC)    COPD (chronic obstructive pulmonary disease) (HCC)    SEVERE COPD -OCCASIONALLY USES OXYGEN AT NIGHT-DOES NOT USE INHALERS ON REGULAR BASIS   Elevated cholesterol    GERD (gastroesophageal reflux disease)    HOH (hard of hearing)    Hypertension    Lung cancer (HCC)    LEFT UPPER LUNG CARCINOMA-NOT A CANDIDATE FOR SURGICAL RESECTION BECAUSE OF HIS COPD AND POOR CANDIDATE FOR RADIATION BECAUSE OF TRANSPORTATION PROBLEMS   Seizures (HCC)    CHRONIC DILATIN - PT STATES NO SEIZURES IN PAST COUPLE OF YEARS; PT STATES HIS SEIZURES CAUSED NUMBNESS OF ARMS AND HANDS AND NOT ABLE TO MOVE HIS ARMS   Stroke (HCC)    2 TO 3 YRS AGO-AFFECTED HIS SPEECH--ABLE TO AMBULATE WITHOUT ASSIST AND DOES YARD WORK.  DECREASED HEARING IN BOTH EARS SINCE STROKE.   Vitamin D deficiency     Problem List: Patient Active Problem List   Diagnosis Date Noted   Anemia 04/21/2023   Aortic dilatation (HCC) 04/21/2023   Iron deficiency anemia 04/19/2023   Opacity of lung on imaging study 12/31/2022   Hyponatremia 12/03/2022   Anemia due to antineoplastic chemotherapy 07/05/2022   History of lung cancer 04/21/2020   Seizures (HCC) 07/20/2019   Encounter for antineoplastic immunotherapy 07/20/2019   Need for vaccination against Streptococcus pneumoniae using pneumococcal conjugate vaccine 13 03/05/2019   GERD (gastroesophageal reflux disease) 02/08/2018   Obstructive chronic bronchitis without exacerbation 02/08/2018   Encounter for general adult  medical examination with abnormal findings 12/18/2017   Atherosclerosis of aorta (HCC) 12/18/2017   Cerebrovascular accident (CVA) (HCC) 11/01/2017   HOH (hard of hearing) 11/01/2017   Essential hypertension 11/01/2017   Hyperlipidemia 11/01/2017   Hx of adenomatous polyp of colon 12/16/2016   Squamous cell lung cancer [facility])     Past Surgical History: Past Surgical History:  Procedure Laterality Date    CAROTID SURGERY 2009 -LEFT     COLON POLYP EXCISION     COLON SURGERY     COLONOSCOPY WITH PROPOFOL N/A 04/18/2017   Procedure: COLONOSCOPY WITH PROPOFOL;  Surgeon: Scot Jun, MD;  Location: [facility] ENDOSCOPY;  Service: Endoscopy;  Laterality: N/A;   ESOPHAGOGASTRODUODENOSCOPY     IR GENERIC HISTORICAL  01/21/2016   IR RADIOLOGIST EVAL & MGMT 01/21/2016 Irish Lack, MD [facility] INTERV RAD   IR GENERIC HISTORICAL  10/22/2014   IR RADIOLOGIST EVAL & MGMT 10/22/2014 Irish Lack, MD [facility] INTERV RAD   IR IMAGING GUIDED PORT INSERTION  05/28/2022   IR RADIOLOGIST EVAL & MGMT  02/02/2017   IR RADIOLOGIST EVAL & MGMT  03/08/2018   percutaneous biopsy     VIDEO BRONCHOSCOPY WITH ENDOBRONCHIAL ULTRASOUND Right 05/12/2022   Procedure: VIDEO BRONCHOSCOPY WITH ENDOBRONCHIAL ULTRASOUND;  Surgeon: Salena Saner, MD;  Location: [facility] ORS;  Service: Cardiopulmonary;  Laterality: Right;    Allergies: No Known Allergies  Home Medications: Medications Prior to Admission  Medication Sig Dispense Refill Last Dose   aspirin 81 MG tablet Take 81 mg by mouth daily.      atorvastatin (LIPITOR) 80 MG tablet Take 1 tablet (80 mg total) by mouth daily. 90 tablet 3    budesonide-formoterol (SYMBICORT) 160-4.5 MCG/ACT inhaler Inhale 1 puff into the lungs 2 (two) times daily. 1 each 5    Cholecalciferol (VITAMIN D) 2000 UNITS CAPS Take by mouth. VITAMIN D 3 2000 IU  ONE SOFTGEL DAILY  --OTC      clopidogrel (PLAVIX) 75 MG tablet Take 1 tablet (75 mg total) by mouth daily. 90 tablet 3    enalapril (VASOTEC) 20 MG tablet Take 1 tablet (20 mg total) by mouth daily. 90 tablet 3    ezetimibe (ZETIA) 10 MG tablet Take 1 tablet (10 mg total) by mouth daily. 90 tablet 3    pantoprazole (PROTONIX) 40 MG tablet Take 1 tablet (40 mg total) by mouth daily. IN AM 90 tablet 2    phenytoin (DILANTIN) 100 MG ER capsule Take 4 capsules (400 mg total) by mouth at bedtime. 360 capsule 0    Home medication reconciliation was  completed with the patient.   Scheduled Inpatient Medications:    [START ON 04/23/2023] vitamin C  500 mg Oral Daily   atorvastatin  80 mg Oral Daily   [START ON 04/23/2023] vitamin B-12  1,000 mcg Oral Daily   enalapril  20 mg Oral Daily   ezetimibe  10 mg Oral Daily   fluticasone furoate-vilanterol  1 puff Inhalation Daily   [START ON 04/23/2023] iron polysaccharides  150 mg Oral Daily   pantoprazole (PROTONIX) IV  40 mg Intravenous Q12H   phenytoin  400 mg Oral QHS    Continuous Inpatient Infusions:    lactated ringers 50 mL/hr at 04/22/23 0500    PRN Inpatient Medications:  acetaminophen **OR** acetaminophen, hydrALAZINE, morphine injection, ondansetron **OR** ondansetron (ZOFRAN) IV  Family History: family history includes Colon polyps in his sister.  The patient's family history is negative for inflammatory bowel disorders, GI malignancy, or solid  organ transplantation.  Social History:   reports that he quit smoking about 11 years ago. His smoking use included cigarettes. He started smoking about 46 years ago. He has a 35 pack-year smoking history. He has never used smokeless tobacco. He reports current alcohol use. He reports that he does not use drugs. The patient denies ETOH, tobacco, or drug use.   Review of Systems: Constitutional: Weight is stable.  Eyes: No changes in vision. ENT: No oral lesions, sore throat.  GI: see HPI.  Heme/Lymph: No easy bruising.  CV: No chest pain.  GU: No hematuria.  Integumentary: No rashes.  Neuro: No headaches.  Psych: No depression/anxiety.  Endocrine: No heat/cold intolerance.  Allergic/Immunologic: No urticaria.  Resp: No cough, SOB.  Musculoskeletal: No joint swelling.    Physical Examination: BP 109/79 (BP Location: Right Arm)   Pulse 79   Temp 98.4 F (36.9 C) (Oral)   Resp 16   Wt 82.9 kg   SpO2 100%   BMI 28.62 kg/m  Gen: NAD, alert and oriented x 4 HEENT: EOMI, Neck: supple, no JVD or thyromegaly Chest: CTA  bilaterally, no wheezes, crackles, or other adventitious sounds CV: RRR, no m/g/c/r Abd: soft, NT, ND, +BS in all four quadrants; no HSM, guarding, rigidity, or rebound tenderness Ext: no edema, well perfused with 2+ pulses, Skin: no rash or lesions noted Lymph: no LAD  Data: Lab Results  Component Value Date   WBC 5.7 04/21/2023   HGB 7.8 (L) 04/21/2023   HCT 22.9 (L) 04/21/2023   MCV 93.6 04/21/2023   PLT 405 (H) 04/21/2023   Recent Labs  Lab 04/21/23 1323 04/21/23 1626 04/21/23 2238  HGB 6.7* 6.9* 7.8*   Lab Results  Component Value Date   NA 135 04/21/2023   K 3.9 04/21/2023   CL 103 04/21/2023   CO2 25 04/21/2023   BUN 11 04/21/2023   CREATININE 0.70 04/21/2023   Lab Results  Component Value Date   ALT 12 04/21/2023   AST 13 (L) 04/21/2023   GGT 71 (H) 10/07/2022   ALKPHOS 93 04/21/2023   BILITOT 0.6 04/21/2023   Recent Labs  Lab 04/21/23 1852  INR 1.2   Narrative & Impression  CLINICAL DATA:  ABLA Concern for GI bleeding. Has received Iron infusion this week, drop in HGB from 11 to today of 6.7. Prior non-small cell lung cancer.   EXAM: CTA ABDOMEN AND PELVIS WITHOUT AND WITH CONTRAST   TECHNIQUE: Multidetector CT imaging of the abdomen and pelvis was performed using the standard protocol during bolus administration of intravenous contrast. Multiplanar reconstructed images and MIPs were obtained and reviewed to evaluate the vascular anatomy.   RADIATION DOSE REDUCTION: This exam was performed according to the departmental dose-optimization program which includes automated exposure control, adjustment of the mA and/or kV according to patient size and/or use of iterative reconstruction technique.   CONTRAST:  OMNIPAQUE IOHEXOL 350 MG/ML SOLN   COMPARISON:  CT chest 01/19/2023. CT chest 01/26/2017, CT chest 06/27/2012, pet ct 04/08/22   FINDINGS: VASCULAR   No extravasation of intravenous contrast noted within the lumen of the bowel.    Aorta: Severe atherosclerotic plaque. Normal caliber aorta without aneurysm, dissection, vasculitis or significant stenosis.   Celiac: Mild atherosclerotic plaque. Patent without evidence of aneurysm, dissection, vasculitis or significant stenosis.   SMA: Mild atherosclerotic plaque. Patent without evidence of aneurysm, dissection, vasculitis or significant stenosis.   Renals: Moderate atherosclerotic plaque. Both renal arteries are patent without evidence of aneurysm,  dissection, vasculitis, fibromuscular dysplasia or significant stenosis.   IMA: Mild atherosclerotic plaque. Patent without evidence of aneurysm, dissection, vasculitis or significant stenosis.   Inflow: Moderate to severe atherosclerotic plaque. Patent without evidence of aneurysm, dissection, vasculitis or significant stenosis.   Proximal Outflow:Marland Kitchen Moderate severe atherosclerotic plaque peer bilateral common femoral and visualized portions of the superficial and profunda femoral arteries are patent without evidence of aneurysm, dissection, vasculitis or significant stenosis.   Veins: The portal, splenic, superior mesenteric veins are patent.   Review of the MIP images confirms the above findings.   NON-VASCULAR   Lower chest: Interval development of a partially visualized subpleural indeterminate triangular shaped left upper lobe 3 mm pulmonary nodule (6:4). Cardiac changes suggestive of anemia.   Hepatobiliary: There is a chronic 2.1 cm left hepatic lobe peripherally discontinuously enhancing mass on articular view that appears to be more centrally filled-in on venous view suggestive of a hepatic hemangioma. No gallstones, gallbladder wall thickening, or pericholecystic fluid. No biliary dilatation.   Pancreas: No focal lesion. Normal pancreatic contour. No surrounding inflammatory changes. No main pancreatic ductal dilatation.   Spleen: Normal in size without focal abnormality.   Adrenals/Urinary  Tract:   Chronic 2.2 cm left adrenal gland nodule with a density of 10 Hounsfield units on noncontrast imaging and 34 Hounsfield units on arterial imaging suggestive an adrenal gland adenoma. Chronic 1.5 cm right adrenal gland nodule with a density 23 Hounsfield units on noncontrast imaging and 42 Hounsfield unit on arterial imaging suggestive an adrenal adenoma. No further follow-up of adrenal gland nodules given chronicity.   Bilateral kidneys enhance symmetrically. Right renal cortical scarring with possible partial nephrectomy. No nephroureterolithiasis bilaterally. No hydronephrosis. No hydroureter.   The urinary bladder is unremarkable.   Stomach/Bowel: Stomach is within normal limits. No evidence of bowel wall thickening or dilatation. Colonic diverticulosis appendix appears normal.   Lymphatic: No lymphadenopathy. Chronic calcified porta hepatic lymph node (10:33).   Reproductive: Prostate is prominent size measuring up to 4.5 cm.   Other: No intraperitoneal free fluid. No intraperitoneal free gas. No organized fluid collection.   Musculoskeletal:   No abdominal wall hernia or abnormality.   No suspicious lytic or blastic osseous lesions. No acute displaced fracture. Multilevel degenerative changes of the spine. Similar-appearing sclerotic like appearance of the L3 through S1 levels. Grade 1 anterolisthesis of L3 on L4.   IMPRESSION: VASCULAR   1. No CT evidence of a gastrointestinal hemorrhage. 2.  Aortic Atherosclerosis (ICD10-I70.0).   NON-VASCULAR   1. Interval development of a partially visualized indeterminate triangular shaped left upper lobe 3 mm pulmonary nodule morphology is suggestive of an intrapulmonary lymph node. 2. Chronic 2.1 cm left hepatic lobe lesion suggestive of a hepatic hemangioma. 3. Colonic diverticulosis with no acute diverticulitis.     Electronically Signed   By: Tish Frederickson M.D.   On: 04/21/2023 20:00     Derek Webb  is a 72 y.o. male seen for evaluation of acute symptomatic anemia at the request of Dr. Lucianne Muss. Patient has a PMH of CVA on Plavix, seizures, alcohol use, squamous cell carcinoma of right lung, s/p radiation and started Durvalumab on 08/13/2022, plan for total 12 cycles as per oncology, PET scan was done as an outpatient which showed no recurrence of cancer, ascending thoracic aortic aneurysm 4.4 cm, hypermetabolic incidental left prostate nodule. Patient presented to his usual Oncology lab appt and was found to have a drop in complaint of fatigue with new onset anemia.  Assessment/Plan:  Symptomatic  anemia with fatigue CVA on Plavix H/o EtOH dependence Phx Multiple polyps including advanced colon polyps Squamous cell lung cancer on systemic chemotherapy - vital signs are stable. - Labs were significant for hemoglobin dropped from 11-6.7.  Imaging studies revealed CT angio GI bleed:  -. No CT evidence of a gastrointestinal hemorrhage.  He has received 1 unit of PRBC transfusion in the ED and Hgb is improved to 7.8 -no overt signs of GI bleed  - He is a vague and inconsistent  historian and sister [name] manages his medications. and overall care.  -  He is known to [facility]  from last csy in 2018- overdue for personal history  of colon polyps. Remote EGD- no hx of UGI bleed.   Recommendations: Consider EGD tomorrow if consent can be obtained.   May need to speak with sister for consent. I called and left message for sister [name] to call the office.  - Clear liquid diet today  -NPO after MN - continue pantoprazole 40 mg IV every 12 hours Labs reviewed Hold DVT prophylaxis Monitor H&H Avoid frequent lab draws to prevent lab induced anemia Supportive care and antiemetics per primary team Maintain 2 sites IV access Avoid NSAIDs Monitor for GI bleed Outpatient  colonoscopy   Thank you for the consult. Please call with questions or concerns.  Amedeo Kinsman, ANP [facility]  Gastroenterology [phone]

## 2023-04-22 NOTE — Consult Note (Addendum)
GI Inpatient Consult Note  Reason for Consult: Acute symptomatic anemia   Attending Requesting Consult: Dr. Lucianne Muss  History of Present Illness: Derek Webb is a 72 y.o. male seen for evaluation of acute symptomatic anemia at the request of Dr. Lucianne Muss. Patient has a PMH of CVA on Plavix, seizures, alcohol use, squamous cell carcinoma of right lung, s/p radiation and started Durvalumab on 08/13/2022, plan for total 12 cycles as per oncology, PET scan was done as an outpatient which showed no recurrence of cancer, ascending thoracic aortic aneurysm 4.4 cm, hypermetabolic incidental left prostate nodule. Patient presented to the [facility] ED for chief complaint of fatigue with new onset anemia. Upon presentation to the ED, vital signs were stable. Labs were significant for hemoglobin dropped from 11-6.7.  Imaging studies revealed CT angio GI bleed: . No CT evidence of a gastrointestinal hemorrhage. He has received 1 unit of PRBC transfusion in the ED.   Derek Webb reports he has had no GI complaints. He is a vague and inconsistent historian.  He denies any heartburn, reflux, nausea, vomiting, dysphagia, or any abdominal pain.  He reports normal bowel habits without constipation or diarrhea.  He denies any lower abdominal pain.  He has noted no black stools or bright red blood per rectum.  He reports normal appetite and diet in fact is gained weight over the last year. He does not know the medications that he takes at home and cannot identify when he last took Plavix.  Patient has been in the hospital for 22 hours and has not received Plavix.  His sister Derek Webb manages medications and Derek Webb was unable to be reached by telephone for clarification of the last dose of Plavix.   Last Colonoscopy: 04/18/2017: Indic: History of multiple adenomatous polyps.  The findings were 6 polyps,  2 TA ,diverticulosis Repeat in 3 years Last Endoscopy:07/19/2011: IDA, GERD: HH, gastritis. No repeat   Past Medical History:   Past Medical History:  Diagnosis Date   Alcoholism (HCC)    COPD (chronic obstructive pulmonary disease) (HCC)    SEVERE COPD -OCCASIONALLY USES OXYGEN AT NIGHT-DOES NOT USE INHALERS ON REGULAR BASIS   Elevated cholesterol    GERD (gastroesophageal reflux disease)    HOH (hard of hearing)    Hypertension    Lung cancer (HCC)    LEFT UPPER LUNG CARCINOMA-NOT A CANDIDATE FOR SURGICAL RESECTION BECAUSE OF HIS COPD AND POOR CANDIDATE FOR RADIATION BECAUSE OF TRANSPORTATION PROBLEMS   Seizures (HCC)    CHRONIC DILATIN - PT STATES NO SEIZURES IN PAST COUPLE OF YEARS; PT STATES HIS SEIZURES CAUSED NUMBNESS OF ARMS AND HANDS AND NOT ABLE TO MOVE HIS ARMS   Stroke (HCC)    2 TO 3 YRS AGO-AFFECTED HIS SPEECH--ABLE TO AMBULATE WITHOUT ASSIST AND DOES YARD WORK.  DECREASED HEARING IN BOTH EARS SINCE STROKE.   Vitamin D deficiency     Problem List: Patient Active Problem List   Diagnosis Date Noted   Anemia 04/21/2023   Aortic dilatation (HCC) 04/21/2023   Iron deficiency anemia 04/19/2023   Opacity of lung on imaging study 12/31/2022   Hyponatremia 12/03/2022   Anemia due to antineoplastic chemotherapy 07/05/2022   History of lung cancer 04/21/2020   Seizures (HCC) 07/20/2019   Encounter for antineoplastic immunotherapy 07/20/2019   Need for vaccination against Streptococcus pneumoniae using pneumococcal conjugate vaccine 13 03/05/2019   GERD (gastroesophageal reflux disease) 02/08/2018   Obstructive chronic bronchitis without exacerbation 02/08/2018   Encounter for general adult  medical examination with abnormal findings 12/18/2017   Atherosclerosis of aorta (HCC) 12/18/2017   Cerebrovascular accident (CVA) (HCC) 11/01/2017   HOH (hard of hearing) 11/01/2017   Essential hypertension 11/01/2017   Hyperlipidemia 11/01/2017   Hx of adenomatous polyp of colon 12/16/2016   Squamous cell lung cancer [facility])     Past Surgical History: Past Surgical History:  Procedure Laterality Date    CAROTID SURGERY 2009 -LEFT     COLON POLYP EXCISION     COLON SURGERY     COLONOSCOPY WITH PROPOFOL N/A 04/18/2017   Procedure: COLONOSCOPY WITH PROPOFOL;  Surgeon: Scot Jun, MD;  Location: [facility] ENDOSCOPY;  Service: Endoscopy;  Laterality: N/A;   ESOPHAGOGASTRODUODENOSCOPY     IR GENERIC HISTORICAL  01/21/2016   IR RADIOLOGIST EVAL & MGMT 01/21/2016 Irish Lack, MD [facility] INTERV RAD   IR GENERIC HISTORICAL  10/22/2014   IR RADIOLOGIST EVAL & MGMT 10/22/2014 Irish Lack, MD [facility] INTERV RAD   IR IMAGING GUIDED PORT INSERTION  05/28/2022   IR RADIOLOGIST EVAL & MGMT  02/02/2017   IR RADIOLOGIST EVAL & MGMT  03/08/2018   percutaneous biopsy     VIDEO BRONCHOSCOPY WITH ENDOBRONCHIAL ULTRASOUND Right 05/12/2022   Procedure: VIDEO BRONCHOSCOPY WITH ENDOBRONCHIAL ULTRASOUND;  Surgeon: Salena Saner, MD;  Location: [facility] ORS;  Service: Cardiopulmonary;  Laterality: Right;    Allergies: No Known Allergies  Home Medications: Medications Prior to Admission  Medication Sig Dispense Refill Last Dose   aspirin 81 MG tablet Take 81 mg by mouth daily.      atorvastatin (LIPITOR) 80 MG tablet Take 1 tablet (80 mg total) by mouth daily. 90 tablet 3    budesonide-formoterol (SYMBICORT) 160-4.5 MCG/ACT inhaler Inhale 1 puff into the lungs 2 (two) times daily. 1 each 5    Cholecalciferol (VITAMIN D) 2000 UNITS CAPS Take by mouth. VITAMIN D 3 2000 IU  ONE SOFTGEL DAILY  --OTC      clopidogrel (PLAVIX) 75 MG tablet Take 1 tablet (75 mg total) by mouth daily. 90 tablet 3    enalapril (VASOTEC) 20 MG tablet Take 1 tablet (20 mg total) by mouth daily. 90 tablet 3    ezetimibe (ZETIA) 10 MG tablet Take 1 tablet (10 mg total) by mouth daily. 90 tablet 3    pantoprazole (PROTONIX) 40 MG tablet Take 1 tablet (40 mg total) by mouth daily. IN AM 90 tablet 2    phenytoin (DILANTIN) 100 MG ER capsule Take 4 capsules (400 mg total) by mouth at bedtime. 360 capsule 0    Home medication reconciliation was  completed with the patient.   Scheduled Inpatient Medications:    [START ON 04/23/2023] vitamin C  500 mg Oral Daily   atorvastatin  80 mg Oral Daily   [START ON 04/23/2023] vitamin B-12  1,000 mcg Oral Daily   enalapril  20 mg Oral Daily   ezetimibe  10 mg Oral Daily   fluticasone furoate-vilanterol  1 puff Inhalation Daily   [START ON 04/23/2023] iron polysaccharides  150 mg Oral Daily   pantoprazole (PROTONIX) IV  40 mg Intravenous Q12H   phenytoin  400 mg Oral QHS    Continuous Inpatient Infusions:    lactated ringers 50 mL/hr at 04/22/23 0500    PRN Inpatient Medications:  acetaminophen **OR** acetaminophen, hydrALAZINE, morphine injection, ondansetron **OR** ondansetron (ZOFRAN) IV  Family History: family history includes Colon polyps in his sister.  The patient's family history is negative for inflammatory bowel disorders, GI malignancy, or solid  organ transplantation.  Social History:   reports that he quit smoking about 11 years ago. His smoking use included cigarettes. He started smoking about 46 years ago. He has a 35 pack-year smoking history. He has never used smokeless tobacco. He reports current alcohol use. He reports that he does not use drugs. The patient denies ETOH, tobacco, or drug use.   Review of Systems: Constitutional: Weight is stable.  Eyes: No changes in vision. ENT: No oral lesions, sore throat.  GI: see HPI.  Heme/Lymph: No easy bruising.  CV: No chest pain.  GU: No hematuria.  Integumentary: No rashes.  Neuro: No headaches.  Psych: No depression/anxiety.  Endocrine: No heat/cold intolerance.  Allergic/Immunologic: No urticaria.  Resp: No cough, SOB.  Musculoskeletal: No joint swelling.    Physical Examination: BP 109/79 (BP Location: Right Arm)   Pulse 79   Temp 98.4 F (36.9 C) (Oral)   Resp 16   Wt 82.9 kg   SpO2 100%   BMI 28.62 kg/m  Gen: NAD, alert and oriented x 4 HEENT: EOMI, Neck: supple, no JVD or thyromegaly Chest: CTA  bilaterally, no wheezes, crackles, or other adventitious sounds CV: RRR, no m/g/c/r Abd: soft, NT, ND, +BS in all four quadrants; no HSM, guarding, rigidity, or rebound tenderness Ext: no edema, well perfused with 2+ pulses, Skin: no rash or lesions noted Lymph: no LAD  Data: Lab Results  Component Value Date   WBC 5.7 04/21/2023   HGB 7.8 (L) 04/21/2023   HCT 22.9 (L) 04/21/2023   MCV 93.6 04/21/2023   PLT 405 (H) 04/21/2023   Recent Labs  Lab 04/21/23 1323 04/21/23 1626 04/21/23 2238  HGB 6.7* 6.9* 7.8*   Lab Results  Component Value Date   NA 135 04/21/2023   K 3.9 04/21/2023   CL 103 04/21/2023   CO2 25 04/21/2023   BUN 11 04/21/2023   CREATININE 0.70 04/21/2023   Lab Results  Component Value Date   ALT 12 04/21/2023   AST 13 (L) 04/21/2023   GGT 71 (H) 10/07/2022   ALKPHOS 93 04/21/2023   BILITOT 0.6 04/21/2023   Recent Labs  Lab 04/21/23 1852  INR 1.2   Narrative & Impression  CLINICAL DATA:  ABLA Concern for GI bleeding. Has received Iron infusion this week, drop in HGB from 11 to today of 6.7. Prior non-small cell lung cancer.   EXAM: CTA ABDOMEN AND PELVIS WITHOUT AND WITH CONTRAST   TECHNIQUE: Multidetector CT imaging of the abdomen and pelvis was performed using the standard protocol during bolus administration of intravenous contrast. Multiplanar reconstructed images and MIPs were obtained and reviewed to evaluate the vascular anatomy.   RADIATION DOSE REDUCTION: This exam was performed according to the departmental dose-optimization program which includes automated exposure control, adjustment of the mA and/or kV according to patient size and/or use of iterative reconstruction technique.   CONTRAST:  OMNIPAQUE IOHEXOL 350 MG/ML SOLN   COMPARISON:  CT chest 01/19/2023. CT chest 01/26/2017, CT chest 06/27/2012, pet ct 04/08/22   FINDINGS: VASCULAR   No extravasation of intravenous contrast noted within the lumen of the bowel.    Aorta: Severe atherosclerotic plaque. Normal caliber aorta without aneurysm, dissection, vasculitis or significant stenosis.   Celiac: Mild atherosclerotic plaque. Patent without evidence of aneurysm, dissection, vasculitis or significant stenosis.   SMA: Mild atherosclerotic plaque. Patent without evidence of aneurysm, dissection, vasculitis or significant stenosis.   Renals: Moderate atherosclerotic plaque. Both renal arteries are patent without evidence of aneurysm,  dissection, vasculitis, fibromuscular dysplasia or significant stenosis.   IMA: Mild atherosclerotic plaque. Patent without evidence of aneurysm, dissection, vasculitis or significant stenosis.   Inflow: Moderate to severe atherosclerotic plaque. Patent without evidence of aneurysm, dissection, vasculitis or significant stenosis.   Proximal Outflow:Marland Kitchen Moderate severe atherosclerotic plaque peer bilateral common femoral and visualized portions of the superficial and profunda femoral arteries are patent without evidence of aneurysm, dissection, vasculitis or significant stenosis.   Veins: The portal, splenic, superior mesenteric veins are patent.   Review of the MIP images confirms the above findings.   NON-VASCULAR   Lower chest: Interval development of a partially visualized subpleural indeterminate triangular shaped left upper lobe 3 mm pulmonary nodule (6:4). Cardiac changes suggestive of anemia.   Hepatobiliary: There is a chronic 2.1 cm left hepatic lobe peripherally discontinuously enhancing mass on articular view that appears to be more centrally filled-in on venous view suggestive of a hepatic hemangioma. No gallstones, gallbladder wall thickening, or pericholecystic fluid. No biliary dilatation.   Pancreas: No focal lesion. Normal pancreatic contour. No surrounding inflammatory changes. No main pancreatic ductal dilatation.   Spleen: Normal in size without focal abnormality.   Adrenals/Urinary  Tract:   Chronic 2.2 cm left adrenal gland nodule with a density of 10 Hounsfield units on noncontrast imaging and 34 Hounsfield units on arterial imaging suggestive an adrenal gland adenoma. Chronic 1.5 cm right adrenal gland nodule with a density 23 Hounsfield units on noncontrast imaging and 42 Hounsfield unit on arterial imaging suggestive an adrenal adenoma. No further follow-up of adrenal gland nodules given chronicity.   Bilateral kidneys enhance symmetrically. Right renal cortical scarring with possible partial nephrectomy. No nephroureterolithiasis bilaterally. No hydronephrosis. No hydroureter.   The urinary bladder is unremarkable.   Stomach/Bowel: Stomach is within normal limits. No evidence of bowel wall thickening or dilatation. Colonic diverticulosis appendix appears normal.   Lymphatic: No lymphadenopathy. Chronic calcified porta hepatic lymph node (10:33).   Reproductive: Prostate is prominent size measuring up to 4.5 cm.   Other: No intraperitoneal free fluid. No intraperitoneal free gas. No organized fluid collection.   Musculoskeletal:   No abdominal wall hernia or abnormality.   No suspicious lytic or blastic osseous lesions. No acute displaced fracture. Multilevel degenerative changes of the spine. Similar-appearing sclerotic like appearance of the L3 through S1 levels. Grade 1 anterolisthesis of L3 on L4.   IMPRESSION: VASCULAR   1. No CT evidence of a gastrointestinal hemorrhage. 2.  Aortic Atherosclerosis (ICD10-I70.0).   NON-VASCULAR   1. Interval development of a partially visualized indeterminate triangular shaped left upper lobe 3 mm pulmonary nodule morphology is suggestive of an intrapulmonary lymph node. 2. Chronic 2.1 cm left hepatic lobe lesion suggestive of a hepatic hemangioma. 3. Colonic diverticulosis with no acute diverticulitis.     Electronically Signed   By: Tish Frederickson M.D.   On: 04/21/2023 20:00     Derek Webb  is a 72 y.o. male seen for evaluation of acute symptomatic anemia at the request of Dr. Lucianne Muss. Patient has a PMH of CVA on Plavix, seizures, alcohol use, squamous cell carcinoma of right lung, s/p radiation and started Durvalumab on 08/13/2022, plan for total 12 cycles as per oncology, PET scan was done as an outpatient which showed no recurrence of cancer, ascending thoracic aortic aneurysm 4.4 cm, hypermetabolic incidental left prostate nodule. Patient presented to his usual Oncology lab appt and was found to have a drop in complaint of fatigue with new onset anemia.  Assessment/Plan:  Symptomatic  anemia with fatigue CVA on Plavix H/o EtOH dependence Phx Multiple polyps including advanced colon polyps Squamous cell lung cancer on systemic chemotherapy - vital signs are stable. - Labs were significant for hemoglobin dropped from 11-6.7.  Imaging studies revealed CT angio GI bleed:  -. No CT evidence of a gastrointestinal hemorrhage.  He has received 1 unit of PRBC transfusion in the ED and Hgb is improved to 7.8 -no overt signs of GI bleed  - He is a vague and inconsistent  historian and sister Derek Webb manages his medications. and overall care.  -  He is known to [facility]  from last csy in 2018- overdue for personal history  of colon polyps. Remote EGD- no hx of UGI bleed.   Recommendations: Consider EGD tomorrow if consent can be obtained.   May need to speak with sister for consent. I called and left message for sister Derek Webb to call the office.  - Clear liquid diet today  -NPO after MN - continue pantoprazole 40 mg IV every 12 hours Labs reviewed Hold DVT prophylaxis Monitor H&H Avoid frequent lab draws to prevent lab induced anemia Supportive care and antiemetics per primary team Maintain 2 sites IV access Avoid NSAIDs Monitor for GI bleed Outpatient  colonoscopy   Thank you for the consult. Please call with questions or concerns.  Amedeo Kinsman, ANP [facility]  Gastroenterology [phone]

## 2023-04-22 NOTE — Progress Notes (Addendum)
[facility] Progress Note  Patient: Derek Webb    ZOX:[id number]  DOA: 04/21/2023     Date of Service: the patient was seen and examined on 04/22/2023  Chief Complaint  Patient presents with   Abnormal Lab   Brief hospital course: [facility] is a 72 y.o. male with PMH of CVA, seizures, alcohol use, squamous cell carcinoma of right lung, s/p radiation and started Durvalumab on 08/13/2022, plan for total 12 cycles as per oncology.  PET scan was done as an outpatient which showed no recurrence of cancer.  Hypermetabolic incidental left prostate nodule, patient was seen by urologist Dr. Morrie Sheldon, per note low suspicion for prostate cancer.  Recommended to follow-up in 1 year with urologist, ascending thoracic aortic aneurysm 4.4 cm was diagnosed in August 2023, recommended annual follow-up but patient is not aware, patient has follow-up with GI in 4 weeks, as reviewed from EMR, patient was sent by oncologist for anemia.  Hemoglobin dropped from 11-6.7 so patient was sent to ED for further workup and management.  ED workup VS stable, hemoglobin 6.7, CT angio GI bleed: . No CT evidence of a gastrointestinal hemorrhage. Aortic Atherosclerosis NON-VASCULAR: 1. Interval development of a partially visualized indeterminate triangular shaped left upper lobe 3 mm pulmonary nodule morphology is suggestive of an intrapulmonary lymph node. 2. Chronic 2.1 cm left hepatic lobe lesion suggestive of a hepatic hemangioma. 3. Colonic diverticulosis with no acute diverticulitis.  1 unit of PRBC transfusion was given in the ED, GI was consulted.  TRH consulted for admission and further management as below.   Assessment and Plan:  Symptomatic anemia, unknown cause, no overt GI bleeding Hb 6.7-27.8 1 unit PRBC transfused Monitor H&H and transfuse if hemoglobin less than 7 Continue PPI Hold Plavix Keep n.p.o. Follow GI for possible EGD today   Iron deficiency, transferrin saturation 12% Start  oral iron supplement with vitamin C B12 level 373, goal >400, started oral supplement.   Hypertension Continue enalapril 20 mg p.o. daily home dose Monitor BP and titrate medications accordingly  H/o CVA and seizures Continue seizure precaution Continue Dilantin Continue Lipitor and Zetia Hold Plavix for now  Ascending thoracic aortic aneurysm Patient denies any chest pain, it seems stable.  Patient was not aware of aneurysm.  It was diagnosed in August 2023 Patient already received IV contrast for CT abdomen so to avoid excessive contrast I would recommend to follow-up with an outpatient to repeat CTA chest as an outpatient.  GERD on PPI  Squamous cell carcinoma of the lung Continue to follow oncologist as per recommendation and continue chemotherapy  Suspected prostate cancer, continue to follow with the urologist as an outpatient   Body mass index is 28.62 kg/m.  Interventions:   Consults:  GI Procedures: Plan for EGD today  Diet: NPO DVT Prophylaxis: SCD, pharmacological prophylaxis contraindicated due to Anemia     Advance goals of care discussion: Full code  Family Communication: family was  notpresent at bedside, at the time of interview.  The pt provided permission to discuss medical plan with the family. Opportunity was given to ask question and all questions were answered satisfactorily.   Disposition:  Pt is from Home, admitted with Low Hb s/p PRBC, GI consulted, pending EGD, which precludes a safe discharge. Discharge to [facility], when stable, most likely in 1 to 2 days.  Subjective: No significant events overnight, patient was resting orderly, denied any abdominal pain, no chest pain or palpitation.  Patient denied any GI bleeding.  Physical Exam: General: NAD, lying comfortably Appear in no distress, affect appropriate Eyes: PERRLA ENT: Oral Mucosa Clear, moist  Neck: no JVD,  Cardiovascular: S1 and S2 Present, no Murmur,  Respiratory: good respiratory  effort, Bilateral Air entry equal and Decreased, no Crackles, no wheezes Abdomen: Bowel Sound present, Soft and no tenderness,  Skin: no rashes Extremities: no Pedal edema, no calf tenderness Neurologic: without any new focal findings Gait not checked due to patient safety concerns  Vitals:   04/22/23 0400 04/22/23 0500 04/22/23 0745 04/22/23 1204  BP: 114/79 129/72 118/79 (!) 151/88  Pulse: 72 75 77 84  Resp: 19 20 16 20   Temp:   98.4 F (36.9 C) 98.1 F (36.7 C)  TempSrc:      SpO2: 96% 96% 98% 99%  Weight:        Intake/Output Summary (Last 24 hours) at 04/22/2023 1349 Last data filed at 04/22/2023 1201 Gross per 24 hour  Intake 896.67 ml  Output 1025 ml  Net -128.33 ml   Filed Weights   04/21/23 1624 04/21/23 2141  Weight: 84 kg 82.9 kg    Data Reviewed: I have personally reviewed and interpreted daily labs, tele strips, imagings as discussed above. I reviewed all nursing notes, pharmacy notes, vitals, pertinent old records I have discussed plan of care as described above with RN and patient/family.  CBC: Recent Labs  Lab 04/19/23 0944 04/19/23 1041 04/21/23 1323 04/21/23 1626 04/21/23 2238  WBC 5.1 5.3 5.7 5.7  --   NEUTROABS 3.5 3.6 3.3  --   --   HGB 7.4* 7.6* 6.7* 6.9* 7.8*  HCT 22.2* 23.0* 20.1* 20.6* 22.9*  MCV 91.4 92.0 92.6 93.6  --   PLT 337 342 404* 405*  --    Basic Metabolic Panel: Recent Labs  Lab 04/19/23 0944 04/21/23 1626  NA 137 135  K 3.6 3.9  CL 106 103  CO2 25 25  GLUCOSE 98 112*  BUN 12 11  CREATININE 0.52* 0.70  CALCIUM 8.3* 8.4*    Studies: CT ANGIO GI BLEED  Result Date: 04/21/2023 CLINICAL DATA:  ABLA Concern for GI bleeding. Has received Iron infusion this week, drop in HGB from 11 to today of 6.7. Prior non-small cell lung cancer. EXAM: CTA ABDOMEN AND PELVIS WITHOUT AND WITH CONTRAST TECHNIQUE: Multidetector CT imaging of the abdomen and pelvis was performed using the standard protocol during bolus administration of  intravenous contrast. Multiplanar reconstructed images and MIPs were obtained and reviewed to evaluate the vascular anatomy. RADIATION DOSE REDUCTION: This exam was performed according to the departmental dose-optimization program which includes automated exposure control, adjustment of the mA and/or kV according to patient size and/or use of iterative reconstruction technique. CONTRAST:  OMNIPAQUE IOHEXOL 350 MG/ML SOLN COMPARISON:  CT chest 01/19/2023. CT chest 01/26/2017, CT chest 06/27/2012, pet ct 04/08/22 FINDINGS: VASCULAR No extravasation of intravenous contrast noted within the lumen of the bowel. Aorta: Severe atherosclerotic plaque. Normal caliber aorta without aneurysm, dissection, vasculitis or significant stenosis. Celiac: Mild atherosclerotic plaque. Patent without evidence of aneurysm, dissection, vasculitis or significant stenosis. SMA: Mild atherosclerotic plaque. Patent without evidence of aneurysm, dissection, vasculitis or significant stenosis. Renals: Moderate atherosclerotic plaque. Both renal arteries are patent without evidence of aneurysm, dissection, vasculitis, fibromuscular dysplasia or significant stenosis. IMA: Mild atherosclerotic plaque. Patent without evidence of aneurysm, dissection, vasculitis or significant stenosis. Inflow: Moderate to severe atherosclerotic plaque. Patent without evidence of aneurysm, dissection, vasculitis or significant stenosis. Proximal Outflow:Marland Kitchen Moderate severe atherosclerotic plaque peer  bilateral common femoral and visualized portions of the superficial and profunda femoral arteries are patent without evidence of aneurysm, dissection, vasculitis or significant stenosis. Veins: The portal, splenic, superior mesenteric veins are patent. Review of the MIP images confirms the above findings. NON-VASCULAR Lower chest: Interval development of a partially visualized subpleural indeterminate triangular shaped left upper lobe 3 mm pulmonary nodule (6:4).  Cardiac changes suggestive of anemia. Hepatobiliary: There is a chronic 2.1 cm left hepatic lobe peripherally discontinuously enhancing mass on articular view that appears to be more centrally filled-in on venous view suggestive of a hepatic hemangioma. No gallstones, gallbladder wall thickening, or pericholecystic fluid. No biliary dilatation. Pancreas: No focal lesion. Normal pancreatic contour. No surrounding inflammatory changes. No main pancreatic ductal dilatation. Spleen: Normal in size without focal abnormality. Adrenals/Urinary Tract: Chronic 2.2 cm left adrenal gland nodule with a density of 10 Hounsfield units on noncontrast imaging and 34 Hounsfield units on arterial imaging suggestive an adrenal gland adenoma. Chronic 1.5 cm right adrenal gland nodule with a density 23 Hounsfield units on noncontrast imaging and 42 Hounsfield unit on arterial imaging suggestive an adrenal adenoma. No further follow-up of adrenal gland nodules given chronicity. Bilateral kidneys enhance symmetrically. Right renal cortical scarring with possible partial nephrectomy. No nephroureterolithiasis bilaterally. No hydronephrosis. No hydroureter. The urinary bladder is unremarkable. Stomach/Bowel: Stomach is within normal limits. No evidence of bowel wall thickening or dilatation. Colonic diverticulosis appendix appears normal. Lymphatic: No lymphadenopathy. Chronic calcified porta hepatic lymph node (10:33). Reproductive: Prostate is prominent size measuring up to 4.5 cm. Other: No intraperitoneal free fluid. No intraperitoneal free gas. No organized fluid collection. Musculoskeletal: No abdominal wall hernia or abnormality. No suspicious lytic or blastic osseous lesions. No acute displaced fracture. Multilevel degenerative changes of the spine. Similar-appearing sclerotic like appearance of the L3 through S1 levels. Grade 1 anterolisthesis of L3 on L4. IMPRESSION: VASCULAR 1. No CT evidence of a gastrointestinal hemorrhage. 2.   Aortic Atherosclerosis (ICD10-I70.0). NON-VASCULAR 1. Interval development of a partially visualized indeterminate triangular shaped left upper lobe 3 mm pulmonary nodule morphology is suggestive of an intrapulmonary lymph node. 2. Chronic 2.1 cm left hepatic lobe lesion suggestive of a hepatic hemangioma. 3. Colonic diverticulosis with no acute diverticulitis. Electronically Signed   By: Tish Frederickson M.D.   On: 04/21/2023 20:00    Scheduled Meds:  [START ON 04/23/2023] vitamin C  500 mg Oral Daily   [START ON 04/23/2023] vitamin B-12  1,000 mcg Oral Daily   [START ON 04/23/2023] iron polysaccharides  150 mg Oral Daily   pantoprazole (PROTONIX) IV  40 mg Intravenous Q12H   Continuous Infusions:  lactated ringers 50 mL/hr at 04/22/23 0500   PRN Meds: acetaminophen **OR** acetaminophen, hydrALAZINE, morphine injection, ondansetron **OR** ondansetron (ZOFRAN) IV  Time spent: 35 minutes  Author: Gillis Santa. MD [facility] 04/22/2023 1:49 PM  To reach On-call, see care teams to locate the attending and reach out to them via [email or web address]. If 7PM-7AM, please contact night-coverage If you still have difficulty reaching the attending provider, please page the [facility] (Director on Call) for [facility] on amion for assistance.

## 2023-04-22 NOTE — Telephone Encounter (Signed)
I called to the patient to get him schedule but she sister stated that he is in the hospital getting an infusion.

## 2023-04-22 NOTE — Progress Notes (Signed)
This Chap visited Pt while rounding unit. Pt expresses need of some food. Mirna Mires introduces Spiritual care to the Pt and provided calming presence.   04/22/23 1600  Spiritual Encounters  Type of Visit Initial  Care provided to: Patient  Referral source Chaplain assessment  Reason for visit Routine spiritual support  OnCall Visit No

## 2023-04-22 NOTE — Progress Notes (Signed)
Transition of Care [facility]) - Inpatient Brief Assessment   Patient Details  Name: Derek Webb MRN: [id number] Date of Birth: [birth year]  Transition of Care [facility] CM/SW Contact:    Truddie Hidden, RN Phone Number: 04/22/2023, 12:49 PM   Clinical Narrative: Transition of Care Department [facility]) has reviewed patient .We will continue to monitor patient advancement If new patient transition needs arise, please place a TOC consult.     Transition of Care Asessment: Insurance and Status: Insurance coverage has been reviewed Patient has primary care physician: Yes Home environment has been reviewed: Return to home Prior level of function:: Independent Prior/Current Home Services: No current home services Social Determinants of Health Reivew: SDOH reviewed no interventions necessary Readmission risk has been reviewed: Yes Transition of care needs: no transition of care needs at this time

## 2023-04-22 NOTE — Anesthesia Preprocedure Evaluation (Signed)
Anesthesia Evaluation  Patient identified by MRN, date of birth, ID band Patient awake    Reviewed: Allergy & Precautions, H&P , NPO status , Patient's Chart, lab work & pertinent test results, reviewed documented beta blocker date and time   History of Anesthesia Complications Negative for: history of anesthetic complications  Airway Mallampati: II  TM Distance: >3 FB Neck ROM: full    Dental  (+) Missing, Dental Advidsory Given, Poor Dentition   Pulmonary neg pulmonary ROS, shortness of breath, Continuous Positive Airway Pressure Ventilation , COPD,  COPD inhaler and oxygen dependent, neg recent URI, former smoker   Pulmonary exam normal breath sounds clear to auscultation       Cardiovascular Exercise Tolerance: Good hypertension, (-) angina (-) Past MI Normal cardiovascular exam Rhythm:regular Rate:Normal     Neuro/Psych Seizures -,  CVA, Residual Symptoms  negative psych ROS   GI/Hepatic Neg liver ROS,GERD  Controlled,,  Endo/Other  negative endocrine ROS    Renal/GU negative Renal ROS  negative genitourinary   Musculoskeletal   Abdominal   Peds  Hematology  (+) Blood dyscrasia, anemia   Anesthesia Other Findings Past Medical History: No date: Alcoholism (HCC) No date: COPD (chronic obstructive pulmonary disease) (HCC)     Comment:  SEVERE COPD -OCCASIONALLY USES OXYGEN AT NIGHT-DOES NOT               USE INHALERS ON REGULAR BASIS No date: Elevated cholesterol No date: GERD (gastroesophageal reflux disease) No date: HOH (hard of hearing) No date: Hypertension No date: Lung cancer (HCC)     Comment:  LEFT UPPER LUNG CARCINOMA-NOT A CANDIDATE FOR SURGICAL               RESECTION BECAUSE OF HIS COPD AND POOR CANDIDATE FOR               RADIATION BECAUSE OF TRANSPORTATION PROBLEMS No date: Seizures (HCC)     Comment:  CHRONIC DILATIN - PT STATES NO SEIZURES IN PAST COUPLE               OF YEARS; PT STATES HIS  SEIZURES CAUSED NUMBNESS OF ARMS               AND HANDS AND NOT ABLE TO MOVE HIS ARMS No date: Stroke (HCC)     Comment:  2 TO 3 YRS AGO-AFFECTED HIS SPEECH--ABLE TO AMBULATE               WITHOUT ASSIST AND DOES YARD WORK.  DECREASED HEARING IN               BOTH EARS SINCE STROKE. No date: Vitamin D deficiency   Reproductive/Obstetrics negative OB ROS                             Anesthesia Physical Anesthesia Plan  ASA: 3  Anesthesia Plan: General   Post-op Pain Management:    Induction: Intravenous  PONV Risk Score and Plan: Propofol infusion and TIVA  Airway Management Planned: Natural Airway and Nasal Cannula  Additional Equipment:   Intra-op Plan:   Post-operative Plan:   Informed Consent: I have reviewed the patients History and Physical, chart, labs and discussed the procedure including the risks, benefits and alternatives for the proposed anesthesia with the patient or authorized representative who has indicated his/her understanding and acceptance.     Consent reviewed with POA  Plan Discussed with: Anesthesiologist, CRNA and Surgeon  Anesthesia Plan Comments: (Due to documented difficult historian/poor understanding of medical situation, I discussed risks of anesthesia with patient's sister Baris Pajak by phone, including possibility of difficulty with spontaneous ventilation under anesthesia necessitating airway intervention, PONV, and rare risks such as cardiac or respiratory or neurological events, and allergic reactions. Discussed the role of CRNA in patient's perioperative care. She understands. Corinda Gubler, MD)        Anesthesia Quick Evaluation

## 2023-04-22 NOTE — Plan of Care (Signed)
  Problem: Health Behavior/Discharge Planning: Goal: Ability to manage health-related needs will improve Outcome: Progressing   Problem: Clinical Measurements: Goal: Ability to maintain clinical measurements within normal limits will improve Outcome: Progressing Goal: Will remain free from infection Outcome: Progressing   

## 2023-04-23 ENCOUNTER — Inpatient Hospital Stay: Payer: 59 | Admitting: Anesthesiology

## 2023-04-23 ENCOUNTER — Other Ambulatory Visit: Payer: Self-pay

## 2023-04-23 ENCOUNTER — Encounter
Admission: EM | Disposition: A | Discharge status: Home / Self Care | Payer: Self-pay | Source: Home / Self Care | Attending: Student

## 2023-04-23 ENCOUNTER — Encounter: Payer: Self-pay | Admitting: Internal Medicine

## 2023-04-23 DIAGNOSIS — D649 Anemia, unspecified: Secondary | ICD-10-CM | POA: Diagnosis not present

## 2023-04-23 HISTORY — PX: ESOPHAGOGASTRODUODENOSCOPY (EGD) WITH PROPOFOL: SHX5813

## 2023-04-23 LAB — MAGNESIUM: Magnesium: 2 mg/dL (ref 1.7–2.4)

## 2023-04-23 LAB — BASIC METABOLIC PANEL
Anion gap: 6 (ref 5–15)
BUN: 7 mg/dL — ABNORMAL LOW (ref 8–23)
CO2: 26 mmol/L (ref 22–32)
Calcium: 8.5 mg/dL — ABNORMAL LOW (ref 8.9–10.3)
Chloride: 106 mmol/L (ref 98–111)
Creatinine, Ser: 0.55 mg/dL — ABNORMAL LOW (ref 0.61–1.24)
GFR, Estimated: >60 mL/min (ref >60)
Glucose, Bld: 84 mg/dL (ref 70–99)
Potassium: 4 mmol/L (ref 3.5–5.1)
Sodium: 138 mmol/L (ref 135–145)

## 2023-04-23 LAB — CBC
HCT: 22.8 % — ABNORMAL LOW (ref 39.0–52.0)
Hemoglobin: 7.5 g/dL — ABNORMAL LOW (ref 13.0–17.0)
MCH: 30.4 pg (ref 26.0–34.0)
MCHC: 32.9 g/dL (ref 30.0–36.0)
MCV: 92.3 fL (ref 80.0–100.0)
Platelets: 422 10*3/uL — ABNORMAL HIGH (ref 150–400)
RBC: 2.47 MIL/uL — ABNORMAL LOW (ref 4.22–5.81)
RDW: 14.7 % (ref 11.5–15.5)
WBC: 5.2 10*3/uL (ref 4.0–10.5)
nRBC: 0 % (ref 0.0–0.2)

## 2023-04-23 LAB — PHOSPHORUS: Phosphorus: 3.1 mg/dL (ref 2.5–4.6)

## 2023-04-23 LAB — VITAMIN D 25 HYDROXY (VIT D DEFICIENCY, FRACTURES): Vit D, 25-Hydroxy: 64.37 ng/mL (ref 30–100)

## 2023-04-23 SURGERY — ESOPHAGOGASTRODUODENOSCOPY (EGD) WITH PROPOFOL
Anesthesia: General

## 2023-04-23 MED ORDER — GLYCOPYRROLATE 0.2 MG/ML IJ SOLN
INTRAMUSCULAR | Status: DC | PRN
  Administered 2023-04-23: .2 mg via INTRAVENOUS

## 2023-04-23 MED ORDER — PANTOPRAZOLE SODIUM 40 MG PO TBEC
40.0000 mg | DELAYED_RELEASE_TABLET | Freq: Two times a day (BID) | ORAL | Status: DC
  Administered 2023-04-23 – 2023-04-29 (×13): 40 mg via ORAL
  Filled 2023-04-23 (×13): qty 1

## 2023-04-23 MED ORDER — PROPOFOL 10 MG/ML IV BOLUS
INTRAVENOUS | Status: AC
  Filled 2023-04-23: qty 40

## 2023-04-23 MED ORDER — PROPOFOL 500 MG/50ML IV EMUL
INTRAVENOUS | Status: DC | PRN
  Administered 2023-04-23: 175 ug/kg/min via INTRAVENOUS

## 2023-04-23 MED ORDER — LIDOCAINE HCL (CARDIAC) PF 100 MG/5ML IV SOSY
PREFILLED_SYRINGE | INTRAVENOUS | Status: DC | PRN
  Administered 2023-04-23: 60 mg via INTRAVENOUS

## 2023-04-23 MED ORDER — SODIUM CHLORIDE 0.9 % IV SOLN
INTRAVENOUS | Status: DC | PRN
Start: 2023-04-23 — End: 2023-04-23

## 2023-04-23 MED ORDER — PROPOFOL 10 MG/ML IV BOLUS
INTRAVENOUS | Status: DC | PRN
Start: 2023-04-23 — End: 2023-04-23
  Administered 2023-04-23: 100 mg via INTRAVENOUS

## 2023-04-23 MED ORDER — GLYCOPYRROLATE 0.2 MG/ML IJ SOLN
INTRAMUSCULAR | Status: AC
  Filled 2023-04-23: qty 1

## 2023-04-23 MED ORDER — LIDOCAINE HCL (PF) 2 % IJ SOLN
INTRAMUSCULAR | Status: AC
  Filled 2023-04-23: qty 5

## 2023-04-23 MED ORDER — DEXMEDETOMIDINE HCL IN NACL 200 MCG/50ML IV SOLN
INTRAVENOUS | Status: DC | PRN
Start: 2023-04-23 — End: 2023-04-23
  Administered 2023-04-23: 16 ug via INTRAVENOUS
  Administered 2023-04-23: 4 ug via INTRAVENOUS

## 2023-04-23 MED ORDER — PHENYLEPHRINE HCL (PRESSORS) 10 MG/ML IV SOLN
INTRAVENOUS | Status: DC | PRN
  Administered 2023-04-23 (×2): 160 ug via INTRAVENOUS

## 2023-04-23 NOTE — Plan of Care (Signed)
  Problem: Clinical Measurements: Goal: Respiratory complications will improve Outcome: Progressing   Problem: Nutrition: Goal: Adequate nutrition will be maintained Outcome: Progressing   Problem: Coping: Goal: Level of anxiety will decrease Outcome: Progressing   Problem: Elimination: Goal: Will not experience complications related to bowel motility Outcome: Progressing Goal: Will not experience complications related to urinary retention Outcome: Progressing   Problem: Pain Managment: Goal: General experience of comfort will improve Outcome: Progressing   Problem: Education: Goal: Knowledge of General Education information will improve Description: Including pain rating scale, medication(s)/side effects and non-pharmacologic comfort measures Outcome: Not Progressing   Problem: Health Behavior/Discharge Planning: Goal: Ability to manage health-related needs will improve Outcome: Not Progressing

## 2023-04-23 NOTE — Interval H&P Note (Signed)
History and Physical Interval Note: Consult note from 04/23/23 was reviewed and there was no interval change after seeing and examining the patient.   Hgb stable at 7.5. Vital signs stable.  Written consent was obtained from the patient/patient representative after discussion of risks, benefits, and alternatives. Patient has consented to proceed with Esophagogastroduodenoscopy with possible intervention   04/23/2023 7:45 AM  Derek Webb  has presented today for surgery, with the diagnosis of symptomatic anemia.  The various methods of treatment have been discussed with the patient and family. After consideration of risks, benefits and other options for treatment, the patient has consented to  Procedure(s): ESOPHAGOGASTRODUODENOSCOPY (EGD) WITH PROPOFOL (N/A) as a surgical intervention.  The patient's history has been reviewed, patient examined, no change in status, stable for surgery.  I have reviewed the patient's chart and labs.  Questions were answered to the patient's satisfaction.     Jaynie Collins

## 2023-04-23 NOTE — Care Plan (Signed)
Brief GI Post Op Note  EGD performed. See op report for further details. Mild gastritis and esophagitis, but not significant enough to cause patient's anemia. Will plan for colonoscopy on Tuesday after 5 day washout of plavix Low residue diet now Transition ppi to po I spoke with his sister post procedure and updated on findings and gi plan Hold dvt ppx and plavix Monitor H&H.  Transfusion and resuscitation as per primary team Avoid frequent lab draws to prevent lab induced anemia Supportive care and antiemetics as per primary team Maintain two sites IV access Avoid nsaids Monitor for GIB.  Enis Slipper, DO [facility]

## 2023-04-23 NOTE — Op Note (Signed)
[facility] Patient Name: Derek Webb Procedure Date: 04/23/2023 8:01 AM MRN: [id number] Account #: [id number] Date of Birth: 1950/09/25 Admit Type: Inpatient Age: 72 Room: [facility] ENDO ROOM 1 Gender: Male Note Status: Finalized Instrument Name: Upper Endoscope [id number] Procedure:             Upper GI endoscopy Indications:           Acute post hemorrhagic anemia Providers:             Jaynie Collins DO, DO Medicines:             Monitored Anesthesia Care Complications:         No immediate complications. Estimated blood loss:                         Minimal. Procedure:             Pre-Anesthesia Assessment:                        - Prior to the procedure, a History and Physical was                         performed, and patient medications and allergies were                         reviewed. The patient is competent. The risks and                         benefits of the procedure and the sedation options and                         risks were discussed with the patient. All questions                         were answered and informed consent was obtained.                         Patient identification and proposed procedure were                         verified by the physician, the nurse, the anesthetist                         and the technician in the endoscopy suite. Mental                         Status Examination: alert and oriented. Airway                         Examination: normal oropharyngeal airway and neck                         mobility. Respiratory Examination: clear to                         auscultation. CV Examination: RRR, no murmurs, no S3                         or S4. Prophylactic Antibiotics: The patient  does not                         require prophylactic antibiotics. Prior                         Anticoagulants: The patient has taken Plavix                         (clopidogrel), last dose was 2 days prior to                          procedure. ASA Grade Assessment: III - A patient with                         severe systemic disease. After reviewing the risks and                         benefits, the patient was deemed in satisfactory                         condition to undergo the procedure. The anesthesia                         plan was to use monitored anesthesia care (MAC).                         Immediately prior to administration of medications,                         the patient was re-assessed for adequacy to receive                         sedatives. The heart rate, respiratory rate, oxygen                         saturations, blood pressure, adequacy of pulmonary                         ventilation, and response to care were monitored                         throughout the procedure. The physical status of the                         patient was re-assessed after the procedure.                        After obtaining informed consent, the endoscope was                         passed under direct vision. Throughout the procedure,                         the patient's blood pressure, pulse, and oxygen                         saturations were monitored continuously. The Endoscope  was introduced through the mouth, and advanced to the                         second part of duodenum. The upper GI endoscopy was                         accomplished without difficulty. The patient tolerated                         the procedure well. Findings:      The duodenal bulb, first portion of the duodenum and second portion of       the duodenum were normal. Biopsies for histology were taken with a cold       forceps for evaluation of celiac disease. Estimated blood loss was       minimal.      Localized mild inflammation characterized by erosions and erythema was       found in the gastric antrum. Biopsies were taken with a cold forceps for       Helicobacter pylori testing.  Estimated blood loss was minimal.      A small hiatal hernia was present. Estimated blood loss: none.      The exam of the stomach was otherwise normal.      The Z-line was regular. Estimated blood loss: none.      Esophagogastric landmarks were identified: the gastroesophageal junction       was found at 37 cm from the incisors.      Non-severe esophagitis with no bleeding was found. Estimated blood loss:       none.      The exam of the esophagus was otherwise normal. Impression:            - Normal duodenal bulb, first portion of the duodenum                         and second portion of the duodenum. Biopsied.                        - Gastritis. Biopsied.                        - Small hiatal hernia.                        - Z-line regular.                        - Esophagogastric landmarks identified.                        - Non-severe esophagitis with no bleeding. Recommendation:        - Return patient to hospital ward for ongoing care.                        - Low residue diet.                        - Continue present medications.                        - No ibuprofen, naproxen, or other non-steroidal  anti-inflammatory drugs.                        - Await pathology results.                        - Can transition protonix to oral 40 mg twice a day.                        - Plan for colonoscopy with possible intervention on                         Tuesday after 5 days of plavix washout (given history                         of polyps and likely need for polypectomy).                        This was discussed with his sister, [name], over the                         phone.                        Will start low residue diet today                        Start miralax daily.                        - The findings and recommendations were discussed with                         the patient.                        - The findings and recommendations were  discussed with                         the patient's family. Procedure Code(s):     --- Professional ---                        [phone], Esophagogastroduodenoscopy, flexible,                         transoral; with biopsy, single or multiple Diagnosis Code(s):     --- Professional ---                        K29.70, Gastritis, unspecified, without bleeding                        K44.9, Diaphragmatic hernia without obstruction or                         gangrene                        K20.90, Esophagitis, unspecified without bleeding                        D62, Acute posthemorrhagic anemia CPT copyright 2022  American Medical Association. All rights reserved. The codes documented in this report are preliminary and upon coder review may  be revised to meet current compliance requirements. Attending Participation:      I personally performed the entire procedure. Elfredia Nevins, DO Jaynie Collins DO, DO 04/23/2023 8:31:47 AM This report has been signed electronically. Number of Addenda: 0 Note Initiated On: 04/23/2023 8:01 AM Estimated Blood Loss:  Estimated blood loss was minimal.      [facility]

## 2023-04-23 NOTE — Progress Notes (Signed)
[facility] Progress Note  Patient: Derek Webb    WUJ:[id number]  DOA: 04/21/2023     Date of Service: the patient was seen and examined on 04/23/2023  Chief Complaint  Patient presents with   Abnormal Lab   Brief hospital course: Derek Webb is a 72 y.o. male with PMH of CVA, seizures, alcohol use, squamous cell carcinoma of right lung, s/p radiation and started Durvalumab on 08/13/2022, plan for total 12 cycles as per oncology.  PET scan was done as an outpatient which showed no recurrence of cancer.  Hypermetabolic incidental left prostate nodule, patient was seen by urologist Dr. Morrie Sheldon, per note low suspicion for prostate cancer.  Recommended to follow-up in 1 year with urologist, ascending thoracic aortic aneurysm 4.4 cm was diagnosed in August 2023, recommended annual follow-up but patient is not aware, patient has follow-up with GI in 4 weeks, as reviewed from EMR, patient was sent by oncologist for anemia.  Hemoglobin dropped from 11-6.7 so patient was sent to ED for further workup and management.  ED workup VS stable, hemoglobin 6.7, CT angio GI bleed: . No CT evidence of a gastrointestinal hemorrhage. Aortic Atherosclerosis NON-VASCULAR: 1. Interval development of a partially visualized indeterminate triangular shaped left upper lobe 3 mm pulmonary nodule morphology is suggestive of an intrapulmonary lymph node. 2. Chronic 2.1 cm left hepatic lobe lesion suggestive of a hepatic hemangioma. 3. Colonic diverticulosis with no acute diverticulitis.  1 unit of PRBC transfusion was given in the ED, GI was consulted.  TR[facility] consulted for admission and further management as below.   Assessment and Plan:  Symptomatic anemia, unknown cause, no overt GI bleeding Hb 6.7-7.8---7.2--7.5 stable 1 unit PRBC transfused S/p PPI IV, transition to pantoprazole 40 mg p.o. twice daily Monitor H&H and transfuse if hemoglobin less than 7 Hold Plavix GI consulted s/p EGD found to have mild  gastritis, no bleeding.  GI recommended to resume diet, possible colonoscopy on Tuesday after 5 days of Plavix    Iron deficiency, transferrin saturation 12% Start oral iron supplement with vitamin C B12 level 373, goal >400, started oral supplement.   Hypertension S/p  enalapril 20 mg p.o. daily home dose, held on 8/31 due to low BP  Monitor BP and titrate medications accordingly Use hydralazine as needed   H/o CVA and seizures Continue seizure precaution Continue Dilantin Continue Lipitor and Zetia Hold Plavix for now  Ascending thoracic aortic aneurysm Patient denies any chest pain, it seems stable.  Patient was not aware of aneurysm.  It was diagnosed in August 2023 Patient already received IV contrast for CT abdomen so to avoid excessive contrast I would recommend to follow-up with an outpatient to repeat CTA chest as an outpatient.  GERD on PPI  Squamous cell carcinoma of the lung Continue to follow oncologist as per recommendation and continue chemotherapy  Suspected prostate cancer, recommended to follow with the urologist as an outpatient   Body mass index is 28.62 kg/m.  Interventions:   Consults:  GI Procedures: s/p EGD done on 8/31 Plan for colonoscopy on Tuesday   Diet: Soft diet DVT Prophylaxis: SCD, pharmacological prophylaxis contraindicated due to Anemia     Advance goals of care discussion: Full code  Family Communication: family was  notpresent at bedside, at the time of interview.  The pt provided permission to discuss medical plan with the family. Opportunity was given to ask question and all questions were answered satisfactorily.   Disposition:  Pt is from Home, admitted  with Low Hb s/p PRBC, GI consulted, s/p EGD, plan for colonoscopy on Tuesday, which precludes a safe discharge. Discharge to [facility], when stable, most likely on Wednesday after colonoscopy on Tuesday  Subjective: No significant events overnight, patient was slightly confused,  initially said no procedure was done.  He was holding papers of endoscopy.  After looking at the bedside told him that endoscopy is done, then he said he has done.  Denied any abdominal pain, no chest pain or pressure, no shortness of breath.  Physical Exam: General: NAD, lying comfortably Appear in no distress, affect appropriate Eyes: PERRLA ENT: Oral Mucosa Clear, moist  Neck: no JVD,  Cardiovascular: S1 and S2 Present, no Murmur,  Respiratory: good respiratory effort, Bilateral Air entry equal and Decreased, no Crackles, no wheezes Abdomen: Bowel Sound present, Soft and no tenderness,  Skin: no rashes Extremities: no Pedal edema, no calf tenderness Neurologic: without any new focal findings Gait not checked due to patient safety concerns  Vitals:   04/23/23 0835 04/23/23 0845 04/23/23 0850 04/23/23 0950  BP: 94/71 105/69  117/72  Pulse: 76 72 71   Resp: 19 19 17 19   Temp:   98.2 F (36.8 C)   TempSrc:      SpO2: 99% 99% 100%   Weight:        Intake/Output Summary (Last 24 hours) at 04/23/2023 1502 Last data filed at 04/23/2023 1305 Gross per 24 hour  Intake 2169.96 ml  Output 2450 ml  Net -280.04 ml   Filed Weights   04/21/23 1624 04/21/23 2141  Weight: 84 kg 82.9 kg    Data Reviewed: I have personally reviewed and interpreted daily labs, tele strips, imagings as discussed above. I reviewed all nursing notes, pharmacy notes, vitals, pertinent old records I have discussed plan of care as described above with RN and patient/family.  CBC: Recent Labs  Lab 04/19/23 0944 04/19/23 0944 04/19/23 1041 04/21/23 1323 04/21/23 1626 04/21/23 2238 04/22/23 1544 04/23/23 0514  WBC 5.1  --  5.3 5.7 5.7  --   --  5.2  NEUTROABS 3.5  --  3.6 3.3  --   --   --   --   HGB 7.4*   < > 7.6* 6.7* 6.9* 7.8* 7.2* 7.5*  HCT 22.2*  --  23.0* 20.1* 20.6* 22.9* 21.0* 22.8*  MCV 91.4  --  92.0 92.6 93.6  --   --  92.3  PLT 337  --  342 404* 405*  --   --  422*   < > = values in this  interval not displayed.   Basic Metabolic Panel: Recent Labs  Lab 04/19/23 0944 04/21/23 1626 04/23/23 0514  NA 137 135 138  K 3.6 3.9 4.0  CL 106 103 106  CO2 25 25 26   GLUCOSE 98 112* 84  BUN 12 11 7*  CREATININE 0.52* 0.70 0.55*  CALCIUM 8.3* 8.4* 8.5*  MG  --   --  2.0  PHOS  --   --  3.1    Studies: No results found.  Scheduled Meds:  vitamin C  500 mg Oral Daily   atorvastatin  80 mg Oral Daily   vitamin B-12  1,000 mcg Oral Daily   ezetimibe  10 mg Oral Daily   fluticasone furoate-vilanterol  1 puff Inhalation Daily   iron polysaccharides  150 mg Oral Daily   pantoprazole  40 mg Oral BID   phenytoin  400 mg Oral QHS   Continuous Infusions:  lactated  ringers 50 mL/hr at 04/22/23 2115   PRN Meds: acetaminophen **OR** acetaminophen, hydrALAZINE, morphine injection, ondansetron **OR** ondansetron (ZOFRAN) IV  Time spent: 35 minutes  Author: Gillis Santa. MD [facility] 04/23/2023 3:02 PM  To reach On-call, see care teams to locate the attending and reach out to them via [email or web address]. If 7PM-7AM, please contact night-coverage If you still have difficulty reaching the attending provider, please page the [facility] (Director on Call) for [facility] on amion for assistance.

## 2023-04-23 NOTE — Anesthesia Postprocedure Evaluation (Signed)
Anesthesia Post Note  Patient: Derek Webb  Procedure(s) Performed: ESOPHAGOGASTRODUODENOSCOPY (EGD) WITH PROPOFOL  Patient location during evaluation: Endoscopy Anesthesia Type: General Level of consciousness: awake and alert Pain management: pain level controlled Vital Signs Assessment: post-procedure vital signs reviewed and stable Respiratory status: spontaneous breathing, nonlabored ventilation, respiratory function stable and patient connected to nasal cannula oxygen Cardiovascular status: blood pressure returned to baseline and stable Postop Assessment: no apparent nausea or vomiting Anesthetic complications: no   No notable events documented.   Last Vitals:  Vitals:   04/23/23 0845 04/23/23 0850  BP: 105/69   Pulse: 72 71  Resp: 19 17  Temp:  36.8 C  SpO2: 99% 100%    Last Pain:  Vitals:   04/23/23 0850  TempSrc:   PainSc: 0-No pain                 Lenard Simmer

## 2023-04-23 NOTE — Transfer of Care (Signed)
Immediate Anesthesia Transfer of Care Note  Patient: Derek Webb  Procedure(s) Performed: ESOPHAGOGASTRODUODENOSCOPY (EGD) WITH PROPOFOL  Patient Location: PACU  Anesthesia Type:General  Level of Consciousness: sedated  Airway & Oxygen Therapy: Patient Spontanous Breathing  Post-op Assessment: Report given to RN and Post -op Vital signs reviewed and stable  Post vital signs: Reviewed and stable  Last Vitals:  Vitals Value Taken Time  BP 75/46 04/23/23 0821  Temp    Pulse 66 04/23/23 0824  Resp 22 04/23/23 0824  SpO2 98 % 04/23/23 0824  Vitals shown include unfiled device data.  Last Pain:  Vitals:   04/23/23 0733  TempSrc:   PainSc: 0-No pain         Complications: No notable events documented.

## 2023-04-24 ENCOUNTER — Inpatient Hospital Stay: Payer: 59

## 2023-04-24 DIAGNOSIS — R410 Disorientation, unspecified: Secondary | ICD-10-CM | POA: Diagnosis not present

## 2023-04-24 DIAGNOSIS — D649 Anemia, unspecified: Secondary | ICD-10-CM | POA: Diagnosis not present

## 2023-04-24 LAB — PROTIME-INR
INR: 1.1 (ref 0.8–1.2)
Prothrombin Time: 14.6 s (ref 11.4–15.2)

## 2023-04-24 LAB — GLUCOSE, CAPILLARY: Glucose-Capillary: 112 mg/dL — ABNORMAL HIGH (ref 70–99)

## 2023-04-24 LAB — BASIC METABOLIC PANEL
Anion gap: 6 (ref 5–15)
BUN: 8 mg/dL (ref 8–23)
CO2: 26 mmol/L (ref 22–32)
Calcium: 8.8 mg/dL — ABNORMAL LOW (ref 8.9–10.3)
Chloride: 110 mmol/L (ref 98–111)
Creatinine, Ser: 0.65 mg/dL (ref 0.61–1.24)
GFR, Estimated: >60 mL/min (ref >60)
Glucose, Bld: 110 mg/dL — ABNORMAL HIGH (ref 70–99)
Potassium: 3.7 mmol/L (ref 3.5–5.1)
Sodium: 142 mmol/L (ref 135–145)

## 2023-04-24 LAB — CBC
HCT: 20.7 % — ABNORMAL LOW (ref 39.0–52.0)
Hemoglobin: 7.1 g/dL — ABNORMAL LOW (ref 13.0–17.0)
MCH: 31.1 pg (ref 26.0–34.0)
MCHC: 34.3 g/dL (ref 30.0–36.0)
MCV: 90.8 fL (ref 80.0–100.0)
Platelets: 415 10*3/uL — ABNORMAL HIGH (ref 150–400)
RBC: 2.28 MIL/uL — ABNORMAL LOW (ref 4.22–5.81)
RDW: 14.8 % (ref 11.5–15.5)
WBC: 7.1 10*3/uL (ref 4.0–10.5)
nRBC: 0.3 % — ABNORMAL HIGH (ref 0.0–0.2)

## 2023-04-24 LAB — PREPARE RBC (CROSSMATCH)

## 2023-04-24 LAB — HEMOGLOBIN AND HEMATOCRIT, BLOOD
HCT: 23.6 % — ABNORMAL LOW (ref 39.0–52.0)
Hemoglobin: 8.1 g/dL — ABNORMAL LOW (ref 13.0–17.0)

## 2023-04-24 LAB — D-DIMER, QUANTITATIVE: D-Dimer, Quant: 1.39 ug{FEU}/mL — ABNORMAL HIGH (ref 0.00–0.50)

## 2023-04-24 MED ORDER — TRAZODONE HCL 50 MG PO TABS
50.0000 mg | ORAL_TABLET | Freq: Every evening | ORAL | Status: DC | PRN
  Administered 2023-04-24 – 2023-04-28 (×5): 50 mg via ORAL
  Filled 2023-04-24 (×5): qty 1

## 2023-04-24 MED ORDER — BISACODYL 5 MG PO TBEC: 10.0000 mg | DELAYED_RELEASE_TABLET | Freq: Once | ORAL | Status: DC

## 2023-04-24 MED ORDER — SODIUM CHLORIDE 0.9% IV SOLUTION: Freq: Once | INTRAVENOUS | Status: AC

## 2023-04-24 MED ORDER — POLYETHYLENE GLYCOL 3350 17 G PO PACK
17.0000 g | PACK | Freq: Two times a day (BID) | ORAL | Status: DC
  Administered 2023-04-24 – 2023-04-25 (×2): 17 g via ORAL
  Filled 2023-04-24 (×3): qty 1

## 2023-04-24 MED ORDER — LORAZEPAM 2 MG/ML IJ SOLN
0.5000 mg | Freq: Once | INTRAMUSCULAR | Status: AC | PRN
  Administered 2023-04-24: 0.5 mg via INTRAVENOUS
  Filled 2023-04-24: qty 1

## 2023-04-24 NOTE — Progress Notes (Signed)
Inpatient Follow-up/Progress Note   Patient ID: Derek Webb is a 72 y.o. male.  Overnight Events / Subjective Findings Currently resting in bed comfortably. Reportedly confused and restless overnight. Pt unable to provide any reliable history. Denies pain. Hgb 7.1 today No other acute GI complaints. Plan for colonoscopy Tuesday after antiplt washout  Review of Systems  Constitutional:  Negative for appetite change.  Gastrointestinal:  Negative for abdominal distention, abdominal pain, anal bleeding, blood in stool, constipation, diarrhea, nausea, rectal pain and vomiting.  Psychiatric/Behavioral:  Positive for confusion.      Medications  Current Facility-Administered Medications:    acetaminophen (TYLENOL) tablet 650 mg, 650 mg, Oral, Q6H PRN **OR** acetaminophen (TYLENOL) suppository 650 mg, 650 mg, Rectal, Q6H PRN, Gertha Calkin, MD   ascorbic acid (VITAMIN C) tablet 500 mg, 500 mg, Oral, Daily, Gillis Santa, MD, 500 mg at 04/23/23 1002   atorvastatin (LIPITOR) tablet 80 mg, 80 mg, Oral, Daily, Gillis Santa, MD, 80 mg at 04/23/23 1002   cyanocobalamin (VITAMIN B12) tablet 1,000 mcg, 1,000 mcg, Oral, Daily, Gillis Santa, MD, 1,000 mcg at 04/23/23 1002   ezetimibe (ZETIA) tablet 10 mg, 10 mg, Oral, Daily, Gillis Santa, MD, 10 mg at 04/23/23 1002   fluticasone furoate-vilanterol (BREO ELLIPTA) 200-25 MCG/ACT 1 puff, 1 puff, Inhalation, Daily, Gillis Santa, MD, 1 puff at 04/22/23 1538   hydrALAZINE (APRESOLINE) injection 5 mg, 5 mg, Intravenous, Q4H PRN, Gertha Calkin, MD   iron polysaccharides (NIFEREX) capsule 150 mg, 150 mg, Oral, Daily, Gillis Santa, MD, 150 mg at 04/23/23 1006   lactated ringers infusion, , Intravenous, Continuous, Irena Cords V, MD, Last Rate: 50 mL/hr at 04/22/23 2115, New Bag at 04/22/23 2115   morphine (PF) 2 MG/ML injection 2 mg, 2 mg, Intravenous, Q2H PRN, Allena Katz, Eliezer Mccoy, MD   ondansetron (ZOFRAN) tablet 4 mg, 4 mg, Oral, Q6H PRN **OR** ondansetron  (ZOFRAN) injection 4 mg, 4 mg, Intravenous, Q6H PRN, Gertha Calkin, MD   pantoprazole (PROTONIX) EC tablet 40 mg, 40 mg, Oral, BID, Gillis Santa, MD, 40 mg at 04/23/23 2039   phenytoin (DILANTIN) ER capsule 400 mg, 400 mg, Oral, QHS, Lucianne Muss, Dileep, MD, 400 mg at 04/23/23 2039   polyethylene glycol (MIRALAX / GLYCOLAX) packet 17 g, 17 g, Oral, BID, Jaynie Collins, DO  lactated ringers 50 mL/hr at 04/22/23 2115    acetaminophen **OR** acetaminophen, hydrALAZINE, morphine injection, ondansetron **OR** ondansetron (ZOFRAN) IV   Objective    Vitals:   04/23/23 1700 04/23/23 2000 04/24/23 0012 04/24/23 0403  BP: 106/69 105/68 117/71 109/76  Pulse:   76 80  Resp:  20 20 19   Temp:  97.6 F (36.4 C) 97.6 F (36.4 C) (!) 97 F (36.1 C)  TempSrc:   Axillary Axillary  SpO2:  98% 98% 95%  Weight:         Physical Exam Vitals and nursing note reviewed.  Constitutional:      General: He is not in acute distress.    Appearance: He is not ill-appearing, toxic-appearing or diaphoretic.  HENT:     Head: Normocephalic and atraumatic.     Nose: Nose normal.     Mouth/Throat:     Mouth: Mucous membranes are moist.     Pharynx: Oropharynx is clear.  Eyes:     General: No scleral icterus.    Extraocular Movements: Extraocular movements intact.  Cardiovascular:     Rate and Rhythm: Normal rate and regular rhythm.     Heart  sounds: Normal heart sounds. No murmur heard.    No friction rub. No gallop.  Pulmonary:     Effort: Pulmonary effort is normal. No respiratory distress.     Breath sounds: Normal breath sounds. No wheezing, rhonchi or rales.  Abdominal:     General: Bowel sounds are normal. There is no distension.     Palpations: Abdomen is soft.     Tenderness: There is no abdominal tenderness. There is no guarding or rebound.  Musculoskeletal:     Cervical back: Neck supple.     Right lower leg: No edema.     Left lower leg: No edema.  Skin:    General: Skin is warm and  dry.     Coloration: Skin is not jaundiced or pale.  Neurological:     Mental Status: He is alert. Mental status is at baseline.      Laboratory Data Recent Labs  Lab 04/19/23 0944 04/19/23 0944 04/19/23 1041 04/21/23 1323 04/21/23 1626 04/21/23 2238 04/22/23 1544 04/23/23 0514 04/24/23 0244  WBC 5.1   < > 5.3 5.7 5.7  --   --  5.2 7.1  HGB 7.4*   < > 7.6* 6.7* 6.9*   < > 7.2* 7.5* 7.1*  HCT 22.2*  --  23.0* 20.1* 20.6*   < > 21.0* 22.8* 20.7*  PLT 337   < > 342 404* 405*  --   --  422* 415*  NEUTOPHILPCT 69  --  68 57  --   --   --   --   --   LYMPHOPCT 16  --  16 24  --   --   --   --   --   MONOPCT 11  --  12 13  --   --   --   --   --   EOSPCT 2  --  2 4  --   --   --   --   --    < > = values in this interval not displayed.   Recent Labs  Lab 04/19/23 0944 04/21/23 1626 04/21/23 2238 04/23/23 0514 04/24/23 0244  NA 137 135  --  138 142  K 3.6 3.9  --  4.0 3.7  CL 106 103  --  106 110  CO2 25 25  --  26 26  BUN 12 11  --  7* 8  CREATININE 0.52* 0.70  --  0.55* 0.65  CALCIUM 8.3* 8.4*  --  8.5* 8.8*  PROT 6.6  --  6.5  --   --   BILITOT 0.3  --  0.6  --   --   ALKPHOS 89  --  93  --   --   ALT 12  --  12  --   --   AST 15  --  13*  --   --   GLUCOSE 98 112*  --  84 110*   Recent Labs  Lab 04/21/23 1852 04/24/23 0244  INR 1.2 1.1      Imaging Studies: CT HEAD WO CONTRAST ( )  Result Date: 04/24/2023 CLINICAL DATA:  Initial evaluation for mental status change, unknown cause. EXAM: CT HEAD WITHOUT CONTRAST TECHNIQUE: Contiguous axial images were obtained from the base of the skull through the vertex without intravenous contrast. RADIATION DOSE REDUCTION: This exam was performed according to the departmental dose-optimization program which includes automated exposure control, adjustment of the mA and/or kV according to patient size and/or use of iterative reconstruction  technique. COMPARISON:  Prior MRI from 02/07/2023. FINDINGS: Brain: Generalized  age-related cerebral atrophy. Multiple chronic infarcts involving the left frontal lobe and bilateral parietal lobes, stable. Probable few small remote lacunar infarcts about the bilateral basal ganglia, also unchanged. No acute intracranial hemorrhage. No acute large vessel territory infarct. No mass lesion, midline shift or mass effect. No hydrocephalus or extra-axial fluid collection. Vascular: No abnormal hyperdense vessel. Scattered calcified atherosclerosis present at the skull base. Skull: Scalp soft tissues demonstrate no acute finding. Calvarium intact. Sinuses/Orbits: Globes orbital soft tissues within normal limits. Paranasal sinuses are clear. No significant mastoid effusion. Other: None. IMPRESSION: 1. No acute intracranial abnormality. 2. Multiple chronic infarcts as above, stable. Electronically Signed   By: Rise Mu M.D.   On: 04/24/2023 02:10    Assessment:   # Symptomatic anemia  # gastirtis and esophagitis - egd 04/23/23  #  phx colon polyps including advanced polyps  # sq cell lung ca on systemic chemotherapy  # CVA- on plavix (currently being held)  Plan:  Low residue diet. Clears tomorrow Plan for colonoscopy Tuesday with 5 day washout of plavix (given h/o polyps, anticipate he will have more) Start golytely prep tmrw Receiving miralx bid right now Continue po ppi bid Continue to hold dvt ppx and plavix  Monitor H&H.  Transfusion and resuscitation as per primary team Avoid frequent lab draws to prevent lab induced anemia Supportive care and antiemetics as per primary team Maintain two sites IV access Avoid nsaids Monitor for GIB.  I personally performed the service.  Management of other medical comorbidities as per primary team  Thank you for allowing [place] to participate in this patient's care. Please don't hesitate to call if any questions or concerns arise.   Jaynie Collins, DO [facility]  Portions of the record may  have been created with voice recognition software. Occasional wrong-word or 'sound-a-like' substitutions may have occurred due to the inherent limitations of voice recognition software.  Read the chart carefully and recognize, using context, where substitutions may have occurred.

## 2023-04-24 NOTE — Progress Notes (Signed)
[facility] Progress Note  Patient: Derek Webb    WUJ:[id number]  DOA: 04/21/2023     Date of Service: the patient was seen and examined on 04/24/2023  Chief Complaint  Patient presents with   Abnormal Lab   Brief hospital course: AVANISH WIEMKEN is a 72 y.o. male with PMH of CVA, seizures, alcohol use, squamous cell carcinoma of right lung, s/p radiation and started Durvalumab on 08/13/2022, plan for total 12 cycles as per oncology.  PET scan was done as an outpatient which showed no recurrence of cancer.  Hypermetabolic incidental left prostate nodule, patient was seen by urologist Dr. Morrie Sheldon, per note low suspicion for prostate cancer.  Recommended to follow-up in 1 year with urologist, ascending thoracic aortic aneurysm 4.4 cm was diagnosed in August 2023, recommended annual follow-up but patient is not aware, patient has follow-up with GI in 4 weeks, as reviewed from EMR, patient was sent by oncologist for anemia.  Hemoglobin dropped from 11-6.7 so patient was sent to ED for further workup and management.  ED workup VS stable, hemoglobin 6.7, CT angio GI bleed: . No CT evidence of a gastrointestinal hemorrhage. Aortic Atherosclerosis NON-VASCULAR: 1. Interval development of a partially visualized indeterminate triangular shaped left upper lobe 3 mm pulmonary nodule morphology is suggestive of an intrapulmonary lymph node. 2. Chronic 2.1 cm left hepatic lobe lesion suggestive of a hepatic hemangioma. 3. Colonic diverticulosis with no acute diverticulitis.  1 unit of PRBC transfusion was given in the ED, GI was consulted.  TR[facility] consulted for admission and further management as below.   Assessment and Plan:  Symptomatic anemia, unknown cause, no overt GI bleeding Hb 6.7-7.8---7.2--7.5 -7.1 1 unit PRBC transfused, 2nd u PRBC transfusion order placed on 9/1 S/p PPI IV, transition to pantoprazole 40 mg p.o. twice daily Monitor H&H and transfuse if hemoglobin less than 7 Hold  Plavix GI consulted s/p EGD found to have mild gastritis, no bleeding.  GI recommended to resume diet, possible colonoscopy on Tuesday after 5 days of Plavix    Iron deficiency, transferrin saturation 12% Start oral iron supplement with vitamin C B12 level 373, goal >400, started oral supplement.   Hypertension S/p  enalapril 20 mg p.o. daily home dose, d/c;d on 8/31 due to low BP  Monitor BP and titrate medications accordingly Use hydralazine as needed   H/o CVA and seizures Continue seizure precaution Continue Dilantin Continue Lipitor and Zetia Hold Plavix for now  Ascending thoracic aortic aneurysm Patient denies any chest pain, it seems stable.  Patient was not aware of aneurysm.  It was diagnosed in August 2023 Patient already received IV contrast for CT abdomen so to avoid excessive contrast I would recommend to follow-up with an outpatient to repeat CTA chest as an outpatient.  GERD on PPI  Squamous cell carcinoma of the lung Continue to follow oncologist as per recommendation and continue chemotherapy  Suspected prostate cancer, recommended to follow with the urologist as an outpatient   Body mass index is 28.62 kg/m.  Interventions:   Consults:  GI Procedures: s/p EGD done on 8/31 Plan for colonoscopy on Tuesday   Diet: Soft diet DVT Prophylaxis: SCD, pharmacological prophylaxis contraindicated due to Anemia     Advance goals of care discussion: Full code  Family Communication: family was  notpresent at bedside, at the time of interview.  The pt provided permission to discuss medical plan with the family. Opportunity was given to ask question and all questions were answered satisfactorily.  Disposition:  Pt is from Home, admitted with Low Hb s/p PRBC, GI consulted, s/p EGD, plan for colonoscopy on Tuesday, which precludes a safe discharge. Discharge to [facility], when stable, most likely on Wednesday after colonoscopy on Tuesday  Subjective: Overnight  patient had an episode of confusion, CT head was negative for any acute findings.  Patient seems to be having off-and-on confusion, with the morning time patient was laying comfortably, denied any complaints.  Patient is cognitively slow at baseline.   Physical Exam: General: NAD, lying comfortably Appear in no distress, affect appropriate Eyes: PERRLA ENT: Oral Mucosa Clear, moist  Neck: no JVD,  Cardiovascular: S1 and S2 Present, no Murmur,  Respiratory: good respiratory effort, Bilateral Air entry equal and Decreased, no Crackles, no wheezes Abdomen: Bowel Sound present, Soft and no tenderness,  Skin: no rashes Extremities: no Pedal edema, no calf tenderness Neurologic: without any new focal findings Gait not checked due to patient safety concerns  Vitals:   04/24/23 1200 04/24/23 1208 04/24/23 1225 04/24/23 1445  BP: (!) 82/67 (!) 82/67 96/66 (!) 86/71  Pulse: 93 91 88 82  Resp: (!) 30 (!) 25 15 (!) 23  Temp: 98.3 F (36.8 C) 98.6 F (37 C) 98.3 F (36.8 C) 98.2 F (36.8 C)  TempSrc: Oral Oral Oral Oral  SpO2: 95% 93% 98% 97%  Weight:        Intake/Output Summary (Last 24 hours) at 04/24/2023 1526 Last data filed at 04/24/2023 1445 Gross per 24 hour  Intake 648.33 ml  Output 900 ml  Net -251.67 ml   Filed Weights   04/21/23 1624 04/21/23 2141  Weight: 84 kg 82.9 kg    Data Reviewed: I have personally reviewed and interpreted daily labs, tele strips, imagings as discussed above. I reviewed all nursing notes, pharmacy notes, vitals, pertinent old records I have discussed plan of care as described above with RN and patient/family.  CBC: Recent Labs  Lab 04/19/23 0944 04/19/23 0944 04/19/23 1041 04/21/23 1323 04/21/23 1626 04/21/23 2238 04/22/23 1544 04/23/23 0514 04/24/23 0244  WBC 5.1  --  5.3 5.7 5.7  --   --  5.2 7.1  NEUTROABS 3.5  --  3.6 3.3  --   --   --   --   --   HGB 7.4*   < > 7.6* 6.7* 6.9* 7.8* 7.2* 7.5* 7.1*  HCT 22.2*  --  23.0* 20.1* 20.6*  22.9* 21.0* 22.8* 20.7*  MCV 91.4  --  92.0 92.6 93.6  --   --  92.3 90.8  PLT 337  --  342 404* 405*  --   --  422* 415*   < > = values in this interval not displayed.   Basic Metabolic Panel: Recent Labs  Lab 04/19/23 0944 04/21/23 1626 04/23/23 0514 04/24/23 0244  NA 137 135 138 142  K 3.6 3.9 4.0 3.7  CL 106 103 106 110  CO2 25 25 26 26   GLUCOSE 98 112* 84 110*  BUN 12 11 7* 8  CREATININE 0.52* 0.70 0.55* 0.65  CALCIUM 8.3* 8.4* 8.5* 8.8*  MG  --   --  2.0  --   PHOS  --   --  3.1  --     Studies: CT HEAD WO CONTRAST ( )  Result Date: 04/24/2023 CLINICAL DATA:  Initial evaluation for mental status change, unknown cause. EXAM: CT HEAD WITHOUT CONTRAST TECHNIQUE: Contiguous axial images were obtained from the base of the skull through the vertex without intravenous  contrast. RADIATION DOSE REDUCTION: This exam was performed according to the departmental dose-optimization program which includes automated exposure control, adjustment of the mA and/or kV according to patient size and/or use of iterative reconstruction technique. COMPARISON:  Prior MRI from 02/07/2023. FINDINGS: Brain: Generalized age-related cerebral atrophy. Multiple chronic infarcts involving the left frontal lobe and bilateral parietal lobes, stable. Probable few small remote lacunar infarcts about the bilateral basal ganglia, also unchanged. No acute intracranial hemorrhage. No acute large vessel territory infarct. No mass lesion, midline shift or mass effect. No hydrocephalus or extra-axial fluid collection. Vascular: No abnormal hyperdense vessel. Scattered calcified atherosclerosis present at the skull base. Skull: Scalp soft tissues demonstrate no acute finding. Calvarium intact. Sinuses/Orbits: Globes orbital soft tissues within normal limits. Paranasal sinuses are clear. No significant mastoid effusion. Other: None. IMPRESSION: 1. No acute intracranial abnormality. 2. Multiple chronic infarcts as above, stable.  Electronically Signed   By: Rise Mu M.D.   On: 04/24/2023 02:10    Scheduled Meds:  vitamin C  500 mg Oral Daily   atorvastatin  80 mg Oral Daily   vitamin B-12  1,000 mcg Oral Daily   ezetimibe  10 mg Oral Daily   fluticasone furoate-vilanterol  1 puff Inhalation Daily   iron polysaccharides  150 mg Oral Daily   pantoprazole  40 mg Oral BID   phenytoin  400 mg Oral QHS   polyethylene glycol  17 g Oral BID   Continuous Infusions:  lactated ringers 50 mL/hr at 04/22/23 2115   PRN Meds: acetaminophen **OR** acetaminophen, hydrALAZINE, morphine injection, ondansetron **OR** ondansetron (ZOFRAN) IV  Time spent: 35 minutes  Author: Gillis Santa. MD [facility] 04/24/2023 3:26 PM  To reach On-call, see care teams to locate the attending and reach out to them via [email or web address]. If 7PM-7AM, please contact night-coverage If you still have difficulty reaching the attending provider, please page the [facility] (Director on Call) for [facility] on amion for assistance.

## 2023-04-24 NOTE — Progress Notes (Signed)
CROSS COVER NOTE  NAME: CAVION RIDLEY MRN: [id number] DOB : 03-01-1951    Concern as stated by nurse / staff   Paged for patient with acute confusion, sudden change from prior baseline per nurses caring for patient past 1 to 2 days Patient has been trying to get out of bed triggering the bed alarm and seems very confused    Pertinent findings on chart review: Last progress note reviewed: Patient with history of CVA and seizures, lung cancer on chemo among other chronic illnesses, admitted for symptomatic anemia which is now stable  No seizure activity noted by nurse  Assessment Upon my arrival, patient appears restless, sitting up then lying back down then rolling to each side, trying to climb over side of bed triggering bed alarm.  He is awake and alert but disoriented and is unable to state year or why he is here.    04/24/2023   12:12 AM 04/23/2023    8:00 PM 04/23/2023    5:00 PM  Vitals with BMI  Systolic 117 105 962  Diastolic 71 68 69  Pulse 76    Physical Exam Vitals and nursing note reviewed.  Constitutional:      General: He is not in acute distress.    Comments: Restless  HENT:     Head: Normocephalic and atraumatic.  Cardiovascular:     Rate and Rhythm: Normal rate and regular rhythm.     Heart sounds: Normal heart sounds.  Pulmonary:     Effort: Pulmonary effort is normal.     Breath sounds: Normal breath sounds.  Abdominal:     Palpations: Abdomen is soft.     Tenderness: There is no abdominal tenderness.  Neurological:     Mental Status: He is alert. He is disoriented.     Cranial Nerves: No cranial nerve deficit.     Motor: No weakness.  Psychiatric:        Mood and Affect: Mood is anxious.        Behavior: Behavior is hyperactive. Behavior is cooperative.       Assessment and  Interventions   Assessment:  Altered mental status/restlessness and confusion, suspect delirium Stable anemia History of stroke and seizure  Plan: : Spoke  with sister who stated that this is not usual behavior for him but she did not seem surprised saying that he was in a new place.  She states that she does not stay with him so she does not know how he is at nights.  She did see him earlier and he communicated well with her which is his baseline Exam is nonfocal also stroke not suspected Adult postictal confusion as no seizure activity witnessed Trial of Ativan 0.5 Will send for CT head H&H has been stable but will get early labs, CBC and BMP, dimer and INR    workup CT head IMPRESSION: 1. No acute intracranial abnormality. 2. Multiple chronic infarcts as above, stable.     Latest Ref Rng & Units 04/24/2023    2:44 AM 04/23/2023    5:14 AM 04/22/2023    3:44 PM  CBC  WBC 4.0 - 10.5 K/uL 7.1  5.2    Hemoglobin 13.0 - 17.0 g/dL 7.1  7.5  7.2   Hematocrit 39.0 - 52.0 % 20.7  22.8  21.0   Platelets 150 - 400 K/uL 415  422        Latest Ref Rng & Units 04/24/2023    2:44  AM 04/23/2023    5:14 AM 04/21/2023    4:26 PM  BMP  Glucose 70 - 99 mg/dL 161  84  096   BUN 8 - 23 mg/dL 8  7  11    Creatinine 0.61 - 1.24 mg/dL 0.45  4.09  8.11   Sodium 135 - 145 mmol/L 142  138  135   Potassium 3.5 - 5.1 mmol/L 3.7  4.0  3.9   Chloride 98 - 111 mmol/L 110  106  103   CO2 22 - 32 mmol/L 26  26  25    Calcium 8.9 - 10.3 mg/dL 8.8  8.5  8.4    Repeat CBC in 6 hours     CRITICAL CARE Performed by: Andris Baumann   Total critical care time: 45 minutes  Critical care time was exclusive of separately billable procedures and treating other patients.  Critical care was necessary to treat or prevent imminent or life-threatening deterioration.  Critical care was time spent personally by me on the following activities: development of treatment plan with patient and/or surrogate as well as nursing, discussions with consultants, evaluation of patient's response to treatment, examination of patient, obtaining history from patient or surrogate, ordering and  performing treatments and interventions, ordering and review of laboratory studies, ordering and review of radiographic studies, pulse oximetry and re-evaluation of patient's condition.

## 2023-04-24 NOTE — Progress Notes (Signed)
Dr.Duncan paged around midnight due to pt increasingly confused and trying to exit bed. Pt hard to re-direct. Pt able to state his name, recall who president and state his sisters name Derek Webb) but unable to tell [place] his birthday, current year or where he is at.  Para March, MD at bedside and CT head ordered-Pt's sister Derek Webb called by RN and MD to inquire if this is a new issue-Sister stated that as far as she knows, this is new and unlike him.  Will continue to monitor. Derek Webb S Derek Webb

## 2023-04-24 NOTE — Plan of Care (Signed)
  Problem: Clinical Measurements: Goal: Respiratory complications will improve Outcome: Progressing   Problem: Activity: Goal: Risk for activity intolerance will decrease Outcome: Progressing   Problem: Nutrition: Goal: Adequate nutrition will be maintained Outcome: Progressing   Problem: Elimination: Goal: Will not experience complications related to bowel motility Outcome: Progressing Goal: Will not experience complications related to urinary retention Outcome: Progressing   Problem: Pain Managment: Goal: General experience of comfort will improve Outcome: Progressing   Problem: Skin Integrity: Goal: Risk for impaired skin integrity will decrease Outcome: Progressing   Problem: Education: Goal: Knowledge of General Education information will improve Description: Including pain rating scale, medication(s)/side effects and non-pharmacologic comfort measures Outcome: Not Progressing   Problem: Health Behavior/Discharge Planning: Goal: Ability to manage health-related needs will improve Outcome: Not Progressing   Problem: Coping: Goal: Level of anxiety will decrease Outcome: Not Progressing

## 2023-04-25 DIAGNOSIS — D649 Anemia, unspecified: Secondary | ICD-10-CM | POA: Diagnosis not present

## 2023-04-25 LAB — BASIC METABOLIC PANEL
Anion gap: 6 (ref 5–15)
BUN: 5 mg/dL — ABNORMAL LOW (ref 8–23)
CO2: 27 mmol/L (ref 22–32)
Calcium: 8.6 mg/dL — ABNORMAL LOW (ref 8.9–10.3)
Chloride: 107 mmol/L (ref 98–111)
Creatinine, Ser: 0.65 mg/dL (ref 0.61–1.24)
GFR, Estimated: >60 mL/min (ref >60)
Glucose, Bld: 84 mg/dL (ref 70–99)
Potassium: 3.5 mmol/L (ref 3.5–5.1)
Sodium: 140 mmol/L (ref 135–145)

## 2023-04-25 LAB — CBC
HCT: 26.6 % — ABNORMAL LOW (ref 39.0–52.0)
Hemoglobin: 8.8 g/dL — ABNORMAL LOW (ref 13.0–17.0)
MCH: 29.6 pg (ref 26.0–34.0)
MCHC: 33.1 g/dL (ref 30.0–36.0)
MCV: 89.6 fL (ref 80.0–100.0)
Platelets: 446 10*3/uL — ABNORMAL HIGH (ref 150–400)
RBC: 2.97 MIL/uL — ABNORMAL LOW (ref 4.22–5.81)
RDW: 19.1 % — ABNORMAL HIGH (ref 11.5–15.5)
WBC: 7.5 10*3/uL (ref 4.0–10.5)
nRBC: 0 % (ref 0.0–0.2)

## 2023-04-25 LAB — BPAM RBC
Blood Product Expiration Date: 202409202359
Blood Product Expiration Date: 202409292359
ISSUE DATE / TIME: 202408291739
ISSUE DATE / TIME: 202409011153
Unit Type and Rh: 5100
Unit Type and Rh: 5100

## 2023-04-25 LAB — TYPE AND SCREEN
ABO/RH(D): O POS
Antibody Screen: NEGATIVE
Unit division: 0
Unit division: 0

## 2023-04-25 LAB — PHOSPHORUS: Phosphorus: 3 mg/dL (ref 2.5–4.6)

## 2023-04-25 LAB — MAGNESIUM: Magnesium: 1.7 mg/dL (ref 1.7–2.4)

## 2023-04-25 MED ORDER — BISACODYL 5 MG PO TBEC
10.0000 mg | DELAYED_RELEASE_TABLET | Freq: Once | ORAL | Status: AC
  Administered 2023-04-25: 10 mg via ORAL
  Filled 2023-04-25: qty 2

## 2023-04-25 MED ORDER — BISACODYL 5 MG PO TBEC
10.0000 mg | DELAYED_RELEASE_TABLET | Freq: Once | ORAL | Status: AC
  Filled 2023-04-25: qty 2

## 2023-04-25 MED ORDER — PEG 3350-KCL-NA BICARB-NACL 420 G PO SOLR: 2000.0000 mL | Freq: Once | ORAL | Status: DC | PRN

## 2023-04-25 MED ORDER — PEG 3350-KCL-NA BICARB-NACL 420 G PO SOLR
4000.0000 mL | Freq: Once | ORAL | Status: AC
  Administered 2023-04-25: 4000 mL via ORAL
  Filled 2023-04-25: qty 4000

## 2023-04-25 NOTE — Care Management Important Message (Signed)
Important Message  Patient Details  Name: AYDAN ZAGORSKI MRN: [id number] Date of Birth: [birth year]   Medicare Important Message Given:  Yes     [name] 04/25/2023, 12:43 PM

## 2023-04-25 NOTE — H&P (View-Only) (Signed)
Inpatient Follow-up/Progress Note   Patient ID: Derek Webb is a 72 y.o. male.  Overnight Events / Subjective Findings NAEON. Pt more interactive today. Plan for colonoscopy tomorrow. Clear liquids today with npo at midnight. No abdominal pain. Reports one small bowel movement yesterday. Hgb improved from 7.1 to 8.8 after 1 u prbc Denies pain. No other acute GI complaints.  Review of Systems  Constitutional:  Negative for appetite change.  Gastrointestinal:  Negative for abdominal distention, abdominal pain, anal bleeding, blood in stool, constipation, diarrhea, nausea, rectal pain and vomiting.  Psychiatric/Behavioral:  Positive for confusion.      Medications  Current Facility-Administered Medications:    acetaminophen (TYLENOL) tablet 650 mg, 650 mg, Oral, Q6H PRN, 650 mg at 04/24/23 2034 **OR** acetaminophen (TYLENOL) suppository 650 mg, 650 mg, Rectal, Q6H PRN, Gertha Calkin, MD   ascorbic acid (VITAMIN C) tablet 500 mg, 500 mg, Oral, Daily, Gillis Santa, MD, 500 mg at 04/25/23 0958   atorvastatin (LIPITOR) tablet 80 mg, 80 mg, Oral, Daily, Gillis Santa, MD, 80 mg at 04/25/23 0958   bisacodyl (DULCOLAX) EC tablet 10 mg, 10 mg, Oral, Once, Jaynie Collins, DO   cyanocobalamin (VITAMIN B12) tablet 1,000 mcg, 1,000 mcg, Oral, Daily, Gillis Santa, MD, 1,000 mcg at 04/25/23 2841   ezetimibe (ZETIA) tablet 10 mg, 10 mg, Oral, Daily, Gillis Santa, MD, 10 mg at 04/25/23 1005   fluticasone furoate-vilanterol (BREO ELLIPTA) 200-25 MCG/ACT 1 puff, 1 puff, Inhalation, Daily, Gillis Santa, MD, 1 puff at 04/25/23 1001   hydrALAZINE (APRESOLINE) injection 5 mg, 5 mg, Intravenous, Q4H PRN, Gertha Calkin, MD   iron polysaccharides (NIFEREX) capsule 150 mg, 150 mg, Oral, Daily, Gillis Santa, MD, 150 mg at 04/25/23 1005   lactated ringers infusion, , Intravenous, Continuous, Irena Cords V, MD, Last Rate: 50 mL/hr at 04/25/23 0559, New Bag at 04/25/23 0559   morphine (PF) 2 MG/ML  injection 2 mg, 2 mg, Intravenous, Q2H PRN, Allena Katz, Eliezer Mccoy, MD   ondansetron (ZOFRAN) tablet 4 mg, 4 mg, Oral, Q6H PRN **OR** ondansetron (ZOFRAN) injection 4 mg, 4 mg, Intravenous, Q6H PRN, Gertha Calkin, MD   pantoprazole (PROTONIX) EC tablet 40 mg, 40 mg, Oral, BID, Gillis Santa, MD, 40 mg at 04/25/23 3244   phenytoin (DILANTIN) ER capsule 400 mg, 400 mg, Oral, QHS, Gillis Santa, MD, 400 mg at 04/24/23 2034   polyethylene glycol-electrolytes (NuLYTELY) solution 2,000 mL, 2,000 mL, Oral, Once PRN, Jaynie Collins, DO   polyethylene glycol-electrolytes (NuLYTELY) solution 4,000 mL, 4,000 mL, Oral, Once, Jaynie Collins, DO   traZODone (DESYREL) tablet 50 mg, 50 mg, Oral, QHS PRN, Lindajo Royal V, MD, 50 mg at 04/24/23 2033  lactated ringers 50 mL/hr at 04/25/23 0559    acetaminophen **OR** acetaminophen, hydrALAZINE, morphine injection, ondansetron **OR** ondansetron (ZOFRAN) IV, polyethylene glycol-electrolytes, traZODone   Objective    Vitals:   04/24/23 2349 04/25/23 0509 04/25/23 0750 04/25/23 1133  BP:  117/75 116/69 107/68  Pulse:  71 84 82  Resp:  20 18 20   Temp: 98.4 F (36.9 C) 98.5 F (36.9 C) 97.9 F (36.6 C) 98.1 F (36.7 C)  TempSrc: Oral Oral    SpO2:  98% 94% 97%  Weight:         Physical Exam Vitals and nursing note reviewed.  Constitutional:      General: He is not in acute distress.    Appearance: He is not ill-appearing, toxic-appearing or diaphoretic.  HENT:  Head: Normocephalic and atraumatic.     Nose: Nose normal.     Mouth/Throat:     Mouth: Mucous membranes are moist.     Pharynx: Oropharynx is clear.  Eyes:     General: No scleral icterus.    Extraocular Movements: Extraocular movements intact.  Cardiovascular:     Rate and Rhythm: Normal rate and regular rhythm.     Heart sounds: Normal heart sounds. No murmur heard.    No friction rub. No gallop.  Pulmonary:     Effort: Pulmonary effort is normal. No respiratory distress.      Breath sounds: Normal breath sounds. No wheezing, rhonchi or rales.  Abdominal:     General: Bowel sounds are normal. There is no distension.     Palpations: Abdomen is soft.     Tenderness: There is no abdominal tenderness. There is no guarding or rebound.  Musculoskeletal:     Cervical back: Neck supple.     Right lower leg: No edema.     Left lower leg: No edema.  Skin:    General: Skin is warm and dry.     Coloration: Skin is not jaundiced or pale.  Neurological:     Mental Status: He is alert. Mental status is at baseline.      Laboratory Data Recent Labs  Lab 04/19/23 0944 04/19/23 0944 04/19/23 1041 04/21/23 1323 04/21/23 1626 04/23/23 0514 04/24/23 0244 04/24/23 1649 04/25/23 1225  WBC 5.1   < > 5.3 5.7   < > 5.2 7.1  --  7.5  HGB 7.4*   < > 7.6* 6.7*   < > 7.5* 7.1* 8.1* 8.8*  HCT 22.2*  --  23.0* 20.1*   < > 22.8* 20.7* 23.6* 26.6*  PLT 337   < > 342 404*   < > 422* 415*  --  446*  NEUTOPHILPCT 69  --  68 57  --   --   --   --   --   LYMPHOPCT 16  --  16 24  --   --   --   --   --   MONOPCT 11  --  12 13  --   --   --   --   --   EOSPCT 2  --  2 4  --   --   --   --   --    < > = values in this interval not displayed.   Recent Labs  Lab 04/19/23 0944 04/21/23 1626 04/21/23 2238 04/23/23 0514 04/24/23 0244 04/25/23 1225  NA 137   < >  --  138 142 140  K 3.6   < >  --  4.0 3.7 3.5  CL 106   < >  --  106 110 107  CO2 25   < >  --  26 26 27   BUN 12   < >  --  7* 8 5*  CREATININE 0.52*   < >  --  0.55* 0.65 0.65  CALCIUM 8.3*   < >  --  8.5* 8.8* 8.6*  PROT 6.6  --  6.5  --   --   --   BILITOT 0.3  --  0.6  --   --   --   ALKPHOS 89  --  93  --   --   --   ALT 12  --  12  --   --   --   AST 15  --  13*  --   --   --   GLUCOSE 98   < >  --  84 110* 84   < > = values in this interval not displayed.   Recent Labs  Lab 04/21/23 1852 04/24/23 0244  INR 1.2 1.1      Imaging Studies: CT HEAD WO CONTRAST ( )  Result Date:  04/24/2023 CLINICAL DATA:  Initial evaluation for mental status change, unknown cause. EXAM: CT HEAD WITHOUT CONTRAST TECHNIQUE: Contiguous axial images were obtained from the base of the skull through the vertex without intravenous contrast. RADIATION DOSE REDUCTION: This exam was performed according to the departmental dose-optimization program which includes automated exposure control, adjustment of the mA and/or kV according to patient size and/or use of iterative reconstruction technique. COMPARISON:  Prior MRI from 02/07/2023. FINDINGS: Brain: Generalized age-related cerebral atrophy. Multiple chronic infarcts involving the left frontal lobe and bilateral parietal lobes, stable. Probable few small remote lacunar infarcts about the bilateral basal ganglia, also unchanged. No acute intracranial hemorrhage. No acute large vessel territory infarct. No mass lesion, midline shift or mass effect. No hydrocephalus or extra-axial fluid collection. Vascular: No abnormal hyperdense vessel. Scattered calcified atherosclerosis present at the skull base. Skull: Scalp soft tissues demonstrate no acute finding. Calvarium intact. Sinuses/Orbits: Globes orbital soft tissues within normal limits. Paranasal sinuses are clear. No significant mastoid effusion. Other: None. IMPRESSION: 1. No acute intracranial abnormality. 2. Multiple chronic infarcts as above, stable. Electronically Signed   By: Rise Mu M.D.   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 OK to drink after NPO order.  Completely finish 1st jug. If bm are not clear or  translucent yellow after 1st jug, begin second (ordered prn)  Labs in am- bmp, cbc Protonix 40 mg po bid Hold dvt ppx Plavix on hold- tmrw will be 5 day washout Monitor H&H.  Transfusion and resuscitation as per primary team Avoid frequent lab draws to prevent lab induced anemia Supportive care and antiemetics as per primary team Maintain two sites IV access Avoid nsaids Monitor for GIB.  Further recommendations pending colonoscopy  Colonoscopy with possible biopsy, control of bleeding, polypectomy, and interventions as necessary has been discussed with the patient/patient representative. Informed consent was obtained from the patient/patient representative after explaining the indication, nature, and risks of the procedure including but not limited to death, bleeding, perforation, missed neoplasm/lesions, cardiorespiratory compromise, and reaction to medications. Opportunity for questions was given and appropriate answers were provided. Patient/patient representative has verbalized understanding is amenable to undergoing the procedure.  I personally performed the service.  Management of other medical comorbidities as per primary team  Thank you for allowing [place] to participate in this patient's care. Please don't hesitate to call if any questions or concerns arise.   Jaynie Collins, DO [facility]  Portions of the record may have been created with voice recognition software. Occasional wrong-word or 'sound-a-like' substitutions may have occurred due to the inherent limitations of voice recognition software.  Read the chart carefully and recognize, using context, where substitutions may have occurred.

## 2023-04-25 NOTE — Progress Notes (Signed)
Inpatient Follow-up/Progress Note   Patient ID: Derek Webb is a 72 y.o. male.  Overnight Events / Subjective Findings NAEON. Pt more interactive today. Plan for colonoscopy tomorrow. Clear liquids today with npo at midnight. No abdominal pain. Reports one small bowel movement yesterday. Hgb improved from 7.1 to 8.8 after 1 u prbc Denies pain. No other acute GI complaints.  Review of Systems  Constitutional:  Negative for appetite change.  Gastrointestinal:  Negative for abdominal distention, abdominal pain, anal bleeding, blood in stool, constipation, diarrhea, nausea, rectal pain and vomiting.  Psychiatric/Behavioral:  Positive for confusion.      Medications  Current Facility-Administered Medications:    acetaminophen (TYLENOL) tablet 650 mg, 650 mg, Oral, Q6H PRN, 650 mg at 04/24/23 2034 **OR** acetaminophen (TYLENOL) suppository 650 mg, 650 mg, Rectal, Q6H PRN, Gertha Calkin, MD   ascorbic acid (VITAMIN C) tablet 500 mg, 500 mg, Oral, Daily, Gillis Santa, MD, 500 mg at 04/25/23 0958   atorvastatin (LIPITOR) tablet 80 mg, 80 mg, Oral, Daily, Gillis Santa, MD, 80 mg at 04/25/23 0958   bisacodyl (DULCOLAX) EC tablet 10 mg, 10 mg, Oral, Once, Jaynie Collins, DO   cyanocobalamin (VITAMIN B12) tablet 1,000 mcg, 1,000 mcg, Oral, Daily, Gillis Santa, MD, 1,000 mcg at 04/25/23 2841   ezetimibe (ZETIA) tablet 10 mg, 10 mg, Oral, Daily, Gillis Santa, MD, 10 mg at 04/25/23 1005   fluticasone furoate-vilanterol (BREO ELLIPTA) 200-25 MCG/ACT 1 puff, 1 puff, Inhalation, Daily, Gillis Santa, MD, 1 puff at 04/25/23 1001   hydrALAZINE (APRESOLINE) injection 5 mg, 5 mg, Intravenous, Q4H PRN, Gertha Calkin, MD   iron polysaccharides (NIFEREX) capsule 150 mg, 150 mg, Oral, Daily, Gillis Santa, MD, 150 mg at 04/25/23 1005   lactated ringers infusion, , Intravenous, Continuous, Irena Cords V, MD, Last Rate: 50 mL/hr at 04/25/23 0559, New Bag at 04/25/23 0559   morphine (PF) 2 MG/ML  injection 2 mg, 2 mg, Intravenous, Q2H PRN, Allena Katz, Eliezer Mccoy, MD   ondansetron (ZOFRAN) tablet 4 mg, 4 mg, Oral, Q6H PRN **OR** ondansetron (ZOFRAN) injection 4 mg, 4 mg, Intravenous, Q6H PRN, Gertha Calkin, MD   pantoprazole (PROTONIX) EC tablet 40 mg, 40 mg, Oral, BID, Gillis Santa, MD, 40 mg at 04/25/23 3244   phenytoin (DILANTIN) ER capsule 400 mg, 400 mg, Oral, QHS, Gillis Santa, MD, 400 mg at 04/24/23 2034   polyethylene glycol-electrolytes (NuLYTELY) solution 2,000 mL, 2,000 mL, Oral, Once PRN, Jaynie Collins, DO   polyethylene glycol-electrolytes (NuLYTELY) solution 4,000 mL, 4,000 mL, Oral, Once, Jaynie Collins, DO   traZODone (DESYREL) tablet 50 mg, 50 mg, Oral, QHS PRN, Lindajo Royal V, MD, 50 mg at 04/24/23 2033  lactated ringers 50 mL/hr at 04/25/23 0559    acetaminophen **OR** acetaminophen, hydrALAZINE, morphine injection, ondansetron **OR** ondansetron (ZOFRAN) IV, polyethylene glycol-electrolytes, traZODone   Objective    Vitals:   04/24/23 2349 04/25/23 0509 04/25/23 0750 04/25/23 1133  BP:  117/75 116/69 107/68  Pulse:  71 84 82  Resp:  20 18 20   Temp: 98.4 F (36.9 C) 98.5 F (36.9 C) 97.9 F (36.6 C) 98.1 F (36.7 C)  TempSrc: Oral Oral    SpO2:  98% 94% 97%  Weight:         Physical Exam Vitals and nursing note reviewed.  Constitutional:      General: He is not in acute distress.    Appearance: He is not ill-appearing, toxic-appearing or diaphoretic.  HENT:  Head: Normocephalic and atraumatic.     Nose: Nose normal.     Mouth/Throat:     Mouth: Mucous membranes are moist.     Pharynx: Oropharynx is clear.  Eyes:     General: No scleral icterus.    Extraocular Movements: Extraocular movements intact.  Cardiovascular:     Rate and Rhythm: Normal rate and regular rhythm.     Heart sounds: Normal heart sounds. No murmur heard.    No friction rub. No gallop.  Pulmonary:     Effort: Pulmonary effort is normal. No respiratory distress.      Breath sounds: Normal breath sounds. No wheezing, rhonchi or rales.  Abdominal:     General: Bowel sounds are normal. There is no distension.     Palpations: Abdomen is soft.     Tenderness: There is no abdominal tenderness. There is no guarding or rebound.  Musculoskeletal:     Cervical back: Neck supple.     Right lower leg: No edema.     Left lower leg: No edema.  Skin:    General: Skin is warm and dry.     Coloration: Skin is not jaundiced or pale.  Neurological:     Mental Status: He is alert. Mental status is at baseline.      Laboratory Data Recent Labs  Lab 04/19/23 0944 04/19/23 0944 04/19/23 1041 04/21/23 1323 04/21/23 1626 04/23/23 0514 04/24/23 0244 04/24/23 1649 04/25/23 1225  WBC 5.1   < > 5.3 5.7   < > 5.2 7.1  --  7.5  HGB 7.4*   < > 7.6* 6.7*   < > 7.5* 7.1* 8.1* 8.8*  HCT 22.2*  --  23.0* 20.1*   < > 22.8* 20.7* 23.6* 26.6*  PLT 337   < > 342 404*   < > 422* 415*  --  446*  NEUTOPHILPCT 69  --  68 57  --   --   --   --   --   LYMPHOPCT 16  --  16 24  --   --   --   --   --   MONOPCT 11  --  12 13  --   --   --   --   --   EOSPCT 2  --  2 4  --   --   --   --   --    < > = values in this interval not displayed.   Recent Labs  Lab 04/19/23 0944 04/21/23 1626 04/21/23 2238 04/23/23 0514 04/24/23 0244 04/25/23 1225  NA 137   < >  --  138 142 140  K 3.6   < >  --  4.0 3.7 3.5  CL 106   < >  --  106 110 107  CO2 25   < >  --  26 26 27   BUN 12   < >  --  7* 8 5*  CREATININE 0.52*   < >  --  0.55* 0.65 0.65  CALCIUM 8.3*   < >  --  8.5* 8.8* 8.6*  PROT 6.6  --  6.5  --   --   --   BILITOT 0.3  --  0.6  --   --   --   ALKPHOS 89  --  93  --   --   --   ALT 12  --  12  --   --   --   AST 15  --  13*  --   --   --   GLUCOSE 98   < >  --  84 110* 84   < > = values in this interval not displayed.   Recent Labs  Lab 04/21/23 1852 04/24/23 0244  INR 1.2 1.1      Imaging Studies: CT HEAD WO CONTRAST ( )  Result Date:  04/24/2023 CLINICAL DATA:  Initial evaluation for mental status change, unknown cause. EXAM: CT HEAD WITHOUT CONTRAST TECHNIQUE: Contiguous axial images were obtained from the base of the skull through the vertex without intravenous contrast. RADIATION DOSE REDUCTION: This exam was performed according to the departmental dose-optimization program which includes automated exposure control, adjustment of the mA and/or kV according to patient size and/or use of iterative reconstruction technique. COMPARISON:  Prior MRI from 02/07/2023. FINDINGS: Brain: Generalized age-related cerebral atrophy. Multiple chronic infarcts involving the left frontal lobe and bilateral parietal lobes, stable. Probable few small remote lacunar infarcts about the bilateral basal ganglia, also unchanged. No acute intracranial hemorrhage. No acute large vessel territory infarct. No mass lesion, midline shift or mass effect. No hydrocephalus or extra-axial fluid collection. Vascular: No abnormal hyperdense vessel. Scattered calcified atherosclerosis present at the skull base. Skull: Scalp soft tissues demonstrate no acute finding. Calvarium intact. Sinuses/Orbits: Globes orbital soft tissues within normal limits. Paranasal sinuses are clear. No significant mastoid effusion. Other: None. IMPRESSION: 1. No acute intracranial abnormality. 2. Multiple chronic infarcts as above, stable. Electronically Signed   By: Rise Mu M.D.   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 OK to drink after NPO order.  Completely finish 1st jug. If bm are not clear or  translucent yellow after 1st jug, begin second (ordered prn)  Labs in am- bmp, cbc Protonix 40 mg po bid Hold dvt ppx Plavix on hold- tmrw will be 5 day washout Monitor H&H.  Transfusion and resuscitation as per primary team Avoid frequent lab draws to prevent lab induced anemia Supportive care and antiemetics as per primary team Maintain two sites IV access Avoid nsaids Monitor for GIB.  Further recommendations pending colonoscopy  Colonoscopy with possible biopsy, control of bleeding, polypectomy, and interventions as necessary has been discussed with the patient/patient representative. Informed consent was obtained from the patient/patient representative after explaining the indication, nature, and risks of the procedure including but not limited to death, bleeding, perforation, missed neoplasm/lesions, cardiorespiratory compromise, and reaction to medications. Opportunity for questions was given and appropriate answers were provided. Patient/patient representative has verbalized understanding is amenable to undergoing the procedure.  I personally performed the service.  Management of other medical comorbidities as per primary team  Thank you for allowing [place] to participate in this patient's care. Please don't hesitate to call if any questions or concerns arise.   Jaynie Collins, DO [facility]  Portions of the record may have been created with voice recognition software. Occasional wrong-word or 'sound-a-like' substitutions may have occurred due to the inherent limitations of voice recognition software.  Read the chart carefully and recognize, using context, where substitutions may have occurred.

## 2023-04-25 NOTE — Progress Notes (Signed)
[facility] Progress Note  Patient: Derek Webb    TIR:[id number]  DOA: 04/21/2023     Date of Service: the patient was seen and examined on 04/25/2023  Chief Complaint  Patient presents with   Abnormal Lab   Brief hospital course: Derek Webb is a 72 y.o. male with PMH of CVA, seizures, alcohol use, squamous cell carcinoma of right lung, s/p radiation and started Durvalumab on 08/13/2022, plan for total 12 cycles as per oncology.  PET scan was done as an outpatient which showed no recurrence of cancer.  Hypermetabolic incidental left prostate nodule, patient was seen by urologist Dr. Morrie Sheldon, per note low suspicion for prostate cancer.  Recommended to follow-up in 1 year with urologist, ascending thoracic aortic aneurysm 4.4 cm was diagnosed in August 2023, recommended annual follow-up but patient is not aware, patient has follow-up with GI in 4 weeks, as reviewed from EMR, patient was sent by oncologist for anemia.  Hemoglobin dropped from 11-6.7 so patient was sent to ED for further workup and management.  ED workup VS stable, hemoglobin 6.7, CT angio GI bleed: . No CT evidence of a gastrointestinal hemorrhage. Aortic Atherosclerosis NON-VASCULAR: 1. Interval development of a partially visualized indeterminate triangular shaped left upper lobe 3 mm pulmonary nodule morphology is suggestive of an intrapulmonary lymph node. 2. Chronic 2.1 cm left hepatic lobe lesion suggestive of a hepatic hemangioma. 3. Colonic diverticulosis with no acute diverticulitis.  1 unit of PRBC transfusion was given in the ED, GI was consulted.  TR[facility] consulted for admission and further management as below.   Assessment and Plan:  Symptomatic anemia, unknown cause, no overt GI bleeding Hb 6.7-7.8---7.2--7.5 -7.1--8.1 1 unit PRBC transfused, 2nd u PRBC transfusion given on 9/1 S/p PPI IV, transition to pantoprazole 40 mg p.o. twice daily Monitor H&H and transfuse if hemoglobin less than 7 Hold  Plavix GI consulted s/p EGD found to have mild gastritis, no bleeding.  GI recommended to resume diet, possible colonoscopy on Tuesday after 5 days of Plavix  9/2 clear liquid diet today, started GoLytely prep for colonoscopy tomorrow a.m. Keep n.p.o. after midnight.   Iron deficiency, transferrin saturation 12% Start oral iron supplement with vitamin C B12 level 373, goal >400, started oral supplement.   Hypertension S/p  enalapril 20 mg p.o. daily home dose, d/c;d on 8/31 due to low BP  Monitor BP and titrate medications accordingly Use hydralazine as needed   H/o CVA and seizures Continue seizure precaution Continue Dilantin Continue Lipitor and Zetia Hold Plavix for now  Ascending thoracic aortic aneurysm Patient denies any chest pain, it seems stable.  Patient was not aware of aneurysm.  It was diagnosed in August 2023 Patient already received IV contrast for CT abdomen so to avoid excessive contrast I would recommend to follow-up with an outpatient to repeat CTA chest as an outpatient.  GERD on PPI  Squamous cell carcinoma of the lung Continue to follow oncologist as per recommendation and continue chemotherapy  Suspected prostate cancer, recommended to follow with the urologist as an outpatient   Body mass index is 28.62 kg/m.  Interventions:   Consults:  GI Procedures: s/p EGD done on 8/31 Plan for colonoscopy on Tuesday   Diet: Soft diet DVT Prophylaxis: SCD, pharmacological prophylaxis contraindicated due to Anemia     Advance goals of care discussion: Full code  Family Communication: family was  notpresent at bedside, at the time of interview.  The pt provided permission to discuss medical plan with  the family. Opportunity was given to ask question and all questions were answered satisfactorily.   Disposition:  Pt is from Home, admitted with Low Hb s/p PRBC, GI consulted, s/p EGD, plan for colonoscopy on Tuesday, which precludes a safe  discharge. Discharge to [facility], when stable, most likely on Wednesday after colonoscopy on Tuesday  Subjective: No significant events overnight, patient was awake and alert, denied any complaint.  Patient knew that he is on clear liquid diet today and colonoscopy will be done tomorrow a.m.  Physical Exam: General: NAD, lying comfortably Appear in no distress, affect appropriate Eyes: PERRLA ENT: Oral Mucosa Clear, moist  Neck: no JVD,  Cardiovascular: S1 and S2 Present, no Murmur,  Respiratory: good respiratory effort, Bilateral Air entry equal and Decreased, no Crackles, no wheezes Abdomen: Bowel Sound present, Soft and no tenderness,  Skin: no rashes Extremities: no Pedal edema, no calf tenderness Neurologic: without any new focal findings Gait not checked due to patient safety concerns  Vitals:   04/24/23 2349 04/25/23 0509 04/25/23 0750 04/25/23 1133  BP:  117/75 116/69 107/68  Pulse:  71 84 82  Resp:  20 18 20   Temp: 98.4 F (36.9 C) 98.5 F (36.9 C) 97.9 F (36.6 C) 98.1 F (36.7 C)  TempSrc: Oral Oral    SpO2:  98% 94% 97%  Weight:        Intake/Output Summary (Last 24 hours) at 04/25/2023 1521 Last data filed at 04/25/2023 1128 Gross per 24 hour  Intake 240 ml  Output 1200 ml  Net -960 ml   Filed Weights   04/21/23 1624 04/21/23 2141  Weight: 84 kg 82.9 kg    Data Reviewed: I have personally reviewed and interpreted daily labs, tele strips, imagings as discussed above. I reviewed all nursing notes, pharmacy notes, vitals, pertinent old records I have discussed plan of care as described above with RN and patient/family.  CBC: Recent Labs  Lab 04/19/23 0944 04/19/23 0944 04/19/23 1041 04/21/23 1323 04/21/23 1626 04/21/23 2238 04/22/23 1544 04/23/23 0514 04/24/23 0244 04/24/23 1649 04/25/23 1225  WBC 5.1   < > 5.3 5.7 5.7  --   --  5.2 7.1  --  7.5  NEUTROABS 3.5  --  3.6 3.3  --   --   --   --   --   --   --   HGB 7.4*   < > 7.6* 6.7* 6.9*   < > 7.2*  7.5* 7.1* 8.1* 8.8*  HCT 22.2*  --  23.0* 20.1* 20.6*   < > 21.0* 22.8* 20.7* 23.6* 26.6*  MCV 91.4  --  92.0 92.6 93.6  --   --  92.3 90.8  --  89.6  PLT 337   < > 342 404* 405*  --   --  422* 415*  --  446*   < > = values in this interval not displayed.   Basic Metabolic Panel: Recent Labs  Lab 04/19/23 0944 04/21/23 1626 04/23/23 0514 04/24/23 0244 04/25/23 1225  NA 137 135 138 142 140  K 3.6 3.9 4.0 3.7 3.5  CL 106 103 106 110 107  CO2 25 25 26 26 27   GLUCOSE 98 112* 84 110* 84  BUN 12 11 7* 8 5*  CREATININE 0.52* 0.70 0.55* 0.65 0.65  CALCIUM 8.3* 8.4* 8.5* 8.8* 8.6*  MG  --   --  2.0  --  1.7  PHOS  --   --  3.1  --  3.0  Studies: No results found.  Scheduled Meds:  vitamin C  500 mg Oral Daily   atorvastatin  80 mg Oral Daily   bisacodyl  10 mg Oral Once   vitamin B-12  1,000 mcg Oral Daily   ezetimibe  10 mg Oral Daily   fluticasone furoate-vilanterol  1 puff Inhalation Daily   iron polysaccharides  150 mg Oral Daily   pantoprazole  40 mg Oral BID   phenytoin  400 mg Oral QHS   polyethylene glycol-electrolytes  4,000 mL Oral Once   Continuous Infusions:  lactated ringers 50 mL/hr at 04/25/23 0559   PRN Meds: acetaminophen **OR** acetaminophen, hydrALAZINE, morphine injection, ondansetron **OR** ondansetron (ZOFRAN) IV, polyethylene glycol-electrolytes, traZODone  Time spent: 35 minutes  Author: Gillis Santa. MD [facility] 04/25/2023 3:21 PM  To reach On-call, see care teams to locate the attending and reach out to them via [email or web address]. If 7PM-7AM, please contact night-coverage If you still have difficulty reaching the attending provider, please page the [facility] (Director on Call) for [facility] on amion for assistance.

## 2023-04-25 NOTE — Plan of Care (Signed)

## 2023-04-26 ENCOUNTER — Telehealth: Payer: Self-pay | Admitting: *Deleted

## 2023-04-26 ENCOUNTER — Encounter
Admission: EM | Disposition: A | Discharge status: Home / Self Care | Payer: Self-pay | Source: Home / Self Care | Attending: Student

## 2023-04-26 ENCOUNTER — Inpatient Hospital Stay: Payer: 59 | Admitting: Anesthesiology

## 2023-04-26 ENCOUNTER — Encounter: Payer: Self-pay | Admitting: Internal Medicine

## 2023-04-26 DIAGNOSIS — D509 Iron deficiency anemia, unspecified: Secondary | ICD-10-CM | POA: Diagnosis not present

## 2023-04-26 DIAGNOSIS — D649 Anemia, unspecified: Secondary | ICD-10-CM | POA: Diagnosis not present

## 2023-04-26 HISTORY — PX: COLONOSCOPY WITH PROPOFOL: SHX5780

## 2023-04-26 HISTORY — PX: GIVENS CAPSULE STUDY: SHX5432

## 2023-04-26 HISTORY — PX: POLYPECTOMY: SHX5525

## 2023-04-26 LAB — CBC
HCT: 25 % — ABNORMAL LOW (ref 39.0–52.0)
Hemoglobin: 8.3 g/dL — ABNORMAL LOW (ref 13.0–17.0)
MCH: 29.5 pg (ref 26.0–34.0)
MCHC: 33.2 g/dL (ref 30.0–36.0)
MCV: 89 fL (ref 80.0–100.0)
Platelets: 412 10*3/uL — ABNORMAL HIGH (ref 150–400)
RBC: 2.81 MIL/uL — ABNORMAL LOW (ref 4.22–5.81)
RDW: 18.6 % — ABNORMAL HIGH (ref 11.5–15.5)
WBC: 8.5 10*3/uL (ref 4.0–10.5)
nRBC: 0.2 % (ref 0.0–0.2)

## 2023-04-26 LAB — BASIC METABOLIC PANEL
Anion gap: 8 (ref 5–15)
BUN: 5 mg/dL — ABNORMAL LOW (ref 8–23)
CO2: 24 mmol/L (ref 22–32)
Calcium: 8.4 mg/dL — ABNORMAL LOW (ref 8.9–10.3)
Chloride: 107 mmol/L (ref 98–111)
Creatinine, Ser: 0.69 mg/dL (ref 0.61–1.24)
GFR, Estimated: >60 mL/min (ref >60)
Glucose, Bld: 115 mg/dL — ABNORMAL HIGH (ref 70–99)
Potassium: 3.3 mmol/L — ABNORMAL LOW (ref 3.5–5.1)
Sodium: 139 mmol/L (ref 135–145)

## 2023-04-26 LAB — PHOSPHORUS: Phosphorus: 3.4 mg/dL (ref 2.5–4.6)

## 2023-04-26 LAB — MAGNESIUM: Magnesium: 1.5 mg/dL — ABNORMAL LOW (ref 1.7–2.4)

## 2023-04-26 SURGERY — COLONOSCOPY WITH PROPOFOL
Anesthesia: General

## 2023-04-26 SURGERY — GIVENS CAPSULE STUDY

## 2023-04-26 MED ORDER — PHENYLEPHRINE 80 MCG/ML (10ML) SYRINGE FOR IV PUSH (FOR BLOOD PRESSURE SUPPORT)
PREFILLED_SYRINGE | INTRAVENOUS | Status: DC | PRN
  Administered 2023-04-26 (×2): 80 ug via INTRAVENOUS

## 2023-04-26 MED ORDER — PNEUMOCOCCAL 20-VAL CONJ VACC 0.5 ML IM SUSY
0.5000 mL | PREFILLED_SYRINGE | INTRAMUSCULAR | Status: DC
  Filled 2023-04-26: qty 0.5

## 2023-04-26 MED ORDER — INFLUENZA VAC A&B SURF ANT ADJ 0.5 ML IM SUSY
0.5000 mL | PREFILLED_SYRINGE | INTRAMUSCULAR | Status: DC
  Filled 2023-04-26: qty 0.5

## 2023-04-26 MED ORDER — PROPOFOL 500 MG/50ML IV EMUL
INTRAVENOUS | Status: DC | PRN
  Administered 2023-04-26: 100 ug/kg/min via INTRAVENOUS

## 2023-04-26 MED ORDER — PROPOFOL 10 MG/ML IV BOLUS
INTRAVENOUS | Status: AC
  Filled 2023-04-26: qty 20

## 2023-04-26 MED ORDER — PROPOFOL 10 MG/ML IV BOLUS
INTRAVENOUS | Status: AC
  Filled 2023-04-26: qty 40

## 2023-04-26 MED ORDER — LIDOCAINE HCL (CARDIAC) PF 100 MG/5ML IV SOSY
PREFILLED_SYRINGE | INTRAVENOUS | Status: DC | PRN
  Administered 2023-04-26: 40 mg via INTRAVENOUS

## 2023-04-26 MED ORDER — SODIUM CHLORIDE 0.9 % IV SOLN: INTRAVENOUS | Status: DC

## 2023-04-26 MED ORDER — POTASSIUM CHLORIDE 20 MEQ PO PACK
40.0000 meq | PACK | Freq: Once | ORAL | Status: AC
  Administered 2023-04-26: 40 meq via ORAL
  Filled 2023-04-26: qty 2

## 2023-04-26 MED ORDER — PROPOFOL 10 MG/ML IV BOLUS
INTRAVENOUS | Status: DC | PRN
  Administered 2023-04-26: 80 mg via INTRAVENOUS

## 2023-04-26 MED ORDER — MAGNESIUM SULFATE IN D5W 1-5 GM/100ML-% IV SOLN
1.0000 g | Freq: Once | INTRAVENOUS | Status: AC
  Administered 2023-04-26: 1 g via INTRAVENOUS
  Filled 2023-04-26: qty 100

## 2023-04-26 NOTE — Interval H&P Note (Signed)
/  History and Physical Interval Note: Progress note from 04/25/2023  was reviewed and there was no interval change after seeing and examining the patient.  Written consent was obtained from the patient after discussion of risks, benefits, and alternatives. Patient has consented to proceed with Colonoscopy and Capsule Endoscopy  with possible intervention  Colonoscopy with possible biopsy, control of bleeding, polypectomy, and interventions as necessary has been discussed with the patient/patient representative. Informed consent was obtained from the patient/patient representative after explaining the indication, nature, and risks of the procedure including but not limited to death, bleeding, perforation, missed neoplasm/lesions, cardiorespiratory compromise, and reaction to medications. Opportunity for questions was given and appropriate answers were provided. Patient/patient representative has verbalized understanding is amenable to undergoing the procedure.  Informed consent was obtained from the patient/patient representative after explaining the indication, nature, risks of the procedure, and alternatives of Givens capsule including but not limited to the rare risk of perforation or Given's capsule becoming lodged in the GI tract requiring surgical removal.  Opportunity for questions was given and appropriate answers were provided. Patient/patient representative has verbalized understanding is amenable to undergoing the procedure.    04/26/2023 12:20 PM  Nicoletta Ba  has presented today for surgery, with the diagnosis of symptomatic anemia.  The various methods of treatment have been discussed with the patient and family. After consideration of risks, benefits and other options for treatment, the patient has consented to  Procedure(s): COLONOSCOPY WITH PROPOFOL (N/A) as a surgical intervention.  The patient's history has been reviewed, patient examined, no change in status, stable for surgery.  I  have reviewed the patient's chart and labs.  Questions were answered to the patient's satisfaction.     Viviann Spare Nanetta Batty

## 2023-04-26 NOTE — Plan of Care (Signed)
  Problem: Activity: Goal: Risk for activity intolerance will decrease Outcome: Progressing   Problem: Nutrition: Goal: Adequate nutrition will be maintained Outcome: Progressing   Problem: Coping: Goal: Level of anxiety will decrease Outcome: Progressing   Problem: Pain Managment: Goal: General experience of comfort will improve Outcome: Progressing   Problem: Skin Integrity: Goal: Risk for impaired skin integrity will decrease Outcome: Progressing   Problem: Education: Goal: Knowledge of General Education information will improve Description: Including pain rating scale, medication(s)/side effects and non-pharmacologic comfort measures Outcome: Not Progressing   Problem: Health Behavior/Discharge Planning: Goal: Ability to manage health-related needs will improve Outcome: Not Progressing

## 2023-04-26 NOTE — Telephone Encounter (Signed)
Patient is still in the hospital. Please call his sister-Linda - [phone].

## 2023-04-26 NOTE — Consult Note (Signed)
[facility] Customer service manager [facility]) Accountable Care Organization (ACO) [facility] Liaison Note  04/26/2023  Derek Webb August 26, 1950 [id number]  Location: [facility] RN Hospital Liaison screened the patient remotely at [facility].  Insurance: [employer]   Derek Webb is a 72 y.o. male who is a Primary Care Patient of [name], Salomon Fick, PA-C. The patient was screened for readmission hospitalization with noted medium risk score for unplanned readmission risk with 1 IP in 6 months.  The patient was assessed for potential [facility]) Care Management service needs for post hospital transition for care coordination. Review of patient's electronic medical record reveals patient was admitted with Anemia. Pt followed closely by oncology. No anticipated needs for community care management at this at time.    [facility] Care Management/Population Health does not replace or interfere with any arrangements made by the Inpatient Transition of Care team.   For questions contact:   Elliot Cousin, RN, [facility] Liaison [place]   Population Health Office Hours MTWF  8:00 am-6:00 pm [phone] mobile [phone] [Office toll free line] Office Hours are M-F 8:30 - 5 pm [email or web address]

## 2023-04-26 NOTE — Progress Notes (Signed)
[facility] Progress Note  Patient: Derek Webb    GNF:[id number]  DOA: 04/21/2023     Date of Service: the patient was seen and examined on 04/26/2023  Chief Complaint  Patient presents with   Abnormal Lab   Brief hospital course: Derek Webb is a 72 y.o. male with PMH of CVA, seizures, alcohol use, squamous cell carcinoma of right lung, s/p radiation and started Durvalumab on 08/13/2022, plan for total 12 cycles as per oncology.  PET scan was done as an outpatient which showed no recurrence of cancer.  Hypermetabolic incidental left prostate nodule, patient was seen by urologist Dr. Morrie Sheldon, per note low suspicion for prostate cancer.  Recommended to follow-up in 1 year with urologist, ascending thoracic aortic aneurysm 4.4 cm was diagnosed in August 2023, recommended annual follow-up but patient is not aware, patient has follow-up with GI in 4 weeks, as reviewed from EMR, patient was sent by oncologist for anemia.  Hemoglobin dropped from 11-6.7 so patient was sent to ED for further workup and management.  ED workup VS stable, hemoglobin 6.7, CT angio GI bleed: . No CT evidence of a gastrointestinal hemorrhage. Aortic Atherosclerosis NON-VASCULAR: 1. Interval development of a partially visualized indeterminate triangular shaped left upper lobe 3 mm pulmonary nodule morphology is suggestive of an intrapulmonary lymph node. 2. Chronic 2.1 cm left hepatic lobe lesion suggestive of a hepatic hemangioma. 3. Colonic diverticulosis with no acute diverticulitis.  1 unit of PRBC transfusion was given in the ED, GI was consulted.  [facility] consulted for admission and further management as below.   Assessment and Plan:  Symptomatic anemia, unknown cause, no overt GI bleeding Hb 6.7-7.8---7.2--7.5 -7.1--8.3 stable 1 unit PRBC transfused, 2nd u PRBC transfusion given on 9/1 S/p PPI IV, transition to pantoprazole 40 mg p.o. twice daily Monitor H&H and transfuse if hemoglobin less than 7 Hold  Plavix GI consulted s/p EGD found to have mild gastritis, no bleeding.  GI recommended to resume diet, possible colonoscopy on Tuesday after 5 days of Plavix  9/2 clear liquid diet, started GoLytely prep for colonoscopy on 9/3 9/3 s/p colonoscopy: Four subcentimeter polyps removed. Hemorrhoids noted Diverticulosis throughout the colon.  One of the ascending diverticula appeared to have a clot in it which may be the source of the patient's anemia and dark stool Will perform video capsule endoscopy to evaluate for other sources as well within the small bowel. Keep N.p.o. Clear liquids after 4 hours.  Regular diet after 7 hours Hold Plavix for 2 days Follow GI for further recommendation and discharge  Hypokalemia, potassium repleted. Hypomagnesemia, mag repleted. Monitor electrolytes and replete as needed.  Iron deficiency, transferrin saturation 12% Start oral iron supplement with vitamin C B12 level 373, goal >400, started oral supplement.   Hypertension S/p  enalapril 20 mg p.o. daily home dose, d/c;d on 8/31 due to low BP  Monitor BP and titrate medications accordingly Use hydralazine as needed   H/o CVA and seizures Continue seizure precaution Continue Dilantin Continue Lipitor and Zetia Hold Plavix for 2 days s/p polyp removal on 9/3   Ascending thoracic aortic aneurysm Patient denies any chest pain, it seems stable.  Patient was not aware of aneurysm.  It was diagnosed in August 2023 Patient already received IV contrast for CT abdomen so to avoid excessive contrast I would recommend to follow-up with an outpatient to repeat CTA chest as an outpatient.  GERD on PPI  Squamous cell carcinoma of the lung Continue to follow oncologist  as per recommendation and continue chemotherapy  Suspected prostate cancer, recommended to follow with the urologist as an outpatient   Body mass index is 28.62 kg/m.  Interventions:   Consults:  GI Procedures: s/p EGD done on 8/31 S/p   colonoscopy done on 9/3, multiple polyps could be the source of bleeding.   Capsule endoscopy started today 9/3, follow GI for report  Diet: Soft diet DVT Prophylaxis: SCD, pharmacological prophylaxis contraindicated due to Anemia     Advance goals of care discussion: Full code  Family Communication: family was  notpresent at bedside, at the time of interview.  The pt provided permission to discuss medical plan with the family. Opportunity was given to ask question and all questions were answered satisfactorily.   Disposition:  Pt is from Home, admitted with Low Hb s/p PRBC, GI consulted, s/p EGD, and colonoscopy, started capsule endoscopy today, which precludes a safe discharge. Discharge to Home, when stable, most likely tomorrow if H&H stable   Subjective: No significant events overnight, patient was seen in the morning time, he was sitting comfortably at the edge of the bed.  Patient was ready for the colonoscopy, denied any complaints.  Physical Exam: General: NAD, lying comfortably Appear in no distress, affect appropriate Eyes: PERRLA ENT: Oral Mucosa Clear, moist  Neck: no JVD,  Cardiovascular: S1 and S2 Present, no Murmur,  Respiratory: good respiratory effort, Bilateral Air entry equal and Decreased, no Crackles, no wheezes Abdomen: Bowel Sound present, Soft and no tenderness,  Skin: no rashes Extremities: no Pedal edema, no calf tenderness Neurologic: without any new focal findings Gait not checked due to patient safety concerns  Vitals:   04/26/23 1222 04/26/23 1318 04/26/23 1328 04/26/23 1336  BP: (!) 121/107 (!) 97/57 107/61 119/62  Pulse: 73 70 71 68  Resp: 18 (!) 22 (!) 21 (!) 21  Temp: (!) 97.5 F (36.4 C) (!) 97 F (36.1 C)    TempSrc: Temporal Temporal    SpO2: 96% 92% 97% 97%  Weight:        Intake/Output Summary (Last 24 hours) at 04/26/2023 1417 Last data filed at 04/26/2023 1314 Gross per 24 hour  Intake 1755.04 ml  Output 200 ml  Net 1555.04 ml    Filed Weights   04/21/23 1624 04/21/23 2141  Weight: 84 kg 82.9 kg    Data Reviewed: I have personally reviewed and interpreted daily labs, tele strips, imagings as discussed above. I reviewed all nursing notes, pharmacy notes, vitals, pertinent old records I have discussed plan of care as described above with RN and patient/family.  CBC: Recent Labs  Lab 04/21/23 1323 04/21/23 1626 04/21/23 2238 04/23/23 0514 04/24/23 0244 04/24/23 1649 04/25/23 1225 04/26/23 0334  WBC 5.7 5.7  --  5.2 7.1  --  7.5 8.5  NEUTROABS 3.3  --   --   --   --   --   --   --   HGB 6.7* 6.9*   < > 7.5* 7.1* 8.1* 8.8* 8.3*  HCT 20.1* 20.6*   < > 22.8* 20.7* 23.6* 26.6* 25.0*  MCV 92.6 93.6  --  92.3 90.8  --  89.6 89.0  PLT 404* 405*  --  422* 415*  --  446* 412*   < > = values in this interval not displayed.   Basic Metabolic Panel: Recent Labs  Lab 04/21/23 1626 04/23/23 0514 04/24/23 0244 04/25/23 1225 04/26/23 0334  NA 135 138 142 140 139  K 3.9 4.0 3.7 3.5 3.3*  CL 103 106 110 107 107  CO2 25 26 26 27 24   GLUCOSE 112* 84 110* 84 115*  BUN 11 7* 8 5* 5*  CREATININE 0.70 0.55* 0.65 0.65 0.69  CALCIUM 8.4* 8.5* 8.8* 8.6* 8.4*  MG  --  2.0  --  1.7 1.5*  PHOS  --  3.1  --  3.0 3.4    Studies: No results found.  Scheduled Meds:  [MAR Hold] vitamin C  500 mg Oral Daily   [MAR Hold] atorvastatin  80 mg Oral Daily   [MAR Hold] vitamin B-12  1,000 mcg Oral Daily   [MAR Hold] ezetimibe  10 mg Oral Daily   [MAR Hold] fluticasone furoate-vilanterol  1 puff Inhalation Daily   [MAR Hold] iron polysaccharides  150 mg Oral Daily   [MAR Hold] pantoprazole  40 mg Oral BID   [MAR Hold] phenytoin  400 mg Oral QHS   Continuous Infusions:  sodium chloride 20 mL/hr at 04/26/23 1240   lactated ringers 50 mL/hr at 04/26/23 1221   PRN Meds: [MAR Hold] acetaminophen **OR** [MAR Hold] acetaminophen, [MAR Hold] hydrALAZINE, [MAR Hold]  morphine injection, [MAR Hold] ondansetron **OR** [MAR Hold]  ondansetron (ZOFRAN) IV, [MAR Hold] polyethylene glycol-electrolytes, [MAR Hold] traZODone  Time spent: 35 minutes  Author: Gillis Santa. MD [facility] 04/26/2023 2:17 PM  To reach On-call, see care teams to locate the attending and reach out to them via [email or web address]. If 7PM-7AM, please contact night-coverage If you still have difficulty reaching the attending provider, please page the [facility] (Director on Call) for [facility] on amion for assistance.

## 2023-04-26 NOTE — Transfer of Care (Signed)
Immediate Anesthesia Transfer of Care Note  Patient: Derek Webb  Procedure(s) Performed: COLONOSCOPY WITH PROPOFOL POLYPECTOMY GIVENS CAPSULE STUDY  Patient Location: PACU  Anesthesia Type:General  Level of Consciousness: drowsy  Airway & Oxygen Therapy: Patient Spontanous Breathing  Post-op Assessment: Report given to RN and Post -op Vital signs reviewed and stable  Post vital signs: Reviewed and stable  Last Vitals:  Vitals Value Taken Time  BP 97/57 04/26/23 1318  Temp 36.1 C 04/26/23 1318  Pulse 70 04/26/23 1318  Resp 22 04/26/23 1318  SpO2 92 % 04/26/23 1318    Last Pain:  Vitals:   04/26/23 1318  TempSrc: Temporal  PainSc: Asleep      Patients Stated Pain Goal: 0 (04/25/23 0800)  Complications: No notable events documented.

## 2023-04-26 NOTE — Telephone Encounter (Signed)
Pt's sister, [name], left message stating that pt is currently admitted due to blood loss/anemia. Pt scheduled for colonoscopy this afternoon. Pt's sister is asking if his appts this week need to be rescheduled due to hospitalization. Pt's sister can be reached at [phone] to inform of rescheduled appts.  Please advise.

## 2023-04-26 NOTE — Anesthesia Preprocedure Evaluation (Addendum)
Anesthesia Evaluation  Patient identified by MRN, date of birth, ID band Patient confused    Reviewed: Allergy & Precautions, H&P , NPO status , Patient's Chart, lab work & pertinent test results, reviewed documented beta blocker date and time   History of Anesthesia Complications Negative for: history of anesthetic complications  Airway Mallampati: II  TM Distance: >3 FB Neck ROM: full    Dental  (+) Missing, Dental Advidsory Given, Poor Dentition   Pulmonary neg pulmonary ROS, shortness of breath, Continuous Positive Airway Pressure Ventilation , COPD,  COPD inhaler and oxygen dependent, neg recent URI, former smoker   Pulmonary exam normal breath sounds clear to auscultation       Cardiovascular Exercise Tolerance: Good hypertension, (-) angina (-) Past MI Normal cardiovascular exam Rhythm:regular Rate:Normal     Neuro/Psych Seizures -,  CVA, Residual Symptoms  negative psych ROS   GI/Hepatic Neg liver ROS,GERD  Controlled,,  Endo/Other  negative endocrine ROS    Renal/GU negative Renal ROS  negative genitourinary   Musculoskeletal   Abdominal   Peds  Hematology  (+) Blood dyscrasia, anemia   Anesthesia Other Findings Past Medical History: No date: Alcoholism (HCC) No date: COPD (chronic obstructive pulmonary disease) (HCC)     Comment:  SEVERE COPD -OCCASIONALLY USES OXYGEN AT NIGHT-DOES NOT               USE INHALERS ON REGULAR BASIS No date: Elevated cholesterol No date: GERD (gastroesophageal reflux disease) No date: HOH (hard of hearing) No date: Hypertension No date: Lung cancer (HCC)     Comment:  LEFT UPPER LUNG CARCINOMA-NOT A CANDIDATE FOR SURGICAL               RESECTION BECAUSE OF HIS COPD AND POOR CANDIDATE FOR               RADIATION BECAUSE OF TRANSPORTATION PROBLEMS No date: Seizures (HCC)     Comment:  CHRONIC DILATIN - PT STATES NO SEIZURES IN PAST COUPLE               OF YEARS; PT STATES  HIS SEIZURES CAUSED NUMBNESS OF ARMS               AND HANDS AND NOT ABLE TO MOVE HIS ARMS No date: Stroke (HCC)     Comment:  2 TO 3 YRS AGO-AFFECTED HIS SPEECH--ABLE TO AMBULATE               WITHOUT ASSIST AND DOES YARD WORK.  DECREASED HEARING IN               BOTH EARS SINCE STROKE. No date: Vitamin D deficiency   Reproductive/Obstetrics negative OB ROS                              Anesthesia Physical Anesthesia Plan  ASA: 3  Anesthesia Plan: General   Post-op Pain Management:    Induction: Intravenous  PONV Risk Score and Plan: Propofol infusion and TIVA  Airway Management Planned: Natural Airway and Nasal Cannula  Additional Equipment:   Intra-op Plan:   Post-operative Plan:   Informed Consent: I have reviewed the patients History and Physical, chart, labs and discussed the procedure including the risks, benefits and alternatives for the proposed anesthesia with the patient or authorized representative who has indicated his/her understanding and acceptance.     Consent reviewed with POA  Plan Discussed with: Anesthesiologist, CRNA and Surgeon  Anesthesia Plan Comments: (Due to documented difficult historian/poor understanding of medical situation, I discussed risks of anesthesia with patient's sister Sylvester Romberger by phone)         Anesthesia Quick Evaluation

## 2023-04-26 NOTE — Plan of Care (Signed)

## 2023-04-26 NOTE — Care Plan (Signed)
Brief GI Post Op Note  Colonoscopy performed today Four subcentimeter polyps removed Hemorrhoids noted Diverticulosis throughout the colon.  One of the ascending diverticula appeared to have a clot in it which may be the source of the patient's anemia and dark stool Will perform video capsule endoscopy to evaluate for other sources as well within the small bowel. N.p.o. Clear liquids after 4 hours.  Regular diet after 7 hours  Hold Plavix for 2 days Continue to monitor H&H with transfusion and resuscitation as per primary team  Attempted to call patient's sister for update. Left voice message with callback number  Enis Slipper, DO [facility]

## 2023-04-26 NOTE — Op Note (Signed)
[facility]astroenter[facility] Patient Name: Derek Webb Procedure Date: 04/26/2023 12:40 PM MRN: [id number] Account #: [id number] Date of Birth: Mar 17, 1951 Admit Type: Inpatient Age: 72 Room: [facility] ENDO ROOM 2 Gender: Male Note Status: Finalized Instrument Name: Colonoscope [id number] Procedure:             Colonoscopy Indications:           Acute post hemorrhagic anemia Providers:             Jaynie Collins DO, DO Referring MD:          No Local Md, MD (Referring MD) Medicines:             Monitored Anesthesia Care Complications:         No immediate complications. Estimated blood loss:                         Minimal. Procedure:             Pre-Anesthesia Assessment:                        - Prior to the procedure, a History and Physical was                         performed, and patient medications and allergies were                         reviewed. The patient is competent. The risks and                         benefits of the procedure and the sedation options and                         risks were discussed with the patient. All questions                         were answered and informed consent was obtained.                         Patient identification and proposed procedure were                         verified by the physician, the nurse, the anesthetist                         and the technician in the endoscopy suite. Mental                         Status Examination: alert and oriented. Airway                         Examination: normal oropharyngeal airway and neck                         mobility. Respiratory Examination: clear to                         auscultation. CV Examination: RRR, no murmurs, no S3  or S4. Prophylactic Antibiotics: The patient does not                         require prophylactic antibiotics. Prior                         Anticoagulants: The patient has taken Plavix                          (clopidogrel), last dose was 5 days prior to                         procedure. ASA Grade Assessment: III - A patient with                         severe systemic disease. After reviewing the risks and                         benefits, the patient was deemed in satisfactory                         condition to undergo the procedure. The anesthesia                         plan was to use monitored anesthesia care (MAC).                         Immediately prior to administration of medications,                         the patient was re-assessed for adequacy to receive                         sedatives. The heart rate, respiratory rate, oxygen                         saturations, blood pressure, adequacy of pulmonary                         ventilation, and response to care were monitored                         throughout the procedure. The physical status of the                         patient was re-assessed after the procedure.                        After obtaining informed consent, the colonoscope was                         passed under direct vision. Throughout the procedure,                         the patient's blood pressure, pulse, and oxygen                         saturations were monitored continuously. The  Colonoscope was introduced through the anus and                         advanced to the the terminal ileum, with                         identification of the appendiceal orifice and IC                         valve. The colonoscopy was performed without                         difficulty. The patient tolerated the procedure well.                         The quality of the bowel preparation was evaluated                         using the BBPS [facility] Bowel Preparation Scale) with                         scores of: Right Colon = 2 (minor amount of residual                         staining, small fragments of stool and/or opaque                          liquid, but mucosa seen well), Transverse Colon = 3                         (entire mucosa seen well with no residual staining,                         small fragments of stool or opaque liquid) and Left                         Colon = 3 (entire mucosa seen well with no residual                         staining, small fragments of stool or opaque liquid).                         The total BBPS score equals 8. The quality of the                         bowel preparation was excellent. The terminal ileum,                         ileocecal valve, appendiceal orifice, and rectum were                         photographed. Findings:      The perianal and digital rectal examinations were normal. Pertinent       negatives include normal sphincter tone.      The terminal ileum appeared normal. brown stool within- no signs of       fresh or old blood. Estimated blood loss:  none.      Retroflexion in the right colon was performed.      Three sessile polyps were found in the descending colon, ascending colon       and cecum. The polyps were 3 to 5 mm in size. These polyps were removed       with a cold snare. Resection and retrieval were complete. Estimated       blood loss was minimal.      A 1 to 2 mm polyp was found in the sigmoid colon. The polyp was sessile.       The polyp was removed with a jumbo cold forceps. Resection and retrieval       were complete. Estimated blood loss was minimal.      Multiple small-mouthed diverticula were found in the entire colon.       Small, dark clot appearing in one of the ascending diverticula which may       have been the source of blood loss/dark stool. No active bleeding or       other fresh/old blood noted. Other stool within the colon was light       brown/yellow. Estimated blood loss: none.      Non-bleeding internal hemorrhoids were found during retroflexion. The       hemorrhoids were Grade I (internal hemorrhoids that do not prolapse).       Estimated  blood loss: none.      The exam was otherwise without abnormality on direct and retroflexion       views. Impression:            - The examined portion of the ileum was normal.                        - Three 3 to 5 mm polyps in the descending colon, in                         the ascending colon and in the cecum, removed with a                         cold snare. Resected and retrieved.                        - One 1 to 2 mm polyp in the sigmoid colon, removed                         with a jumbo cold forceps. Resected and retrieved.                        - Diverticulosis in the entire examined colon.                        - Non-bleeding internal hemorrhoids.                        - The examination was otherwise normal on direct and                         retroflexion views. Recommendation:        - Patient has a contact number available for  emergencies. The signs and symptoms of potential                         delayed complications were discussed with the patient.                         Return to normal activities tomorrow. Written                         discharge instructions were provided to the patient.                        - Return patient to hospital ward for ongoing care.                        - NPO for 4 horus after capsule endoscopy. Then                         clears, then regular diet at 7 hours.                        - Continue present medications.                        - Resume Plavix (clopidogrel) at prior dose in 2 days.                         Refer to managing physician for further adjustment of                         therapy.                        - No ibuprofen, naproxen, or other non-steroidal                         anti-inflammatory drugs for 5 days after polyp removal.                        - Repeat colonoscopy for surveillance based on                         pathology results.                        - Return to referring  physician as previously                         scheduled.                        - The findings and recommendations were discussed with                         the patient's family.                        - The findings and recommendations were discussed with                         the patient.                        -  The findings and recommendations were discussed with                         the referring physician. Procedure Code(s):     --- Professional ---                        [phone], Colonoscopy, flexible; with removal of                         tumor(s), polyp(s), or other lesion(s) by snare                         technique                        45380, 59, Colonoscopy, flexible; with biopsy, single                         or multiple Diagnosis Code(s):     --- Professional ---                        K64.0, First degree hemorrhoids                        D12.4, Benign neoplasm of descending colon                        D12.2, Benign neoplasm of ascending colon                        D12.0, Benign neoplasm of cecum                        D12.5, Benign neoplasm of sigmoid colon                        D62, Acute posthemorrhagic anemia                        K57.30, Diverticulosis of large intestine without                         perforation or abscess without bleeding CPT copyright 2022 American Medical Association. All rights reserved. The codes documented in this report are preliminary and upon coder review may  be revised to meet current compliance requirements. Attending Participation:      I personally performed the entire procedure. Elfredia Nevins, DO Jaynie Collins DO, DO 04/26/2023 1:19:26 PM This report has been signed electronically. Number of Addenda: 0 Note Initiated On: 04/26/2023 12:40 PM Scope Withdrawal Time: 0 hours 17 minutes 20 seconds  Total Procedure Duration: 0 hours 22 minutes 14 seconds  Estimated Blood Loss:  Estimated blood loss was minimal.       [facility]

## 2023-04-27 ENCOUNTER — Inpatient Hospital Stay: Payer: 59

## 2023-04-27 ENCOUNTER — Encounter: Payer: Self-pay | Admitting: Gastroenterology

## 2023-04-27 DIAGNOSIS — D62 Acute posthemorrhagic anemia: Secondary | ICD-10-CM | POA: Diagnosis not present

## 2023-04-27 DIAGNOSIS — I6310 Cerebral infarction due to embolism of unspecified precerebral artery: Secondary | ICD-10-CM | POA: Diagnosis not present

## 2023-04-27 DIAGNOSIS — C3490 Malignant neoplasm of unspecified part of unspecified bronchus or lung: Secondary | ICD-10-CM | POA: Diagnosis not present

## 2023-04-27 DIAGNOSIS — I10 Essential (primary) hypertension: Secondary | ICD-10-CM

## 2023-04-27 DIAGNOSIS — I77819 Aortic ectasia, unspecified site: Secondary | ICD-10-CM | POA: Diagnosis not present

## 2023-04-27 DIAGNOSIS — R569 Unspecified convulsions: Secondary | ICD-10-CM

## 2023-04-27 LAB — CBC
HCT: 24.2 % — ABNORMAL LOW (ref 39.0–52.0)
Hemoglobin: 8 g/dL — ABNORMAL LOW (ref 13.0–17.0)
MCH: 29.7 pg (ref 26.0–34.0)
MCHC: 33.1 g/dL (ref 30.0–36.0)
MCV: 90 fL (ref 80.0–100.0)
Platelets: 386 10*3/uL (ref 150–400)
RBC: 2.69 MIL/uL — ABNORMAL LOW (ref 4.22–5.81)
RDW: 17.8 % — ABNORMAL HIGH (ref 11.5–15.5)
WBC: 6 10*3/uL (ref 4.0–10.5)
nRBC: 0 % (ref 0.0–0.2)

## 2023-04-27 LAB — BASIC METABOLIC PANEL
Anion gap: 6 (ref 5–15)
BUN: <5 mg/dL — ABNORMAL LOW (ref 8–23)
CO2: 26 mmol/L (ref 22–32)
Calcium: 8.2 mg/dL — ABNORMAL LOW (ref 8.9–10.3)
Chloride: 107 mmol/L (ref 98–111)
Creatinine, Ser: 0.55 mg/dL — ABNORMAL LOW (ref 0.61–1.24)
GFR, Estimated: >60 mL/min (ref >60)
Glucose, Bld: 89 mg/dL (ref 70–99)
Potassium: 3.7 mmol/L (ref 3.5–5.1)
Sodium: 139 mmol/L (ref 135–145)

## 2023-04-27 LAB — PHOSPHORUS: Phosphorus: 3.9 mg/dL (ref 2.5–4.6)

## 2023-04-27 LAB — MAGNESIUM: Magnesium: 1.8 mg/dL (ref 1.7–2.4)

## 2023-04-27 MED ORDER — SODIUM CHLORIDE 0.9% FLUSH
10.0000 mL | INTRAVENOUS | Status: DC | PRN
  Administered 2023-04-28 – 2023-04-29 (×2): 10 mL

## 2023-04-27 MED ORDER — CHLORHEXIDINE GLUCONATE CLOTH 2 % EX PADS
6.0000 | MEDICATED_PAD | Freq: Every day | CUTANEOUS | Status: DC
  Administered 2023-04-27 – 2023-04-28 (×2): 6 via TOPICAL

## 2023-04-27 MED ORDER — CLOPIDOGREL BISULFATE 75 MG PO TABS
75.0000 mg | ORAL_TABLET | Freq: Every day | ORAL | Status: DC
  Administered 2023-04-28 – 2023-04-29 (×2): 75 mg via ORAL
  Filled 2023-04-27 (×2): qty 1

## 2023-04-27 NOTE — Progress Notes (Signed)
Progress Note   Patient: Derek Webb ZOX:[id number] DOB: [birth year] DOA: 04/21/2023     6 DOS: the patient was seen and examined on 04/27/2023   Brief hospital course: Derek Webb is a 72 y.o. male with PMH of CVA, seizures, alcohol use, squamous cell carcinoma of right lung, s/p radiation and started Durvalumab on 08/13/2022, plan for total 12 cycles as per oncology.  PET scan was done as an outpatient which showed no recurrence of cancer.  Hypermetabolic incidental left prostate nodule, patient was seen by urologist Dr. Morrie Sheldon, per note low suspicion for prostate cancer.  Recommended to follow-up in 1 year with urologist, ascending thoracic aortic aneurysm 4.4 cm was diagnosed in August 2023, recommended annual follow-up but patient is not aware, patient has follow-up with GI in 4 weeks, as reviewed from EMR, patient was sent by oncologist for anemia.  Hemoglobin dropped from 11-6.7 so patient was sent to ED for further workup and management.   Assessment and Plan: Symptomatic anemia, unknown cause, no overt GI bleeding Hb 6.7-7.8---7.2--7.5 -7.1--8.3--8.0 stable S/p 1 unit PRBC transfused, 2nd u PRBC transfusion given on 9/1 IV PPI transitioned to pantoprazole 40 mg p.o. twice daily Monitor H&H and transfuse if hemoglobin less than 7 Discussed with GI s/p EGD found to have mild gastritis, no bleeding.   9/3 s/p colonoscopy: Four subcentimeter polyps removed. Hemorrhoids noted. Diverticulosis throughout the colon.  One of the ascending diverticula appeared to have a clot in it which may be the source of the patient's anemia and dark stool S/p video capsule endoscopy - discussed with GI capsule did not reach the colon, but overall did not reveal anything abnormal outside colonoscopy or endoscopy. He will need KUB in 1 week to assess for capsule passage. GI office will contact him for follow up appointment. GI advised to restart Plavix. He is able to tolerate diet.    Hypokalemia,  potassium repleted. Monitor daily electrolytes Hypomagnesemia, mag repleted. Monitor electrolytes and replete as needed.   Iron deficiency, transferrin saturation 12% Continue oral iron supplement with vitamin C B12 level 373, continue oral supplement.   Hypertension ACEI on hold due to low BP. Monitor BP and titrate medications accordingly Use hydralazine as needed   H/o CVA and seizures Continue seizure precaution Continue Dilantin. Continue Lipitor and Zetia Restarted Plavix.   Ascending thoracic aortic aneurysm Patient denies any chest pain, it seems stable.  Patient was not aware of aneurysm.  It was diagnosed in August 2023 Patient already received IV contrast for CT abdomen so to avoid excessive contrast I would recommend to follow-up with an outpatient to repeat CTA chest as an outpatient.   GERD on PPI   Squamous cell carcinoma of the lung Continue to follow oncologist as per recommendation and continue chemotherapy   Suspected prostate cancer, recommended to follow with the urologist as an outpatient  Consults:  GI Procedures: s/p EGD done on 8/31 S/p  colonoscopy done on 9/3, multiple polyps could be the source of bleeding.   Capsule endoscopy need GI follow up outpatient  for KUB and results.   Diet: Soft diet DVT Prophylaxis: SCD, pharmacological prophylaxis contraindicated due to Anemia         Subjective: Patient is seen and examined today morning. He is able to tolerate diet, denies any active bleeding, nausea.  Physical Exam: Vitals:   04/27/23 0015 04/27/23 0451 04/27/23 0737 04/27/23 1143  BP: 117/75 118/74 105/69 107/70  Pulse: 76 72 71 74  Resp: 17 17  16 16  Temp: 99.1 F (37.3 C) 98.9 F (37.2 C) 98.3 F (36.8 C) 98.1 F (36.7 C)  TempSrc: Oral Oral    SpO2: 100% 97% 99% 100%  Weight:      Height:       General -Elderly African American male, sitting in no apparent distress HEENT - PERRLA, EOMI, atraumatic head, non tender  sinuses. Lung -clear, no rales, rhonchi Heart - S1, S2 heard, no murmurs, rubs, trace pedal edema Neuro - Alert, awake and oriented x 3, non focal exam. Skin - Warm and dry. Data Reviewed:     Latest Ref Rng & Units 04/27/2023    4:11 AM 04/26/2023    3:34 AM 04/25/2023   12:25 PM  CBC  WBC 4.0 - 10.5 K/uL 6.0  8.5  7.5   Hemoglobin 13.0 - 17.0 g/dL 8.0  8.3  8.8   Hematocrit 39.0 - 52.0 % 24.2  25.0  26.6   Platelets 150 - 400 K/uL 386  412  446        Latest Ref Rng & Units 04/27/2023    4:11 AM 04/26/2023    3:34 AM 04/25/2023   12:25 PM  BMP  Glucose 70 - 99 mg/dL 89  956  84   BUN 8 - 23 mg/dL 5  5  5    Creatinine 0.61 - 1.24 mg/dL 2.13  0.86  5.78   Sodium 135 - 145 mmol/L 139  139  140   Potassium 3.5 - 5.1 mmol/L 3.7  3.3  3.5   Chloride 98 - 111 mmol/L 107  107  107   CO2 22 - 32 mmol/L 26  24  27    Calcium 8.9 - 10.3 mg/dL 8.2  8.4  8.6    No results found.   Family Communication: Patient understands current care plan.  Disposition: Status is: Inpatient Remains inpatient appropriate because: GI work up  Planned Discharge Destination: Home with Home Health    Time spent: 44 minutes  Author: Marcelino Duster, MD 04/27/2023 8:17 PM  For on call review [email or web address].

## 2023-04-27 NOTE — Progress Notes (Signed)
Inpatient Follow-up/Progress Note   Patient ID: Derek Webb is a 72 y.o. male.  Overnight Events / Subjective Findings Capsule reviewed. Spent significant amount of time in esophagus and stomach. Study is limited. Did not reach colon. No signs of blood/bleeding aside from erosions that were already appreciated on EGD. Tolerating PO without issue. Hgb stable. No other acute gi complaints.  Review of Systems  Constitutional:  Negative for appetite change.  Gastrointestinal:  Negative for abdominal distention, abdominal pain, anal bleeding, blood in stool, constipation, diarrhea, nausea, rectal pain and vomiting.     Medications  Current Facility-Administered Medications:    acetaminophen (TYLENOL) tablet 650 mg, 650 mg, Oral, Q6H PRN, 650 mg at 04/24/23 2034 **OR** acetaminophen (TYLENOL) suppository 650 mg, 650 mg, Rectal, Q6H PRN, Gertha Calkin, MD   ascorbic acid (VITAMIN C) tablet 500 mg, 500 mg, Oral, Daily, Gillis Santa, MD, 500 mg at 04/27/23 0849   atorvastatin (LIPITOR) tablet 80 mg, 80 mg, Oral, Daily, Gillis Santa, MD, 80 mg at 04/27/23 6295   Chlorhexidine Gluconate Cloth 2 % PADS 6 each, 6 each, Topical, Daily, Marcelino Duster, MD, 6 each at 04/27/23 0849   cyanocobalamin (VITAMIN B12) tablet 1,000 mcg, 1,000 mcg, Oral, Daily, Gillis Santa, MD, 1,000 mcg at 04/27/23 0849   ezetimibe (ZETIA) tablet 10 mg, 10 mg, Oral, Daily, Gillis Santa, MD, 10 mg at 04/27/23 0849   fluticasone furoate-vilanterol (BREO ELLIPTA) 200-25 MCG/ACT 1 puff, 1 puff, Inhalation, Daily, Gillis Santa, MD, 1 puff at 04/27/23 0849   hydrALAZINE (APRESOLINE) injection 5 mg, 5 mg, Intravenous, Q4H PRN, Gertha Calkin, MD   influenza vaccine adjuvanted (FLUAD) injection 0.5 mL, 0.5 mL, Intramuscular, Tomorrow-1000, Gillis Santa, MD   iron polysaccharides (NIFEREX) capsule 150 mg, 150 mg, Oral, Daily, Gillis Santa, MD, 150 mg at 04/27/23 0849   lactated ringers infusion, , Intravenous,  Continuous, Gertha Calkin, MD, Last Rate: 50 mL/hr at 04/27/23 0230, New Bag at 04/27/23 0230   morphine (PF) 2 MG/ML injection 2 mg, 2 mg, Intravenous, Q2H PRN, Allena Katz, Eliezer Mccoy, MD   ondansetron (ZOFRAN) tablet 4 mg, 4 mg, Oral, Q6H PRN **OR** ondansetron (ZOFRAN) injection 4 mg, 4 mg, Intravenous, Q6H PRN, Allena Katz, Eliezer Mccoy, MD   pantoprazole (PROTONIX) EC tablet 40 mg, 40 mg, Oral, BID, Gillis Santa, MD, 40 mg at 04/27/23 0849   phenytoin (DILANTIN) ER capsule 400 mg, 400 mg, Oral, QHS, Gillis Santa, MD, 400 mg at 04/26/23 2029   pneumococcal 20-valent conjugate vaccine (PREVNAR 20) injection 0.5 mL, 0.5 mL, Intramuscular, Tomorrow-1000, Lucianne Muss, Dileep, MD   polyethylene glycol-electrolytes (NuLYTELY) solution 2,000 mL, 2,000 mL, Oral, Once PRN, Jaynie Collins, DO   sodium chloride flush (NS) 0.9 % injection 10-40 mL, 10-40 mL, Intracatheter, PRN, Marcelino Duster, MD   traZODone (DESYREL) tablet 50 mg, 50 mg, Oral, QHS PRN, Andris Baumann, MD, 50 mg at 04/26/23 2029  lactated ringers 50 mL/hr at 04/27/23 0230    acetaminophen **OR** acetaminophen, hydrALAZINE, morphine injection, ondansetron **OR** ondansetron (ZOFRAN) IV, polyethylene glycol-electrolytes, sodium chloride flush, traZODone   Objective    Vitals:   04/27/23 0015 04/27/23 0451 04/27/23 0737 04/27/23 1143  BP: 117/75 118/74 105/69 107/70  Pulse: 76 72 71 74  Resp: 17 17 16 16   Temp: 99.1 F (37.3 C) 98.9 F (37.2 C) 98.3 F (36.8 C) 98.1 F (36.7 C)  TempSrc: Oral Oral    SpO2: 100% 97% 99% 100%  Weight:      Height:  Physical Exam Constitutional:      General: He is not in acute distress.    Appearance: He is not ill-appearing, toxic-appearing or diaphoretic.  HENT:     Head: Normocephalic and atraumatic.     Nose: Nose normal.     Mouth/Throat:     Mouth: Mucous membranes are moist.     Pharynx: Oropharynx is clear.  Pulmonary:     Effort: Pulmonary effort is normal.     Breath sounds:  Normal breath sounds.  Skin:    General: Skin is warm and dry.  Neurological:     Mental Status: He is alert. Mental status is at baseline.      Laboratory Data Recent Labs  Lab 04/21/23 1323 04/21/23 1626 04/25/23 1225 04/26/23 0334 04/27/23 0411  WBC 5.7   < > 7.5 8.5 6.0  HGB 6.7*   < > 8.8* 8.3* 8.0*  HCT 20.1*   < > 26.6* 25.0* 24.2*  PLT 404*   < > 446* 412* 386  NEUTOPHILPCT 57  --   --   --   --   LYMPHOPCT 24  --   --   --   --   MONOPCT 13  --   --   --   --   EOSPCT 4  --   --   --   --    < > = values in this interval not displayed.   Recent Labs  Lab 04/21/23 2238 04/23/23 0514 04/25/23 1225 04/26/23 0334 04/27/23 0411  NA  --    < > 140 139 139  K  --    < > 3.5 3.3* 3.7  CL  --    < > 107 107 107  CO2  --    < > 27 24 26   BUN  --    < > 5* 5* <5*  CREATININE  --    < > 0.65 0.69 0.55*  CALCIUM  --    < > 8.6* 8.4* 8.2*  PROT 6.5  --   --   --   --   BILITOT 0.6  --   --   --   --   ALKPHOS 93  --   --   --   --   ALT 12  --   --   --   --   AST 13*  --   --   --   --   GLUCOSE  --    < > 84 115* 89   < > = values in this interval not displayed.   Recent Labs  Lab 04/21/23 1852 04/24/23 0244  INR 1.2 1.1      Imaging Studies: No results found.  Assessment:   # Symptomatic anemia   # gastirtis and esophagitis - egd 04/23/23   #  colon polyps and h/o advanced polyps - csy  04/26/23   # sq cell lung ca on systemic chemotherapy   # CVA- on plavix (currently being held)  Plan:  Colonoscopy showed some potential old clot within one ascending colon diverticulum, no bleeding at that time and no other blood in the colon or stool. Stool was light brown/green in nature Revealed adenmatous polyps Some erosions in stomach may have attributed to lower hgb as well Awaiting h pylori staining Capsule not revealing of other source Since capsule did not reach colon, recommend KUB in 1 week to assess if this has passed Continue bid ppi Continue  iron infusions Resume  plavix No further gi intervention planned  Follow up with oncologist.  GI to sign off. Available as needed. Please do not hesitate to call regarding questions or concerns.  I personally performed the service.  Management of other medical comorbidities as per primary team  Thank you for allowing [place] to participate in this patient's care. Please don't hesitate to call if any questions or concerns arise.   Jaynie Collins, DO [facility]  Portions of the record may have been created with voice recognition software. Occasional wrong-word or 'sound-a-like' substitutions may have occurred due to the inherent limitations of voice recognition software.  Read the chart carefully and recognize, using context, where substitutions may have occurred.

## 2023-04-27 NOTE — Plan of Care (Signed)

## 2023-04-27 NOTE — Anesthesia Postprocedure Evaluation (Signed)
Anesthesia Post Note  Patient: Derek Webb  Procedure(s) Performed: COLONOSCOPY WITH PROPOFOL POLYPECTOMY GIVENS CAPSULE STUDY  Patient location during evaluation: PACU Anesthesia Type: General Level of consciousness: awake and alert Pain management: pain level controlled Vital Signs Assessment: post-procedure vital signs reviewed and stable Respiratory status: spontaneous breathing, nonlabored ventilation and respiratory function stable Cardiovascular status: blood pressure returned to baseline and stable Postop Assessment: no apparent nausea or vomiting Anesthetic complications: no   No notable events documented.   Last Vitals:  Vitals:   04/27/23 0451 04/27/23 0737  BP: 118/74 105/69  Pulse: 72 71  Resp: 17 16  Temp: 37.2 C 36.8 C  SpO2: 97% 99%    Last Pain:  Vitals:   04/27/23 0451  TempSrc: Oral  PainSc:                  Foye Deer

## 2023-04-27 NOTE — Plan of Care (Signed)
  Problem: Activity: Goal: Risk for activity intolerance will decrease Outcome: Progressing   Problem: Nutrition: Goal: Adequate nutrition will be maintained Outcome: Progressing   Problem: Elimination: Goal: Will not experience complications related to bowel motility Outcome: Progressing Goal: Will not experience complications related to urinary retention Outcome: Progressing   Problem: Pain Managment: Goal: General experience of comfort will improve Outcome: Progressing   Problem: Skin Integrity: Goal: Risk for impaired skin integrity will decrease Outcome: Progressing   Problem: Education: Goal: Knowledge of General Education information will improve Description: Including pain rating scale, medication(s)/side effects and non-pharmacologic comfort measures Outcome: Not Progressing   Problem: Health Behavior/Discharge Planning: Goal: Ability to manage health-related needs will improve Outcome: Not Progressing

## 2023-04-28 ENCOUNTER — Inpatient Hospital Stay: Payer: 59

## 2023-04-28 DIAGNOSIS — C3490 Malignant neoplasm of unspecified part of unspecified bronchus or lung: Secondary | ICD-10-CM | POA: Diagnosis not present

## 2023-04-28 DIAGNOSIS — I77819 Aortic ectasia, unspecified site: Secondary | ICD-10-CM | POA: Diagnosis not present

## 2023-04-28 DIAGNOSIS — D62 Acute posthemorrhagic anemia: Secondary | ICD-10-CM | POA: Diagnosis not present

## 2023-04-28 DIAGNOSIS — I6310 Cerebral infarction due to embolism of unspecified precerebral artery: Secondary | ICD-10-CM | POA: Diagnosis not present

## 2023-04-28 LAB — PHOSPHORUS: Phosphorus: 3.9 mg/dL (ref 2.5–4.6)

## 2023-04-28 LAB — BASIC METABOLIC PANEL
Anion gap: 8 (ref 5–15)
BUN: 7 mg/dL — ABNORMAL LOW (ref 8–23)
CO2: 27 mmol/L (ref 22–32)
Calcium: 8.3 mg/dL — ABNORMAL LOW (ref 8.9–10.3)
Chloride: 104 mmol/L (ref 98–111)
Creatinine, Ser: 0.6 mg/dL — ABNORMAL LOW (ref 0.61–1.24)
GFR, Estimated: >60 mL/min (ref >60)
Glucose, Bld: 101 mg/dL — ABNORMAL HIGH (ref 70–99)
Potassium: 3.7 mmol/L (ref 3.5–5.1)
Sodium: 139 mmol/L (ref 135–145)

## 2023-04-28 LAB — CBC
HCT: 23.7 % — ABNORMAL LOW (ref 39.0–52.0)
Hemoglobin: 7.8 g/dL — ABNORMAL LOW (ref 13.0–17.0)
MCH: 29.4 pg (ref 26.0–34.0)
MCHC: 32.9 g/dL (ref 30.0–36.0)
MCV: 89.4 fL (ref 80.0–100.0)
Platelets: 412 10*3/uL — ABNORMAL HIGH (ref 150–400)
RBC: 2.65 MIL/uL — ABNORMAL LOW (ref 4.22–5.81)
RDW: 17.1 % — ABNORMAL HIGH (ref 11.5–15.5)
WBC: 5.5 10*3/uL (ref 4.0–10.5)
nRBC: 0 % (ref 0.0–0.2)

## 2023-04-28 LAB — MAGNESIUM: Magnesium: 1.7 mg/dL (ref 1.7–2.4)

## 2023-04-28 MED ORDER — PANTOPRAZOLE SODIUM 40 MG PO TBEC: 40.0000 mg | DELAYED_RELEASE_TABLET | Freq: Two times a day (BID) | ORAL | 2 refills | Status: DC

## 2023-04-28 MED ORDER — CYANOCOBALAMIN 1000 MCG PO TABS: 1000.0000 ug | ORAL_TABLET | Freq: Every day | ORAL | 1 refills | Status: AC

## 2023-04-28 MED ORDER — ASCORBIC ACID 500 MG PO TABS: 500.0000 mg | ORAL_TABLET | Freq: Every day | ORAL | 1 refills | Status: AC

## 2023-04-28 MED ORDER — POLYSACCHARIDE IRON COMPLEX 150 MG PO CAPS: 150.0000 mg | ORAL_CAPSULE | Freq: Every day | ORAL | 1 refills | Status: AC

## 2023-04-28 NOTE — Care Management Important Message (Signed)
Important Message  Patient Details  Name: Derek Webb MRN: [id number] Date of Birth: 12/13/1950   Medicare Important Message Given:  Yes     [name] 04/28/2023, 2:16 PM

## 2023-04-28 NOTE — Progress Notes (Signed)
Progress Note   Patient: Derek Webb EXB:[id number] DOB: 07/09/1951 DOA: 04/21/2023     7 DOS: the patient was seen and examined on 04/28/2023   Brief hospital course: Derek Webb is a 72 y.o. male with PMH of CVA, seizures, alcohol use, squamous cell carcinoma of right lung, s/p radiation and started Durvalumab on 08/13/2022, plan for total 12 cycles as per oncology.  PET scan was done as an outpatient which showed no recurrence of cancer.  Hypermetabolic incidental left prostate nodule, patient was seen by urologist Dr. Morrie Sheldon, per note low suspicion for prostate cancer.  Recommended to follow-up in 1 year with urologist, ascending thoracic aortic aneurysm 4.4 cm was diagnosed in August 2023, recommended annual follow-up but patient is not aware, patient has follow-up with GI in 4 weeks, as reviewed from EMR, patient was sent by oncologist for anemia.  Hemoglobin dropped from 11-6.7 so patient was sent to ED for further workup and management.   Assessment and Plan: Symptomatic anemia, unknown cause, no overt GI bleeding Hb 6.7-7.8---7.2--7.5 -7.1--8.3--8.0 --7.8 stable S/p 1 unit PRBC transfused, 2nd u PRBC transfusion given on 9/1 IV PPI transitioned to pantoprazole 40 mg p.o. twice daily Monitor H&H and transfuse if hemoglobin less than 7 s/p EGD found to have mild gastritis, no bleeding.   9/3 s/p colonoscopy: Four subcentimeter polyps removed. Hemorrhoids noted. Diverticulosis throughout the colon.  One of the ascending diverticula appeared to have a clot in it which may be the source of the patient's anemia and dark stool S/p video capsule endoscopy -  GI recommended as capsule did not reach the colon, he will need KUB in a week to assess for capsule passage. GI office will contact him for follow up appointment. Restarted Plavix. He is stable to be discharged home, but RN worried about his unsteady gait. Will get PT/OT recommendations for discharge. Discussed with TOC, HH PT/OT  arranged.   Hypokalemia, potassium repleted. Monitor daily electrolytes Hypomagnesemia, mag repleted. Monitor electrolytes and replete as needed.   Iron deficiency, transferrin saturation 12% Continue oral iron supplement with vitamin C B12 level 373, continue oral supplement.   Hypertension ACEI on hold due to low BP. Monitor BP and titrate medications accordingly Use hydralazine as needed   H/o CVA and seizures Continue seizure precaution Continue Dilantin. Continue Lipitor and Zetia And Plavix.   Ascending thoracic aortic aneurysm Patient denies any chest pain, it seems stable.  Patient was not aware of aneurysm.  It was diagnosed in August 2023 Patient already received IV contrast for CT abdomen so to avoid excessive contrast I would recommend to follow-up with an outpatient to repeat CTA chest as an outpatient.   GERD on PPI   Squamous cell carcinoma of the lung Continue to follow oncologist as per recommendation and continue chemotherapy   Suspected prostate cancer, recommended to follow with the urologist as an outpatient  Consults:  GI Procedures: s/p EGD done on 8/31 S/p  colonoscopy done on 9/3, multiple polyps could be the source of bleeding.   Capsule endoscopy need GI follow up outpatient  for KUB and results.   Diet: Soft diet DVT Prophylaxis: SCD.      Subjective: Patient is seen and examined today morning. He wishes to go home. As he lives alone, PT / OT consulted for disposition plan  Physical Exam: Vitals:   04/27/23 2347 04/28/23 0742 04/28/23 1305 04/28/23 2044  BP: 104/72 (!) 129/95 109/64 126/73  Pulse: 77 68 79 71  Resp: 18  16 16 16   Temp: 98.5 F (36.9 C) 98.2 F (36.8 C) 97.6 F (36.4 C) 98.7 F (37.1 C)  TempSrc:  Oral  Oral  SpO2: 98% 99% 100% 100%  Weight:      Height:       General -Elderly African American male, sitting in no apparent distress HEENT - PERRLA, EOMI, atraumatic head, non tender sinuses. Lung -clear, no rales,  rhonchi Heart - S1, S2 heard, no murmurs, rubs, trace pedal edema Neuro - Alert, awake and oriented x 3, non focal exam. Skin - Warm and dry. Data Reviewed:     Latest Ref Rng & Units 04/28/2023    4:15 AM 04/27/2023    4:11 AM 04/26/2023    3:34 AM  CBC  WBC 4.0 - 10.5 K/uL 5.5  6.0  8.5   Hemoglobin 13.0 - 17.0 g/dL 7.8  8.0  8.3   Hematocrit 39.0 - 52.0 % 23.7  24.2  25.0   Platelets 150 - 400 K/uL 412  386  412        Latest Ref Rng & Units 04/28/2023    4:15 AM 04/27/2023    4:11 AM 04/26/2023    3:34 AM  BMP  Glucose 70 - 99 mg/dL 161  89  096   BUN 8 - 23 mg/dL 7  <5  5   Creatinine 0.45 - 1.24 mg/dL 4.09  8.11  9.14   Sodium 135 - 145 mmol/L 139  139  139   Potassium 3.5 - 5.1 mmol/L 3.7  3.7  3.3   Chloride 98 - 111 mmol/L 104  107  107   CO2 22 - 32 mmol/L 27  26  24    Calcium 8.9 - 10.3 mg/dL 8.3  8.2  8.4    No results found.   Family Communication: Patient understands current care plan.  Disposition: Status is: Inpatient Remains inpatient appropriate because: GI work up  Planned Discharge Destination: Home with Home Health    Time spent:  Author: Marcelino Duster, MD 04/28/2023 8:48 PM  For on call review [email or web address].

## 2023-04-28 NOTE — TOC Transition Note (Addendum)
Transition of Care [facility]) - CM/SW Discharge Note   Patient Details  Name: Derek Webb MRN: [id number] Date of Birth: [birth year]  [facility] CM/SW Contact:  Truddie Hidden, RN Phone Number: 04/28/2023, 4:18 PM   Clinical Narrative:    Spoke with patient regarding discharge today. He is agreeable to PT and OT, and does not have a choice of an agency. Spoke with patient's  sister, she will transport him home. Nurse notified.   Referral sent and accepted by Barbara Cower from [facility].   TOC signing off.             Patient Goals and CMS Choice      Discharge Placement                         Discharge Plan and Services Additional resources added to the After Visit Summary for                                       Social Determinants of Health (SDOH) Interventions SDOH Screenings   Food Insecurity: No Food Insecurity (04/22/2023)  Housing: Low Risk  (04/22/2023)  Transportation Needs: No Transportation Needs (04/22/2023)  Utilities: Not At Risk (04/22/2023)  Alcohol Screen: Low Risk  (06/15/2022)  Depression (PHQ2-9): Low Risk  (03/03/2023)  Financial Resource Strain: Low Risk  (11/28/2020)  Physical Activity: Inactive (06/15/2022)  Social Connections: Socially Isolated (06/15/2022)  Stress: Stress Concern Present (06/15/2022)  Tobacco Use: Medium Risk (04/27/2023)   Received from [facility]     Readmission Risk Interventions     No data to display

## 2023-04-29 ENCOUNTER — Inpatient Hospital Stay: Payer: 59

## 2023-04-29 DIAGNOSIS — K5791 Diverticulosis of intestine, part unspecified, without perforation or abscess with bleeding: Secondary | ICD-10-CM

## 2023-04-29 DIAGNOSIS — E611 Iron deficiency: Secondary | ICD-10-CM

## 2023-04-29 DIAGNOSIS — D62 Acute posthemorrhagic anemia: Secondary | ICD-10-CM | POA: Diagnosis not present

## 2023-04-29 DIAGNOSIS — I77819 Aortic ectasia, unspecified site: Secondary | ICD-10-CM | POA: Diagnosis not present

## 2023-04-29 DIAGNOSIS — E538 Deficiency of other specified B group vitamins: Secondary | ICD-10-CM

## 2023-04-29 DIAGNOSIS — E876 Hypokalemia: Secondary | ICD-10-CM

## 2023-04-29 DIAGNOSIS — C3490 Malignant neoplasm of unspecified part of unspecified bronchus or lung: Secondary | ICD-10-CM | POA: Diagnosis not present

## 2023-04-29 DIAGNOSIS — I10 Essential (primary) hypertension: Secondary | ICD-10-CM | POA: Diagnosis not present

## 2023-04-29 LAB — CBC
HCT: 24.4 % — ABNORMAL LOW (ref 39.0–52.0)
Hemoglobin: 8.1 g/dL — ABNORMAL LOW (ref 13.0–17.0)
MCH: 29.8 pg (ref 26.0–34.0)
MCHC: 33.2 g/dL (ref 30.0–36.0)
MCV: 89.7 fL (ref 80.0–100.0)
Platelets: 451 10*3/uL — ABNORMAL HIGH (ref 150–400)
RBC: 2.72 MIL/uL — ABNORMAL LOW (ref 4.22–5.81)
RDW: 17.1 % — ABNORMAL HIGH (ref 11.5–15.5)
WBC: 5.9 10*3/uL (ref 4.0–10.5)
nRBC: 0 % (ref 0.0–0.2)

## 2023-04-29 LAB — BASIC METABOLIC PANEL
Anion gap: 6 (ref 5–15)
BUN: 5 mg/dL — ABNORMAL LOW (ref 8–23)
CO2: 27 mmol/L (ref 22–32)
Calcium: 8.3 mg/dL — ABNORMAL LOW (ref 8.9–10.3)
Chloride: 107 mmol/L (ref 98–111)
Creatinine, Ser: 0.57 mg/dL — ABNORMAL LOW (ref 0.61–1.24)
GFR, Estimated: >60 mL/min (ref >60)
Glucose, Bld: 94 mg/dL (ref 70–99)
Potassium: 3.5 mmol/L (ref 3.5–5.1)
Sodium: 140 mmol/L (ref 135–145)

## 2023-04-29 MED ORDER — HEPARIN SODIUM (PORCINE) 1000 UNIT/ML IJ SOLN
1000.0000 [IU] | Freq: Once | INTRAMUSCULAR | Status: DC
  Filled 2023-04-29: qty 1

## 2023-04-29 MED ORDER — HEPARIN SOD (PORK) LOCK FLUSH 100 UNIT/ML IV SOLN
100.0000 [IU] | Freq: Once | INTRAVENOUS | Status: AC
  Administered 2023-04-29: 100 [IU] via INTRAVENOUS
  Filled 2023-04-29: qty 5

## 2023-04-29 NOTE — Evaluation (Addendum)
Physical Therapy Evaluation Patient Details Name: Derek Webb MRN: [id number] DOB: [birth year] Today's Date: 04/29/2023  History of Present Illness  Derek Webb is a 72 y.o. male with PMH of CVA, seizures, alcohol use, squamous cell carcinoma of right lung, s/p radiation and started Durvalumab on 08/13/2022, plan for total 12 cycles as per oncology.  PET scan was done as an outpatient which showed no recurrence of cancer.  Hypermetabolic incidental left prostate nodule, patient was seen by urologist Dr. Morrie Sheldon, per note low suspicion for prostate cancer.  Recommended to follow-up in 1 year with urologist, ascending thoracic aortic aneurysm 4.4 cm was diagnosed in August 2023, recommended annual follow-up but patient is not aware, patient has follow-up with GI in 4 weeks, as reviewed from EMR, patient was sent by oncologist for anemia.  Hemoglobin dropped from 11-6.7 so patient was sent to ED for further workup and management.  Clinical Impression   Pt presents seated EOB in room, no complaints of pain. He is able to perform sit<>stand modi and ambulate ~442ft with CGA for safety (overall not needed). He completed DGI, scoring 22/24 and only slight deficit with changes in gait speed but no LOB noted throughout. Pt voiced during session he feels he is at his baseline mobility with no present concerns. PT to discharge orders at this time and re-evaluate if change in functional status occurs.       If plan is discharge home, recommend the following:     Can travel by private vehicle        Equipment Recommendations None recommended by PT  Recommendations for Other Services       Functional Status Assessment Patient has not had a recent decline in their functional status     Precautions / Restrictions Precautions Precautions: Fall (aspiration, seizure) Restrictions Weight Bearing Restrictions: No      Mobility  Bed Mobility               General bed mobility comments: did  not observe bed mobility during session    Transfers Overall transfer level: Modified independent Equipment used: None               General transfer comment: observed sit<>stand    Ambulation/Gait Ambulation/Gait assistance: Contact guard assist Gait Distance (Feet): 400 Feet Assistive device: None   Gait velocity: WFL     General Gait Details: extreme B/L hip ER (baseline)  Stairs            Wheelchair Mobility     Tilt Bed    Modified Rankin (Stroke Patients Only)       Balance Overall balance assessment: Needs assistance Sitting-balance support: No upper extremity supported, Feet supported Sitting balance-Leahy Scale: Normal     Standing balance support: No upper extremity supported, During functional activity Standing balance-Leahy Scale: Good                   Standardized Balance Assessment Standardized Balance Assessment : Dynamic Gait Index   Dynamic Gait Index Level Surface: Normal Change in Gait Speed: Mild Impairment Gait with Horizontal Head Turns: Normal Gait with Vertical Head Turns: Normal Gait and Pivot Turn: Normal Step Over Obstacle: Normal Step Around Obstacles: Normal Steps: Mild Impairment Total Score: 22       Pertinent Vitals/Pain Pain Assessment Pain Assessment: No/denies pain    Home Living Family/patient expects to be discharged to:: Private residence Living Arrangements: Alone   Type of Home: House Home Access: Level entry  Home Layout: One level Home Equipment: Cane - single point      Prior Function Prior Level of Function : Independent/Modified Independent (Pt does present with cognitive deficits)             Mobility Comments: does not use AD at baseline ADLs Comments: Refer to OT note for ADL comments     Extremity/Trunk Assessment   Upper Extremity Assessment Upper Extremity Assessment: Overall WFL for tasks assessed    Lower Extremity Assessment Lower Extremity  Assessment: Overall WFL for tasks assessed       Communication   Communication Communication: Difficulty following commands/understanding Following commands: Follows one step commands inconsistently;Follows multi-step commands inconsistently  Cognition Arousal: Alert Behavior During Therapy: WFL for tasks assessed/performed Overall Cognitive Status: No family/caregiver present to determine baseline cognitive functioning                                 General Comments: inconsistent responding when asked questions/ therapist says name. Required multiple attempts to get attention at the end of session        General Comments      Exercises     Assessment/Plan    PT Assessment Patient does not need any further PT services  PT Problem List         PT Treatment Interventions      PT Goals (Current goals can be found in the Care Plan section)  Acute Rehab PT Goals Patient Stated Goal: return home PT Goal Formulation: With patient Time For Goal Achievement: 05/13/23 Potential to Achieve Goals: Good    Frequency       Co-evaluation               AM-PAC PT "6 Clicks" Mobility  Outcome Measure Help needed turning from your back to your side while in a flat bed without using bedrails?: None Help needed moving from lying on your back to sitting on the side of a flat bed without using bedrails?: None Help needed moving to and from a bed to a chair (including a wheelchair)?: None Help needed standing up from a chair using your arms (e.g., wheelchair or bedside chair)?: None Help needed to walk in hospital room?: None Help needed climbing 3-5 steps with a railing? : A Little 6 Click Score: 23    End of Session Equipment Utilized During Treatment: Gait belt Activity Tolerance: Patient tolerated treatment well Patient left: in bed;with call bell/phone within reach;Other (comment) (seated EOB)   PT Visit Diagnosis: Other abnormalities of gait and mobility  (R26.89)    Time: 1027-2536 PT Time Calculation (min) (ACUTE ONLY): 12 min   Charges:   PT Evaluation $PT Eval Low Complexity: 1 Low PT Treatments $Therapeutic Activity: 8-22 mins PT General Charges $$ ACUTE PT VISIT: 1 Visit        Landynn Dupler, PT, SPT 3:29 PM,04/29/23

## 2023-04-29 NOTE — Discharge Summary (Incomplete)
Physician Discharge Summary   Patient: Derek Webb MRN: [id number] DOB: 09-18-1950  Admit date:     04/21/2023  Discharge date: 04/29/23  Discharge Physician: Marcelino Duster   PCP: Carlean Jews, PA-C   Recommendations at discharge:  {Tip this will not be part of the note when signed- Example include specific recommendations for outpatient follow-up, pending tests to follow-up on. (Optional):26781}  PCP follow up in 1 week. GI follow up as suggested.  Discharge Diagnoses: Principal Problem:   Anemia Active Problems:   Squamous cell lung cancer (HCC)   Cerebrovascular accident (CVA) (HCC)   Essential hypertension   GERD (gastroesophageal reflux disease)   Seizures (HCC)   Aortic dilatation (HCC)  Resolved Problems:   * No resolved hospital problems. [facility] Course: and started Durvalumab on 08/13/2022, plan for total 12 cycles as per oncology.  PET scan was done as an outpatient which showed no recurrence of cancer.  Hypermetabolic incidental left prostate nodule, patient was seen by urologist Dr. Morrie Sheldon, per note low suspicion for prostate cancer.  Recommended to follow-up in 1 year with urologist, ascending thoracic aortic aneurysm 4.4 cm was diagnosed in August 2023, recommended annual follow-up but patient is not aware, patient has follow-up with GI in 4 weeks, as reviewed from EMR, patient was sent by oncologist for anemia.  Hemoglobin dropped from 11-6.7 so patient was sent to ED for further workup and management.    Assessment and Plan: Symptomatic anemia, unknown cause, no overt GI bleeding Hb 6.7-7.8---7.2--7.5 -7.1--8.3--8.0 --7.8 stable S/p 1 unit PRBC transfused, 2nd u PRBC transfusion given on 9/1 IV PPI transitioned to pantoprazole 40 mg p.o. twice daily Monitor H&H and transfuse if hemoglobin less than 7 s/p EGD found to have mild gastritis, no bleeding.   9/3 s/p colonoscopy: Four subcentimeter polyps removed. Hemorrhoids noted. Diverticulosis  throughout the colon.  One of the ascending diverticula appeared to have a clot in it which may be the source of the patient's anemia and dark stool S/p video capsule endoscopy -  GI recommended as capsule did not reach the colon, he will need KUB in a week to assess for capsule passage. GI office will contact him for follow up appointment. Restarted Plavix. He is stable to be discharged home, but RN worried about his unsteady gait. Will get PT/OT recommendations for discharge. Discussed with TOC, HH PT/OT arranged.   Hypokalemia, potassium repleted. Monitor daily electrolytes Hypomagnesemia, mag repleted. Monitor electrolytes and replete as needed.   Iron deficiency, transferrin saturation 12% Continue oral iron supplement with vitamin C B12 level 373, continue oral supplement.   Hypertension ACEI on hold due to low BP. Monitor BP and titrate medications accordingly Use hydralazine as needed   H/o CVA and seizures Continue seizure precaution Continue Dilantin. Continue Lipitor and Zetia And Plavix.   Ascending thoracic aortic aneurysm Patient denies any chest pain, it seems stable.  Patient was not aware of aneurysm.  It was diagnosed in August 2023 Patient already received IV contrast for CT abdomen so to avoid excessive contrast I would recommend to follow-up with an outpatient to repeat CTA chest as an outpatient.   GERD on PPI   Squamous cell carcinoma of the lung Continue to follow oncologist as per recommendation and continue chemotherapy   Suspected prostate cancer, recommended to follow with the urologist as an outpatient   Consults:  GI Procedures: s/p EGD done on 8/31 S/p  colonoscopy done on 9/3, multiple polyps could be the source of  bleeding.   Capsule endoscopy need GI follow up outpatient  for KUB and results.    {Tip this will not be part of the note when signed Body mass index is 29.65 kg/m. , ,  (Optional):26781}  {(NOTE) Pain control PDMP Statment  (Optional):26782} Consultants: GI Procedures performed: EGD, Colonoscopy, Capsule video endoscopy Disposition: Home health Diet recommendation:  Discharge Diet Orders (From admission, onward)     Start     Ordered   04/28/23 0000  Diet - low sodium heart healthy        04/28/23 1502           Cardiac diet DISCHARGE MEDICATION: Allergies as of 04/29/2023   No Known Allergies      Medication List     TAKE these medications    ascorbic acid 500 MG tablet Commonly known as: VITAMIN C Take 1 tablet (500 mg total) by mouth daily.   aspirin 81 MG tablet Take 81 mg by mouth daily.   atorvastatin 80 MG tablet Commonly known as: LIPITOR Take 1 tablet (80 mg total) by mouth daily.   budesonide-formoterol 160-4.5 MCG/ACT inhaler Commonly known as: SYMBICORT Inhale 1 puff into the lungs 2 (two) times daily.   clopidogrel 75 MG tablet Commonly known as: PLAVIX Take 1 tablet (75 mg total) by mouth daily.   cyanocobalamin 1000 MCG tablet Take 1 tablet (1,000 mcg total) by mouth daily.   enalapril 20 MG tablet Commonly known as: VASOTEC Take 1 tablet (20 mg total) by mouth daily.   ezetimibe 10 MG tablet Commonly known as: ZETIA Take 1 tablet (10 mg total) by mouth daily.   iron polysaccharides 150 MG capsule Commonly known as: NIFEREX Take 1 capsule (150 mg total) by mouth daily.   pantoprazole 40 MG tablet Commonly known as: PROTONIX Take 1 tablet (40 mg total) by mouth 2 (two) times daily. IN AM What changed: when to take this   phenytoin 100 MG ER capsule Commonly known as: DILANTIN Take 4 capsules (400 mg total) by mouth at bedtime.   Vitamin D 50 MCG (2000 UT) Caps Take by mouth. VITAMIN D 3 2000 IU  ONE SOFTGEL DAILY  --OTC        Follow-up Information     Jaynie Collins, DO. Schedule an appointment as soon as possible for a visit in 1 week(s).   Specialty: Gastroenterology Why: The office will contact you to make this appointment.  Thanks! Contact information: [address] [facility] [place] [address] [phone]                Discharge Exam: Ceasar Mons Weights   04/21/23 1624 04/21/23 2141 04/29/23 0319  Weight: 84 kg 82.9 kg 85.9 kg   General -Elderly African American male, sitting in no apparent distress HEENT - PERRLA, EOMI, atraumatic head, non tender sinuses. Lung -clear, no rales, rhonchi Heart - S1, S2 heard, no murmurs, rubs, trace pedal edema Neuro - Alert, awake and oriented x 3, non focal exam. Skin - Warm and dry.  Condition at discharge: stable  The results of significant diagnostics from this hospitalization (including imaging, microbiology, ancillary and laboratory) are listed below for reference.   Imaging Studies: CT HEAD WO CONTRAST ( )  Result Date: 04/24/2023 CLINICAL Webb:  Initial evaluation for mental status change, unknown cause. EXAM: CT HEAD WITHOUT CONTRAST TECHNIQUE: Contiguous axial images were obtained from the base of the skull through the vertex without intravenous contrast. RADIATION DOSE REDUCTION: This exam was performed according to  the departmental dose-optimization program which includes automated exposure control, adjustment of the mA and/or kV according to patient size and/or use of iterative reconstruction technique. COMPARISON:  Prior MRI from 02/07/2023. FINDINGS: Brain: Generalized age-related cerebral atrophy. Multiple chronic infarcts involving the left frontal lobe and bilateral parietal lobes, stable. Probable few small remote lacunar infarcts about the bilateral basal ganglia, also unchanged. No acute intracranial hemorrhage. No acute large vessel territory infarct. No mass lesion, midline shift or mass effect. No hydrocephalus or extra-axial fluid collection. Vascular: No abnormal hyperdense vessel. Scattered calcified atherosclerosis present at the skull base. Skull: Scalp soft tissues demonstrate no acute finding. Calvarium intact.  Sinuses/Orbits: Globes orbital soft tissues within normal limits. Paranasal sinuses are clear. No significant mastoid effusion. Other: None. IMPRESSION: 1. No acute intracranial abnormality. 2. Multiple chronic infarcts as above, stable. Electronically Signed   By: Rise Mu M.D.   On: 04/24/2023 02:10   CT ANGIO GI BLEED  Result Date: 04/21/2023 CLINICAL Webb:  ABLA Concern for GI bleeding. Has received Iron infusion this week, drop in HGB from 11 to today of 6.7. Prior non-small cell lung cancer. EXAM: CTA ABDOMEN AND PELVIS WITHOUT AND WITH CONTRAST TECHNIQUE: Multidetector CT imaging of the abdomen and pelvis was performed using the standard protocol during bolus administration of intravenous contrast. Multiplanar reconstructed images and MIPs were obtained and reviewed to evaluate the vascular anatomy. RADIATION DOSE REDUCTION: This exam was performed according to the departmental dose-optimization program which includes automated exposure control, adjustment of the mA and/or kV according to patient size and/or use of iterative reconstruction technique. CONTRAST:  OMNIPAQUE IOHEXOL 350 MG/ML SOLN COMPARISON:  CT chest 01/19/2023. CT chest 01/26/2017, CT chest 06/27/2012, pet ct 04/08/22 FINDINGS: VASCULAR No extravasation of intravenous contrast noted within the lumen of the bowel. Aorta: Severe atherosclerotic plaque. Normal caliber aorta without aneurysm, dissection, vasculitis or significant stenosis. Celiac: Mild atherosclerotic plaque. Patent without evidence of aneurysm, dissection, vasculitis or significant stenosis. SMA: Mild atherosclerotic plaque. Patent without evidence of aneurysm, dissection, vasculitis or significant stenosis. Renals: Moderate atherosclerotic plaque. Both renal arteries are patent without evidence of aneurysm, dissection, vasculitis, fibromuscular dysplasia or significant stenosis. IMA: Mild atherosclerotic plaque. Patent without evidence of aneurysm,  dissection, vasculitis or significant stenosis. Inflow: Moderate to severe atherosclerotic plaque. Patent without evidence of aneurysm, dissection, vasculitis or significant stenosis. Proximal Outflow:Marland Kitchen Moderate severe atherosclerotic plaque peer bilateral common femoral and visualized portions of the superficial and profunda femoral arteries are patent without evidence of aneurysm, dissection, vasculitis or significant stenosis. Veins: The portal, splenic, superior mesenteric veins are patent. Review of the MIP images confirms the above findings. NON-VASCULAR Lower chest: Interval development of a partially visualized subpleural indeterminate triangular shaped left upper lobe 3 mm pulmonary nodule (6:4). Cardiac changes suggestive of anemia. Hepatobiliary: There is a chronic 2.1 cm left hepatic lobe peripherally discontinuously enhancing mass on articular view that appears to be more centrally filled-in on venous view suggestive of a hepatic hemangioma. No gallstones, gallbladder wall thickening, or pericholecystic fluid. No biliary dilatation. Pancreas: No focal lesion. Normal pancreatic contour. No surrounding inflammatory changes. No main pancreatic ductal dilatation. Spleen: Normal in size without focal abnormality. Adrenals/Urinary Tract: Chronic 2.2 cm left adrenal gland nodule with a density of 10 Hounsfield units on noncontrast imaging and 34 Hounsfield units on arterial imaging suggestive an adrenal gland adenoma. Chronic 1.5 cm right adrenal gland nodule with a density 23 Hounsfield units on noncontrast imaging and 42 Hounsfield unit on arterial imaging suggestive an adrenal adenoma.  No further follow-up of adrenal gland nodules given chronicity. Bilateral kidneys enhance symmetrically. Right renal cortical scarring with possible partial nephrectomy. No nephroureterolithiasis bilaterally. No hydronephrosis. No hydroureter. The urinary bladder is unremarkable. Stomach/Bowel: Stomach is within normal  limits. No evidence of bowel wall thickening or dilatation. Colonic diverticulosis appendix appears normal. Lymphatic: No lymphadenopathy. Chronic calcified porta hepatic lymph node (10:33). Reproductive: Prostate is prominent size measuring up to 4.5 cm. Other: No intraperitoneal free fluid. No intraperitoneal free gas. No organized fluid collection. Musculoskeletal: No abdominal wall hernia or abnormality. No suspicious lytic or blastic osseous lesions. No acute displaced fracture. Multilevel degenerative changes of the spine. Similar-appearing sclerotic like appearance of the L3 through S1 levels. Grade 1 anterolisthesis of L3 on L4. IMPRESSION: VASCULAR 1. No CT evidence of a gastrointestinal hemorrhage. 2.  Aortic Atherosclerosis (ICD10-I70.0). NON-VASCULAR 1. Interval development of a partially visualized indeterminate triangular shaped left upper lobe 3 mm pulmonary nodule morphology is suggestive of an intrapulmonary lymph node. 2. Chronic 2.1 cm left hepatic lobe lesion suggestive of a hepatic hemangioma. 3. Colonic diverticulosis with no acute diverticulitis. Electronically Signed   By: Tish Frederickson M.D.   On: 04/21/2023 20:00   CT ABDOMEN PELVIS WO CONTRAST  Result Date: 04/19/2023 CLINICAL Webb:  Stage III right upper lobe squamous cell carcinoma. Staging. * Tracking Code: BO * EXAM: CT ABDOMEN AND PELVIS WITHOUT CONTRAST TECHNIQUE: Multidetector CT imaging of the abdomen and pelvis was performed following the standard protocol without IV contrast. RADIATION DOSE REDUCTION: This exam was performed according to the departmental dose-optimization program which includes automated exposure control, adjustment of the mA and/or kV according to patient size and/or use of iterative reconstruction technique. COMPARISON:  02/08/2023 PET. No recent comparison abdominopelvic CTs. FINDINGS: Lower chest: Emphysema. Bibasilar scarring. Normal heart size without pericardial or pleural effusion. Hepatobiliary:  Segment 2 hypoattenuating 1.8 cm lesion on 11/2 is similar to on the prior PET and subtly present on 04/08/2022 PET. Not hypermetabolic on those exams. Normal gallbladder, without biliary ductal dilatation. Pancreas: Normal, without mass or ductal dilatation. Spleen: Normal in size, without focal abnormality. Adrenals/Urinary Tract: Bilateral adrenal thickening is chronic, likely due to hyperplasia. No renal calculi or hydronephrosis. No hydroureter or ureteric calculi. No bladder calculi. Stomach/Bowel: Normal stomach, without wall thickening. Scattered colonic diverticula. Normal terminal ileum. Normal small bowel. Vascular/Lymphatic: Aortic atherosclerosis. Calcified nodes in the porta hepatis likely relate to old granulomatous disease. No abdominopelvic adenopathy. Reproductive: Mild prostatomegaly. Other: No significant free fluid. No free intraperitoneal air. No evidence of omental or peritoneal disease. Musculoskeletal: Advanced lumbosacral spondylosis. Trace L3-4 anterolisthesis. IMPRESSION: 1. Mildly decreased sensitivity secondary to lack of of oral or IV contrast. 2. No evidence of abdominopelvic metastasis. 3. 1.8 cm hypoattenuating segment 2 liver lesion is present back to 04/08/2022 PET and was not hypermetabolic on 02/08/2023 PET, favoring a benign etiology. When compared back to a contrast-enhanced CT of 03/02/2018, vague enhancement in this area suggests underlying hemangioma. 4. Incidental findings, including: Aortic atherosclerosis (ICD10-I70.0) and emphysema (ICD10-J43.9). Prostatomegaly. Electronically Signed   By: Jeronimo Greaves M.D.   On: 04/19/2023 15:18    Microbiology: Results for orders placed or performed during the hospital encounter of 05/10/22  SARS CORONAVIRUS 2 (TAT 6-24 HRS) Anterior Nasal Swab     Status: None   Collection Time: 05/10/22 10:19 AM   Specimen: Anterior Nasal Swab  Result Value Ref Range Status   SARS Coronavirus 2 NEGATIVE NEGATIVE Final    Comment:  (NOTE) SARS-CoV-2 target nucleic acids are  NOT DETECTED.  The SARS-CoV-2 RNA is generally detectable in upper and lower respiratory specimens during the acute phase of infection. Negative results do not preclude SARS-CoV-2 infection, do not rule out co-infections with other pathogens, and should not be used as the sole basis for treatment or other patient management decisions. Negative results must be combined with clinical observations, patient history, and epidemiological information. The expected result is Negative.  Fact Sheet for Patients: HairSlick.no  Fact Sheet for Healthcare Providers: quierodirigir.com  This test is not yet approved or cleared by the [place] FDA and  has been authorized for detection and/or diagnosis of SARS-CoV-2 by FDA under an Emergency Use Authorization (EUA). This EUA will remain  in effect (meaning this test can be used) for the duration of the COVID-19 declaration under Se ction 564(b)(1) of the Act, 21 U.S.C. section 360bbb-3(b)(1), unless the authorization is terminated or revoked sooner.  Performed at [facility], [address] [place], [place] [address]     Labs: CBC: Recent Labs  Lab 04/25/23 1225 04/26/23 0334 04/27/23 0411 04/28/23 0415 04/29/23 0635  WBC 7.5 8.5 6.0 5.5 5.9  HGB 8.8* 8.3* 8.0* 7.8* 8.1*  HCT 26.6* 25.0* 24.2* 23.7* 24.4*  MCV 89.6 89.0 90.0 89.4 89.7  PLT 446* 412* 386 412* 451*   Basic Metabolic Panel: Recent Labs  Lab 04/23/23 0514 04/24/23 0244 04/25/23 1225 04/26/23 0334 04/27/23 0411 04/28/23 0415 04/29/23 0635  NA 138   < > 140 139 139 139 140  K 4.0   < > 3.5 3.3* 3.7 3.7 3.5  CL 106   < > 107 107 107 104 107  CO2 26   < > 27 24 26 27 27   GLUCOSE 84   < > 84 115* 89 101* 94  BUN 7*   < > 5* 5* <5* 7* 5*  CREATININE 0.55*   < > 0.65 0.69 0.55* 0.60* 0.57*  CALCIUM 8.5*   < > 8.6* 8.4* 8.2* 8.3* 8.3*  MG 2.0  --  1.7 1.5*  1.8 1.7  --   PHOS 3.1  --  3.0 3.4 3.9 3.9  --    < > = values in this interval not displayed.   Liver Function Tests: No results for input(s): "AST", "ALT", "ALKPHOS", "BILITOT", "PROT", "ALBUMIN" in the last 168 hours. CBG: Recent Labs  Lab 04/24/23 0038  GLUCAP 112*    Discharge time spent: 39 minutes.  Signed: Marcelino Duster, MD [facility] 04/29/2023

## 2023-04-29 NOTE — Evaluation (Signed)
Occupational Therapy Evaluation Patient Details Name: Derek Webb MRN: [id number] DOB: 1951/08/17 Today's Date: 04/29/2023   History of Present Illness Derek Webb is a 72 y.o. male with PMH of CVA, seizures, alcohol use, squamous cell carcinoma of right lung, s/p radiation and started Durvalumab on 08/13/2022, plan for total 12 cycles as per oncology.  PET scan was done as an outpatient which showed no recurrence of cancer.  Hypermetabolic incidental left prostate nodule, patient was seen by urologist Dr. Morrie Sheldon, per note low suspicion for prostate cancer.  Recommended to follow-up in 1 year with urologist, ascending thoracic aortic aneurysm 4.4 cm was diagnosed in August 2023, recommended annual follow-up but patient is not aware, patient has follow-up with GI in 4 weeks, as reviewed from EMR, patient was sent by oncologist for anemia.  Hemoglobin dropped from 11-6.7 so patient was sent to ED for further workup and management.   Clinical Impression   Pt received in bed, agreeable to OT eval. PTA, pt lives alone, reports no falls and was MOD I - IND for ADLs and mobility without AD. Pt's biggest barrier to efficient occupational performance is impaired cognition with non-familier topics/tasks. Performs bed mobility MOD I, stands at sink for grooming task with CGA/close supervision, no LOB, reaches over to take drink out of water fountain and makes bed with CGA/close supervision. OT attempts to perform Short Blessed Test, with pt shaking head, saying "I can't do that" when asked to memorize name/address. Completes two laps around unit with RW and close supervision. OT removes AD and pt completes activities to challenge dynamic balance and functional mobility (see flowsheet for further details). Could not formally assess strength UE/LE as pt demos increased difficulty understanding directions even with multimodal cuing. Recommending increased supervision at home for cognitive impairments, including  assist for med mgmt and IADLs such as cooking (pt reports sister performs these).  MD visited room during OT eval, RN entering room to administer meds as OT left. Pt left eating breakfast sitting EOB, alarm activated, needs within reach. Patient will benefit from acute OT to increase overall independence in the areas of ADLs, functional mobility, functional cognition for novel task performance in order to safely discharge.       If plan is discharge home, recommend the following: Assistance with cooking/housework;Direct supervision/assist for medications management;Direct supervision/assist for financial management;Assist for transportation;Supervision due to cognitive status;A little help with walking and/or transfers;A little help with bathing/dressing/bathroom (recommending increased support for cognitive impairments for non-familier tasks)    Functional Status Assessment  Patient has had a recent decline in their functional status and/or demonstrates limited ability to make significant improvements in function in a reasonable and predictable amount of time  Equipment Recommendations  None recommended by OT       Precautions / Restrictions Precautions Precautions: Fall (aspiration, seizure) Restrictions Weight Bearing Restrictions: No      Mobility Bed Mobility Overal bed mobility: Modified Independent                  Transfers Overall transfer level: Needs assistance Equipment used: None Transfers: Sit to/from Stand Sit to Stand: Contact guard assist, Supervision           General transfer comment: stands quickly from EOB no AD      Balance Overall balance assessment: Needs assistance Sitting-balance support: No upper extremity supported, Feet supported Sitting balance-Leahy Scale: Normal     Standing balance support: No upper extremity supported, During functional activity Standing balance-Leahy Scale: Good  Standing balance comment: can weight shift, stands  briefly on one LE, difficulties performing tandem stance but can maintain static standing with eyes closed             High level balance activites: Sudden stops, Head turns, Direction changes High Level Balance Comments: Increased effort to perform; pt difficulty following commands even with multimodal cuing and increased time. Completes 2 laps around unit with RW, with head turns, changing directions, no LOB. OT removes AD, and pt completes ~50 ft with close supervision/cga (stops, changes direction). With attempts to increase ambulation speed, pt cannot perform           ADL either performed or assessed with clinical judgement   ADL Overall ADL's : Needs assistance/impaired     Grooming: Standing;Wash/dry face;Wash/dry hands                               Functional mobility during ADLs: Contact guard assist;Supervision/safety;Rolling walker (2 wheels) (Completes 2 laps around unit with CGA/SBA using RW. No LOB noted with turns; able to converse while walking) General ADL Comments: Pt's biggest barrier to efficient occupational performance is impaired cognition with non-familier topics/tasks. Performs bed mobility MOD I, stands at sink for grooming task with CGA/close supervision, no LOB,  reaches over to take drink out of water fountain and makes bed with CGA/close supervision. OT attempts to perform Short Blessed Test, with pt shaking head, saying "I can't do that" when asked to memorize name/address. Recommending increased supervision at home for cognitive impairments, including assist for med mgmt and IADLs such as cooking (pt reports sister performs these).     Vision Baseline Vision/History: 1 Wears glasses (reading glasses) Ability to See in Adequate Light: 0 Adequate Patient Visual Report: Other (comment) (reads small print and board on wall)              Pertinent Vitals/Pain Pain Assessment Pain Assessment: No/denies pain     Extremity/Trunk Assessment  Upper Extremity Assessment Upper Extremity Assessment: Difficult to assess due to impaired cognition;Defer to OT evaluation (multiple attempts made to formally assess UE strength, pt unable to follow commands to test.)   Lower Extremity Assessment Lower Extremity Assessment: Overall WFL for tasks assessed   Cervical / Trunk Assessment Cervical / Trunk Assessment: Normal   Communication Communication Communication: Difficulty following commands/understanding Following commands: Follows one step commands inconsistently;Follows multi-step commands inconsistently   Cognition Arousal: Alert Behavior During Therapy: WFL for tasks assessed/performed Overall Cognitive Status: No family/caregiver present to determine baseline cognitive functioning                                 General Comments: difficulties following 1-step commands (could not perform MMT UE assessment), but performs self-care routines with increased time. Slightly confused with slow processing overall but pleasant.                Home Living Family/patient expects to be discharged to:: Private residence Living Arrangements: Alone Available Help at Discharge: Available PRN/intermittently;Family (Sister lives behind him) Type of Home: House Home Access: Level entry     Home Layout: One level     Bathroom Shower/Tub: Chief Strategy Officer: Standard Bathroom Accessibility: Yes   Home Equipment: Cane - single point          Prior Functioning/Environment Prior Level of Function : Independent/Modified Independent (Pt does  present with cognitive deficits)             Mobility Comments: no AD, no falls past 6 mo ADLs Comments: IND, sister does IADLs (drives, grocery shopping + meds).        OT Problem List: Impaired balance (sitting and/or standing);Decreased cognition;Decreased safety awareness      OT Treatment/Interventions: Self-care/ADL training;Balance training;Cognitive  remediation/compensation    OT Goals(Current goals can be found in the care plan section) Acute Rehab OT Goals OT Goal Formulation: With patient Time For Goal Achievement: 05/13/23 Potential to Achieve Goals: Good  OT Frequency: Min 1X/week       AM-PAC OT "6 Clicks" Daily Activity     Outcome Measure Help from another person eating meals?: None Help from another person taking care of personal grooming?: None Help from another person toileting, which includes using toliet, bedpan, or urinal?: A Little Help from another person bathing (including washing, rinsing, drying)?: A Little Help from another person to put on and taking off regular upper body clothing?: None Help from another person to put on and taking off regular lower body clothing?: None 6 Click Score: 22   End of Session Equipment Utilized During Treatment: Gait belt;Rolling walker (2 wheels) Nurse Communication: Mobility status  Activity Tolerance: Patient tolerated treatment well Patient left: in bed;with nursing/sitter in room;with bed alarm set;with call bell/phone within reach  OT Visit Diagnosis: Muscle weakness (generalized) (M62.81)                Time: 1478-2956 OT Time Calculation (min): 35 min Charges:  OT General Charges $OT Visit: 1 Visit OT Evaluation $OT Eval Low Complexity: 1 Low OT Treatments $Self Care/Home Management : 8-22 mins  Tayen Narang L. Jeily Guthridge, OTR/L  04/29/23, 12:05 PM

## 2023-04-29 NOTE — TOC Transition Note (Addendum)
Transition of Care [facility]) - CM/SW Discharge Note   Patient Details  Name: Derek Webb MRN: [id number] Date of Birth: 1951-04-20  Transition [facility] CM/SW Contact:  Truddie Hidden, RN Phone Number: 04/29/2023, 1:30 PM   Clinical Narrative:    Spoke with patient at his bedside. Patient  stated he lived alone prior to his admission. He was able to function independently. He states he gets food stamps and his sister takes him where he needs to go.   Spoke with patient's sister, [name]. She left a message stating  she was unaware patient could not walk. She was advised patient is able to ambulate and would be discharged today. [name] was advised HH was arranged via [facility] and they would call to scheduled an appointment for [facility]. She will transport him home and to notify her when  patient is ready.   Nurse advised to call patient's sister when he is ready for discharge.   3:09pm Per Barbara Cower from [facility] patient can be seen for [facility] 9/9. Left a message for patient's sister, [name] to advise of SOC.    Final next level of care: Home w Home Health Services Barriers to Discharge: Barriers Resolved   Patient Goals and CMS Choice      Discharge Placement                      Patient and family notified of of transfer: 04/28/23  Discharge Plan and Services Additional resources added to the After Visit Summary for                            [facility] Arranged: PT, OT HH Agency: [facility] ([facility] Date HH Agency Contacted: 04/28/23 Time HH Agency Contacted: 1624 Representative spoke with at [facility] Agency: Barbara Cower  Social Determinants of Health (SDOH) Interventions SDOH Screenings   Food Insecurity: No Food Insecurity (04/22/2023)  Housing: Low Risk  (04/22/2023)  Transportation Needs: No Transportation Needs (04/22/2023)  Utilities: Not At Risk (04/22/2023)  Alcohol Screen: Low Risk  (06/15/2022)  Depression (PHQ2-9): Low Risk  (03/03/2023)  Financial Resource  Strain: Low Risk  (11/28/2020)  Physical Activity: Inactive (06/15/2022)  Social Connections: Socially Isolated (06/15/2022)  Stress: Stress Concern Present (06/15/2022)  Tobacco Use: Medium Risk (04/27/2023)   Received from [facility]     Readmission Risk Interventions     No data to display

## 2023-04-29 NOTE — Plan of Care (Signed)
  Problem: Pain Managment: Goal: General experience of comfort will improve Outcome: Progressing   Problem: Safety: Goal: Ability to remain free from injury will improve Outcome: Progressing   Problem: Skin Integrity: Goal: Risk for impaired skin integrity will decrease Outcome: Progressing   Problem: Education: Goal: Knowledge of [place] [facility] Education information/materials will improve Outcome: Progressing

## 2023-04-30 DIAGNOSIS — D62 Acute posthemorrhagic anemia: Secondary | ICD-10-CM | POA: Insufficient documentation

## 2023-05-02 NOTE — Group Note (Deleted)

## 2023-05-04 ENCOUNTER — Inpatient Hospital Stay: Payer: 59

## 2023-05-04 ENCOUNTER — Telehealth: Payer: Self-pay

## 2023-05-04 DIAGNOSIS — K449 Diaphragmatic hernia without obstruction or gangrene: Secondary | ICD-10-CM | POA: Diagnosis not present

## 2023-05-04 DIAGNOSIS — E876 Hypokalemia: Secondary | ICD-10-CM | POA: Diagnosis not present

## 2023-05-04 DIAGNOSIS — D509 Iron deficiency anemia, unspecified: Secondary | ICD-10-CM | POA: Diagnosis not present

## 2023-05-04 DIAGNOSIS — K640 First degree hemorrhoids: Secondary | ICD-10-CM | POA: Diagnosis not present

## 2023-05-04 DIAGNOSIS — C3491 Malignant neoplasm of unspecified part of right bronchus or lung: Secondary | ICD-10-CM | POA: Diagnosis not present

## 2023-05-04 DIAGNOSIS — C3492 Malignant neoplasm of unspecified part of left bronchus or lung: Secondary | ICD-10-CM | POA: Diagnosis not present

## 2023-05-04 DIAGNOSIS — D122 Benign neoplasm of ascending colon: Secondary | ICD-10-CM | POA: Diagnosis not present

## 2023-05-04 DIAGNOSIS — J449 Chronic obstructive pulmonary disease, unspecified: Secondary | ICD-10-CM | POA: Diagnosis not present

## 2023-05-04 DIAGNOSIS — D630 Anemia in neoplastic disease: Secondary | ICD-10-CM | POA: Diagnosis not present

## 2023-05-04 DIAGNOSIS — I77819 Aortic ectasia, unspecified site: Secondary | ICD-10-CM | POA: Diagnosis not present

## 2023-05-04 DIAGNOSIS — K5730 Diverticulosis of large intestine without perforation or abscess without bleeding: Secondary | ICD-10-CM | POA: Diagnosis not present

## 2023-05-04 DIAGNOSIS — I700 Atherosclerosis of aorta: Secondary | ICD-10-CM | POA: Diagnosis not present

## 2023-05-04 DIAGNOSIS — I7121 Aneurysm of the ascending aorta, without rupture: Secondary | ICD-10-CM | POA: Diagnosis not present

## 2023-05-04 DIAGNOSIS — I10 Essential (primary) hypertension: Secondary | ICD-10-CM | POA: Diagnosis not present

## 2023-05-04 DIAGNOSIS — D125 Benign neoplasm of sigmoid colon: Secondary | ICD-10-CM | POA: Diagnosis not present

## 2023-05-04 DIAGNOSIS — D62 Acute posthemorrhagic anemia: Secondary | ICD-10-CM | POA: Diagnosis not present

## 2023-05-04 DIAGNOSIS — E7800 Pure hypercholesterolemia, unspecified: Secondary | ICD-10-CM | POA: Diagnosis not present

## 2023-05-04 DIAGNOSIS — D120 Benign neoplasm of cecum: Secondary | ICD-10-CM | POA: Diagnosis not present

## 2023-05-04 DIAGNOSIS — K209 Esophagitis, unspecified without bleeding: Secondary | ICD-10-CM | POA: Diagnosis not present

## 2023-05-04 DIAGNOSIS — K2970 Gastritis, unspecified, without bleeding: Secondary | ICD-10-CM | POA: Diagnosis not present

## 2023-05-04 DIAGNOSIS — D124 Benign neoplasm of descending colon: Secondary | ICD-10-CM | POA: Diagnosis not present

## 2023-05-04 DIAGNOSIS — K219 Gastro-esophageal reflux disease without esophagitis: Secondary | ICD-10-CM | POA: Diagnosis not present

## 2023-05-04 DIAGNOSIS — H9190 Unspecified hearing loss, unspecified ear: Secondary | ICD-10-CM | POA: Diagnosis not present

## 2023-05-04 NOTE — Telephone Encounter (Signed)
Gave verbal order for [facility] [phone] for physical therapy for once a week for 5 weeks

## 2023-05-05 DIAGNOSIS — K2950 Unspecified chronic gastritis without bleeding: Secondary | ICD-10-CM | POA: Diagnosis not present

## 2023-05-05 DIAGNOSIS — D509 Iron deficiency anemia, unspecified: Secondary | ICD-10-CM | POA: Diagnosis not present

## 2023-05-06 ENCOUNTER — Inpatient Hospital Stay: Payer: 59

## 2023-05-10 ENCOUNTER — Telehealth: Payer: Self-pay

## 2023-05-10 DIAGNOSIS — E876 Hypokalemia: Secondary | ICD-10-CM | POA: Diagnosis not present

## 2023-05-10 DIAGNOSIS — K640 First degree hemorrhoids: Secondary | ICD-10-CM | POA: Diagnosis not present

## 2023-05-10 DIAGNOSIS — E7800 Pure hypercholesterolemia, unspecified: Secondary | ICD-10-CM | POA: Diagnosis not present

## 2023-05-10 DIAGNOSIS — D125 Benign neoplasm of sigmoid colon: Secondary | ICD-10-CM | POA: Diagnosis not present

## 2023-05-10 DIAGNOSIS — D122 Benign neoplasm of ascending colon: Secondary | ICD-10-CM | POA: Diagnosis not present

## 2023-05-10 DIAGNOSIS — C3491 Malignant neoplasm of unspecified part of right bronchus or lung: Secondary | ICD-10-CM | POA: Diagnosis not present

## 2023-05-10 DIAGNOSIS — H9190 Unspecified hearing loss, unspecified ear: Secondary | ICD-10-CM | POA: Diagnosis not present

## 2023-05-10 DIAGNOSIS — D62 Acute posthemorrhagic anemia: Secondary | ICD-10-CM | POA: Diagnosis not present

## 2023-05-10 DIAGNOSIS — D509 Iron deficiency anemia, unspecified: Secondary | ICD-10-CM | POA: Diagnosis not present

## 2023-05-10 DIAGNOSIS — D120 Benign neoplasm of cecum: Secondary | ICD-10-CM | POA: Diagnosis not present

## 2023-05-10 DIAGNOSIS — K449 Diaphragmatic hernia without obstruction or gangrene: Secondary | ICD-10-CM | POA: Diagnosis not present

## 2023-05-10 DIAGNOSIS — K5730 Diverticulosis of large intestine without perforation or abscess without bleeding: Secondary | ICD-10-CM | POA: Diagnosis not present

## 2023-05-10 DIAGNOSIS — I700 Atherosclerosis of aorta: Secondary | ICD-10-CM | POA: Diagnosis not present

## 2023-05-10 DIAGNOSIS — K2970 Gastritis, unspecified, without bleeding: Secondary | ICD-10-CM | POA: Diagnosis not present

## 2023-05-10 DIAGNOSIS — J449 Chronic obstructive pulmonary disease, unspecified: Secondary | ICD-10-CM | POA: Diagnosis not present

## 2023-05-10 DIAGNOSIS — K219 Gastro-esophageal reflux disease without esophagitis: Secondary | ICD-10-CM | POA: Diagnosis not present

## 2023-05-10 DIAGNOSIS — D124 Benign neoplasm of descending colon: Secondary | ICD-10-CM | POA: Diagnosis not present

## 2023-05-10 DIAGNOSIS — C3492 Malignant neoplasm of unspecified part of left bronchus or lung: Secondary | ICD-10-CM | POA: Diagnosis not present

## 2023-05-10 DIAGNOSIS — D630 Anemia in neoplastic disease: Secondary | ICD-10-CM | POA: Diagnosis not present

## 2023-05-10 DIAGNOSIS — I77819 Aortic ectasia, unspecified site: Secondary | ICD-10-CM | POA: Diagnosis not present

## 2023-05-10 DIAGNOSIS — K209 Esophagitis, unspecified without bleeding: Secondary | ICD-10-CM | POA: Diagnosis not present

## 2023-05-10 DIAGNOSIS — I10 Essential (primary) hypertension: Secondary | ICD-10-CM | POA: Diagnosis not present

## 2023-05-10 DIAGNOSIS — I7121 Aneurysm of the ascending aorta, without rupture: Secondary | ICD-10-CM | POA: Diagnosis not present

## 2023-05-10 NOTE — Telephone Encounter (Signed)
[facility] physical therapist called [phone] that he took BP sitting 94/54 and standing 89/47 as per alyssa FNP advised that he can take half tab of enalapril 20 mg and keep Bp reading and discuss at next visit 05/18/23

## 2023-05-16 ENCOUNTER — Inpatient Hospital Stay: Payer: 59 | Admitting: Nurse Practitioner

## 2023-05-16 ENCOUNTER — Ambulatory Visit
Admission: RE | Admit: 2023-05-16 | Discharge: 2023-05-16 | Disposition: A | Discharge status: Home / Self Care | Payer: 59 | Source: Ambulatory Visit | Attending: Radiation Oncology | Admitting: Radiation Oncology

## 2023-05-16 ENCOUNTER — Encounter: Payer: Self-pay | Admitting: Internal Medicine

## 2023-05-16 DIAGNOSIS — C3490 Malignant neoplasm of unspecified part of unspecified bronchus or lung: Secondary | ICD-10-CM | POA: Diagnosis not present

## 2023-05-16 DIAGNOSIS — J439 Emphysema, unspecified: Secondary | ICD-10-CM | POA: Diagnosis not present

## 2023-05-16 DIAGNOSIS — C3491 Malignant neoplasm of unspecified part of right bronchus or lung: Secondary | ICD-10-CM | POA: Diagnosis not present

## 2023-05-16 MED ORDER — IOHEXOL 300 MG/ML  SOLN
75.0000 mL | Freq: Once | INTRAMUSCULAR | Status: AC | PRN
  Administered 2023-05-16: 75 mL via INTRAVENOUS

## 2023-05-17 ENCOUNTER — Encounter: Payer: Self-pay | Admitting: Internal Medicine

## 2023-05-17 ENCOUNTER — Inpatient Hospital Stay: Payer: 59 | Attending: Internal Medicine

## 2023-05-17 ENCOUNTER — Inpatient Hospital Stay (HOSPITAL_BASED_OUTPATIENT_CLINIC_OR_DEPARTMENT_OTHER): Payer: 59 | Admitting: Internal Medicine

## 2023-05-17 ENCOUNTER — Inpatient Hospital Stay: Payer: 59

## 2023-05-17 VITALS — BP 103/67 | HR 83 | Temp 97.7°F | Wt 185.2 lb

## 2023-05-17 DIAGNOSIS — Z5112 Encounter for antineoplastic immunotherapy: Secondary | ICD-10-CM

## 2023-05-17 DIAGNOSIS — Z79899 Other long term (current) drug therapy: Secondary | ICD-10-CM | POA: Insufficient documentation

## 2023-05-17 DIAGNOSIS — Z7982 Long term (current) use of aspirin: Secondary | ICD-10-CM | POA: Insufficient documentation

## 2023-05-17 DIAGNOSIS — Z7902 Long term (current) use of antithrombotics/antiplatelets: Secondary | ICD-10-CM | POA: Diagnosis not present

## 2023-05-17 DIAGNOSIS — C3491 Malignant neoplasm of unspecified part of right bronchus or lung: Secondary | ICD-10-CM

## 2023-05-17 DIAGNOSIS — I10 Essential (primary) hypertension: Secondary | ICD-10-CM | POA: Insufficient documentation

## 2023-05-17 DIAGNOSIS — D509 Iron deficiency anemia, unspecified: Secondary | ICD-10-CM

## 2023-05-17 DIAGNOSIS — C3490 Malignant neoplasm of unspecified part of unspecified bronchus or lung: Secondary | ICD-10-CM

## 2023-05-17 DIAGNOSIS — Z87891 Personal history of nicotine dependence: Secondary | ICD-10-CM | POA: Insufficient documentation

## 2023-05-17 DIAGNOSIS — E7800 Pure hypercholesterolemia, unspecified: Secondary | ICD-10-CM | POA: Insufficient documentation

## 2023-05-17 DIAGNOSIS — Z923 Personal history of irradiation: Secondary | ICD-10-CM | POA: Insufficient documentation

## 2023-05-17 DIAGNOSIS — C3411 Malignant neoplasm of upper lobe, right bronchus or lung: Secondary | ICD-10-CM | POA: Diagnosis not present

## 2023-05-17 LAB — CBC WITH DIFFERENTIAL/PLATELET
Abs Immature Granulocytes: 0.02 10*3/uL (ref 0.00–0.07)
Basophils Absolute: 0 10*3/uL (ref 0.0–0.1)
Basophils Relative: 1 %
Eosinophils Absolute: 0.1 10*3/uL (ref 0.0–0.5)
Eosinophils Relative: 2 %
HCT: 25.7 % — ABNORMAL LOW (ref 39.0–52.0)
Hemoglobin: 8 g/dL — ABNORMAL LOW (ref 13.0–17.0)
Immature Granulocytes: 1 %
Lymphocytes Relative: 19 %
Lymphs Abs: 0.7 10*3/uL (ref 0.7–4.0)
MCH: 27.4 pg (ref 26.0–34.0)
MCHC: 31.1 g/dL (ref 30.0–36.0)
MCV: 88 fL (ref 80.0–100.0)
Monocytes Absolute: 0.5 10*3/uL (ref 0.1–1.0)
Monocytes Relative: 13 %
Neutro Abs: 2.4 10*3/uL (ref 1.7–7.7)
Neutrophils Relative %: 64 %
Platelets: 649 10*3/uL — ABNORMAL HIGH (ref 150–400)
RBC: 2.92 MIL/uL — ABNORMAL LOW (ref 4.22–5.81)
RDW: 15.8 % — ABNORMAL HIGH (ref 11.5–15.5)
WBC: 3.7 10*3/uL — ABNORMAL LOW (ref 4.0–10.5)
nRBC: 0 % (ref 0.0–0.2)

## 2023-05-17 LAB — COMPREHENSIVE METABOLIC PANEL
ALT: 13 U/L (ref 0–44)
AST: 14 U/L — ABNORMAL LOW (ref 15–41)
Albumin: 3.3 g/dL — ABNORMAL LOW (ref 3.5–5.0)
Alkaline Phosphatase: 83 U/L (ref 38–126)
Anion gap: 6 (ref 5–15)
BUN: 7 mg/dL — ABNORMAL LOW (ref 8–23)
CO2: 25 mmol/L (ref 22–32)
Calcium: 8.5 mg/dL — ABNORMAL LOW (ref 8.9–10.3)
Chloride: 106 mmol/L (ref 98–111)
Creatinine, Ser: 0.62 mg/dL (ref 0.61–1.24)
GFR, Estimated: >60 mL/min (ref >60)
Glucose, Bld: 101 mg/dL — ABNORMAL HIGH (ref 70–99)
Potassium: 3.8 mmol/L (ref 3.5–5.1)
Sodium: 137 mmol/L (ref 135–145)
Total Bilirubin: 0.4 mg/dL (ref 0.3–1.2)
Total Protein: 6.8 g/dL (ref 6.5–8.1)

## 2023-05-17 LAB — SAMPLE TO BLOOD BANK

## 2023-05-17 MED ORDER — SODIUM CHLORIDE 0.9 % IV SOLN
Freq: Once | INTRAVENOUS | Status: DC
  Filled 2023-05-17: qty 250

## 2023-05-17 MED ORDER — SODIUM CHLORIDE 0.9 % IV SOLN
1500.0000 mg | Freq: Once | INTRAVENOUS | Status: AC
  Administered 2023-05-17: 1500 mg via INTRAVENOUS
  Filled 2023-05-17: qty 30

## 2023-05-17 MED ORDER — SODIUM CHLORIDE 0.9 % IV SOLN
Freq: Once | INTRAVENOUS | Status: AC
  Filled 2023-05-17: qty 250

## 2023-05-17 MED ORDER — SODIUM CHLORIDE 0.9 % IV SOLN
200.0000 mg | Freq: Once | INTRAVENOUS | Status: AC
  Administered 2023-05-17: 200 mg via INTRAVENOUS
  Filled 2023-05-17: qty 200

## 2023-05-17 NOTE — Progress Notes (Addendum)
[facility] CONSULT NOTE  Patient Care Team: [name], [name] as PCP - General (Physician Assistant) [name], [facility] (Inactive) as Pharmacist (Pharmacist) [name], RN as Oncology Nurse Navigator Michaelyn Barter, MD as Consulting Physician (Oncology) Yevonne Pax, MD as Consulting Physician (Internal Medicine)   CANCER STAGING   Cancer Staging  Squamous cell lung cancer [facility]) Staging form: Lung, AJCC 7th Edition - Clinical: Stage IIIA (T4, N0, M0) - Signed by Michaelyn Barter, MD on 06/03/2022  CURRENT TREATMENT- Weekly Carboplatin and Taxol with RT completed on 07/27/2022.  Maintenance Durvalumab started 08/13/2022. Plan total 12 cycles.   ASSESSMENT & PLAN:  Derek Webb 72 y.o. 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  #RUL of lung SCCa, atleast Stage IIIA (cT4N0M0) #Encounter for antineoplastic immunotherapy -s/p bronchoscopy with EBUS by Dr. Jayme Cloud on 05/12/2022.  -Completed 5 cycles of CarboTaxol and RT on 07/27/2022. Cycle 6 was canceled due to persistent neutropenia.  Repeat CT chest done on 1/15 showed near complete resolution of right upper lobe mass, persistent.  Obstructive atelectasis seen.  No adenopathy.  Patient had excellent response to the treatment.    -Now on Durvalumab maintenance.  Labs reviewed and acceptable for treatment.  Will proceed with cycle 10 of Durvalumab 1500 mg every 4 weeks.  CT chest with contrast done yesterday.  Read is pending.  # Iron deficiency anemia - Admitted from 8/29 to 04/29/2023-colonoscopy with Dr. Timothy Lasso showed multiple polyps, small dark clot appearing in one of the ascending diverticuli which may have been the source of blood loss.  Upper endoscopy showed gastritis.  Pathology showed tubular adenoma.   VCE erosions in stomach that were seen on EGD.  Capsule did not reach colon.  -Continue with oral iron.  Will proceed with IV Venofer 200 mg today.  Her sister is unable to bring him every week for iron infusion.  Will do every other week for total 4 doses.  # Incidental hypermetabolism in prostate -Denies any urinary symptoms.  PSA level is normal at 3.14. -Follows with Dr. [name]  # Access - port placed on 05/28/2022   Orders Placed This Encounter  Procedures   Thyroid Panel With TSH    Standing Status:   Future    Number of Occurrences:   1    Standing Expiration Date:   05/16/2024   CBC with Differential    Standing Status:   Future    Standing Expiration Date:   06/13/2024   Comprehensive metabolic panel    Standing Status:   Future    Standing Expiration Date:   06/13/2024   CBC with Differential    Standing Status:   Future    Standing Expiration Date:   07/11/2024   Comprehensive metabolic panel    Standing Status:   Future    Standing Expiration Date:   07/11/2024   T4    Standing Status:   Future    Standing Expiration Date:   07/11/2024   TSH    Standing Status:   Future    Standing Expiration Date:   07/11/2024   RTC in 1 month for MD visit, labs, cycle 11 of Durvalumab  The total time spent in the appointment was 30 minutes encounter with patients including review of chart and various tests results, discussions about plan  of care and coordination of care plan   All questions were answered. The patient knows to call the clinic with any problems, questions or concerns. No barriers to learning was detected.  Michaelyn Barter, MD 9/24/202412:51 PM   HISTORY OF PRESENTING ILLNESS:  Derek Webb 72 y.o. 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  Patient  was seen today accompanied by [name].  Denies any cough, hemoptysis, shortness of breath and weight loss. Remote smoker.  Quit in 2013. Has family history of lung cancer in father.  INTERVAL HISTORY-  Patient seen today prior to cycle 10 of Durvalumab.  Accompanied with sister. Recently discharged from the hospital for GI.  Doing well denies any concerns today.  I have reviewed his chart and materials related to his cancer extensively and collaborated history with the patient. Summary of oncologic history is as follows: Oncology History Overview Note  History of left upper lobe lung SCC status post microwave thermal ablation in 2013 by Dr. Fredia Sorrow.  He was deemed not a surgical candidate and had transportation issues for radiation treatment.   Squamous cell lung cancer (HCC)  01/12/2022 Initial Diagnosis   Patient has been following with Dr. Welton Flakes of pulmonary for suspicious lung nodule. CT chest in March 2021 showed post ablation scarring in LUL not changed and increased atelectasis of RML and RUL. He did not follow through.   CT chest in 01/12/2022 showed  New abrupt and slightly irregular cut off of the right upper lobe bronchus with associated right upper lobe atelectasis and postobstructive changes. Findings are concerning for endobronchial malignancy    04/08/2022 PET scan   IMPRESSION: 1. Hypermetabolic central right upper lobe lung mass nearly occluding the right upper lobe bronchus, poorly delineated on the noncontrast CT images, measuring approximately 3.8 x 2.9 cm, compatible with primary bronchogenic malignancy. 2. Postobstructive pneumonia/atelectasis throughout the basilar right upper lobe. 3. No hypermetabolic metastatic thoracic adenopathy or distant metastatic disease. 4. Stable mild post ablation change in the left upper lobe with no metabolic evidence of local tumor recurrence in this location. 5. Dilated 4.4 cm ascending thoracic aorta. Recommend annual  imaging followup by CTA or MRA   05/12/2022 Procedure   Procedure was delayed due to miscommunication about holding Plavix. S/p bronchoscopy with EBUS by Dr. Jayme Cloud. Op note mentions significant bulky adenopathy in the subcarinal space and in the right hilum.    05/12/2022 Pathology Results   DIAGNOSIS:  A.  LUNG, RIGHT UPPER LOBE; ENB-ASSISTED BIOPSY:  - SQUAMOUS CELL CARCINOMA.   LUNG, RIGHT UPPER LOBE; EBUS-ASSISTED BRUSHING:  - POSITIVE FOR MALIGNANCY.  - SQUAMOUS CELL CARCINOMA IS PRESENT.   LUNG, RIGHT UPPER LOBE; EBUS-ASSISTED LAVAGE:  - POSITIVE FOR MALIGNANCY.  - SQUAMOUS CELL CARCINOMA IS PRESENT  SUBCARINAL SPACE, RIGHT; EBUS-ASSISTED FNA:  - POSITIVE FOR MALIGNANCY.  - SQUAMOUS CELL CARCINOMA IS PRESENT   05/18/2022 Cancer Staging   Staging form: Lung, AJCC 7th Edition - Clinical: Stage IIIA (T4, N0, M0) - Signed by Michaelyn Barter, MD on 06/03/2022   05/21/2022 Imaging   MRI brain  No evidence of metastatic disease in the brain. 2. Small enhancing foci in the right parietal and right occipital calvarium, which are indeterminate but could represent small osseous metastases. Attention on follow-up   06/03/2022 - 07/27/2022 Chemotherapy  Patient is on Treatment Plan : LUNG Carboplatin + Paclitaxel + XRT q7d      08/03/2022 -  Chemotherapy   Patient is on Treatment Plan : LUNG NSCLC Durvalumab (1500) q28d       Imaging   - CT chest with contrast (11/30/2022) was reviewed at the tumor conference.  There is interval development of marked interstitial disease and patchy airspace disease more confluent consolidative opacity in the peripheral right upper lobe progressive and interval.  In the lower lung there is ill-defined tree-in-bud nodularity with nodular areas measuring up to 1.4 cm in the posterior left lower lobe.  Features are suggestive of infectious and inflammatory etiology however disease progression cannot be excluded.  Liver lesion is stable and likely  hemangioma.  Patient continues to be asymptomatic.  Cycle 6 of Durvalumab was held.    - Repeat CT chest with contrast (01/19/2023) showed interval improvement in the diffuse interstitial lung disease seen on prior study.  But there is still some persistent interstitial patchy area of consolidative airspace in the left upper lobe.  Ill-defined nodularity in the lower lobe has largely resolved although some component of dominant nodularity persist.  There is continued progression of volume loss in the right hemithorax with consolidative opacity in the right posterior apex and suprahilar right lung.  Right upper lobe airway is no longer patent.  Could be posttreatment related versus recurrent disease.   -PET/CT (02/08/2023) showed radiation changes in the right upper and medial left upper lobe.  No suspicious findings for residual or recurrent lung cancer.  Focal left prostate hypermetabolic some raising possibility of prostate cancer.  - MRI brain with no evidence of intracranial metastasis.  Unchanged subcentimeter enhancing focus in the right parietal calvarium nonspecific.     MEDICAL HISTORY:  Past Medical History:  Diagnosis Date   Alcoholism (HCC)    COPD (chronic obstructive pulmonary disease) (HCC)    SEVERE COPD -OCCASIONALLY USES OXYGEN AT NIGHT-DOES NOT USE INHALERS ON REGULAR BASIS   Elevated cholesterol    GERD (gastroesophageal reflux disease)    HOH (hard of hearing)    Hypertension    Lung cancer (HCC)    LEFT UPPER LUNG CARCINOMA-NOT A CANDIDATE FOR SURGICAL RESECTION BECAUSE OF HIS COPD AND POOR CANDIDATE FOR RADIATION BECAUSE OF TRANSPORTATION PROBLEMS   Seizures (HCC)    CHRONIC DILATIN - PT STATES NO SEIZURES IN PAST COUPLE OF YEARS; PT STATES HIS SEIZURES CAUSED NUMBNESS OF ARMS AND HANDS AND NOT ABLE TO MOVE HIS ARMS   Stroke (HCC)    2 TO 3 YRS AGO-AFFECTED HIS SPEECH--ABLE TO AMBULATE WITHOUT ASSIST AND DOES YARD WORK.  DECREASED HEARING IN BOTH EARS SINCE STROKE.    Vitamin D deficiency     SURGICAL HISTORY: Past Surgical History:  Procedure Laterality Date   CAROTID SURGERY 2009 -LEFT     COLON POLYP EXCISION     COLON SURGERY     COLONOSCOPY WITH PROPOFOL N/A 04/18/2017   Procedure: COLONOSCOPY WITH PROPOFOL;  Surgeon: Scot Jun, MD;  Location: [facility] ENDOSCOPY;  Service: Endoscopy;  Laterality: N/A;   COLONOSCOPY WITH PROPOFOL N/A 04/26/2023   Procedure: COLONOSCOPY WITH PROPOFOL;  Surgeon: Jaynie Collins, DO;  Location: [facility] ENDOSCOPY;  Service: Gastroenterology;  Laterality: N/A;   ESOPHAGOGASTRODUODENOSCOPY     ESOPHAGOGASTRODUODENOSCOPY (EGD) WITH PROPOFOL N/A 04/23/2023   Procedure: ESOPHAGOGASTRODUODENOSCOPY (EGD) WITH PROPOFOL;  Surgeon: Jaynie Collins, DO;  Location: [facility] ENDOSCOPY;  Service: Gastroenterology;  Laterality: N/A;   GIVENS CAPSULE STUDY  04/26/2023   Procedure: GIVENS CAPSULE STUDY;  Surgeon: Jaynie Collins, DO;  Location: [facility] ENDOSCOPY;  Service: Gastroenterology;;   IR GENERIC HISTORICAL  01/21/2016   IR RADIOLOGIST EVAL & MGMT 01/21/2016 Irish Lack, MD [facility] INTERV RAD   IR GENERIC HISTORICAL  10/22/2014   IR RADIOLOGIST EVAL & MGMT 10/22/2014 Irish Lack, MD [facility] INTERV RAD   IR IMAGING GUIDED PORT INSERTION  05/28/2022   IR RADIOLOGIST EVAL & MGMT  02/02/2017   IR RADIOLOGIST EVAL & MGMT  03/08/2018   percutaneous biopsy     POLYPECTOMY  04/26/2023   Procedure: POLYPECTOMY;  Surgeon: Jaynie Collins, DO;  Location: [facility] ENDOSCOPY;  Service: Gastroenterology;;   VIDEO BRONCHOSCOPY WITH ENDOBRONCHIAL ULTRASOUND Right 05/12/2022   Procedure: VIDEO BRONCHOSCOPY WITH ENDOBRONCHIAL ULTRASOUND;  Surgeon: Salena Saner, MD;  Location: [facility] ORS;  Service: Cardiopulmonary;  Laterality: Right;    SOCIAL HISTORY: Social History   Socioeconomic History   Marital status: Single    Spouse name: Not on file   Number of children: Not on file   Years of education: Not on file   Highest education  level: Not on file  Occupational History   Not on file  Tobacco Use   Smoking status: Former    Current packs/day: 0.00    Average packs/day: 1 pack/day for 35.0 years (35.0 ttl pk-yrs)    Types: Cigarettes    Start date: 12/21/1976    Quit date: 12/22/2011    Years since quitting: 11.4   Smokeless tobacco: Never  Vaping Use   Vaping status: Never Used  Substance and Sexual Activity   Alcohol use: Yes    Comment: occosionally    Drug use: No   Sexual activity: Not on file  Other Topics Concern   Not on file  Social History Narrative   Not on file   Social Determinants of Health   Financial Resource Strain: Low Risk  (11/28/2020)   Overall Financial Resource Strain (CARDIA)    Difficulty of Paying Living Expenses: Not hard at all  Food Insecurity: No Food Insecurity (04/22/2023)   Hunger Vital Sign    Worried About Running Out of Food in the Last Year: Never true    Ran Out of Food in the Last Year: Never true  Transportation Needs: No Transportation Needs (04/22/2023)   PRAPARE - Administrator, Civil Service (Medical): No    Lack of Transportation (Non-Medical): No  Physical Activity: Inactive (06/15/2022)   Exercise Vital Sign    Days of Exercise per Week: 0 days    Minutes of Exercise per Session: 0 min  Stress: Stress Concern Present (06/15/2022)   [employer] of Occupational Health - Occupational Stress Questionnaire    Feeling of Stress : To some extent  Social Connections: Socially Isolated (06/15/2022)   Social Connection and Isolation Panel [NHANES]    Frequency of Communication with Friends and Family: More than three times a week    Frequency of Social Gatherings with Friends and Family: More than three times a week    Attends Religious Services: Never    Database administrator or Organizations: No    Attends Banker Meetings: Never    Marital Status: Never married  Intimate Partner Violence: Not At Risk (04/22/2023)   Humiliation,  Afraid, Rape, and Kick questionnaire    Fear of Current or Ex-Partner: No    Emotionally Abused: No    Physically Abused: No    Sexually Abused:  No    FAMILY HISTORY: Family History  Problem Relation Age of Onset   Colon polyps Sister     ALLERGIES:  has No Known Allergies.  MEDICATIONS:  Current Outpatient Medications  Medication Sig Dispense Refill   ascorbic acid (VITAMIN C) 500 MG tablet Take 1 tablet (500 mg total) by mouth daily. 30 tablet 1   aspirin 81 MG tablet Take 81 mg by mouth daily.     atorvastatin (LIPITOR) 80 MG tablet Take 1 tablet (80 mg total) by mouth daily. 90 tablet 3   budesonide-formoterol (SYMBICORT) 160-4.5 MCG/ACT inhaler Inhale 1 puff into the lungs 2 (two) times daily. 1 each 5   Cholecalciferol (VITAMIN D) 2000 UNITS CAPS Take by mouth. VITAMIN D 3 2000 IU  ONE SOFTGEL DAILY  --OTC     clopidogrel (PLAVIX) 75 MG tablet Take 1 tablet (75 mg total) by mouth daily. 90 tablet 3   cyanocobalamin 1000 MCG tablet Take 1 tablet (1,000 mcg total) by mouth daily. 30 tablet 1   enalapril (VASOTEC) 20 MG tablet Take 1 tablet (20 mg total) by mouth daily. 90 tablet 3   ezetimibe (ZETIA) 10 MG tablet Take 1 tablet (10 mg total) by mouth daily. 90 tablet 3   iron polysaccharides (NIFEREX) 150 MG capsule Take 1 capsule (150 mg total) by mouth daily. 30 capsule 1   pantoprazole (PROTONIX) 40 MG tablet Take 1 tablet (40 mg total) by mouth 2 (two) times daily. IN AM 90 tablet 2   phenytoin (DILANTIN) 100 MG ER capsule Take 4 capsules (400 mg total) by mouth at bedtime. 360 capsule 0   No current facility-administered medications for this visit.   Facility-Administered Medications Ordered in Other Visits  Medication Dose Route Frequency Provider Last Rate Last Admin   0.9 %  sodium chloride infusion   Intravenous Once Michaelyn Barter, MD        REVIEW OF SYSTEMS:   Pertinent information mentioned in HPI All other systems were reviewed with the patient and are  negative.  PHYSICAL EXAMINATION: ECOG PERFORMANCE STATUS: 2 - Symptomatic, <50% confined to bed  Vitals:   05/17/23 0927  BP: 103/67  Pulse: 83  Temp: 97.7 F (36.5 C)      Filed Weights   05/17/23 0927  Weight: 185 lb 3.2 oz (84 kg)        GENERAL:alert, no distress and comfortable SKIN: skin color, texture, turgor are normal, no rashes or significant lesions EYES: normal, conjunctiva are pink and non-injected, sclera clear OROPHARYNX:no exudate, no erythema and lips, buccal mucosa, and tongue normal  NECK: supple, thyroid normal size, non-tender, without nodularity LYMPH:  no palpable lymphadenopathy in the cervical, axillary or inguinal LUNGS: clear to auscultation and percussion with normal breathing effort HEART: regular rate & rhythm and no murmurs and no lower extremity edema ABDOMEN:abdomen soft, non-tender and normal bowel sounds Musculoskeletal:no cyanosis of digits and no clubbing  PSYCH: alert & oriented x 3 with fluent speech NEURO: no focal motor/sensory deficits  LABORATORY DATA:  I have reviewed the data as listed Lab Results  Component Value Date   WBC 3.7 (L) 05/17/2023   HGB 8.0 (L) 05/17/2023   HCT 25.7 (L) 05/17/2023   MCV 88.0 05/17/2023   PLT 649 (H) 05/17/2023   Recent Labs    04/19/23 0944 04/21/23 1626 04/21/23 2238 04/23/23 0514 04/28/23 0415 04/29/23 0635 05/17/23 0912  NA 137   < >  --    < > 139 140 137  K 3.6   < >  --    < > 3.7 3.5 3.8  CL 106   < >  --    < > 104 107 106  CO2 25   < >  --    < > 27 27 25   GLUCOSE 98   < >  --    < > 101* 94 101*  BUN 12   < >  --    < > 7* 5* 7*  CREATININE 0.52*   < >  --    < > 0.60* 0.57* 0.62  CALCIUM 8.3*   < >  --    < > 8.3* 8.3* 8.5*  GFRNONAA >60   < >  --    < > >60 >60 >60  PROT 6.6  --  6.5  --   --   --  6.8  ALBUMIN 3.2*  --  3.2*  --   --   --  3.3*  AST 15  --  13*  --   --   --  14*  ALT 12  --  12  --   --   --  13  ALKPHOS 89  --  93  --   --   --  83  BILITOT  0.3  --  0.6  --   --   --  0.4  BILIDIR  --   --  <0.1  --   --   --   --   IBILI  --   --  NOT CALCULATED  --   --   --   --    < > = values in this interval not displayed.    RADIOGRAPHIC STUDIES: I have personally reviewed the radiological images as listed and agreed with the findings in the report. CT HEAD WO CONTRAST ( )  Result Date: 04/24/2023 CLINICAL DATA:  Initial evaluation for mental status change, unknown cause. EXAM: CT HEAD WITHOUT CONTRAST TECHNIQUE: Contiguous axial images were obtained from the base of the skull through the vertex without intravenous contrast. RADIATION DOSE REDUCTION: This exam was performed according to the departmental dose-optimization program which includes automated exposure control, adjustment of the mA and/or kV according to patient size and/or use of iterative reconstruction technique. COMPARISON:  Prior MRI from 02/07/2023. FINDINGS: Brain: Generalized age-related cerebral atrophy. Multiple chronic infarcts involving the left frontal lobe and bilateral parietal lobes, stable. Probable few small remote lacunar infarcts about the bilateral basal ganglia, also unchanged. No acute intracranial hemorrhage. No acute large vessel territory infarct. No mass lesion, midline shift or mass effect. No hydrocephalus or extra-axial fluid collection. Vascular: No abnormal hyperdense vessel. Scattered calcified atherosclerosis present at the skull base. Skull: Scalp soft tissues demonstrate no acute finding. Calvarium intact. Sinuses/Orbits: Globes orbital soft tissues within normal limits. Paranasal sinuses are clear. No significant mastoid effusion. Other: None. IMPRESSION: 1. No acute intracranial abnormality. 2. Multiple chronic infarcts as above, stable. Electronically Signed   By: Rise Mu M.D.   On: 04/24/2023 02:10   CT ANGIO GI BLEED  Result Date: 04/21/2023 CLINICAL DATA:  ABLA Concern for GI bleeding. Has received Iron infusion this week, drop in  HGB from 11 to today of 6.7. Prior non-small cell lung cancer. EXAM: CTA ABDOMEN AND PELVIS WITHOUT AND WITH CONTRAST TECHNIQUE: Multidetector CT imaging of the abdomen and pelvis was performed using the standard protocol during bolus administration of intravenous contrast. Multiplanar reconstructed images and MIPs were obtained and reviewed to  evaluate the vascular anatomy. RADIATION DOSE REDUCTION: This exam was performed according to the departmental dose-optimization program which includes automated exposure control, adjustment of the mA and/or kV according to patient size and/or use of iterative reconstruction technique. CONTRAST:  OMNIPAQUE IOHEXOL 350 MG/ML SOLN COMPARISON:  CT chest 01/19/2023. CT chest 01/26/2017, CT chest 06/27/2012, pet ct 04/08/22 FINDINGS: VASCULAR No extravasation of intravenous contrast noted within the lumen of the bowel. Aorta: Severe atherosclerotic plaque. Normal caliber aorta without aneurysm, dissection, vasculitis or significant stenosis. Celiac: Mild atherosclerotic plaque. Patent without evidence of aneurysm, dissection, vasculitis or significant stenosis. SMA: Mild atherosclerotic plaque. Patent without evidence of aneurysm, dissection, vasculitis or significant stenosis. Renals: Moderate atherosclerotic plaque. Both renal arteries are patent without evidence of aneurysm, dissection, vasculitis, fibromuscular dysplasia or significant stenosis. IMA: Mild atherosclerotic plaque. Patent without evidence of aneurysm, dissection, vasculitis or significant stenosis. Inflow: Moderate to severe atherosclerotic plaque. Patent without evidence of aneurysm, dissection, vasculitis or significant stenosis. Proximal Outflow:Marland Kitchen Moderate severe atherosclerotic plaque peer bilateral common femoral and visualized portions of the superficial and profunda femoral arteries are patent without evidence of aneurysm, dissection, vasculitis or significant stenosis. Veins: The portal, splenic,  superior mesenteric veins are patent. Review of the MIP images confirms the above findings. NON-VASCULAR Lower chest: Interval development of a partially visualized subpleural indeterminate triangular shaped left upper lobe 3 mm pulmonary nodule (6:4). Cardiac changes suggestive of anemia. Hepatobiliary: There is a chronic 2.1 cm left hepatic lobe peripherally discontinuously enhancing mass on articular view that appears to be more centrally filled-in on venous view suggestive of a hepatic hemangioma. No gallstones, gallbladder wall thickening, or pericholecystic fluid. No biliary dilatation. Pancreas: No focal lesion. Normal pancreatic contour. No surrounding inflammatory changes. No main pancreatic ductal dilatation. Spleen: Normal in size without focal abnormality. Adrenals/Urinary Tract: Chronic 2.2 cm left adrenal gland nodule with a density of 10 Hounsfield units on noncontrast imaging and 34 Hounsfield units on arterial imaging suggestive an adrenal gland adenoma. Chronic 1.5 cm right adrenal gland nodule with a density 23 Hounsfield units on noncontrast imaging and 42 Hounsfield unit on arterial imaging suggestive an adrenal adenoma. No further follow-up of adrenal gland nodules given chronicity. Bilateral kidneys enhance symmetrically. Right renal cortical scarring with possible partial nephrectomy. No nephroureterolithiasis bilaterally. No hydronephrosis. No hydroureter. The urinary bladder is unremarkable. Stomach/Bowel: Stomach is within normal limits. No evidence of bowel wall thickening or dilatation. Colonic diverticulosis appendix appears normal. Lymphatic: No lymphadenopathy. Chronic calcified porta hepatic lymph node (10:33). Reproductive: Prostate is prominent size measuring up to 4.5 cm. Other: No intraperitoneal free fluid. No intraperitoneal free gas. No organized fluid collection. Musculoskeletal: No abdominal wall hernia or abnormality. No suspicious lytic or blastic osseous lesions. No acute  displaced fracture. Multilevel degenerative changes of the spine. Similar-appearing sclerotic like appearance of the L3 through S1 levels. Grade 1 anterolisthesis of L3 on L4. IMPRESSION: VASCULAR 1. No CT evidence of a gastrointestinal hemorrhage. 2.  Aortic Atherosclerosis (ICD10-I70.0). NON-VASCULAR 1. Interval development of a partially visualized indeterminate triangular shaped left upper lobe 3 mm pulmonary nodule morphology is suggestive of an intrapulmonary lymph node. 2. Chronic 2.1 cm left hepatic lobe lesion suggestive of a hepatic hemangioma. 3. Colonic diverticulosis with no acute diverticulitis. Electronically Signed   By: Tish Frederickson M.D.   On: 04/21/2023 20:00   CT ABDOMEN PELVIS WO CONTRAST  Result Date: 04/19/2023 CLINICAL DATA:  Stage III right upper lobe squamous cell carcinoma. Staging. * Tracking Code: BO * EXAM: CT ABDOMEN AND  PELVIS WITHOUT CONTRAST TECHNIQUE: Multidetector CT imaging of the abdomen and pelvis was performed following the standard protocol without IV contrast. RADIATION DOSE REDUCTION: This exam was performed according to the departmental dose-optimization program which includes automated exposure control, adjustment of the mA and/or kV according to patient size and/or use of iterative reconstruction technique. COMPARISON:  02/08/2023 PET. No recent comparison abdominopelvic CTs. FINDINGS: Lower chest: Emphysema. Bibasilar scarring. Normal heart size without pericardial or pleural effusion. Hepatobiliary: Segment 2 hypoattenuating 1.8 cm lesion on 11/2 is similar to on the prior PET and subtly present on 04/08/2022 PET. Not hypermetabolic on those exams. Normal gallbladder, without biliary ductal dilatation. Pancreas: Normal, without mass or ductal dilatation. Spleen: Normal in size, without focal abnormality. Adrenals/Urinary Tract: Bilateral adrenal thickening is chronic, likely due to hyperplasia. No renal calculi or hydronephrosis. No hydroureter or ureteric  calculi. No bladder calculi. Stomach/Bowel: Normal stomach, without wall thickening. Scattered colonic diverticula. Normal terminal ileum. Normal small bowel. Vascular/Lymphatic: Aortic atherosclerosis. Calcified nodes in the porta hepatis likely relate to old granulomatous disease. No abdominopelvic adenopathy. Reproductive: Mild prostatomegaly. Other: No significant free fluid. No free intraperitoneal air. No evidence of omental or peritoneal disease. Musculoskeletal: Advanced lumbosacral spondylosis. Trace L3-4 anterolisthesis. IMPRESSION: 1. Mildly decreased sensitivity secondary to lack of of oral or IV contrast. 2. No evidence of abdominopelvic metastasis. 3. 1.8 cm hypoattenuating segment 2 liver lesion is present back to 04/08/2022 PET and was not hypermetabolic on 02/08/2023 PET, favoring a benign etiology. When compared back to a contrast-enhanced CT of 03/02/2018, vague enhancement in this area suggests underlying hemangioma. 4. Incidental findings, including: Aortic atherosclerosis (ICD10-I70.0) and emphysema (ICD10-J43.9). Prostatomegaly. Electronically Signed   By: Jeronimo Greaves M.D.   On: 04/19/2023 15:18

## 2023-05-17 NOTE — Patient Instructions (Signed)
[facility] [place] [facility]  Discharge Instructions: Thank you for choosing [facility] to provide your oncology and hematology care.  If you have a lab appointment with the Cancer Center, please go directly to the Cancer Center and check in at the registration area.  Wear comfortable clothing and clothing appropriate for easy access to any Portacath or PICC line.   We strive to give you quality time with your provider. You may need to reschedule your appointment if you arrive late (15 or more minutes).  Arriving late affects you and other patients whose appointments are after yours.  Also, if you miss three or more appointments without notifying the office, you may be dismissed from the clinic at the provider's discretion.      For prescription refill requests, have your pharmacy contact our office and allow 72 hours for refills to be completed.    Today you received the following chemotherapy and/or immunotherapy agents Imfinzi        To help prevent nausea and vomiting after your treatment, we encourage you to take your nausea medication as directed.  BELOW ARE SYMPTOMS THAT SHOULD BE REPORTED IMMEDIATELY: *FEVER GREATER THAN 100.4 F (38 C) OR HIGHER *CHILLS OR SWEATING *NAUSEA AND VOMITING THAT IS NOT CONTROLLED WITH YOUR NAUSEA MEDICATION *UNUSUAL SHORTNESS OF BREATH *UNUSUAL BRUISING OR BLEEDING *URINARY PROBLEMS (pain or burning when urinating, or frequent urination) *BOWEL PROBLEMS (unusual diarrhea, constipation, pain near the anus) TENDERNESS IN MOUTH AND THROAT WITH OR WITHOUT PRESENCE OF ULCERS (sore throat, sores in mouth, or a toothache) UNUSUAL RASH, SWELLING OR PAIN  UNUSUAL VAGINAL DISCHARGE OR ITCHING   Items with * indicate a potential emergency and should be followed up as soon as possible or go to the Emergency Department if any problems should occur.  Please show the CHEMOTHERAPY ALERT CARD or IMMUNOTHERAPY ALERT CARD at check-in to  the Emergency Department and triage nurse.  Should you have questions after your visit or need to cancel or reschedule your appointment, please contact [facility] [place] [facility]  [phone] and follow the prompts.  Office hours are 8:00 a.m. to 4:30 p.m. Monday - Friday. Please note that voicemails left after 4:00 p.m. may not be returned until the following business day.  We are closed weekends and major holidays. You have access to a nurse at all times for urgent questions. Please call the main number to the clinic [phone] and follow the prompts.  For any non-urgent questions, you may also contact your provider using MyChart. We now offer e-Visits for anyone 18 and older to request care online for non-urgent symptoms. For details visit [email or web address].   Also download the MyChart app! Go to the app store, search "MyChart", open the app, select [place], and log in with your MyChart username and password.    

## 2023-05-18 ENCOUNTER — Encounter: Payer: Self-pay | Admitting: Nurse Practitioner

## 2023-05-18 ENCOUNTER — Ambulatory Visit (INDEPENDENT_AMBULATORY_CARE_PROVIDER_SITE_OTHER): Payer: 59 | Admitting: Nurse Practitioner

## 2023-05-18 VITALS — BP 110/68 | HR 60 | Temp 97.6°F | Resp 16 | Ht 67.0 in | Wt 185.2 lb

## 2023-05-18 DIAGNOSIS — I10 Essential (primary) hypertension: Secondary | ICD-10-CM | POA: Diagnosis not present

## 2023-05-18 DIAGNOSIS — J4489 Other specified chronic obstructive pulmonary disease: Secondary | ICD-10-CM

## 2023-05-18 DIAGNOSIS — Z09 Encounter for follow-up examination after completed treatment for conditions other than malignant neoplasm: Secondary | ICD-10-CM

## 2023-05-18 DIAGNOSIS — D649 Anemia, unspecified: Secondary | ICD-10-CM | POA: Diagnosis not present

## 2023-05-18 DIAGNOSIS — Z8669 Personal history of other diseases of the nervous system and sense organs: Secondary | ICD-10-CM | POA: Diagnosis not present

## 2023-05-18 LAB — THYROID PANEL WITH TSH
Free Thyroxine Index: 1.2 (ref 1.2–4.9)
T3 Uptake Ratio: 23 % — ABNORMAL LOW (ref 24–39)
T4, Total: 5.2 ug/dL (ref 4.5–12.0)
TSH: 1.43 u[IU]/mL (ref 0.450–4.500)

## 2023-05-18 MED ORDER — BUDESONIDE-FORMOTEROL FUMARATE 160-4.5 MCG/ACT IN AERO
1.0000 | INHALATION_SPRAY | Freq: Two times a day (BID) | RESPIRATORY_TRACT | 5 refills | Status: DC
Start: 2023-05-18 — End: 2023-12-19

## 2023-05-18 MED ORDER — PHENYTOIN SODIUM EXTENDED 100 MG PO CAPS
400.0000 mg | ORAL_CAPSULE | Freq: Every day | ORAL | 1 refills | Status: DC
Start: 2023-05-18 — End: 2023-12-21

## 2023-05-18 MED ORDER — ENALAPRIL MALEATE 10 MG PO TABS
10.0000 mg | ORAL_TABLET | Freq: Every day | ORAL | 1 refills | Status: DC
Start: 2023-05-18 — End: 2023-11-28

## 2023-05-18 NOTE — Progress Notes (Signed)
[facility] [place], [place] [address] [place] [address] [phone]                                   Transitional Care Clinic   [facility] Discharge Acute Issues Care Follow Up                                                                        Patient Demographics  Derek Webb, is a 72 y.o. male  DOB 25-Oct-1950  MRN [id number].  Primary MD  [name], Salomon Fick, PA-C  Admit date:     04/21/2023  Discharge date: 04/29/23    Reason for TCC follow Up - IDA, GERD   Past Medical History:  Diagnosis Date   Alcoholism (HCC)    COPD (chronic obstructive pulmonary disease) (HCC)    SEVERE COPD -OCCASIONALLY USES OXYGEN AT NIGHT-DOES NOT USE INHALERS ON REGULAR BASIS   Elevated cholesterol    GERD (gastroesophageal reflux disease)    HOH (hard of hearing)    Hypertension    Lung cancer (HCC)    LEFT UPPER LUNG CARCINOMA-NOT A CANDIDATE FOR SURGICAL RESECTION BECAUSE OF HIS COPD AND POOR CANDIDATE FOR RADIATION BECAUSE OF TRANSPORTATION PROBLEMS   Seizures (HCC)    CHRONIC DILATIN - PT STATES NO SEIZURES IN PAST COUPLE OF YEARS; PT STATES HIS SEIZURES CAUSED NUMBNESS OF ARMS AND HANDS AND NOT ABLE TO MOVE HIS ARMS   Stroke (HCC)    2 TO 3 YRS AGO-AFFECTED HIS SPEECH--ABLE TO AMBULATE WITHOUT ASSIST AND DOES YARD WORK.  DECREASED HEARING IN BOTH EARS SINCE STROKE.   Vitamin D deficiency     Past Surgical History:  Procedure Laterality Date   CAROTID SURGERY 2009 -LEFT     COLON POLYP EXCISION     COLON SURGERY     COLONOSCOPY WITH PROPOFOL N/A 04/18/2017   Procedure: COLONOSCOPY WITH PROPOFOL;  Surgeon: Scot Jun, MD;  Location: [facility] ENDOSCOPY;  Service: Endoscopy;  Laterality: N/A;   COLONOSCOPY WITH PROPOFOL N/A 04/26/2023   Procedure: COLONOSCOPY WITH PROPOFOL;  Surgeon: Jaynie Collins, DO;  Location: [facility] ENDOSCOPY;  Service: Gastroenterology;  Laterality: N/A;   ESOPHAGOGASTRODUODENOSCOPY     ESOPHAGOGASTRODUODENOSCOPY  (EGD) WITH PROPOFOL N/A 04/23/2023   Procedure: ESOPHAGOGASTRODUODENOSCOPY (EGD) WITH PROPOFOL;  Surgeon: Jaynie Collins, DO;  Location: [facility] ENDOSCOPY;  Service: Gastroenterology;  Laterality: N/A;   GIVENS CAPSULE STUDY  04/26/2023   Procedure: GIVENS CAPSULE STUDY;  Surgeon: Jaynie Collins, DO;  Location: [facility] ENDOSCOPY;  Service: Gastroenterology;;   IR GENERIC HISTORICAL  01/21/2016   IR RADIOLOGIST EVAL & MGMT 01/21/2016 Irish Lack, MD [facility] INTERV RAD   IR GENERIC HISTORICAL  10/22/2014   IR RADIOLOGIST EVAL & MGMT 10/22/2014 Irish Lack, MD [facility] INTERV RAD   IR IMAGING GUIDED PORT INSERTION  05/28/2022   IR RADIOLOGIST EVAL & MGMT  02/02/2017   IR RADIOLOGIST EVAL & MGMT  03/08/2018   percutaneous biopsy     POLYPECTOMY  04/26/2023   Procedure: POLYPECTOMY;  Surgeon: Jaynie Collins, DO;  Location: [facility] ENDOSCOPY;  Service: Gastroenterology;;   VIDEO BRONCHOSCOPY WITH ENDOBRONCHIAL ULTRASOUND Right 05/12/2022  Procedure: VIDEO BRONCHOSCOPY WITH ENDOBRONCHIAL ULTRASOUND;  Surgeon: Salena Saner, MD;  Location: [facility] ORS;  Service: Cardiopulmonary;  Laterality: Right;       Recent HPI and Hospital Course  Hospital Course: Derek Webb is a 72 y.o. male with PMH of CVA, seizures, alcohol use, squamous cell carcinoma of right lung, s/p radiation and started Durvalumab on 08/13/2022, plan for total 12 cycles as per oncology.  PET scan was done as an outpatient which showed no recurrence of cancer.  Hypermetabolic incidental left prostate nodule, patient was seen by urologist Dr. Morrie Sheldon, per note low suspicion for prostate cancer.  Recommended to follow-up in 1 year with urologist, ascending thoracic aortic aneurysm 4.4 cm was diagnosed in August 2023, recommended annual follow-up but patient is not aware, patient has follow-up with GI in 4 weeks, as reviewed from EMR, patient was sent by oncologist for anemia.  Hemoglobin dropped from 11-6.7 so patient was sent to ED  for further workup and management.  During hospital stay he is seen by GI - s/p EGD found to have mild gastritis, no bleeding.   9/3 s/p colonoscopy: Four subcentimeter polyps removed. Hemorrhoids noted. Diverticulosis throughout the colon.  One of the ascending diverticula appeared to have a clot in it which may be the source of the patient's anemia and dark stool S/p video capsule endoscopy -  GI recommended as capsule did not reach the colon, he will need KUB in a week to assess for capsule passage.  His hemoglobin stable, no active bleeding. Seen by PT recommended HH PT. I advised him to follow up with PCP, GI, oncology upon discharge as instructed. He understands and agrees with discharge plan.   Assessment and Plan: Symptomatic anemia, unknown cause, no overt GI bleeding Hb around 8 stable now, no active bleeding. s/p EGD found to have mild gastritis, no bleeding.   9/3 s/p colonoscopy: Four subcentimeter polyps removed. Hemorrhoids noted. Diverticulosis throughout the colon.  One of the ascending diverticula appeared to have a clot in it which may be the source of the patient's anemia and dark stool S/p 1 unit PRBC transfused upon admission, 2nd u PRBC transfusion given on 9/1. IV PPI transitioned to pantoprazole 40 mg p.o. twice daily S/p video capsule endoscopy -  GI recommended as capsule did not reach the colon, he will need KUB in a week to assess for capsule passage. GI office will contact him for follow up appointment. Restarted Plavix.   Hypokalemia, potassium repleted. Monitor daily electrolytes Hypomagnesemia, mag repleted. Monitor electrolytes and replete as needed. Iron deficiency, transferrin saturation 12% Continue oral iron supplement with vitamin C B12 level 373, continue oral supplement.   Hypertension ACEI restarted BP stable.   H/o CVA and seizures Continue seizure precaution Continue Dilantin. Continue Lipitor and Zetia Restarted Plavix.   Ascending  thoracic aortic aneurysm Patient denies any chest pain, it seems stable.  Patient was not aware of aneurysm.  It was diagnosed in August 2023 Patient already received IV contrast for CT abdomen so to avoid excessive contrast, recommend to follow-up with an outpatient to repeat CTA chest as an outpatient.   GERD on PPI twice daily.   Squamous cell carcinoma of the lung Continue to follow oncologist as per recommendation and continue chemotherapy.   Suspected prostate cancer, recommended to follow with the urologist as an outpatient  Post Hospital Acute Care Issue to be followed in the Clinic   Anemia Active Problems:   Squamous cell lung cancer (HCC)  Cerebrovascular accident (CVA) (HCC)   Essential hypertension   GERD (gastroesophageal reflux disease)   Seizures (HCC)   Aortic dilatation (HCC)   Acute blood loss anemia Diverticulosis Internal hemorrhoids Hypokalemia Hypomagnesemia Iron and b12 deficiency   Subjective:   Derek Webb today has, No headache, No chest pain, No abdominal pain - No Nausea, No new weakness tingling or numbness, No Cough - SOB. fatigue  Assessment & Plan   1. Hospital discharge follow-up Treated for anemia in the hospital, no further issues   2. Anemia, unspecified type Repeat CBC - CBC with Differential/Platelet  3. Essential hypertension Refills ordered, continue as prescribed  - enalapril (VASOTEC) 10 MG tablet; Take 1 tablet (10 mg total) by mouth daily.  Dispense: 90 tablet; Refill: 1  4. Obstructive chronic bronchitis without exacerbation Continue symbicort as prescribed  - budesonide-formoterol (SYMBICORT) 160-4.5 MCG/ACT inhaler; Inhale 1 puff into the lungs 2 (two) times daily.  Dispense: 1 each; Refill: 5  5. History of seizure disorder Continue phenytoin as prescribed.  - phenytoin (DILANTIN) 100 MG ER capsule; Take 4 capsules (400 mg total) by mouth at bedtime.  Dispense: 360 capsule; Refill: 1    Reason for frequent  admissions/ER visits   Anemia, Seizures Hypertension Low potassium Diverticulosis GERD      Objective:   Vitals:   05/18/23 1105  BP: 110/68  Pulse: 60  Resp: 16  Temp: 97.6 F (36.4 C)  SpO2: 99%  Weight: 185 lb 3.2 oz (84 kg)  Height: 5\' 7"  (1.702 m)    Wt Readings from Last 3 Encounters:  06/14/23 189 lb (85.7 kg)  05/23/23 187 lb (84.8 kg)  05/18/23 185 lb 3.2 oz (84 kg)    Allergies as of 05/18/2023   No Known Allergies      Medication List        Accurate as of May 18, 2023 11:59 PM. If you have any questions, ask your nurse or doctor.          ascorbic acid 500 MG tablet Commonly known as: VITAMIN C Take 1 tablet (500 mg total) by mouth daily.   aspirin 81 MG tablet Take 81 mg by mouth daily.   atorvastatin 80 MG tablet Commonly known as: LIPITOR Take 1 tablet (80 mg total) by mouth daily.   budesonide-formoterol 160-4.5 MCG/ACT inhaler Commonly known as: SYMBICORT Inhale 1 puff into the lungs 2 (two) times daily.   clopidogrel 75 MG tablet Commonly known as: PLAVIX Take 1 tablet (75 mg total) by mouth daily.   cyanocobalamin 1000 MCG tablet Take 1 tablet (1,000 mcg total) by mouth daily.   enalapril 10 MG tablet Commonly known as: VASOTEC Take 1 tablet (10 mg total) by mouth daily. What changed:  medication strength how much to take Changed by: Besse Miron   ezetimibe 10 MG tablet Commonly known as: ZETIA Take 1 tablet (10 mg total) by mouth daily.   iron polysaccharides 150 MG capsule Commonly known as: NIFEREX Take 1 capsule (150 mg total) by mouth daily.   pantoprazole 40 MG tablet Commonly known as: PROTONIX Take 1 tablet (40 mg total) by mouth 2 (two) times daily. IN AM   phenytoin 100 MG ER capsule Commonly known as: DILANTIN Take 4 capsules (400 mg total) by mouth at bedtime.   Vitamin D 50 MCG (2000 UT) Caps Take by mouth. VITAMIN D 3 2000 IU  ONE SOFTGEL DAILY  --OTC         Physical  Exam: Constitutional: Patient  appears well-developed and well-nourished. Not in obvious distress. HENT: Normocephalic, atraumatic, External right and left ear normal. Oropharynx is clear and moist.  Eyes: PERRLA CVS: RRR, S1/S2 +, no murmurs, no gallops, no carotid bruit.  Pulmonary: Effort and breath sounds normal, no stridor, rhonchi, wheezes, rales.  Lymphadenopathy: No lymphadenopathy noted, cervical, inguinal or axillary Neuro: Alert. Oriented  Skin: Skin is warm and dry. No rash noted. Not diaphoretic. No erythema. No pallor.    Data Review   Micro Results No results found for this or any previous visit (from the past 240 hour(s)).   CBC No results for input(s): "WBC", "HGB", "HCT", "PLT", "MCV", "MCH", "MCHC", "RDW", "LYMPHSABS", "MONOABS", "EOSABS", "BASOSABS", "BANDABS" in the last 168 hours.  Invalid input(s): "NEUTRABS", "BANDSABD"   Chemistries  No results for input(s): "NA", "K", "CL", "CO2", "GLUCOSE", "BUN", "CREATININE", "CALCIUM", "MG", "AST", "ALT", "ALKPHOS", "BILITOT" in the last 168 hours.  Invalid input(s): "GFRCGP"  ------------------------------------------------------------------------------------------------------------------ CrCl cannot be calculated (Patient's most recent lab result is older than the maximum 21 days allowed.). ------------------------------------------------------------------------------------------------------------------ No results for input(s): "HGBA1C" in the last 72 hours. ------------------------------------------------------------------------------------------------------------------ No results for input(s): "CHOL", "HDL", "LDLCALC", "TRIG", "CHOLHDL", "LDLDIRECT" in the last 72 hours. ------------------------------------------------------------------------------------------------------------------ No results for input(s): "TSH", "T4TOTAL", "T3FREE", "THYROIDAB" in the last 72 hours.  Invalid input(s):  "FREET3"  ------------------------------------------------------------------------------------------------------------------ No results for input(s): "VITAMINB12", "FOLATE", "FERRITIN", "TIBC", "IRON", "RETICCTPCT" in the last 72 hours.  Coagulation profile No results for input(s): "INR", "PROTIME" in the last 168 hours.  No results for input(s): "DDIMER" in the last 72 hours.  Cardiac Enzymes No results for input(s): "CKMB", "TROPONINI", "MYOGLOBIN" in the last 168 hours.  Invalid input(s): "CK" ------------------------------------------------------------------------------------------------------------------ Invalid input(s): "POCBNP"  Return for regular follow up with lauren.   Time Spent in minutes  45 Time spent with patient included reviewing progress notes, labs, imaging studies, and discussing plan for follow up.   This patient was seen by Sallyanne Kuster, FNP-C in collaboration with Dr. Beverely Risen as a part of collaborative care agreement.    Sallyanne Kuster MSN, FNP-C on 05/18/2023 at 11:53 AM   **Disclaimer: This note may have been dictated with voice recognition software. Similar sounding words can inadvertently be transcribed and this note may contain transcription errors which may not have been corrected upon publication of note.**

## 2023-05-20 ENCOUNTER — Inpatient Hospital Stay: Payer: 59 | Admitting: Nurse Practitioner

## 2023-05-23 ENCOUNTER — Ambulatory Visit
Admission: RE | Admit: 2023-05-23 | Discharge: 2023-05-23 | Disposition: A | Discharge status: Home / Self Care | Payer: 59 | Source: Ambulatory Visit | Attending: Radiation Oncology | Admitting: Radiation Oncology

## 2023-05-23 VITALS — BP 97/65 | HR 67 | Temp 97.6°F | Resp 16 | Wt 187.0 lb

## 2023-05-23 DIAGNOSIS — Z9221 Personal history of antineoplastic chemotherapy: Secondary | ICD-10-CM | POA: Insufficient documentation

## 2023-05-23 DIAGNOSIS — C3411 Malignant neoplasm of upper lobe, right bronchus or lung: Secondary | ICD-10-CM | POA: Diagnosis not present

## 2023-05-23 DIAGNOSIS — Z87891 Personal history of nicotine dependence: Secondary | ICD-10-CM | POA: Diagnosis not present

## 2023-05-23 DIAGNOSIS — C3490 Malignant neoplasm of unspecified part of unspecified bronchus or lung: Secondary | ICD-10-CM

## 2023-05-23 DIAGNOSIS — Z923 Personal history of irradiation: Secondary | ICD-10-CM | POA: Diagnosis not present

## 2023-05-23 NOTE — Progress Notes (Signed)
Radiation Oncology Follow up Note  Name: Derek Webb   Date:   05/23/2023 MRN:  [id number] DOB: 06-Dec-1950    This 72 y.o. 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  REFERRING PROVIDER: Carlean Jews, PA*  HPI: Patient is a 72 year old male now out 10 months having completed concurrent chemoradiation therapy.  Patient's stage was 3A (T4 N0 M0).  He underwent weekly carboplatinum and Taxol with radiation therapy completed in 23.  He is currently on maintenance Durvalumab with a total of 2012 cycles planned.  He is doing well specifically [place] cough hemoptysis or chest tightness.  He had a recent CT scan performed which has not been formally read.  On my review shows stable changes in the chest with loss of volume and consolidation of the right lower lobe unchanged from prior exam and PET negative on previous PET CT scan.  COMPLICATIONS OF TREATMENT: none  FOLLOW UP COMPLIANCE: keeps appointments   PHYSICAL EXAM:  BP 97/65   Pulse 67   Temp 97.6 F (36.4 C) (Tympanic)   Resp 16   Wt 187 lb (84.8 kg)   BMI 29.29 kg/m  Well-developed well-nourished patient in NAD. HEENT reveals PERLA, EOMI, discs not visualized.  Oral cavity is clear. No oral mucosal lesions are identified. Neck is clear without evidence of cervical or supraclavicular adenopathy. Lungs are clear to A&P. Cardiac examination is essentially unremarkable with regular rate and rhythm without murmur rub or thrill. Abdomen is benign with no organomegaly or masses noted. Motor sensory and DTR levels are equal and symmetric in the upper and lower extremities. Cranial nerves II through XII are grossly intact. Proprioception is intact. No peripheral adenopathy or edema is identified. No motor or sensory levels are noted. Crude visual fields are within normal range.  RADIOLOGY RESULTS: CT scan and PET CT scans  reviewed compatible with above-stated findings  PLAN: Present time patient is stable with no evidence of progressive disease by CT criteria.  He continues on maintenance Durvalumab under medical oncology's direction.  Of asked to see him back in 6 months for follow-up.  Patient and family know to call with any concerns.  I would like to take this opportunity to thank you for allowing me to participate in the care of your patient.Carmina Miller, MD

## 2023-05-24 DIAGNOSIS — J449 Chronic obstructive pulmonary disease, unspecified: Secondary | ICD-10-CM | POA: Diagnosis not present

## 2023-05-25 DIAGNOSIS — D509 Iron deficiency anemia, unspecified: Secondary | ICD-10-CM | POA: Diagnosis not present

## 2023-05-25 DIAGNOSIS — I700 Atherosclerosis of aorta: Secondary | ICD-10-CM | POA: Diagnosis not present

## 2023-05-25 DIAGNOSIS — K449 Diaphragmatic hernia without obstruction or gangrene: Secondary | ICD-10-CM | POA: Diagnosis not present

## 2023-05-25 DIAGNOSIS — K209 Esophagitis, unspecified without bleeding: Secondary | ICD-10-CM | POA: Diagnosis not present

## 2023-05-25 DIAGNOSIS — D62 Acute posthemorrhagic anemia: Secondary | ICD-10-CM | POA: Diagnosis not present

## 2023-05-25 DIAGNOSIS — K2970 Gastritis, unspecified, without bleeding: Secondary | ICD-10-CM | POA: Diagnosis not present

## 2023-05-25 DIAGNOSIS — I10 Essential (primary) hypertension: Secondary | ICD-10-CM | POA: Diagnosis not present

## 2023-05-25 DIAGNOSIS — K5730 Diverticulosis of large intestine without perforation or abscess without bleeding: Secondary | ICD-10-CM | POA: Diagnosis not present

## 2023-05-25 DIAGNOSIS — D125 Benign neoplasm of sigmoid colon: Secondary | ICD-10-CM | POA: Diagnosis not present

## 2023-05-25 DIAGNOSIS — C3491 Malignant neoplasm of unspecified part of right bronchus or lung: Secondary | ICD-10-CM | POA: Diagnosis not present

## 2023-05-25 DIAGNOSIS — E7800 Pure hypercholesterolemia, unspecified: Secondary | ICD-10-CM | POA: Diagnosis not present

## 2023-05-25 DIAGNOSIS — D124 Benign neoplasm of descending colon: Secondary | ICD-10-CM | POA: Diagnosis not present

## 2023-05-25 DIAGNOSIS — I77819 Aortic ectasia, unspecified site: Secondary | ICD-10-CM | POA: Diagnosis not present

## 2023-05-25 DIAGNOSIS — C3492 Malignant neoplasm of unspecified part of left bronchus or lung: Secondary | ICD-10-CM | POA: Diagnosis not present

## 2023-05-25 DIAGNOSIS — E876 Hypokalemia: Secondary | ICD-10-CM | POA: Diagnosis not present

## 2023-05-25 DIAGNOSIS — D630 Anemia in neoplastic disease: Secondary | ICD-10-CM | POA: Diagnosis not present

## 2023-05-25 DIAGNOSIS — K640 First degree hemorrhoids: Secondary | ICD-10-CM | POA: Diagnosis not present

## 2023-05-25 DIAGNOSIS — J449 Chronic obstructive pulmonary disease, unspecified: Secondary | ICD-10-CM | POA: Diagnosis not present

## 2023-05-25 DIAGNOSIS — H9190 Unspecified hearing loss, unspecified ear: Secondary | ICD-10-CM | POA: Diagnosis not present

## 2023-05-25 DIAGNOSIS — D120 Benign neoplasm of cecum: Secondary | ICD-10-CM | POA: Diagnosis not present

## 2023-05-25 DIAGNOSIS — K219 Gastro-esophageal reflux disease without esophagitis: Secondary | ICD-10-CM | POA: Diagnosis not present

## 2023-05-25 DIAGNOSIS — D122 Benign neoplasm of ascending colon: Secondary | ICD-10-CM | POA: Diagnosis not present

## 2023-05-25 DIAGNOSIS — I7121 Aneurysm of the ascending aorta, without rupture: Secondary | ICD-10-CM | POA: Diagnosis not present

## 2023-05-31 ENCOUNTER — Inpatient Hospital Stay: Payer: 59 | Attending: Internal Medicine

## 2023-05-31 ENCOUNTER — Encounter: Payer: Self-pay | Admitting: Internal Medicine

## 2023-05-31 VITALS — BP 117/72 | HR 75 | Temp 97.9°F | Resp 17

## 2023-05-31 DIAGNOSIS — D509 Iron deficiency anemia, unspecified: Secondary | ICD-10-CM | POA: Insufficient documentation

## 2023-05-31 DIAGNOSIS — E7800 Pure hypercholesterolemia, unspecified: Secondary | ICD-10-CM | POA: Insufficient documentation

## 2023-05-31 DIAGNOSIS — C3490 Malignant neoplasm of unspecified part of unspecified bronchus or lung: Secondary | ICD-10-CM

## 2023-05-31 DIAGNOSIS — Z5112 Encounter for antineoplastic immunotherapy: Secondary | ICD-10-CM | POA: Diagnosis not present

## 2023-05-31 DIAGNOSIS — C3411 Malignant neoplasm of upper lobe, right bronchus or lung: Secondary | ICD-10-CM | POA: Insufficient documentation

## 2023-05-31 DIAGNOSIS — I10 Essential (primary) hypertension: Secondary | ICD-10-CM | POA: Diagnosis not present

## 2023-05-31 MED ORDER — SODIUM CHLORIDE 0.9 % IV SOLN
Freq: Once | INTRAVENOUS | Status: AC
  Filled 2023-05-31: qty 250

## 2023-05-31 MED ORDER — SODIUM CHLORIDE 0.9 % IV SOLN
200.0000 mg | Freq: Once | INTRAVENOUS | Status: AC
  Administered 2023-05-31: 200 mg via INTRAVENOUS
  Filled 2023-05-31: qty 200

## 2023-06-13 ENCOUNTER — Other Ambulatory Visit: Payer: Self-pay | Admitting: *Deleted

## 2023-06-13 ENCOUNTER — Encounter: Payer: Self-pay | Admitting: Internal Medicine

## 2023-06-13 DIAGNOSIS — D509 Iron deficiency anemia, unspecified: Secondary | ICD-10-CM

## 2023-06-14 ENCOUNTER — Inpatient Hospital Stay (HOSPITAL_BASED_OUTPATIENT_CLINIC_OR_DEPARTMENT_OTHER): Payer: 59 | Admitting: Internal Medicine

## 2023-06-14 ENCOUNTER — Ambulatory Visit: Payer: 59

## 2023-06-14 ENCOUNTER — Ambulatory Visit: Payer: 59 | Admitting: Internal Medicine

## 2023-06-14 ENCOUNTER — Inpatient Hospital Stay: Payer: 59

## 2023-06-14 ENCOUNTER — Other Ambulatory Visit: Payer: 59

## 2023-06-14 VITALS — BP 107/64 | HR 72 | Temp 98.9°F | Wt 189.0 lb

## 2023-06-14 DIAGNOSIS — C3411 Malignant neoplasm of upper lobe, right bronchus or lung: Secondary | ICD-10-CM | POA: Diagnosis not present

## 2023-06-14 DIAGNOSIS — C3491 Malignant neoplasm of unspecified part of right bronchus or lung: Secondary | ICD-10-CM

## 2023-06-14 DIAGNOSIS — Z5112 Encounter for antineoplastic immunotherapy: Secondary | ICD-10-CM

## 2023-06-14 DIAGNOSIS — D509 Iron deficiency anemia, unspecified: Secondary | ICD-10-CM

## 2023-06-14 DIAGNOSIS — E7800 Pure hypercholesterolemia, unspecified: Secondary | ICD-10-CM | POA: Diagnosis not present

## 2023-06-14 DIAGNOSIS — C3490 Malignant neoplasm of unspecified part of unspecified bronchus or lung: Secondary | ICD-10-CM

## 2023-06-14 DIAGNOSIS — I10 Essential (primary) hypertension: Secondary | ICD-10-CM | POA: Diagnosis not present

## 2023-06-14 LAB — COMPREHENSIVE METABOLIC PANEL
ALT: 14 U/L (ref 0–44)
AST: 16 U/L (ref 15–41)
Albumin: 3.5 g/dL (ref 3.5–5.0)
Alkaline Phosphatase: 71 U/L (ref 38–126)
Anion gap: 5 (ref 5–15)
BUN: 10 mg/dL (ref 8–23)
CO2: 27 mmol/L (ref 22–32)
Calcium: 8.7 mg/dL — ABNORMAL LOW (ref 8.9–10.3)
Chloride: 105 mmol/L (ref 98–111)
Creatinine, Ser: 0.63 mg/dL (ref 0.61–1.24)
GFR, Estimated: >60 mL/min (ref >60)
Glucose, Bld: 123 mg/dL — ABNORMAL HIGH (ref 70–99)
Potassium: 3.3 mmol/L — ABNORMAL LOW (ref 3.5–5.1)
Sodium: 137 mmol/L (ref 135–145)
Total Bilirubin: 0.4 mg/dL (ref 0.3–1.2)
Total Protein: 7 g/dL (ref 6.5–8.1)

## 2023-06-14 LAB — CBC WITH DIFFERENTIAL/PLATELET
Abs Immature Granulocytes: 0.01 10*3/uL (ref 0.00–0.07)
Basophils Absolute: 0 10*3/uL (ref 0.0–0.1)
Basophils Relative: 1 %
Eosinophils Absolute: 0.2 10*3/uL (ref 0.0–0.5)
Eosinophils Relative: 6 %
HCT: 30.3 % — ABNORMAL LOW (ref 39.0–52.0)
Hemoglobin: 9.6 g/dL — ABNORMAL LOW (ref 13.0–17.0)
Immature Granulocytes: 0 %
Lymphocytes Relative: 35 %
Lymphs Abs: 1.1 10*3/uL (ref 0.7–4.0)
MCH: 27.6 pg (ref 26.0–34.0)
MCHC: 31.7 g/dL (ref 30.0–36.0)
MCV: 87.1 fL (ref 80.0–100.0)
Monocytes Absolute: 0.4 10*3/uL (ref 0.1–1.0)
Monocytes Relative: 14 %
Neutro Abs: 1.4 10*3/uL — ABNORMAL LOW (ref 1.7–7.7)
Neutrophils Relative %: 44 %
Platelets: 414 10*3/uL — ABNORMAL HIGH (ref 150–400)
RBC: 3.48 MIL/uL — ABNORMAL LOW (ref 4.22–5.81)
RDW: 16 % — ABNORMAL HIGH (ref 11.5–15.5)
WBC: 3.2 10*3/uL — ABNORMAL LOW (ref 4.0–10.5)
nRBC: 0 % (ref 0.0–0.2)

## 2023-06-14 LAB — SAMPLE TO BLOOD BANK

## 2023-06-14 MED ORDER — SODIUM CHLORIDE 0.9 % IV SOLN
1500.0000 mg | Freq: Once | INTRAVENOUS | Status: AC
  Administered 2023-06-14: 1500 mg via INTRAVENOUS
  Filled 2023-06-14: qty 30

## 2023-06-14 MED ORDER — SODIUM CHLORIDE 0.9 % IV SOLN
200.0000 mg | Freq: Once | INTRAVENOUS | Status: AC
  Administered 2023-06-14: 200 mg via INTRAVENOUS
  Filled 2023-06-14: qty 200

## 2023-06-14 MED ORDER — POTASSIUM CHLORIDE CRYS ER 20 MEQ PO TBCR: 20.0000 meq | EXTENDED_RELEASE_TABLET | Freq: Every day | ORAL | 0 refills | Status: DC

## 2023-06-14 MED ORDER — SODIUM CHLORIDE 0.9 % IV SOLN
Freq: Once | INTRAVENOUS | Status: AC
  Filled 2023-06-14: qty 250

## 2023-06-14 MED ORDER — HEPARIN SOD (PORK) LOCK FLUSH 100 UNIT/ML IV SOLN
500.0000 [IU] | Freq: Once | INTRAVENOUS | Status: AC | PRN
  Administered 2023-06-14: 500 [IU]
  Filled 2023-06-14: qty 5

## 2023-06-14 NOTE — Progress Notes (Signed)
Cancer Center CONSULT NOTE  Patient Care Team: [name], [name] as PCP - General (Physician Assistant) [name], [facility] (Inactive) as Pharmacist (Pharmacist) [name], RN as Oncology Nurse Navigator Michaelyn Barter, MD as Consulting Physician (Oncology) Yevonne Pax, MD as Consulting Physician (Internal Medicine)   CANCER STAGING   Cancer Staging  Squamous cell lung cancer [facility] Staging form: Lung, AJCC 7th Edition - Clinical: Stage IIIA (T4, N0, M0) - Signed by Michaelyn Barter, MD on 06/03/2022  CURRENT TREATMENT- Weekly Carboplatin and Taxol with RT completed on 07/27/2022.  Maintenance Durvalumab started 08/13/2022. Plan total 12 cycles.   ASSESSMENT & PLAN:  Derek Webb 72 y.o. 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  #RUL of lung SCCa, atleast Stage IIIA (cT4N0M0) #Encounter for antineoplastic immunotherapy -s/p bronchoscopy with EBUS by Dr. Jayme Cloud on 05/12/2022.  -Completed 5 cycles of CarboTaxol and RT on 07/27/2022. Cycle 6 was canceled due to persistent neutropenia.    -Now on Durvalumab maintenance.  Labs reviewed and acceptable for treatment.  Will proceed with cycle 11 of Durvalumab 1500 mg every 4 weeks.  He will complete maintenance Durvalumab next month. -CT chest from 05/16/2023 reviewed with no concern for cancer recurrence.  # Iron deficiency anemia - Admitted from 8/29 to 04/29/2023-colonoscopy with Dr. Timothy Lasso showed multiple polyps, small dark clot appearing in one of the ascending diverticuli which may have been the source of blood loss.  Upper endoscopy showed gastritis.  Pathology showed tubular adenoma.  VCE erosions in stomach that were seen on EGD.  Capsule did not reach colon.  -Continue with oral iron.  Proceed with IV iron  today.  He will receive his fifth dose of IV Venofer next month.  Repeat iron panel next time.  Hemoglobin is improving.  # Incidental hypermetabolism in prostate -Denies any urinary symptoms.  PSA level is normal at 3.14. -Follows with Dr. [name]  # Access - port placed on 05/28/2022   Orders Placed This Encounter  Procedures   Iron and TIBC    Standing Status:   Future    Standing Expiration Date:   06/13/2024   Ferritin    Standing Status:   Future    Standing Expiration Date:   06/13/2024   RTC in 1 month for MD visit, labs, cycle 12 of Durvalumab, venofer  The total time spent in the appointment was 30 minutes encounter with patients including review of chart and various tests results, discussions about plan of care and coordination of care plan   All questions were answered. The patient knows to call the clinic with any problems, questions or concerns. No barriers to learning was detected.  Michaelyn Barter, MD 10/22/202412:48 PM   HISTORY OF PRESENTING ILLNESS:  Derek Webb 72 y.o. 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  Patient was seen today accompanied by [name].  Denies any cough, hemoptysis, shortness of breath and weight loss. Remote smoker.  Quit in 2013. Has family history of lung cancer in father.  INTERVAL HISTORY-  Patient seen today prior to cycle 11 of Durvalumab.  Accompanied with sister. Patient is doing well  overall.  Denies any new concerns or symptoms.  I have reviewed his chart and materials related to his cancer extensively and collaborated history with the patient. Summary of oncologic history is as follows: Oncology History Overview Note  History of left upper lobe lung SCC status post microwave thermal ablation in 2013 by Dr.  Fredia Sorrow.  He was deemed not a surgical candidate and had transportation issues for radiation treatment.   Squamous cell lung cancer (HCC)  01/12/2022 Initial Diagnosis   Patient has been following with Dr. Welton Flakes of pulmonary for suspicious lung nodule. CT chest in March 2021 showed post ablation scarring in LUL not changed and increased atelectasis of RML and RUL. He did not follow through.   CT chest in 01/12/2022 showed  New abrupt and slightly irregular cut off of the right upper lobe bronchus with associated right upper lobe atelectasis and postobstructive changes. Findings are concerning for endobronchial malignancy    04/08/2022 PET scan   IMPRESSION: 1. Hypermetabolic central right upper lobe lung mass nearly occluding the right upper lobe bronchus, poorly delineated on the noncontrast CT images, measuring approximately 3.8 x 2.9 cm, compatible with primary bronchogenic malignancy. 2. Postobstructive pneumonia/atelectasis throughout the basilar right upper lobe. 3. No hypermetabolic metastatic thoracic adenopathy or distant metastatic disease. 4. Stable mild post ablation change in the left upper lobe with no metabolic evidence of local tumor recurrence in this location. 5. Dilated 4.4 cm ascending thoracic aorta. Recommend annual imaging followup by CTA or MRA   05/12/2022 Procedure   Procedure was delayed due to miscommunication about holding Plavix. S/p bronchoscopy with EBUS by Dr. Jayme Cloud. Op note mentions significant bulky adenopathy in the subcarinal space and in the right hilum.    05/12/2022 Pathology Results   DIAGNOSIS:  A.  LUNG, RIGHT UPPER LOBE; ENB-ASSISTED BIOPSY:  - SQUAMOUS CELL CARCINOMA.   LUNG, RIGHT UPPER LOBE; EBUS-ASSISTED BRUSHING:  - POSITIVE FOR MALIGNANCY.  - SQUAMOUS CELL CARCINOMA IS PRESENT.   LUNG, RIGHT UPPER LOBE; EBUS-ASSISTED LAVAGE:  - POSITIVE FOR MALIGNANCY.  - SQUAMOUS CELL CARCINOMA IS PRESENT  SUBCARINAL SPACE, RIGHT;  EBUS-ASSISTED FNA:  - POSITIVE FOR MALIGNANCY.  - SQUAMOUS CELL CARCINOMA IS PRESENT   05/18/2022 Cancer Staging   Staging form: Lung, AJCC 7th Edition - Clinical: Stage IIIA (T4, N0, M0) - Signed by Michaelyn Barter, MD on 06/03/2022   05/21/2022 Imaging   MRI brain  No evidence of metastatic disease in the brain. 2. Small enhancing foci in the right parietal and right occipital calvarium, which are indeterminate but could represent small osseous metastases. Attention on follow-up   06/03/2022 - 07/27/2022 Chemotherapy   Patient is on Treatment Plan : LUNG Carboplatin + Paclitaxel + XRT q7d      08/03/2022 -  Chemotherapy   Patient is on Treatment Plan : LUNG NSCLC Durvalumab (1500) q28d       Imaging   - CT chest with contrast (11/30/2022) was reviewed at the tumor conference.  There is interval development of marked interstitial disease and patchy airspace disease more confluent consolidative opacity in the peripheral right upper lobe progressive and interval.  In the lower lung there is ill-defined tree-in-bud nodularity with nodular areas measuring up to 1.4 cm in the posterior left lower lobe.  Features are suggestive of infectious and inflammatory etiology however disease progression cannot be excluded.  Liver lesion is stable and likely hemangioma.  Patient continues to be asymptomatic.  Cycle 6 of Durvalumab was held.    - Repeat  CT chest with contrast (01/19/2023) showed interval improvement in the diffuse interstitial lung disease seen on prior study.  But there is still some persistent interstitial patchy area of consolidative airspace in the left upper lobe.  Ill-defined nodularity in the lower lobe has largely resolved although some component of dominant nodularity persist.  There is continued progression of volume loss in the right hemithorax with consolidative opacity in the right posterior apex and suprahilar right lung.  Right upper lobe airway is no longer patent.  Could be  posttreatment related versus recurrent disease.   -PET/CT (02/08/2023) showed radiation changes in the right upper and medial left upper lobe.  No suspicious findings for residual or recurrent lung cancer.  Focal left prostate hypermetabolic some raising possibility of prostate cancer.  - MRI brain with no evidence of intracranial metastasis.  Unchanged subcentimeter enhancing focus in the right parietal calvarium nonspecific.     MEDICAL HISTORY:  Past Medical History:  Diagnosis Date   Alcoholism (HCC)    COPD (chronic obstructive pulmonary disease) (HCC)    SEVERE COPD -OCCASIONALLY USES OXYGEN AT NIGHT-DOES NOT USE INHALERS ON REGULAR BASIS   Elevated cholesterol    GERD (gastroesophageal reflux disease)    HOH (hard of hearing)    Hypertension    Lung cancer (HCC)    LEFT UPPER LUNG CARCINOMA-NOT A CANDIDATE FOR SURGICAL RESECTION BECAUSE OF HIS COPD AND POOR CANDIDATE FOR RADIATION BECAUSE OF TRANSPORTATION PROBLEMS   Seizures (HCC)    CHRONIC DILATIN - PT STATES NO SEIZURES IN PAST COUPLE OF YEARS; PT STATES HIS SEIZURES CAUSED NUMBNESS OF ARMS AND HANDS AND NOT ABLE TO MOVE HIS ARMS   Stroke (HCC)    2 TO 3 YRS AGO-AFFECTED HIS SPEECH--ABLE TO AMBULATE WITHOUT ASSIST AND DOES YARD WORK.  DECREASED HEARING IN BOTH EARS SINCE STROKE.   Vitamin D deficiency     SURGICAL HISTORY: Past Surgical History:  Procedure Laterality Date   CAROTID SURGERY 2009 -LEFT     COLON POLYP EXCISION     COLON SURGERY     COLONOSCOPY WITH PROPOFOL N/A 04/18/2017   Procedure: COLONOSCOPY WITH PROPOFOL;  Surgeon: Scot Jun, MD;  Location: [facility] ENDOSCOPY;  Service: Endoscopy;  Laterality: N/A;   COLONOSCOPY WITH PROPOFOL N/A 04/26/2023   Procedure: COLONOSCOPY WITH PROPOFOL;  Surgeon: Jaynie Collins, DO;  Location: [facility] ENDOSCOPY;  Service: Gastroenterology;  Laterality: N/A;   ESOPHAGOGASTRODUODENOSCOPY     ESOPHAGOGASTRODUODENOSCOPY (EGD) WITH PROPOFOL N/A 04/23/2023   Procedure:  ESOPHAGOGASTRODUODENOSCOPY (EGD) WITH PROPOFOL;  Surgeon: Jaynie Collins, DO;  Location: [facility] ENDOSCOPY;  Service: Gastroenterology;  Laterality: N/A;   GIVENS CAPSULE STUDY  04/26/2023   Procedure: GIVENS CAPSULE STUDY;  Surgeon: Jaynie Collins, DO;  Location: [facility] ENDOSCOPY;  Service: Gastroenterology;;   IR GENERIC HISTORICAL  01/21/2016   IR RADIOLOGIST EVAL & MGMT 01/21/2016 Irish Lack, MD [facility] INTERV RAD   IR GENERIC HISTORICAL  10/22/2014   IR RADIOLOGIST EVAL & MGMT 10/22/2014 Irish Lack, MD [facility] INTERV RAD   IR IMAGING GUIDED PORT INSERTION  05/28/2022   IR RADIOLOGIST EVAL & MGMT  02/02/2017   IR RADIOLOGIST EVAL & MGMT  03/08/2018   percutaneous biopsy     POLYPECTOMY  04/26/2023   Procedure: POLYPECTOMY;  Surgeon: Jaynie Collins, DO;  Location: [facility] ENDOSCOPY;  Service: Gastroenterology;;   VIDEO BRONCHOSCOPY WITH ENDOBRONCHIAL ULTRASOUND Right 05/12/2022   Procedure: VIDEO BRONCHOSCOPY WITH ENDOBRONCHIAL ULTRASOUND;  Surgeon: Salena Saner, MD;  Location: [facility] ORS;  Service:  Cardiopulmonary;  Laterality: Right;    SOCIAL HISTORY: Social History   Socioeconomic History   Marital status: Single    Spouse name: Not on file   Number of children: Not on file   Years of education: Not on file   Highest education level: Not on file  Occupational History   Not on file  Tobacco Use   Smoking status: Former    Current packs/day: 0.00    Average packs/day: 1 pack/day for 35.0 years (35.0 ttl pk-yrs)    Types: Cigarettes    Start date: 12/21/1976    Quit date: 12/22/2011    Years since quitting: 11.4   Smokeless tobacco: Never  Vaping Use   Vaping status: Never Used  Substance and Sexual Activity   Alcohol use: Yes    Comment: occosionally    Drug use: No   Sexual activity: Not on file  Other Topics Concern   Not on file  Social History Narrative   Not on file   Social Determinants of Health   Financial Resource Strain: Low Risk  (11/28/2020)    Overall Financial Resource Strain (CARDIA)    Difficulty of Paying Living Expenses: Not hard at all  Food Insecurity: No Food Insecurity (04/22/2023)   Hunger Vital Sign    Worried About Running Out of Food in the Last Year: Never true    Ran Out of Food in the Last Year: Never true  Transportation Needs: No Transportation Needs (04/22/2023)   PRAPARE - Administrator, Civil Service (Medical): No    Lack of Transportation (Non-Medical): No  Physical Activity: Inactive (06/15/2022)   Exercise Vital Sign    Days of Exercise per Week: 0 days    Minutes of Exercise per Session: 0 min  Stress: Stress Concern Present (06/15/2022)   [employer] of Occupational Health - Occupational Stress Questionnaire    Feeling of Stress : To some extent  Social Connections: Socially Isolated (06/15/2022)   Social Connection and Isolation Panel [NHANES]    Frequency of Communication with Friends and Family: More than three times a week    Frequency of Social Gatherings with Friends and Family: More than three times a week    Attends Religious Services: Never    Database administrator or Organizations: No    Attends Banker Meetings: Never    Marital Status: Never married  Intimate Partner Violence: Not At Risk (04/22/2023)   Humiliation, Afraid, Rape, and Kick questionnaire    Fear of Current or Ex-Partner: No    Emotionally Abused: No    Physically Abused: No    Sexually Abused: No    FAMILY HISTORY: Family History  Problem Relation Age of Onset   Colon polyps Sister     ALLERGIES:  has No Known Allergies.  MEDICATIONS:  Current Outpatient Medications  Medication Sig Dispense Refill   ascorbic acid (VITAMIN C) 500 MG tablet Take 1 tablet (500 mg total) by mouth daily. 30 tablet 1   aspirin 81 MG tablet Take 81 mg by mouth daily.     atorvastatin (LIPITOR) 80 MG tablet Take 1 tablet (80 mg total) by mouth daily. 90 tablet 3   budesonide-formoterol (SYMBICORT)  160-4.5 MCG/ACT inhaler Inhale 1 puff into the lungs 2 (two) times daily. 1 each 5   Cholecalciferol (VITAMIN D) 2000 UNITS CAPS Take by mouth. VITAMIN D 3 2000 IU  ONE SOFTGEL DAILY  --OTC     clopidogrel (PLAVIX) 75 MG tablet Take  1 tablet (75 mg total) by mouth daily. 90 tablet 3   cyanocobalamin 1000 MCG tablet Take 1 tablet (1,000 mcg total) by mouth daily. 30 tablet 1   enalapril (VASOTEC) 10 MG tablet Take 1 tablet (10 mg total) by mouth daily. 90 tablet 1   ezetimibe (ZETIA) 10 MG tablet Take 1 tablet (10 mg total) by mouth daily. 90 tablet 3   iron polysaccharides (NIFEREX) 150 MG capsule Take 1 capsule (150 mg total) by mouth daily. 30 capsule 1   pantoprazole (PROTONIX) 40 MG tablet Take 1 tablet (40 mg total) by mouth 2 (two) times daily. IN AM 90 tablet 2   phenytoin (DILANTIN) 100 MG ER capsule Take 4 capsules (400 mg total) by mouth at bedtime. 360 capsule 1   potassium chloride SA (KLOR-CON M) 20 MEQ tablet Take 1 tablet (20 mEq total) by mouth daily for 7 days. 7 tablet 0   No current facility-administered medications for this visit.   Facility-Administered Medications Ordered in Other Visits  Medication Dose Route Frequency Provider Last Rate Last Admin   durvalumab (IMFINZI) 1,500 mg in sodium chloride 0.9 % 100 mL chemo infusion  1,500 mg Intravenous Once Michaelyn Barter, MD 130 mL/hr at 06/14/23 1226 1,500 mg at 06/14/23 1226    REVIEW OF SYSTEMS:   Pertinent information mentioned in HPI All other systems were reviewed with the patient and are negative.  PHYSICAL EXAMINATION: ECOG PERFORMANCE STATUS: 2 - Symptomatic, <50% confined to bed  Vitals:   06/14/23 1019  BP: 107/64  Pulse: 72  Temp: 98.9 F (37.2 C)  SpO2: 98%      Filed Weights   06/14/23 1019  Weight: 189 lb (85.7 kg)        GENERAL:alert, no distress and comfortable SKIN: skin color, texture, turgor are normal, no rashes or significant lesions EYES: normal, conjunctiva are pink and  non-injected, sclera clear OROPHARYNX:no exudate, no erythema and lips, buccal mucosa, and tongue normal  NECK: supple, thyroid normal size, non-tender, without nodularity LYMPH:  no palpable lymphadenopathy in the cervical, axillary or inguinal LUNGS: clear to auscultation and percussion with normal breathing effort HEART: regular rate & rhythm and no murmurs and no lower extremity edema ABDOMEN:abdomen soft, non-tender and normal bowel sounds Musculoskeletal:no cyanosis of digits and no clubbing  PSYCH: alert & oriented x 3 with fluent speech NEURO: no focal motor/sensory deficits  LABORATORY DATA:  I have reviewed the data as listed Lab Results  Component Value Date   WBC 3.2 (L) 06/14/2023   HGB 9.6 (L) 06/14/2023   HCT 30.3 (L) 06/14/2023   MCV 87.1 06/14/2023   PLT 414 (H) 06/14/2023   Recent Labs    04/21/23 2238 04/23/23 0514 04/29/23 0635 05/17/23 0912 06/14/23 1000  NA  --    < > 140 137 137  K  --    < > 3.5 3.8 3.3*  CL  --    < > 107 106 105  CO2  --    < > 27 25 27   GLUCOSE  --    < > 94 101* 123*  BUN  --    < > 5* 7* 10  CREATININE  --    < > 0.57* 0.62 0.63  CALCIUM  --    < > 8.3* 8.5* 8.7*  GFRNONAA  --    < > >60 >60 >60  PROT 6.5  --   --  6.8 7.0  ALBUMIN 3.2*  --   --  3.3* 3.5  AST 13*  --   --  14* 16  ALT 12  --   --  13 14  ALKPHOS 93  --   --  83 71  BILITOT 0.6  --   --  0.4 0.4  BILIDIR <0.1  --   --   --   --   IBILI NOT CALCULATED  --   --   --   --    < > = values in this interval not displayed.    RADIOGRAPHIC STUDIES: I have personally reviewed the radiological images as listed and agreed with the findings in the report. CT Chest W Contrast  Result Date: 05/26/2023 CLINICAL DATA:  Follow-up ramus cell right lung cancer EXAM: CT CHEST WITH CONTRAST TECHNIQUE: Multidetector CT imaging of the chest was performed during intravenous contrast administration. RADIATION DOSE REDUCTION: This exam was performed according to the departmental  dose-optimization program which includes automated exposure control, adjustment of the mA and/or kV according to patient size and/or use of iterative reconstruction technique. CONTRAST:  75mL OMNIPAQUE IOHEXOL 300 MG/ML  SOLN COMPARISON:  PET-CT dated 02/08/2023.  CT chest dated 01/19/2023. FINDINGS: Cardiovascular: The heart is normal in size. No pericardial effusion. Mild rightward cardiomediastinal shift. No evidence of thoracic aortic aneurysm. Atherosclerotic calcifications of the aortic arch. Mild coronary atherosclerosis of the LAD. Right chest port terminates at the cavoatrial junction. Peripheral filling defect/narrowing along the right upper lobe pulmonary artery (series 2/image 62), postprocedural. Mediastinum/Nodes: No suspicious mediastinal lymphadenopathy. 7 mm short axis high right paratracheal node (series 2/image 29), unchanged. Visualized thyroid is unremarkable. Lungs/Pleura: Volume loss with scarring/radiation changes in the posterior right upper lobe. Stable appearance of the right upper lobe bronchus. Scarring/radiation changes in the medial left upper lobe. Peribronchovascular left lower lobe nodularity is largely improved, likely infectious/inflammatory. Dominant residual 4 mm subpleural nodule anteriorly (series 3/image 84), previously 5 mm. Moderate centrilobular and paraseptal emphysematous changes. Trace right pleural effusion.  No pneumothorax. Upper Abdomen: Visualized upper abdomen is normal for stable bilateral adrenal nodularity, non FDG avid on prior PET, favoring adrenal adenomas. Musculoskeletal: Degenerative changes of the visualized thoracolumbar spine. IMPRESSION: Posttreatment changes in the right upper lobe, as above. Peribronchovascular left lower lobe nodularity is largely improved, likely infectious/inflammatory. No findings suspicious for metastatic disease in the chest. Emphysema (ICD10-J43.9). Electronically Signed   By: Charline Bills M.D.   On: 05/26/2023 13:02

## 2023-06-14 NOTE — Patient Instructions (Signed)

## 2023-06-14 NOTE — Progress Notes (Signed)
Patient had a CT scan on 05/16/2023. No new questions or concerns for the doctor today.

## 2023-06-24 DIAGNOSIS — J449 Chronic obstructive pulmonary disease, unspecified: Secondary | ICD-10-CM | POA: Diagnosis not present

## 2023-07-09 ENCOUNTER — Encounter: Payer: Self-pay | Admitting: Nurse Practitioner

## 2023-07-12 ENCOUNTER — Ambulatory Visit: Payer: 59 | Admitting: Internal Medicine

## 2023-07-12 ENCOUNTER — Other Ambulatory Visit: Payer: Self-pay

## 2023-07-12 ENCOUNTER — Ambulatory Visit: Payer: 59

## 2023-07-12 ENCOUNTER — Inpatient Hospital Stay: Payer: 59 | Attending: Internal Medicine | Admitting: Internal Medicine

## 2023-07-12 ENCOUNTER — Inpatient Hospital Stay: Payer: 59

## 2023-07-12 ENCOUNTER — Encounter: Payer: Self-pay | Admitting: Internal Medicine

## 2023-07-12 ENCOUNTER — Other Ambulatory Visit: Payer: 59

## 2023-07-12 VITALS — BP 116/85 | HR 82 | Temp 98.7°F | Ht 67.0 in | Wt 191.0 lb

## 2023-07-12 DIAGNOSIS — Z5112 Encounter for antineoplastic immunotherapy: Secondary | ICD-10-CM

## 2023-07-12 DIAGNOSIS — C3491 Malignant neoplasm of unspecified part of right bronchus or lung: Secondary | ICD-10-CM

## 2023-07-12 DIAGNOSIS — D509 Iron deficiency anemia, unspecified: Secondary | ICD-10-CM

## 2023-07-12 DIAGNOSIS — Z79620 Long term (current) use of immunosuppressive biologic: Secondary | ICD-10-CM | POA: Insufficient documentation

## 2023-07-12 DIAGNOSIS — C3490 Malignant neoplasm of unspecified part of unspecified bronchus or lung: Secondary | ICD-10-CM

## 2023-07-12 DIAGNOSIS — J440 Chronic obstructive pulmonary disease with acute lower respiratory infection: Secondary | ICD-10-CM | POA: Insufficient documentation

## 2023-07-12 DIAGNOSIS — E785 Hyperlipidemia, unspecified: Secondary | ICD-10-CM | POA: Insufficient documentation

## 2023-07-12 DIAGNOSIS — I10 Essential (primary) hypertension: Secondary | ICD-10-CM | POA: Insufficient documentation

## 2023-07-12 DIAGNOSIS — C3411 Malignant neoplasm of upper lobe, right bronchus or lung: Secondary | ICD-10-CM | POA: Insufficient documentation

## 2023-07-12 LAB — COMPREHENSIVE METABOLIC PANEL
ALT: 13 U/L (ref 0–44)
AST: 13 U/L — ABNORMAL LOW (ref 15–41)
Albumin: 3.5 g/dL (ref 3.5–5.0)
Alkaline Phosphatase: 100 U/L (ref 38–126)
Anion gap: 10 (ref 5–15)
BUN: 9 mg/dL (ref 8–23)
CO2: 26 mmol/L (ref 22–32)
Calcium: 8.9 mg/dL (ref 8.9–10.3)
Chloride: 103 mmol/L (ref 98–111)
Creatinine, Ser: 0.8 mg/dL (ref 0.61–1.24)
GFR, Estimated: >60 mL/min (ref >60)
Glucose, Bld: 96 mg/dL (ref 70–99)
Potassium: 3.6 mmol/L (ref 3.5–5.1)
Sodium: 139 mmol/L (ref 135–145)
Total Bilirubin: 0.2 mg/dL (ref <1.2)
Total Protein: 7.6 g/dL (ref 6.5–8.1)

## 2023-07-12 LAB — CBC WITH DIFFERENTIAL/PLATELET
Abs Immature Granulocytes: 0.02 10*3/uL (ref 0.00–0.07)
Basophils Absolute: 0.1 10*3/uL (ref 0.0–0.1)
Basophils Relative: 1 %
Eosinophils Absolute: 0.2 10*3/uL (ref 0.0–0.5)
Eosinophils Relative: 3 %
HCT: 32.3 % — ABNORMAL LOW (ref 39.0–52.0)
Hemoglobin: 10.4 g/dL — ABNORMAL LOW (ref 13.0–17.0)
Immature Granulocytes: 0 %
Lymphocytes Relative: 25 %
Lymphs Abs: 1.2 10*3/uL (ref 0.7–4.0)
MCH: 27.3 pg (ref 26.0–34.0)
MCHC: 32.2 g/dL (ref 30.0–36.0)
MCV: 84.8 fL (ref 80.0–100.0)
Monocytes Absolute: 0.6 10*3/uL (ref 0.1–1.0)
Monocytes Relative: 14 %
Neutro Abs: 2.6 10*3/uL (ref 1.7–7.7)
Neutrophils Relative %: 57 %
Platelets: 501 10*3/uL — ABNORMAL HIGH (ref 150–400)
RBC: 3.81 MIL/uL — ABNORMAL LOW (ref 4.22–5.81)
RDW: 16.2 % — ABNORMAL HIGH (ref 11.5–15.5)
WBC: 4.6 10*3/uL (ref 4.0–10.5)
nRBC: 0 % (ref 0.0–0.2)

## 2023-07-12 LAB — IRON AND TIBC
Iron: 30 ug/dL — ABNORMAL LOW (ref 45–182)
Saturation Ratios: 11 % — ABNORMAL LOW (ref 17.9–39.5)
TIBC: 269 ug/dL (ref 250–450)
UIBC: 239 ug/dL

## 2023-07-12 LAB — FERRITIN: Ferritin: 22 ng/mL — ABNORMAL LOW (ref 24–336)

## 2023-07-12 MED ORDER — SODIUM CHLORIDE 0.9% FLUSH
10.0000 mL | Freq: Once | INTRAVENOUS | Status: AC
  Administered 2023-07-12: 10 mL via INTRAVENOUS
  Filled 2023-07-12: qty 10

## 2023-07-12 MED ORDER — SODIUM CHLORIDE 0.9 % IV SOLN
Freq: Once | INTRAVENOUS | Status: AC
  Filled 2023-07-12: qty 250

## 2023-07-12 MED ORDER — IRON SUCROSE 20 MG/ML IV SOLN
200.0000 mg | Freq: Once | INTRAVENOUS | Status: AC
  Administered 2023-07-12: 200 mg via INTRAVENOUS
  Filled 2023-07-12: qty 10

## 2023-07-12 MED ORDER — HEPARIN SOD (PORK) LOCK FLUSH 100 UNIT/ML IV SOLN
500.0000 [IU] | Freq: Once | INTRAVENOUS | Status: AC
  Administered 2023-07-12: 500 [IU] via INTRAVENOUS
  Filled 2023-07-12: qty 5

## 2023-07-12 MED ORDER — SODIUM CHLORIDE 0.9% FLUSH
10.0000 mL | INTRAVENOUS | Status: DC | PRN
  Administered 2023-07-12: 10 mL
  Filled 2023-07-12: qty 10

## 2023-07-12 MED ORDER — SODIUM CHLORIDE 0.9 % IV SOLN
1500.0000 mg | Freq: Once | INTRAVENOUS | Status: AC
  Administered 2023-07-12: 1500 mg via INTRAVENOUS
  Filled 2023-07-12: qty 30

## 2023-07-12 MED ORDER — HEPARIN SOD (PORK) LOCK FLUSH 100 UNIT/ML IV SOLN
500.0000 [IU] | Freq: Once | INTRAVENOUS | Status: DC | PRN
Start: 2023-07-12 — End: 2023-07-12
  Filled 2023-07-12: qty 5

## 2023-07-12 NOTE — Progress Notes (Signed)
Would like to get his flu shot today.

## 2023-07-12 NOTE — Patient Instructions (Signed)
 [facility] - A DEPT OF [facility]  Discharge Instructions: Thank you for choosing [facility] to provide your oncology and hematology care.  If you have a lab appointment with the Cancer Center, please go directly to the Cancer Center and check in at the registration area.  Wear comfortable clothing and clothing appropriate for easy access to any Portacath or PICC line.   We strive to give you quality time with your provider. You may need to reschedule your appointment if you arrive late (15 or more minutes).  Arriving late affects you and other patients whose appointments are after yours.  Also, if you miss three or more appointments without notifying the office, you may be dismissed from the clinic at the provider's discretion.      For prescription refill requests, have your pharmacy contact our office and allow 72 hours for refills to be completed.    Today you received the following chemotherapy and/or immunotherapy agents Imfinzi      To help prevent nausea and vomiting after your treatment, we encourage you to take your nausea medication as directed.  BELOW ARE SYMPTOMS THAT SHOULD BE REPORTED IMMEDIATELY: *FEVER GREATER THAN 100.4 F (38 C) OR HIGHER *CHILLS OR SWEATING *NAUSEA AND VOMITING THAT IS NOT CONTROLLED WITH YOUR NAUSEA MEDICATION *UNUSUAL SHORTNESS OF BREATH *UNUSUAL BRUISING OR BLEEDING *URINARY PROBLEMS (pain or burning when urinating, or frequent urination) *BOWEL PROBLEMS (unusual diarrhea, constipation, pain near the anus) TENDERNESS IN MOUTH AND THROAT WITH OR WITHOUT PRESENCE OF ULCERS (sore throat, sores in mouth, or a toothache) UNUSUAL RASH, SWELLING OR PAIN  UNUSUAL VAGINAL DISCHARGE OR ITCHING   Items with * indicate a potential emergency and should be followed up as soon as possible or go to the Emergency Department if any problems should occur.  Please show the CHEMOTHERAPY ALERT CARD or IMMUNOTHERAPY ALERT  CARD at check-in to the Emergency Department and triage nurse.  Should you have questions after your visit or need to cancel or reschedule your appointment, please contact [facility] - A DEPT OF [facility]  [phone] and follow the prompts.  Office hours are 8:00 a.m. to 4:30 p.m. Monday - Friday. Please note that voicemails left after 4:00 p.m. may not be returned until the following business day.  We are closed weekends and major holidays. You have access to a nurse at all times for urgent questions. Please call the main number to the clinic [phone] and follow the prompts.  For any non-urgent questions, you may also contact your provider using MyChart. We now offer e-Visits for anyone 3 and older to request care online for non-urgent symptoms. For details visit [email or web address].   Also download the MyChart app! Go to the app store, search "MyChart", open the app, select [place], and log in with your MyChart username and password.

## 2023-07-12 NOTE — Progress Notes (Signed)
Cancer Center CONSULT NOTE  Patient Care Team: Derek Webb, Derek Webb as PCP - General (Physician Assistant) Derek Webb, Derek [facility] (Inactive) as Pharmacist (Pharmacist) Derek Buff, RN as Oncology Nurse Navigator Derek Barter, MD as Consulting Physician (Oncology) Derek Pax, MD as Consulting Physician (Internal Medicine)   CANCER STAGING   Cancer Staging  Squamous cell lung cancer [facility]) Staging form: Lung, AJCC 7th Edition - Clinical: Stage IIIA (T4, N0, M0) - Signed by Derek Barter, MD on 06/03/2022  CURRENT TREATMENT- Weekly Carboplatin and Taxol with RT completed on 07/27/2022.  Maintenance Durvalumab started 08/13/2022. Plan total 12 cycles.   ASSESSMENT & PLAN:  Derek Webb 72 y.o. 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  #RUL of lung SCCa, atleast Stage IIIA (cT4N0M0) #Encounter for antineoplastic immunotherapy -s/p bronchoscopy with EBUS by Dr. Jayme Webb on 05/12/2022.  -Completed 5 cycles of CarboTaxol and RT on 07/27/2022. Cycle 6 was canceled due to persistent neutropenia.    -Labs reviewed and acceptable for treatment.  Will proceed with cycle 12 of maintenance Durvalumab.  Today is the last treatment.  -CT chest from 05/16/2023 reviewed with no concern for cancer recurrence.  Now he will be on surveillance.  Per NCCN guidelines H&P with CT chest every 3 to 6 months for 3 years and then 6 to 12 months for 2 years and then annually with low-dose CT scan.  Plan to repeat CT chest in 4 months from the last scan and I will follow-up with him after that.  # Iron deficiency anemia - Admitted from 8/29 to 04/29/2023-colonoscopy with Dr. Timothy Webb showed multiple polyps, small dark clot appearing in one of the ascending diverticuli which may have been the source  of blood loss.  Upper endoscopy showed gastritis.  Pathology showed tubular adenoma.  VCE erosions in stomach that were seen on EGD.  Capsule did not reach colon.  -Proceed with IV Venofer 200 mg today.  Today is the fifth dose.  Continue with oral iron.  Check iron panel.  Hemoglobin has been improving.  # Incidental hypermetabolism in prostate -Denies any urinary symptoms.  PSA level is normal at 3.14. -Follows with Dr. Apolinar Webb  # Access - port placed on 05/28/2022   Orders Placed This Encounter  Procedures   CT Chest W Contrast    Standing Status:   Future    Standing Expiration Date:   07/11/2024    Order Specific Question:   If indicated for the ordered procedure, I authorize the administration of contrast media per Radiology protocol    Answer:   Yes    Order Specific Question:   Does the patient have a contrast media/X-ray dye allergy?    Answer:   No    Order Specific Question:   Preferred imaging location?    Answer:   [facility]   CBC with Differential (Cancer Center Only)    Standing Status:   Future    Standing Expiration Date:   07/11/2024   CMP (Cancer Center only)    Standing Status:   Future    Standing Expiration Date:   07/11/2024   Ferritin    Standing Status:   Future    Standing Expiration Date:   07/11/2024   Iron and TIBC([facility])    Standing Status:   Future    Standing  Expiration Date:   07/11/2024   RTC in 2 months for MD visit, labs, discuss CT, possible Venofer  The total time spent in the appointment was 30 minutes encounter with patients including review of chart and various tests results, discussions about plan of care and coordination of care plan   All questions were answered. The patient knows to call the clinic with any problems, questions or concerns. No barriers to learning was detected.  Derek Barter, MD 11/19/202412:25 PM   HISTORY OF PRESENTING ILLNESS:  Derek Webb 72 y.o. 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  Patient was seen today accompanied by her Sister Derek Webb.  Denies any cough, hemoptysis, shortness of breath and weight loss. Remote smoker.  Quit in 2013. Has family history of lung cancer in father.  INTERVAL HISTORY-  Patient seen today prior to cycle 11 of Durvalumab.  Accompanied with sister. Patient is doing well overall.  Denies any new concerns or symptoms.  I have reviewed his chart and materials related to his cancer extensively and collaborated history with the patient. Summary of oncologic history is as follows: Oncology History Overview Note  History of left upper lobe lung SCC status post microwave thermal ablation in 2013 by Dr. Fredia Webb.  He was deemed not a surgical candidate and had transportation issues for radiation treatment.   Squamous cell lung cancer (HCC)  01/12/2022 Initial Diagnosis   Patient has been following with Dr. Welton Webb of pulmonary for suspicious lung nodule. CT chest in March 2021 showed post ablation scarring in LUL not changed and increased atelectasis of RML and RUL. He did not follow through.   CT chest in 01/12/2022 showed  New abrupt and slightly irregular cut off of the right upper lobe bronchus with associated right upper lobe atelectasis and postobstructive changes. Findings are concerning for endobronchial malignancy    04/08/2022 PET scan   IMPRESSION: 1. Hypermetabolic central right upper lobe lung mass nearly occluding the right upper lobe bronchus, poorly delineated on the noncontrast CT images, measuring approximately 3.8 x 2.9 cm, compatible with primary bronchogenic malignancy. 2. Postobstructive pneumonia/atelectasis throughout the basilar right upper lobe. 3. No hypermetabolic metastatic thoracic adenopathy or  distant metastatic disease. 4. Stable mild post ablation change in the left upper lobe with no metabolic evidence of local tumor recurrence in this location. 5. Dilated 4.4 cm ascending thoracic aorta. Recommend annual imaging followup by CTA or MRA   05/12/2022 Procedure   Procedure was delayed due to miscommunication about holding Plavix. S/p bronchoscopy with EBUS by Dr. Jayme Webb. Op note mentions significant bulky adenopathy in the subcarinal space and in the right hilum.    05/12/2022 Pathology Results   DIAGNOSIS:  A.  LUNG, RIGHT UPPER LOBE; ENB-ASSISTED BIOPSY:  - SQUAMOUS CELL CARCINOMA.   LUNG, RIGHT UPPER LOBE; EBUS-ASSISTED BRUSHING:  - POSITIVE FOR MALIGNANCY.  - SQUAMOUS CELL CARCINOMA IS PRESENT.   LUNG, RIGHT UPPER LOBE; EBUS-ASSISTED LAVAGE:  - POSITIVE FOR MALIGNANCY.  - SQUAMOUS CELL CARCINOMA IS PRESENT  SUBCARINAL SPACE, RIGHT; EBUS-ASSISTED FNA:  - POSITIVE FOR MALIGNANCY.  - SQUAMOUS CELL CARCINOMA IS PRESENT   05/18/2022 Cancer Staging   Staging form: Lung, AJCC 7th Edition - Clinical: Stage IIIA (T4, N0, M0) - Signed by Derek Barter, MD on 06/03/2022   05/21/2022 Imaging   MRI  brain  No evidence of metastatic disease in the brain. 2. Small enhancing foci in the right parietal and right occipital calvarium, which are indeterminate but could represent small osseous metastases. Attention on follow-up   06/03/2022 - 07/27/2022 Chemotherapy   Patient is on Treatment Plan : LUNG Carboplatin + Paclitaxel + XRT q7d      08/03/2022 -  Chemotherapy   Patient is on Treatment Plan : LUNG NSCLC Durvalumab (1500) q28d       Imaging   - CT chest with contrast (11/30/2022) was reviewed at the tumor conference.  There is interval development of marked interstitial disease and patchy airspace disease more confluent consolidative opacity in the peripheral right upper lobe progressive and interval.  In the lower lung there is ill-defined tree-in-bud nodularity with  nodular areas measuring up to 1.4 cm in the posterior left lower lobe.  Features are suggestive of infectious and inflammatory etiology however disease progression cannot be excluded.  Liver lesion is stable and likely hemangioma.  Patient continues to be asymptomatic.  Cycle 6 of Durvalumab was held.    - Repeat CT chest with contrast (01/19/2023) showed interval improvement in the diffuse interstitial lung disease seen on prior study.  But there is still some persistent interstitial patchy area of consolidative airspace in the left upper lobe.  Ill-defined nodularity in the lower lobe has largely resolved although some component of dominant nodularity persist.  There is continued progression of volume loss in the right hemithorax with consolidative opacity in the right posterior apex and suprahilar right lung.  Right upper lobe airway is no longer patent.  Could be posttreatment related versus recurrent disease.   -PET/CT (02/08/2023) showed radiation changes in the right upper and medial left upper lobe.  No suspicious findings for residual or recurrent lung cancer.  Focal left prostate hypermetabolic some raising possibility of prostate cancer.  - MRI brain with no evidence of intracranial metastasis.  Unchanged subcentimeter enhancing focus in the right parietal calvarium nonspecific.     MEDICAL HISTORY:  Past Medical History:  Diagnosis Date   Alcoholism (HCC)    COPD (chronic obstructive pulmonary disease) (HCC)    SEVERE COPD -OCCASIONALLY USES OXYGEN AT NIGHT-DOES NOT USE INHALERS ON REGULAR BASIS   Elevated cholesterol    GERD (gastroesophageal reflux disease)    HOH (hard of hearing)    Hypertension    Lung cancer (HCC)    LEFT UPPER LUNG CARCINOMA-NOT A CANDIDATE FOR SURGICAL RESECTION BECAUSE OF HIS COPD AND POOR CANDIDATE FOR RADIATION BECAUSE OF TRANSPORTATION PROBLEMS   Seizures (HCC)    CHRONIC DILATIN - PT STATES NO SEIZURES IN PAST COUPLE OF YEARS; PT STATES HIS SEIZURES  CAUSED NUMBNESS OF ARMS AND HANDS AND NOT ABLE TO MOVE HIS ARMS   Stroke (HCC)    2 TO 3 YRS AGO-AFFECTED HIS SPEECH--ABLE TO AMBULATE WITHOUT ASSIST AND DOES YARD WORK.  DECREASED HEARING IN BOTH EARS SINCE STROKE.   Vitamin D deficiency     SURGICAL HISTORY: Past Surgical History:  Procedure Laterality Date   CAROTID SURGERY 2009 -LEFT     COLON POLYP EXCISION     COLON SURGERY     COLONOSCOPY WITH PROPOFOL N/A 04/18/2017   Procedure: COLONOSCOPY WITH PROPOFOL;  Surgeon: Scot Jun, MD;  Location: [facility] ENDOSCOPY;  Service: Endoscopy;  Laterality: N/A;   COLONOSCOPY WITH PROPOFOL N/A 04/26/2023   Procedure: COLONOSCOPY WITH PROPOFOL;  Surgeon: Jaynie Collins, DO;  Location: [facility] ENDOSCOPY;  Service: Gastroenterology;  Laterality:  N/A;   ESOPHAGOGASTRODUODENOSCOPY     ESOPHAGOGASTRODUODENOSCOPY (EGD) WITH PROPOFOL N/A 04/23/2023   Procedure: ESOPHAGOGASTRODUODENOSCOPY (EGD) WITH PROPOFOL;  Surgeon: Jaynie Collins, DO;  Location: [facility] ENDOSCOPY;  Service: Gastroenterology;  Laterality: N/A;   GIVENS CAPSULE STUDY  04/26/2023   Procedure: GIVENS CAPSULE STUDY;  Surgeon: Jaynie Collins, DO;  Location: [facility] ENDOSCOPY;  Service: Gastroenterology;;   IR GENERIC HISTORICAL  01/21/2016   IR RADIOLOGIST EVAL & MGMT 01/21/2016 Irish Lack, MD [facility] INTERV RAD   IR GENERIC HISTORICAL  10/22/2014   IR RADIOLOGIST EVAL & MGMT 10/22/2014 Irish Lack, MD [facility] INTERV RAD   IR IMAGING GUIDED PORT INSERTION  05/28/2022   IR RADIOLOGIST EVAL & MGMT  02/02/2017   IR RADIOLOGIST EVAL & MGMT  03/08/2018   percutaneous biopsy     POLYPECTOMY  04/26/2023   Procedure: POLYPECTOMY;  Surgeon: Jaynie Collins, DO;  Location: [facility] ENDOSCOPY;  Service: Gastroenterology;;   VIDEO BRONCHOSCOPY WITH ENDOBRONCHIAL ULTRASOUND Right 05/12/2022   Procedure: VIDEO BRONCHOSCOPY WITH ENDOBRONCHIAL ULTRASOUND;  Surgeon: Salena Saner, MD;  Location: [facility] ORS;  Service: Cardiopulmonary;   Laterality: Right;    SOCIAL HISTORY: Social History   Socioeconomic History   Marital status: Single    Spouse name: Not on file   Number of children: Not on file   Years of education: Not on file   Highest education level: Not on file  Occupational History   Not on file  Tobacco Use   Smoking status: Former    Current packs/day: 0.00    Average packs/day: 1 pack/day for 35.0 years (35.0 ttl pk-yrs)    Types: Cigarettes    Start date: 12/21/1976    Quit date: 12/22/2011    Years since quitting: 11.5   Smokeless tobacco: Never  Vaping Use   Vaping status: Never Used  Substance and Sexual Activity   Alcohol use: Yes    Comment: occosionally    Drug use: No   Sexual activity: Not on file  Other Topics Concern   Not on file  Social History Narrative   Not on file   Social Determinants of Health   Financial Resource Strain: Low Risk  (11/28/2020)   Overall Financial Resource Strain (CARDIA)    Difficulty of Paying Living Expenses: Not hard at all  Food Insecurity: No Food Insecurity (04/22/2023)   Hunger Vital Sign    Worried About Running Out of Food in the Last Year: Never true    Ran Out of Food in the Last Year: Never true  Transportation Needs: No Transportation Needs (04/22/2023)   PRAPARE - Administrator, Civil Service (Medical): No    Lack of Transportation (Non-Medical): No  Physical Activity: Inactive (06/15/2022)   Exercise Vital Sign    Days of Exercise per Week: 0 days    Minutes of Exercise per Session: 0 min  Stress: Stress Concern Present (06/15/2022)   [employer] of Occupational Health - Occupational Stress Questionnaire    Feeling of Stress : To some extent  Social Connections: Socially Isolated (06/15/2022)   Social Connection and Isolation Panel [NHANES]    Frequency of Communication with Friends and Family: More than three times a week    Frequency of Social Gatherings with Friends and Family: More than three times a week     Attends Religious Services: Never    Database administrator or Organizations: No    Attends Banker Meetings: Never    Marital Status: Never  married  Intimate Partner Violence: Not At Risk (04/22/2023)   Humiliation, Afraid, Rape, and Kick questionnaire    Fear of Current or Ex-Partner: No    Emotionally Abused: No    Physically Abused: No    Sexually Abused: No    FAMILY HISTORY: Family History  Problem Relation Age of Onset   Colon polyps Sister     ALLERGIES:  has No Known Allergies.  MEDICATIONS:  Current Outpatient Medications  Medication Sig Dispense Refill   ascorbic acid (VITAMIN C) 500 MG tablet Take 1 tablet (500 mg total) by mouth daily. 30 tablet 1   aspirin 81 MG tablet Take 81 mg by mouth daily.     atorvastatin (LIPITOR) 80 MG tablet Take 1 tablet (80 mg total) by mouth daily. 90 tablet 3   budesonide-formoterol (SYMBICORT) 160-4.5 MCG/ACT inhaler Inhale 1 puff into the lungs 2 (two) times daily. 1 each 5   Cholecalciferol (VITAMIN D) 2000 UNITS CAPS Take by mouth. VITAMIN D 3 2000 IU  ONE SOFTGEL DAILY  --OTC     clopidogrel (PLAVIX) 75 MG tablet Take 1 tablet (75 mg total) by mouth daily. 90 tablet 3   cyanocobalamin 1000 MCG tablet Take 1 tablet (1,000 mcg total) by mouth daily. 30 tablet 1   enalapril (VASOTEC) 10 MG tablet Take 1 tablet (10 mg total) by mouth daily. 90 tablet 1   ezetimibe (ZETIA) 10 MG tablet Take 1 tablet (10 mg total) by mouth daily. 90 tablet 3   iron polysaccharides (NIFEREX) 150 MG capsule Take 1 capsule (150 mg total) by mouth daily. 30 capsule 1   pantoprazole (PROTONIX) 40 MG tablet Take 1 tablet (40 mg total) by mouth 2 (two) times daily. IN AM 90 tablet 2   phenytoin (DILANTIN) 100 MG ER capsule Take 4 capsules (400 mg total) by mouth at bedtime. 360 capsule 1   potassium chloride SA (KLOR-CON M) 20 MEQ tablet Take 1 tablet (20 mEq total) by mouth daily for 7 days. 7 tablet 0   No current facility-administered  medications for this visit.   Facility-Administered Medications Ordered in Other Visits  Medication Dose Route Frequency Provider Last Rate Last Admin   durvalumab (IMFINZI) 1,500 mg in sodium chloride 0.9 % 100 mL chemo infusion  1,500 mg Intravenous Once Derek Barter, MD 130 mL/hr at 07/12/23 1210 1,500 mg at 07/12/23 1210   heparin lock flush 100 unit/mL  500 Units Intravenous Once Derek Barter, MD       heparin lock flush 100 unit/mL  500 Units Intracatheter Once PRN Derek Barter, MD       sodium chloride flush (NS) 0.9 % injection 10 mL  10 mL Intracatheter PRN Derek Barter, MD        REVIEW OF SYSTEMS:   Pertinent information mentioned in HPI All other systems were reviewed with the patient and are negative.  PHYSICAL EXAMINATION: ECOG PERFORMANCE STATUS: 2 - Symptomatic, <50% confined to bed  Vitals:   07/12/23 1031  BP: 116/85  Pulse: 82  Temp: 98.7 F (37.1 C)  SpO2: 95%      Filed Weights   07/12/23 1031  Weight: 191 lb (86.6 kg)        GENERAL:alert, no distress and comfortable SKIN: skin color, texture, turgor are normal, no rashes or significant lesions EYES: normal, conjunctiva are pink and non-injected, sclera clear OROPHARYNX:no exudate, no erythema and lips, buccal mucosa, and tongue normal  NECK: supple, thyroid normal size, non-tender, without nodularity LYMPH:  no  palpable lymphadenopathy in the cervical, axillary or inguinal LUNGS: clear to auscultation and percussion with normal breathing effort HEART: regular rate & rhythm and no murmurs and no lower extremity edema ABDOMEN:abdomen soft, non-tender and normal bowel sounds Musculoskeletal:no cyanosis of digits and no clubbing  PSYCH: alert & oriented x 3 with fluent speech NEURO: no focal motor/sensory deficits  LABORATORY DATA:  I have reviewed the data as listed Lab Results  Component Value Date   WBC 4.6 07/12/2023   HGB 10.4 (L) 07/12/2023   HCT 32.3 (L) 07/12/2023   MCV  84.8 07/12/2023   PLT 501 (H) 07/12/2023   Recent Labs    04/21/23 2238 04/23/23 0514 05/17/23 0912 06/14/23 1000 07/12/23 1023  NA  --    < > 137 137 139  K  --    < > 3.8 3.3* 3.6  CL  --    < > 106 105 103  CO2  --    < > 25 27 26   GLUCOSE  --    < > 101* 123* 96  BUN  --    < > 7* 10 9  CREATININE  --    < > 0.62 0.63 0.80  CALCIUM  --    < > 8.5* 8.7* 8.9  GFRNONAA  --    < > >60 >60 >60  PROT 6.5  --  6.8 7.0 7.6  ALBUMIN 3.2*  --  3.3* 3.5 3.5  AST 13*  --  14* 16 13*  ALT 12  --  13 14 13   ALKPHOS 93  --  83 71 100  BILITOT 0.6  --  0.4 0.4 0.2  BILIDIR <0.1  --   --   --   --   IBILI NOT CALCULATED  --   --   --   --    < > = values in this interval not displayed.    RADIOGRAPHIC STUDIES: I have personally reviewed the radiological images as listed and agreed with the findings in the report. No results found.

## 2023-07-13 LAB — T4: T4, Total: 6.8 ug/dL (ref 4.5–12.0)

## 2023-07-14 LAB — TSH: TSH: 0.74 u[IU]/mL (ref 0.350–4.500)

## 2023-07-24 DIAGNOSIS — J449 Chronic obstructive pulmonary disease, unspecified: Secondary | ICD-10-CM | POA: Diagnosis not present

## 2023-08-24 DIAGNOSIS — J449 Chronic obstructive pulmonary disease, unspecified: Secondary | ICD-10-CM | POA: Diagnosis not present

## 2023-09-07 ENCOUNTER — Ambulatory Visit
Admission: RE | Admit: 2023-09-07 | Discharge: 2023-09-07 | Disposition: A | Discharge status: Home / Self Care | Payer: 59 | Source: Ambulatory Visit | Attending: Internal Medicine | Admitting: Internal Medicine

## 2023-09-07 DIAGNOSIS — C3490 Malignant neoplasm of unspecified part of unspecified bronchus or lung: Secondary | ICD-10-CM | POA: Diagnosis not present

## 2023-09-07 DIAGNOSIS — C3491 Malignant neoplasm of unspecified part of right bronchus or lung: Secondary | ICD-10-CM | POA: Insufficient documentation

## 2023-09-07 DIAGNOSIS — I7121 Aneurysm of the ascending aorta, without rupture: Secondary | ICD-10-CM | POA: Diagnosis not present

## 2023-09-07 LAB — POCT I-STAT CREATININE: Creatinine, Ser: 0.7 mg/dL (ref 0.61–1.24)

## 2023-09-07 MED ORDER — IOHEXOL 300 MG/ML  SOLN
75.0000 mL | Freq: Once | INTRAMUSCULAR | Status: AC | PRN
  Administered 2023-09-07: 75 mL via INTRAVENOUS

## 2023-09-08 ENCOUNTER — Ambulatory Visit: Payer: 59 | Admitting: Physician Assistant

## 2023-09-13 ENCOUNTER — Encounter: Payer: Self-pay | Admitting: Internal Medicine

## 2023-09-14 ENCOUNTER — Telehealth: Payer: Self-pay | Admitting: Internal Medicine

## 2023-09-14 NOTE — Telephone Encounter (Signed)
MB full, sent mychart msg to move 12/22/23 appointment-Toni

## 2023-09-16 ENCOUNTER — Encounter: Payer: Self-pay | Admitting: Internal Medicine

## 2023-09-20 ENCOUNTER — Inpatient Hospital Stay: Payer: 59

## 2023-09-20 ENCOUNTER — Inpatient Hospital Stay: Payer: 59 | Attending: Internal Medicine | Admitting: Internal Medicine

## 2023-09-20 ENCOUNTER — Encounter: Payer: Self-pay | Admitting: Internal Medicine

## 2023-09-20 VITALS — BP 117/86 | HR 84 | Temp 98.9°F | Resp 18 | Wt 187.0 lb

## 2023-09-20 VITALS — BP 109/80 | HR 78 | Resp 18

## 2023-09-20 DIAGNOSIS — D509 Iron deficiency anemia, unspecified: Secondary | ICD-10-CM | POA: Diagnosis not present

## 2023-09-20 DIAGNOSIS — C3490 Malignant neoplasm of unspecified part of unspecified bronchus or lung: Secondary | ICD-10-CM | POA: Diagnosis not present

## 2023-09-20 DIAGNOSIS — C3411 Malignant neoplasm of upper lobe, right bronchus or lung: Secondary | ICD-10-CM | POA: Diagnosis not present

## 2023-09-20 DIAGNOSIS — C3491 Malignant neoplasm of unspecified part of right bronchus or lung: Secondary | ICD-10-CM

## 2023-09-20 LAB — IRON AND TIBC
Iron: 101 ug/dL (ref 45–182)
Saturation Ratios: 32 % (ref 17.9–39.5)
TIBC: 316 ug/dL (ref 250–450)
UIBC: 215 ug/dL

## 2023-09-20 LAB — CMP (CANCER CENTER ONLY)
ALT: 18 U/L (ref 0–44)
AST: 17 U/L (ref 15–41)
Albumin: 3.8 g/dL (ref 3.5–5.0)
Alkaline Phosphatase: 100 U/L (ref 38–126)
Anion gap: 11 (ref 5–15)
BUN: 9 mg/dL (ref 8–23)
CO2: 24 mmol/L (ref 22–32)
Calcium: 8.9 mg/dL (ref 8.9–10.3)
Chloride: 102 mmol/L (ref 98–111)
Creatinine: 0.58 mg/dL — ABNORMAL LOW (ref 0.61–1.24)
GFR, Estimated: >60 mL/min (ref >60)
Glucose, Bld: 84 mg/dL (ref 70–99)
Potassium: 3.4 mmol/L — ABNORMAL LOW (ref 3.5–5.1)
Sodium: 137 mmol/L (ref 135–145)
Total Bilirubin: 0.6 mg/dL (ref 0.0–1.2)
Total Protein: 7.6 g/dL (ref 6.5–8.1)

## 2023-09-20 LAB — FERRITIN: Ferritin: 66 ng/mL (ref 24–336)

## 2023-09-20 LAB — CBC WITH DIFFERENTIAL (CANCER CENTER ONLY)
Abs Immature Granulocytes: 0.01 10*3/uL (ref 0.00–0.07)
Basophils Absolute: 0 10*3/uL (ref 0.0–0.1)
Basophils Relative: 1 %
Eosinophils Absolute: 0.1 10*3/uL (ref 0.0–0.5)
Eosinophils Relative: 2 %
HCT: 35.7 % — ABNORMAL LOW (ref 39.0–52.0)
Hemoglobin: 11.7 g/dL — ABNORMAL LOW (ref 13.0–17.0)
Immature Granulocytes: 0 %
Lymphocytes Relative: 35 %
Lymphs Abs: 1.2 10*3/uL (ref 0.7–4.0)
MCH: 26.6 pg (ref 26.0–34.0)
MCHC: 32.8 g/dL (ref 30.0–36.0)
MCV: 81.1 fL (ref 80.0–100.0)
Monocytes Absolute: 0.5 10*3/uL (ref 0.1–1.0)
Monocytes Relative: 15 %
Neutro Abs: 1.6 10*3/uL — ABNORMAL LOW (ref 1.7–7.7)
Neutrophils Relative %: 47 %
Platelet Count: 289 10*3/uL (ref 150–400)
RBC: 4.4 MIL/uL (ref 4.22–5.81)
RDW: 17.6 % — ABNORMAL HIGH (ref 11.5–15.5)
WBC Count: 3.5 10*3/uL — ABNORMAL LOW (ref 4.0–10.5)
nRBC: 0 % (ref 0.0–0.2)

## 2023-09-20 LAB — SAMPLE TO BLOOD BANK

## 2023-09-20 IMAGING — CT CT CHEST W/O CM
2 of 4 series · 14 of 36 positions shown, 17 images · non-contrast
Comparison: Prior CT scan of the chest 02/22/2020

CLINICAL DATA: Multiple lung nodules seen on prior exam. History of
prior left upper lobe non-small cell lung cancer status post
percutaneous microwave ablation 05/05/2022.



[Series 2: chest 2.00 · axial · 0.69mm/px · z∈[-1214,-908]mm · 11 of 181 slices shown, 14 images]
[im 14/181  mediastinal]
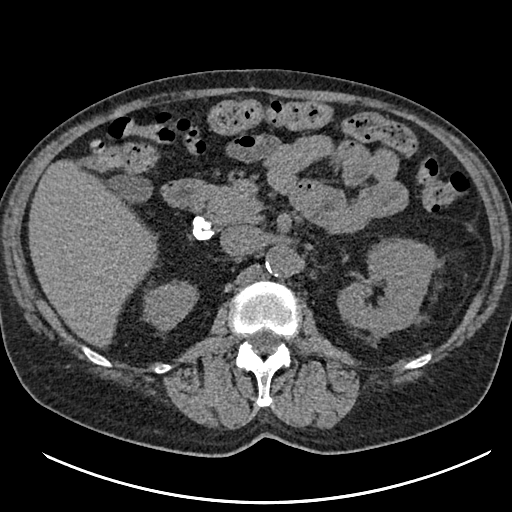
[im 14/181  lung]
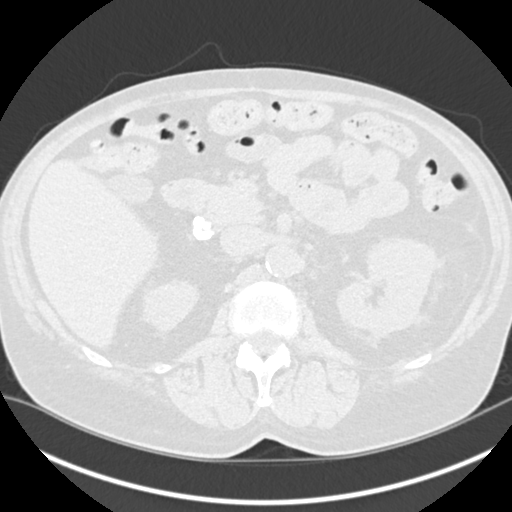
[im 28/181  lung]
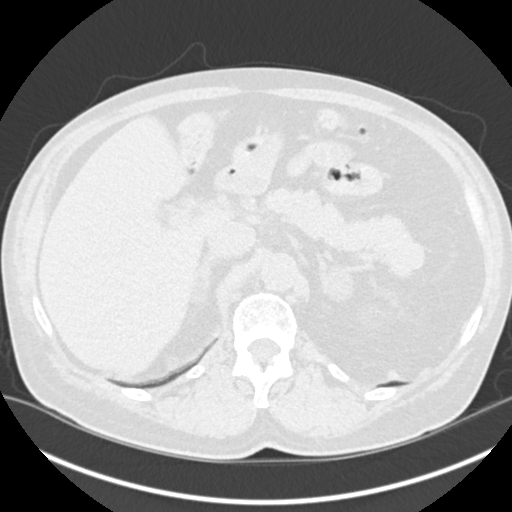
[im 42/181  lung]
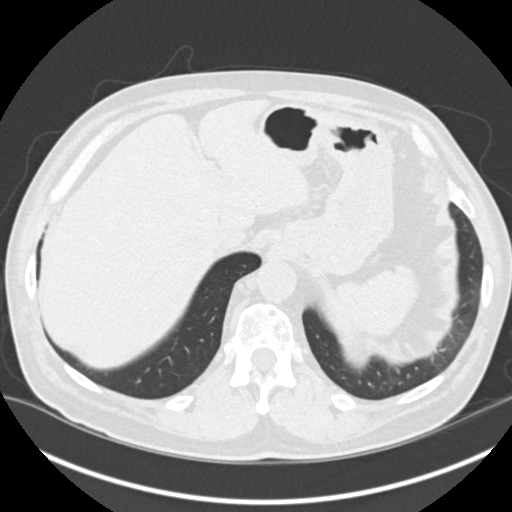
[im 56/181  lung]
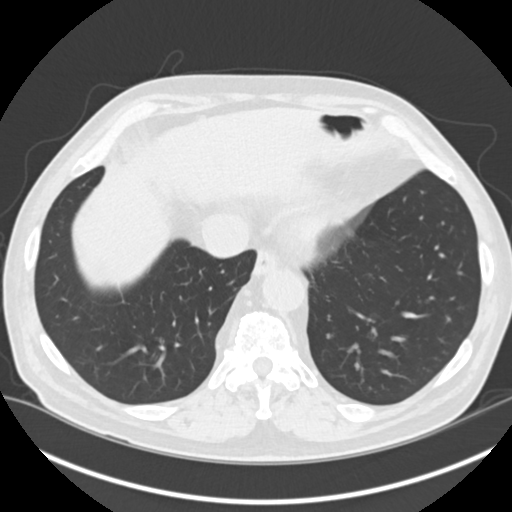
[im 70/181  mediastinal]
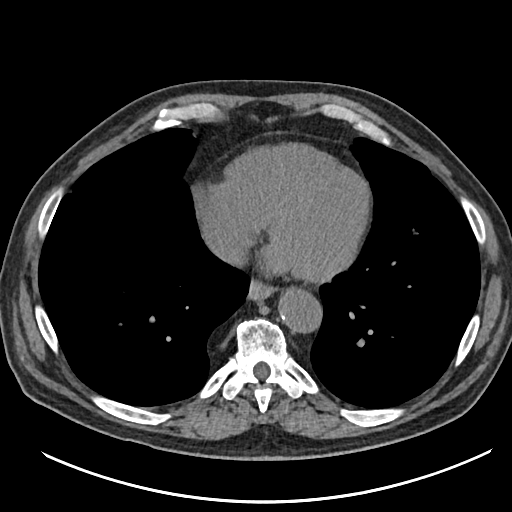
[im 70/181  lung]
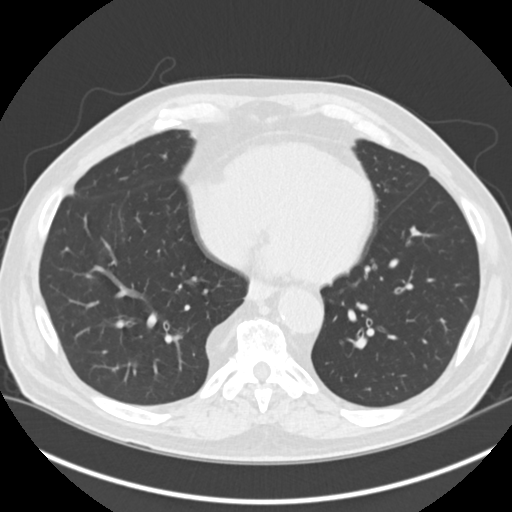
[im 97/181  lung]
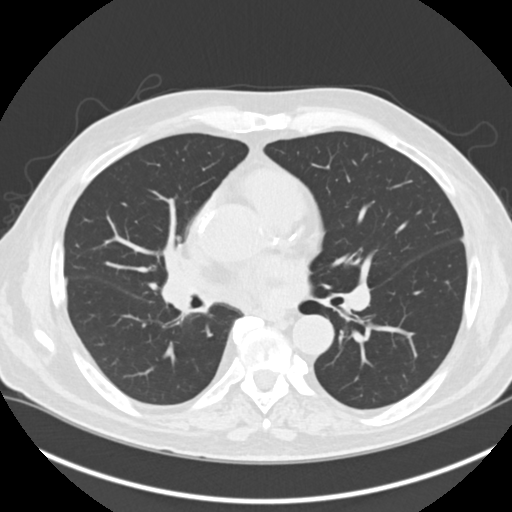
[im 111/181  lung]
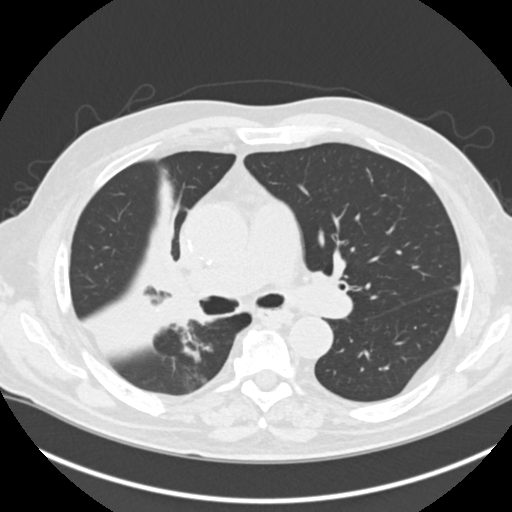
[im 125/181  lung]
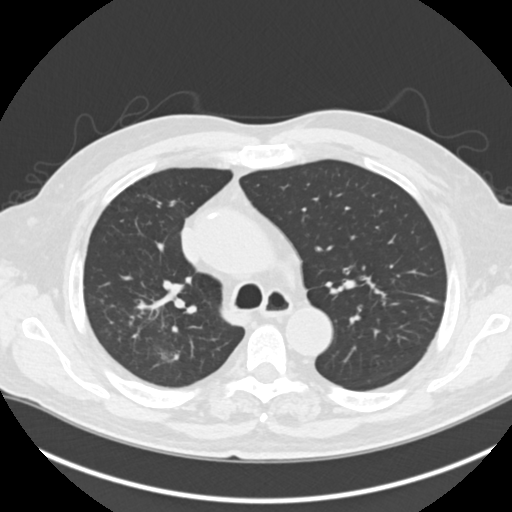
[im 139/181  mediastinal]
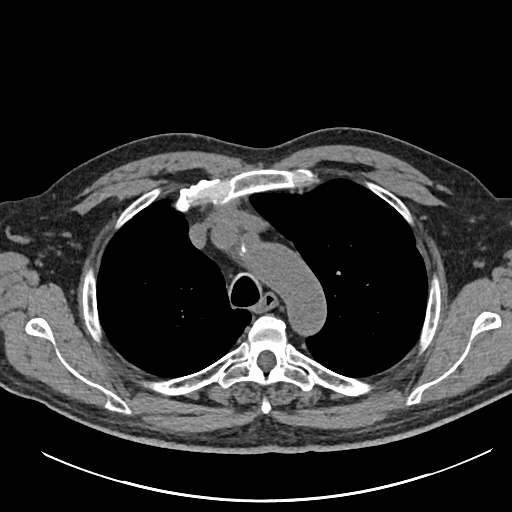
[im 139/181  lung]
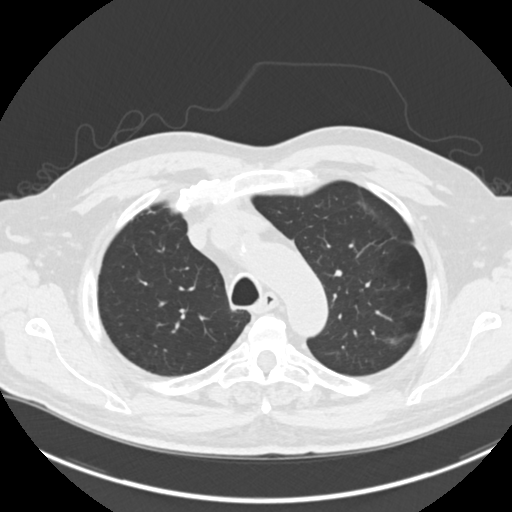
[im 153/181  lung]
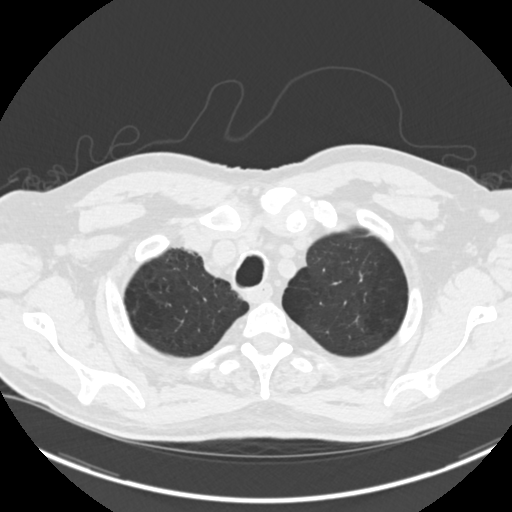
[im 167/181  lung]
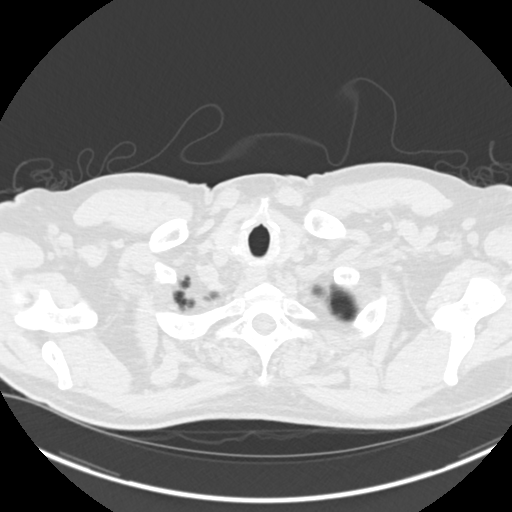

[Series 5: coronals chest 2.00 cor · coronal · 0.69mm/px · 3 of 177 slices shown]
[im 36/177  lung]
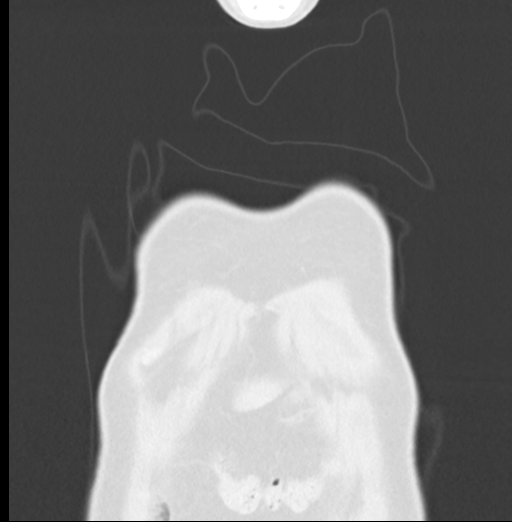
[im 71/177  lung]
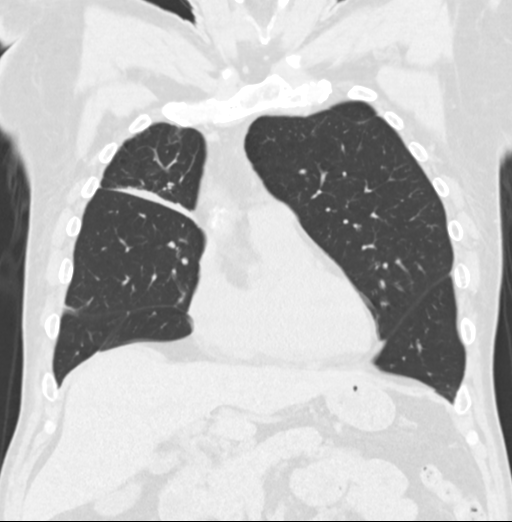
[im 106/177  lung]
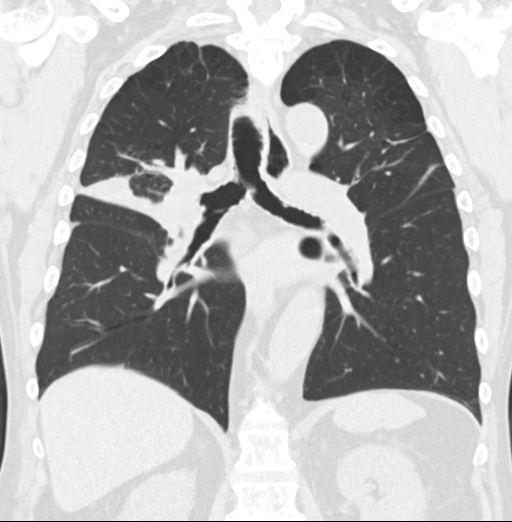

[14 of 36 positions shown; findings below may reference images not displayed]

FINDINGS: Cardiovascular: Limited evaluation in the absence of intravenous
contrast. Atherosclerotic calcifications present throughout the
aorta and branch arteries. The ascending thoracic aorta is mildly
aneurysmal at 4.2 cm in maximal diameter. The aorta tapers to normal
caliber by the isthmus. The descending thoracic aorta is normal in
caliber. The root is normal in caliber. The heart is normal in size.
No pericardial effusion. Normal caliber main pulmonary artery.
Scattered atherosclerotic plaque throughout the coronary arteries.

Mediastinum/Nodes: Unremarkable CT appearance of the thyroid gland.
No suspicious mediastinal or hilar adenopathy. No soft tissue
mediastinal mass. The thoracic esophagus is unremarkable.

Lungs/Pleura: Continued stability of left upper lobe post ablation
defect measuring approximately 2.9 x 0.9 cm. No new nodularity or
evidence of recurrent mass in this region. Background of advanced
combined centrilobular and paraseptal pulmonary emphysema. New
abrupt cut off of the right upper lobe bronchus with resultant
significant atelectasis in the right upper lobe. Patchy airspace
opacities present in a peribronchovascular distribution extending
from the hilum superiorly consistent with postobstructive changes.
This is new compared to prior imaging. Otherwise, no new pulmonary
parenchymal findings.

Upper Abdomen: Stable left adrenal adenoma. No acute abnormality
within the visualized upper abdomen. Irregular calcification in the
left upper quadrant remains unchanged.

Musculoskeletal: No chest wall mass or suspicious bone lesions
identified.
IMPRESSION: 1. New abrupt and slightly irregular cut off of the right upper lobe
bronchus with associated right upper lobe atelectasis and
postobstructive changes. Findings are concerning for endobronchial
malignancy given patient's clinical history of prior left upper lobe
non-small cell lung cancer. Recommend further evaluation with
bronchoscopy.
2. Stable appearance of left upper lobe ablation defect without
evidence of local recurrence.
3. Aortic and coronary artery atherosclerotic vascular
calcifications.
4. Advanced combined centrilobular and paraseptal pulmonary
emphysema.
5. Aneurysmal dilation of the ascending thoracic aorta at 4.2 cm,
stable. Recommend annual imaging followup by CTA or MRA. This
recommendation follows 6303
ACCF/AHA/AATS/ACR/ASA/SCA/BENRABAH/CORONA/MARTZ/NIELS Guidelines for the
Diagnosis and Management of Patients with Thoracic Aortic Disease.
Circulation. 6303; 121: [id number]. Aortic aneurysm NOS (6SE40-0P4.X)
6. Additional ancillary findings as above without significant
interval change.

Aortic aneurysm NOS (6SE40-0P4.X); aortic Atherosclerosis
(6SE40-Z0K.K) and Emphysema (6SE40-XWD.I).

## 2023-09-20 MED ORDER — IRON SUCROSE 20 MG/ML IV SOLN
200.0000 mg | Freq: Once | INTRAVENOUS | Status: AC
Start: 2023-09-20 — End: 2023-09-20
  Administered 2023-09-20: 200 mg via INTRAVENOUS
  Filled 2023-09-20: qty 10

## 2023-09-20 MED ORDER — HEPARIN SOD (PORK) LOCK FLUSH 100 UNIT/ML IV SOLN
500.0000 [IU] | Freq: Once | INTRAVENOUS | Status: AC | PRN
  Administered 2023-09-20: 500 [IU]
  Filled 2023-09-20: qty 5

## 2023-09-20 MED ORDER — SODIUM CHLORIDE 0.9% FLUSH
10.0000 mL | Freq: Once | INTRAVENOUS | Status: AC | PRN
  Administered 2023-09-20: 10 mL
  Filled 2023-09-20: qty 10

## 2023-09-20 NOTE — Progress Notes (Signed)
[facility] CONSULT NOTE  Patient Care Team: [name], Derek Webb as PCP - General (Physician Assistant) Derek Webb, [facility] (Inactive) as Pharmacist (Pharmacist) Derek Buff, RN as Oncology Nurse Navigator Derek Barter, MD as Consulting Physician (Oncology) Derek Pax, MD as Consulting Physician (Internal Medicine)   CANCER STAGING   Cancer Staging  Squamous cell lung cancer [facility]) Staging form: Lung, AJCC 7th Edition - Clinical: Stage IIIA (T4, N0, M0) - Signed by Derek Barter, MD on 06/03/2022  CURRENT TREATMENT- Weekly Carboplatin and Taxol with RT completed on 07/27/2022.  Maintenance Durvalumab started 08/13/2022. Plan total 12 cycles.   ASSESSMENT & PLAN:  Derek Webb 73 y.o. 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  #RUL of lung SCCa, atleast Stage IIIA (cT4N0M0) #Encounter for antineoplastic immunotherapy -s/p bronchoscopy with EBUS by Dr. Jayme Webb on 05/12/2022.  -Completed 5 cycles of CarboTaxol and RT on 07/27/2022. Cycle 6 was canceled due to persistent neutropenia.    -Completed adjuvant Durvalumab x 12 cycles on 07/12/2023.    -Had complete response to the treatment.  Recent CT chest with contrast from 09/07/2023 was reviewed.  No evidence of recurrence or metastatic disease.  Will schedule for repeat CT chest sometime in June 2025.  # Iron deficiency anemia - Admitted from 8/29 to 04/29/2023-colonoscopy with Dr. Timothy Webb showed multiple polyps, small dark clot appearing in one of the ascending diverticuli which may have been the source of blood loss.  Upper endoscopy showed gastritis.  Pathology showed tubular adenoma.  VCE erosions in stomach that were seen on EGD.  Capsule did not reach colon.  -Hemoglobin today 11.7.  Will proceed with 1  dose of Venofer.  Pending iron panel.  If he needs more iron infusion we will let him know.  # Incidental hypermetabolism in prostate -Denies any urinary symptoms.  PSA level is normal at 3.14. -Follows with Dr. Apolinar Webb  # Access - port placed on 05/28/2022   Orders Placed This Encounter  Procedures   CT Chest W Contrast    Standing Status:   Future    Expected Date:   02/01/2024    Expiration Date:   09/19/2024    If indicated for the ordered procedure, I authorize the administration of contrast media per Radiology protocol:   Yes    Does the patient have a contrast media/X-ray dye allergy?:   No    Preferred imaging location?:   [facility]   CBC with Differential/Platelet    Standing Status:   Future    Expected Date:   03/19/2024    Expiration Date:   09/19/2024   Comprehensive metabolic panel    Standing Status:   Future    Expected Date:   03/19/2024    Expiration Date:   09/19/2024   RTC in about 5 months for MD visit, labs.  The total time spent in the appointment was 30 minutes encounter with patients including review of chart and various tests results, discussions about plan of care and coordination of care plan   All questions were answered. The patient knows to call the clinic with any problems, questions or concerns. No barriers to learning was detected.  Derek Barter, MD 1/28/202512:16 PM   HISTORY OF PRESENTING ILLNESS:  Derek Webb 73 y.o. 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  Patient was seen today accompanied by her Sister Derek Webb.  Denies any cough, hemoptysis, shortness of breath and weight loss. Remote smoker.  Quit in 2013. Has family history of lung cancer in father.  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 He is overall feeling well. Patient denies fever, chills, nausea, vomiting, shortness of breath, cough, abdominal pain, bleeding, bowel or bladder issues. Energy level is good.  Appetite is good.  Denies any weight loss. Denies pain.  His sister has been asking somebody is calling and mentioning that patient needs to take enema for certain procedure.  Chart was reviewed unable to find any information.  She tells that it is something to do with prostate.  She was advised to call Dr. Delana Webb office for clarification.  I have reviewed his chart and materials related to his cancer extensively and collaborated history with the patient. Summary of oncologic history is as follows: Oncology History Overview Note  History of left upper lobe lung SCC status post microwave thermal ablation in 2013 by Dr. Fredia Webb.  He was deemed not a surgical candidate and had transportation issues for radiation treatment.   Squamous cell lung cancer (HCC)  01/12/2022 Initial Diagnosis   Patient has been following with Dr. Welton Webb of pulmonary for suspicious lung nodule. CT chest in March 2021 showed post ablation scarring in LUL not changed and increased atelectasis of RML and RUL. He did not follow through.   CT chest in 01/12/2022 showed  New abrupt and slightly irregular cut off of the right upper lobe bronchus with associated right upper lobe atelectasis and postobstructive changes. Findings are concerning for endobronchial malignancy    04/08/2022 PET scan   IMPRESSION: 1. Hypermetabolic central right upper lobe lung mass nearly occluding the right upper lobe bronchus, poorly delineated on the noncontrast CT images, measuring approximately 3.8 x 2.9 cm, compatible with primary bronchogenic malignancy. 2. Postobstructive pneumonia/atelectasis throughout the basilar right upper lobe. 3. No hypermetabolic metastatic thoracic adenopathy or  distant metastatic disease. 4. Stable mild post ablation change in the left upper lobe with no metabolic evidence of local tumor recurrence in this location. 5. Dilated 4.4 cm ascending thoracic aorta. Recommend annual imaging followup by CTA or MRA   05/12/2022 Procedure   Procedure was delayed due to miscommunication about holding Plavix. S/p bronchoscopy with EBUS by Dr. Jayme Webb. Op note mentions significant bulky adenopathy in the subcarinal space and in the right hilum.    05/12/2022 Pathology Results   DIAGNOSIS:  A.  LUNG, RIGHT UPPER LOBE; ENB-ASSISTED BIOPSY:  - SQUAMOUS CELL CARCINOMA.   LUNG, RIGHT UPPER LOBE; EBUS-ASSISTED BRUSHING:  - POSITIVE FOR MALIGNANCY.  - SQUAMOUS CELL CARCINOMA IS PRESENT.   LUNG, RIGHT UPPER LOBE; EBUS-ASSISTED LAVAGE:  - POSITIVE FOR MALIGNANCY.  - SQUAMOUS CELL CARCINOMA IS PRESENT  SUBCARINAL SPACE, RIGHT; EBUS-ASSISTED FNA:  - POSITIVE FOR MALIGNANCY.  - SQUAMOUS CELL CARCINOMA IS PRESENT   05/18/2022 Cancer Staging   Staging form: Lung, AJCC 7th Edition - Clinical: Stage IIIA (T4, N0, M0) - Signed by Derek Barter, MD on 06/03/2022   05/21/2022 Imaging   MRI brain  No evidence of metastatic disease in the brain. 2. Small enhancing foci in the right parietal and right  occipital calvarium, which are indeterminate but could represent small osseous metastases. Attention on follow-up   06/03/2022 - 07/27/2022 Chemotherapy   Patient is on Treatment Plan : LUNG Carboplatin + Paclitaxel + XRT q7d      08/03/2022 -  Chemotherapy   Patient is on Treatment Plan : LUNG NSCLC Durvalumab (1500) q28d       Imaging   - CT chest with contrast (11/30/2022) was reviewed at the tumor conference.  There is interval development of marked interstitial disease and patchy airspace disease more confluent consolidative opacity in the peripheral right upper lobe progressive and interval.  In the lower lung there is ill-defined tree-in-bud nodularity with  nodular areas measuring up to 1.4 cm in the posterior left lower lobe.  Features are suggestive of infectious and inflammatory etiology however disease progression cannot be excluded.  Liver lesion is stable and likely hemangioma.  Patient continues to be asymptomatic.  Cycle 6 of Durvalumab was held.    - Repeat CT chest with contrast (01/19/2023) showed interval improvement in the diffuse interstitial lung disease seen on prior study.  But there is still some persistent interstitial patchy area of consolidative airspace in the left upper lobe.  Ill-defined nodularity in the lower lobe has largely resolved although some component of dominant nodularity persist.  There is continued progression of volume loss in the right hemithorax with consolidative opacity in the right posterior apex and suprahilar right lung.  Right upper lobe airway is no longer patent.  Could be posttreatment related versus recurrent disease.   -PET/CT (02/08/2023) showed radiation changes in the right upper and medial left upper lobe.  No suspicious findings for residual or recurrent lung cancer.  Focal left prostate hypermetabolic some raising possibility of prostate cancer.  - MRI brain with no evidence of intracranial metastasis.  Unchanged subcentimeter enhancing focus in the right parietal calvarium nonspecific.     MEDICAL HISTORY:  Past Medical History:  Diagnosis Date   Alcoholism (HCC)    COPD (chronic obstructive pulmonary disease) (HCC)    SEVERE COPD -OCCASIONALLY USES OXYGEN AT NIGHT-DOES NOT USE INHALERS ON REGULAR BASIS   Elevated cholesterol    GERD (gastroesophageal reflux disease)    HOH (hard of hearing)    Hypertension    Lung cancer (HCC)    LEFT UPPER LUNG CARCINOMA-NOT A CANDIDATE FOR SURGICAL RESECTION BECAUSE OF HIS COPD AND POOR CANDIDATE FOR RADIATION BECAUSE OF TRANSPORTATION PROBLEMS   Seizures (HCC)    CHRONIC DILATIN - PT STATES NO SEIZURES IN PAST COUPLE OF YEARS; PT STATES HIS SEIZURES  CAUSED NUMBNESS OF ARMS AND HANDS AND NOT ABLE TO MOVE HIS ARMS   Stroke (HCC)    2 TO 3 YRS AGO-AFFECTED HIS SPEECH--ABLE TO AMBULATE WITHOUT ASSIST AND DOES YARD WORK.  DECREASED HEARING IN BOTH EARS SINCE STROKE.   Vitamin D deficiency     SURGICAL HISTORY: Past Surgical History:  Procedure Laterality Date   CAROTID SURGERY 2009 -LEFT     COLON POLYP EXCISION     COLON SURGERY     COLONOSCOPY WITH PROPOFOL N/A 04/18/2017   Procedure: COLONOSCOPY WITH PROPOFOL;  Surgeon: Scot Jun, MD;  Location[facility] END[facility]COPY;  Service: Endoscopy;  Laterality: N/A;   COLONOSCOPY WITH PROPOFOL N/A 04/26/2023   Procedure: COLONOSCOPY WITH PROPOFOL;  Surgeon: Jaynie Collins, DO;  Location: [facility] ENDOSCOPY;  Service: Gastroenterology;  Laterality: N/A;   ESOPHAGOGASTRODUODENOSCOPY     ESOPHAGOGASTRODUODENOSCOPY (EGD) WITH PROPOFOL N/A 04/23/2023   Procedure: ESOPHAGOGASTRODUODENOSCOPY (EGD) WITH  PROPOFOL;  Surgeon: Jaynie Collins, DO;  Location: [facility] ENDOSCOPY;  Service: Gastroenterology;  Laterality: N/A;   GIVENS CAPSULE STUDY  04/26/2023   Procedure: GIVENS CAPSULE STUDY;  Surgeon: Jaynie Collins, DO;  Location: [facility] ENDOSCOPY;  Service: Gastroenterology;;   IR GENERIC HISTORICAL  01/21/2016   IR RADIOLOGIST EVAL & MGMT 01/21/2016 Irish Lack, MD [facility] INTERV RAD   IR GENERIC HISTORICAL  10/22/2014   IR RADIOLOGIST EVAL & MGMT 10/22/2014 Irish Lack, MD [facility] INTERV RAD   IR IMAGING GUIDED PORT INSERTION  05/28/2022   IR RADIOLOGIST EVAL & MGMT  02/02/2017   IR RADIOLOGIST EVAL & MGMT  03/08/2018   percutaneous biopsy     POLYPECTOMY  04/26/2023   Procedure: POLYPECTOMY;  Surgeon: Jaynie Collins, DO;  Location: [facility] ENDOSCOPY;  Service: Gastroenterology;;   VIDEO BRONCHOSCOPY WITH ENDOBRONCHIAL ULTRASOUND Right 05/12/2022   Procedure: VIDEO BRONCHOSCOPY WITH ENDOBRONCHIAL ULTRASOUND;  Surgeon: Salena Saner, MD;  Location: [facility] ORS;  Service: Cardiopulmonary;   Laterality: Right;    SOCIAL HISTORY: Social History   Socioeconomic History   Marital status: Single    Spouse name: Not on file   Number of children: Not on file   Years of education: Not on file   Highest education level: Not on file  Occupational History   Not on file  Tobacco Use   Smoking status: Former    Current packs/day: 0.00    Average packs/day: 1 pack/day for 35.0 years (35.0 ttl pk-yrs)    Types: Cigarettes    Start date: 12/21/1976    Quit date: 12/22/2011    Years since quitting: 11.7   Smokeless tobacco: Never  Vaping Use   Vaping status: Never Used  Substance and Sexual Activity   Alcohol use: Yes    Comment: occosionally    Drug use: No   Sexual activity: Not on file  Other Topics Concern   Not on file  Social History Narrative   Not on file   Social Drivers of Health   Financial Resource Strain: Low Risk  (09/19/2023)   Overall Financial Resource Strain (CARDIA)    Difficulty of Paying Living Expenses: Not hard at all  Food Insecurity: No Food Insecurity (09/19/2023)   Hunger Vital Sign    Worried About Running Out of Food in the Last Year: Never true    Ran Out of Food in the Last Year: Never true  Transportation Needs: No Transportation Needs (09/19/2023)   PRAPARE - Administrator, Civil Service (Medical): No    Lack of Transportation (Non-Medical): No  Physical Activity: Unknown (09/19/2023)   Exercise Vital Sign    Days of Exercise per Week: 3 days    Minutes of Exercise per Session: Patient declined  Stress: No Stress Concern Present (09/19/2023)   [employer] of Occupational Health - Occupational Stress Questionnaire    Feeling of Stress : Not at all  Social Connections: Unknown (09/19/2023)   Social Connection and Isolation Panel [NHANES]    Frequency of Communication with Friends and Family: More than three times a week    Frequency of Social Gatherings with Friends and Family: More than three times a week    Attends  Religious Services: Patient declined    Database administrator or Organizations: No    Attends Banker Meetings: Not on file    Marital Status: Never married  Intimate Partner Violence: Not At Risk (04/22/2023)   Humiliation, Afraid, Rape, and Kick questionnaire  Fear of Current or Ex-Partner: No    Emotionally Abused: No    Physically Abused: No    Sexually Abused: No    FAMILY HISTORY: Family History  Problem Relation Age of Onset   Colon polyps Sister     ALLERGIES:  has no known allergies.  MEDICATIONS:  Current Outpatient Medications  Medication Sig Dispense Refill   ascorbic acid (VITAMIN C) 500 MG tablet Take 1 tablet (500 mg total) by mouth daily. 30 tablet 1   aspirin 81 MG tablet Take 81 mg by mouth daily.     atorvastatin (LIPITOR) 80 MG tablet Take 1 tablet (80 mg total) by mouth daily. 90 tablet 3   budesonide-formoterol (SYMBICORT) 160-4.5 MCG/ACT inhaler Inhale 1 puff into the lungs 2 (two) times daily. 1 each 5   Cholecalciferol (VITAMIN D) 2000 UNITS CAPS Take by mouth. VITAMIN D 3 2000 IU  ONE SOFTGEL DAILY  --OTC     clopidogrel (PLAVIX) 75 MG tablet Take 1 tablet (75 mg total) by mouth daily. 90 tablet 3   cyanocobalamin 1000 MCG tablet Take 1 tablet (1,000 mcg total) by mouth daily. 30 tablet 1   enalapril (VASOTEC) 10 MG tablet Take 1 tablet (10 mg total) by mouth daily. 90 tablet 1   ezetimibe (ZETIA) 10 MG tablet Take 1 tablet (10 mg total) by mouth daily. 90 tablet 3   iron polysaccharides (NIFEREX) 150 MG capsule Take 1 capsule (150 mg total) by mouth daily. 30 capsule 1   pantoprazole (PROTONIX) 40 MG tablet Take 1 tablet (40 mg total) by mouth 2 (two) times daily. IN AM 90 tablet 2   phenytoin (DILANTIN) 100 MG ER capsule Take 4 capsules (400 mg total) by mouth at bedtime. 360 capsule 1   potassium chloride SA (KLOR-CON M) 20 MEQ tablet Take 1 tablet (20 mEq total) by mouth daily for 7 days. 7 tablet 0   No current facility-administered  medications for this visit.    REVIEW OF SYSTEMS:   Pertinent information mentioned in HPI All other systems were reviewed with the patient and are negative.  PHYSICAL EXAMINATION: ECOG PERFORMANCE STATUS: 2 - Symptomatic, <50% confined to bed  Vitals:   09/20/23 1044  BP: 117/86  Pulse: 84  Resp: 18  Temp: 98.9 F (37.2 C)  SpO2: 100%      Filed Weights   09/20/23 1044  Weight: 187 lb (84.8 kg)        GENERAL:alert, no distress and comfortable SKIN: skin color, texture, turgor are normal, no rashes or significant lesions EYES: normal, conjunctiva are pink and non-injected, sclera clear OROPHARYNX:no exudate, no erythema and lips, buccal mucosa, and tongue normal  NECK: supple, thyroid normal size, non-tender, without nodularity LYMPH:  no palpable lymphadenopathy in the cervical, axillary or inguinal LUNGS: clear to auscultation and percussion with normal breathing effort HEART: regular rate & rhythm and no murmurs and no lower extremity edema ABDOMEN:abdomen soft, non-tender and normal bowel sounds Musculoskeletal:no cyanosis of digits and no clubbing  PSYCH: alert & oriented x 3 with fluent speech NEURO: no focal motor/sensory deficits  LABORATORY DATA:  I have reviewed the data as listed Lab Results  Component Value Date   WBC 3.5 (L) 09/20/2023   HGB 11.7 (L) 09/20/2023   HCT 35.7 (L) 09/20/2023   MCV 81.1 09/20/2023   PLT 289 09/20/2023   Recent Labs    04/21/23 2238 04/23/23 0514 06/14/23 1000 07/12/23 1023 09/07/23 1100 09/20/23 1013  NA  --    < >  137 139  --  137  K  --    < > 3.3* 3.6  --  3.4*  CL  --    < > 105 103  --  102  CO2  --    < > 27 26  --  24  GLUCOSE  --    < > 123* 96  --  84  BUN  --    < > 10 9  --  9  CREATININE  --    < > 0.63 0.80 0.70 0.58*  CALCIUM  --    < > 8.7* 8.9  --  8.9  GFRNONAA  --    < > >60 >60  --  >60  PROT 6.5   < > 7.0 7.6  --  7.6  ALBUMIN 3.2*   < > 3.5 3.5  --  3.8  AST 13*   < > 16 13*  --  17   ALT 12   < > 14 13  --  18  ALKPHOS 93   < > 71 100  --  100  BILITOT 0.6   < > 0.4 0.2  --  0.6  BILIDIR <0.1  --   --   --   --   --   IBILI NOT CALCULATED  --   --   --   --   --    < > = values in this interval not displayed.    RADIOGRAPHIC STUDIES: I have personally reviewed the radiological images as listed and agreed with the findings in the report. CT Chest W Contrast Result Date: 09/16/2023 CLINICAL DATA:  Non-small cell lung cancer.  * Tracking Code: BO * EXAM: CT CHEST WITH CONTRAST TECHNIQUE: Multidetector CT imaging of the chest was performed during intravenous contrast administration. RADIATION DOSE REDUCTION: This exam was performed according to the departmental dose-optimization program which includes automated exposure control, adjustment of the mA and/or kV according to patient size and/or use of iterative reconstruction technique. CONTRAST:  75mL OMNIPAQUE IOHEXOL 300 MG/ML  SOLN COMPARISON:  05/16/2023 and PET 02/08/2023. FINDINGS: Cardiovascular: Right IJ Port-A-Cath terminates at the SVC RA junction. Atherosclerotic calcification of the aorta, aortic valve and coronary arteries. Enlarged pulmonic trunk. Heart is at the upper limits of normal in size to mildly enlarged. No pericardial effusion. Ascending aorta measures 4.1 cm (coronal image 57), stable. Mediastinum/Nodes: Soft tissue thickening in the right hilum with associated narrowing of the distal right main pulmonary artery, unchanged. Otherwise, No pathologically enlarged mediastinal, hilar or axillary lymph nodes. Esophagus is grossly unremarkable. Lungs/Pleura: Post treatment consolidation and volume loss in the right upper lobe and perihilar right lower lobe, as before. Centrilobular and paraseptal emphysema. Additional scarring/additional linear scarring, architectural distortion and volume loss in the left upper lobe. No suspicious pulmonary nodules. No pleural fluid. Airway is otherwise unremarkable. Upper Abdomen:  Hyperdense lesion in the left hepatic lobe measures 2.0 cm and is present dating back to at least 01/26/2017, compatible with a hemangioma. Blush of hyperattenuation in the peripheral inferior right hepatic lobe (2/151), possibly a second hemangioma or perfusion anomaly. Probable tiny left hepatic lobe cysts. Slight nodular thickening of the right adrenal gland. Left adrenal nodule measures 2.0 cm and was fluid in density on PET 02/08/2023. No specific follow-up necessary. Small low-attenuation lesion in the left kidney, too small to characterize. No specific follow-up necessary. Probable small splenic cyst. Calcified periportal lymph node. Visualized portions of the liver, gallbladder, adrenal glands, kidneys, spleen, pancreas,  stomach and bowel are otherwise grossly unremarkable. No upper abdominal adenopathy. Musculoskeletal: Degenerative changes in the spine. No osteopenia. Old rib fractures. No worrisome lytic or sclerotic lesions. IMPRESSION: 1. Post treatment scarring in the right hemithorax. No evidence of recurrent or metastatic disease. 2. 4.1 cm ascending aortic aneurysm, stable. Recommend annual imaging followup by CTA or MRA. This recommendation follows 2010 ACCF/AHA/AATS/ACR/ASA/SCA/SCAI/SIR/STS/SVM Guidelines for the Diagnosis and Management of Patients with Thoracic Aortic Disease. Circulation. 2010; 121: G295-M841. Aortic aneurysm NOS (ICD10-I71.9). 3. Stable left adrenal nodule, likely an adenoma. 4. Aortic atherosclerosis (ICD10-I70.0). Coronary artery calcification. 5. Enlarged pulmonic trunk, indicative of pulmonary arterial hypertension. 6.  Emphysema (ICD10-J43.9). Electronically Signed   By: Leanna Battles M.D.   On: 09/16/2023 10:22

## 2023-09-20 NOTE — Progress Notes (Signed)
Patient had a CT scan on 09/07/2023. He is doing well no new questions or concerns for the doctor today.

## 2023-09-21 ENCOUNTER — Other Ambulatory Visit: Payer: Self-pay

## 2023-09-23 ENCOUNTER — Ambulatory Visit: Payer: 59 | Admitting: Pediatrics

## 2023-09-23 VITALS — BP 109/73 | Temp 98.6°F | Resp 16 | Ht 68.5 in | Wt 189.8 lb

## 2023-09-23 DIAGNOSIS — J4489 Other specified chronic obstructive pulmonary disease: Secondary | ICD-10-CM | POA: Diagnosis not present

## 2023-09-23 DIAGNOSIS — Z23 Encounter for immunization: Secondary | ICD-10-CM | POA: Diagnosis not present

## 2023-09-23 DIAGNOSIS — Z7689 Persons encountering health services in other specified circumstances: Secondary | ICD-10-CM

## 2023-09-23 DIAGNOSIS — C3490 Malignant neoplasm of unspecified part of unspecified bronchus or lung: Secondary | ICD-10-CM | POA: Diagnosis not present

## 2023-09-23 DIAGNOSIS — Z1330 Encounter for screening examination for mental health and behavioral disorders, unspecified: Secondary | ICD-10-CM | POA: Diagnosis not present

## 2023-09-23 DIAGNOSIS — H9190 Unspecified hearing loss, unspecified ear: Secondary | ICD-10-CM | POA: Diagnosis not present

## 2023-09-23 DIAGNOSIS — R569 Unspecified convulsions: Secondary | ICD-10-CM | POA: Diagnosis not present

## 2023-09-23 NOTE — Patient Instructions (Addendum)
 Good to meet you! Welcome to [facility]!  As your primary care doctor, I look forward to working with you to help you reach your health goals.  Please be aware of a couple of logistical items: - If you message me on mychart, it may take me 1-2 business days to get back to you. This is for non-urgent messaging.  - If you require urgent clinical attention, please call the clinic or present to urgent care/emergency room - If you have labs, I typically will send a message about them in 1-2 business days. - I am not here on Mondays, otherwise will be available from Tuesday-Friday during 8a-5pm.

## 2023-09-23 NOTE — Progress Notes (Unsigned)
Establish Care Note  BP 109/73 (BP Location: Right Arm, Patient Position: Sitting, Cuff Size: Normal)   Temp 98.6 F (37 C) (Oral)   Resp 16   Ht 5' 8.5" (1.74 m)   Wt 189 lb 12.8 oz (86.1 kg)   SpO2 98%   BMI 28.44 kg/m    Subjective:    Patient ID: Derek Webb, male    DOB: 13-Dec-1950, 73 y.o.   MRN: [id number]  HPI: TORRIAN CANION is a 73 y.o. male  Chief Complaint  Patient presents with  . Establish Care    No concerns     Establishing care, the following was discussed today:  Discussed the use of AI scribe software for clinical note transcription with the patient, who gave verbal consent to proceed.  History of Present Illness   The patient, with COPD, seizures, and hyperlipidemia, presents for medication refills and management of chronic conditions. He is accompanied by his sister, [name].  He has a history of chronic obstructive pulmonary disease (COPD) and uses Symbicort daily to manage his symptoms. His breathing is currently 'all right' with no acute respiratory concerns. There is a mention of a family member, Jasmine December, in relation to COPD, but it was clarified that he himself has COPD.  He has a history of seizures and takes phenytoin, also known as Dilantin, at a dose of 400 mg at night, consisting of four pills of 100 mg each. He has not experienced any recent seizures and feels 'okay'.  He is on atorvastatin 80 mg and Zetia for hyperlipidemia, taking these medications daily without issues.  He takes pantoprazole twice daily for acid reflux and refers to it as the 'P word' medication he uses.  He is on a blood pressure medication at a dose of 10 mg, and his blood pressure is reportedly well-controlled.  There is a past history of lung cancer, which has been stable for some time.      Current Outpatient Medications on File Prior to Visit  Medication Sig Dispense Refill  . ascorbic acid (VITAMIN C) 500 MG tablet Take 1 tablet (500 mg total) by mouth  daily. 30 tablet 1  . aspirin 81 MG tablet Take 81 mg by mouth daily.    Marland Kitchen atorvastatin (LIPITOR) 80 MG tablet Take 1 tablet (80 mg total) by mouth daily. 90 tablet 3  . budesonide-formoterol (SYMBICORT) 160-4.5 MCG/ACT inhaler Inhale 1 puff into the lungs 2 (two) times daily. 1 each 5  . Cholecalciferol (VITAMIN D) 2000 UNITS CAPS Take by mouth. VITAMIN D 3 2000 IU  ONE SOFTGEL DAILY  --OTC    . clopidogrel (PLAVIX) 75 MG tablet Take 1 tablet (75 mg total) by mouth daily. 90 tablet 3  . cyanocobalamin 1000 MCG tablet Take 1 tablet (1,000 mcg total) by mouth daily. 30 tablet 1  . enalapril (VASOTEC) 10 MG tablet Take 1 tablet (10 mg total) by mouth daily. 90 tablet 1  . ezetimibe (ZETIA) 10 MG tablet Take 1 tablet (10 mg total) by mouth daily. 90 tablet 3  . iron polysaccharides (NIFEREX) 150 MG capsule Take 1 capsule (150 mg total) by mouth daily. 30 capsule 1  . pantoprazole (PROTONIX) 40 MG tablet Take 1 tablet (40 mg total) by mouth 2 (two) times daily. IN AM 90 tablet 2  . phenytoin (DILANTIN) 100 MG ER capsule Take 4 capsules (400 mg total) by mouth at bedtime. 360 capsule 1  . potassium chloride SA (KLOR-CON M) 20 MEQ tablet Take  1 tablet (20 mEq total) by mouth daily for 7 days. 7 tablet 0   No current facility-administered medications on file prior to visit.    #HM Will review HM records and updated as needed. Immunizations: {immunizationsmps:31081} Colon cancer screen: {mpsduenotdue:31082} ***Lung cancer screen: {mpsduenotdue:31082} ***Breast cancer screen: {mpsduenotdue:31082} ***Cervical cancer screen: {mpsduenotdue:31082} ***Prostrate cancer screen: {mpsduenotdue:31082}  Relevant past medical, surgical, family and social history reviewed and updated as indicated. Interim medical history since our last visit reviewed. Allergies and medications reviewed and updated.  ROS per HPI unless specifically indicated above     Objective:    BP 109/73 (BP Location: Right Arm,  Patient Position: Sitting, Cuff Size: Normal)   Temp 98.6 F (37 C) (Oral)   Resp 16   Ht 5' 8.5" (1.74 m)   Wt 189 lb 12.8 oz (86.1 kg)   SpO2 98%   BMI 28.44 kg/m   Wt Readings from Last 3 Encounters:  09/23/23 189 lb 12.8 oz (86.1 kg)  09/20/23 187 lb (84.8 kg)  07/12/23 191 lb (86.6 kg)     Physical Exam      09/23/2023   10:44 AM 03/03/2023   10:37 AM 09/02/2022   10:26 AM 06/15/2022    2:54 PM 03/01/2022   11:27 AM  Depression screen PHQ 2/9  Decreased Interest 0 0 0 1 0  Down, Depressed, Hopeless 0 0 0 1 0  PHQ - 2 Score 0 0 0 2 0  Altered sleeping 0   0   Tired, decreased energy 0   1   Change in appetite 0   0   Feeling bad or failure about yourself  0   0   Trouble concentrating 0   0   Moving slowly or fidgety/restless 0   0   Suicidal thoughts 0   0   PHQ-9 Score 0   3   Difficult doing work/chores    Somewhat difficult         09/23/2023   10:45 AM  GAD 7 : Generalized Anxiety Score  Nervous, Anxious, on Edge 0  Control/stop worrying 0  Worry too much - different things 0  Trouble relaxing 0  Restless 0  Easily annoyed or irritable 0  Afraid - awful might happen 0  Total GAD 7 Score 0       Assessment & Plan:  Assessment & Plan   Encounter for behavioral health screening  Immunization due -     Flu Vaccine Trivalent High Dose (Fluad) -     Pfizer Comirnaty Covid-19 Vaccine 74yrs & older    Assessment and Plan    Hearing Loss Discussed the process of obtaining new hearing aids through [employer] or [employer]. -Continue current plan and notify if any issues arise.  Lung Cancer History of lung cancer, currently stable. -Continue current management and monitoring.  COPD Patient has COPD, not a family member as previously noted in chart. -Corrected chart to reflect accurate family history.  Medication Management Multiple medications with some nearing refill time. Discussed the possibility of transitioning to a pill pack for  convenience. -Refill medications as needed. -Explore the option of a pill pack with the pharmacy.  Seizure Disorder Patient on Phenytoin (Dilantin) 400mg  nightly for history of seizures. Discussed the possibility of finding a higher dose pill to reduce the number of pills taken. -Continue Phenytoin 400mg  nightly. -Investigate the possibility of a higher dose pill to reduce pill burden.  Blood Pressure Monitoring Patient has been monitoring blood  pressure at home. -Continue home monitoring and bring records to next appointment.  Pending Lab Work Discussed pending lab work that was not completed. -Bring lab work order to clinic for processing and completion.  Follow-up Plan to follow-up in three months for a physical and to assess current management. -Schedule follow-up appointment in three months.           Follow up plan: Return in about 3 months (around 12/21/2023) for AWV, Physical.  Janeliz Prestwood Howell Pringle, MD

## 2023-09-24 DIAGNOSIS — J449 Chronic obstructive pulmonary disease, unspecified: Secondary | ICD-10-CM | POA: Diagnosis not present

## 2023-09-29 ENCOUNTER — Telehealth: Payer: Self-pay | Admitting: Pediatrics

## 2023-09-29 ENCOUNTER — Encounter: Payer: Self-pay | Admitting: Pediatrics

## 2023-09-29 NOTE — Assessment & Plan Note (Signed)
 History of lung cancer, currently stable.  -Continue current management and monitoring.

## 2023-09-29 NOTE — Assessment & Plan Note (Signed)
 Discussed the process of obtaining new hearing aids through [employer] or [employer]. -Continue current plan and notify if any issues arise.

## 2023-09-29 NOTE — Telephone Encounter (Signed)
 Patient sister, Navy Belay brought in a lab order to give to provider for review. I am placing in providers folder.

## 2023-09-29 NOTE — Assessment & Plan Note (Signed)
 Patient has COPD, not a family member as previously noted in chart. -Corrected chart to reflect accurate family history.

## 2023-09-29 NOTE — Assessment & Plan Note (Signed)
 Patient on Phenytoin  (Dilantin ) 400mg  nightly for history of seizures. Discussed the possibility of finding a higher dose pill to reduce the number of pills taken. -Continue Phenytoin  400mg  nightly. -Investigate the possibility of a higher dose pill to reduce pill burden.

## 2023-10-06 ENCOUNTER — Ambulatory Visit: Payer: 59 | Admitting: Physician Assistant

## 2023-10-22 DIAGNOSIS — J449 Chronic obstructive pulmonary disease, unspecified: Secondary | ICD-10-CM | POA: Diagnosis not present

## 2023-10-24 ENCOUNTER — Encounter: Payer: Self-pay | Admitting: Internal Medicine

## 2023-11-07 ENCOUNTER — Other Ambulatory Visit: Payer: Self-pay

## 2023-11-16 ENCOUNTER — Ambulatory Visit
Admission: RE | Admit: 2023-11-16 | Discharge: 2023-11-16 | Disposition: A | Discharge status: Home / Self Care | Payer: 59 | Source: Ambulatory Visit | Attending: Radiation Oncology | Admitting: Radiation Oncology

## 2023-11-16 ENCOUNTER — Encounter: Payer: Self-pay | Admitting: Radiation Oncology

## 2023-11-16 VITALS — BP 82/68 | HR 89 | Temp 98.7°F | Resp 14 | Wt 185.0 lb

## 2023-11-16 DIAGNOSIS — C3411 Malignant neoplasm of upper lobe, right bronchus or lung: Secondary | ICD-10-CM | POA: Diagnosis not present

## 2023-11-16 DIAGNOSIS — Z9221 Personal history of antineoplastic chemotherapy: Secondary | ICD-10-CM | POA: Diagnosis not present

## 2023-11-16 DIAGNOSIS — Z923 Personal history of irradiation: Secondary | ICD-10-CM | POA: Insufficient documentation

## 2023-11-16 DIAGNOSIS — C3490 Malignant neoplasm of unspecified part of unspecified bronchus or lung: Secondary | ICD-10-CM

## 2023-11-16 DIAGNOSIS — I7121 Aneurysm of the ascending aorta, without rupture: Secondary | ICD-10-CM | POA: Insufficient documentation

## 2023-11-16 DIAGNOSIS — Z87891 Personal history of nicotine dependence: Secondary | ICD-10-CM | POA: Diagnosis not present

## 2023-11-16 NOTE — Progress Notes (Signed)
 Radiation Oncology Follow up Note  Name: Derek Webb   Date:   11/16/2023 MRN:  [id number] DOB: 1951-01-19    This 73 y.o. 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  He is seen today in routine follow-up and is doing well.  He is asymptomatic specifically denies cough hemoptysis chest tightness or weight loss..  He has completed his Durvalumab maintenance.  He recently had a CT scan of his chest showing posttreatment scarring the right hemithorax no evidence of recurrent or metastatic disease.  Does have an ascending aortic aneurysm which is being followed.  COMPLICATIONS OF TREATMENT: none  FOLLOW UP COMPLIANCE: keeps appointments   PHYSICAL EXAM:  BP (!) 82/68 (BP Location: Right Arm, Patient Position: Sitting, Cuff Size: Large)   Pulse 89   Temp 98.7 F (37.1 C) (Tympanic)   Resp 14   Wt 185 lb (83.9 kg)   SpO2 99%   BMI 27.72 kg/m  Well-developed well-nourished patient in NAD. HEENT reveals PERLA, EOMI, discs not visualized.  Oral cavity is clear. No oral mucosal lesions are identified. Neck is clear without evidence of cervical or supraclavicular adenopathy. Lungs are clear to A&P. Cardiac examination is essentially unremarkable with regular rate and rhythm without murmur rub or thrill. Abdomen is benign with no organomegaly or masses noted. Motor sensory and DTR levels are equal and symmetric in the upper and lower extremities. Cranial nerves II through XII are grossly intact. Proprioception is intact. No peripheral adenopathy or edema is identified. No motor or sensory levels are noted. Crude visual fields are within normal range.  RADIOLOGY RESULTS: CT scan  reviewed  PLAN: Present time patient is doing well now at 71 months from concurrent chemoradiation therapy for stage IIIa squamous cell carcinoma.  Pleased with his overall progress.  I am going to turn follow-up care over to medical oncology.  He is scheduled for repeat CT scan in June.  I would be happy to reevaluate the patient anytime should that be indicated.  I would like to take this opportunity to thank you for allowing me to participate in the care of your patient.Carmina Miller, MD

## 2023-11-21 ENCOUNTER — Other Ambulatory Visit: Payer: Self-pay | Admitting: Nurse Practitioner

## 2023-11-21 ENCOUNTER — Other Ambulatory Visit: Payer: Self-pay | Admitting: Physician Assistant

## 2023-11-21 DIAGNOSIS — K219 Gastro-esophageal reflux disease without esophagitis: Secondary | ICD-10-CM

## 2023-11-21 DIAGNOSIS — I10 Essential (primary) hypertension: Secondary | ICD-10-CM

## 2023-11-22 DIAGNOSIS — J449 Chronic obstructive pulmonary disease, unspecified: Secondary | ICD-10-CM | POA: Diagnosis not present

## 2023-11-23 ENCOUNTER — Other Ambulatory Visit: Payer: Self-pay | Admitting: Pediatrics

## 2023-11-23 DIAGNOSIS — I10 Essential (primary) hypertension: Secondary | ICD-10-CM

## 2023-11-24 ENCOUNTER — Other Ambulatory Visit: Payer: Self-pay

## 2023-11-24 ENCOUNTER — Telehealth: Payer: Self-pay | Admitting: Pediatrics

## 2023-11-24 NOTE — Telephone Encounter (Signed)
 Request for medication was received however remained with E2C2 refill pool. I have since transferred it to PCP for review.   I will follow up with patient once refill is processed.

## 2023-11-24 NOTE — Telephone Encounter (Signed)
 Copied from CRM [id number]. Topic: Clinical - Prescription Issue >> Nov 24, 2023  9:24 AM Marland Kitchen D wrote: Patient was told to call because the pharmacy hasn't gotten the authorization to fill the prescription. Patient says it has been 3 days and still nothing and really needs the medicine. Please call back. The pharmacy is [facility].   Prescription number is- [id number]

## 2023-11-25 ENCOUNTER — Other Ambulatory Visit: Payer: Self-pay

## 2023-11-28 ENCOUNTER — Telehealth: Payer: Self-pay | Admitting: Nurse Practitioner

## 2023-11-28 DIAGNOSIS — I10 Essential (primary) hypertension: Secondary | ICD-10-CM

## 2023-11-28 MED ORDER — ENALAPRIL MALEATE 10 MG PO TABS: 10.0000 mg | ORAL_TABLET | Freq: Every day | ORAL | 0 refills | Status: DC

## 2023-11-28 NOTE — Telephone Encounter (Signed)
 Patient's sister walked in requesting refill on enalapril.  30 day supply sent to get patient to his appt at the end of the month.

## 2023-11-29 NOTE — Telephone Encounter (Signed)
 Notified patient sister [name] that the script was sent to the pharmacy yesterday

## 2023-12-07 ENCOUNTER — Other Ambulatory Visit: Payer: Self-pay | Admitting: Pediatrics

## 2023-12-07 ENCOUNTER — Telehealth: Payer: Self-pay | Admitting: Pediatrics

## 2023-12-07 DIAGNOSIS — K219 Gastro-esophageal reflux disease without esophagitis: Secondary | ICD-10-CM

## 2023-12-07 NOTE — Telephone Encounter (Signed)
 Prescription Request  12/07/2023  LOV: 09/23/2023  What is the name of the medication or equipment?   pantoprazole (PROTONIX) 40 MG tablet [[id number]  Have you contacted your pharmacy to request a refill? Yes   Which pharmacy would you like this sent to?  [facility] - [place], [place] - [address] [place] [address] Phone: [phone] Fax: [phone]    Patient notified that their request is being sent to the clinical staff for review and that they should receive a response within 2 business days.   Please advise at Mobile [phone] (mobile)

## 2023-12-08 ENCOUNTER — Other Ambulatory Visit: Payer: Self-pay | Admitting: Pediatrics

## 2023-12-08 ENCOUNTER — Telehealth: Payer: Self-pay

## 2023-12-08 DIAGNOSIS — K219 Gastro-esophageal reflux disease without esophagitis: Secondary | ICD-10-CM

## 2023-12-08 MED ORDER — PANTOPRAZOLE SODIUM 40 MG PO TBEC: 40.0000 mg | DELAYED_RELEASE_TABLET | Freq: Two times a day (BID) | ORAL | 3 refills | Status: DC

## 2023-12-08 NOTE — Telephone Encounter (Signed)
 Requested medications are due for refill today.  yes  Requested medications are on the active medications list.  yes  Last refill. 04/28/2023 #90 2 rf  Future visit scheduled.   yes  Notes to clinic.  Rx signed by Aisha Hove    Requested Prescriptions  Pending Prescriptions Disp Refills   pantoprazole (PROTONIX) 40 MG tablet [Pharmacy Med Name: PANTOPRAZOLE SOD DR 40 MG TAB] 90 tablet 0    Sig: Take 1 tablet (40 mg total) by mouth 2 (two) times daily.     Gastroenterology: Proton Pump Inhibitors Failed - 12/08/2023  1:52 PM      Failed - Valid encounter within last 12 months    Recent Outpatient Visits   None     Future Appointments             In 1 week Juliette Oh, Stephannie Ehlers, MD [place] [facility]

## 2023-12-08 NOTE — Telephone Encounter (Signed)
 Provider has sent prescription to pharmacy.

## 2023-12-08 NOTE — Telephone Encounter (Signed)
 Copied from CRM [id number]. Topic: Clinical - Prescription Issue >> Dec 08, 2023  2:46 PM Santiya F wrote: Reason for CRM: Patient's sister is calling in because she has been trying to get patient's medication pantoprazole (PROTONIX) 40 MG tablet [[id number]] and she would like to speak with his provider regarding this. She says this hasn't been the first time she has had difficulty getting the medication and she would like this resolved as soon as possible.

## 2023-12-10 ENCOUNTER — Encounter: Payer: Self-pay | Admitting: Internal Medicine

## 2023-12-12 ENCOUNTER — Encounter: Payer: Self-pay | Admitting: Internal Medicine

## 2023-12-19 ENCOUNTER — Encounter: Payer: Self-pay | Admitting: Nurse Practitioner

## 2023-12-19 ENCOUNTER — Encounter: Payer: Self-pay | Admitting: Internal Medicine

## 2023-12-19 ENCOUNTER — Ambulatory Visit (INDEPENDENT_AMBULATORY_CARE_PROVIDER_SITE_OTHER): Payer: 59 | Admitting: Nurse Practitioner

## 2023-12-19 VITALS — BP 120/72 | HR 65 | Temp 98.3°F | Resp 16 | Ht 68.5 in | Wt 184.8 lb

## 2023-12-19 DIAGNOSIS — Z85118 Personal history of other malignant neoplasm of bronchus and lung: Secondary | ICD-10-CM

## 2023-12-19 DIAGNOSIS — G4734 Idiopathic sleep related nonobstructive alveolar hypoventilation: Secondary | ICD-10-CM | POA: Insufficient documentation

## 2023-12-19 DIAGNOSIS — Z9981 Dependence on supplemental oxygen: Secondary | ICD-10-CM

## 2023-12-19 DIAGNOSIS — J4489 Other specified chronic obstructive pulmonary disease: Secondary | ICD-10-CM

## 2023-12-19 MED ORDER — BUDESONIDE-FORMOTEROL FUMARATE 160-4.5 MCG/ACT IN AERO: 1.0000 | INHALATION_SPRAY | Freq: Two times a day (BID) | RESPIRATORY_TRACT | 5 refills | Status: DC

## 2023-12-19 NOTE — Progress Notes (Signed)
 [facility] [address] [place], [place] [address]  Internal MEDICINE  Office Visit Note  Patient Name: Derek Webb  [id number]  [id number]  Date of Service: 12/19/2023  Chief Complaint  Patient presents with   Follow-up    HPI Derek Webb presents for a follow-up visit for COPD and nocturnal hypoxemia.  COPD -- on symbiort and needs a PFT SOB -- overdue for PFT Nocturnal hypoxemia -- uses oxygen  at night, no issues. Not currently on CPAP  History of lung cancer, as of last office visit with Dr. Josephine Webb in June last year, no recurrence.     Current Medication: Outpatient Encounter Medications as of 12/19/2023  Medication Sig Note   ascorbic acid  (VITAMIN C ) 500 MG tablet Take 1 tablet (500 mg total) by mouth daily.    aspirin 81 MG tablet Take 81 mg by mouth daily.    atorvastatin  (LIPITOR ) 80 MG tablet Take 1 tablet (80 mg total) by mouth daily.    Cholecalciferol (VITAMIN D ) 2000 UNITS CAPS Take by mouth. VITAMIN D  3 2000 IU  ONE SOFTGEL DAILY  --OTC    clopidogrel  (PLAVIX ) 75 MG tablet Take 1 tablet (75 mg total) by mouth daily. 04/23/2023: MD Mamie Searles is aware and ok to proceed    cyanocobalamin  1000 MCG tablet Take 1 tablet (1,000 mcg total) by mouth daily.    enalapril  (VASOTEC ) 10 MG tablet Take 1 tablet (10 mg total) by mouth daily.    ezetimibe  (ZETIA ) 10 MG tablet Take 1 tablet (10 mg total) by mouth daily.    iron  polysaccharides (NIFEREX) 150 MG capsule Take 1 capsule (150 mg total) by mouth daily.    pantoprazole  (PROTONIX ) 40 MG tablet Take 1 tablet (40 mg total) by mouth 2 (two) times daily before a meal. IN AM    phenytoin  (DILANTIN ) 100 MG ER capsule Take 4 capsules (400 mg total) by mouth at bedtime.    [DISCONTINUED] budesonide -formoterol  (SYMBICORT ) 160-4.5 MCG/ACT inhaler Inhale 1 puff into the lungs 2 (two) times daily.    budesonide -formoterol  (SYMBICORT ) 160-4.5 MCG/ACT inhaler Inhale 1 puff into the lungs 2 (two) times daily.    potassium  chloride SA (KLOR-CON  M) 20 MEQ tablet Take 1 tablet (20 mEq total) by mouth daily for 7 days.    No facility-administered encounter medications on file as of 12/19/2023.    Surgical History: Past Surgical History:  Procedure Laterality Date   CAROTID SURGERY 2009 -LEFT     COLON POLYP EXCISION     COLON SURGERY     COLONOSCOPY WITH PROPOFOL  N/A 04/18/2017   Procedure: COLONOSCOPY WITH PROPOFOL ;  Surgeon: Cassie Click, MD;  Location: [facility] ENDOSCOPY;  Service: Endoscopy;  Laterality: N/A;   COLONOSCOPY WITH PROPOFOL  N/A 04/26/2023   Procedure: COLONOSCOPY WITH PROPOFOL ;  Surgeon: Quintin Buckle, DO;  Location: [facility] ENDOSCOPY;  Service: Gastroenterology;  Laterality: N/A;   ESOPHAGOGASTRODUODENOSCOPY     ESOPHAGOGASTRODUODENOSCOPY (EGD) WITH PROPOFOL  N/A 04/23/2023   Procedure: ESOPHAGOGASTRODUODENOSCOPY (EGD) WITH PROPOFOL ;  Surgeon: Quintin Buckle, DO;  Location: [facility] ENDOSCOPY;  Service: Gastroenterology;  Laterality: N/A;   GIVENS CAPSULE STUDY  04/26/2023   Procedure: GIVENS CAPSULE STUDY;  Surgeon: Quintin Buckle, DO;  Location: [facility] ENDOSCOPY;  Service: Gastroenterology;;   IR GENERIC HISTORICAL  01/21/2016   IR RADIOLOGIST EVAL & MGMT 01/21/2016 Erica Hau, MD [facility] INTERV RAD   IR GENERIC HISTORICAL  10/22/2014   IR RADIOLOGIST EVAL & MGMT 10/22/2014 Erica Hau, MD [facility] INTERV RAD   IR IMAGING  GUIDED PORT INSERTION  05/28/2022   IR RADIOLOGIST EVAL & MGMT  02/02/2017   IR RADIOLOGIST EVAL & MGMT  03/08/2018   percutaneous biopsy     POLYPECTOMY  04/26/2023   Procedure: POLYPECTOMY;  Surgeon: Quintin Buckle, DO;  Location: [facility] ENDOSCOPY;  Service: Gastroenterology;;   VIDEO BRONCHOSCOPY WITH ENDOBRONCHIAL ULTRASOUND Right 05/12/2022   Procedure: VIDEO BRONCHOSCOPY WITH ENDOBRONCHIAL ULTRASOUND;  Surgeon: Marc Senior, MD;  Location: [facility] ORS;  Service: Cardiopulmonary;  Laterality: Right;    Medical History: Past Medical History:  Diagnosis  Date   Alcoholism (HCC)    COPD (chronic obstructive pulmonary disease) (HCC)    SEVERE COPD -OCCASIONALLY USES OXYGEN  AT NIGHT-DOES NOT USE INHALERS ON REGULAR BASIS   Elevated cholesterol    GERD (gastroesophageal reflux disease)    HOH (hard of hearing)    Hypertension    Lung cancer (HCC)    LEFT UPPER LUNG CARCINOMA-NOT A CANDIDATE FOR SURGICAL RESECTION BECAUSE OF HIS COPD AND POOR CANDIDATE FOR RADIATION BECAUSE OF TRANSPORTATION PROBLEMS   Seizures (HCC)    CHRONIC DILATIN - PT STATES NO SEIZURES IN PAST COUPLE OF YEARS; PT STATES HIS SEIZURES CAUSED NUMBNESS OF ARMS AND HANDS AND NOT ABLE TO MOVE HIS ARMS   Stroke (HCC)    2 TO 3 YRS AGO-AFFECTED HIS SPEECH--ABLE TO AMBULATE WITHOUT ASSIST AND DOES YARD WORK.  DECREASED HEARING IN BOTH EARS SINCE STROKE.   Vitamin D  deficiency     Family History: Family History  Problem Relation Age of Onset   Colon polyps Sister     Social History   Socioeconomic History   Marital status: Single    Spouse name: Not on file   Number of children: Not on file   Years of education: Not on file   Highest education level: Not on file  Occupational History   Not on file  Tobacco Use   Smoking status: Former    Current packs/day: 0.00    Average packs/day: 1 pack/day for 35.0 years (35.0 ttl pk-yrs)    Types: Cigarettes    Start date: 12/21/1976    Quit date: 12/22/2011    Years since quitting: 12.0   Smokeless tobacco: Never  Vaping Use   Vaping status: Never Used  Substance and Sexual Activity   Alcohol use: Yes    Comment: occosionally    Drug use: No   Sexual activity: Not on file  Other Topics Concern   Not on file  Social History Narrative   Not on file   Social Drivers of Health   Financial Resource Strain: Low Risk  (09/19/2023)   Overall Financial Resource Strain (CARDIA)    Difficulty of Paying Living Expenses: Not hard at all  Food Insecurity: No Food Insecurity (09/19/2023)   Hunger Vital Sign    Worried About  Running Out of Food in the Last Year: Never true    Ran Out of Food in the Last Year: Never true  Transportation Needs: No Transportation Needs (09/19/2023)   PRAPARE - Administrator, Civil Service (Medical): No    Lack of Transportation (Non-Medical): No  Physical Activity: Unknown (09/19/2023)   Exercise Vital Sign    Days of Exercise per Week: 3 days    Minutes of Exercise per Session: Patient declined  Stress: No Stress Concern Present (09/19/2023)   [employer] of Occupational Health - Occupational Stress Questionnaire    Feeling of Stress : Not at all  Social Connections: Unknown (09/19/2023)  Social Advertising account executive [NHANES]    Frequency of Communication with Friends and Family: More than three times a week    Frequency of Social Gatherings with Friends and Family: More than three times a week    Attends Religious Services: Patient declined    Database administrator or Organizations: No    Attends Engineer, structural: Not on file    Marital Status: Never married  Intimate Partner Violence: Not At Risk (04/22/2023)   Humiliation, Afraid, Rape, and Kick questionnaire    Fear of Current or Ex-Partner: No    Emotionally Abused: No    Physically Abused: No    Sexually Abused: No      Review of Systems  Constitutional:  Negative for chills, fatigue and unexpected weight change.  HENT:  Negative for congestion, rhinorrhea, sneezing and sore throat.   Eyes:  Negative for redness.  Respiratory: Negative.  Negative for cough, chest tightness, shortness of breath and wheezing.   Cardiovascular: Negative.  Negative for chest pain and palpitations.  Gastrointestinal:  Negative for abdominal pain, constipation, diarrhea, nausea and vomiting.  Genitourinary:  Negative for dysuria and frequency.  Musculoskeletal:  Negative for arthralgias, back pain, joint swelling and neck pain.  Skin:  Negative for rash.  Neurological: Negative.  Negative for  tremors and numbness.  Hematological:  Negative for adenopathy. Does not bruise/bleed easily.  Psychiatric/Behavioral:  Negative for behavioral problems (Depression), sleep disturbance and suicidal ideas. The patient is not nervous/anxious.     Vital Signs: BP 120/72   Pulse 65   Temp 98.3 F (36.8 C)   Resp 16   Ht 5' 8.5" (1.74 m)   Wt 184 lb 12.8 oz (83.8 kg)   SpO2 99%   BMI 27.69 kg/m    Physical Exam Vitals reviewed.  Constitutional:      General: He is awake. He is not in acute distress.    Appearance: Normal appearance. He is well-developed and well-groomed. He is obese. He is not ill-appearing or toxic-appearing.  HENT:     Head: Normocephalic and atraumatic.  Eyes:     Extraocular Movements: Extraocular movements intact.     Pupils: Pupils are equal, round, and reactive to light.  Cardiovascular:     Rate and Rhythm: Normal rate and regular rhythm.     Heart sounds: Normal heart sounds. No murmur heard.    No friction rub. No gallop.  Pulmonary:     Effort: Pulmonary effort is normal. No respiratory distress.     Breath sounds: Normal breath sounds. No wheezing, rhonchi or rales.  Neurological:     Mental Status: He is alert and oriented to person, place, and time.     Cranial Nerves: No cranial nerve deficit.     Coordination: Coordination normal.     Gait: Gait normal.  Psychiatric:        Mood and Affect: Mood normal.        Behavior: Behavior normal. Behavior is cooperative.        Assessment/Plan: 1. Obstructive chronic bronchitis without exacerbation (HCC) (Primary) Continue symbicort  as prescribed. PFT ordered.  - Pulmonary function test; Future - budesonide -formoterol  (SYMBICORT ) 160-4.5 MCG/ACT inhaler; Inhale 1 puff into the lungs 2 (two) times daily.  Dispense: 1 each; Refill: 5  2. Nocturnal hypoxemia Continue supplemental oxygen  use as instructed at night   3. Oxygen  dependent Continue supplemental oxygen  use as instructed at night    4. History of lung cancer In remission, follow  up as needed with oncology.    General Counseling: gaines janke understanding of the findings of todays visit and agrees with plan of treatment. I have discussed any further diagnostic evaluation that may be needed or ordered today. We also reviewed his medications today. he has been encouraged to call the office with any questions or concerns that should arise related to todays visit.    Orders Placed This Encounter  Procedures   Pulmonary function test    Meds ordered this encounter  Medications   budesonide -formoterol  (SYMBICORT ) 160-4.5 MCG/ACT inhaler    Sig: Inhale 1 puff into the lungs 2 (two) times daily.    Dispense:  1 each    Refill:  5    Return in about 6 weeks (around 01/30/2024) for F/U, PFT @ [place] and have F/U visit for results with [facility] only. .   Total time spent:30 Minutes Time spent includes review of chart, medications, test results, and follow up plan with the patient.   [place] Controlled Substance Database was reviewed by me.  This patient was seen by Laurence Pons, FNP-C in collaboration with Dr. Verneta Gone as a part of collaborative care agreement.   Gulianna Hornsby R. Bobbi Burow, MSN, FNP-C Internal medicine

## 2023-12-21 ENCOUNTER — Encounter: Payer: Self-pay | Admitting: Pediatrics

## 2023-12-21 ENCOUNTER — Ambulatory Visit (INDEPENDENT_AMBULATORY_CARE_PROVIDER_SITE_OTHER): Payer: 59 | Admitting: Pediatrics

## 2023-12-21 ENCOUNTER — Encounter: Payer: Self-pay | Admitting: Internal Medicine

## 2023-12-21 VITALS — BP 116/75 | HR 83 | Temp 97.8°F | Wt 183.6 lb

## 2023-12-21 DIAGNOSIS — I10 Essential (primary) hypertension: Secondary | ICD-10-CM

## 2023-12-21 DIAGNOSIS — R569 Unspecified convulsions: Secondary | ICD-10-CM

## 2023-12-21 DIAGNOSIS — J4489 Other specified chronic obstructive pulmonary disease: Secondary | ICD-10-CM | POA: Diagnosis not present

## 2023-12-21 DIAGNOSIS — E785 Hyperlipidemia, unspecified: Secondary | ICD-10-CM

## 2023-12-21 DIAGNOSIS — K219 Gastro-esophageal reflux disease without esophagitis: Secondary | ICD-10-CM

## 2023-12-21 DIAGNOSIS — Z8669 Personal history of other diseases of the nervous system and sense organs: Secondary | ICD-10-CM

## 2023-12-21 DIAGNOSIS — I700 Atherosclerosis of aorta: Secondary | ICD-10-CM | POA: Diagnosis not present

## 2023-12-21 MED ORDER — PHENYTOIN SODIUM EXTENDED 100 MG PO CAPS: 400.0000 mg | ORAL_CAPSULE | Freq: Every day | ORAL | 3 refills | Status: DC

## 2023-12-21 MED ORDER — ATORVASTATIN CALCIUM 80 MG PO TABS: 80.0000 mg | ORAL_TABLET | Freq: Every day | ORAL | 3 refills | Status: AC

## 2023-12-21 MED ORDER — BUDESONIDE-FORMOTEROL FUMARATE 160-4.5 MCG/ACT IN AERO: 1.0000 | INHALATION_SPRAY | Freq: Two times a day (BID) | RESPIRATORY_TRACT | 5 refills | Status: AC

## 2023-12-21 MED ORDER — PANTOPRAZOLE SODIUM 40 MG PO TBEC: 40.0000 mg | DELAYED_RELEASE_TABLET | Freq: Two times a day (BID) | ORAL | 3 refills | Status: AC

## 2023-12-21 MED ORDER — EZETIMIBE 10 MG PO TABS
10.0000 mg | ORAL_TABLET | Freq: Every day | ORAL | 3 refills | Status: DC
Start: 2023-12-21 — End: 2024-05-22

## 2023-12-21 MED ORDER — CLOPIDOGREL BISULFATE 75 MG PO TABS: 75.0000 mg | ORAL_TABLET | Freq: Every day | ORAL | 3 refills | Status: AC

## 2023-12-21 MED ORDER — ENALAPRIL MALEATE 10 MG PO TABS: 10.0000 mg | ORAL_TABLET | Freq: Every day | ORAL | 0 refills | Status: DC

## 2023-12-21 NOTE — Patient Instructions (Addendum)
 I am taking over all medications.

## 2023-12-21 NOTE — Progress Notes (Signed)
 Office Visit  BP 116/75   Pulse 83   Temp 97.8 F (36.6 C) (Oral)   Wt 183 lb 9.6 oz (83.3 kg)   SpO2 94%   BMI 27.51 kg/m    Subjective:    Patient ID: Derek Webb, male    DOB: 05/31/1951, 73 y.o.   MRN: [id number]  HPI: Derek Webb is a 73 y.o. male  Chief Complaint  Patient presents with   Hypertension    #med rec Presenting to go over medications Sister, who is primary care giver, voiced concerns over difficulty with refills Reviewed medications and consolidated  #HTN Asymptomatic No chest pain, sob, palpations, orthopnea, dyspnea, PND, lower extremity edema.  Relevant past medical, surgical, family and social history reviewed and updated as indicated. Interim medical history since our last visit reviewed. Allergies and medications reviewed and updated.  ROS per HPI unless specifically indicated above     Objective:    BP 116/75   Pulse 83   Temp 97.8 F (36.6 C) (Oral)   Wt 183 lb 9.6 oz (83.3 kg)   SpO2 94%   BMI 27.51 kg/m   Wt Readings from Last 3 Encounters:  12/21/23 183 lb 9.6 oz (83.3 kg)  12/19/23 184 lb 12.8 oz (83.8 kg)  11/16/23 185 lb (83.9 kg)     Physical Exam Constitutional:      Appearance: Normal appearance.  HENT:     Head: Normocephalic and atraumatic.  Eyes:     Pupils: Pupils are equal, round, and reactive to light.  Cardiovascular:     Rate and Rhythm: Normal rate and regular rhythm.     Pulses: Normal pulses.     Heart sounds: Normal heart sounds.  Pulmonary:     Effort: Pulmonary effort is normal.     Breath sounds: Normal breath sounds.  Musculoskeletal:        General: Normal range of motion.     Cervical back: Normal range of motion.  Skin:    General: Skin is warm and dry.     Capillary Refill: Capillary refill takes less than 2 seconds.  Neurological:     General: No focal deficit present.     Mental Status: He is alert. Mental status is at baseline.  Psychiatric:        Mood and Affect: Mood  normal.        Behavior: Behavior normal.         12/21/2023   10:49 AM 09/23/2023   10:44 AM 03/03/2023   10:37 AM 09/02/2022   10:26 AM 06/15/2022    2:54 PM  Depression screen PHQ 2/9  Decreased Interest 0 0 0 0 1  Down, Depressed, Hopeless 0 0 0 0 1  PHQ - 2 Score 0 0 0 0 2  Altered sleeping 0 0   0  Tired, decreased energy 0 0   1  Change in appetite 0 0   0  Feeling bad or failure about yourself  0 0   0  Trouble concentrating 0 0   0  Moving slowly or fidgety/restless 0 0   0  Suicidal thoughts 0 0   0  PHQ-9 Score 0 0   3  Difficult doing work/chores Not difficult at all    Somewhat difficult       12/21/2023   10:50 AM 09/23/2023   10:45 AM  GAD 7 : Generalized Anxiety Score  Nervous, Anxious, on Edge 0 0  Control/stop worrying 0  0  Worry too much - different things 0 0  Trouble relaxing 0 0  Restless 0 0  Easily annoyed or irritable 0 0  Afraid - awful might happen 0 0  Total GAD 7 Score 0 0  Anxiety Difficulty Not difficult at all        Assessment & Plan:  Assessment & Plan   Hyperlipidemia, unspecified hyperlipidemia type Assessment & Plan: Well controlled. Refills sent for below.  Orders: -     Atorvastatin  Calcium ; Take 1 tablet (80 mg total) by mouth daily.  Dispense: 90 tablet; Refill: 3 -     Ezetimibe ; Take 1 tablet (10 mg total) by mouth daily.  Dispense: 90 tablet; Refill: 3  Obstructive chronic bronchitis without exacerbation (HCC) Assessment & Plan: Looking for new pulm. Doing well on inhaler, refills sent.  Orders: -     Budesonide -Formoterol  Fumarate; Inhale 1 puff into the lungs 2 (two) times daily.  Dispense: 1 each; Refill: 5 -     Pulmonary Visit  Atherosclerosis of aorta [facility]) Assessment & Plan: On statin, plavix , ASA, zetia . Continue current regimen.  Orders: -     Clopidogrel  Bisulfate; Take 1 tablet (75 mg total) by mouth daily.  Dispense: 90 tablet; Refill: 3  Essential hypertension Assessment & Plan: Well controlled  on vasotec . Continue current regimen.  Orders: -     Enalapril  Maleate; Take 1 tablet (10 mg total) by mouth daily.  Dispense: 30 tablet; Refill: 0  Gastroesophageal reflux disease without esophagitis Assessment & Plan: Well controlled on PPI. Refills sent.  Orders: -     Pantoprazole  Sodium; Take 1 tablet (40 mg total) by mouth 2 (two) times daily before a meal. IN AM  Dispense: 180 tablet; Refill: 3  Seizures (HCC) Assessment & Plan: On phenytoin . Not following w specialist. Declines referral. No recent episodes.  Orders: -     Phenytoin  Sodium Extended; Take 4 capsules (400 mg total) by mouth at bedtime.  Dispense: 360 capsule; Refill: 3     Follow up plan: Return in about 5 months (around 05/22/2024) for Chronic illness f/u.  Hadassah Letters, MD  Today's visit encompasses complex care management of above conditions as part of ongoing care as primary care doctor and longitudinal relationship.

## 2023-12-22 ENCOUNTER — Other Ambulatory Visit: Payer: Self-pay

## 2023-12-22 ENCOUNTER — Ambulatory Visit: Payer: 59 | Admitting: Internal Medicine

## 2023-12-22 DIAGNOSIS — J449 Chronic obstructive pulmonary disease, unspecified: Secondary | ICD-10-CM | POA: Diagnosis not present

## 2023-12-26 ENCOUNTER — Encounter: Payer: Self-pay | Admitting: Pediatrics

## 2023-12-26 NOTE — Assessment & Plan Note (Signed)
 On statin, plavix , ASA, zetia . Continue current regimen.

## 2023-12-26 NOTE — Assessment & Plan Note (Signed)
 Well controlled. Refills sent for below.

## 2023-12-26 NOTE — Assessment & Plan Note (Signed)
 Well controlled on PPI. Refills sent.

## 2023-12-26 NOTE — Assessment & Plan Note (Signed)
 Looking for new pulm. Doing well on inhaler, refills sent.

## 2023-12-26 NOTE — Assessment & Plan Note (Signed)
 On phenytoin . Not following w specialist. Declines referral. No recent episodes.

## 2023-12-26 NOTE — Assessment & Plan Note (Signed)
 Well controlled on vasotec . Continue current regimen.

## 2023-12-27 ENCOUNTER — Other Ambulatory Visit: Payer: Self-pay | Admitting: Nurse Practitioner

## 2023-12-27 DIAGNOSIS — I10 Essential (primary) hypertension: Secondary | ICD-10-CM

## 2023-12-28 ENCOUNTER — Encounter: Payer: Self-pay | Admitting: Internal Medicine

## 2023-12-29 NOTE — Telephone Encounter (Signed)
 Duplicate request, refilled 12/21/23.  Requested Prescriptions  Pending Prescriptions Disp Refills   enalapril  (VASOTEC ) 10 MG tablet [Pharmacy Med Name: ENALAPRIL  MALEATE 10 MG TAB] 30 tablet 0    Sig: Take 1 tablet (10 mg total) by mouth daily.     Cardiovascular:  ACE Inhibitors Failed - 12/29/2023 11:01 AM      Failed - Cr in normal range and within 180 days    Creatinine  Date Value Ref Range Status  09/20/2023 0.58 (L) 0.61 - 1.24 mg/dL Final         Failed - K in normal range and within 180 days    Potassium  Date Value Ref Range Status  09/20/2023 3.4 (L) 3.5 - 5.1 mmol/L Final         Passed - Patient is not pregnant      Passed - Last BP in normal range    BP Readings from Last 1 Encounters:  12/21/23 116/75         Passed - Valid encounter within last 6 months    Recent Outpatient Visits           1 week ago Hyperlipidemia, unspecified hyperlipidemia type   [facility] Hadassah Letters, MD

## 2024-01-04 ENCOUNTER — Telehealth: Payer: Self-pay | Admitting: Internal Medicine

## 2024-01-04 ENCOUNTER — Ambulatory Visit (INDEPENDENT_AMBULATORY_CARE_PROVIDER_SITE_OTHER): Admitting: Internal Medicine

## 2024-01-04 DIAGNOSIS — J4489 Other specified chronic obstructive pulmonary disease: Secondary | ICD-10-CM | POA: Diagnosis not present

## 2024-01-04 NOTE — Telephone Encounter (Signed)
 Patient's sister requested MR to be sent to Dr. Darnelle Elders with [facility]. Per Harland Libman, they can see all patient's information in Epic, no need to fax-Toni

## 2024-01-17 ENCOUNTER — Ambulatory Visit: Admitting: Nurse Practitioner

## 2024-01-17 ENCOUNTER — Encounter: Payer: Self-pay | Admitting: Nurse Practitioner

## 2024-01-17 VITALS — BP 120/72 | HR 74 | Temp 98.1°F | Resp 16 | Ht 68.5 in | Wt 183.6 lb

## 2024-01-17 DIAGNOSIS — G4734 Idiopathic sleep related nonobstructive alveolar hypoventilation: Secondary | ICD-10-CM | POA: Diagnosis not present

## 2024-01-17 DIAGNOSIS — Z85118 Personal history of other malignant neoplasm of bronchus and lung: Secondary | ICD-10-CM | POA: Diagnosis not present

## 2024-01-17 DIAGNOSIS — J4489 Other specified chronic obstructive pulmonary disease: Secondary | ICD-10-CM | POA: Diagnosis not present

## 2024-01-17 DIAGNOSIS — Z9981 Dependence on supplemental oxygen: Secondary | ICD-10-CM | POA: Diagnosis not present

## 2024-01-17 NOTE — Progress Notes (Signed)
 [facility] [address] [place], [place] [address]  Internal MEDICINE  Office Visit Note  Patient Name: Derek Webb  [id number]  [id number]  Date of Service: 01/17/2024  Chief Complaint  Patient presents with   Follow-up    Review pft    HPI Vint presents for a follow-up visit for PFT results  PFT results discussed with patient and his sister who is his caregiver today.   The forced vital capacity moderately decreased at 1.68 L.  The FEV1 is 0.85 L which is  33% of predicted and is severely decreased.  FEV1/FVC ratio is  moderately decreased.  Postbronchodilator there is no significant  improvement in the FEV1 however clinical improvement may occur in  the absence of spirometric improvement.  Total lung capacity is  increased by plethysmography.  Residual volume is increased  residual volume/total lung capacity ratio is increased. FRC is  increased.  DLCO is severely decreased.  Overall PFT results are indicative of severe restrictive lung disease.   Patient is establishing with a new pulm doctor in a couple of weeks at [facility] Pulmonary care     Current Medication: Outpatient Encounter Medications as of 01/17/2024  Medication Sig   ascorbic acid  (VITAMIN C ) 500 MG tablet Take 1 tablet (500 mg total) by mouth daily.   aspirin 81 MG tablet Take 81 mg by mouth daily.   atorvastatin  (LIPITOR ) 80 MG tablet Take 1 tablet (80 mg total) by mouth daily.   budesonide -formoterol  (SYMBICORT ) 160-4.5 MCG/ACT inhaler Inhale 1 puff into the lungs 2 (two) times daily.   Cholecalciferol (VITAMIN D ) 2000 UNITS CAPS Take by mouth. VITAMIN D  3 2000 IU  ONE SOFTGEL DAILY  --OTC   clopidogrel  (PLAVIX ) 75 MG tablet Take 1 tablet (75 mg total) by mouth daily.   cyanocobalamin  1000 MCG tablet Take 1 tablet (1,000 mcg total) by mouth daily.   enalapril  (VASOTEC ) 10 MG tablet Take 1 tablet (10 mg total) by mouth daily.   ezetimibe  (ZETIA ) 10 MG tablet Take 1 tablet (10 mg total)  by mouth daily.   iron  polysaccharides (NIFEREX) 150 MG capsule Take 1 capsule (150 mg total) by mouth daily.   pantoprazole  (PROTONIX ) 40 MG tablet Take 1 tablet (40 mg total) by mouth 2 (two) times daily before a meal. IN AM   phenytoin  (DILANTIN ) 100 MG ER capsule Take 4 capsules (400 mg total) by mouth at bedtime.   No facility-administered encounter medications on file as of 01/17/2024.    Surgical History: Past Surgical History:  Procedure Laterality Date   CARDIAC VALVE REPLACEMENT     CAROTID SURGERY 2009 -LEFT     COLON POLYP EXCISION     COLON SURGERY     COLONOSCOPY WITH PROPOFOL  N/A 04/18/2017   Procedure: COLONOSCOPY WITH PROPOFOL ;  Surgeon: Cassie Click, MD;  Location: [facility] ENDOSCOPY;  Service: Endoscopy;  Laterality: N/A;   COLONOSCOPY WITH PROPOFOL  N/A 04/26/2023   Procedure: COLONOSCOPY WITH PROPOFOL ;  Surgeon: Quintin Buckle, DO;  Location: [facility] ENDOSCOPY;  Service: Gastroenterology;  Laterality: N/A;   ESOPHAGOGASTRODUODENOSCOPY     ESOPHAGOGASTRODUODENOSCOPY (EGD) WITH PROPOFOL  N/A 04/23/2023   Procedure: ESOPHAGOGASTRODUODENOSCOPY (EGD) WITH PROPOFOL ;  Surgeon: Quintin Buckle, DO;  Location: [facility] ENDOSCOPY;  Service: Gastroenterology;  Laterality: N/A;   GIVENS CAPSULE STUDY  04/26/2023   Procedure: GIVENS CAPSULE STUDY;  Surgeon: Quintin Buckle, DO;  Location: [facility] ENDOSCOPY;  Service: Gastroenterology;;   IR GENERIC HISTORICAL  01/21/2016   IR RADIOLOGIST EVAL & MGMT  01/21/2016 Erica Hau, MD [facility] INTERV RAD   IR GENERIC HISTORICAL  10/22/2014   IR RADIOLOGIST EVAL & MGMT 10/22/2014 Erica Hau, MD [facility] INTERV RAD   IR IMAGING GUIDED PORT INSERTION  05/28/2022   IR RADIOLOGIST EVAL & MGMT  02/02/2017   IR RADIOLOGIST EVAL & MGMT  03/08/2018   percutaneous biopsy     POLYPECTOMY  04/26/2023   Procedure: POLYPECTOMY;  Surgeon: Quintin Buckle, DO;  Location: [facility] ENDOSCOPY;  Service: Gastroenterology;;   VIDEO BRONCHOSCOPY  WITH ENDOBRONCHIAL ULTRASOUND Right 05/12/2022   Procedure: VIDEO BRONCHOSCOPY WITH ENDOBRONCHIAL ULTRASOUND;  Surgeon: Marc Senior, MD;  Location: [facility] ORS;  Service: Cardiopulmonary;  Laterality: Right;    Medical History: Past Medical History:  Diagnosis Date   Alcoholism (HCC)    Allergy    Blood transfusion without reported diagnosis    COPD (chronic obstructive pulmonary disease) (HCC)    SEVERE COPD -OCCASIONALLY USES OXYGEN  AT NIGHT-DOES NOT USE INHALERS ON REGULAR BASIS   Elevated cholesterol    GERD (gastroesophageal reflux disease)    HOH (hard of hearing)    Hypertension    Lung cancer (HCC)    LEFT UPPER LUNG CARCINOMA-NOT A CANDIDATE FOR SURGICAL RESECTION BECAUSE OF HIS COPD AND POOR CANDIDATE FOR RADIATION BECAUSE OF TRANSPORTATION PROBLEMS   Oxygen  deficiency    Seizures (HCC)    CHRONIC DILATIN - PT STATES NO SEIZURES IN PAST COUPLE OF YEARS; PT STATES HIS SEIZURES CAUSED NUMBNESS OF ARMS AND HANDS AND NOT ABLE TO MOVE HIS ARMS   Stroke (HCC)    2 TO 3 YRS AGO-AFFECTED HIS SPEECH--ABLE TO AMBULATE WITHOUT ASSIST AND DOES YARD WORK.  DECREASED HEARING IN BOTH EARS SINCE STROKE.   Vitamin D  deficiency     Family History: Family History  Problem Relation Age of Onset   Cancer Father    Stroke Father    Colon polyps Sister    Hearing loss Sister     Social History   Socioeconomic History   Marital status: Single    Spouse name: Not on file   Number of children: Not on file   Years of education: Not on file   Highest education level: Not on file  Occupational History   Not on file  Tobacco Use   Smoking status: Former    Current packs/day: 0.00    Average packs/day: 1 pack/day for 35.0 years (35.0 ttl pk-yrs)    Types: Cigarettes    Start date: 12/21/1976    Quit date: 12/22/2011    Years since quitting: 12.0   Smokeless tobacco: Never  Vaping Use   Vaping status: Never Used  Substance and Sexual Activity   Alcohol use: Yes    Comment:  occosionally    Drug use: No   Sexual activity: Not on file  Other Topics Concern   Not on file  Social History Narrative   Not on file   Social Drivers of Health   Financial Resource Strain: Low Risk  (09/19/2023)   Overall Financial Resource Strain (CARDIA)    Difficulty of Paying Living Expenses: Not hard at all  Food Insecurity: No Food Insecurity (09/19/2023)   Hunger Vital Sign    Worried About Running Out of Food in the Last Year: Never true    Ran Out of Food in the Last Year: Never true  Transportation Needs: No Transportation Needs (09/19/2023)   PRAPARE - Administrator, Civil Service (Medical): No    Lack of Transportation (  Non-Medical): No  Physical Activity: Unknown (09/19/2023)   Exercise Vital Sign    Days of Exercise per Week: 3 days    Minutes of Exercise per Session: Patient declined  Stress: No Stress Concern Present (09/19/2023)   [employer] of Occupational Health - Occupational Stress Questionnaire    Feeling of Stress : Not at all  Social Connections: Unknown (09/19/2023)   Social Connection and Isolation Panel [NHANES]    Frequency of Communication with Friends and Family: More than three times a week    Frequency of Social Gatherings with Friends and Family: More than three times a week    Attends Religious Services: Patient declined    Database administrator or Organizations: No    Attends Engineer, structural: Not on file    Marital Status: Never married  Intimate Partner Violence: Not At Risk (04/22/2023)   Humiliation, Afraid, Rape, and Kick questionnaire    Fear of Current or Ex-Partner: No    Emotionally Abused: No    Physically Abused: No    Sexually Abused: No      Review of Systems  Constitutional:  Negative for chills, fatigue and unexpected weight change.  HENT:  Negative for congestion, rhinorrhea, sneezing and sore throat.   Eyes:  Negative for redness.  Respiratory: Negative.  Negative for cough, chest  tightness, shortness of breath and wheezing.   Cardiovascular: Negative.  Negative for chest pain and palpitations.  Gastrointestinal:  Negative for abdominal pain, constipation, diarrhea, nausea and vomiting.  Genitourinary:  Negative for dysuria and frequency.  Musculoskeletal:  Negative for arthralgias, back pain, joint swelling and neck pain.  Skin:  Negative for rash.  Neurological: Negative.  Negative for tremors and numbness.  Hematological:  Negative for adenopathy. Does not bruise/bleed easily.  Psychiatric/Behavioral:  Negative for behavioral problems (Depression), sleep disturbance and suicidal ideas. The patient is not nervous/anxious.     Vital Signs: BP 120/72   Pulse 74   Temp 98.1 F (36.7 C)   Resp 16   Ht 5' 8.5" (1.74 m)   Wt 183 lb 9.6 oz (83.3 kg)   SpO2 96%   BMI 27.51 kg/m    Physical Exam Vitals reviewed.  Constitutional:      General: He is awake. He is not in acute distress.    Appearance: Normal appearance. He is well-developed and well-groomed. He is obese. He is not ill-appearing or toxic-appearing.  HENT:     Head: Normocephalic and atraumatic.  Eyes:     Extraocular Movements: Extraocular movements intact.     Pupils: Pupils are equal, round, and reactive to light.  Cardiovascular:     Rate and Rhythm: Normal rate and regular rhythm.     Heart sounds: Normal heart sounds. No murmur heard.    No friction rub. No gallop.  Pulmonary:     Effort: Pulmonary effort is normal. No respiratory distress.     Breath sounds: Normal breath sounds. No wheezing, rhonchi or rales.  Neurological:     Mental Status: He is alert and oriented to person, place, and time.     Cranial Nerves: No cranial nerve deficit.     Coordination: Coordination normal.     Gait: Gait normal.  Psychiatric:        Mood and Affect: Mood normal.        Behavior: Behavior normal. Behavior is cooperative.       Assessment/Plan: 1. Obstructive chronic bronchitis without  exacerbation (HCC) (Primary) PFT results discussed,  patient establishing with a new pulmonologist at [facility] Care  2. Nocturnal hypoxemia Uses oxygen  at night   3. Oxygen  dependent Uses oxygen  at night   4. History of lung cancer Sees oncology    General Counseling: franciscojavier wronski understanding of the findings of todays visit and agrees with plan of treatment. I have discussed any further diagnostic evaluation that may be needed or ordered today. We also reviewed his medications today. he has been encouraged to call the office with any questions or concerns that should arise related to todays visit.    No orders of the defined types were placed in this encounter.   No orders of the defined types were placed in this encounter.   Return for patient transferred to a new pulmonologist. no further follow up appts here. .   Total time spent:30 Minutes Time spent includes review of chart, medications, test results, and follow up plan with the patient.   [place] Controlled Substance Database was reviewed by me.  This patient was seen by Laurence Pons, FNP-C in collaboration with Dr. Verneta Gone as a part of collaborative care agreement.   Orel Cooler R. Bobbi Burow, MSN, FNP-C Internal medicine

## 2024-01-22 ENCOUNTER — Encounter: Payer: Self-pay | Admitting: Nurse Practitioner

## 2024-01-22 DIAGNOSIS — J449 Chronic obstructive pulmonary disease, unspecified: Secondary | ICD-10-CM | POA: Diagnosis not present

## 2024-01-25 NOTE — Procedures (Signed)
 [facility] [address] [place], [address]    Complete Pulmonary Function Testing Interpretation:  FINDINGS:  The forced vital capacity is mildly decreased.  FEV1 is 0.85 L which is 33% of predicted and is severely decreased.  Postbronchodilator there is no significant change in FEV1.  Total lung capacity is increased residual volume is increased FRC is increased DLCO is severely decreased.  IMPRESSION:  this pulmonary function study is consistent with severe obstructive lung disease  Cordie Deters, MD [facility] Pulmonary Critical Care Medicine Sleep Medicine

## 2024-01-27 ENCOUNTER — Other Ambulatory Visit: Payer: Self-pay | Admitting: Pediatrics

## 2024-01-27 ENCOUNTER — Other Ambulatory Visit: Payer: Self-pay | Admitting: Nurse Practitioner

## 2024-01-27 DIAGNOSIS — R569 Unspecified convulsions: Secondary | ICD-10-CM

## 2024-01-27 DIAGNOSIS — I10 Essential (primary) hypertension: Secondary | ICD-10-CM

## 2024-01-27 NOTE — Telephone Encounter (Signed)
 Requested Prescriptions  Pending Prescriptions Disp Refills   enalapril  (VASOTEC ) 10 MG tablet [Pharmacy Med Name: ENALAPRIL  MALEATE 10 MG TAB] 90 tablet 0    Sig: Take 1 tablet (10 mg total) by mouth daily.     Cardiovascular:  ACE Inhibitors Failed - 01/27/2024  5:37 PM      Failed - Cr in normal range and within 180 days    Creatinine  Date Value Ref Range Status  09/20/2023 0.58 (L) 0.61 - 1.24 mg/dL Final         Failed - K in normal range and within 180 days    Potassium  Date Value Ref Range Status  09/20/2023 3.4 (L) 3.5 - 5.1 mmol/L Final         Passed - Patient is not pregnant      Passed - Last BP in normal range    BP Readings from Last 1 Encounters:  01/17/24 120/72         Passed - Valid encounter within last 6 months    Recent Outpatient Visits           1 month ago Hyperlipidemia, unspecified hyperlipidemia type   [facility] Hadassah Letters, MD

## 2024-01-30 ENCOUNTER — Ambulatory Visit: Admitting: Student in an Organized Health Care Education/Training Program

## 2024-02-03 ENCOUNTER — Ambulatory Visit
Admission: RE | Admit: 2024-02-03 | Discharge: 2024-02-03 | Disposition: A | Discharge status: Home / Self Care | Payer: 59 | Source: Ambulatory Visit | Attending: Internal Medicine | Admitting: Internal Medicine

## 2024-02-03 DIAGNOSIS — C3490 Malignant neoplasm of unspecified part of unspecified bronchus or lung: Secondary | ICD-10-CM | POA: Diagnosis not present

## 2024-02-03 DIAGNOSIS — I7121 Aneurysm of the ascending aorta, without rupture: Secondary | ICD-10-CM | POA: Diagnosis not present

## 2024-02-03 DIAGNOSIS — J439 Emphysema, unspecified: Secondary | ICD-10-CM | POA: Diagnosis not present

## 2024-02-03 DIAGNOSIS — J984 Other disorders of lung: Secondary | ICD-10-CM | POA: Diagnosis not present

## 2024-02-03 MED ORDER — IOHEXOL 350 MG/ML SOLN
75.0000 mL | Freq: Once | INTRAVENOUS | Status: AC | PRN
  Administered 2024-02-03: 75 mL via INTRAVENOUS

## 2024-02-14 ENCOUNTER — Ambulatory Visit (INDEPENDENT_AMBULATORY_CARE_PROVIDER_SITE_OTHER): Admitting: Pulmonary Disease

## 2024-02-14 ENCOUNTER — Encounter: Payer: Self-pay | Admitting: Pulmonary Disease

## 2024-02-14 VITALS — BP 120/66 | HR 74 | Temp 98.1°F | Ht 68.5 in | Wt 183.0 lb

## 2024-02-14 DIAGNOSIS — Z87891 Personal history of nicotine dependence: Secondary | ICD-10-CM | POA: Diagnosis not present

## 2024-02-14 DIAGNOSIS — C3490 Malignant neoplasm of unspecified part of unspecified bronchus or lung: Secondary | ICD-10-CM

## 2024-02-14 DIAGNOSIS — Z8669 Personal history of other diseases of the nervous system and sense organs: Secondary | ICD-10-CM | POA: Diagnosis not present

## 2024-02-14 DIAGNOSIS — J449 Chronic obstructive pulmonary disease, unspecified: Secondary | ICD-10-CM | POA: Diagnosis not present

## 2024-02-14 MED ORDER — ALBUTEROL SULFATE HFA 108 (90 BASE) MCG/ACT IN AERS: 2.0000 | INHALATION_SPRAY | Freq: Four times a day (QID) | RESPIRATORY_TRACT | 2 refills | Status: AC | PRN

## 2024-02-14 NOTE — Patient Instructions (Addendum)
 VISIT SUMMARY:  You came in today for a follow-up visit regarding your severe COPD and non-small cell lung cancer. Your COPD is stable with your current medication, and your recent scan showed no recurrence of lung cancer.  YOUR PLAN:  -SEVERE COPD: Chronic Obstructive Pulmonary Disease (COPD) is a long-term lung condition that makes it hard to breathe. You are currently using a Symbicort  inhaler, taking two puffs twice a day. We will also prescribe an albuterol  inhaler for emergency use. A spacer will be provided to help with inhaler delivery. Please continue with your current regimen and schedule a follow-up in 4-6 months for repeat pulmonary function tests.  -NON-SMALL CELL LUNG CANCER: Non-small cell lung cancer is a type of lung cancer. Your recent scan showed no recurrence of the cancer. You have a follow-up appointment with your oncologist, Dr. Rennie, scheduled for Friday.  INSTRUCTIONS:  Please schedule a follow-up appointment in 4-6 months for repeat pulmonary function tests. Continue to follow up with your oncologist, Dr. Rennie, as scheduled.

## 2024-02-14 NOTE — Progress Notes (Signed)
 Subjective:    Patient ID: Derek Webb, male    DOB: Jul 19, 1951, 73 y.o.   MRN: [id number]  Patient Care Team: Herold Hadassah SQUIBB, MD as PCP - General (Family Medicine) Vincenzo Slough, [facility] (Inactive) as Pharmacist (Pharmacist) Verdene Gills, RN as Oncology Nurse Navigator Fernand Elfreda DELENA, MD as Consulting Physician (Internal Medicine) Rennie Cindy SAUNDERS, MD as Consulting Physician (Oncology)  Chief Complaint  Patient presents with   Follow-up    No breathing problems    BACKGROUND/INTERVAL: Patient is a 73 year old former smoker who presents to transfer care for COPD management here.  Patient previously seen here strictly for bronchoscopy with endobronchial ultrasound.  He underwent video bronchoscopy with endobronchial ultrasound on 12 May 2022 for a right lung mass.  This was positive for squamous cell carcinoma.  I last saw the patient on 09 February 2023 at that time with a concern for recurrent lung cancer.  However, PET/CT performed prior to that visit showed no evidence of recurrence.  The patient has previously been followed by Dr. Elfreda Fernand.  Patient's sister who is his primary caretaker requested transfer to this practice.  HPI Discussed the use of AI scribe software for clinical note transcription with the patient, who gave verbal consent to proceed.  History of Present Illness   CHILTON SALLADE is a 73 year old male with severe COPD and non-small cell lung cancer who presents to establish care.  He presents with his sister Rock.  He has severe COPD and is currently using a Symbicort  inhaler, taking two puffs twice a day. He does not have an rescue inhaler but is managing well with his current regimen. No shortness of breath is reported, and his weight remains stable at 183 pounds. He has not undergone a breathing test in two years, although a note indicates a test was done at Dr. Kathern office, confirming severe COPD.  His most recent chest CT scan on February 03, 2024, showed no recurrence of non-small cell lung cancer.  He has right upper lobe volume loss as prior.  Postradiation scarring on the right.  He is scheduled to see a new oncologist, Dr. Rennie, on Friday has Dr. Genevia Rous, his prior oncologist, has left the area.   He has prior history of seizure disorder and is on Dilantin .  His sister Rock, oversees his care.     Review of Systems A 10 point review of systems was performed and it is as noted above otherwise negative.   Patient Active Problem List   Diagnosis Date Noted   Nocturnal hypoxemia 12/19/2023   Aortic dilatation (HCC) 04/21/2023   Iron  deficiency anemia 04/19/2023   Seizures (HCC) 07/20/2019   GERD (gastroesophageal reflux disease) 02/08/2018   Obstructive chronic bronchitis without exacerbation (HCC) 02/08/2018   Atherosclerosis of aorta (HCC) 12/18/2017   Cerebrovascular accident (CVA) (HCC) 11/01/2017   HOH (hard of hearing) 11/01/2017   Essential hypertension 11/01/2017   Hyperlipidemia 11/01/2017   Hx of adenomatous polyp of colon 12/16/2016   Squamous cell lung cancer (HCC)     Social History   Tobacco Use   Smoking status: Former    Current packs/day: 0.00    Average packs/day: 1 pack/day for 35.0 years (35.0 ttl pk-yrs)    Types: Cigarettes    Start date: 12/21/1976    Quit date: 12/22/2011    Years since quitting: 12.1   Smokeless tobacco: Never  Substance Use Topics   Alcohol use: Yes    Comment: occosionally  No Known Allergies  Current Meds  Medication Sig   albuterol  (VENTOLIN  HFA) 108 (90 Base) MCG/ACT inhaler Inhale 2 puffs into the lungs every 6 (six) hours as needed.   ascorbic acid  (VITAMIN C ) 500 MG tablet Take 1 tablet (500 mg total) by mouth daily.   aspirin 81 MG tablet Take 81 mg by mouth daily.   atorvastatin  (LIPITOR ) 80 MG tablet Take 1 tablet (80 mg total) by mouth daily.   budesonide -formoterol  (SYMBICORT ) 160-4.5 MCG/ACT inhaler Inhale 1 puff into the lungs 2 (two)  times daily.   Cholecalciferol (VITAMIN D ) 2000 UNITS CAPS Take by mouth. VITAMIN D  3 2000 IU  ONE SOFTGEL DAILY  --OTC   clopidogrel  (PLAVIX ) 75 MG tablet Take 1 tablet (75 mg total) by mouth daily.   cyanocobalamin  1000 MCG tablet Take 1 tablet (1,000 mcg total) by mouth daily.   enalapril  (VASOTEC ) 10 MG tablet Take 1 tablet (10 mg total) by mouth daily.   ezetimibe  (ZETIA ) 10 MG tablet Take 1 tablet (10 mg total) by mouth daily.   iron  polysaccharides (NIFEREX) 150 MG capsule Take 1 capsule (150 mg total) by mouth daily.   pantoprazole  (PROTONIX ) 40 MG tablet Take 1 tablet (40 mg total) by mouth 2 (two) times daily before a meal. IN AM   phenytoin  (DILANTIN ) 100 MG ER capsule Take 4 capsules (400 mg total) by mouth at bedtime.    Immunization History  Administered Date(s) Administered   Fluad Trivalent(High Dose 65+) 09/23/2023   Influenza Inj Mdck Quad Pf 06/21/2022   Influenza Inj Mdck Quad With Preservative 07/21/2018   Influenza Split 05/06/2012   Influenza, Mdck, Trivalent,PF 6+ MOS(egg free) 06/21/2022   Influenza,inj,quad, With Preservative 05/21/2019   Influenza-Unspecified 06/13/2017   PFIZER Comirnaty(Gray Top)Covid-19 Tri-Sucrose Vaccine 10/08/2020, 03/09/2021   PFIZER(Purple Top)SARS-COV-2 Vaccination 03/11/2020, 03/31/2020, 04/01/2020, 10/07/2020   Pfizer(Comirnaty)Fall Seasonal Vaccine 12 years and older 09/23/2023   Pneumococcal Polysaccharide-23 04/20/2020        Objective:     BP 120/66 (BP Location: Left Arm, Patient Position: Sitting, Cuff Size: Normal)   Pulse 74   Temp 98.1 F (36.7 C) (Oral)   Ht 5' 8.5 (1.74 m)   Wt 183 lb (83 kg)   SpO2 98%   BMI 27.42 kg/m   SpO2: 98 %  GENERAL: Well-developed, well-nourished gentleman, no acute distress. HEAD: Normocephalic, atraumatic.  EYES: Pupils equal, round, reactive to light.  No scleral icterus.  MOUTH: Edentulous NECK: Supple. No thyromegaly. Trachea midline. No JVD.  No adenopathy. PULMONARY:  Good air entry bilaterally.  Rare end expiratory wheeze. CARDIOVASCULAR: S1 and S2. Regular rate and rhythm.  No rubs, murmurs or gallops heard. ABDOMEN: Benign. MUSCULOSKELETAL: No joint deformity, no clubbing, no edema.  NEUROLOGIC: No overt focal deficit, no gait disturbance, speech is fluent. SKIN: Intact,warm,dry. PSYCH: Flat affect, normal behavior.       Assessment & Plan:     ICD-10-CM   1. Stage 3 severe COPD by GOLD classification (HCC)  J44.9 Pulmonary function test    2. Squamous cell carcinoma of lung, unspecified laterality (HCC)  C34.90     3. History of seizure disorder  Z86.69       Orders Placed This Encounter  Procedures   Pulmonary function test    Standing Status:   Future    Expiration Date:   02/13/2025    Scheduling Instructions:     Upon return    Where should this test be performed?:   Outpatient Pulmonary  What type of PFT is being ordered?:   Full PFT   Meds ordered this encounter  Medications   albuterol  (VENTOLIN  HFA) 108 (90 Base) MCG/ACT inhaler    Sig: Inhale 2 puffs into the lungs every 6 (six) hours as needed.    Dispense:  8 g    Refill:  2   Discussion:    Severe COPD, stage 3 Severe COPD. No current dyspnea. Continues Symbicort  inhaler use, lacks rescue inhaler. Weight stable at 183 lbs. - Prescribe albuterol  inhaler for rescue use - Instruct to use Symbicort  inhaler two puffs BID, rinse mouth after use - Consider spacer for improved inhaler delivery - Schedule follow-up in 4-6 months with repeat pulmonary function tests  Non-small cell lung cancer Non-small cell lung cancer with no recurrence on recent scan dated February 03, 2024. Follow-up with oncologist Dr. Rennie scheduled for Friday. - Follow-up with oncologist Dr. Rennie Certain if symptoms do not improve or worsen, to please contact office for sooner follow up or seek emergency care.    I spent 40 minutes of dedicated to the care of this patient on the date  of this encounter to include pre-visit review of records, face-to-face time with the patient discussing conditions above, post visit ordering of testing, clinical documentation with the electronic health record, making appropriate referrals as documented, and communicating necessary findings to members of the patients care team.     C. Leita Sanders, MD Advanced Bronchoscopy PCCM [facility]    *This note was generated using voice recognition software/Dragon and/or AI transcription program.  Despite best efforts to proofread, errors can occur which can change the meaning. Any transcriptional errors that result from this process are unintentional and may not be fully corrected at the time of dictation.

## 2024-02-15 ENCOUNTER — Other Ambulatory Visit: Payer: Self-pay

## 2024-02-15 ENCOUNTER — Other Ambulatory Visit: Payer: Self-pay | Admitting: *Deleted

## 2024-02-15 DIAGNOSIS — C3490 Malignant neoplasm of unspecified part of unspecified bronchus or lung: Secondary | ICD-10-CM

## 2024-02-15 DIAGNOSIS — C3491 Malignant neoplasm of unspecified part of right bronchus or lung: Secondary | ICD-10-CM

## 2024-02-16 ENCOUNTER — Other Ambulatory Visit: Payer: Self-pay

## 2024-02-17 ENCOUNTER — Inpatient Hospital Stay: Attending: Internal Medicine | Admitting: Internal Medicine

## 2024-02-17 ENCOUNTER — Encounter: Payer: Self-pay | Admitting: Internal Medicine

## 2024-02-17 ENCOUNTER — Other Ambulatory Visit: Payer: 59

## 2024-02-17 ENCOUNTER — Inpatient Hospital Stay

## 2024-02-17 ENCOUNTER — Ambulatory Visit: Payer: 59

## 2024-02-17 ENCOUNTER — Ambulatory Visit: Payer: 59 | Admitting: Internal Medicine

## 2024-02-17 VITALS — BP 113/68 | HR 61 | Resp 18

## 2024-02-17 VITALS — BP 112/77 | HR 78 | Temp 98.1°F | Resp 18 | Ht 68.0 in | Wt 181.0 lb

## 2024-02-17 DIAGNOSIS — C3411 Malignant neoplasm of upper lobe, right bronchus or lung: Secondary | ICD-10-CM | POA: Insufficient documentation

## 2024-02-17 DIAGNOSIS — D509 Iron deficiency anemia, unspecified: Secondary | ICD-10-CM | POA: Diagnosis not present

## 2024-02-17 DIAGNOSIS — C3491 Malignant neoplasm of unspecified part of right bronchus or lung: Secondary | ICD-10-CM

## 2024-02-17 DIAGNOSIS — C3490 Malignant neoplasm of unspecified part of unspecified bronchus or lung: Secondary | ICD-10-CM

## 2024-02-17 LAB — CMP (CANCER CENTER ONLY)
ALT: 26 U/L (ref 0–44)
AST: 24 U/L (ref 15–41)
Albumin: 3.8 g/dL (ref 3.5–5.0)
Alkaline Phosphatase: 93 U/L (ref 38–126)
Anion gap: 8 (ref 5–15)
BUN: 11 mg/dL (ref 8–23)
CO2: 24 mmol/L (ref 22–32)
Calcium: 8.9 mg/dL (ref 8.9–10.3)
Chloride: 101 mmol/L (ref 98–111)
Creatinine: 0.7 mg/dL (ref 0.61–1.24)
GFR, Estimated: >60 mL/min (ref >60)
Glucose, Bld: 70 mg/dL (ref 70–99)
Potassium: 4.3 mmol/L (ref 3.5–5.1)
Sodium: 133 mmol/L — ABNORMAL LOW (ref 135–145)
Total Bilirubin: 0.7 mg/dL (ref 0.0–1.2)
Total Protein: 7.7 g/dL (ref 6.5–8.1)

## 2024-02-17 LAB — CBC WITH DIFFERENTIAL (CANCER CENTER ONLY)
Abs Immature Granulocytes: 0.02 10*3/uL (ref 0.00–0.07)
Basophils Absolute: 0 10*3/uL (ref 0.0–0.1)
Basophils Relative: 1 %
Eosinophils Absolute: 0.1 10*3/uL (ref 0.0–0.5)
Eosinophils Relative: 1 %
HCT: 29.6 % — ABNORMAL LOW (ref 39.0–52.0)
Hemoglobin: 9.2 g/dL — ABNORMAL LOW (ref 13.0–17.0)
Immature Granulocytes: 1 %
Lymphocytes Relative: 32 %
Lymphs Abs: 1.4 10*3/uL (ref 0.7–4.0)
MCH: 22.9 pg — ABNORMAL LOW (ref 26.0–34.0)
MCHC: 31.1 g/dL (ref 30.0–36.0)
MCV: 73.6 fL — ABNORMAL LOW (ref 80.0–100.0)
Monocytes Absolute: 0.5 10*3/uL (ref 0.1–1.0)
Monocytes Relative: 12 %
Neutro Abs: 2.3 10*3/uL (ref 1.7–7.7)
Neutrophils Relative %: 53 %
Platelet Count: 518 10*3/uL — ABNORMAL HIGH (ref 150–400)
RBC: 4.02 MIL/uL — ABNORMAL LOW (ref 4.22–5.81)
RDW: 18.3 % — ABNORMAL HIGH (ref 11.5–15.5)
WBC Count: 4.3 10*3/uL (ref 4.0–10.5)
nRBC: 0 % (ref 0.0–0.2)

## 2024-02-17 MED ORDER — HEPARIN SOD (PORK) LOCK FLUSH 100 UNIT/ML IV SOLN
500.0000 [IU] | Freq: Once | INTRAVENOUS | Status: AC
  Administered 2024-02-17: 500 [IU] via INTRAVENOUS
  Filled 2024-02-17: qty 5

## 2024-02-17 MED ORDER — IRON SUCROSE 20 MG/ML IV SOLN
200.0000 mg | Freq: Once | INTRAVENOUS | Status: AC
  Administered 2024-02-17: 200 mg via INTRAVENOUS
  Filled 2024-02-17: qty 10

## 2024-02-17 MED ORDER — SODIUM CHLORIDE 0.9% FLUSH
10.0000 mL | Freq: Once | INTRAVENOUS | Status: AC | PRN
  Administered 2024-02-17: 10 mL
  Filled 2024-02-17: qty 10

## 2024-02-17 NOTE — Assessment & Plan Note (Addendum)
 Derek Webb 73 y.o. 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   #RUL of lung SCCa, atleast Stage IIIA (cT4N0M0)- -s/p bronchoscopy with EBUS by Dr. Tamea on 05/12/2022.   -Completed 5 cycles of CarboTaxol and RT on 07/27/2022. Cycle 6 was canceled due to persistent neutropenia; -Completed adjuvant Durvalumab  x 12 cycles on 07/12/2023.     CT JUNE 2025-  Scarring and radiation changes in the right upper lobe with volume loss in the right hemithorax. No recurrent or metastatic disease. Will repeat scan in 4 months- order at next visit.    # Iron  deficiency anemia: - Admitted from 8/29 to 04/29/2023-colonoscopy with Dr. Onita showed multiple polyps, small dark clot appearing in one of the ascending diverticuli which may have been the source of blood loss.  Upper endoscopy showed gastritis.  Pathology showed tubular adenoma.  VCE erosions in stomach that were seen on EGD.  Capsule did not reach colon.   -Hemoglobin today 9-   Will proceed with 1 dose of Venofer .     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  PSA level is normal at 3.14. -Follows with Dr. Penne   # Access - port placed on 05/28/2022  # DISPOSITION: # refer to vascular re: Thoracic aneurysm # venofer  today # q 2 weeks- venofer  x 2 more  # follow up in 2 months- MD; labs- cbc/bmp; iron  studies; possible venofer - Dr.B  # I reviewed the blood work- with the patient in detail; also reviewed the imaging independently [as summarized above]; and with the patient in detail.

## 2024-02-17 NOTE — Progress Notes (Signed)
 Survivorship Care Plan visit completed.  Treatment summary reviewed and given to patient.  ASCO answers booklet reviewed and given to patient.  CARE program and Cancer Transitions discussed with patient along with other resources cancer center offers to patients and caregivers.  Patient verbalized understanding.

## 2024-02-17 NOTE — Progress Notes (Signed)
 Patient had a CT scan on 02/03/2024. He has been doing ok.

## 2024-02-17 NOTE — Progress Notes (Signed)
 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 Luverne.  He was deemed not a surgical candidate and had transportation issues for radiation treatment.   Squamous cell lung cancer (HCC)  01/12/2022 Initial Diagnosis   Patient has been following with Dr. Fernand of pulmonary for suspicious lung nodule. CT chest in March 2021 showed post ablation scarring in LUL not changed and increased atelectasis of RML and RUL. He did not follow through.   CT chest in 01/12/2022 showed  New abrupt and slightly irregular cut off of the right upper lobe bronchus with associated right upper lobe atelectasis and postobstructive changes. Findings are concerning for endobronchial malignancy    04/08/2022 PET scan   IMPRESSION: 1. Hypermetabolic central right upper lobe lung mass nearly occluding the right upper lobe bronchus, poorly delineated on the noncontrast CT images, measuring approximately 3.8 x 2.9 cm, compatible with primary bronchogenic malignancy. 2. Postobstructive pneumonia/atelectasis throughout the basilar right upper lobe. 3. No hypermetabolic metastatic thoracic adenopathy or distant metastatic disease. 4. Stable mild post ablation change in the left upper lobe with no metabolic evidence of local tumor recurrence in this location. 5. Dilated 4.4 cm ascending thoracic aorta. Recommend annual imaging followup by CTA or MRA   05/12/2022  Procedure   Procedure was delayed due to miscommunication about holding Plavix . S/p bronchoscopy with EBUS by Dr. Tamea. Op note mentions significant bulky adenopathy in the subcarinal space and in the right hilum.    05/12/2022 Pathology Results   DIAGNOSIS:  A.  LUNG, RIGHT UPPER LOBE; ENB-ASSISTED BIOPSY:  - SQUAMOUS CELL CARCINOMA.   LUNG, RIGHT UPPER LOBE; EBUS-ASSISTED BRUSHING:  - POSITIVE FOR MALIGNANCY.  - SQUAMOUS CELL CARCINOMA IS PRESENT.   LUNG, RIGHT UPPER LOBE; EBUS-ASSISTED LAVAGE:  - POSITIVE FOR MALIGNANCY.  - SQUAMOUS CELL CARCINOMA IS PRESENT  SUBCARINAL SPACE, RIGHT; EBUS-ASSISTED FNA:  - POSITIVE FOR MALIGNANCY.  - SQUAMOUS CELL CARCINOMA IS PRESENT   05/18/2022 Cancer Staging   Staging form: Lung, AJCC 7th Edition - Clinical: Stage IIIA (T4, N0, M0) - Signed by Agrawal, Kavita, MD on 06/03/2022   05/21/2022 Imaging   MRI brain  No evidence of metastatic disease in the brain. 2. Small enhancing foci in the right parietal and right occipital calvarium, which are indeterminate but could represent small osseous metastases. Attention on follow-up   06/03/2022 - 07/27/2022 Chemotherapy   Patient is on Treatment Plan : LUNG Carboplatin  + Paclitaxel  + XRT q7d      08/03/2022 -  Chemotherapy   Patient is on Treatment Plan : LUNG NSCLC Durvalumab  (1500) q28d       Imaging   - CT chest with contrast (11/30/2022) was reviewed at the tumor conference.  There is interval development of marked interstitial disease and patchy airspace disease more confluent consolidative opacity in the peripheral right upper lobe progressive and interval.  In the lower lung there is ill-defined tree-in-bud nodularity with nodular areas measuring up to 1.4 cm in the posterior left lower lobe.  Features are suggestive of infectious and  inflammatory etiology however disease progression cannot be excluded.  Liver lesion is stable and likely hemangioma.  Patient continues to be asymptomatic.   Cycle 6 of Durvalumab  was held.    - Repeat CT chest with contrast (01/19/2023) showed interval improvement in the diffuse interstitial lung disease seen on prior study.  But there is still some persistent interstitial patchy area of consolidative airspace in the left upper lobe.  Ill-defined nodularity in the lower lobe has largely resolved although some component of dominant nodularity persist.  There is continued progression of volume loss in the right hemithorax with consolidative opacity in the right posterior apex and suprahilar right lung.  Right upper lobe airway is no longer patent.  Could be posttreatment related versus recurrent disease.   -PET/CT (02/08/2023) showed radiation changes in the right upper and medial left upper lobe.  No suspicious findings for residual or recurrent lung cancer.  Focal left prostate hypermetabolic some raising possibility of prostate cancer.  - MRI brain with no evidence of intracranial metastasis.  Unchanged subcentimeter enhancing focus in the right parietal calvarium nonspecific.   Primary cancer of right upper lobe of lung (HCC)  02/17/2024 Initial Diagnosis   Primary cancer of right upper lobe of lung (HCC)      HISTORY OF PRESENTING ILLNESS: Patient ambulating-independently. Accompanied by family-sister  Derek Webb 73 y.o.  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   Patient finished adjuvant durvalumab  November 2024.  Currently on surveillance.  Denied worsening shortness of breath or cough.  No headaches.  No nausea no vomiting.  Patient had a CT scan on 02/03/2024. He has been doing ok   Review of Systems  Constitutional:  Negative for chills, diaphoresis, fever,  malaise/fatigue and weight loss.  HENT:  Negative for nosebleeds and sore throat.   Eyes:  Negative for double vision.  Respiratory:  Negative for cough, hemoptysis, sputum production, shortness of breath and wheezing.   Cardiovascular:  Negative for chest pain, palpitations, orthopnea and leg swelling.  Gastrointestinal:  Negative for abdominal pain, blood in stool, constipation, diarrhea, heartburn, melena, nausea and vomiting.  Genitourinary:  Negative for dysuria, frequency and urgency.  Musculoskeletal:  Negative for back pain and joint pain.  Skin: Negative.  Negative for itching and rash.  Neurological:  Negative for dizziness, tingling, focal weakness, weakness and headaches.  Endo/Heme/Allergies:  Does not bruise/bleed easily.  Psychiatric/Behavioral:  Negative for depression. The patient is not nervous/anxious and does not have insomnia.     MEDICAL HISTORY:  Past Medical History:  Diagnosis Date   Alcoholism (HCC)    Allergy    Blood transfusion without reported diagnosis    COPD (chronic obstructive pulmonary disease) (HCC)    SEVERE COPD -OCCASIONALLY USES OXYGEN  AT NIGHT-DOES NOT USE INHALERS ON REGULAR BASIS   Elevated cholesterol    GERD (gastroesophageal reflux disease)    HOH (hard of hearing)    Hypertension    Lung cancer (HCC)    LEFT UPPER LUNG CARCINOMA-NOT A CANDIDATE FOR SURGICAL RESECTION BECAUSE OF HIS COPD AND POOR CANDIDATE FOR RADIATION BECAUSE OF TRANSPORTATION PROBLEMS   Oxygen  deficiency    Seizures (HCC)    CHRONIC DILATIN - PT STATES NO SEIZURES IN PAST COUPLE OF YEARS; PT STATES HIS  SEIZURES CAUSED NUMBNESS OF ARMS AND HANDS AND NOT ABLE TO MOVE HIS ARMS   Stroke (HCC)    2 TO 3 YRS AGO-AFFECTED HIS SPEECH--ABLE TO AMBULATE WITHOUT ASSIST AND DOES YARD WORK.  DECREASED HEARING IN BOTH EARS SINCE STROKE.   Vitamin D  deficiency     SURGICAL HISTORY: Past Surgical History:  Procedure Laterality Date   CARDIAC VALVE REPLACEMENT     CAROTID  SURGERY 2009 -LEFT     COLON POLYP EXCISION     COLON SURGERY     COLONOSCOPY WITH PROPOFOL  N/A 04/18/2017   Procedure: COLONOSCOPY WITH PROPOFOL ;  Surgeon: Viktoria Lamar DASEN, MD;  Location: [facility] ENDOSCOPY;  Service: Endoscopy;  Laterality: N/A;   COLONOSCOPY WITH PROPOFOL  N/A 04/26/2023   Procedure: COLONOSCOPY WITH PROPOFOL ;  Surgeon: Onita Elspeth Sharper, DO;  Location: [facility] ENDOSCOPY;  Service: Gastroenterology;  Laterality: N/A;   ESOPHAGOGASTRODUODENOSCOPY     ESOPHAGOGASTRODUODENOSCOPY (EGD) WITH PROPOFOL  N/A 04/23/2023   Procedure: ESOPHAGOGASTRODUODENOSCOPY (EGD) WITH PROPOFOL ;  Surgeon: Onita Elspeth Sharper, DO;  Location: [facility] ENDOSCOPY;  Service: Gastroenterology;  Laterality: N/A;   GIVENS CAPSULE STUDY  04/26/2023   Procedure: GIVENS CAPSULE STUDY;  Surgeon: Onita Elspeth Sharper, DO;  Location: [facility] ENDOSCOPY;  Service: Gastroenterology;;   IR GENERIC HISTORICAL  01/21/2016   IR RADIOLOGIST EVAL & MGMT 01/21/2016 Marcey Moan, MD [facility] INTERV RAD   IR GENERIC HISTORICAL  10/22/2014   IR RADIOLOGIST EVAL & MGMT 10/22/2014 Marcey Moan, MD [facility] INTERV RAD   IR IMAGING GUIDED PORT INSERTION  05/28/2022   IR RADIOLOGIST EVAL & MGMT  02/02/2017   IR RADIOLOGIST EVAL & MGMT  03/08/2018   percutaneous biopsy     POLYPECTOMY  04/26/2023   Procedure: POLYPECTOMY;  Surgeon: Onita Elspeth Sharper, DO;  Location: [facility] ENDOSCOPY;  Service: Gastroenterology;;   VIDEO BRONCHOSCOPY WITH ENDOBRONCHIAL ULTRASOUND Right 05/12/2022   Procedure: VIDEO BRONCHOSCOPY WITH ENDOBRONCHIAL ULTRASOUND;  Surgeon: Tamea Dedra CROME, MD;  Location: [facility] ORS;  Service: Cardiopulmonary;  Laterality: Right;    SOCIAL HISTORY: Social History   Socioeconomic History   Marital status: Single    Spouse name: Not on file   Number of children: Not on file   Years of education: Not on file   Highest education level: Not on file  Occupational History   Not on file  Tobacco Use   Smoking status:  Former    Current packs/day: 0.00    Average packs/day: 1 pack/day for 35.0 years (35.0 ttl pk-yrs)    Types: Cigarettes    Start date: 12/21/1976    Quit date: 12/22/2011    Years since quitting: 12.1   Smokeless tobacco: Never  Vaping Use   Vaping status: Never Used  Substance and Sexual Activity   Alcohol use: Yes    Comment: occosionally    Drug use: No   Sexual activity: Not on file  Other Topics Concern   Not on file  Social History Narrative   Not on file   Social Drivers of Health   Financial Resource Strain: Low Risk  (09/19/2023)   Overall Financial Resource Strain (CARDIA)    Difficulty of Paying Living Expenses: Not hard at all  Food Insecurity: No Food Insecurity (09/19/2023)   Hunger Vital Sign    Worried About Running Out of Food in the Last Year: Never true    Ran Out of Food in the Last Year: Never true  Transportation Needs: No Transportation Needs (09/19/2023)   PRAPARE - Transportation    Lack of  Transportation (Medical): No    Lack of Transportation (Non-Medical): No  Physical Activity: Unknown (09/19/2023)   Exercise Vital Sign    Days of Exercise per Week: 3 days    Minutes of Exercise per Session: Patient declined  Stress: No Stress Concern Present (09/19/2023)   [employer] of Occupational Health - Occupational Stress Questionnaire    Feeling of Stress : Not at all  Social Connections: Unknown (09/19/2023)   Social Connection and Isolation Panel    Frequency of Communication with Friends and Family: More than three times a week    Frequency of Social Gatherings with Friends and Family: More than three times a week    Attends Religious Services: Patient declined    Database administrator or Organizations: No    Attends Engineer, structural: Not on file    Marital Status: Never married  Intimate Partner Violence: Not At Risk (04/22/2023)   Humiliation, Afraid, Rape, and Kick questionnaire    Fear of Current or Ex-Partner: No    Emotionally  Abused: No    Physically Abused: No    Sexually Abused: No    FAMILY HISTORY: Family History  Problem Relation Age of Onset   Cancer Father    Stroke Father    Colon polyps Sister    Hearing loss Sister     ALLERGIES:  has no known allergies.  MEDICATIONS:  Current Outpatient Medications  Medication Sig Dispense Refill   albuterol  (VENTOLIN  HFA) 108 (90 Base) MCG/ACT inhaler Inhale 2 puffs into the lungs every 6 (six) hours as needed. 8 g 2   ascorbic acid  (VITAMIN C ) 500 MG tablet Take 1 tablet (500 mg total) by mouth daily. 30 tablet 1   aspirin 81 MG tablet Take 81 mg by mouth daily.     atorvastatin  (LIPITOR ) 80 MG tablet Take 1 tablet (80 mg total) by mouth daily. 90 tablet 3   budesonide -formoterol  (SYMBICORT ) 160-4.5 MCG/ACT inhaler Inhale 1 puff into the lungs 2 (two) times daily. 1 each 5   Cholecalciferol (VITAMIN D ) 2000 UNITS CAPS Take by mouth. VITAMIN D  3 2000 IU  ONE SOFTGEL DAILY  --OTC     clopidogrel  (PLAVIX ) 75 MG tablet Take 1 tablet (75 mg total) by mouth daily. 90 tablet 3   cyanocobalamin  1000 MCG tablet Take 1 tablet (1,000 mcg total) by mouth daily. 30 tablet 1   enalapril  (VASOTEC ) 10 MG tablet Take 1 tablet (10 mg total) by mouth daily. 90 tablet 0   ezetimibe  (ZETIA ) 10 MG tablet Take 1 tablet (10 mg total) by mouth daily. 90 tablet 3   iron  polysaccharides (NIFEREX) 150 MG capsule Take 1 capsule (150 mg total) by mouth daily. 30 capsule 1   pantoprazole  (PROTONIX ) 40 MG tablet Take 1 tablet (40 mg total) by mouth 2 (two) times daily before a meal. IN AM 180 tablet 3   phenytoin  (DILANTIN ) 100 MG ER capsule Take 4 capsules (400 mg total) by mouth at bedtime. 360 capsule 3   No current facility-administered medications for this visit.    PHYSICAL EXAMINATION:   Vitals:   02/17/24 1023  BP: 112/77  Pulse: 78  Resp: 18  Temp: 98.1 F (36.7 C)  SpO2: 98%   Filed Weights   02/17/24 1023  Weight: 181 lb (82.1 kg)    Physical Exam Vitals and  nursing note reviewed.  HENT:     Head: Normocephalic and atraumatic.     Mouth/Throat:     Pharynx: Oropharynx  is clear.   Eyes:     Extraocular Movements: Extraocular movements intact.     Pupils: Pupils are equal, round, and reactive to light.    Cardiovascular:     Rate and Rhythm: Normal rate and regular rhythm.  Pulmonary:     Comments: Decreased breath sounds bilaterally.  Abdominal:     Palpations: Abdomen is soft.   Musculoskeletal:        General: Normal range of motion.     Cervical back: Normal range of motion.   Skin:    General: Skin is warm.   Neurological:     General: No focal deficit present.     Mental Status: He is alert and oriented to person, place, and time.   Psychiatric:        Behavior: Behavior normal.        Judgment: Judgment normal.     LABORATORY DATA:  I have reviewed the data as listed Lab Results  Component Value Date   WBC 4.3 02/17/2024   HGB 9.2 (L) 02/17/2024   HCT 29.6 (L) 02/17/2024   MCV 73.6 (L) 02/17/2024   PLT 518 (H) 02/17/2024   Recent Labs    04/21/23 2238 04/23/23 0514 07/12/23 1023 09/07/23 1100 09/20/23 1013 02/17/24 1011  NA  --    < > 139  --  137 133*  K  --    < > 3.6  --  3.4* 4.3  CL  --    < > 103  --  102 101  CO2  --    < > 26  --  24 24  GLUCOSE  --    < > 96  --  84 70  BUN  --    < > 9  --  9 11  CREATININE  --    < > 0.80 0.70 0.58* 0.70  CALCIUM   --    < > 8.9  --  8.9 8.9  GFRNONAA  --    < > >60  --  >60 >60  PROT 6.5   < > 7.6  --  7.6 7.7  ALBUMIN 3.2*   < > 3.5  --  3.8 3.8  AST 13*   < > 13*  --  17 24  ALT 12   < > 13  --  18 26  ALKPHOS 93   < > 100  --  100 93  BILITOT 0.6   < > 0.2  --  0.6 0.7  BILIDIR <0.1  --   --   --   --   --   IBILI NOT CALCULATED  --   --   --   --   --    < > = values in this interval not displayed.    RADIOGRAPHIC STUDIES: I have personally reviewed the radiological images as listed and agreed with the findings in the report. CT Chest W  Contrast Result Date: 02/07/2024 EXAM: CT CHEST WITH CONTRAST 02/03/2024 11:31:09 AM TECHNIQUE: CT of the chest was performed with the administration of intravenous contrast. Multiplanar reformatted images are provided for review. Automated exposure control, iterative reconstruction, and/or weight based adjustment of the mA/kV was utilized to reduce the radiation dose to as low as reasonably achievable. COMPARISON: 09/07/2023 CLINICAL HISTORY: Non-small cell lung cancer (NSCLC), staging. Malignant neoplasm of unspecified part of unspecified. FINDINGS: MEDIASTINUM: Right cardiomediastinal shift. Right chest port terminates within the cavoatrial junction. Moderate coronary atherosclerosis of the LAD and left circumflex. 4.2  cm ascending thoracic aortic aneurysm, unchanged. Atherosclerotic calcification of the aortic arch. LYMPH NODES: Calcified portacaval node in the right upper abdomen, benign. LUNGS AND PLEURA: Scarring and radiation changes in the right upper lobe with volume loss in the right hemithorax. Additional scarring/radiation changes in the anterior left upper lobe. Moderate centrilobular and perirectal emphysematous changes, upper lung predominant. SOFT TISSUES/BONES: No acute abnormality of the bones or soft tissues. UPPER ABDOMEN: 15 mm hypervascular lesion in the central left hepatic lobe (image 136), favoring a benign hemangioma. IMPRESSION: 1. Scarring and radiation changes in the right upper lobe with volume loss in the right hemithorax. 2. No recurrent or metastatic disease. 3. 4.2 cm ascending thoracic aortic aneurysm, unchanged. Attention on follow-up is suggested. Electronically signed by: Pinkie Pebbles MD 02/07/2024 11:27 PM EDT RP Workstation: [id number]     Primary cancer of right upper lobe of lung (HCC) Derek Webb 73 y.o. 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   #RUL of lung SCCa, atleast Stage IIIA (cT4N0M0)- -s/p bronchoscopy with EBUS by Dr. Tamea on 05/12/2022.   -Completed 5 cycles of CarboTaxol and RT on 07/27/2022. Cycle 6 was canceled due to persistent neutropenia; -Completed adjuvant Durvalumab  x 12 cycles on 07/12/2023.     CT JUNE 2025-  Scarring and radiation changes in the right upper lobe with volume loss in the right hemithorax. No recurrent or metastatic disease. Will repeat scan in 4 months- order at next visit.    # Iron  deficiency anemia: - Admitted from 8/29 to 04/29/2023-colonoscopy with Dr. Onita showed multiple polyps, small dark clot appearing in one of the ascending diverticuli which may have been the source of blood loss.  Upper endoscopy showed gastritis.  Pathology showed tubular adenoma.  VCE erosions in stomach that were seen on EGD.  Capsule did not reach colon.   -Hemoglobin today 9-   Will proceed with 1 dose of Venofer .     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  PSA level is normal at 3.14. -Follows with Dr. Penne   # Access - port placed on 05/28/2022  # DISPOSITION: # refer to vascular re: Thoracic aneurysm # venofer  today # q 2 weeks- venofer  x 2 more  # follow up in 2 months- MD; labs- cbc/bmp; iron  studies; possible venofer - Dr.B  # I reviewed the blood work- with the patient in detail; also reviewed the imaging independently [as summarized above]; and with the patient in detail.      Above plan of care was discussed with patient/family in detail.  My contact information was given to the patient/family.       Cindy JONELLE Joe, MD 02/17/2024 11:21 AM

## 2024-02-21 ENCOUNTER — Other Ambulatory Visit: Payer: Self-pay

## 2024-02-21 DIAGNOSIS — J449 Chronic obstructive pulmonary disease, unspecified: Secondary | ICD-10-CM | POA: Diagnosis not present

## 2024-02-23 DIAGNOSIS — S61402A Unspecified open wound of left hand, initial encounter: Secondary | ICD-10-CM | POA: Diagnosis not present

## 2024-02-28 ENCOUNTER — Encounter: Payer: Self-pay | Admitting: Internal Medicine

## 2024-02-29 ENCOUNTER — Encounter: Payer: Self-pay | Admitting: Internal Medicine

## 2024-03-05 ENCOUNTER — Inpatient Hospital Stay

## 2024-03-06 ENCOUNTER — Inpatient Hospital Stay: Attending: Internal Medicine

## 2024-03-06 ENCOUNTER — Encounter: Payer: Self-pay | Admitting: Internal Medicine

## 2024-03-08 ENCOUNTER — Telehealth: Payer: Self-pay | Admitting: Acute Care

## 2024-03-08 NOTE — Telephone Encounter (Signed)
 Created in error.   Patient's sister, Derek Webb, called in and left VM for Lung Cancer Screening regarding pt, Derek Webb. When I called Derek Webb back I advised her there was no documentation in the pts chart indicating we called the pt. Derek Webb became frustrated and states she received a call from our number but isnt able to listen to her VMs. I questioned if the call could've been for her, she was reluctant to give any information to me as she did not want me to look in her chart to see if she was referred to us . Ultimately, Derek Webb gave her information, she was referred to Lung Cancer Screening, not [name]. Nothing further needed regarding [name].

## 2024-03-09 ENCOUNTER — Ambulatory Visit (INDEPENDENT_AMBULATORY_CARE_PROVIDER_SITE_OTHER): Admitting: Vascular Surgery

## 2024-03-09 ENCOUNTER — Encounter (INDEPENDENT_AMBULATORY_CARE_PROVIDER_SITE_OTHER): Payer: Self-pay | Admitting: Vascular Surgery

## 2024-03-09 VITALS — BP 94/68 | HR 67 | Resp 18 | Ht 68.0 in | Wt 178.8 lb

## 2024-03-09 DIAGNOSIS — I10 Essential (primary) hypertension: Secondary | ICD-10-CM

## 2024-03-09 DIAGNOSIS — I7121 Aneurysm of the ascending aorta, without rupture: Secondary | ICD-10-CM

## 2024-03-09 DIAGNOSIS — C3411 Malignant neoplasm of upper lobe, right bronchus or lung: Secondary | ICD-10-CM | POA: Diagnosis not present

## 2024-03-09 DIAGNOSIS — I712 Thoracic aortic aneurysm, without rupture, unspecified: Secondary | ICD-10-CM | POA: Insufficient documentation

## 2024-03-09 DIAGNOSIS — E782 Mixed hyperlipidemia: Secondary | ICD-10-CM

## 2024-03-09 NOTE — Progress Notes (Signed)
 Patient ID: Derek Webb, male   DOB: May 21, 1951, 73 y.o.   MRN: [id number]  Chief Complaint  Patient presents with   Establish Care    New patient consult, thoracic aneurysm. Ref.Brahmanday    HPI Derek Webb is a 73 y.o. male.  I am asked to see the patient by Dr.Brahmanday for evaluation of of an ascending thoracic aortic aneurysm seen incidentally on his CT scan of the chest for lung cancer.  He had no previous knowledge of aneurysm.  He has no aneurysm related symptoms such as chest pain, back pain, or signs of peripheral embolization.  I have independently reviewed his CT scan, and he does have a 4.2 cm ascending thoracic aortic aneurysm.SABRA     Past Medical History:  Diagnosis Date   Alcoholism (HCC)    Allergy    Blood transfusion without reported diagnosis    COPD (chronic obstructive pulmonary disease) (HCC)    SEVERE COPD -OCCASIONALLY USES OXYGEN  AT NIGHT-DOES NOT USE INHALERS ON REGULAR BASIS   Elevated cholesterol    GERD (gastroesophageal reflux disease)    HOH (hard of hearing)    Hypertension    Lung cancer (HCC)    LEFT UPPER LUNG CARCINOMA-NOT A CANDIDATE FOR SURGICAL RESECTION BECAUSE OF HIS COPD AND POOR CANDIDATE FOR RADIATION BECAUSE OF TRANSPORTATION PROBLEMS   Oxygen  deficiency    Seizures (HCC)    CHRONIC DILATIN - PT STATES NO SEIZURES IN PAST COUPLE OF YEARS; PT STATES HIS SEIZURES CAUSED NUMBNESS OF ARMS AND HANDS AND NOT ABLE TO MOVE HIS ARMS   Stroke (HCC)    2 TO 3 YRS AGO-AFFECTED HIS SPEECH--ABLE TO AMBULATE WITHOUT ASSIST AND DOES YARD WORK.  DECREASED HEARING IN BOTH EARS SINCE STROKE.   Vitamin D  deficiency     Past Surgical History:  Procedure Laterality Date   CARDIAC VALVE REPLACEMENT     CAROTID SURGERY 2009 -LEFT     COLON POLYP EXCISION     COLON SURGERY     COLONOSCOPY WITH PROPOFOL  N/A 04/18/2017   Procedure: COLONOSCOPY WITH PROPOFOL ;  Surgeon: Viktoria Lamar DASEN, MD;  Location: [facility] ENDOSCOPY;  Service: Endoscopy;   Laterality: N/A;   COLONOSCOPY WITH PROPOFOL  N/A 04/26/2023   Procedure: COLONOSCOPY WITH PROPOFOL ;  Surgeon: Onita Elspeth Sharper, DO;  Location: [facility] ENDOSCOPY;  Service: Gastroenterology;  Laterality: N/A;   ESOPHAGOGASTRODUODENOSCOPY     ESOPHAGOGASTRODUODENOSCOPY (EGD) WITH PROPOFOL  N/A 04/23/2023   Procedure: ESOPHAGOGASTRODUODENOSCOPY (EGD) WITH PROPOFOL ;  Surgeon: Onita Elspeth Sharper, DO;  Location: [facility] ENDOSCOPY;  Service: Gastroenterology;  Laterality: N/A;   GIVENS CAPSULE STUDY  04/26/2023   Procedure: GIVENS CAPSULE STUDY;  Surgeon: Onita Elspeth Sharper, DO;  Location: [facility] ENDOSCOPY;  Service: Gastroenterology;;   IR GENERIC HISTORICAL  01/21/2016   IR RADIOLOGIST EVAL & MGMT 01/21/2016 Marcey Moan, MD [facility] INTERV RAD   IR GENERIC HISTORICAL  10/22/2014   IR RADIOLOGIST EVAL & MGMT 10/22/2014 Marcey Moan, MD [facility] INTERV RAD   IR IMAGING GUIDED PORT INSERTION  05/28/2022   IR RADIOLOGIST EVAL & MGMT  02/02/2017   IR RADIOLOGIST EVAL & MGMT  03/08/2018   percutaneous biopsy     POLYPECTOMY  04/26/2023   Procedure: POLYPECTOMY;  Surgeon: Onita Elspeth Sharper, DO;  Location: [facility] ENDOSCOPY;  Service: Gastroenterology;;   VIDEO BRONCHOSCOPY WITH ENDOBRONCHIAL ULTRASOUND Right 05/12/2022   Procedure: VIDEO BRONCHOSCOPY WITH ENDOBRONCHIAL ULTRASOUND;  Surgeon: Tamea Dedra CROME, MD;  Location: [facility] ORS;  Service: Cardiopulmonary;  Laterality: Right;  Family History  Problem Relation Age of Onset   Cancer Father    Stroke Father    Colon polyps Sister    Hearing loss Sister       Social History   Tobacco Use   Smoking status: Former    Current packs/day: 0.00    Average packs/day: 1 pack/day for 35.0 years (35.0 ttl pk-yrs)    Types: Cigarettes    Start date: 12/21/1976    Quit date: 12/22/2011    Years since quitting: 12.2   Smokeless tobacco: Never  Vaping Use   Vaping status: Never Used  Substance Use Topics   Alcohol use: Yes    Comment:  occosionally    Drug use: No     No Known Allergies  Current Outpatient Medications  Medication Sig Dispense Refill   albuterol  (VENTOLIN  HFA) 108 (90 Base) MCG/ACT inhaler Inhale 2 puffs into the lungs every 6 (six) hours as needed. 8 g 2   ascorbic acid  (VITAMIN C ) 500 MG tablet Take 1 tablet (500 mg total) by mouth daily. 30 tablet 1   aspirin 81 MG tablet Take 81 mg by mouth daily.     atorvastatin  (LIPITOR ) 80 MG tablet Take 1 tablet (80 mg total) by mouth daily. 90 tablet 3   budesonide -formoterol  (SYMBICORT ) 160-4.5 MCG/ACT inhaler Inhale 1 puff into the lungs 2 (two) times daily. 1 each 5   Cholecalciferol (VITAMIN D ) 2000 UNITS CAPS Take by mouth. VITAMIN D  3 2000 IU  ONE SOFTGEL DAILY  --OTC     clopidogrel  (PLAVIX ) 75 MG tablet Take 1 tablet (75 mg total) by mouth daily. 90 tablet 3   cyanocobalamin  1000 MCG tablet Take 1 tablet (1,000 mcg total) by mouth daily. 30 tablet 1   enalapril  (VASOTEC ) 10 MG tablet Take 1 tablet (10 mg total) by mouth daily. 90 tablet 0   ezetimibe  (ZETIA ) 10 MG tablet Take 1 tablet (10 mg total) by mouth daily. 90 tablet 3   iron  polysaccharides (NIFEREX) 150 MG capsule Take 1 capsule (150 mg total) by mouth daily. 30 capsule 1   pantoprazole  (PROTONIX ) 40 MG tablet Take 1 tablet (40 mg total) by mouth 2 (two) times daily before a meal. IN AM 180 tablet 3   phenytoin  (DILANTIN ) 100 MG ER capsule Take 4 capsules (400 mg total) by mouth at bedtime. 360 capsule 3   No current facility-administered medications for this visit.      REVIEW OF SYSTEMS (Negative unless checked)  Constitutional: [] Weight loss  [] Fever  [] Chills Cardiac: [] Chest pain   [] Chest pressure   [] Palpitations   [] Shortness of breath when laying flat   [] Shortness of breath at rest   [x] Shortness of breath with exertion. Vascular:  [] Pain in legs with walking   [] Pain in legs at rest   [] Pain in legs when laying flat   [] Claudication   [] Pain in feet when walking  [] Pain in feet  at rest  [] Pain in feet when laying flat   [] History of DVT   [] Phlebitis   [] Swelling in legs   [] Varicose veins   [] Non-healing ulcers Pulmonary:   [] Uses home oxygen    [] Productive cough   [] Hemoptysis   [] Wheeze  [x] COPD   [] Asthma Neurologic:  [] Dizziness  [] Blackouts   [x] Seizures   [x] History of stroke   [] History of TIA  [] Aphasia   [] Temporary blindness   [] Dysphagia   [] Weakness or numbness in arms   [] Weakness or numbness in legs Musculoskeletal:  [x] Arthritis   []   Joint swelling   [] Joint pain   [] Low back pain Hematologic:  [] Easy bruising  [] Easy bleeding   [] Hypercoagulable state   [] Anemic  [] Hepatitis Gastrointestinal:  [] Blood in stool   [] Vomiting blood  [x] Gastroesophageal reflux/heartburn   [] Abdominal pain Genitourinary:  [] Chronic kidney disease   [] Difficult urination  [] Frequent urination  [] Burning with urination   [] Hematuria Skin:  [] Rashes   [] Ulcers   [] Wounds Psychological:  [] History of anxiety   []  History of major depression.    Physical Exam BP 94/68 (BP Location: Left Arm, Patient Position: Sitting, Cuff Size: Normal)   Pulse 67   Resp 18   Ht 5' 8 (1.727 m)   Wt 178 lb 12.8 oz (81.1 kg)   BMI 27.19 kg/m  Gen:  WD/WN, NAD. Appears older than stated age Head: [place]/AT, No temporalis wasting.  Ear/Nose/Throat: Hearing grossly intact, nares w/o erythema or drainage, oropharynx w/o Erythema/Exudate Eyes: Conjunctiva clear, sclera non-icteric  Neck: trachea midline.  No JVD.  Pulmonary:  Good air movement, respirations not labored, no use of accessory muscles  Cardiac: RRR, no JVD Vascular:  Vessel Right Left  Radial Palpable Palpable                                   Gastrointestinal:. No masses, surgical incisions, or scars. Musculoskeletal: M/S 5/5 throughout.  Extremities without ischemic changes.  No deformity or atrophy. No edema. Neurologic: Sensation grossly intact in extremities.  Symmetrical.  Speech is fluent. Motor exam as listed  above. Psychiatric: Judgment and insight appear fair at best. Wife provide much of the history Dermatologic: No rashes or ulcers noted.  No cellulitis or open wounds.    Radiology No results found.  Labs Recent Results (from the past 2160 hours)  CBC with Differential (Cancer Center Only)     Status: Abnormal   Collection Time: 02/17/24 10:11 AM  Result Value Ref Range   WBC Count 4.3 4.0 - 10.5 K/uL   RBC 4.02 (L) 4.22 - 5.81 MIL/uL   Hemoglobin 9.2 (L) 13.0 - 17.0 g/dL    Comment: Reticulocyte Hemoglobin testing may be clinically indicated, consider ordering this additional test [id number]    HCT 29.6 (L) 39.0 - 52.0 %   MCV 73.6 (L) 80.0 - 100.0 fL   MCH 22.9 (L) 26.0 - 34.0 pg   MCHC 31.1 30.0 - 36.0 g/dL   RDW 81.6 (H) 88.4 - 84.4 %   Platelet Count 518 (H) 150 - 400 K/uL   nRBC 0.0 0.0 - 0.2 %   Neutrophils Relative % 53 %   Neutro Abs 2.3 1.7 - 7.7 K/uL   Lymphocytes Relative 32 %   Lymphs Abs 1.4 0.7 - 4.0 K/uL   Monocytes Relative 12 %   Monocytes Absolute 0.5 0.1 - 1.0 K/uL   Eosinophils Relative 1 %   Eosinophils Absolute 0.1 0.0 - 0.5 K/uL   Basophils Relative 1 %   Basophils Absolute 0.0 0.0 - 0.1 K/uL   Immature Granulocytes 1 %   Abs Immature Granulocytes 0.02 0.00 - 0.07 K/uL    Comment: Performed at [facility], [address] [place], [place] [address]  CMP (Cancer Center only)     Status: Abnormal   Collection Time: 02/17/24 10:11 AM  Result Value Ref Range   Sodium 133 (L) 135 - 145 mmol/L   Potassium 4.3 3.5 - 5.1 mmol/L   Chloride 101 98 -  111 mmol/L   CO2 24 22 - 32 mmol/L   Glucose, Bld 70 70 - 99 mg/dL    Comment: Glucose reference range applies only to samples taken after fasting for at least 8 hours.   BUN 11 8 - 23 mg/dL   Creatinine 9.29 9.38 - 1.24 mg/dL   Calcium  8.9 8.9 - 10.3 mg/dL   Total Protein 7.7 6.5 - 8.1 g/dL   Albumin 3.8 3.5 - 5.0 g/dL   AST 24 15 - 41 U/L   ALT 26 0 - 44 U/L   Alkaline Phosphatase 93 38 - 126  U/L   Total Bilirubin 0.7 0.0 - 1.2 mg/dL   GFR, Estimated >39 >39 mL/min    Comment: (NOTE) Calculated using the CKD-EPI Creatinine Equation (2021)    Anion gap 8 5 - 15    Comment: Performed at [facility], [address] [place], [place] [address]    Assessment/Plan:  Aneurysm of thoracic aorta (HCC)  I have independently reviewed his CT scan, and he does have a 4.2 cm ascending thoracic aortic aneurysm.  This does not pose him any immediate risk, but will need to be followed with CT scan.  I would generally do that on an annual basis, but he will be getting his CT scans for oncologic reasons more frequently.  These will be adequate for my evaluation.  I will see him in 1 year.  Essential hypertension blood pressure control important in reducing the progression of atherosclerotic disease and aneurysmal growth. On appropriate oral medications.   Primary cancer of right upper lobe of lung (HCC)  sounds fairly advanced.  Follows with oncology.  Hyperlipidemia lipid control important in reducing the progression of atherosclerotic disease. Continue statin therapy      Selinda Gu 03/09/2024, 12:46 PM   This note was created with Dragon medical transcription system.  Any errors from dictation are unintentional.

## 2024-03-09 NOTE — Assessment & Plan Note (Signed)
 lipid control important in reducing the progression of atherosclerotic disease. Continue statin therapy

## 2024-03-09 NOTE — Assessment & Plan Note (Signed)
 I have independently reviewed his CT scan, and he does have a 4.2 cm ascending thoracic aortic aneurysm.  This does not pose him any immediate risk, but will need to be followed with CT scan.  I would generally do that on an annual basis, but he will be getting his CT scans for oncologic reasons more frequently.  These will be adequate for my evaluation.  I will see him in 1 year.

## 2024-03-09 NOTE — Assessment & Plan Note (Signed)
 sounds fairly advanced.  Follows with oncology.

## 2024-03-09 NOTE — Assessment & Plan Note (Signed)
blood pressure control important in reducing the progression of atherosclerotic disease and aneurysmal growth. On appropriate oral medications.  

## 2024-03-10 ENCOUNTER — Other Ambulatory Visit: Payer: Self-pay

## 2024-03-13 ENCOUNTER — Encounter: Payer: Self-pay | Admitting: Internal Medicine

## 2024-03-13 LAB — PULMONARY FUNCTION TEST

## 2024-03-19 ENCOUNTER — Inpatient Hospital Stay

## 2024-03-20 ENCOUNTER — Inpatient Hospital Stay

## 2024-03-20 ENCOUNTER — Inpatient Hospital Stay: Attending: Internal Medicine

## 2024-03-20 VITALS — BP 116/68 | HR 60 | Temp 98.2°F | Resp 16

## 2024-03-20 DIAGNOSIS — C3490 Malignant neoplasm of unspecified part of unspecified bronchus or lung: Secondary | ICD-10-CM

## 2024-03-20 DIAGNOSIS — D509 Iron deficiency anemia, unspecified: Secondary | ICD-10-CM | POA: Diagnosis not present

## 2024-03-20 MED ORDER — SODIUM CHLORIDE 0.9% FLUSH
10.0000 mL | Freq: Once | INTRAVENOUS | Status: AC | PRN
Start: 2024-03-20 — End: 2024-03-20
  Administered 2024-03-20: 10 mL
  Filled 2024-03-20: qty 10

## 2024-03-20 MED ORDER — HEPARIN SOD (PORK) LOCK FLUSH 100 UNIT/ML IV SOLN
500.0000 [IU] | Freq: Once | INTRAVENOUS | Status: AC | PRN
  Administered 2024-03-20: 500 [IU]
  Filled 2024-03-20: qty 5

## 2024-03-20 MED ORDER — IRON SUCROSE 20 MG/ML IV SOLN
200.0000 mg | Freq: Once | INTRAVENOUS | Status: AC
  Administered 2024-03-20: 200 mg via INTRAVENOUS
  Filled 2024-03-20: qty 10

## 2024-03-20 NOTE — Patient Instructions (Signed)

## 2024-03-22 ENCOUNTER — Inpatient Hospital Stay

## 2024-03-23 ENCOUNTER — Inpatient Hospital Stay: Attending: Internal Medicine

## 2024-03-23 VITALS — BP 123/76 | HR 67 | Temp 97.9°F | Resp 16

## 2024-03-23 DIAGNOSIS — D509 Iron deficiency anemia, unspecified: Secondary | ICD-10-CM | POA: Diagnosis not present

## 2024-03-23 DIAGNOSIS — Z87891 Personal history of nicotine dependence: Secondary | ICD-10-CM | POA: Diagnosis not present

## 2024-03-23 DIAGNOSIS — J449 Chronic obstructive pulmonary disease, unspecified: Secondary | ICD-10-CM | POA: Diagnosis not present

## 2024-03-23 DIAGNOSIS — C3490 Malignant neoplasm of unspecified part of unspecified bronchus or lung: Secondary | ICD-10-CM

## 2024-03-23 MED ORDER — IRON SUCROSE 20 MG/ML IV SOLN
200.0000 mg | Freq: Once | INTRAVENOUS | Status: AC
  Administered 2024-03-23: 200 mg via INTRAVENOUS
  Filled 2024-03-23: qty 10

## 2024-04-19 ENCOUNTER — Inpatient Hospital Stay: Admitting: Internal Medicine

## 2024-04-19 ENCOUNTER — Inpatient Hospital Stay

## 2024-04-20 ENCOUNTER — Inpatient Hospital Stay

## 2024-04-20 ENCOUNTER — Encounter: Payer: Self-pay | Admitting: Internal Medicine

## 2024-04-20 ENCOUNTER — Inpatient Hospital Stay (HOSPITAL_BASED_OUTPATIENT_CLINIC_OR_DEPARTMENT_OTHER): Admitting: Internal Medicine

## 2024-04-20 VITALS — BP 109/72 | HR 80 | Temp 98.0°F | Resp 18 | Ht 68.0 in | Wt 181.0 lb

## 2024-04-20 DIAGNOSIS — D509 Iron deficiency anemia, unspecified: Secondary | ICD-10-CM | POA: Diagnosis not present

## 2024-04-20 DIAGNOSIS — Z87891 Personal history of nicotine dependence: Secondary | ICD-10-CM | POA: Diagnosis not present

## 2024-04-20 DIAGNOSIS — C3411 Malignant neoplasm of upper lobe, right bronchus or lung: Secondary | ICD-10-CM

## 2024-04-20 LAB — IRON AND TIBC
Iron: 71 ug/dL (ref 45–182)
Saturation Ratios: 27 % (ref 17.9–39.5)
TIBC: 259 ug/dL (ref 250–450)
UIBC: 188 ug/dL

## 2024-04-20 LAB — CBC WITH DIFFERENTIAL (CANCER CENTER ONLY)
Abs Immature Granulocytes: 0.01 K/uL (ref 0.00–0.07)
Basophils Absolute: 0 K/uL (ref 0.0–0.1)
Basophils Relative: 1 %
Eosinophils Absolute: 0.1 K/uL (ref 0.0–0.5)
Eosinophils Relative: 2 %
HCT: 34.3 % — ABNORMAL LOW (ref 39.0–52.0)
Hemoglobin: 11.2 g/dL — ABNORMAL LOW (ref 13.0–17.0)
Immature Granulocytes: 0 %
Lymphocytes Relative: 37 %
Lymphs Abs: 1.5 K/uL (ref 0.7–4.0)
MCH: 26 pg (ref 26.0–34.0)
MCHC: 32.7 g/dL (ref 30.0–36.0)
MCV: 79.8 fL — ABNORMAL LOW (ref 80.0–100.0)
Monocytes Absolute: 0.5 K/uL (ref 0.1–1.0)
Monocytes Relative: 13 %
Neutro Abs: 1.9 K/uL (ref 1.7–7.7)
Neutrophils Relative %: 47 %
Platelet Count: 311 K/uL (ref 150–400)
RBC: 4.3 MIL/uL (ref 4.22–5.81)
RDW: 25.5 % — ABNORMAL HIGH (ref 11.5–15.5)
WBC Count: 3.9 K/uL — ABNORMAL LOW (ref 4.0–10.5)
nRBC: 0 % (ref 0.0–0.2)

## 2024-04-20 LAB — BASIC METABOLIC PANEL WITH GFR
Anion gap: 8 (ref 5–15)
BUN: 10 mg/dL (ref 8–23)
CO2: 25 mmol/L (ref 22–32)
Calcium: 9.1 mg/dL (ref 8.9–10.3)
Chloride: 103 mmol/L (ref 98–111)
Creatinine, Ser: 0.74 mg/dL (ref 0.61–1.24)
GFR, Estimated: >60 mL/min (ref >60)
Glucose, Bld: 101 mg/dL — ABNORMAL HIGH (ref 70–99)
Potassium: 3.7 mmol/L (ref 3.5–5.1)
Sodium: 136 mmol/L (ref 135–145)

## 2024-04-20 NOTE — Progress Notes (Signed)
 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 Luverne.  He was deemed not a surgical candidate and had transportation issues for radiation treatment.   Squamous cell lung cancer (HCC)  01/12/2022 Initial Diagnosis   Patient has been following with Dr. Fernand of pulmonary for suspicious lung nodule. CT chest in March 2021 showed post ablation scarring in LUL not changed and increased atelectasis of RML and RUL. He did not follow through.   CT chest in 01/12/2022 showed  New abrupt and slightly irregular cut off of the right upper lobe bronchus with associated right upper lobe atelectasis and postobstructive changes. Findings are concerning for endobronchial malignancy    04/08/2022 PET scan   IMPRESSION: 1. Hypermetabolic central right upper lobe lung mass nearly occluding the right upper lobe bronchus, poorly delineated on the noncontrast CT images, measuring approximately 3.8 x 2.9 cm, compatible with primary bronchogenic malignancy. 2. Postobstructive pneumonia/atelectasis throughout the basilar right upper lobe. 3. No hypermetabolic metastatic thoracic adenopathy or distant metastatic disease. 4. Stable mild post ablation change in the left upper lobe with no metabolic evidence of local tumor recurrence in this location. 5. Dilated 4.4 cm ascending thoracic aorta. Recommend annual imaging followup by CTA or MRA   05/12/2022  Procedure   Procedure was delayed due to miscommunication about holding Plavix . S/p bronchoscopy with EBUS by Dr. Tamea. Op note mentions significant bulky adenopathy in the subcarinal space and in the right hilum.    05/12/2022 Pathology Results   DIAGNOSIS:  A.  LUNG, RIGHT UPPER LOBE; ENB-ASSISTED BIOPSY:  - SQUAMOUS CELL CARCINOMA.   LUNG, RIGHT UPPER LOBE; EBUS-ASSISTED BRUSHING:  - POSITIVE FOR MALIGNANCY.  - SQUAMOUS CELL CARCINOMA IS PRESENT.   LUNG, RIGHT UPPER LOBE; EBUS-ASSISTED LAVAGE:  - POSITIVE FOR MALIGNANCY.  - SQUAMOUS CELL CARCINOMA IS PRESENT  SUBCARINAL SPACE, RIGHT; EBUS-ASSISTED FNA:  - POSITIVE FOR MALIGNANCY.  - SQUAMOUS CELL CARCINOMA IS PRESENT   05/18/2022 Cancer Staging   Staging form: Lung, AJCC 7th Edition - Clinical: Stage IIIA (T4, N0, M0) - Signed by Agrawal, Kavita, MD on 06/03/2022   05/21/2022 Imaging   MRI brain  No evidence of metastatic disease in the brain. 2. Small enhancing foci in the right parietal and right occipital calvarium, which are indeterminate but could represent small osseous metastases. Attention on follow-up   06/03/2022 - 07/27/2022 Chemotherapy   Patient is on Treatment Plan : LUNG Carboplatin  + Paclitaxel  + XRT q7d      08/03/2022 -  Chemotherapy   Patient is on Treatment Plan : LUNG NSCLC Durvalumab  (1500) q28d       Imaging   - CT chest with contrast (11/30/2022) was reviewed at the tumor conference.  There is interval development of marked interstitial disease and patchy airspace disease more confluent consolidative opacity in the peripheral right upper lobe progressive and interval.  In the lower lung there is ill-defined tree-in-bud nodularity with nodular areas measuring up to 1.4 cm in the posterior left lower lobe.  Features are suggestive of infectious and  inflammatory etiology however disease progression cannot be excluded.  Liver lesion is stable and likely hemangioma.  Patient continues to be asymptomatic.   Cycle 6 of Durvalumab  was held.    - Repeat CT chest with contrast (01/19/2023) showed interval improvement in the diffuse interstitial lung disease seen on prior study.  But there is still some persistent interstitial patchy area of consolidative airspace in the left upper lobe.  Ill-defined nodularity in the lower lobe has largely resolved although some component of dominant nodularity persist.  There is continued progression of volume loss in the right hemithorax with consolidative opacity in the right posterior apex and suprahilar right lung.  Right upper lobe airway is no longer patent.  Could be posttreatment related versus recurrent disease.   -PET/CT (02/08/2023) showed radiation changes in the right upper and medial left upper lobe.  No suspicious findings for residual or recurrent lung cancer.  Focal left prostate hypermetabolic some raising possibility of prostate cancer.  - MRI brain with no evidence of intracranial metastasis.  Unchanged subcentimeter enhancing focus in the right parietal calvarium nonspecific.   Primary cancer of right upper lobe of lung (HCC)  02/17/2024 Initial Diagnosis   Primary cancer of right upper lobe of lung (HCC)   02/17/2024 Cancer Staging   Staging form: Lung, AJCC V9 - Clinical: Stage IIIA (cT4, cN0, cM0) - Signed by Rennie Cindy SAUNDERS, MD on 02/17/2024      HISTORY OF PRESENTING ILLNESS: Patient ambulating-independently. Accompanied by family-sister  [name] 73 y.o.  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 .   Patient denies pain. Denies fatigue. Appetite is good. Denies any cough or dyspnea. No blood noted in stool. No dizziness. Sleeps well at  night.   Review of Systems  Constitutional:  Negative for chills, diaphoresis, fever, malaise/fatigue and weight loss.  HENT:  Negative for nosebleeds and sore throat.   Eyes:  Negative for double vision.  Respiratory:  Negative for cough, hemoptysis, sputum production, shortness of breath and wheezing.   Cardiovascular:  Negative for chest pain, palpitations, orthopnea and leg swelling.  Gastrointestinal:  Negative for abdominal pain, blood in stool, constipation, diarrhea, heartburn, melena, nausea and vomiting.  Genitourinary:  Negative for dysuria, frequency and urgency.  Musculoskeletal:  Negative for back pain and joint pain.  Skin: Negative.  Negative for itching and rash.  Neurological:  Negative for dizziness, tingling, focal weakness, weakness and headaches.  Endo/Heme/Allergies:  Does not bruise/bleed easily.  Psychiatric/Behavioral:  Negative for depression. The patient is not nervous/anxious and does not have insomnia.     MEDICAL HISTORY:  Past Medical History:  Diagnosis Date   Alcoholism (HCC)    Allergy    Blood transfusion without reported diagnosis    COPD (chronic obstructive pulmonary disease) (HCC)    SEVERE COPD -OCCASIONALLY USES OXYGEN  AT NIGHT-DOES NOT USE INHALERS ON REGULAR BASIS   Elevated cholesterol    GERD (gastroesophageal reflux disease)    HOH (hard of hearing)    Hypertension    Lung cancer (HCC)    LEFT UPPER LUNG CARCINOMA-NOT A CANDIDATE FOR SURGICAL RESECTION BECAUSE OF HIS COPD AND POOR CANDIDATE FOR RADIATION BECAUSE OF TRANSPORTATION PROBLEMS   Oxygen  deficiency  Seizures (HCC)    CHRONIC DILATIN - PT STATES NO SEIZURES IN PAST COUPLE OF YEARS; PT STATES HIS SEIZURES CAUSED NUMBNESS OF ARMS AND HANDS AND NOT ABLE TO MOVE HIS ARMS   Stroke (HCC)    2 TO 3 YRS AGO-AFFECTED HIS SPEECH--ABLE TO AMBULATE WITHOUT ASSIST AND DOES YARD WORK.  DECREASED HEARING IN BOTH EARS SINCE STROKE.   Vitamin D  deficiency     SURGICAL HISTORY: Past  Surgical History:  Procedure Laterality Date   CARDIAC VALVE REPLACEMENT     CAROTID SURGERY 2009 -LEFT     COLON POLYP EXCISION     COLON SURGERY     COLONOSCOPY WITH PROPOFOL  N/A 04/18/2017   Procedure: COLONOSCOPY WITH PROPOFOL ;  Surgeon: Viktoria Lamar DASEN, MD;  Location: [facility] ENDOSCOPY;  Service: Endoscopy;  Laterality: N/A;   COLONOSCOPY WITH PROPOFOL  N/A 04/26/2023   Procedure: COLONOSCOPY WITH PROPOFOL ;  Surgeon: Onita Elspeth Sharper, DO;  Location: [facility] ENDOSCOPY;  Service: Gastroenterology;  Laterality: N/A;   ESOPHAGOGASTRODUODENOSCOPY     ESOPHAGOGASTRODUODENOSCOPY (EGD) WITH PROPOFOL  N/A 04/23/2023   Procedure: ESOPHAGOGASTRODUODENOSCOPY (EGD) WITH PROPOFOL ;  Surgeon: Onita Elspeth Sharper, DO;  Location: [facility] ENDOSCOPY;  Service: Gastroenterology;  Laterality: N/A;   GIVENS CAPSULE STUDY  04/26/2023   Procedure: GIVENS CAPSULE STUDY;  Surgeon: Onita Elspeth Sharper, DO;  Location: [facility] ENDOSCOPY;  Service: Gastroenterology;;   IR GENERIC HISTORICAL  01/21/2016   IR RADIOLOGIST EVAL & MGMT 01/21/2016 Marcey Moan, MD [facility] INTERV RAD   IR GENERIC HISTORICAL  10/22/2014   IR RADIOLOGIST EVAL & MGMT 10/22/2014 Marcey Moan, MD [facility] INTERV RAD   IR IMAGING GUIDED PORT INSERTION  05/28/2022   IR RADIOLOGIST EVAL & MGMT  02/02/2017   IR RADIOLOGIST EVAL & MGMT  03/08/2018   percutaneous biopsy     POLYPECTOMY  04/26/2023   Procedure: POLYPECTOMY;  Surgeon: Onita Elspeth Sharper, DO;  Location: [facility] ENDOSCOPY;  Service: Gastroenterology;;   VIDEO BRONCHOSCOPY WITH ENDOBRONCHIAL ULTRASOUND Right 05/12/2022   Procedure: VIDEO BRONCHOSCOPY WITH ENDOBRONCHIAL ULTRASOUND;  Surgeon: Tamea Dedra CROME, MD;  Location: [facility] ORS;  Service: Cardiopulmonary;  Laterality: Right;    SOCIAL HISTORY: Social History   Socioeconomic History   Marital status: Single    Spouse name: Not on file   Number of children: Not on file   Years of education: Not on file   Highest education level:  Not on file  Occupational History   Not on file  Tobacco Use   Smoking status: Former    Current packs/day: 0.00    Average packs/day: 1 pack/day for 35.0 years (35.0 ttl pk-yrs)    Types: Cigarettes    Start date: 12/21/1976    Quit date: 12/22/2011    Years since quitting: 12.3   Smokeless tobacco: Never  Vaping Use   Vaping status: Never Used  Substance and Sexual Activity   Alcohol use: Yes    Comment: occosionally    Drug use: No   Sexual activity: Not on file  Other Topics Concern   Not on file  Social History Narrative   Not on file   Social Drivers of Health   Financial Resource Strain: Low Risk  (09/19/2023)   Overall Financial Resource Strain (CARDIA)    Difficulty of Paying Living Expenses: Not hard at all  Food Insecurity: No Food Insecurity (09/19/2023)   Hunger Vital Sign    Worried About Running Out of Food in the Last Year: Never true    Ran Out of Food in the Last  Year: Never true  Transportation Needs: No Transportation Needs (09/19/2023)   PRAPARE - Administrator, Civil Service (Medical): No    Lack of Transportation (Non-Medical): No  Physical Activity: Unknown (09/19/2023)   Exercise Vital Sign    Days of Exercise per Week: 3 days    Minutes of Exercise per Session: Patient declined  Stress: No Stress Concern Present (09/19/2023)   [employer] of Occupational Health - Occupational Stress Questionnaire    Feeling of Stress : Not at all  Social Connections: Unknown (09/19/2023)   Social Connection and Isolation Panel    Frequency of Communication with Friends and Family: More than three times a week    Frequency of Social Gatherings with Friends and Family: More than three times a week    Attends Religious Services: Patient declined    Database administrator or Organizations: No    Attends Engineer, structural: Not on file    Marital Status: Never married  Intimate Partner Violence: Not At Risk (04/22/2023)   Humiliation, Afraid,  Rape, and Kick questionnaire    Fear of Current or Ex-Partner: No    Emotionally Abused: No    Physically Abused: No    Sexually Abused: No    FAMILY HISTORY: Family History  Problem Relation Age of Onset   Cancer Father    Stroke Father    Colon polyps Sister    Hearing loss Sister     ALLERGIES:  has no known allergies.  MEDICATIONS:  Current Outpatient Medications  Medication Sig Dispense Refill   albuterol  (VENTOLIN  HFA) 108 (90 Base) MCG/ACT inhaler Inhale 2 puffs into the lungs every 6 (six) hours as needed. 8 g 2   ascorbic acid  (VITAMIN C ) 500 MG tablet Take 1 tablet (500 mg total) by mouth daily. 30 tablet 1   aspirin 81 MG tablet Take 81 mg by mouth daily.     atorvastatin  (LIPITOR ) 80 MG tablet Take 1 tablet (80 mg total) by mouth daily. 90 tablet 3   budesonide -formoterol  (SYMBICORT ) 160-4.5 MCG/ACT inhaler Inhale 1 puff into the lungs 2 (two) times daily. 1 each 5   Cholecalciferol (VITAMIN D ) 2000 UNITS CAPS Take by mouth. VITAMIN D  3 2000 IU  ONE SOFTGEL DAILY  --OTC     clopidogrel  (PLAVIX ) 75 MG tablet Take 1 tablet (75 mg total) by mouth daily. 90 tablet 3   cyanocobalamin  1000 MCG tablet Take 1 tablet (1,000 mcg total) by mouth daily. 30 tablet 1   enalapril  (VASOTEC ) 10 MG tablet Take 1 tablet (10 mg total) by mouth daily. 90 tablet 0   ezetimibe  (ZETIA ) 10 MG tablet Take 1 tablet (10 mg total) by mouth daily. 90 tablet 3   iron  polysaccharides (NIFEREX) 150 MG capsule Take 1 capsule (150 mg total) by mouth daily. 30 capsule 1   pantoprazole  (PROTONIX ) 40 MG tablet Take 1 tablet (40 mg total) by mouth 2 (two) times daily before a meal. IN AM 180 tablet 3   phenytoin  (DILANTIN ) 100 MG ER capsule Take 4 capsules (400 mg total) by mouth at bedtime. 360 capsule 3   No current facility-administered medications for this visit.    PHYSICAL EXAMINATION:   Vitals:   04/20/24 1513  BP: 109/72  Pulse: 80  Resp: 18  Temp: 98 F (36.7 C)  SpO2: 97%   Filed  Weights   04/20/24 1513  Weight: 82.1 kg    Physical Exam Vitals and nursing note reviewed.  HENT:  Head: Normocephalic and atraumatic.     Mouth/Throat:     Pharynx: Oropharynx is clear.  Eyes:     Extraocular Movements: Extraocular movements intact.     Pupils: Pupils are equal, round, and reactive to light.  Cardiovascular:     Rate and Rhythm: Normal rate and regular rhythm.  Pulmonary:     Comments: Decreased breath sounds bilaterally.  Abdominal:     Palpations: Abdomen is soft.  Musculoskeletal:        General: Normal range of motion.     Cervical back: Normal range of motion.  Skin:    General: Skin is warm.  Neurological:     General: No focal deficit present.     Mental Status: He is alert and oriented to person, place, and time.  Psychiatric:        Behavior: Behavior normal.        Judgment: Judgment normal.     LABORATORY DATA:  I have reviewed the data as listed Lab Results  Component Value Date   WBC 3.9 (L) 04/20/2024   HGB 11.2 (L) 04/20/2024   HCT 34.3 (L) 04/20/2024   MCV 79.8 (L) 04/20/2024   PLT 311 04/20/2024   Recent Labs    04/21/23 2238 04/23/23 0514 07/12/23 1023 09/07/23 1100 09/20/23 1013 02/17/24 1011 04/20/24 1503  NA  --    < > 139  --  137 133* 136  K  --    < > 3.6  --  3.4* 4.3 3.7  CL  --    < > 103  --  102 101 103  CO2  --    < > 26  --  24 24 25   GLUCOSE  --    < > 96  --  84 70 101*  BUN  --    < > 9  --  9 11 10   CREATININE  --    < > 0.80   < > 0.58* 0.70 0.74  CALCIUM   --    < > 8.9  --  8.9 8.9 9.1  GFRNONAA  --    < > >60  --  >60 >60 >60  PROT 6.5   < > 7.6  --  7.6 7.7  --   ALBUMIN 3.2*   < > 3.5  --  3.8 3.8  --   AST 13*   < > 13*  --  17 24  --   ALT 12   < > 13  --  18 26  --   ALKPHOS 93   < > 100  --  100 93  --   BILITOT 0.6   < > 0.2  --  0.6 0.7  --   BILIDIR <0.1  --   --   --   --   --   --   IBILI NOT CALCULATED  --   --   --   --   --   --    < > = values in this interval not  displayed.    RADIOGRAPHIC STUDIES: I have personally reviewed the radiological images as listed and agreed with the findings in the report. No results found.    Primary cancer of right upper lobe of lung (HCC) [name] 73 y.o. 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   #RUL of  lung SCCa, atleast Stage IIIA (cT4N0M0)- -s/p bronchoscopy with EBUS by Dr. Tamea on 05/12/2022.   -Completed 5 cycles of CarboTaxol and RT on 07/27/2022. Cycle 6 was canceled due to persistent neutropenia; -Completed adjuvant Durvalumab  x 12 cycles on 07/12/2023.   CT JUNE 2025-  Scarring and radiation changes in the right upper lobe with volume loss in the right hemithorax. No recurrent or metastatic disease. Will repeat CT scan in 3 months.  CT scan ordered today.   # Iron  deficiency anemia: - Admitted from 8/29 to 04/29/2023-colonoscopy with Dr. Onita showed multiple polyps, small dark clot appearing in one of the ascending diverticuli which may have been the source of blood loss.  Upper endoscopy showed gastritis.  Pathology showed tubular adenoma.  VCE erosions in stomach that were seen on EGD.  Capsule did not reach colon. -Hemoglobin today 11  HOLD venofer -continue oral iron .   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  PSA level is normal at 3.14. -Follows with Dr. Penne   # Access - flush 2-3 months  # DISPOSITION: # HOLD venofer  today; de-access # follow up in 3 months- MD; labs/port flush- cbc/bmp; iron  studies; possible venofer ; CT chest  Dr.B    Above plan of care was discussed with patient/family in detail.  My contact information was given to the patient/family.       Cindy JONELLE Joe, MD 04/20/2024 3:37 PM

## 2024-04-20 NOTE — Progress Notes (Signed)
 Denies pain. Denies fatigue. Appetite is good. Denies any cough or dyspnea. No blood noted in stool. No dizziness. Sleeps well at night.

## 2024-04-20 NOTE — Patient Instructions (Signed)

## 2024-04-20 NOTE — Assessment & Plan Note (Addendum)
 Derek Webb 73 y.o. 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   #RUL of lung SCCa, atleast Stage IIIA (cT4N0M0)- -s/p bronchoscopy with EBUS by Dr. Tamea on 05/12/2022.   -Completed 5 cycles of CarboTaxol and RT on 07/27/2022. Cycle 6 was canceled due to persistent neutropenia; -Completed adjuvant Durvalumab  x 12 cycles on 07/12/2023.   CT JUNE 2025-  Scarring and radiation changes in the right upper lobe with volume loss in the right hemithorax. No recurrent or metastatic disease. Will repeat CT scan in 3 months.  CT scan ordered today.   # Iron  deficiency anemia: - Admitted from 8/29 to 04/29/2023-colonoscopy with Dr. Onita showed multiple polyps, small dark clot appearing in one of the ascending diverticuli which may have been the source of blood loss.  Upper endoscopy showed gastritis.  Pathology showed tubular adenoma.  VCE erosions in stomach that were seen on EGD.  Capsule did not reach colon. -Hemoglobin today 11  HOLD venofer -continue oral iron .   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  PSA level is normal at 3.14. -Follows with Dr. Penne   # Access - flush 2-3 months  # DISPOSITION: # HOLD venofer  today; de-access # follow up in 3 months- MD; labs/port flush- cbc/bmp; iron  studies; possible venofer ; CT chest  Dr.B

## 2024-04-20 NOTE — Addendum Note (Signed)
 Addended by: LAEL BROWNING A on: 04/20/2024 03:41 PM   Modules accepted: Orders

## 2024-04-23 DIAGNOSIS — J449 Chronic obstructive pulmonary disease, unspecified: Secondary | ICD-10-CM | POA: Diagnosis not present

## 2024-05-04 ENCOUNTER — Other Ambulatory Visit: Payer: Self-pay | Admitting: Pediatrics

## 2024-05-04 DIAGNOSIS — I10 Essential (primary) hypertension: Secondary | ICD-10-CM

## 2024-05-07 NOTE — Telephone Encounter (Signed)
 Requested Prescriptions  Pending Prescriptions Disp Refills   enalapril  (VASOTEC ) 10 MG tablet [Pharmacy Med Name: ENALAPRIL  MALEATE 10 MG TAB] 90 tablet 0    Sig: Take 1 tablet (10 mg total) by mouth daily.     Cardiovascular:  ACE Inhibitors Passed - 05/07/2024 11:00 AM      Passed - Cr in normal range and within 180 days    Creatinine  Date Value Ref Range Status  02/17/2024 0.70 0.61 - 1.24 mg/dL Final   Creatinine, Ser  Date Value Ref Range Status  04/20/2024 0.74 0.61 - 1.24 mg/dL Final         Passed - K in normal range and within 180 days    Potassium  Date Value Ref Range Status  04/20/2024 3.7 3.5 - 5.1 mmol/L Final         Passed - Patient is not pregnant      Passed - Last BP in normal range    BP Readings from Last 1 Encounters:  04/20/24 109/72         Passed - Valid encounter within last 6 months    Recent Outpatient Visits           4 months ago Hyperlipidemia, unspecified hyperlipidemia type   [facility] Herold Hadassah SQUIBB, MD

## 2024-05-22 ENCOUNTER — Ambulatory Visit (INDEPENDENT_AMBULATORY_CARE_PROVIDER_SITE_OTHER): Admitting: Pediatrics

## 2024-05-22 ENCOUNTER — Encounter: Payer: Self-pay | Admitting: Pediatrics

## 2024-05-22 VITALS — BP 107/71 | HR 65 | Temp 98.2°F | Wt 179.8 lb

## 2024-05-22 DIAGNOSIS — I10 Essential (primary) hypertension: Secondary | ICD-10-CM

## 2024-05-22 DIAGNOSIS — E785 Hyperlipidemia, unspecified: Secondary | ICD-10-CM

## 2024-05-22 DIAGNOSIS — R569 Unspecified convulsions: Secondary | ICD-10-CM

## 2024-05-22 DIAGNOSIS — E782 Mixed hyperlipidemia: Secondary | ICD-10-CM

## 2024-05-22 DIAGNOSIS — Z23 Encounter for immunization: Secondary | ICD-10-CM | POA: Diagnosis not present

## 2024-05-22 MED ORDER — ENALAPRIL MALEATE 10 MG PO TABS: 10.0000 mg | ORAL_TABLET | Freq: Every day | ORAL | 0 refills | Status: AC

## 2024-05-22 MED ORDER — PHENYTOIN SODIUM EXTENDED 100 MG PO CAPS: 400.0000 mg | ORAL_CAPSULE | Freq: Every day | ORAL | 3 refills | Status: AC

## 2024-05-22 MED ORDER — EZETIMIBE 10 MG PO TABS: 10.0000 mg | ORAL_TABLET | Freq: Every day | ORAL | 3 refills | Status: AC

## 2024-05-22 NOTE — Patient Instructions (Signed)
 Continue current regimen

## 2024-05-22 NOTE — Assessment & Plan Note (Signed)
 Due for labs. Continue statin and zetia .

## 2024-05-22 NOTE — Assessment & Plan Note (Signed)
 Well controlled on enalapril . Sending refills.

## 2024-05-22 NOTE — Progress Notes (Signed)
 Office Visit  BP 107/71   Pulse 65   Temp 98.2 F (36.8 C) (Oral)   Wt 179 lb 12.8 oz (81.6 kg)   SpO2 98%   BMI 27.34 kg/m    Subjective:    Patient ID: Derek Webb, male    DOB: 25-Jan-1951, 73 y.o.   MRN: [id number]  HPI: Derek Webb is a 73 y.o. male  Chief Complaint  Patient presents with   Hyperlipidemia    #HLD No concerns, taking medication as prescribed  #seizures No recent seizures taking medication as prescribed  #HTN No headaches chest pain, sob, palpations, orthopnea, dyspnea, PND, lower extremity edema.  #Neck mass Has had for years Not bothersome, has stayed the same size  Relevant past medical, surgical, family and social history reviewed and updated as indicated. Interim medical history since our last visit reviewed. Allergies and medications reviewed and updated.  ROS per HPI unless specifically indicated above     Objective:    BP 107/71   Pulse 65   Temp 98.2 F (36.8 C) (Oral)   Wt 179 lb 12.8 oz (81.6 kg)   SpO2 98%   BMI 27.34 kg/m   Wt Readings from Last 3 Encounters:  05/22/24 179 lb 12.8 oz (81.6 kg)  04/20/24 181 lb (82.1 kg)  03/09/24 178 lb 12.8 oz (81.1 kg)     Physical Exam Constitutional:      Appearance: Normal appearance.  Pulmonary:     Effort: Pulmonary effort is normal.  Musculoskeletal:        General: Normal range of motion.  Skin:    Comments: Normal skin color  Neurological:     General: No focal deficit present.     Mental Status: He is alert. Mental status is at baseline.  Psychiatric:        Mood and Affect: Mood normal.        Behavior: Behavior normal.        Thought Content: Thought content normal.         03/23/2024    1:20 PM 12/21/2023   10:49 AM 09/23/2023   10:44 AM 03/03/2023   10:37 AM 09/02/2022   10:26 AM  Depression screen PHQ 2/9  Decreased Interest 0 0 0 0 0  Down, Depressed, Hopeless 0 0 0 0 0  PHQ - 2 Score 0 0 0 0 0  Altered sleeping 0 0 0    Tired, decreased  energy 0 0 0    Change in appetite 0 0 0    Feeling bad or failure about yourself  0 0 0    Trouble concentrating 0 0 0    Moving slowly or fidgety/restless 0 0 0    Suicidal thoughts 0 0 0    PHQ-9 Score 0 0 0    Difficult doing work/chores  Not difficult at all          12/21/2023   10:50 AM 09/23/2023   10:45 AM  GAD 7 : Generalized Anxiety Score  Nervous, Anxious, on Edge 0 0  Control/stop worrying 0 0  Worry too much - different things 0 0  Trouble relaxing 0 0  Restless 0 0  Easily annoyed or irritable 0 0  Afraid - awful might happen 0 0  Total GAD 7 Score 0 0  Anxiety Difficulty Not difficult at all        Assessment & Plan:  Assessment & Plan   Essential hypertension Assessment & Plan: Well controlled  on enalapril . Sending refills.  Orders: -     Enalapril  Maleate; Take 1 tablet (10 mg total) by mouth daily.  Dispense: 90 tablet; Refill: 0  Seizures (HCC) Assessment & Plan: No recent sx. Phenytoin  refills sent.  Orders: -     Phenytoin  Sodium Extended; Take 4 capsules (400 mg total) by mouth at bedtime.  Dispense: 360 capsule; Refill: 3  Mixed hyperlipidemia Assessment & Plan: Due for labs. Continue statin and zetia .  Orders: -     Lipid panel -     Hemoglobin A1c  Hyperlipidemia, unspecified hyperlipidemia type Assessment & Plan: Due for labs. Continue statin and zetia .  Orders: -     Ezetimibe ; Take 1 tablet (10 mg total) by mouth daily.  Dispense: 90 tablet; Refill: 3  Immunization due -     Flu vaccine HIGH DOSE PF(Fluzone Trivalent)     Sent refills of what I suspect is due for refill before next visit.  Follow up plan: Return in about 6 months (around 11/19/2024) for Chronic illness f/u.  Hadassah SHAUNNA Nett, MD

## 2024-05-22 NOTE — Assessment & Plan Note (Signed)
 No recent sx. Phenytoin  refills sent.

## 2024-05-23 DIAGNOSIS — J449 Chronic obstructive pulmonary disease, unspecified: Secondary | ICD-10-CM | POA: Diagnosis not present

## 2024-05-23 LAB — LIPID PANEL
Chol/HDL Ratio: 2.4 ratio (ref 0.0–5.0)
Cholesterol, Total: 170 mg/dL (ref 100–199)
HDL: 71 mg/dL (ref >39)
LDL Chol Calc (NIH): 86 mg/dL (ref 0–99)
Triglycerides: 70 mg/dL (ref 0–149)
VLDL Cholesterol Cal: 13 mg/dL (ref 5–40)

## 2024-05-23 LAB — HEMOGLOBIN A1C
Est. average glucose Bld gHb Est-mCnc: 105 mg/dL
Hgb A1c MFr Bld: 5.3 % (ref 4.8–5.6)

## 2024-05-25 ENCOUNTER — Ambulatory Visit: Payer: Self-pay | Admitting: Pediatrics

## 2024-06-18 ENCOUNTER — Encounter

## 2024-06-18 ENCOUNTER — Ambulatory Visit: Admitting: Internal Medicine

## 2024-06-19 ENCOUNTER — Ambulatory Visit: Admitting: Pulmonary Disease

## 2024-06-19 ENCOUNTER — Encounter

## 2024-06-20 ENCOUNTER — Encounter

## 2024-06-20 ENCOUNTER — Ambulatory Visit: Admitting: Pulmonary Disease

## 2024-06-23 DIAGNOSIS — J449 Chronic obstructive pulmonary disease, unspecified: Secondary | ICD-10-CM | POA: Diagnosis not present

## 2024-06-27 ENCOUNTER — Encounter

## 2024-06-27 ENCOUNTER — Ambulatory Visit: Admitting: Pulmonary Disease

## 2024-06-28 ENCOUNTER — Encounter

## 2024-06-28 ENCOUNTER — Ambulatory Visit (INDEPENDENT_AMBULATORY_CARE_PROVIDER_SITE_OTHER)

## 2024-06-28 DIAGNOSIS — J449 Chronic obstructive pulmonary disease, unspecified: Secondary | ICD-10-CM

## 2024-06-28 LAB — PULMONARY FUNCTION TEST
FEF 25-75 Post: 0.59 L/s
FEF 25-75 Pre: 0.46 L/s
FEF2575-%Change-Post: 28 %
FEF2575-%Pred-Post: 25 %
FEF2575-%Pred-Pre: 19 %
FEV1-%Change-Post: -2 %
FEV1-%Pred-Post: 26 %
FEV1-%Pred-Pre: 26 %
FEV1-Post: 0.8 L
FEV1-Pre: 0.82 L
FEV1FVC-%Change-Post: -4 %
FEV1FVC-%Pred-Pre: 47 %
FEV6-%Change-Post: 2 %
FEV6-%Pred-Post: 60 %
FEV6-%Pred-Pre: 59 %
FEV6-Post: 2.39 L
FEV6-Pre: 2.32 L
FEV6FVC-%Change-Post: 0 %
FEV6FVC-%Pred-Post: 106 %
FEV6FVC-%Pred-Pre: 105 %
FVC-%Change-Post: 2 %
FVC-%Pred-Post: 57 %
FVC-%Pred-Pre: 55 %
FVC-Post: 2.39 L
FVC-Pre: 2.34 L
Post FEV1/FVC ratio: 33 %
Post FEV6/FVC ratio: 100 %
Pre FEV1/FVC ratio: 35 %
Pre FEV6/FVC Ratio: 99 %

## 2024-06-28 NOTE — Progress Notes (Signed)
 Spirometry pre/post completed today. Pt had difficulty with directions with multiple attempts.

## 2024-06-28 NOTE — Patient Instructions (Signed)
 Spirometry pre/post completed today.

## 2024-07-02 ENCOUNTER — Ambulatory Visit: Admitting: Pulmonary Disease

## 2024-07-03 ENCOUNTER — Ambulatory Visit (INDEPENDENT_AMBULATORY_CARE_PROVIDER_SITE_OTHER): Admitting: Pulmonary Disease

## 2024-07-03 ENCOUNTER — Encounter: Payer: Self-pay | Admitting: Pulmonary Disease

## 2024-07-03 VITALS — BP 118/62 | HR 64 | Temp 98.1°F | Ht 68.5 in | Wt 182.0 lb

## 2024-07-03 DIAGNOSIS — J439 Emphysema, unspecified: Secondary | ICD-10-CM

## 2024-07-03 DIAGNOSIS — C3490 Malignant neoplasm of unspecified part of unspecified bronchus or lung: Secondary | ICD-10-CM | POA: Diagnosis not present

## 2024-07-03 DIAGNOSIS — Z87891 Personal history of nicotine dependence: Secondary | ICD-10-CM

## 2024-07-03 DIAGNOSIS — G4734 Idiopathic sleep related nonobstructive alveolar hypoventilation: Secondary | ICD-10-CM

## 2024-07-03 DIAGNOSIS — Z8669 Personal history of other diseases of the nervous system and sense organs: Secondary | ICD-10-CM

## 2024-07-03 DIAGNOSIS — J449 Chronic obstructive pulmonary disease, unspecified: Secondary | ICD-10-CM

## 2024-07-03 NOTE — Patient Instructions (Signed)
 VISIT SUMMARY:  You had a follow-up appointment to check on your severe COPD and history of lung cancer. Your breathing test results are stable, and you are not experiencing significant shortness of breath. A follow-up scan for your lung cancer is scheduled for the 21st of this month.  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 Your condition is well-managed with your current inhaler regimen, and your breathing test results are stable. Please continue using your inhaler twice daily as prescribed. We will have a follow-up appointment in three months unless any issues arise with your upcoming scan.  -HISTORY OF SQUAMOUS CELL CARCINOMA OF THE LUNG: Squamous cell carcinoma of the lung is a type of lung cancer. You have a follow-up scan scheduled for November 21st to check the current status of your condition. We will wait for the results of this scan and reassess if any issues arise.  INSTRUCTIONS:  Please continue using your inhaler twice daily. Your next follow-up appointment is scheduled for three months from now unless any issues arise with your upcoming scan. Await the results of your lung scan scheduled for November 21st, and we will reassess based on those results.

## 2024-07-03 NOTE — Progress Notes (Unsigned)
 Subjective:    Patient ID: Derek Webb, male    DOB: August 14, 1951, 73 y.o.   MRN: [id number]  Patient Care Team: Herold Hadassah SQUIBB, MD as PCP - General (Family Medicine) Vincenzo Slough, [facility] (Inactive) as Pharmacist (Pharmacist) Verdene Gills, RN as Oncology Nurse Navigator Fernand Elfreda DELENA, MD as Consulting Physician (Internal Medicine) Rennie Cindy SAUNDERS, MD as Consulting Physician (Oncology) Lenn Aran, MD as Consulting Physician (Radiation Oncology)  Chief Complaint  Patient presents with   COPD    BACKGROUND/INTERVAL:Patient is a 73 year old former smoker who presents for follow-up of COPD management.  Patient previously seen here strictly for bronchoscopy with endobronchial ultrasound.  He underwent video bronchoscopy with endobronchial ultrasound on 12 May 2022 for a right lung mass.  This was positive for squamous cell carcinoma.  I last saw the patient on 09 February 2023 at that time with a concern for recurrent lung cancer.  However, PET/CT performed prior to that visit showed no evidence of recurrence.  Patient was seen on 14 February 2024 to formally establish care here.  No exacerbations in the interim.  He is now followed by Dr. Rennie for his oncologic issues.  HPI Discussed the use of AI scribe software for clinical note transcription with the patient, who gave verbal consent to proceed.  History of Present Illness   Derek Webb is a 73 year old male with severe COPD and a history of squamous cell carcinoma of the lung who presents for follow-up.  He presents with his sister, Rock.  Rock is his DPOA.  He has severe COPD and emphysema. He uses his inhaler twice daily, although he forgot it on the day of the visit. No significant shortness of breath is reported at this time. His breathing test results are stable compared to last year.  He has very severe COPD, PFTs performed 28 June 2024 showing FEV1 of 0.82 L or 26% predicted, FVC of 2.34 L or 55%  predicted, FEV1/FVC was 35%.  Patient was unable to perform lung volumes and DLCO due to inability to follow directions.  Compared to study performed 13 March 2024 at Dr. Kathern office there has been no significant change.  He has a history of squamous cell carcinoma of the lung. A follow-up scan has been ordered by his oncology team and is scheduled for the 21st of the month.  As noted he does not have any active complaint.  States that he is all right states that Symbicort  still is effective in managing his symptoms of shortness of breath.    Review of Systems A 10 point review of systems was performed and it is as noted above otherwise negative.   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   Primary cancer of right upper lobe of lung (HCC) 02/17/2024   Nocturnal hypoxemia 12/19/2023   Aortic dilatation 04/21/2023   Iron  deficiency anemia 04/19/2023   Seizures (HCC) 07/20/2019   GERD (gastroesophageal reflux disease) 02/08/2018   Obstructive chronic bronchitis without exacerbation (HCC) 02/08/2018   Atherosclerosis of aorta 12/18/2017   Cerebrovascular accident (CVA) (HCC) 11/01/2017   HOH (hard of hearing) 11/01/2017   Essential hypertension 11/01/2017   Hyperlipidemia 11/01/2017   Hx of adenomatous polyp of colon 12/16/2016   Squamous cell lung cancer (HCC)     Social History   Tobacco Use   Smoking status: Every Day    Current packs/day: 0.25    Average packs/day: 1 pack/day  for 47.5 years (46.9 ttl pk-yrs)    Types: Cigarettes    Start date: 12/21/1976   Smokeless tobacco: Never  Substance Use Topics   Alcohol use: Yes    Comment: occosionally     No Known Allergies  Current Meds  Medication Sig   albuterol  (VENTOLIN  HFA) 108 (90 Base) MCG/ACT inhaler Inhale 2 puffs into the lungs every 6 (six) hours as needed.   ascorbic acid  (VITAMIN C ) 500 MG tablet Take 1 tablet (500 mg total) by mouth daily.   aspirin 81 MG  tablet Take 81 mg by mouth daily.   atorvastatin  (LIPITOR ) 80 MG tablet Take 1 tablet (80 mg total) by mouth daily.   budesonide -formoterol  (SYMBICORT ) 160-4.5 MCG/ACT inhaler Inhale 1 puff into the lungs 2 (two) times daily.   Cholecalciferol (VITAMIN D ) 2000 UNITS CAPS Take by mouth. VITAMIN D  3 2000 IU  ONE SOFTGEL DAILY  --OTC   clopidogrel  (PLAVIX ) 75 MG tablet Take 1 tablet (75 mg total) by mouth daily.   cyanocobalamin  1000 MCG tablet Take 1 tablet (1,000 mcg total) by mouth daily.   enalapril  (VASOTEC ) 10 MG tablet Take 1 tablet (10 mg total) by mouth daily.   ezetimibe  (ZETIA ) 10 MG tablet Take 1 tablet (10 mg total) by mouth daily.   iron  polysaccharides (NIFEREX) 150 MG capsule Take 1 capsule (150 mg total) by mouth daily.   pantoprazole  (PROTONIX ) 40 MG tablet Take 1 tablet (40 mg total) by mouth 2 (two) times daily before a meal. IN AM   phenytoin  (DILANTIN ) 100 MG ER capsule Take 4 capsules (400 mg total) by mouth at bedtime.    Immunization History  Administered Date(s) Administered   Fluad Trivalent(High Dose 65+) 09/23/2023   INFLUENZA, HIGH DOSE SEASONAL PF 05/22/2024   Influenza Inj Mdck Quad Pf 06/21/2022   Influenza Inj Mdck Quad With Preservative 07/21/2018   Influenza Split 05/06/2012   Influenza, Mdck, Trivalent,PF 6+ MOS(egg free) 06/21/2022   Influenza,inj,quad, With Preservative 05/21/2019   Influenza-Unspecified 06/13/2017   PFIZER Comirnaty(Gray Top)Covid-19 Tri-Sucrose Vaccine 10/08/2020, 03/09/2021   PFIZER(Purple Top)SARS-COV-2 Vaccination 03/11/2020, 03/31/2020, 04/01/2020, 10/07/2020   Pfizer(Comirnaty)Fall Seasonal Vaccine 12 years and older 09/23/2023   Pneumococcal Polysaccharide-23 04/20/2020        Objective:     BP 118/62   Pulse 64   Temp 98.1 F (36.7 C) (Temporal)   Ht 5' 8.5 (1.74 m)   Wt 182 lb (82.6 kg)   SpO2 97%   BMI 27.27 kg/m   SpO2: 97 %  GENERAL: Well-developed, well-nourished gentleman, no acute distress. HEAD:  Normocephalic, atraumatic.  EYES: Pupils equal, round, reactive to light.  No scleral icterus.  MOUTH: Edentulous NECK: Supple. No thyromegaly. Trachea midline. No JVD.  No adenopathy. PULMONARY: Good air entry bilaterally.  Coarse, otherwise no adventitious sounds. CARDIOVASCULAR: S1 and S2. Regular rate and rhythm.  No rubs, murmurs or gallops heard. ABDOMEN: Benign. MUSCULOSKELETAL: No joint deformity, no clubbing, no edema.  NEUROLOGIC: No overt focal deficit, no gait disturbance, speech is fluent. SKIN: Intact,warm,dry. PSYCH: Flat affect, normal behavior.        Assessment & Plan:     ICD-10-CM   1. Stage 3 severe COPD by GOLD classification (HCC)  J44.9     2. Nocturnal hypoxemia  G47.34     3. Squamous cell carcinoma of lung, unspecified laterality (HCC)  C34.90     4. History of seizure disorder  Z86.69      Discussion:    Severe chronic obstructive pulmonary  disease with emphysema Severe COPD with emphysema, well-managed with current inhaler regimen. No significant dyspnea reported. Breathing test results are relatively stable compared to last year. - Continue current inhaler regimen (Symbicort ) twice daily - Scheduled follow-up in three months unless issues arise with upcoming scan  History of squamous cell carcinoma of the lung Awaiting results of a scan scheduled for November 21st to assess current status. - Await results of lung scan scheduled for November 21st - Will reassess if any issues arise with the scan results       Advised if symptoms do not improve or worsen, to please contact office for sooner follow up or seek emergency care.    I spent 32 minutes of dedicated to the care of this patient on the date of this encounter to include pre-visit review of records, face-to-face time with the patient discussing conditions above, post visit ordering of testing, clinical documentation with the electronic health record, making appropriate referrals as  documented, and communicating necessary findings to members of the patients care team.     C. Leita Sanders, MD Advanced Bronchoscopy PCCM [facility]    *This note was generated using voice recognition software/Dragon and/or AI transcription program.  Despite best efforts to proofread, errors can occur which can change the meaning. Any transcriptional errors that result from this process are unintentional and may not be fully corrected at the time of dictation.

## 2024-07-04 ENCOUNTER — Encounter: Payer: Self-pay | Admitting: Pulmonary Disease

## 2024-07-06 ENCOUNTER — Telehealth: Payer: Self-pay | Admitting: Internal Medicine

## 2024-07-06 NOTE — Telephone Encounter (Signed)
 Called pt to verify CT appt - left vm w/appt date/time/location - [facility]

## 2024-07-10 ENCOUNTER — Ambulatory Visit: Payer: Self-pay | Admitting: Pulmonary Disease

## 2024-07-13 ENCOUNTER — Ambulatory Visit
Admission: RE | Admit: 2024-07-13 | Discharge: 2024-07-13 | Disposition: A | Discharge status: Home / Self Care | Source: Ambulatory Visit | Attending: Internal Medicine | Admitting: Internal Medicine

## 2024-07-13 DIAGNOSIS — C3411 Malignant neoplasm of upper lobe, right bronchus or lung: Secondary | ICD-10-CM | POA: Diagnosis present

## 2024-07-13 MED ORDER — IOHEXOL 300 MG/ML  SOLN
75.0000 mL | Freq: Once | INTRAMUSCULAR | Status: AC | PRN
  Administered 2024-07-13: 75 mL via INTRAVENOUS

## 2024-08-03 ENCOUNTER — Ambulatory Visit

## 2024-08-03 ENCOUNTER — Encounter: Payer: Self-pay | Admitting: Internal Medicine

## 2024-08-03 ENCOUNTER — Inpatient Hospital Stay: Admitting: Internal Medicine

## 2024-08-03 ENCOUNTER — Inpatient Hospital Stay: Attending: Internal Medicine

## 2024-08-03 VITALS — BP 128/88 | HR 75 | Temp 98.1°F | Resp 18 | Ht 68.5 in | Wt 179.6 lb

## 2024-08-03 DIAGNOSIS — C3411 Malignant neoplasm of upper lobe, right bronchus or lung: Secondary | ICD-10-CM

## 2024-08-03 DIAGNOSIS — Z923 Personal history of irradiation: Secondary | ICD-10-CM | POA: Diagnosis not present

## 2024-08-03 DIAGNOSIS — D509 Iron deficiency anemia, unspecified: Secondary | ICD-10-CM | POA: Insufficient documentation

## 2024-08-03 DIAGNOSIS — Z85118 Personal history of other malignant neoplasm of bronchus and lung: Secondary | ICD-10-CM | POA: Diagnosis present

## 2024-08-03 DIAGNOSIS — Z9221 Personal history of antineoplastic chemotherapy: Secondary | ICD-10-CM | POA: Insufficient documentation

## 2024-08-03 LAB — BASIC METABOLIC PANEL - CANCER CENTER ONLY
Anion gap: 10 (ref 5–15)
BUN: 9 mg/dL (ref 8–23)
CO2: 27 mmol/L (ref 22–32)
Calcium: 9.2 mg/dL (ref 8.9–10.3)
Chloride: 103 mmol/L (ref 98–111)
Creatinine: 0.65 mg/dL (ref 0.61–1.24)
GFR, Estimated: >60 mL/min (ref >60)
Glucose, Bld: 99 mg/dL (ref 70–99)
Potassium: 3.8 mmol/L (ref 3.5–5.1)
Sodium: 140 mmol/L (ref 135–145)

## 2024-08-03 LAB — CBC WITH DIFFERENTIAL (CANCER CENTER ONLY)
Abs Immature Granulocytes: 0.01 K/uL (ref 0.00–0.07)
Basophils Absolute: 0.1 K/uL (ref 0.0–0.1)
Basophils Relative: 1 %
Eosinophils Absolute: 0.2 K/uL (ref 0.0–0.5)
Eosinophils Relative: 5 %
HCT: 34.6 % — ABNORMAL LOW (ref 39.0–52.0)
Hemoglobin: 11.8 g/dL — ABNORMAL LOW (ref 13.0–17.0)
Immature Granulocytes: 0 %
Lymphocytes Relative: 35 %
Lymphs Abs: 1.6 K/uL (ref 0.7–4.0)
MCH: 29.9 pg (ref 26.0–34.0)
MCHC: 34.1 g/dL (ref 30.0–36.0)
MCV: 87.8 fL (ref 80.0–100.0)
Monocytes Absolute: 0.9 K/uL (ref 0.1–1.0)
Monocytes Relative: 19 %
Neutro Abs: 1.9 K/uL (ref 1.7–7.7)
Neutrophils Relative %: 40 %
Platelet Count: 317 K/uL (ref 150–400)
RBC: 3.94 MIL/uL — ABNORMAL LOW (ref 4.22–5.81)
RDW: 14.4 % (ref 11.5–15.5)
WBC Count: 4.7 K/uL (ref 4.0–10.5)
nRBC: 0 % (ref 0.0–0.2)

## 2024-08-03 LAB — FERRITIN: Ferritin: 35 ng/mL (ref 24–336)

## 2024-08-03 LAB — IRON AND TIBC
Iron: 55 ug/dL (ref 45–182)
Saturation Ratios: 22 % (ref 17.9–39.5)
TIBC: 253 ug/dL (ref 250–450)
UIBC: 199 ug/dL

## 2024-08-03 NOTE — Progress Notes (Signed)
 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 Luverne.  He was deemed not a surgical candidate and had transportation issues for radiation treatment.  Derek Webb 73 y.o. 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   #RUL of lung SCCa, atleast Stage IIIA (cT4N0M0)- -s/p bronchoscopy with EBUS by Dr. Tamea on 05/12/2022.   -Completed 5 cycles of CarboTaxol and RT on 07/27/2022. Cycle 6 was canceled due to persistent neutropenia; -Completed adjuvant Durvalumab  x 12 cycles on 07/12/2023.     Squamous cell lung cancer (HCC)  01/12/2022 Initial Diagnosis   Patient has been following with Dr. Fernand of pulmonary for suspicious lung nodule. CT chest in March 2021 showed post ablation scarring in LUL not changed and increased atelectasis of RML and RUL. He did not follow through.   CT chest in 01/12/2022 showed  New abrupt and slightly irregular cut off of the right upper lobe bronchus with associated right upper lobe atelectasis and postobstructive changes. Findings are concerning for endobronchial malignancy    04/08/2022 PET scan   IMPRESSION: 1. Hypermetabolic central right upper lobe lung mass nearly occluding the right upper lobe bronchus, poorly delineated on the noncontrast CT images, measuring  approximately 3.8 x 2.9 cm, compatible with primary bronchogenic malignancy. 2. Postobstructive pneumonia/atelectasis throughout the basilar right upper lobe. 3. No hypermetabolic metastatic thoracic adenopathy or distant metastatic disease. 4. Stable mild post ablation change in the left upper lobe with no metabolic evidence of local tumor recurrence in this location. 5. Dilated 4.4 cm ascending thoracic aorta. Recommend annual imaging followup by CTA or MRA   05/12/2022 Procedure   Procedure was delayed due to miscommunication about holding Plavix . S/p bronchoscopy with EBUS by Dr. Tamea. Op note mentions significant bulky adenopathy in the subcarinal space and in the right hilum.    05/12/2022 Pathology Results   DIAGNOSIS:  A.  LUNG, RIGHT UPPER LOBE; ENB-ASSISTED BIOPSY:  - SQUAMOUS CELL CARCINOMA.   LUNG, RIGHT UPPER LOBE; EBUS-ASSISTED BRUSHING:  - POSITIVE FOR MALIGNANCY.  - SQUAMOUS CELL CARCINOMA IS PRESENT.   LUNG, RIGHT UPPER LOBE; EBUS-ASSISTED LAVAGE:  - POSITIVE FOR MALIGNANCY.  - SQUAMOUS CELL CARCINOMA IS PRESENT  SUBCARINAL SPACE, RIGHT; EBUS-ASSISTED FNA:  - POSITIVE FOR MALIGNANCY.  - SQUAMOUS CELL CARCINOMA IS PRESENT   05/18/2022 Cancer Staging   Staging form: Lung, AJCC 7th Edition - Clinical: Stage IIIA (T4, N0, M0) - Signed by Agrawal, Kavita, MD on 06/03/2022   05/21/2022 Imaging   MRI brain  No evidence of metastatic disease in the brain. 2. Small enhancing foci in the right parietal and right occipital calvarium, which are indeterminate but could represent small osseous metastases. Attention on follow-up   06/03/2022 - 07/27/2022 Chemotherapy   Patient is on Treatment Plan : LUNG Carboplatin  + Paclitaxel  + XRT q7d      08/03/2022 -  Chemotherapy   Patient is on Treatment Plan :  LUNG NSCLC Durvalumab  (1500) q28d       Imaging   - CT chest with contrast (11/30/2022) was reviewed at the tumor conference.  There is interval development of  marked interstitial disease and patchy airspace disease more confluent consolidative opacity in the peripheral right upper lobe progressive and interval.  In the lower lung there is ill-defined tree-in-bud nodularity with nodular areas measuring up to 1.4 cm in the posterior left lower lobe.  Features are suggestive of infectious and inflammatory etiology however disease progression cannot be excluded.  Liver lesion is stable and likely hemangioma.  Patient continues to be asymptomatic.  Cycle 6 of Durvalumab  was held.    - Repeat CT chest with contrast (01/19/2023) showed interval improvement in the diffuse interstitial lung disease seen on prior study.  But there is still some persistent interstitial patchy area of consolidative airspace in the left upper lobe.  Ill-defined nodularity in the lower lobe has largely resolved although some component of dominant nodularity persist.  There is continued progression of volume loss in the right hemithorax with consolidative opacity in the right posterior apex and suprahilar right lung.  Right upper lobe airway is no longer patent.  Could be posttreatment related versus recurrent disease.   -PET/CT (02/08/2023) showed radiation changes in the right upper and medial left upper lobe.  No suspicious findings for residual or recurrent lung cancer.  Focal left prostate hypermetabolic some raising possibility of prostate cancer.  - MRI brain with no evidence of intracranial metastasis.  Unchanged subcentimeter enhancing focus in the right parietal calvarium nonspecific.   Primary cancer of right upper lobe of lung (HCC)  02/17/2024 Initial Diagnosis   Primary cancer of right upper lobe of lung (HCC)   02/17/2024 Cancer Staging   Staging form: Lung, AJCC V9 - Clinical: Stage IIIA (cT4, cN0, cM0) - Signed by Rennie Cindy SAUNDERS, MD on 02/17/2024      HISTORY OF PRESENTING ILLNESS: Patient ambulating-independently. Accompanied by family-sister  Derek Webb 73  y.o.  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  Discussed the use of AI scribe software for clinical note transcription with the patient, who gave verbal consent to proceed.  History of Present Illness   Derek Webb is a 73 year old male with a history of right upper lobe lung cancer in remission, anemia, and a stable aneurysm who presents for routine oncology follow-up and review of recent CT imaging.  He is undergoing surveillance following treatment for right upper lobe lung cancer and reports no new symptoms, including no dyspnea, chest pain, or headache.  The patient has a history of vascular calcifications and aneurysm, which have been previously identified and for which he has seen a vascular specialist.  The patient has a history of anemia. The patient has previously received iron  infusions for anemia.      Review of Systems  Constitutional:  Negative for chills, diaphoresis, fever, malaise/fatigue and weight loss.  HENT:  Negative for nosebleeds and sore throat.   Eyes:  Negative for double vision.  Respiratory:  Negative for cough, hemoptysis, sputum production, shortness of breath and wheezing.   Cardiovascular:  Negative for chest pain, palpitations, orthopnea and leg swelling.  Gastrointestinal:  Negative for  abdominal pain, blood in stool, constipation, diarrhea, heartburn, melena, nausea and vomiting.  Genitourinary:  Negative for dysuria, frequency and urgency.  Musculoskeletal:  Negative for back pain and joint pain.  Skin: Negative.  Negative for itching and rash.  Neurological:  Negative for dizziness, tingling, focal weakness, weakness and headaches.  Endo/Heme/Allergies:   Does not bruise/bleed easily.  Psychiatric/Behavioral:  Negative for depression. The patient is not nervous/anxious and does not have insomnia.     MEDICAL HISTORY:  Past Medical History:  Diagnosis Date   Alcoholism (HCC)    Allergy    Blood transfusion without reported diagnosis    COPD (chronic obstructive pulmonary disease) (HCC)    SEVERE COPD -OCCASIONALLY USES OXYGEN  AT NIGHT-DOES NOT USE INHALERS ON REGULAR BASIS   Elevated cholesterol    GERD (gastroesophageal reflux disease)    HOH (hard of hearing)    Hypertension    Lung cancer (HCC)    LEFT UPPER LUNG CARCINOMA-NOT A CANDIDATE FOR SURGICAL RESECTION BECAUSE OF HIS COPD AND POOR CANDIDATE FOR RADIATION BECAUSE OF TRANSPORTATION PROBLEMS   Oxygen  deficiency    Seizures (HCC)    CHRONIC DILATIN - PT STATES NO SEIZURES IN PAST COUPLE OF YEARS; PT STATES HIS SEIZURES CAUSED NUMBNESS OF ARMS AND HANDS AND NOT ABLE TO MOVE HIS ARMS   Stroke (HCC)    2 TO 3 YRS AGO-AFFECTED HIS SPEECH--ABLE TO AMBULATE WITHOUT ASSIST AND DOES YARD WORK.  DECREASED HEARING IN BOTH EARS SINCE STROKE.   Vitamin D  deficiency     SURGICAL HISTORY: Past Surgical History:  Procedure Laterality Date   CARDIAC VALVE REPLACEMENT     CAROTID SURGERY 2009 -LEFT     COLON POLYP EXCISION     COLON SURGERY     COLONOSCOPY WITH PROPOFOL  N/A 04/18/2017   Procedure: COLONOSCOPY WITH PROPOFOL ;  Surgeon: Viktoria Lamar DASEN, MD;  Location: [facility] ENDOSCOPY;  Service: Endoscopy;  Laterality: N/A;   COLONOSCOPY WITH PROPOFOL  N/A 04/26/2023   Procedure: COLONOSCOPY WITH PROPOFOL ;  Surgeon: Onita Elspeth Sharper, DO;  Location[facility] ENDOSCOPY;  Service: Gastroenterology;  Laterality: N/A;   ESOPHAGOGASTRODUODENOSCOPY     ESOPHAGOGASTRODUODENOSCOPY (EGD) WITH PROPOFOL  N/A 04/23/2023   Procedure: ESOPHAGOGASTRODUODENOSCOPY (EGD) WITH PROPOFOL ;  Surgeon: Onita Elspeth Sharper, DO;  Location: [facility] ENDOSCOPY;  Service: Gastroenterology;  Laterality: N/A;   GIVENS CAPSULE  STUDY  04/26/2023   Procedure: GIVENS CAPSULE STUDY;  Surgeon: Onita Elspeth Sharper, DO;  Location: [facility] ENDOSCOPY;  Service: Gastroenterology;;   IR GENERIC HISTORICAL  01/21/2016   IR RADIOLOGIST EVAL & MGMT 01/21/2016 Marcey Moan, MD [facility] INTERV RAD   IR GENERIC HISTORICAL  10/22/2014   IR RADIOLOGIST EVAL & MGMT 10/22/2014 Marcey Moan, MD [facility] INTERV RAD   IR IMAGING GUIDED PORT INSERTION  05/28/2022   IR RADIOLOGIST EVAL & MGMT  02/02/2017   IR RADIOLOGIST EVAL & MGMT  03/08/2018   percutaneous biopsy     POLYPECTOMY  04/26/2023   Procedure: POLYPECTOMY;  Surgeon: Onita Elspeth Sharper, DO;  Location: [facility] ENDOSCOPY;  Service: Gastroenterology;;   VIDEO BRONCHOSCOPY WITH ENDOBRONCHIAL ULTRASOUND Right 05/12/2022   Procedure: VIDEO BRONCHOSCOPY WITH ENDOBRONCHIAL ULTRASOUND;  Surgeon: Tamea Dedra CROME, MD;  Location: [facility] ORS;  Service: Cardiopulmonary;  Laterality: Right;    SOCIAL HISTORY: Social History   Socioeconomic History   Marital status: Single    Spouse name: Not on file   Number of children: Not on file   Years of education: Not on file   Highest education level: Not on file  Occupational History   Not on file  Tobacco Use   Smoking status: Every Day    Current packs/day: 0.25    Average packs/day: 1 pack/day for 47.6 years (46.9 ttl pk-yrs)    Types: Cigarettes    Start date: 12/21/1976   Smokeless tobacco: Never  Vaping Use   Vaping status: Never Used  Substance and Sexual Activity   Alcohol use: Yes    Comment: occosionally    Drug use: No   Sexual activity: Not on file  Other Topics Concern   Not on file  Social History Narrative   Not on file   Social Drivers of Health   Tobacco Use: High Risk (08/03/2024)   Patient History    Smoking Tobacco Use: Every Day    Smokeless Tobacco Use: Never    Passive Exposure: Not on file  Financial Resource Strain: Low Risk (05/19/2024)   Overall Financial Resource Strain (CARDIA)    Difficulty of  Paying Living Expenses: Not hard at all  Food Insecurity: No Food Insecurity (05/19/2024)   Epic    Worried About Radiation Protection Practitioner of Food in the Last Year: Never true    Ran Out of Food in the Last Year: Never true  Transportation Needs: No Transportation Needs (05/19/2024)   Epic    Lack of Transportation (Medical): No    Lack of Transportation (Non-Medical): No  Physical Activity: Unknown (05/19/2024)   Exercise Vital Sign    Days of Exercise per Week: 5 days    Minutes of Exercise per Session: Patient declined  Stress: No Stress Concern Present (05/19/2024)   [employer] of Occupational Health - Occupational Stress Questionnaire    Feeling of Stress: Not at all  Social Connections: Socially Isolated (05/19/2024)   Social Connection and Isolation Panel    Frequency of Communication with Friends and Family: More than three times a week    Frequency of Social Gatherings with Friends and Family: More than three times a week    Attends Religious Services: Patient declined    Database Administrator or Organizations: No    Attends Engineer, Structural: Not on file    Marital Status: Never married  Intimate Partner Violence: Not At Risk (04/22/2023)   Humiliation, Afraid, Rape, and Kick questionnaire    Fear of Current or Ex-Partner: No    Emotionally Abused: No    Physically Abused: No    Sexually Abused: No  Depression (PHQ2-9): Low Risk (08/03/2024)   Depression (PHQ2-9)    PHQ-2 Score: 0  Alcohol Screen: Low Risk (09/19/2023)   Alcohol Screen    Last Alcohol Screening Score (AUDIT): 1  Housing: Low Risk (05/19/2024)   Epic    Unable to Pay for Housing in the Last Year: No    Number of Times Moved in the Last Year: 0    Homeless in the Last Year: No  Utilities: Not At Risk (04/22/2023)   AHC Utilities    Threatened with loss of utilities: No  Health Literacy: Not on file    FAMILY HISTORY: Family History  Problem Relation Age of Onset   Cancer Father    Stroke  Father    Colon polyps Sister    Hearing loss Sister     ALLERGIES:  has no known allergies.  MEDICATIONS:  Current Outpatient Medications  Medication Sig Dispense Refill   albuterol  (VENTOLIN  HFA) 108 (90 Base) MCG/ACT inhaler Inhale 2 puffs into the lungs every 6 (six) hours as needed. 8  g 2   ascorbic acid  (VITAMIN C ) 500 MG tablet Take 1 tablet (500 mg total) by mouth daily. 30 tablet 1   aspirin 81 MG tablet Take 81 mg by mouth daily.     atorvastatin  (LIPITOR ) 80 MG tablet Take 1 tablet (80 mg total) by mouth daily. 90 tablet 3   budesonide -formoterol  (SYMBICORT ) 160-4.5 MCG/ACT inhaler Inhale 1 puff into the lungs 2 (two) times daily. 1 each 5   Cholecalciferol (VITAMIN D ) 2000 UNITS CAPS Take by mouth. VITAMIN D  3 2000 IU  ONE SOFTGEL DAILY  --OTC     clopidogrel  (PLAVIX ) 75 MG tablet Take 1 tablet (75 mg total) by mouth daily. 90 tablet 3   cyanocobalamin  1000 MCG tablet Take 1 tablet (1,000 mcg total) by mouth daily. 30 tablet 1   enalapril  (VASOTEC ) 10 MG tablet Take 1 tablet (10 mg total) by mouth daily. 90 tablet 0   ezetimibe  (ZETIA ) 10 MG tablet Take 1 tablet (10 mg total) by mouth daily. 90 tablet 3   iron  polysaccharides (NIFEREX) 150 MG capsule Take 1 capsule (150 mg total) by mouth daily. 30 capsule 1   pantoprazole  (PROTONIX ) 40 MG tablet Take 1 tablet (40 mg total) by mouth 2 (two) times daily before a meal. IN AM 180 tablet 3   phenytoin  (DILANTIN ) 100 MG ER capsule Take 4 capsules (400 mg total) by mouth at bedtime. 360 capsule 3   No current facility-administered medications for this visit.    PHYSICAL EXAMINATION:   Vitals:   08/03/24 1450  BP: 128/88  Pulse: 75  Resp: 18  Temp: 98.1 F (36.7 C)  SpO2: 96%   Filed Weights   08/03/24 1450  Weight: 179 lb 9.6 oz (81.5 kg)    Physical Exam Vitals and nursing note reviewed.  HENT:     Head: Normocephalic and atraumatic.     Mouth/Throat:     Pharynx: Oropharynx is clear.  Eyes:     Extraocular  Movements: Extraocular movements intact.     Pupils: Pupils are equal, round, and reactive to light.  Cardiovascular:     Rate and Rhythm: Normal rate and regular rhythm.  Pulmonary:     Comments: Decreased breath sounds bilaterally.  Abdominal:     Palpations: Abdomen is soft.  Musculoskeletal:        General: Normal range of motion.     Cervical back: Normal range of motion.  Skin:    General: Skin is warm.  Neurological:     General: No focal deficit present.     Mental Status: He is alert and oriented to person, place, and time.  Psychiatric:        Behavior: Behavior normal.        Judgment: Judgment normal.     LABORATORY DATA:  I have reviewed the data as listed Lab Results  Component Value Date   WBC 4.7 08/03/2024   HGB 11.8 (L) 08/03/2024   HCT 34.6 (L) 08/03/2024   MCV 87.8 08/03/2024   PLT 317 08/03/2024   Recent Labs    09/20/23 1013 02/17/24 1011 04/20/24 1503 08/03/24 1445  NA 137 133* 136 140  K 3.4* 4.3 3.7 3.8  CL 102 101 103 103  CO2 24 24 25 27   GLUCOSE 84 70 101* 99  BUN 9 11 10 9   CREATININE 0.58* 0.70 0.74 0.65  CALCIUM  8.9 8.9 9.1 9.2  GFRNONAA >60 >60 >60 >60  PROT 7.6 7.7  --   --  ALBUMIN 3.8 3.8  --   --   AST 17 24  --   --   ALT 18 26  --   --   ALKPHOS 100 93  --   --   BILITOT 0.6 0.7  --   --     RADIOGRAPHIC STUDIES: I have personally reviewed the radiological images as listed and agreed with the findings in the report. CT CHEST W CONTRAST Result Date: 07/20/2024 EXAM: CT CHEST WITH CONTRAST 07/13/2024 02:35:29 PM TECHNIQUE: CT of the chest was performed with the administration of 75 mL of iohexol  (OMNIPAQUE ) 300 MG/ML solution. Multiplanar reformatted images are provided for review. Automated exposure control, iterative reconstruction, and/or weight based adjustment of the mA/kV was utilized to reduce the radiation dose to as low as reasonably achievable. COMPARISON: 02/03/2024 CLINICAL HISTORY: lung cancer restaging . *  Tracking Code: BO * FINDINGS: MEDIASTINUM: Heart and pericardium are unremarkable. Left anterior descending coronary atherosclerosis. Right internal jugular port-a-cath terminates at the cavoatrial junction. Atherosclerotic thoracic aorta with dilated 4.1 cm ascending thoracic aorta, stable. Dilated main pulmonary artery 3.8 cm diameter. Chronic near occlusive filling defect in the right upper lobe pulmonary artery branches, unchanged. The central airways are clear. LYMPH NODES: No mediastinal, hilar or axillary lymphadenopathy. LUNGS AND PLEURA: Moderate paraseptal and centrilobular emphysema with diffuse bronchial wall thickening. Sharply marginated large geographic region of consolidation in the perihilar upper right lung with associated volume loss and distortion, unchanged and compatible with radiation fibrosis. Thick curvilinear bandlike chronic consolidation in the left upper lobe with associated mild volume loss and distortion compatible with post treatment change. Stable 0.3 cm anterior left lower lobe nodule (series 3, image 63). No acute interval consolidative airspace disease. No new significant pulmonary nodules. No pleural effusion or pneumothorax. SOFT TISSUES/BONES: Moderate thoracic spondylosis. No acute abnormality of the soft tissues. UPPER ABDOMEN: Limited images of the upper abdomen demonstrate: 1.6 cm lateral segment left liver hyperenhancing focus (series 2, image 121) stable back to at least 03/02/2018 chest CT, compatible with a benign lesion, likely a hemangioma. Irregular contour of the bilateral adrenal glands is unchanged since 2019 chest CT and considered benign. Subcentimeter low attenuation splenic lesion is too small to characterize and unchanged, considered benign. IMPRESSION: 1. Stable posttreatment changes in the upper lungs without evidence of local tumor recurrence. 2. No evidence of metastatic disease in the chest. 3. 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 4. Aortic Atherosclerosis (ICD10-I70.0). 5. Emphysema (ICD10-J43.9). Electronically signed by: Selinda Blue MD 07/20/2024 10:36 AM EST RP Workstation: [id number]      Primary cancer of right upper lobe of lung (HCC) Derek Webb 73 y.o. 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   #RUL of lung SCCa, atleast Stage IIIA (cT4N0M0) [Dr.Gonzalez]- s/p chemo- RT -s/p adjuvant Durvalumab  x 12 cycles on 07/12/2023.    # NOV 21st, 2025- 1. Stable posttreatment changes in the upper lungs without evidence of local tumor recurrence;  No evidence of metastatic disease in the chest.  Will repeat a scan again in 4 months.  Ordered today.   # Iron  deficiency anemia: - Admitted from 8/29 to 04/29/2023-colonoscopy with Dr. Onita showed multiple polyps, small dark clot appearing in one of the ascending diverticuli  which may have been the source of blood loss.  Upper endoscopy showed gastritis.  Pathology showed tubular adenoma.  VCE erosions in stomach that were seen on EGD.  Capsule did not reach colon. -Hemoglobin today 11  HOLD venofer -continue oral iron .  Stable hemoglobin.   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  PSA level is normal at 3.14. -Follows with Dr. Penne  # Dilated ascending thoracic aorta measuring 4.1 cm-status post evaluation with vascular surgery.  Follow up with Dr.Dew.   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    # Access - flush 2-3 months  # DISPOSITION: # HOLD venofer  today; de-access #  port flush in 2 months-  # follow up in 3 months- MD; labs/port flush- cbc/bmp; iron  studies;ferritin- possible venofer ; CT chest  Dr.B  # I reviewed the blood work- with the patient in detail; also reviewed the imaging independently [as summarized above]; and with the patient in detail.      Above plan of care was discussed with patient/family in detail.  My contact information was given to the patient/family.      Cindy JONELLE Joe, MD 08/03/2024 3:53 PM

## 2024-08-03 NOTE — Progress Notes (Signed)
 No concerns today

## 2024-08-03 NOTE — Assessment & Plan Note (Addendum)
 Derek Webb 73 y.o. 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   #RUL of lung SCCa, atleast Stage IIIA (cT4N0M0) [Dr.Gonzalez]- s/p chemo- RT -s/p adjuvant Durvalumab  x 12 cycles on 07/12/2023.    # NOV 21st, 2025- 1. Stable posttreatment changes in the upper lungs without evidence of local tumor recurrence;  No evidence of metastatic disease in the chest.  Will repeat a scan again in 4 months.  Ordered today.   # Iron  deficiency anemia: - Admitted from 8/29 to 04/29/2023-colonoscopy with Dr. Onita showed multiple polyps, small dark clot appearing in one of the ascending diverticuli which may have been the source of blood loss.  Upper endoscopy showed gastritis.  Pathology showed tubular adenoma.  VCE erosions in stomach that were seen on EGD.  Capsule did not reach colon. -Hemoglobin today 11  HOLD venofer -continue oral iron .  Stable hemoglobin.   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  PSA level is normal at 3.14. -Follows with Dr. Penne  # Dilated ascending thoracic aorta measuring 4.1 cm-status post evaluation with vascular surgery.  Follow up with Dr.Dew.   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    # Access - flush 2-3 months  # DISPOSITION: # HOLD venofer  today; de-access # port flush in 2 months-  # follow up in 3 months- MD; labs/port flush- cbc/bmp; iron  studies;ferritin- possible venofer ; CT chest  Dr.B  # I reviewed the blood work- with the patient in detail; also reviewed the imaging independently [as summarized above]; and with the patient in detail.

## 2024-09-04 ENCOUNTER — Encounter: Payer: Self-pay | Admitting: Internal Medicine

## 2024-09-27 ENCOUNTER — Encounter: Payer: Self-pay | Admitting: Internal Medicine

## 2024-10-04 ENCOUNTER — Ambulatory Visit: Admitting: Pulmonary Disease

## 2024-10-08 ENCOUNTER — Inpatient Hospital Stay

## 2024-10-25 ENCOUNTER — Ambulatory Visit

## 2024-11-02 ENCOUNTER — Inpatient Hospital Stay

## 2024-11-02 ENCOUNTER — Inpatient Hospital Stay: Admitting: Internal Medicine

## 2024-11-23 ENCOUNTER — Ambulatory Visit: Admitting: Pediatrics

## 2024-11-28 ENCOUNTER — Encounter: Admitting: Nurse Practitioner

## 2025-03-08 ENCOUNTER — Ambulatory Visit (INDEPENDENT_AMBULATORY_CARE_PROVIDER_SITE_OTHER): Admitting: Vascular Surgery
